# Patient Record
Sex: Female | Born: 1950 | Race: Black or African American | Hispanic: No | Marital: Married | Smoking: Former smoker
Health system: Southern US, Community
[De-identification: ages and names within clinical notes are randomized; demographics above are authoritative.]

## PROBLEM LIST (undated history)

## (undated) ENCOUNTER — Emergency Department (HOSPITAL_COMMUNITY): Payer: Medicare Other

## (undated) DIAGNOSIS — M1990 Unspecified osteoarthritis, unspecified site: Secondary | ICD-10-CM

## (undated) DIAGNOSIS — I10 Essential (primary) hypertension: Secondary | ICD-10-CM

## (undated) DIAGNOSIS — E119 Type 2 diabetes mellitus without complications: Secondary | ICD-10-CM

## (undated) HISTORY — PX: EYE SURGERY: SHX253

---

## 1998-08-13 ENCOUNTER — Encounter: Admission: RE | Admit: 1998-08-13 | Discharge: 1998-08-13 | Payer: Self-pay | Admitting: *Deleted

## 1998-09-18 ENCOUNTER — Other Ambulatory Visit: Admission: RE | Admit: 1998-09-18 | Discharge: 1998-09-18 | Payer: Self-pay | Admitting: *Deleted

## 1998-11-25 ENCOUNTER — Ambulatory Visit (HOSPITAL_COMMUNITY): Admission: RE | Admit: 1998-11-25 | Discharge: 1998-11-25 | Payer: Self-pay | Admitting: *Deleted

## 1998-11-25 ENCOUNTER — Encounter: Payer: Self-pay | Admitting: *Deleted

## 1999-10-06 ENCOUNTER — Other Ambulatory Visit: Admission: RE | Admit: 1999-10-06 | Discharge: 1999-10-06 | Payer: Self-pay | Admitting: *Deleted

## 2000-01-21 ENCOUNTER — Encounter: Payer: Self-pay | Admitting: *Deleted

## 2000-01-21 ENCOUNTER — Ambulatory Visit (HOSPITAL_COMMUNITY): Admission: RE | Admit: 2000-01-21 | Discharge: 2000-01-21 | Payer: Self-pay | Admitting: *Deleted

## 2001-02-02 ENCOUNTER — Encounter: Payer: Self-pay | Admitting: Unknown Physician Specialty

## 2001-02-02 ENCOUNTER — Ambulatory Visit (HOSPITAL_COMMUNITY): Admission: RE | Admit: 2001-02-02 | Discharge: 2001-02-02 | Payer: Self-pay | Admitting: Unknown Physician Specialty

## 2001-07-31 ENCOUNTER — Other Ambulatory Visit: Admission: RE | Admit: 2001-07-31 | Discharge: 2001-07-31 | Payer: Self-pay | Admitting: *Deleted

## 2002-09-26 ENCOUNTER — Encounter: Admission: RE | Admit: 2002-09-26 | Discharge: 2002-09-26 | Payer: Self-pay | Admitting: Psychiatry

## 2002-10-01 ENCOUNTER — Encounter: Admission: RE | Admit: 2002-10-01 | Discharge: 2002-10-01 | Payer: Self-pay | Admitting: Psychiatry

## 2002-11-05 ENCOUNTER — Encounter: Admission: RE | Admit: 2002-11-05 | Discharge: 2002-11-05 | Payer: Self-pay | Admitting: Psychiatry

## 2002-11-21 ENCOUNTER — Encounter: Admission: RE | Admit: 2002-11-21 | Discharge: 2002-11-21 | Payer: Self-pay | Admitting: Psychiatry

## 2004-09-29 ENCOUNTER — Ambulatory Visit (HOSPITAL_COMMUNITY): Admission: RE | Admit: 2004-09-29 | Discharge: 2004-09-29 | Payer: Self-pay | Admitting: Internal Medicine

## 2007-03-31 ENCOUNTER — Ambulatory Visit (HOSPITAL_COMMUNITY): Admission: RE | Admit: 2007-03-31 | Discharge: 2007-03-31 | Payer: Self-pay | Admitting: Family Medicine

## 2008-04-02 ENCOUNTER — Ambulatory Visit (HOSPITAL_COMMUNITY): Admission: RE | Admit: 2008-04-02 | Discharge: 2008-04-02 | Payer: Self-pay | Admitting: Family Medicine

## 2008-06-27 ENCOUNTER — Emergency Department (HOSPITAL_COMMUNITY): Admission: EM | Admit: 2008-06-27 | Discharge: 2008-06-27 | Payer: Self-pay | Admitting: Emergency Medicine

## 2009-04-03 ENCOUNTER — Ambulatory Visit (HOSPITAL_COMMUNITY): Admission: RE | Admit: 2009-04-03 | Discharge: 2009-04-03 | Payer: Self-pay | Admitting: Family Medicine

## 2010-05-11 ENCOUNTER — Ambulatory Visit (HOSPITAL_COMMUNITY): Admission: RE | Admit: 2010-05-11 | Discharge: 2010-05-11 | Payer: Self-pay | Admitting: Internal Medicine

## 2011-06-24 ENCOUNTER — Other Ambulatory Visit (HOSPITAL_COMMUNITY): Payer: Self-pay | Admitting: *Deleted

## 2011-06-24 DIAGNOSIS — Z1231 Encounter for screening mammogram for malignant neoplasm of breast: Secondary | ICD-10-CM

## 2011-07-07 ENCOUNTER — Ambulatory Visit (HOSPITAL_COMMUNITY)
Admission: RE | Admit: 2011-07-07 | Discharge: 2011-07-07 | Disposition: A | Discharge status: Home / Self Care | Source: Ambulatory Visit | Attending: Family Medicine | Admitting: Family Medicine

## 2011-07-07 DIAGNOSIS — Z1231 Encounter for screening mammogram for malignant neoplasm of breast: Secondary | ICD-10-CM

## 2011-09-15 ENCOUNTER — Emergency Department (HOSPITAL_COMMUNITY)
Admission: EM | Admit: 2011-09-15 | Discharge: 2011-09-15 | Disposition: A | Discharge status: Home / Self Care | Attending: Emergency Medicine | Admitting: Emergency Medicine

## 2011-09-15 DIAGNOSIS — M25569 Pain in unspecified knee: Secondary | ICD-10-CM | POA: Insufficient documentation

## 2011-09-15 DIAGNOSIS — I10 Essential (primary) hypertension: Secondary | ICD-10-CM | POA: Insufficient documentation

## 2011-09-15 DIAGNOSIS — E7800 Pure hypercholesterolemia, unspecified: Secondary | ICD-10-CM | POA: Insufficient documentation

## 2011-09-15 DIAGNOSIS — E119 Type 2 diabetes mellitus without complications: Secondary | ICD-10-CM | POA: Insufficient documentation

## 2012-08-07 ENCOUNTER — Other Ambulatory Visit (HOSPITAL_COMMUNITY): Payer: Self-pay | Admitting: Family Medicine

## 2012-08-07 DIAGNOSIS — Z1231 Encounter for screening mammogram for malignant neoplasm of breast: Secondary | ICD-10-CM

## 2012-08-22 ENCOUNTER — Ambulatory Visit (HOSPITAL_COMMUNITY): Attending: Family Medicine

## 2012-09-15 ENCOUNTER — Ambulatory Visit (HOSPITAL_COMMUNITY)
Admission: RE | Admit: 2012-09-15 | Discharge: 2012-09-15 | Disposition: A | Discharge status: Home / Self Care | Source: Ambulatory Visit | Attending: Family Medicine | Admitting: Family Medicine

## 2012-09-15 DIAGNOSIS — Z1231 Encounter for screening mammogram for malignant neoplasm of breast: Secondary | ICD-10-CM | POA: Insufficient documentation

## 2013-08-07 ENCOUNTER — Other Ambulatory Visit (HOSPITAL_COMMUNITY): Payer: Self-pay | Admitting: *Deleted

## 2013-08-07 DIAGNOSIS — Z1231 Encounter for screening mammogram for malignant neoplasm of breast: Secondary | ICD-10-CM

## 2013-09-17 ENCOUNTER — Ambulatory Visit (HOSPITAL_COMMUNITY)
Admission: RE | Admit: 2013-09-17 | Discharge: 2013-09-17 | Disposition: A | Discharge status: Home / Self Care | Source: Ambulatory Visit | Attending: Family Medicine | Admitting: Family Medicine

## 2013-09-17 DIAGNOSIS — Z1231 Encounter for screening mammogram for malignant neoplasm of breast: Secondary | ICD-10-CM

## 2013-12-12 DIAGNOSIS — E785 Hyperlipidemia, unspecified: Secondary | ICD-10-CM | POA: Insufficient documentation

## 2013-12-12 DIAGNOSIS — I10 Essential (primary) hypertension: Secondary | ICD-10-CM | POA: Insufficient documentation

## 2014-09-16 ENCOUNTER — Other Ambulatory Visit: Payer: Self-pay | Admitting: Family Medicine

## 2014-09-16 DIAGNOSIS — Z1231 Encounter for screening mammogram for malignant neoplasm of breast: Secondary | ICD-10-CM

## 2014-09-24 ENCOUNTER — Ambulatory Visit (HOSPITAL_COMMUNITY)
Admission: RE | Admit: 2014-09-24 | Discharge: 2014-09-24 | Disposition: A | Discharge status: Home / Self Care | Source: Ambulatory Visit | Attending: Family Medicine | Admitting: Family Medicine

## 2014-09-24 DIAGNOSIS — Z1231 Encounter for screening mammogram for malignant neoplasm of breast: Secondary | ICD-10-CM | POA: Diagnosis not present

## 2014-12-16 DIAGNOSIS — Z720 Tobacco use: Secondary | ICD-10-CM | POA: Insufficient documentation

## 2015-03-14 ENCOUNTER — Emergency Department (HOSPITAL_COMMUNITY)

## 2015-03-14 ENCOUNTER — Emergency Department (HOSPITAL_COMMUNITY)
Admission: EM | Admit: 2015-03-14 | Discharge: 2015-03-14 | Disposition: A | Discharge status: Home / Self Care | Attending: Emergency Medicine | Admitting: Emergency Medicine

## 2015-03-14 ENCOUNTER — Encounter (HOSPITAL_COMMUNITY): Payer: Self-pay | Admitting: Emergency Medicine

## 2015-03-14 DIAGNOSIS — Z720 Tobacco use: Secondary | ICD-10-CM | POA: Insufficient documentation

## 2015-03-14 DIAGNOSIS — J320 Chronic maxillary sinusitis: Secondary | ICD-10-CM | POA: Diagnosis not present

## 2015-03-14 DIAGNOSIS — Z79899 Other long term (current) drug therapy: Secondary | ICD-10-CM | POA: Insufficient documentation

## 2015-03-14 DIAGNOSIS — Z8739 Personal history of other diseases of the musculoskeletal system and connective tissue: Secondary | ICD-10-CM | POA: Diagnosis not present

## 2015-03-14 DIAGNOSIS — I10 Essential (primary) hypertension: Secondary | ICD-10-CM | POA: Insufficient documentation

## 2015-03-14 DIAGNOSIS — Z7951 Long term (current) use of inhaled steroids: Secondary | ICD-10-CM | POA: Diagnosis not present

## 2015-03-14 DIAGNOSIS — R079 Chest pain, unspecified: Secondary | ICD-10-CM | POA: Diagnosis not present

## 2015-03-14 DIAGNOSIS — E119 Type 2 diabetes mellitus without complications: Secondary | ICD-10-CM | POA: Diagnosis not present

## 2015-03-14 HISTORY — DX: Type 2 diabetes mellitus without complications: E11.9

## 2015-03-14 HISTORY — DX: Unspecified osteoarthritis, unspecified site: M19.90

## 2015-03-14 HISTORY — DX: Essential (primary) hypertension: I10

## 2015-03-14 LAB — I-STAT TROPONIN, ED: Troponin i, poc: 0 ng/mL (ref 0.00–0.08)

## 2015-03-14 LAB — BASIC METABOLIC PANEL
Anion gap: 7 (ref 5–15)
BUN: 16 mg/dL (ref 6–23)
CO2: 24 mmol/L (ref 19–32)
Calcium: 8.8 mg/dL (ref 8.4–10.5)
Chloride: 104 mmol/L (ref 96–112)
Creatinine, Ser: 0.73 mg/dL (ref 0.50–1.10)
GFR calc Af Amer: >90 mL/min (ref >90)
GFR calc non Af Amer: 88 mL/min — ABNORMAL LOW (ref >90)
Glucose, Bld: 170 mg/dL — ABNORMAL HIGH (ref 70–99)
Potassium: 3.8 mmol/L (ref 3.5–5.1)
Sodium: 135 mmol/L (ref 135–145)

## 2015-03-14 LAB — CBC
HCT: 38.7 % (ref 36.0–46.0)
Hemoglobin: 12.1 g/dL (ref 12.0–15.0)
MCH: 27.3 pg (ref 26.0–34.0)
MCHC: 31.3 g/dL (ref 30.0–36.0)
MCV: 87.4 fL (ref 78.0–100.0)
Platelets: 261 10*3/uL (ref 150–400)
RBC: 4.43 MIL/uL (ref 3.87–5.11)
RDW: 14.8 % (ref 11.5–15.5)
WBC: 5.5 10*3/uL (ref 4.0–10.5)

## 2015-03-14 MED ORDER — FAMOTIDINE 20 MG PO TABS: 20.0000 mg | ORAL_TABLET | Freq: Two times a day (BID) | ORAL | Status: DC

## 2015-03-14 MED ORDER — AMOXICILLIN 500 MG PO CAPS: 1000.0000 mg | ORAL_CAPSULE | Freq: Two times a day (BID) | ORAL | Status: DC

## 2015-03-14 NOTE — ED Notes (Addendum)
Pt c/o central chest pain radiating to abdomen and back starting around 0730 this morning. Describes pain as "burning." Denies GERD but says she has "gas all the time." C/o dizziness, lightheadedness, "pain in right ear going to right eye" associated with the chest pain. Not on blood thinners, denies cardiac hx/DVT/strokes. Denies SOB with chest pain or currently. Denies N/V/D. Patient is in NAD. RR even/unlabored.

## 2015-03-14 NOTE — ED Provider Notes (Signed)
CSN: [id number]     Arrival date & time 03/14/15  1146 History   First MD Initiated Contact with Patient 03/14/15 1703     Chief Complaint  Patient presents with  . Chest Pain     (Consider location/radiation/quality/duration/timing/severity/associated sxs/prior Treatment) HPI Comments: The patient is a 64 year old female who has had several episodes of a burning in her chest today. This lasts approximately 1 minute or less, occurs when she is sitting, does not occur with exertion and of note the patient has no history of exertional symptoms despite walking frequently and writing the exercise bike at the gym frequently. She will exercise for up to 45 minutes without any symptoms. She has never seen a cardiologist, she does have hypertension, hypercholesterolemia and mild diabetes. She used to smoke cigarettes but stopped about 3 weeks ago, she denies swelling of the lower extremities or any other complaints, she is not having any symptoms at this time.  Patient is a 64 y.o. female presenting with chest pain. The history is provided by the patient.  Chest Pain   Past Medical History  Diagnosis Date  . Diabetes mellitus without complication   . Hypertension   . Arthritis    Past Surgical History  Procedure Laterality Date  . Eye surgery     History reviewed. No pertinent family history. History  Substance Use Topics  . Smoking status: Current Every Day Smoker -- 0.45 packs/day    Types: Cigarettes  . Smokeless tobacco: Not on file  . Alcohol Use: No   OB History    No data available     Review of Systems  Cardiovascular: Positive for chest pain.  All other systems reviewed and are negative.     Allergies  Aspirin; Azithromycin; and Codeine  Home Medications   Prior to Admission medications   Medication Sig Start Date End Date Taking? Authorizing Provider  amLODipine (NORVASC) 10 MG tablet Take 10 mg by mouth daily.   Yes Historical Provider, MD  cetirizine (ZYRTEC)  10 MG chewable tablet Chew 10 mg by mouth daily.   Yes Historical Provider, MD  Difluprednate 0.05 % EMUL Place 1 drop into the right eye 3 (three) times daily.   Yes Historical Provider, MD  ezetimibe (ZETIA) 10 MG tablet Take 10 mg by mouth at bedtime.   Yes Historical Provider, MD  fluticasone (FLONASE) 50 MCG/ACT nasal spray Place 1 spray into both nostrils daily.   Yes Historical Provider, MD  glipiZIDE-metformin (METAGLIP) 2.5-500 MG per tablet Take 2 tablets by mouth 2 (two) times daily before a meal.   Yes Historical Provider, MD  latanoprost (XALATAN) 0.005 % ophthalmic solution Place 1 drop into both eyes at bedtime.   Yes Historical Provider, MD  pioglitazone (ACTOS) 30 MG tablet Take 30 mg by mouth daily.   Yes Historical Provider, MD  quinapril (ACCUPRIL) 40 MG tablet Take 40 mg by mouth daily.   Yes Historical Provider, MD  simvastatin (ZOCOR) 20 MG tablet Take 20 mg by mouth at bedtime.   Yes Historical Provider, MD  amoxicillin (AMOXIL) 500 MG capsule Take 2 capsules (1,000 mg total) by mouth 2 (two) times daily. 03/14/15   Noemi Chapel, MD  famotidine (PEPCID) 20 MG tablet Take 1 tablet (20 mg total) by mouth 2 (two) times daily. 03/14/15   Noemi Chapel, MD   BP 150/67 mmHg  Pulse 75  Temp(Src) 97.9 F (36.6 C) (Oral)  Resp 16  SpO2 99% Physical Exam  Constitutional: She appears well-developed and  well-nourished. No distress.  HENT:  Head: Normocephalic and atraumatic.  Mouth/Throat: Oropharynx is clear and moist. No oropharyngeal exudate.  Mild tenderness to palpation over maxillary and frontal sinuses  Eyes: Conjunctivae and EOM are normal. Pupils are equal, round, and reactive to light. Right eye exhibits no discharge. Left eye exhibits no discharge. No scleral icterus.  Neck: Normal range of motion. Neck supple. No JVD present. No thyromegaly present.  Cardiovascular: Normal rate, regular rhythm, normal heart sounds and intact distal pulses.  Exam reveals no gallop and no  friction rub.   No murmur heard. Pulmonary/Chest: Effort normal and breath sounds normal. No respiratory distress. She has no wheezes. She has no rales.  Abdominal: Soft. Bowel sounds are normal. She exhibits no distension and no mass. There is no tenderness.  Musculoskeletal: Normal range of motion. She exhibits no edema or tenderness.  Lymphadenopathy:    She has no cervical adenopathy.  Neurological: She is alert. Coordination normal.  Skin: Skin is warm and dry. No rash noted. No erythema.  Psychiatric: She has a normal mood and affect. Her behavior is normal.  Nursing note and vitals reviewed.   ED Course  Procedures (including critical care time) Labs Review Labs Reviewed  BASIC METABOLIC PANEL - Abnormal; Notable for the following:    Glucose, Bld 170 (*)    GFR calc non Af Amer 88 (*)    All other components within normal limits  CBC  I-STAT TROPOININ, ED    Imaging Review Dg Chest 2 View  03/14/2015   CLINICAL DATA:  Mid chest pain radiating to the abdomen and back which began approximately 5 hr ago, described as a burning pain. Associated dizziness. Current history of hypertension and diabetes. Current smoker.  EXAM: CHEST  2 VIEW  COMPARISON:  None.  FINDINGS: Cardiac silhouette normal in size. Thoracic aorta atherosclerotic. Hilar and mediastinal contours otherwise unremarkable. Mildly prominent bronchovascular markings diffusely and mild central peribronchial thickening. Lungs otherwise clear. No localized airspace consolidation. No pleural effusions. No pneumothorax. Normal pulmonary vascularity. Visualized bony thorax intact.  IMPRESSION: Mild changes of bronchitis and/or asthma which may be acute or chronic. No acute cardiopulmonary disease otherwise.   Electronically Signed   By: Evangeline Dakin M.D.   On: 03/14/2015 13:17     EKG Interpretation   Date/Time:  Friday March 14 2015 11:59:32 EDT Ventricular Rate:  79 PR Interval:  172 QRS Duration: 80 QT Interval:   400 QTC Calculation: 458 R Axis:   84 Text Interpretation:  Normal sinus rhythm Normal ECG No old tracing to  compare Confirmed by Zakir Henner  MD, [place] ([id number]) on 03/14/2015 5:07:25 PM      MDM   Final diagnoses:  Chest pain, unspecified chest pain type  Maxillary sinusitis, unspecified chronicity    The patient is very well-appearing, very normal EKG, vital signs are totally unremarkable. Labs showed no signs of elevation of troponin, no signs of ischemia, no renal dysfunction and no anemia. The patient may have an aspect of acid reflux, we'll start on Pepcid, will also start on baby aspirin as this is nonspecific chest pain and she has multiple risk factors for heart disease. She can follow-up in the community setting, she will have her doctor refer her to cardiologist, I will also give further cardiologist phone number. She also likely has some sinusitis. She already takes Zyrtec and Flonase   Meds given in ED:  Medications - No data to display  Discharge Medication List as of 03/14/2015  6:05 PM    START taking these medications   Details  amoxicillin (AMOXIL) 500 MG capsule Take 2 capsules (1,000 mg total) by mouth 2 (two) times daily., Starting 03/14/2015, Until Discontinued, Print    famotidine (PEPCID) 20 MG tablet Take 1 tablet (20 mg total) by mouth 2 (two) times daily., Starting 03/14/2015, Until Discontinued, Print            Noemi Chapel, MD 03/16/15 [phone]

## 2015-03-14 NOTE — Discharge Instructions (Signed)
Take aspirin daily (81mg ).  pepcid twice daily  Call your doctor for referral to cardiology for stress test if you continue to have chest pain but return to the ER if it gets worse.

## 2015-04-23 DIAGNOSIS — R809 Proteinuria, unspecified: Secondary | ICD-10-CM | POA: Insufficient documentation

## 2015-04-23 DIAGNOSIS — E1129 Type 2 diabetes mellitus with other diabetic kidney complication: Secondary | ICD-10-CM | POA: Insufficient documentation

## 2016-01-20 ENCOUNTER — Other Ambulatory Visit: Payer: Self-pay

## 2016-01-20 DIAGNOSIS — Z1231 Encounter for screening mammogram for malignant neoplasm of breast: Secondary | ICD-10-CM

## 2016-01-30 ENCOUNTER — Ambulatory Visit
Admission: RE | Admit: 2016-01-30 | Discharge: 2016-01-30 | Disposition: A | Discharge status: Home / Self Care | Payer: Medicare Other | Source: Ambulatory Visit

## 2016-01-30 DIAGNOSIS — Z1231 Encounter for screening mammogram for malignant neoplasm of breast: Secondary | ICD-10-CM

## 2016-11-10 DIAGNOSIS — N952 Postmenopausal atrophic vaginitis: Secondary | ICD-10-CM | POA: Insufficient documentation

## 2016-11-17 ENCOUNTER — Other Ambulatory Visit: Payer: Self-pay | Admitting: Family Medicine

## 2016-11-17 DIAGNOSIS — E2839 Other primary ovarian failure: Secondary | ICD-10-CM

## 2016-11-24 ENCOUNTER — Ambulatory Visit: Payer: Medicare Other | Admitting: *Deleted

## 2017-02-02 ENCOUNTER — Encounter: Payer: Medicare Other | Attending: Family Medicine | Admitting: Registered"

## 2017-02-02 DIAGNOSIS — E119 Type 2 diabetes mellitus without complications: Secondary | ICD-10-CM

## 2017-02-02 DIAGNOSIS — Z6826 Body mass index (BMI) 26.0-26.9, adult: Secondary | ICD-10-CM | POA: Diagnosis not present

## 2017-02-02 DIAGNOSIS — Z713 Dietary counseling and surveillance: Secondary | ICD-10-CM | POA: Insufficient documentation

## 2017-02-02 NOTE — Patient Instructions (Signed)
Plan:  Continue to eat 3 meals plus snack each day Aim for 2-3 Carb Choices per meal (15-30 grams) +/- 1 either way  Aim for 0-1 Carbs per snack if hungry  Include lean protein in moderation with your meals and snacks Be mindful of portion sizes and pay attention to hunger and satiety cues  Read nutrition labels and choose foods with whole grains listed as first ingredient. Limit saturated fats Continue checking BG as directed by MD  Continue taking Metformin as directed by MD  Doristine Devoid choice to cut back on smoking with goal of quitting

## 2017-02-02 NOTE — Progress Notes (Signed)
Diabetes Self-Management Education  Visit Type: First/Initial  Appt. Start Time: 0930 Appt. End Time: 1100  02/02/2017  Ms. Lindsey Williamson, identified by name and date of birth, is a 66 y.o. female with a diagnosis of Diabetes: Type 2.   ASSESSMENT Patient reports that she would like to know what foods and how much she can eat. She is afraid of low blood sugar. She reports that her physician recently changed her medication from glipizine-metformin to straight metformin which I believe should help with reducing her symptoms of low blood sugar.   Height '5\' 6"'$  (1.676 m), weight 163 lb 9.6 oz (74.2 kg). Body mass index is 26.41 kg/m.      Diabetes Self-Management Education - 02/02/17 0947      Visit Information   Visit Type First/Initial     Initial Visit   Diabetes Type Type 2   Are you currently following a meal plan? No   Are you taking your medications as prescribed? Yes   Date Diagnosed 1985     Health Coping   How would you rate your overall health? Fair     Psychosocial Assessment   Patient Belief/Attitude about Diabetes Afraid   What is the last grade level you completed in school? 33IR     Complications   Last HgB A1C per patient/outside source 7 %  per pt   How often do you check your blood sugar? 1-2 times/day  1x/day before breakfast   Fasting Blood glucose range (mg/dL) 70-129   Number of hypoglycemic episodes per month 3   Can you tell when your blood sugar is low? No   What do you do if your blood sugar is low? glass of OJ   Number of hyperglycemic episodes per week 3   Can you tell when your blood sugar is high? Yes  dizzy   What do you do if your blood sugar is high? water   Have you had a dilated eye exam in the past 12 months? Yes   Have you had a dental exam in the past 12 months? No   Are you checking your feet? Yes   How many days per week are you checking your feet? 5     Dietary Intake   Breakfast 2 eggs, bacon, coffee w splenda and  sugar-free hazelnut creamer OR waffle and [place] bacon or sausage coffee   Snack (morning) none   Lunch left overs OR tuna fish, and onions, celery   Snack (afternoon) chips OR popcorn   Secretary/administrator (with skin), green vegetable OR fish with peppers, tomato, mushrooms   Snack (evening) popcorn  gets hungry at 10:30 pm   Beverage(s) water, 1 c coffee, occassionally diet pepsi     Exercise   Exercise Type ADL's   How many days per week to you exercise? 0   How many minutes per day do you exercise? 0   Total minutes per week of exercise 0     Patient Education   Previous Diabetes Education No   Nutrition management  Role of diet in the treatment of diabetes and the relationship between the three main macronutrients and blood glucose level;Carbohydrate counting;Meal options for control of blood glucose level and chronic complications.     Individualized Goals (developed by patient)   Nutrition General guidelines for healthy choices and portions discussed;Follow meal plan discussed   Reducing Risk stop smoking     Outcomes   Expected Outcomes Demonstrated interest in learning. Expect positive  outcomes   Future DMSE 4-6 wks   Program Status Not Completed      Individualized Plan for Diabetes Self-Management Training:   Learning Objective:  Patient will have a greater understanding of diabetes self-management. Patient education plan is to attend individual and/or group sessions per assessed needs and concerns.   Plan:   Patient Instructions  Plan:  Continue to eat 3 meals plus snack each day Aim for 2-3 Carb Choices per meal (15-30 grams) +/- 1 either way  Aim for 0-1 Carbs per snack if hungry  Include lean protein in moderation with your meals and snacks Be mindful of portion sizes and pay attention to hunger and satiety cues  Read nutrition labels and choose foods with whole grains listed as first ingredient. Limit saturated fats Continue checking BG as directed by MD   Continue taking Metformin as directed by MD  Great choice to cut back on smoking with goal of quitting     Expected Outcomes:  Demonstrated interest in learning. Expect positive outcomes  Education material provided: Meal plan card, My Plate, Snack sheet and Carbohydrate counting sheet  If problems or questions, patient to contact team via:  Phone and Email  Future DSME appointment: 4-6 wks

## 2017-02-24 ENCOUNTER — Other Ambulatory Visit: Payer: Self-pay | Admitting: Family Medicine

## 2017-02-24 DIAGNOSIS — Z1231 Encounter for screening mammogram for malignant neoplasm of breast: Secondary | ICD-10-CM

## 2017-03-16 ENCOUNTER — Ambulatory Visit: Payer: Medicare Other | Admitting: Registered"

## 2017-03-22 ENCOUNTER — Ambulatory Visit
Admission: RE | Admit: 2017-03-22 | Discharge: 2017-03-22 | Disposition: A | Discharge status: Home / Self Care | Payer: Medicare Other | Source: Ambulatory Visit | Attending: Family Medicine | Admitting: Family Medicine

## 2017-03-22 ENCOUNTER — Ambulatory Visit: Payer: Medicare Other

## 2017-03-22 DIAGNOSIS — E2839 Other primary ovarian failure: Secondary | ICD-10-CM

## 2017-03-22 DIAGNOSIS — Z1231 Encounter for screening mammogram for malignant neoplasm of breast: Secondary | ICD-10-CM

## 2017-05-09 DIAGNOSIS — M8580 Other specified disorders of bone density and structure, unspecified site: Secondary | ICD-10-CM | POA: Insufficient documentation

## 2017-11-11 DIAGNOSIS — M1990 Unspecified osteoarthritis, unspecified site: Secondary | ICD-10-CM | POA: Insufficient documentation

## 2018-03-16 ENCOUNTER — Other Ambulatory Visit: Payer: Self-pay | Admitting: Family Medicine

## 2018-03-16 DIAGNOSIS — Z139 Encounter for screening, unspecified: Secondary | ICD-10-CM

## 2018-04-13 ENCOUNTER — Ambulatory Visit
Admission: RE | Admit: 2018-04-13 | Discharge: 2018-04-13 | Disposition: A | Discharge status: Home / Self Care | Payer: Medicare Other | Source: Ambulatory Visit | Attending: Family Medicine | Admitting: Family Medicine

## 2018-04-13 DIAGNOSIS — Z139 Encounter for screening, unspecified: Secondary | ICD-10-CM

## 2018-09-25 DIAGNOSIS — J329 Chronic sinusitis, unspecified: Secondary | ICD-10-CM | POA: Insufficient documentation

## 2018-09-25 DIAGNOSIS — G8929 Other chronic pain: Secondary | ICD-10-CM | POA: Insufficient documentation

## 2018-10-16 DIAGNOSIS — J343 Hypertrophy of nasal turbinates: Secondary | ICD-10-CM | POA: Insufficient documentation

## 2018-10-16 DIAGNOSIS — G500 Trigeminal neuralgia: Secondary | ICD-10-CM | POA: Insufficient documentation

## 2019-05-24 ENCOUNTER — Other Ambulatory Visit: Payer: Self-pay | Admitting: Family Medicine

## 2019-05-24 DIAGNOSIS — Z1231 Encounter for screening mammogram for malignant neoplasm of breast: Secondary | ICD-10-CM

## 2019-07-10 ENCOUNTER — Ambulatory Visit: Payer: Medicare Other

## 2019-08-24 DIAGNOSIS — M79672 Pain in left foot: Secondary | ICD-10-CM | POA: Insufficient documentation

## 2019-09-04 ENCOUNTER — Other Ambulatory Visit: Payer: Self-pay

## 2019-09-04 ENCOUNTER — Ambulatory Visit
Admission: RE | Admit: 2019-09-04 | Discharge: 2019-09-04 | Disposition: A | Discharge status: Home / Self Care | Payer: Medicare Other | Source: Ambulatory Visit | Attending: Family Medicine | Admitting: Family Medicine

## 2019-09-04 DIAGNOSIS — Z1231 Encounter for screening mammogram for malignant neoplasm of breast: Secondary | ICD-10-CM

## 2019-12-18 DIAGNOSIS — F330 Major depressive disorder, recurrent, mild: Secondary | ICD-10-CM | POA: Insufficient documentation

## 2020-01-28 ENCOUNTER — Encounter: Payer: Self-pay | Admitting: Allergy and Immunology

## 2020-01-28 ENCOUNTER — Ambulatory Visit (INDEPENDENT_AMBULATORY_CARE_PROVIDER_SITE_OTHER): Payer: Medicare Other | Admitting: Allergy and Immunology

## 2020-01-28 ENCOUNTER — Other Ambulatory Visit: Payer: Self-pay

## 2020-01-28 VITALS — BP 162/84 | HR 82 | Temp 97.6°F | Resp 18 | Ht 65.0 in | Wt 181.6 lb

## 2020-01-28 DIAGNOSIS — J3089 Other allergic rhinitis: Secondary | ICD-10-CM | POA: Diagnosis not present

## 2020-01-28 DIAGNOSIS — R05 Cough: Secondary | ICD-10-CM | POA: Diagnosis not present

## 2020-01-28 DIAGNOSIS — R059 Cough, unspecified: Secondary | ICD-10-CM | POA: Insufficient documentation

## 2020-01-28 DIAGNOSIS — H1013 Acute atopic conjunctivitis, bilateral: Secondary | ICD-10-CM | POA: Diagnosis not present

## 2020-01-28 DIAGNOSIS — H1010 Acute atopic conjunctivitis, unspecified eye: Secondary | ICD-10-CM | POA: Insufficient documentation

## 2020-01-28 MED ORDER — OLOPATADINE HCL 0.1 % OP SOLN: 1.0000 [drp] | Freq: Two times a day (BID) | OPHTHALMIC | 1 refills | Status: DC

## 2020-01-28 MED ORDER — MONTELUKAST SODIUM 10 MG PO TABS: 10.0000 mg | ORAL_TABLET | Freq: Every day | ORAL | 1 refills | Status: DC

## 2020-01-28 MED ORDER — AZELASTINE-FLUTICASONE 137-50 MCG/ACT NA SUSP: 1.0000 | Freq: Two times a day (BID) | NASAL | 1 refills | Status: DC

## 2020-01-28 MED ORDER — GUAIFENESIN ER 600 MG PO TB12: ORAL_TABLET | ORAL | 1 refills | Status: DC

## 2020-01-28 NOTE — Addendum Note (Signed)
Addended by: Chip Boer R on: 01/28/2020 05:11 PM   Modules accepted: Orders

## 2020-01-28 NOTE — Assessment & Plan Note (Signed)
The patient's history and physical examination suggest upper airway cough syndrome.  Spirometry today reveals normal ventilatory function. We will aggressively treat postnasal drainage and evaluate results.  Treatment plan as outlined above.  Tobacco cessation has been discussed and encouraged.  If the coughing persists or progresses despite this plan, we will evaluate further.

## 2020-01-28 NOTE — Assessment & Plan Note (Signed)
   Aeroallergen avoidance measures have been discussed and provided in written form.  A prescription has been provided for azelastine/fluticasone nasal spray, 1 spray per nostril twice daily as needed. Proper nasal spray technique has been discussed and demonstrated.  Nasal saline lavage (NeilMed) has been recommended as needed and prior to medicated nasal sprays along with instructions for proper administration.  For thick post nasal drainage, add guaifenesin [phone] mg (Mucinex)  twice daily as needed with adequate hydration as discussed.  Saved benefit from cetirizine (Zyrtec) or montelukast (Singulair) these medications may be discontinued.

## 2020-01-28 NOTE — Assessment & Plan Note (Signed)
   Treatment plan as outlined above for allergic rhinitis.  A prescription has been provided for Pataday, one drop per eye daily as needed.  I have also recommended eye lubricant drops (i.e., Natural Tears) as needed. 

## 2020-01-28 NOTE — Progress Notes (Signed)
New Patient Note  RE: Lindsey Williamson MRN: [id number] DOB: May 11, 1951 Date of Office Visit: 01/28/2020  Referring provider: Katherina Mires, MD Primary care provider: Katherina Mires, MD  Chief Complaint: Allergic Rhinitis  and Cough   History of present illness: Lindsey Williamson is a 69 y.o. female seen today in consultation requested by Lindsey Obey, MD.  She complains of nasal congestion, rhinorrhea, sneezing fits, thick postnasal drainage, nasal pruritus, ocular pruritus, lacrimation, and sinus pressure.  She states that the postnasal drainage often times irritates her throat causing coughing.  These symptoms occur year around but are most frequent and severe when the heat comes on in her apartment.  She describes the cough is nonproductive and worse at nighttime.  She denies chest tightness, dyspnea, and wheezing.  She has a 15-pack-year history and is currently attempting to cut back on cigarette smoking.  She reports that she has been taking fluticasone nasal spray, montelukast, and cetirizine "forever" without perceived benefit.  She was treated with antibiotics for a sinus infection this past week.  She has no history of symptoms consistent with eczema or food allergies.   Assessment and plan: Allergic rhinitis with a predominant nonallergic component.  Aeroallergen avoidance measures have been discussed and provided in written form.  A prescription has been provided for azelastine/fluticasone nasal spray, 1 spray per nostril twice daily as needed. Proper nasal spray technique has been discussed and demonstrated.  Nasal saline lavage (NeilMed) has been recommended as needed and prior to medicated nasal sprays along with instructions for proper administration.  For thick post nasal drainage, add guaifenesin [phone] mg (Mucinex)  twice daily as needed with adequate hydration as discussed.  Saved benefit from cetirizine (Zyrtec) or montelukast (Singulair) these medications may be  discontinued.  Allergic conjunctivitis  Treatment plan as outlined above for allergic rhinitis.  A prescription has been provided for Pataday, one drop per eye daily as needed.  I have also recommended eye lubricant drops (i.e., Natural Tears) as needed.  Cough The patient's history and physical examination suggest upper airway cough syndrome.  Spirometry today reveals normal ventilatory function. We will aggressively treat postnasal drainage and evaluate results.  Treatment plan as outlined above.  Tobacco cessation has been discussed and encouraged.  If the coughing persists or progresses despite this plan, we will evaluate further.   Meds ordered this encounter  Medications  . Azelastine-Fluticasone 137-50 MCG/ACT SUSP    Sig: Place 1 spray into the nose 2 (two) times daily.    Dispense:  69 g    Refill:  1  . olopatadine (PATANOL) 0.1 % ophthalmic solution    Sig: Place 1 drop into both eyes 2 (two) times daily.    Dispense:  15 mL    Refill:  1  . montelukast (SINGULAIR) 10 MG tablet    Sig: Take 1 tablet (10 mg total) by mouth at bedtime.    Dispense:  90 tablet    Refill:  1  . guaiFENesin (MUCINEX) 600 MG 12 hr tablet    Sig: 1-2 tablets twice daily as needed.  Please be sure to drink plenty of liquids with this medication.    Dispense:  180 tablet    Refill:  1    Diagnostics: Spirometry:  Normal with an FEV1 of 82% predicted and an FEV1 ratio of 110%. Please see scanned spirometry results for details. Epicutaneous testing: Negative despite a positive histamine control. Intradermal testing: Borderline positive to ragweed mix, cat hair, and  dust mite antigen.  Physical examination: Blood pressure (!) 162/84, pulse 82, temperature 97.6 F (36.4 C), temperature source Temporal, resp. rate 18, height 5\' 5"  (1.651 m), weight 181 lb 9.6 oz (82.4 kg), SpO2 97 %.  General: Alert, interactive, in no acute distress. HEENT: TMs pearly gray, turbinates moderately  edematous with clear discharge, post-pharynx moderately erythematous. Neck: Supple without lymphadenopathy. Lungs: Clear to auscultation without wheezing, rhonchi or rales. CV: Normal S1, S2 without murmurs. Abdomen: Nondistended, nontender. Skin: Warm and dry, without lesions or rashes. Extremities:  No clubbing, cyanosis or edema. Neuro:   Grossly intact.  Review of systems:  Review of systems negative except as noted in HPI / PMHx or noted below: Review of Systems  Constitutional: Negative.   HENT: Negative.   Eyes: Negative.   Respiratory: Negative.   Cardiovascular: Negative.   Gastrointestinal: Negative.   Genitourinary: Negative.   Musculoskeletal: Negative.   Skin: Negative.   Neurological: Negative.   Endo/Heme/Allergies: Negative.   Psychiatric/Behavioral: Negative.     Past medical history:  Past Medical History:  Diagnosis Date  . Arthritis   . Diabetes mellitus without complication ([place])   . Hypertension     Past surgical history:  Past Surgical History:  Procedure Laterality Date  . EYE SURGERY      Family history: History reviewed. No pertinent family history.  Social history: Social History   Socioeconomic History  . Marital status: Married    Spouse name: Not on file  . Number of children: Not on file  . Years of education: Not on file  . Highest education level: Not on file  Occupational History  . Not on file  Tobacco Use  . Smoking status: Current Every Day Smoker    Packs/day: 0.45    Types: Cigarettes  . Smokeless tobacco: Never Used  Substance and Sexual Activity  . Alcohol use: No  . Drug use: Never  . Sexual activity: Not on file  Other Topics Concern  . Not on file  Social History Narrative  . Not on file   Social Determinants of Health   Financial Resource Strain:   . Difficulty of Paying Living Expenses: Not on file  Food Insecurity:   . Worried About Charity fundraiser in the Last Year: Not on file  . Ran Out of  Food in the Last Year: Not on file  Transportation Needs:   . Lack of Transportation (Medical): Not on file  . Lack of Transportation (Non-Medical): Not on file  Physical Activity:   . Days of Exercise per Week: Not on file  . Minutes of Exercise per Session: Not on file  Stress:   . Feeling of Stress : Not on file  Social Connections:   . Frequency of Communication with Friends and Family: Not on file  . Frequency of Social Gatherings with Friends and Family: Not on file  . Attends Religious Services: Not on file  . Active Member of Clubs or Organizations: Not on file  . Attends Archivist Meetings: Not on file  . Marital Status: Not on file  Intimate Partner Violence:   . Fear of Current or Ex-Partner: Not on file  . Emotionally Abused: Not on file  . Physically Abused: Not on file  . Sexually Abused: Not on file    Environmental History: The patient lives in a 70 year old apartment with carpeting throughout and central air/heat.  There is no known mold/water damage in the home.  There are no pets  in the home.  He smokes cigarettes with a 15-pack-year history.  Current Outpatient Medications  Medication Sig Dispense Refill  . amLODipine (NORVASC) 10 MG tablet Take 10 mg by mouth daily.    . cetirizine (ZYRTEC) 10 MG chewable tablet Chew 10 mg by mouth daily.    . Difluprednate 0.05 % EMUL Place 1 drop into the right eye 3 (three) times daily.    Marland Kitchen ezetimibe (ZETIA) 10 MG tablet Take 10 mg by mouth at bedtime.    . famotidine (PEPCID) 20 MG tablet Take 1 tablet (20 mg total) by mouth 2 (two) times daily. 30 tablet 0  . fluticasone (FLONASE) 50 MCG/ACT nasal spray Place 1 spray into both nostrils daily.    . meloxicam (MOBIC) 15 MG tablet Take by mouth.    . metFORMIN (GLUCOPHAGE) 1000 MG tablet Take 1,000 mg by mouth 2 (two) times daily with a meal.    . nortriptyline (PAMELOR) 10 MG capsule Take by mouth.    Marland Kitchen omeprazole (PRILOSEC) 20 MG capsule     . pioglitazone  (ACTOS) 30 MG tablet Take 30 mg by mouth daily.    . pravastatin (PRAVACHOL) 40 MG tablet Take 40 mg by mouth daily.    . quinapril (ACCUPRIL) 40 MG tablet Take 40 mg by mouth daily.    . simvastatin (ZOCOR) 20 MG tablet Take 20 mg by mouth at bedtime.    . Azelastine-Fluticasone 137-50 MCG/ACT SUSP Place 1 spray into the nose 2 (two) times daily. 69 g 1  . guaiFENesin (MUCINEX) 600 MG 12 hr tablet 1-2 tablets twice daily as needed.  Please be sure to drink plenty of liquids with this medication. 180 tablet 1  . montelukast (SINGULAIR) 10 MG tablet Take 1 tablet (10 mg total) by mouth at bedtime. 90 tablet 1  . olopatadine (PATANOL) 0.1 % ophthalmic solution Place 1 drop into both eyes 2 (two) times daily. 15 mL 1   No current facility-administered medications for this visit.    Known medication allergies: Allergies  Allergen Reactions  . Aspirin Palpitations  . Azithromycin Palpitations  . Codeine Palpitations    I appreciate the opportunity to take part in Jermya's care. Please do not hesitate to contact me with questions.  Sincerely,   R. Edgar Frisk, MD

## 2020-01-28 NOTE — Patient Instructions (Addendum)
Allergic rhinitis with a predominant nonallergic component.  Aeroallergen avoidance measures have been discussed and provided in written form.  A prescription has been provided for azelastine/fluticasone nasal spray, 1 spray per nostril twice daily as needed. Proper nasal spray technique has been discussed and demonstrated.  Nasal saline lavage (NeilMed) has been recommended as needed and prior to medicated nasal sprays along with instructions for proper administration.  For thick post nasal drainage, add guaifenesin [phone] mg (Mucinex)  twice daily as needed with adequate hydration as discussed.  Saved benefit from cetirizine (Zyrtec) or montelukast (Singulair) these medications may be discontinued.  Allergic conjunctivitis  Treatment plan as outlined above for allergic rhinitis.  A prescription has been provided for Pataday, one drop per eye daily as needed.  I have also recommended eye lubricant drops (i.e., Natural Tears) as needed.  Cough The patient's history and physical examination suggest upper airway cough syndrome.  Spirometry today reveals normal ventilatory function. We will aggressively treat postnasal drainage and evaluate results.  Treatment plan as outlined above.  Tobacco cessation has been discussed and encouraged.  If the coughing persists or progresses despite this plan, we will evaluate further.   Return in about 3 months (around 04/26/2020), or if symptoms worsen or fail to improve.  Control of House Dust Mite Allergen  House dust mites play a major role in allergic asthma and rhinitis.  They occur in environments with high humidity wherever human skin, the food for dust mites is found. High levels have been detected in dust obtained from mattresses, pillows, carpets, upholstered furniture, bed covers, clothes and soft toys.  The principal allergen of the house dust mite is found in its feces.  A gram of dust may contain 1,000 mites and 250,000 fecal  particles.  Mite antigen is easily measured in the air during house cleaning activities.    1. Encase mattresses, including the box spring, and pillow, in an air tight cover.  Seal the zipper end of the encased mattresses with wide adhesive tape. 2. Wash the bedding in water of 130 degrees Farenheit weekly.  Avoid cotton comforters/quilts and flannel bedding: the most ideal bed covering is the dacron comforter. 3. Remove all upholstered furniture from the bedroom. 4. Remove carpets, carpet padding, rugs, and non-washable window drapes from the bedroom.  Wash drapes weekly or use plastic window coverings. 5. Remove all non-washable stuffed toys from the bedroom.  Wash stuffed toys weekly. 6. Have the room cleaned frequently with a vacuum cleaner and a damp dust-mop.  The patient should not be in a room which is being cleaned and should wait 1 hour after cleaning before going into the room. 7. Close and seal all heating outlets in the bedroom.  Otherwise, the room will become filled with dust-laden air.  An electric heater can be used to heat the room. 8. Reduce indoor humidity to less than 50%.  Do not use a humidifier.  Reducing Pollen Exposure  The American Academy of Allergy, Asthma and Immunology suggests the following steps to reduce your exposure to pollen during allergy seasons.    1. Do not hang sheets or clothing out to dry; pollen may collect on these items. 2. Do not mow lawns or spend time around freshly cut grass; mowing stirs up pollen. 3. Keep windows closed at night.  Keep car windows closed while driving. 4. Minimize morning activities outdoors, a time when pollen counts are usually at their highest. 5. Stay indoors as much as possible when pollen counts  or humidity is high and on windy days when pollen tends to remain in the air longer. 6. Use air conditioning when possible.  Many air conditioners have filters that trap the pollen spores. 7. Use a HEPA room air filter to remove  pollen form the indoor air you breathe.   Control of Dog or Cat Allergen  Avoidance is the best way to manage a dog or cat allergy. If you have a dog or cat and are allergic to dog or cats, consider removing the dog or cat from the home. If you have a dog or cat but don't want to find it a new home, or if your family wants a pet even though someone in the household is allergic, here are some strategies that may help keep symptoms at bay:  1. Keep the pet out of your bedroom and restrict it to only a few rooms. Be advised that keeping the dog or cat in only one room will not limit the allergens to that room. 2. Don't pet, hug or kiss the dog or cat; if you do, wash your hands with soap and water. 3. High-efficiency particulate air (HEPA) cleaners run continuously in a bedroom or living room can reduce allergen levels over time. 4. Place electrostatic material sheet in the air inlet vent in the bedroom. 5. Regular use of a high-efficiency vacuum cleaner or a central vacuum can reduce allergen levels. 6. Giving your dog or cat a bath at least once a week can reduce airborne allergen.

## 2020-01-29 MED ORDER — FLUTICASONE PROPIONATE 50 MCG/ACT NA SUSP: 2.0000 | Freq: Two times a day (BID) | NASAL | 5 refills | Status: DC | PRN

## 2020-01-29 MED ORDER — AZELASTINE HCL 0.1 % NA SOLN: 2.0000 | Freq: Two times a day (BID) | NASAL | 5 refills | Status: DC | PRN

## 2020-01-29 NOTE — Addendum Note (Signed)
Addended by: Herbie Drape on: 01/29/2020 10:19 AM   Modules accepted: Orders

## 2020-01-30 ENCOUNTER — Other Ambulatory Visit: Payer: Self-pay | Admitting: *Deleted

## 2020-02-17 ENCOUNTER — Ambulatory Visit: Payer: Medicare Other | Attending: Internal Medicine

## 2020-02-17 DIAGNOSIS — Z23 Encounter for immunization: Secondary | ICD-10-CM | POA: Insufficient documentation

## 2020-02-17 NOTE — Progress Notes (Signed)
   Covid-19 Vaccination Clinic  Name:  Lindsey Williamson    MRN: [id number] DOB: 1951-04-21  02/17/2020  [name] was observed post Covid-19 immunization for 15 minutes without incidence. She was provided with Vaccine Information Sheet and instruction to access the V-Safe system.   [name] was instructed to call 911 with any severe reactions post vaccine: Marland Kitchen Difficulty breathing  . Swelling of your face and throat  . A fast heartbeat  . A bad rash all over your body  . Dizziness and weakness    Immunizations Administered    Name Date Dose VIS Date Route   Pfizer COVID-19 Vaccine 02/17/2020  9:23 AM 0.3 mL 12/07/2019 Intramuscular   Manufacturer: [employer]   Lot: [id number]   [place]: [address]-7

## 2020-03-12 ENCOUNTER — Ambulatory Visit: Payer: Medicare Other | Attending: Internal Medicine

## 2020-03-12 DIAGNOSIS — Z23 Encounter for immunization: Secondary | ICD-10-CM

## 2020-03-12 NOTE — Progress Notes (Signed)
   Covid-19 Vaccination Clinic  Name:  Lindsey Williamson    MRN: [id number] DOB: October 29, 1951  03/12/2020  Lindsey Williamson was observed post Covid-19 immunization for 15 minutes without incident. She was provided with Vaccine Information Sheet and instruction to access the V-Safe system.   Lindsey Williamson was instructed to call 911 with any severe reactions post vaccine: Marland Kitchen Difficulty breathing  . Swelling of face and throat  . A fast heartbeat  . A bad rash all over body  . Dizziness and weakness   Immunizations Administered    Name Date Dose VIS Date Route   Pfizer COVID-19 Vaccine 03/12/2020  9:12 AM 0.3 mL 12/07/2019 Intramuscular   Manufacturer: [employer]   Lot: [id number]   [place]: [address]-3

## 2020-04-21 ENCOUNTER — Other Ambulatory Visit: Payer: Self-pay | Admitting: Family Medicine

## 2020-04-21 DIAGNOSIS — M8580 Other specified disorders of bone density and structure, unspecified site: Secondary | ICD-10-CM

## 2020-04-28 ENCOUNTER — Encounter: Payer: Self-pay | Admitting: Allergy and Immunology

## 2020-04-28 ENCOUNTER — Other Ambulatory Visit: Payer: Self-pay

## 2020-04-28 ENCOUNTER — Ambulatory Visit (INDEPENDENT_AMBULATORY_CARE_PROVIDER_SITE_OTHER): Payer: Medicare Other | Admitting: Allergy and Immunology

## 2020-04-28 VITALS — BP 130/82 | HR 76 | Temp 97.1°F | Resp 16

## 2020-04-28 DIAGNOSIS — J3089 Other allergic rhinitis: Secondary | ICD-10-CM | POA: Diagnosis not present

## 2020-04-28 DIAGNOSIS — H1013 Acute atopic conjunctivitis, bilateral: Secondary | ICD-10-CM | POA: Diagnosis not present

## 2020-04-28 DIAGNOSIS — R05 Cough: Secondary | ICD-10-CM | POA: Diagnosis not present

## 2020-04-28 DIAGNOSIS — R059 Cough, unspecified: Secondary | ICD-10-CM

## 2020-04-28 MED ORDER — FLUTICASONE PROPIONATE 50 MCG/ACT NA SUSP: 1.0000 | Freq: Every day | NASAL | 5 refills | Status: DC | PRN

## 2020-04-28 MED ORDER — AZELASTINE HCL 0.1 % NA SOLN: 2.0000 | Freq: Two times a day (BID) | NASAL | 5 refills | Status: DC | PRN

## 2020-04-28 MED ORDER — MUCINEX 600 MG PO TB12: ORAL_TABLET | ORAL | 1 refills | Status: DC

## 2020-04-28 NOTE — Progress Notes (Signed)
Follow-up Note  RE: HOLY BATTENFIELD MRN: [id number] DOB: 1951/11/12 Date of Office Visit: 04/28/2020  Primary care provider: Katherina Mires, MD Referring provider: Katherina Mires, MD  History of present illness: Lindsey Williamson is a 69 y.o. female with mixed rhinoconjunctivitis and history of persistent cough presenting today for follow-up.  She was previously seen in this clinic for her initial evaluation on January 28, 2020.  She reports that in the interval since her previous visit her nasal and ocular symptoms have improved.  Particularly in comparison with last spring, her allergy symptoms are well controlled.  She reports she still experiences occasional cough from postnasal drainage, however states that over-the-counter Mucinex is too expensive.  Assessment and plan: Allergic rhinitis with a predominant nonallergic component.  Continue appropriate aeroallergen avoidance measures  Continue azelastine/fluticasone nasal spray, 1 spray per nostril twice daily as needed.   Continue nasal saline lavage (NeilMed) as needed.  A prescription has been provided for guaifenesin 400 mg every 4-6 hours if needed.  Cough Upper airway cough syndrome.  Treatment plan as outlined above.   Meds ordered this encounter  Medications  . fluticasone (FLONASE) 50 MCG/ACT nasal spray    Sig: Place 1-2 sprays into both nostrils daily as needed for allergies or rhinitis.    Dispense:  16 g    Refill:  5  . azelastine (ASTELIN) 0.1 % nasal spray    Sig: Place 2 sprays into both nostrils 2 (two) times daily as needed for rhinitis.    Dispense:  30 mL    Refill:  5  . guaiFENesin (MUCINEX) 600 MG 12 hr tablet    Sig: 1-2 tablets twice daily as needed.  Please be sure to drink plenty of liquids with this medication.    Dispense:  180 tablet    Refill:  1    Physical examination: Blood pressure 130/82, pulse 76, temperature (!) 97.1 F (36.2 C), temperature source Temporal, resp. rate  16, SpO2 98 %.  General: Alert, interactive, in no acute distress. HEENT: TMs pearly gray, turbinates mildly edematous without discharge, post-pharynx unremarkable. Neck: Supple without lymphadenopathy. Lungs: Clear to auscultation without wheezing, rhonchi or rales. CV: Normal S1, S2 without murmurs. Skin: Warm and dry, without lesions or rashes.  The following portions of the patient's history were reviewed and updated as appropriate: allergies, current medications, past family history, past medical history, past social history, past surgical history and problem list.  Current Outpatient Medications  Medication Sig Dispense Refill  . amLODipine (NORVASC) 10 MG tablet Take 10 mg by mouth daily.    Marland Kitchen azelastine (ASTELIN) 0.1 % nasal spray Place 2 sprays into both nostrils 2 (two) times daily as needed for rhinitis. 30 mL 5  . Azelastine-Fluticasone 137-50 MCG/ACT SUSP Place 1 spray into the nose 2 (two) times daily. 69 g 1  . cetirizine (ZYRTEC) 10 MG chewable tablet Chew 10 mg by mouth daily.    . chlorthalidone (HYGROTON) 25 MG tablet Take by mouth.    . Difluprednate 0.05 % EMUL Place 1 drop into the right eye 3 (three) times daily.    Marland Kitchen ezetimibe (ZETIA) 10 MG tablet Take 10 mg by mouth at bedtime.    . famotidine (PEPCID) 20 MG tablet Take 1 tablet (20 mg total) by mouth 2 (two) times daily. 30 tablet 0  . fluticasone (FLONASE) 50 MCG/ACT nasal spray Place 1-2 sprays into both nostrils daily as needed for allergies or rhinitis. 16 g 5  .  guaiFENesin (MUCINEX) 600 MG 12 hr tablet 1-2 tablets twice daily as needed.  Please be sure to drink plenty of liquids with this medication. 180 tablet 1  . metFORMIN (GLUCOPHAGE) 1000 MG tablet Take 1,000 mg by mouth 2 (two) times daily with a meal.    . montelukast (SINGULAIR) 10 MG tablet Take 1 tablet (10 mg total) by mouth at bedtime. 90 tablet 1  . nortriptyline (PAMELOR) 10 MG capsule Take by mouth.    Marland Kitchen olopatadine (PATANOL) 0.1 % ophthalmic  solution Place 1 drop into both eyes 2 (two) times daily. 15 mL 1  . omeprazole (PRILOSEC) 20 MG capsule     . pioglitazone (ACTOS) 30 MG tablet Take 30 mg by mouth daily.    . pravastatin (PRAVACHOL) 40 MG tablet Take 40 mg by mouth daily.    . quinapril (ACCUPRIL) 40 MG tablet Take 40 mg by mouth daily.    . simvastatin (ZOCOR) 20 MG tablet Take 20 mg by mouth at bedtime.     No current facility-administered medications for this visit.    Allergies  Allergen Reactions  . Aspirin Palpitations  . Azithromycin Palpitations  . Codeine Palpitations    I appreciate the opportunity to take part in [name]'s care. Please do not hesitate to contact me with questions.  Sincerely,   R. Edgar Frisk, MD

## 2020-04-28 NOTE — Assessment & Plan Note (Signed)
Upper airway cough syndrome.  Treatment plan as outlined above.

## 2020-04-28 NOTE — Assessment & Plan Note (Signed)
   Continue appropriate aeroallergen avoidance measures  Continue azelastine/fluticasone nasal spray, 1 spray per nostril twice daily as needed.   Continue nasal saline lavage (NeilMed) as needed.  A prescription has been provided for guaifenesin 400 mg every 4-6 hours if needed.

## 2020-04-28 NOTE — Patient Instructions (Addendum)
Allergic rhinitis with a predominant nonallergic component.  Continue appropriate aeroallergen avoidance measures  Continue azelastine/fluticasone nasal spray, 1 spray per nostril twice daily as needed.   Continue nasal saline lavage (NeilMed) as needed.  A prescription has been provided for guaifenesin 400 mg every 4-6 hours if needed.  Cough Upper airway cough syndrome.  Treatment plan as outlined above.   Return in about 6 months (around 10/29/2020).

## 2020-05-19 DIAGNOSIS — R42 Dizziness and giddiness: Secondary | ICD-10-CM | POA: Insufficient documentation

## 2020-07-24 ENCOUNTER — Other Ambulatory Visit: Payer: Medicare Other

## 2020-09-02 ENCOUNTER — Other Ambulatory Visit: Payer: Self-pay | Admitting: Family Medicine

## 2020-09-02 DIAGNOSIS — Z1231 Encounter for screening mammogram for malignant neoplasm of breast: Secondary | ICD-10-CM

## 2020-09-16 ENCOUNTER — Other Ambulatory Visit: Payer: Self-pay

## 2020-09-16 ENCOUNTER — Ambulatory Visit
Admission: RE | Admit: 2020-09-16 | Discharge: 2020-09-16 | Disposition: A | Discharge status: Home / Self Care | Payer: Medicare Other | Source: Ambulatory Visit | Attending: Family Medicine | Admitting: Family Medicine

## 2020-09-16 DIAGNOSIS — Z1231 Encounter for screening mammogram for malignant neoplasm of breast: Secondary | ICD-10-CM

## 2020-12-15 ENCOUNTER — Other Ambulatory Visit: Payer: Medicare Other

## 2021-03-24 ENCOUNTER — Other Ambulatory Visit: Payer: Medicare Other

## 2021-11-26 ENCOUNTER — Encounter: Payer: Self-pay | Admitting: Allergy & Immunology

## 2022-03-03 ENCOUNTER — Ambulatory Visit (INDEPENDENT_AMBULATORY_CARE_PROVIDER_SITE_OTHER): Payer: Medicare Other | Admitting: Pulmonary Disease

## 2022-03-03 ENCOUNTER — Encounter: Payer: Self-pay | Admitting: Pulmonary Disease

## 2022-03-03 ENCOUNTER — Other Ambulatory Visit: Payer: Self-pay

## 2022-03-03 VITALS — BP 144/84 | HR 82 | Temp 98.3°F | Ht 65.0 in | Wt 164.4 lb

## 2022-03-03 DIAGNOSIS — R911 Solitary pulmonary nodule: Secondary | ICD-10-CM

## 2022-03-03 DIAGNOSIS — Z720 Tobacco use: Secondary | ICD-10-CM | POA: Diagnosis not present

## 2022-03-03 NOTE — Patient Instructions (Signed)
Thank you for visiting Dr. Valeta Harms at [facility]. ?Today we recommend the following: ? ?Orders Placed This Encounter  ?Procedures  ? NM PET Image Initial (PI) Skull Base To Thigh (F-18 FDG)  ? CT Super D Chest Wo Contrast  ? Pulmonary Function Test  ? ?See [place] after your PET CT has been complete.  ? ?Return in about 3 weeks (around 03/24/2022) for with Eric Form, NP, or Dr. Valeta Harms. ? ? ? ?Please do your part to reduce the spread of COVID-19.  ?

## 2022-03-03 NOTE — Progress Notes (Signed)
Synopsis: Referred in March 2023 for lung nodules by Katherina Mires, MD  Subjective:   PATIENT ID: Lindsey Williamson GENDER: female DOB: 1951-10-14, MRN: [id number]  Chief Complaint  Patient presents with   Consult    Lung nodules    This is a 71 year old female recently had a abnormal lung cancer screening CT completed at [facility].  Patient's past medical history includes diabetes and hypertension.Patient's lung cancer screening CT was completed on 02/23/2022 which was read as a lung RADS 4B.  Patient was found to have a 23 x 17 mm spiculated right lower lobe lung nodule as well as a left upper lobe 8 mm x 4.5 mm solid lung nodule both were concerned for malignancy.  Unfortunately she is still smoking approximately half a pack a day.  Down from a pack a day.   Past Medical History:  Diagnosis Date   Arthritis    Diabetes mellitus without complication ([place])    Hypertension      Family History  Problem Relation Age of Onset   Allergic rhinitis Sister    Asthma Sister    Angioedema Neg Hx    Atopy Neg Hx    Immunodeficiency Neg Hx    Urticaria Neg Hx    Eczema Neg Hx      Past Surgical History:  Procedure Laterality Date   EYE SURGERY      Social History   Socioeconomic History   Marital status: Married    Spouse name: Not on file   Number of children: Not on file   Years of education: Not on file   Highest education level: Not on file  Occupational History   Not on file  Tobacco Use   Smoking status: Every Day    Packs/day: 0.45    Types: Cigarettes   Smokeless tobacco: Never  Vaping Use   Vaping Use: Never used  Substance and Sexual Activity   Alcohol use: No   Drug use: Never   Sexual activity: Not on file  Other Topics Concern   Not on file  Social History Narrative   Not on file   Social Determinants of Health   Financial Resource Strain: Not on file  Food Insecurity: Not on file  Transportation Needs: Not on file  Physical Activity: Not on file   Stress: Not on file  Social Connections: Not on file  Intimate Partner Violence: Not on file     Allergies  Allergen Reactions   Aspirin Palpitations   Azithromycin Palpitations   Codeine Palpitations     Outpatient Medications Prior to Visit  Medication Sig Dispense Refill   amLODipine (NORVASC) 10 MG tablet Take 10 mg by mouth daily.     azelastine (ASTELIN) 0.1 % nasal spray Place 2 sprays into both nostrils 2 (two) times daily as needed for rhinitis. 30 mL 5   famotidine (PEPCID) 20 MG tablet Take 1 tablet (20 mg total) by mouth 2 (two) times daily. 30 tablet 0   metFORMIN (GLUCOPHAGE) 1000 MG tablet Take 1,000 mg by mouth 2 (two) times daily with a meal.     pravastatin (PRAVACHOL) 40 MG tablet Take 40 mg by mouth daily.     Azelastine-Fluticasone 137-50 MCG/ACT SUSP Place 1 spray into the nose 2 (two) times daily. (Patient not taking: Reported on 03/03/2022) 69 g 1   cetirizine (ZYRTEC) 10 MG chewable tablet Chew 10 mg by mouth daily. (Patient not taking: Reported on 03/03/2022)     chlorthalidone (HYGROTON) 25  MG tablet Take by mouth. (Patient not taking: Reported on 03/03/2022)     Difluprednate 0.05 % EMUL Place 1 drop into the right eye 3 (three) times daily.     ezetimibe (ZETIA) 10 MG tablet Take 10 mg by mouth at bedtime. (Patient not taking: Reported on 03/03/2022)     fluticasone (FLONASE) 50 MCG/ACT nasal spray Place 1-2 sprays into both nostrils daily as needed for allergies or rhinitis. (Patient not taking: Reported on 03/03/2022) 16 g 5   guaiFENesin (MUCINEX) 600 MG 12 hr tablet 1-2 tablets twice daily as needed.  Please be sure to drink plenty of liquids with this medication. (Patient not taking: Reported on 03/03/2022) 180 tablet 1   montelukast (SINGULAIR) 10 MG tablet Take 1 tablet (10 mg total) by mouth at bedtime. (Patient not taking: Reported on 03/03/2022) 90 tablet 1   nortriptyline (PAMELOR) 10 MG capsule Take by mouth. (Patient not taking: Reported on 03/03/2022)      olopatadine (PATANOL) 0.1 % ophthalmic solution Place 1 drop into both eyes 2 (two) times daily. (Patient not taking: Reported on 03/03/2022) 15 mL 1   omeprazole (PRILOSEC) 20 MG capsule  (Patient not taking: Reported on 03/03/2022)     pioglitazone (ACTOS) 30 MG tablet Take 30 mg by mouth daily. (Patient not taking: Reported on 03/03/2022)     quinapril (ACCUPRIL) 40 MG tablet Take 40 mg by mouth daily. (Patient not taking: Reported on 03/03/2022)     simvastatin (ZOCOR) 20 MG tablet Take 20 mg by mouth at bedtime. (Patient not taking: Reported on 03/03/2022)     No facility-administered medications prior to visit.    Review of Systems  Constitutional:  Negative for chills, fever, malaise/fatigue and weight loss.  HENT:  Negative for hearing loss, sore throat and tinnitus.   Eyes:  Negative for blurred vision and double vision.  Respiratory:  Negative for cough, hemoptysis, sputum production, shortness of breath, wheezing and stridor.   Cardiovascular:  Negative for chest pain, palpitations, orthopnea, leg swelling and PND.  Gastrointestinal:  Negative for abdominal pain, constipation, diarrhea, heartburn, nausea and vomiting.  Genitourinary:  Negative for dysuria, hematuria and urgency.  Musculoskeletal:  Negative for joint pain and myalgias.  Skin:  Negative for itching and rash.  Neurological:  Negative for dizziness, tingling, weakness and headaches.  Endo/Heme/Allergies:  Negative for environmental allergies. Does not bruise/bleed easily.  Psychiatric/Behavioral:  Negative for depression. The patient is not nervous/anxious and does not have insomnia.   All other systems reviewed and are negative.   Objective:  Physical Exam Vitals reviewed.  Constitutional:      General: She is not in acute distress.    Appearance: She is well-developed.  HENT:     Head: Normocephalic and atraumatic.  Eyes:     General: No scleral icterus.    Conjunctiva/sclera: Conjunctivae normal.     Pupils:  Pupils are equal, round, and reactive to light.  Neck:     Vascular: No JVD.     Trachea: No tracheal deviation.  Cardiovascular:     Rate and Rhythm: Normal rate and regular rhythm.     Heart sounds: Normal heart sounds. No murmur heard. Pulmonary:     Effort: Pulmonary effort is normal. No tachypnea, accessory muscle usage or respiratory distress.     Breath sounds: No stridor. No wheezing, rhonchi or rales.  Abdominal:     General: There is no distension.     Palpations: Abdomen is soft.     Tenderness:  There is no abdominal tenderness.  Musculoskeletal:        General: No tenderness.     Cervical back: Neck supple.  Lymphadenopathy:     Cervical: No cervical adenopathy.  Skin:    General: Skin is warm and dry.     Capillary Refill: Capillary refill takes less than 2 seconds.     Findings: No rash.  Neurological:     Mental Status: She is alert and oriented to person, place, and time.  Psychiatric:        Behavior: Behavior normal.     Vitals:   03/03/22 0946  BP: (!) 144/84  Pulse: 82  Temp: 98.3 F (36.8 C)  TempSrc: Oral  SpO2: 97%  Weight: 164 lb 6.4 oz (74.6 kg)  Height: 5\' 5"  (1.651 m)   97% on RA BMI Readings from Last 3 Encounters:  03/03/22 27.36 kg/m  01/28/20 30.22 kg/m  02/02/17 26.41 kg/m   Wt Readings from Last 3 Encounters:  03/03/22 164 lb 6.4 oz (74.6 kg)  01/28/20 181 lb 9.6 oz (82.4 kg)  02/02/17 163 lb 9.6 oz (74.2 kg)     CBC    Component Value Date/Time   WBC 5.5 03/14/2015 1211   RBC 4.43 03/14/2015 1211   HGB 12.1 03/14/2015 1211   HCT 38.7 03/14/2015 1211   PLT 261 03/14/2015 1211   MCV 87.4 03/14/2015 1211   MCH 27.3 03/14/2015 1211   MCHC 31.3 03/14/2015 1211   RDW 14.8 03/14/2015 1211    Chest Imaging: CT report reviewed from February 2023. Unable to view images however report visible in epic care everywhere from [facility]. She has 2 nodules of concern a right lower lobe 23 mm nodule and a left upper lobe 8 mm  nodule. The patient's images have been independently reviewed by me.    Pulmonary Functions Testing Results: No flowsheet data found.  FeNO:   Pathology:   Echocardiogram:   Heart Catheterization:     Assessment & Plan:     ICD-10-CM   1. Nodule of lower lobe of right lung  R91.1     2. Left upper lobe pulmonary nodule  R91.1     3. Tobacco abuse  Z72.0       Discussion:  Is a 71 year old female, 2 pulmonary nodules, longstanding history of tobacco abuse.  Plan: I think the next best option would be best evaluated with nuclear medicine pet imaging. We will start by getting a PET scan and go ahead and plan for having a super D completed at the same time. We will start by evaluation and if the PET scan is concerning for malignancy then we can consider navigational bronchoscopy and tissue sampling.  Patient is agreeable to this plan.  Return to clinic in approximately 3 weeks.   Current Outpatient Medications:    amLODipine (NORVASC) 10 MG tablet, Take 10 mg by mouth daily., Disp: , Rfl:    azelastine (ASTELIN) 0.1 % nasal spray, Place 2 sprays into both nostrils 2 (two) times daily as needed for rhinitis., Disp: 30 mL, Rfl: 5   famotidine (PEPCID) 20 MG tablet, Take 1 tablet (20 mg total) by mouth 2 (two) times daily., Disp: 30 tablet, Rfl: 0   metFORMIN (GLUCOPHAGE) 1000 MG tablet, Take 1,000 mg by mouth 2 (two) times daily with a meal., Disp: , Rfl:    pravastatin (PRAVACHOL) 40 MG tablet, Take 40 mg by mouth daily., Disp: , Rfl:    Azelastine-Fluticasone 137-50 MCG/ACT SUSP, Place  1 spray into the nose 2 (two) times daily. (Patient not taking: Reported on 03/03/2022), Disp: 69 g, Rfl: 1   cetirizine (ZYRTEC) 10 MG chewable tablet, Chew 10 mg by mouth daily. (Patient not taking: Reported on 03/03/2022), Disp: , Rfl:    chlorthalidone (HYGROTON) 25 MG tablet, Take by mouth. (Patient not taking: Reported on 03/03/2022), Disp: , Rfl:    Difluprednate 0.05 % EMUL, Place 1 drop  into the right eye 3 (three) times daily., Disp: , Rfl:    ezetimibe (ZETIA) 10 MG tablet, Take 10 mg by mouth at bedtime. (Patient not taking: Reported on 03/03/2022), Disp: , Rfl:    fluticasone (FLONASE) 50 MCG/ACT nasal spray, Place 1-2 sprays into both nostrils daily as needed for allergies or rhinitis. (Patient not taking: Reported on 03/03/2022), Disp: 16 g, Rfl: 5   guaiFENesin (MUCINEX) 600 MG 12 hr tablet, 1-2 tablets twice daily as needed.  Please be sure to drink plenty of liquids with this medication. (Patient not taking: Reported on 03/03/2022), Disp: 180 tablet, Rfl: 1   montelukast (SINGULAIR) 10 MG tablet, Take 1 tablet (10 mg total) by mouth at bedtime. (Patient not taking: Reported on 03/03/2022), Disp: 90 tablet, Rfl: 1   nortriptyline (PAMELOR) 10 MG capsule, Take by mouth. (Patient not taking: Reported on 03/03/2022), Disp: , Rfl:    olopatadine (PATANOL) 0.1 % ophthalmic solution, Place 1 drop into both eyes 2 (two) times daily. (Patient not taking: Reported on 03/03/2022), Disp: 15 mL, Rfl: 1   omeprazole (PRILOSEC) 20 MG capsule, , Disp: , Rfl:    pioglitazone (ACTOS) 30 MG tablet, Take 30 mg by mouth daily. (Patient not taking: Reported on 03/03/2022), Disp: , Rfl:    quinapril (ACCUPRIL) 40 MG tablet, Take 40 mg by mouth daily. (Patient not taking: Reported on 03/03/2022), Disp: , Rfl:    simvastatin (ZOCOR) 20 MG tablet, Take 20 mg by mouth at bedtime. (Patient not taking: Reported on 03/03/2022), Disp: , Rfl:   I spent 62 minutes dedicated to the care of this patient on the date of this encounter to include pre-visit review of records, face-to-face time with the patient discussing conditions above, post visit ordering of testing, clinical documentation with the electronic health record, making appropriate referrals as documented, and communicating necessary findings to members of the patients care team.   Garner Nash, [facility] 03/03/2022 10:02 AM

## 2022-03-17 ENCOUNTER — Ambulatory Visit (HOSPITAL_COMMUNITY)
Admission: RE | Admit: 2022-03-17 | Discharge: 2022-03-17 | Disposition: A | Discharge status: Home / Self Care | Payer: Medicare Other | Source: Ambulatory Visit | Attending: Pulmonary Disease | Admitting: Pulmonary Disease

## 2022-03-17 ENCOUNTER — Other Ambulatory Visit: Payer: Self-pay

## 2022-03-17 DIAGNOSIS — R911 Solitary pulmonary nodule: Secondary | ICD-10-CM | POA: Diagnosis present

## 2022-03-17 DIAGNOSIS — Z720 Tobacco use: Secondary | ICD-10-CM

## 2022-03-17 LAB — GLUCOSE, CAPILLARY: Glucose-Capillary: 100 mg/dL — ABNORMAL HIGH (ref 70–99)

## 2022-03-17 MED ORDER — FLUDEOXYGLUCOSE F - 18 (FDG) INJECTION
8.2000 | Freq: Once | INTRAVENOUS | Status: AC | PRN
  Administered 2022-03-17: 8.6 via INTRAVENOUS

## 2022-03-22 ENCOUNTER — Other Ambulatory Visit: Payer: Self-pay

## 2022-03-22 ENCOUNTER — Encounter: Payer: Self-pay | Admitting: Pulmonary Disease

## 2022-03-22 ENCOUNTER — Ambulatory Visit (INDEPENDENT_AMBULATORY_CARE_PROVIDER_SITE_OTHER): Payer: Medicare Other | Admitting: Pulmonary Disease

## 2022-03-22 VITALS — BP 140/68 | HR 79 | Temp 98.3°F | Ht 64.0 in | Wt 165.0 lb

## 2022-03-22 DIAGNOSIS — Z720 Tobacco use: Secondary | ICD-10-CM

## 2022-03-22 DIAGNOSIS — R911 Solitary pulmonary nodule: Secondary | ICD-10-CM

## 2022-03-22 NOTE — H&P (View-Only) (Signed)
? ?Synopsis: Referred in March 2023 for lung nodules by Katherina Mires, MD ? ?Subjective:  ? ?PATIENT ID: Lindsey Williamson GENDER: female DOB: [birth year], MRN: [id number] ? ?Chief Complaint  ?Patient presents with  ? Follow-up  ?  Patient is here to go over pet results  ? ? ?This is a 71 year old female recently had a abnormal lung cancer screening CT completed at [facility].  Patient's past medical history includes diabetes and hypertension.Patient's lung cancer screening CT was completed on 02/23/2022 which was read as a lung RADS 4B.  Patient was found to have a 23 x 17 mm spiculated right lower lobe lung nodule as well as a left upper lobe 8 mm x 4.5 mm solid lung nodule both were concerned for malignancy.  Unfortunately she is still smoking approximately half a pack a day.  Down from a pack a day. ? ?OV 03/22/2022: Here today for follow-up after recent PET scan imaging.  PET scan imaging reveals hypermetabolic uptake within the right lower lobe nodule.  Additionally there is a smaller focus of metabolic uptake on the medial portion of the right lower lobe concerning for inflammatory versus potential metastatic disease.  There was no obvious nodule associated with that area and its approximately 4 mm in size if there was anything there I reviewed the images on the CT as well as the recent super D.  She has been able to cut down to less than half a pack of cigarettes per day. ? ? ?Past Medical History:  ?Diagnosis Date  ? Arthritis   ? Diabetes mellitus without complication ([place])   ? Hypertension   ?  ? ?Family History  ?Problem Relation Age of Onset  ? Allergic rhinitis Sister   ? Asthma Sister   ? Angioedema Neg Hx   ? Atopy Neg Hx   ? Immunodeficiency Neg Hx   ? Urticaria Neg Hx   ? Eczema Neg Hx   ?  ? ?Past Surgical History:  ?Procedure Laterality Date  ? EYE SURGERY    ? ? ?Social History  ? ?Socioeconomic History  ? Marital status: Married  ?  Spouse name: Not on file  ? Number of children: Not on file  ? Years  of education: Not on file  ? Highest education level: Not on file  ?Occupational History  ? Not on file  ?Tobacco Use  ? Smoking status: Former  ?  Packs/day: 0.45  ?  Types: Cigarettes  ? Smokeless tobacco: Former  ?Vaping Use  ? Vaping Use: Never used  ?Substance and Sexual Activity  ? Alcohol use: No  ? Drug use: Never  ? Sexual activity: Not on file  ?Other Topics Concern  ? Not on file  ?Social History Narrative  ? Not on file  ? ?Social Determinants of Health  ? ?Financial Resource Strain: Not on file  ?Food Insecurity: Not on file  ?Transportation Needs: Not on file  ?Physical Activity: Not on file  ?Stress: Not on file  ?Social Connections: Not on file  ?Intimate Partner Violence: Not on file  ?  ? ?Allergies  ?Allergen Reactions  ? Aspirin Palpitations  ? Azithromycin Palpitations  ? Codeine Palpitations  ?  ? ?Outpatient Medications Prior to Visit  ?Medication Sig Dispense Refill  ? amLODipine (NORVASC) 10 MG tablet Take 10 mg by mouth daily.    ? azelastine (ASTELIN) 0.1 % nasal spray Place 2 sprays into both nostrils 2 (two) times daily as needed for rhinitis. 30 mL 5  ?  Difluprednate 0.05 % EMUL Place 1 drop into the right eye 3 (three) times daily.    ? famotidine (PEPCID) 20 MG tablet Take 1 tablet (20 mg total) by mouth 2 (two) times daily. 30 tablet 0  ? metFORMIN (GLUCOPHAGE) 1000 MG tablet Take 1,000 mg by mouth 2 (two) times daily with a meal.    ? pravastatin (PRAVACHOL) 40 MG tablet Take 40 mg by mouth daily.    ? Azelastine-Fluticasone 137-50 MCG/ACT SUSP Place 1 spray into the nose 2 (two) times daily. (Patient not taking: Reported on 03/03/2022) 69 g 1  ? cetirizine (ZYRTEC) 10 MG chewable tablet Chew 10 mg by mouth daily. (Patient not taking: Reported on 03/03/2022)    ? chlorthalidone (HYGROTON) 25 MG tablet Take by mouth. (Patient not taking: Reported on 03/03/2022)    ? ezetimibe (ZETIA) 10 MG tablet Take 10 mg by mouth at bedtime. (Patient not taking: Reported on 03/03/2022)    ? fluticasone  (FLONASE) 50 MCG/ACT nasal spray Place 1-2 sprays into both nostrils daily as needed for allergies or rhinitis. (Patient not taking: Reported on 03/03/2022) 16 g 5  ? guaiFENesin (MUCINEX) 600 MG 12 hr tablet 1-2 tablets twice daily as needed.  Please be sure to drink plenty of liquids with this medication. (Patient not taking: Reported on 03/03/2022) 180 tablet 1  ? montelukast (SINGULAIR) 10 MG tablet Take 1 tablet (10 mg total) by mouth at bedtime. (Patient not taking: Reported on 03/03/2022) 90 tablet 1  ? nortriptyline (PAMELOR) 10 MG capsule Take by mouth. (Patient not taking: Reported on 03/03/2022)    ? olopatadine (PATANOL) 0.1 % ophthalmic solution Place 1 drop into both eyes 2 (two) times daily. (Patient not taking: Reported on 03/03/2022) 15 mL 1  ? omeprazole (PRILOSEC) 20 MG capsule  (Patient not taking: Reported on 03/03/2022)    ? pioglitazone (ACTOS) 30 MG tablet Take 30 mg by mouth daily. (Patient not taking: Reported on 03/03/2022)    ? quinapril (ACCUPRIL) 40 MG tablet Take 40 mg by mouth daily. (Patient not taking: Reported on 03/03/2022)    ? simvastatin (ZOCOR) 20 MG tablet Take 20 mg by mouth at bedtime. (Patient not taking: Reported on 03/03/2022)    ? ?No facility-administered medications prior to visit.  ? ? ?Review of Systems  ?Constitutional:  Negative for chills, fever, malaise/fatigue and weight loss.  ?HENT:  Negative for hearing loss, sore throat and tinnitus.   ?Eyes:  Negative for blurred vision and double vision.  ?Respiratory:  Negative for cough, hemoptysis, sputum production, shortness of breath, wheezing and stridor.   ?Cardiovascular:  Negative for chest pain, palpitations, orthopnea, leg swelling and PND.  ?Gastrointestinal:  Negative for abdominal pain, constipation, diarrhea, heartburn, nausea and vomiting.  ?Genitourinary:  Negative for dysuria, hematuria and urgency.  ?Musculoskeletal:  Negative for joint pain and myalgias.  ?Skin:  Negative for itching and rash.  ?Neurological:  Negative  for dizziness, tingling, weakness and headaches.  ?Endo/Heme/Allergies:  Negative for environmental allergies. Does not bruise/bleed easily.  ?Psychiatric/Behavioral:  Negative for depression. The patient is not nervous/anxious and does not have insomnia.   ?All other systems reviewed and are negative. ? ? ?Objective:  ?Physical Exam ?Vitals reviewed.  ?Constitutional:   ?   General: She is not in acute distress. ?   Appearance: She is well-developed.  ?HENT:  ?   Head: Normocephalic and atraumatic.  ?Eyes:  ?   General: No scleral icterus. ?   Conjunctiva/sclera: Conjunctivae normal.  ?  Pupils: Pupils are equal, round, and reactive to light.  ?Neck:  ?   Vascular: No JVD.  ?   Trachea: No tracheal deviation.  ?Cardiovascular:  ?   Rate and Rhythm: Normal rate and regular rhythm.  ?   Heart sounds: Normal heart sounds. No murmur heard. ?Pulmonary:  ?   Effort: Pulmonary effort is normal. No tachypnea, accessory muscle usage or respiratory distress.  ?   Breath sounds: No stridor. No wheezing, rhonchi or rales.  ?Abdominal:  ?   General: There is no distension.  ?   Palpations: Abdomen is soft.  ?   Tenderness: There is no abdominal tenderness.  ?Musculoskeletal:     ?   General: No tenderness.  ?   Cervical back: Neck supple.  ?Lymphadenopathy:  ?   Cervical: No cervical adenopathy.  ?Skin: ?   General: Skin is warm and dry.  ?   Capillary Refill: Capillary refill takes less than 2 seconds.  ?   Findings: No rash.  ?Neurological:  ?   Mental Status: She is alert and oriented to person, place, and time.  ?Psychiatric:     ?   Behavior: Behavior normal.  ? ? ? ?Vitals:  ? 03/22/22 0914  ?BP: 140/68  ?Pulse: 79  ?Temp: 98.3 ?F (36.8 ?C)  ?TempSrc: Oral  ?SpO2: 99%  ?Weight: 165 lb (74.8 kg)  ?Height: 5\' 4"  (1.626 m)  ? ?99% on RA ?BMI Readings from Last 3 Encounters:  ?03/22/22 28.32 kg/m?  ?03/03/22 27.36 kg/m?  ?01/28/20 30.22 kg/m?  ? ?Wt Readings from Last 3 Encounters:  ?03/22/22 165 lb (74.8 kg)  ?03/03/22 164  lb 6.4 oz (74.6 kg)  ?01/28/20 181 lb 9.6 oz (82.4 kg)  ? ? ? ?CBC ?   ?Component Value Date/Time  ? WBC 5.5 03/14/2015 1211  ? RBC 4.43 03/14/2015 1211  ? HGB 12.1 03/14/2015 1211  ? HCT 38.7 03/18/2

## 2022-03-22 NOTE — Patient Instructions (Addendum)
Thank you for visiting Dr. Valeta Harms at [facility]. ?Today we recommend the following: ? ?Orders Placed This Encounter  ?Procedures  ? Procedural/ Surgical Case Request: ROBOTIC ASSISTED NAVIGATIONAL BRONCHOSCOPY, VIDEO BRONCHOSCOPY WITH ENDOBRONCHIAL ULTRASOUND  ? Ambulatory referral to Pulmonology  ? ?Bronchoscopy 03/30/2022 ? ?Return in about 15 days (around 04/06/2022) for with Eric Form, NP. ? ? ? ?Please do your part to reduce the spread of COVID-19.  ? ?

## 2022-03-22 NOTE — Progress Notes (Signed)
? ?Synopsis: Referred in March 2023 for lung nodules by Katherina Mires, MD ? ?Subjective:  ? ?PATIENT ID: Lindsey Williamson GENDER: female DOB: 02-17-1951, MRN: [id number] ? ?Chief Complaint  ?Patient presents with  ? Follow-up  ?  Patient is here to go over pet results  ? ? ?This is a 71 year old female recently had a abnormal lung cancer screening CT completed at [facility].  Patient's past medical history includes diabetes and hypertension.Patient's lung cancer screening CT was completed on 02/23/2022 which was read as a lung RADS 4B.  Patient was found to have a 23 x 17 mm spiculated right lower lobe lung nodule as well as a left upper lobe 8 mm x 4.5 mm solid lung nodule both were concerned for malignancy.  Unfortunately she is still smoking approximately half a pack a day.  Down from a pack a day. ? ?OV 03/22/2022: Here today for follow-up after recent PET scan imaging.  PET scan imaging reveals hypermetabolic uptake within the right lower lobe nodule.  Additionally there is a smaller focus of metabolic uptake on the medial portion of the right lower lobe concerning for inflammatory versus potential metastatic disease.  There was no obvious nodule associated with that area and its approximately 4 mm in size if there was anything there I reviewed the images on the CT as well as the recent super D.  She has been able to cut down to less than half a pack of cigarettes per day. ? ? ?Past Medical History:  ?Diagnosis Date  ? Arthritis   ? Diabetes mellitus without complication ([place])   ? Hypertension   ?  ? ?Family History  ?Problem Relation Age of Onset  ? Allergic rhinitis Sister   ? Asthma Sister   ? Angioedema Neg Hx   ? Atopy Neg Hx   ? Immunodeficiency Neg Hx   ? Urticaria Neg Hx   ? Eczema Neg Hx   ?  ? ?Past Surgical History:  ?Procedure Laterality Date  ? EYE SURGERY    ? ? ?Social History  ? ?Socioeconomic History  ? Marital status: Married  ?  Spouse name: Not on file  ? Number of children: Not on file  ? Years  of education: Not on file  ? Highest education level: Not on file  ?Occupational History  ? Not on file  ?Tobacco Use  ? Smoking status: Former  ?  Packs/day: 0.45  ?  Types: Cigarettes  ? Smokeless tobacco: Former  ?Vaping Use  ? Vaping Use: Never used  ?Substance and Sexual Activity  ? Alcohol use: No  ? Drug use: Never  ? Sexual activity: Not on file  ?Other Topics Concern  ? Not on file  ?Social History Narrative  ? Not on file  ? ?Social Determinants of Health  ? ?Financial Resource Strain: Not on file  ?Food Insecurity: Not on file  ?Transportation Needs: Not on file  ?Physical Activity: Not on file  ?Stress: Not on file  ?Social Connections: Not on file  ?Intimate Partner Violence: Not on file  ?  ? ?Allergies  ?Allergen Reactions  ? Aspirin Palpitations  ? Azithromycin Palpitations  ? Codeine Palpitations  ?  ? ?Outpatient Medications Prior to Visit  ?Medication Sig Dispense Refill  ? amLODipine (NORVASC) 10 MG tablet Take 10 mg by mouth daily.    ? azelastine (ASTELIN) 0.1 % nasal spray Place 2 sprays into both nostrils 2 (two) times daily as needed for rhinitis. 30 mL 5  ?  Difluprednate 0.05 % EMUL Place 1 drop into the right eye 3 (three) times daily.    ? famotidine (PEPCID) 20 MG tablet Take 1 tablet (20 mg total) by mouth 2 (two) times daily. 30 tablet 0  ? metFORMIN (GLUCOPHAGE) 1000 MG tablet Take 1,000 mg by mouth 2 (two) times daily with a meal.    ? pravastatin (PRAVACHOL) 40 MG tablet Take 40 mg by mouth daily.    ? Azelastine-Fluticasone 137-50 MCG/ACT SUSP Place 1 spray into the nose 2 (two) times daily. (Patient not taking: Reported on 03/03/2022) 69 g 1  ? cetirizine (ZYRTEC) 10 MG chewable tablet Chew 10 mg by mouth daily. (Patient not taking: Reported on 03/03/2022)    ? chlorthalidone (HYGROTON) 25 MG tablet Take by mouth. (Patient not taking: Reported on 03/03/2022)    ? ezetimibe (ZETIA) 10 MG tablet Take 10 mg by mouth at bedtime. (Patient not taking: Reported on 03/03/2022)    ? fluticasone  (FLONASE) 50 MCG/ACT nasal spray Place 1-2 sprays into both nostrils daily as needed for allergies or rhinitis. (Patient not taking: Reported on 03/03/2022) 16 g 5  ? guaiFENesin (MUCINEX) 600 MG 12 hr tablet 1-2 tablets twice daily as needed.  Please be sure to drink plenty of liquids with this medication. (Patient not taking: Reported on 03/03/2022) 180 tablet 1  ? montelukast (SINGULAIR) 10 MG tablet Take 1 tablet (10 mg total) by mouth at bedtime. (Patient not taking: Reported on 03/03/2022) 90 tablet 1  ? nortriptyline (PAMELOR) 10 MG capsule Take by mouth. (Patient not taking: Reported on 03/03/2022)    ? olopatadine (PATANOL) 0.1 % ophthalmic solution Place 1 drop into both eyes 2 (two) times daily. (Patient not taking: Reported on 03/03/2022) 15 mL 1  ? omeprazole (PRILOSEC) 20 MG capsule  (Patient not taking: Reported on 03/03/2022)    ? pioglitazone (ACTOS) 30 MG tablet Take 30 mg by mouth daily. (Patient not taking: Reported on 03/03/2022)    ? quinapril (ACCUPRIL) 40 MG tablet Take 40 mg by mouth daily. (Patient not taking: Reported on 03/03/2022)    ? simvastatin (ZOCOR) 20 MG tablet Take 20 mg by mouth at bedtime. (Patient not taking: Reported on 03/03/2022)    ? ?No facility-administered medications prior to visit.  ? ? ?Review of Systems  ?Constitutional:  Negative for chills, fever, malaise/fatigue and weight loss.  ?HENT:  Negative for hearing loss, sore throat and tinnitus.   ?Eyes:  Negative for blurred vision and double vision.  ?Respiratory:  Negative for cough, hemoptysis, sputum production, shortness of breath, wheezing and stridor.   ?Cardiovascular:  Negative for chest pain, palpitations, orthopnea, leg swelling and PND.  ?Gastrointestinal:  Negative for abdominal pain, constipation, diarrhea, heartburn, nausea and vomiting.  ?Genitourinary:  Negative for dysuria, hematuria and urgency.  ?Musculoskeletal:  Negative for joint pain and myalgias.  ?Skin:  Negative for itching and rash.  ?Neurological:  Negative  for dizziness, tingling, weakness and headaches.  ?Endo/Heme/Allergies:  Negative for environmental allergies. Does not bruise/bleed easily.  ?Psychiatric/Behavioral:  Negative for depression. The patient is not nervous/anxious and does not have insomnia.   ?All other systems reviewed and are negative. ? ? ?Objective:  ?Physical Exam ?Vitals reviewed.  ?Constitutional:   ?   General: She is not in acute distress. ?   Appearance: She is well-developed.  ?HENT:  ?   Head: Normocephalic and atraumatic.  ?Eyes:  ?   General: No scleral icterus. ?   Conjunctiva/sclera: Conjunctivae normal.  ?  Pupils: Pupils are equal, round, and reactive to light.  ?Neck:  ?   Vascular: No JVD.  ?   Trachea: No tracheal deviation.  ?Cardiovascular:  ?   Rate and Rhythm: Normal rate and regular rhythm.  ?   Heart sounds: Normal heart sounds. No murmur heard. ?Pulmonary:  ?   Effort: Pulmonary effort is normal. No tachypnea, accessory muscle usage or respiratory distress.  ?   Breath sounds: No stridor. No wheezing, rhonchi or rales.  ?Abdominal:  ?   General: There is no distension.  ?   Palpations: Abdomen is soft.  ?   Tenderness: There is no abdominal tenderness.  ?Musculoskeletal:     ?   General: No tenderness.  ?   Cervical back: Neck supple.  ?Lymphadenopathy:  ?   Cervical: No cervical adenopathy.  ?Skin: ?   General: Skin is warm and dry.  ?   Capillary Refill: Capillary refill takes less than 2 seconds.  ?   Findings: No rash.  ?Neurological:  ?   Mental Status: She is alert and oriented to person, place, and time.  ?Psychiatric:     ?   Behavior: Behavior normal.  ? ? ? ?Vitals:  ? 03/22/22 0914  ?BP: 140/68  ?Pulse: 79  ?Temp: 98.3 ?F (36.8 ?C)  ?TempSrc: Oral  ?SpO2: 99%  ?Weight: 165 lb (74.8 kg)  ?Height: 5\' 4"  (1.626 m)  ? ?99% on RA ?BMI Readings from Last 3 Encounters:  ?03/22/22 28.32 kg/m?  ?03/03/22 27.36 kg/m?  ?01/28/20 30.22 kg/m?  ? ?Wt Readings from Last 3 Encounters:  ?03/22/22 165 lb (74.8 kg)  ?03/03/22 164  lb 6.4 oz (74.6 kg)  ?01/28/20 181 lb 9.6 oz (82.4 kg)  ? ? ? ?CBC ?   ?Component Value Date/Time  ? WBC 5.5 03/14/2015 1211  ? RBC 4.43 03/14/2015 1211  ? HGB 12.1 03/14/2015 1211  ? HCT 38.7 03/18/2

## 2022-03-26 ENCOUNTER — Other Ambulatory Visit: Payer: Self-pay | Admitting: Pulmonary Disease

## 2022-03-26 LAB — SARS CORONAVIRUS 2 (TAT 6-24 HRS): SARS Coronavirus 2: NEGATIVE

## 2022-03-29 ENCOUNTER — Encounter (HOSPITAL_COMMUNITY): Payer: Self-pay | Admitting: Pulmonary Disease

## 2022-03-29 ENCOUNTER — Other Ambulatory Visit: Payer: Self-pay

## 2022-03-29 NOTE — Progress Notes (Signed)
PCP - Dr. Doreene Nest ?Cardiologist - denies ?EKG - DOS ?Chest x-ray -  ?ECHO -  ?Cardiac Cath -  ?CPAP -  ?Fasting Blood Sugar:  90s ?Checks Blood Sugar:  1x/day ?Blood Thinner Instructions:  ?Aspirin Instructions:  ?ERAS Protcol - n/a - clears 1145 ?COVID TEST- negative  ? ?Anesthesia review: n/a ? ?------------- ? ?SDW INSTRUCTIONS: ? ?Your procedure is scheduled on Tuesday 4/4. Please report to [facility] Main Entrance "A" at 1215 PM., and check in at the Admitting office. Call this number if you have problems the morning of surgery: [phone] ? ? ?Remember: Do not eat after midnight the night before your surgery ? ?You may drink clear liquids until 1145 the morning of your surgery.   ?Clear liquids allowed are: Water, Non-Citrus Juices (without pulp), Carbonated Beverages, Clear Tea, Black Coffee Only, and Gatorade ?  ?Medications to take morning of surgery with a sip of water include: ?amLODipine (NORVASC)  ? ?** PLEASE check your blood sugar the morning of your surgery when you wake up and every 2 hours until you get to the Short Stay unit. ? ?If your blood sugar is less than 70 mg/dL, you will need to treat for low blood sugar: ?Do not take insulin. ?Treat a low blood sugar (less than 70 mg/dL) with ? cup of clear juice (cranberry or apple), 4 glucose tablets, OR glucose gel. ?Recheck blood sugar in 15 minutes after treatment (to make sure it is greater than 70 mg/dL). If your blood sugar is not greater than 70 mg/dL on recheck, call [phone] for further instructions. ? ? ?As of today, STOP taking any Aspirin (unless otherwise instructed by your surgeon), Aleve, Naproxen, Ibuprofen, Motrin, Advil, Goody's, BC's, all herbal medications, fish oil, and all vitamins. ? ?  ?The Morning of Surgery ?Do not wear jewelry, make-up or nail polish. ?Do not wear lotions, powders, or perfumes, or deodorant ?Do not bring valuables to the hospital. ?[facility] is not responsible for any belongings or valuables. ? ?If  you are a smoker, DO NOT Smoke 24 hours prior to surgery ? ?If you wear a CPAP at night please bring your mask the morning of surgery  ? ?Remember that you must have someone to transport you home after your surgery, and remain with you for 24 hours if you are discharged the same day. ? ?Please bring cases for contacts, glasses, hearing aids, dentures or bridgework because it cannot be worn into surgery.  ? ?Patients discharged the day of surgery will not be allowed to drive home.  ? ?Please shower the NIGHT BEFORE/MORNING OF SURGERY (use antibacterial soap like DIAL soap if possible). Wear comfortable clothes the morning of surgery. Oral Hygiene is also important to reduce your risk of infection.  Remember - BRUSH YOUR TEETH THE MORNING OF SURGERY WITH YOUR REGULAR TOOTHPASTE ? ?Patient denies shortness of breath, fever, cough and chest pain.  ? ? ?   ? ?

## 2022-03-30 ENCOUNTER — Ambulatory Visit (HOSPITAL_BASED_OUTPATIENT_CLINIC_OR_DEPARTMENT_OTHER): Payer: Medicare Other | Admitting: Anesthesiology

## 2022-03-30 ENCOUNTER — Encounter (HOSPITAL_COMMUNITY)
Admission: RE | Disposition: A | Discharge status: Home / Self Care | Payer: Self-pay | Source: Home / Self Care | Attending: Pulmonary Disease

## 2022-03-30 ENCOUNTER — Encounter (HOSPITAL_COMMUNITY): Payer: Self-pay | Admitting: Pulmonary Disease

## 2022-03-30 ENCOUNTER — Ambulatory Visit (HOSPITAL_COMMUNITY): Payer: Medicare Other

## 2022-03-30 ENCOUNTER — Ambulatory Visit (HOSPITAL_COMMUNITY)
Admission: RE | Admit: 2022-03-30 | Discharge: 2022-03-30 | Disposition: A | Discharge status: Home / Self Care | Payer: Medicare Other | Attending: Pulmonary Disease | Admitting: Pulmonary Disease

## 2022-03-30 ENCOUNTER — Ambulatory Visit (HOSPITAL_COMMUNITY): Payer: Medicare Other | Admitting: Anesthesiology

## 2022-03-30 DIAGNOSIS — Z7984 Long term (current) use of oral hypoglycemic drugs: Secondary | ICD-10-CM | POA: Insufficient documentation

## 2022-03-30 DIAGNOSIS — M1990 Unspecified osteoarthritis, unspecified site: Secondary | ICD-10-CM | POA: Insufficient documentation

## 2022-03-30 DIAGNOSIS — I10 Essential (primary) hypertension: Secondary | ICD-10-CM | POA: Diagnosis not present

## 2022-03-30 DIAGNOSIS — C3431 Malignant neoplasm of lower lobe, right bronchus or lung: Secondary | ICD-10-CM | POA: Diagnosis present

## 2022-03-30 DIAGNOSIS — Z87891 Personal history of nicotine dependence: Secondary | ICD-10-CM

## 2022-03-30 DIAGNOSIS — E119 Type 2 diabetes mellitus without complications: Secondary | ICD-10-CM | POA: Insufficient documentation

## 2022-03-30 DIAGNOSIS — R911 Solitary pulmonary nodule: Secondary | ICD-10-CM | POA: Diagnosis present

## 2022-03-30 DIAGNOSIS — F17210 Nicotine dependence, cigarettes, uncomplicated: Secondary | ICD-10-CM | POA: Insufficient documentation

## 2022-03-30 HISTORY — PX: BRONCHIAL NEEDLE ASPIRATION BIOPSY: SHX5106

## 2022-03-30 HISTORY — PX: VIDEO BRONCHOSCOPY WITH ENDOBRONCHIAL ULTRASOUND: SHX6177

## 2022-03-30 HISTORY — PX: FIDUCIAL MARKER PLACEMENT: SHX6858

## 2022-03-30 HISTORY — PX: VIDEO BRONCHOSCOPY WITH RADIAL ENDOBRONCHIAL ULTRASOUND: SHX6849

## 2022-03-30 HISTORY — PX: BRONCHIAL BIOPSY: SHX5109

## 2022-03-30 HISTORY — PX: BRONCHIAL BRUSHINGS: SHX5108

## 2022-03-30 HISTORY — PX: FOREIGN BODY REMOVAL: SHX962

## 2022-03-30 LAB — CBC
HCT: 44 % (ref 36.0–46.0)
Hemoglobin: 13 g/dL (ref 12.0–15.0)
MCH: 26.5 pg (ref 26.0–34.0)
MCHC: 29.5 g/dL — ABNORMAL LOW (ref 30.0–36.0)
MCV: 89.8 fL (ref 80.0–100.0)
Platelets: 295 10*3/uL (ref 150–400)
RBC: 4.9 MIL/uL (ref 3.87–5.11)
RDW: 14.9 % (ref 11.5–15.5)
WBC: 6.7 10*3/uL (ref 4.0–10.5)
nRBC: 0 % (ref 0.0–0.2)

## 2022-03-30 LAB — GLUCOSE, CAPILLARY: Glucose-Capillary: 113 mg/dL — ABNORMAL HIGH (ref 70–99)

## 2022-03-30 SURGERY — BRONCHOSCOPY, WITH BIOPSY USING ELECTROMAGNETIC NAVIGATION
Anesthesia: General | Laterality: Right

## 2022-03-30 MED ORDER — CHLORHEXIDINE GLUCONATE 0.12 % MT SOLN
15.0000 mL | OROMUCOSAL | Status: AC
Start: 2022-03-30 — End: 2022-03-30
  Filled 2022-03-30: qty 15

## 2022-03-30 MED ORDER — ROCURONIUM BROMIDE 10 MG/ML (PF) SYRINGE
PREFILLED_SYRINGE | INTRAVENOUS | Status: DC | PRN
  Administered 2022-03-30: 60 mg via INTRAVENOUS

## 2022-03-30 MED ORDER — LIDOCAINE 2% (20 MG/ML) 5 ML SYRINGE
INTRAMUSCULAR | Status: DC | PRN
  Administered 2022-03-30: 60 mg via INTRAVENOUS

## 2022-03-30 MED ORDER — ESMOLOL HCL 100 MG/10ML IV SOLN
INTRAVENOUS | Status: DC | PRN
  Administered 2022-03-30 (×3): 20 mg via INTRAVENOUS

## 2022-03-30 MED ORDER — SUGAMMADEX SODIUM 200 MG/2ML IV SOLN
INTRAVENOUS | Status: DC | PRN
  Administered 2022-03-30: 200 mg via INTRAVENOUS

## 2022-03-30 MED ORDER — LACTATED RINGERS IV SOLN: INTRAVENOUS | Status: DC

## 2022-03-30 MED ORDER — ONDANSETRON HCL 4 MG/2ML IJ SOLN
INTRAMUSCULAR | Status: DC | PRN
  Administered 2022-03-30: 4 mg via INTRAVENOUS

## 2022-03-30 MED ORDER — INSULIN ASPART 100 UNIT/ML IJ SOLN: 0.0000 [IU] | INTRAMUSCULAR | Status: DC | PRN

## 2022-03-30 MED ORDER — PROPOFOL 10 MG/ML IV BOLUS
INTRAVENOUS | Status: DC | PRN
  Administered 2022-03-30: 50 mg via INTRAVENOUS
  Administered 2022-03-30: 150 mg via INTRAVENOUS

## 2022-03-30 MED ORDER — DEXAMETHASONE SODIUM PHOSPHATE 10 MG/ML IJ SOLN
INTRAMUSCULAR | Status: DC | PRN
  Administered 2022-03-30: 5 mg via INTRAVENOUS

## 2022-03-30 MED ORDER — CHLORHEXIDINE GLUCONATE 0.12 % MT SOLN
OROMUCOSAL | Status: AC
  Administered 2022-03-30: 15 mL via OROMUCOSAL
  Filled 2022-03-30: qty 15

## 2022-03-30 MED ORDER — FENTANYL CITRATE (PF) 100 MCG/2ML IJ SOLN
INTRAMUSCULAR | Status: DC | PRN
  Administered 2022-03-30 (×2): 50 ug via INTRAVENOUS

## 2022-03-30 SURGICAL SUPPLY — 31 items
BRUSH CYTOL CELLEBRITY 1.5X140 (MISCELLANEOUS) IMPLANT
CANISTER SUCT 3000ML PPV (MISCELLANEOUS) ×4 IMPLANT
CONT SPEC 4OZ CLIKSEAL STRL BL (MISCELLANEOUS) ×4 IMPLANT
COVER BACK TABLE 60X90IN (DRAPES) ×4 IMPLANT
COVER DOME SNAP 22 D (MISCELLANEOUS) ×4 IMPLANT
FORCEPS BIOP RJ4 1.8 (CUTTING FORCEPS) IMPLANT
GAUZE SPONGE 4X4 12PLY STRL (GAUZE/BANDAGES/DRESSINGS) ×4 IMPLANT
GLOVE BIO SURGEON STRL SZ7.5 (GLOVE) ×4 IMPLANT
GOWN STRL REUS W/ TWL LRG LVL3 (GOWN DISPOSABLE) ×3 IMPLANT
GOWN STRL REUS W/TWL LRG LVL3 (GOWN DISPOSABLE) ×4
KIT CLEAN ENDO COMPLIANCE (KITS) ×8 IMPLANT
KIT TURNOVER KIT B (KITS) ×4 IMPLANT
MARKER SKIN DUAL TIP RULER LAB (MISCELLANEOUS) ×4 IMPLANT
NDL EBUS SONO TIP PENTAX (NEEDLE) ×3 IMPLANT
NEEDLE EBUS SONO TIP PENTAX (NEEDLE) ×4 IMPLANT
NS IRRIG 1000ML POUR BTL (IV SOLUTION) ×4 IMPLANT
OIL SILICONE PENTAX (PARTS (SERVICE/REPAIRS)) ×4 IMPLANT
PAD ARMBOARD 7.5X6 YLW CONV (MISCELLANEOUS) ×8 IMPLANT
SOL ANTI FOG 6CC (MISCELLANEOUS) ×3 IMPLANT
SOLUTION ANTI FOG 6CC (MISCELLANEOUS) ×1
SYR 20CC LL (SYRINGE) ×8 IMPLANT
SYR 20ML ECCENTRIC (SYRINGE) ×8 IMPLANT
SYR 50ML SLIP (SYRINGE) IMPLANT
SYR 5ML LUER SLIP (SYRINGE) ×4 IMPLANT
TOWEL OR 17X24 6PK STRL BLUE (TOWEL DISPOSABLE) ×4 IMPLANT
TRAP SPECIMEN MUCOUS 40CC (MISCELLANEOUS) IMPLANT
TUBE CONNECTING 20X1/4 (TUBING) ×8 IMPLANT
UNDERPAD 30X30 (UNDERPADS AND DIAPERS) ×4 IMPLANT
VALVE DISPOSABLE (MISCELLANEOUS) ×4 IMPLANT
WATER STERILE IRR 1000ML POUR (IV SOLUTION) ×4 IMPLANT
superlock fiducial marker ×1 IMPLANT

## 2022-03-30 NOTE — Transfer of Care (Signed)
Immediate Anesthesia Transfer of Care Note ? ?Patient: Lindsey Williamson ? ?Procedure(s) Performed: ROBOTIC ASSISTED NAVIGATIONAL BRONCHOSCOPY (Right) ?VIDEO BRONCHOSCOPY WITH ENDOBRONCHIAL ULTRASOUND (Bilateral) ?VIDEO BRONCHOSCOPY WITH RADIAL ENDOBRONCHIAL ULTRASOUND ?BRONCHIAL BIOPSIES ?BRONCHIAL BRUSHINGS ?BRONCHIAL NEEDLE ASPIRATION BIOPSIES ?FIDUCIAL MARKER PLACEMENT ?FOREIGN BODY REMOVAL ? ?Patient Location: PACU ? ?Anesthesia Type:General ? ?Level of Consciousness: awake, alert  and oriented ? ?Airway & Oxygen Therapy: Patient Spontanous Breathing and Patient connected to face mask oxygen ? ?Post-op Assessment: Report given to RN, Post -op Vital signs reviewed and stable and Patient moving all extremities X 4 ? ?Post vital signs: Reviewed and stable ? ?Last Vitals:  ?Vitals Value Taken Time  ?BP    ?Temp    ?Pulse 92 03/30/22 1214  ?Resp 22 03/30/22 1214  ?SpO2 93 % 03/30/22 1214  ?Vitals shown include unvalidated device data. ? ?Last Pain:  ?Vitals:  ? 03/30/22 0935  ?PainSc: 0-No pain  ?   ? ?Patients Stated Pain Goal: 0 (03/30/22 0935) ? ?Complications: No notable events documented. ?

## 2022-03-30 NOTE — Op Note (Signed)
Video Bronchoscopy with Robotic Assisted Bronchoscopic Navigation  ? ?Date of Operation: 03/30/2022  ? ?Pre-op Diagnosis: lung nodule  ? ?Post-op Diagnosis: lung nodule  ? ?Surgeon: Garner Nash, DO  ? ?Assistants: None  ? ?Anesthesia: General endotracheal anesthesia ? ?Operation: Flexible video fiberoptic bronchoscopy with robotic assistance and biopsies. ? ?Estimated Blood Loss: Minimal ? ?Complications: None ? ?Indications and History: ?Lindsey Williamson is a 71 y.o. female with history of lung nodule. The risks, benefits, complications, treatment options and expected outcomes were discussed with the patient.  The possibilities of pneumothorax, pneumonia, reaction to medication, pulmonary aspiration, perforation of a viscus, bleeding, failure to diagnose a condition and creating a complication requiring transfusion or operation were discussed with the patient who freely signed the consent.   ? ?Description of Procedure: ?The patient was seen in the Preoperative Area, was examined and was deemed appropriate to proceed.  The patient was taken to [facility] endoscopy room 3, identified as Lindsey Williamson and the procedure verified as Flexible Video Fiberoptic Bronchoscopy.  A Time Out was held and the above information confirmed.  ? ?Prior to the date of the procedure a high-resolution CT scan of the chest was performed. Utilizing ION software program a virtual tracheobronchial tree was generated to allow the creation of distinct navigation pathways to the patient's parenchymal abnormalities. After being taken to the operating room general anesthesia was initiated and the patient  was orally intubated. The video fiberoptic bronchoscope was introduced via the endotracheal tube and a general inspection was performed which showed normal right and left lung anatomy, aspiration of the bilateral mainstems was completed to remove any remaining secretions. Robotic catheter inserted into patient's endotracheal tube.  ? ?Target #1  Right lower lobe nodule: ?The distinct navigation pathways prepared prior to this procedure were then utilized to navigate to patient's lesion identified on CT scan. The robotic catheter was secured into place and the vision probe was withdrawn.  Lesion location was approximated using fluoroscopy, cone beam CBCT imaging and radial endobronchial ultrasound for peripheral targeting. Under fluoroscopic guidance transbronchial needle brushings, transbronchial needle biopsies, and transbronchial forceps biopsies were performed to be sent for cytology and pathology.  Following tissue sampling we placed a single fiducial under fluoroscopic guidance in the proximity of the lesion. ? ?At the end of the procedure a general airway inspection was performed and there was no evidence of active bleeding. The bronchoscope was removed.  The patient tolerated the procedure well. There was no significant blood loss and there were no obvious complications. A post-procedural chest x-ray is pending. ? ?Samples Target #1: ?1. Transbronchial needle brushings from RLL ?2. Transbronchial Wang needle biopsies from RLL ?3. Transbronchial forceps biopsies from RLL ? ?Plans:  ?The patient will be discharged from the PACU to home when recovered from anesthesia and after chest x-ray is reviewed. We will review the cytology, pathology with the patient when they become available. Outpatient followup will be with Octavio Graves Valentine Barney, DO. ? ?Garner Nash, DO ?[facility] ?03/30/2022 12:15 PM    ? ?

## 2022-03-30 NOTE — Anesthesia Procedure Notes (Signed)
Procedure Name: Intubation ?Date/Time: 03/30/2022 10:45 AM ?Performed by: Mariea Clonts, CRNA ?Pre-anesthesia Checklist: Patient identified, Emergency Drugs available, Suction available and Patient being monitored ?Patient Re-evaluated:Patient Re-evaluated prior to induction ?Oxygen Delivery Method: Circle System Utilized ?Preoxygenation: Pre-oxygenation with 100% oxygen ?Induction Type: IV induction ?Ventilation: Mask ventilation without difficulty ?Laryngoscope Size: Sabra Heck and 2 ?Grade View: Grade I ?Tube type: Oral ?Tube size: 8.5 mm ?Number of attempts: 1 ?Airway Equipment and Method: Stylet and Oral airway ?Placement Confirmation: ETT inserted through vocal cords under direct vision, positive ETCO2 and breath sounds checked- equal and bilateral ?Tube secured with: Tape ?Dental Injury: Teeth and Oropharynx as per pre-operative assessment  ? ? ? ? ?

## 2022-03-30 NOTE — Anesthesia Preprocedure Evaluation (Addendum)
Anesthesia Evaluation  ?Patient identified by MRN, date of birth, ID band ?Patient awake ? ? ? ?Reviewed: ?Allergy & Precautions, H&P , NPO status , Patient's Chart, lab work & pertinent test results, reviewed documented beta blocker date and time  ? ?Airway ?Mallampati: II ? ?TM Distance: >3 FB ?Neck ROM: full ? ? ? Dental ?no notable dental hx. ?(+) Poor Dentition, Chipped, Missing, Loose,  ?  ?Pulmonary ?neg pulmonary ROS, Patient abstained from smoking., former smoker,  ?  ?Pulmonary exam normal ?breath sounds clear to auscultation ? ? ? ? ? ? Cardiovascular ?Exercise Tolerance: Good ?hypertension,  ?Rhythm:regular Rate:Normal ? ? ?  ?Neuro/Psych ?negative neurological ROS ? negative psych ROS  ? GI/Hepatic ?negative GI ROS, Neg liver ROS,   ?Endo/Other  ?diabetes, Oral Hypoglycemic Agents ? Renal/GU ?negative Renal ROS  ?negative genitourinary ?  ?Musculoskeletal ? ?(+) Arthritis , Osteoarthritis,   ? Abdominal ?  ?Peds ? Hematology ?negative hematology ROS ?(+)   ?Anesthesia Other Findings ? ? Reproductive/Obstetrics ?negative OB ROS ? ?  ? ? ? ? ? ? ? ? ? ? ? ? ? ?  ?  ? ? ? ? ? ? ? ?Anesthesia Physical ?Anesthesia Plan ? ?ASA: 3 ? ?Anesthesia Plan: General  ? ?Post-op Pain Management: Minimal or no pain anticipated  ? ?Induction: Intravenous ? ?PONV Risk Score and Plan: 3 and Ondansetron and Dexamethasone ? ?Airway Management Planned: Oral ETT ? ?Additional Equipment: None ? ?Intra-op Plan:  ? ?Post-operative Plan: Extubation in OR ? ?Informed Consent: I have reviewed the patients History and Physical, chart, labs and discussed the procedure including the risks, benefits and alternatives for the proposed anesthesia with the patient or authorized representative who has indicated his/her understanding and acceptance.  ? ? ? ?Dental Advisory Given ? ?Plan Discussed with: CRNA and Anesthesiologist ? ?Anesthesia Plan Comments: ( ? ?)  ? ? ? ? ? ? ?Anesthesia Quick Evaluation ? ?

## 2022-03-30 NOTE — Discharge Instructions (Signed)
Flexible Bronchoscopy, Care After ?This sheet gives you information about how to care for yourself after your test. Your doctor may also give you more specific instructions. If you have problems or questions, contact your doctor. ?Follow these instructions at home: ?Eating and drinking ?Do not eat or drink anything (not even water) for 2 hours after your test, or until your numbing medicine (local anesthetic) wears off. ?When your numbness is gone and your cough and gag reflexes have come back, you may: ?Eat only soft foods. ?Slowly drink liquids. ?The day after the test, go back to your normal diet. ?Driving ?Do not drive for 24 hours if you were given a medicine to help you relax (sedative). ?Do not drive or use heavy machinery while taking prescription pain medicine. ?General instructions ? ?Take over-the-counter and prescription medicines only as told by your doctor. ?Return to your normal activities as told. Ask what activities are safe for you. ?Do not use any products that have nicotine or tobacco in them. This includes cigarettes and e-cigarettes. If you need help quitting, ask your doctor. ?Keep all follow-up visits as told by your doctor. This is important. It is very important if you had a tissue sample (biopsy) taken. ?Get help right away if: ?You have shortness of breath that gets worse. ?You get light-headed. ?You feel like you are going to pass out (faint). ?You have chest pain. ?You cough up: ?More than a little blood. ?More blood than before. ?Summary ?Do not eat or drink anything (not even water) for 2 hours after your test, or until your numbing medicine wears off. ?Do not use cigarettes. Do not use e-cigarettes. ?Get help right away if you have chest pain. ? ?This information is not intended to replace advice given to you by your health care provider. Make sure you discuss any questions you have with your health care provider. ?Document Released: 10/10/2009 Document Revised: 11/25/2017 Document  Reviewed: 12/31/2016 ?Elsevier Patient Education ? [place]. ? ?

## 2022-03-30 NOTE — Interval H&P Note (Signed)
History and Physical Interval Note: ? ?03/30/2022 ?10:25 AM ? ?Lindsey Williamson  has presented today for surgery, with the diagnosis of Lung module.  The various methods of treatment have been discussed with the patient and family. After consideration of risks, benefits and other options for treatment, the patient has consented to  Procedure(s) with comments: ?ROBOTIC ASSISTED NAVIGATIONAL BRONCHOSCOPY (Right) - ION w/ CIOS ?VIDEO BRONCHOSCOPY WITH ENDOBRONCHIAL ULTRASOUND (Bilateral) as a surgical intervention.  The patient's history has been reviewed, patient examined, no change in status, stable for surgery.  I have reviewed the patient's chart and labs.  Questions were answered to the patient's satisfaction.   ? ? ?Octavio Graves Destin Vinsant ? ? ?

## 2022-03-31 ENCOUNTER — Telehealth: Payer: Self-pay | Admitting: Radiation Oncology

## 2022-03-31 ENCOUNTER — Encounter (HOSPITAL_COMMUNITY): Payer: Self-pay | Admitting: Pulmonary Disease

## 2022-03-31 NOTE — Anesthesia Postprocedure Evaluation (Signed)
Anesthesia Post Note ? ?Patient: Lindsey Williamson ? ?Procedure(s) Performed: ROBOTIC ASSISTED NAVIGATIONAL BRONCHOSCOPY (Right) ?VIDEO BRONCHOSCOPY WITH ENDOBRONCHIAL ULTRASOUND (Bilateral) ?VIDEO BRONCHOSCOPY WITH RADIAL ENDOBRONCHIAL ULTRASOUND ?BRONCHIAL BIOPSIES ?BRONCHIAL BRUSHINGS ?BRONCHIAL NEEDLE ASPIRATION BIOPSIES ?FIDUCIAL MARKER PLACEMENT ?FOREIGN BODY REMOVAL ? ?  ? ?Patient location during evaluation: PACU ?Anesthesia Type: General ?Level of consciousness: awake and alert ?Pain management: pain level controlled ?Vital Signs Assessment: post-procedure vital signs reviewed and stable ?Respiratory status: spontaneous breathing, nonlabored ventilation, respiratory function stable and patient connected to nasal cannula oxygen ?Cardiovascular status: blood pressure returned to baseline and stable ?Postop Assessment: no apparent nausea or vomiting ?Anesthetic complications: no ? ? ?No notable events documented. ? ?Last Vitals:  ?Vitals:  ? 03/30/22 1209  ?BP: (!) 195/87  ?Pulse: 92  ?Resp: (!) 22  ?Temp: (!) 36.3 ?C  ?SpO2: 96%  ?  ?Last Pain:  ?Vitals:  ? 03/30/22 1209  ?PainSc: 0-No pain  ? ? ?  ?  ?  ?  ?  ?  ? ?[name] ? ? ? ? ?

## 2022-03-31 NOTE — Telephone Encounter (Signed)
Called patient on mobile/home line to schedule a consultation w. Dr. Tammi Klippel. No answer, LVM for a return call.  ?

## 2022-04-02 LAB — CYTOLOGY - NON PAP

## 2022-04-02 NOTE — Progress Notes (Signed)
Thoracic Location of Tumor / Histology:  Right lung cancer ? ?03/30/2022 Bronchial needle aspiration biopsy  ? ?Dr. Valeta Harms ?01/2022  Chest Imaging: ?She has 2 nodules of concern a right lower lobe 23 mm nodule and a left upper lobe 8 mm nodule. ? ?03/19/2022 super D CT and nuclear medicine pet imaging:  Hypermetabolic uptake in the right lower lobe nodule concerning for primary malignancy.  Also small 4 mm hypermetabolic focus within the medial portion of the right lower lobe concern for either inflammatory infiltrate versus nodule. ? ? ?Tobacco/Marijuana/Snuff/ETOH use: Former tobacco use (quit 03/26/2022) no drugs or alcohol use. ? ?Past/Anticipated interventions by cardiothoracic surgery, if any:  ? ?Dr. Valeta Harms ?03/30/2022 ?ROBOTIC ASSISTED NAVIGATIONAL BRONCHOSCOPY (Right) - ION w/ [facility] ?VIDEO BRONCHOSCOPY WITH ENDOBRONCHIAL ULTRASOUND (Bilateral) as a surgical intervention. ? ?Past/Anticipated interventions by medical oncology, if any: NA ? ?Signs/Symptoms ?Weight changes, if any:  No ?Respiratory complaints, if any: No ?Hemoptysis, if any: No, just after the biopsy. ?Pain issues, if any:  0/10 ? ?SAFETY ISSUES: ?Prior radiation?  No ?Pacemaker/ICD?  No ?Possible current pregnancy? No ?Is the patient on methotrexate? No ? ?Current Complaints / other details:   Need more information on treatment options. ?

## 2022-04-06 ENCOUNTER — Ambulatory Visit (INDEPENDENT_AMBULATORY_CARE_PROVIDER_SITE_OTHER): Payer: Medicare Other | Admitting: Acute Care

## 2022-04-06 ENCOUNTER — Encounter: Payer: Self-pay | Admitting: Acute Care

## 2022-04-06 VITALS — BP 132/74 | HR 75 | Temp 98.0°F | Ht 64.0 in | Wt 163.4 lb

## 2022-04-06 DIAGNOSIS — C3490 Malignant neoplasm of unspecified part of unspecified bronchus or lung: Secondary | ICD-10-CM | POA: Diagnosis not present

## 2022-04-06 DIAGNOSIS — Z720 Tobacco use: Secondary | ICD-10-CM | POA: Diagnosis not present

## 2022-04-06 DIAGNOSIS — R911 Solitary pulmonary nodule: Secondary | ICD-10-CM | POA: Diagnosis not present

## 2022-04-06 NOTE — Progress Notes (Signed)
? ?History of Present Illness ?Lindsey Williamson is a 71 y.o. female with  2 pulmonary nodules found on  lung cancer screening CT Chest with , longstanding history of tobacco abuse.She was referred to Dr. Valeta Harms for biopsy of pulmonary nodules.  ? ? ?04/06/2022 ?Pt. Presents for follow up after robotic assisted bronch with biopsies. She has done well after the procedure.She  did have some scant bleeding after the procedure, but no bleeding now. She has had a very sore throat, but this is getting better.  She has an appointment with Dr. Tammi Klippel to discuss  radiation therapy on 04/08/2022. Marland Kitchen She understands that she  has cancer, but understands the treatment option is radiation and she is comfortable with this. She understands she will learn more about the radiation treatments when she sees Dr. Johny Shears nurse and Dr. Tammi Klippel on Thursday. She has no respiratory issues. She walks every day for at least an hour. She has had no fever since her procedure. She is working on quitting smoking.  ? ?Test Results: ?03/30/2022 ?A. LUNG, RLL, FINE NEEDLE ASPIRATION:  ?- Malignant cells consistent with non-small cell carcinoma, see comment  ? ?B. LUNG, RLL, BRUSH:  ?- Atypical cells present ? ?03/17/2022 Super D CT Chest ? ?Irregular RIGHT lower lobe nodule just above the RIGHT ?hemidiaphragm measures 2.1 x 1.5 cm greatest axial dimension and ?approximately 9 mm greatest craniocaudal dimension, abutting the ?pleural surface in the RIGHT lower lobe and displaying irregular ?margins. Findings concerning for bronchogenic neoplasm. ?2. Subtle nodular density in the LEFT lung base 9 x 8 mm. This ?appears more bandlike in ill-defined in the coronal reconstruction ?and may represent an area of scarring associated with interstitial ?lung disease. Attention on follow-up. ?3. Subpleural reticulation, mild traction bronchiectasis with ?basilar predominance. Signs of interstitial lung disease with ?"probable UIP pattern". Consider high-resolution  chest CT on ?follow-up as warranted. ?4. Pulmonary emphysema and aortic atherosclerosis. ? ? ?03/17/2022 PET Scan  ?Hypermetabolic 1.7 cm solid basilar right lower lobe pulmonary ?nodule suspicious for primary bronchogenic malignancy. ?Small focus of hypermetabolism in the far medial superior segment ?right lower lobe associated with an indistinct 0.4 cm pulmonary ?nodule, nonspecific, potentially metastatic or inflammatory. Suggest ?attention on short-term follow-up chest CT in 3 months. ?No hypermetabolic thoracic adenopathy or distant metastatic ?disease. ?Chronic findings include: Aortic Atherosclerosis (ICD10-I70.0) ?and Emphysema (ICD10-J43.9). Coronary atherosclerosis. Spectrum of ?findings in the periphery of the lungs that may indicate fibrotic ?interstitial lung disease. High-resolution chest CT may be obtained ?for further evaluation as clinically warranted. Cholelithiasis. ?Myomatous uterus. Marked sigmoid diverticulosis. ?  ? ?02/23/2022 Lung Cancer Screening [facility] ? Comment                                                ?     Segment/Lobe                Right Lower Lobe          ?     Location                    Slice 956 (HTF)            ?     Status                      Baseline                  ?  Type                        Solid                      ?     Spiculated                  Yes                        ?     Long Axis                   23.1 mm                    ?     Short Axis                  17.0 mm                    ?     Average Diameter            20 mm                      ?     Equivalent Diameter         14.9 mm                    ?     Volume                      1731.3 mm3                ?     Mass                        1726.3 mg                  ?     Volume Change               -                          ?     Volume Doubling Time        -                          ?     Mass Doubling Time          -                          ?     Lung-RADS Category          4B                         ?----------------------------------------------------------  ?1    Comment                                                ?     Segment/Lobe  Left Upper Lobe            ?     Location                    Slice 237 (HTF)            ?     Status                      Baseline                  ?     Type                        Solid                      ?     Spiculated                  No                        ?     Long Axis                   8.0 mm                    ?     Short Axis                  4.5 mm                    ?     Average Diameter            6 mm                      ?     Equivalent Diameter         5.5 mm                    ?     Volume                      88.8 mm3                  ?     Mass                        86.2 mg                    ?     Volume Change               -                          ?     Volume Doubling Time        -                          ?     Mass Doubling Time          -                          ?     Lung-RADS Category  3                          ? ? ?  Latest Ref Rng & Units 03/30/2022  ?  9:44 AM 03/14/2015  ? 12:11 PM  ?CBC  ?WBC 4.0 - 10.5 K/uL 6.7   5.5    ?Hemoglobin 12.0 - 15.0 g/dL 13.0   12.1    ?Hematocrit 36.0 - 46.0 % 44.0   38.7    ?Platelets 150 - 400 K/uL 295   261    ? ? ? ?  Latest Ref Rng & Units 03/14/2015  ? 12:11 PM  ?BMP  ?Glucose 70 - 99 mg/dL 170    ?BUN 6 - 23 mg/dL 16    ?Creatinine 0.50 - 1.10 mg/dL 0.73    ?Sodium 135 - 145 mmol/L 135    ?Potassium 3.5 - 5.1 mmol/L 3.8    ?Chloride 96 - 112 mmol/L 104    ?CO2 19 - 32 mmol/L 24    ?Calcium 8.4 - 10.5 mg/dL 8.8    ? ? ?BNP ?No results found for: BNP ? ?ProBNP ?No results found for: PROBNP ? ?PFT ?No results found for: FEV1PRE, FEV1POST, FVCPRE, FVCPOST, TLC, DLCOUNC, PREFEV1FVCRT, PSTFEV1FVCRT ? ?NM PET Image Initial (PI) Skull Base To Thigh (F-18 FDG) ? ?Result Date: 03/19/2022 ?CLINICAL DATA:  Initial treatment strategy for pulmonary nodule. EXAM: NUCLEAR  MEDICINE PET SKULL BASE TO THIGH TECHNIQUE: 8.7 mCi F-18 FDG was injected intravenously. Full-ring PET imaging was performed from the skull base to thigh after the radiotracer. CT data was obtained and used for attenuation correction and anatomic localization. Fasting blood glucose: 100 mg/dl COMPARISON:  03/17/2022 chest CT. FINDINGS: Mediastinal blood pool activity: SUV max 2.1 Liver activity: SUV max NA NECK: No hypermetabolic lymph nodes in the neck. Incidental CT findings: none CHEST: Hypermetabolic solid basilar right lower lobe 1.7 cm pulmonary nodule with max SUV 5.0 (series 8/image 43). Small focus of hypermetabolism associated with an indistinct 0.4 cm pulmonary nodule in the far medial superior segment right lower lobe with max SUV 5.7 (series 8/image 27). No additional hypermetabolic pulmonary findings. No enlarged or hypermetabolic axillary, mediastinal or hilar lymph nodes. Incidental CT findings: Moderate paraseptal and centrilobular emphysema with diffuse bronchial wall thickening. Mild to moderate patchy peripheral reticulation and ground-glass opacity in both lungs. Coronary atherosclerosis. Atherosclerotic nonaneurysmal thoracic aorta. ABDOMEN/PELVIS: No abnormal hypermetabolic activity within the liver, pancreas, adrenal glands, or spleen. No hypermetabolic lymph nodes in the abdomen or pelvis. Incidental CT findings: Cholelithiasis. Atherosclerotic nonaneurysmal abdominal aorta. Marked sigmoid diverticulosis. Calcified central uterine 1.5 cm degenerated fibroid. SKELETON: No focal hypermetabolic activity to suggest skeletal metastasis. Incidental CT findings: none IMPRESSION: 1. Hypermetabolic 1.7 cm solid basilar right lower lobe pulmonary nodule suspicious for primary bronchogenic malignancy. 2. Small focus of hypermetabolism in the far medial superior segment right lower lobe associated with an indistinct 0.4 cm pulmonary nodule, nonspecific, potentially metastatic or inflammatory. Suggest  attention on short-term follow-up chest CT in 3 months. 3. No hypermetabolic thoracic adenopathy or distant metastatic disease. 4. Chronic findings include: Aortic Atherosclerosis (ICD10-I70.0) and Emphy

## 2022-04-06 NOTE — Patient Instructions (Addendum)
It is good to see you today. ?You have follow up with Dr. Tammi Klippel on Thursday at 10:30.   ?Continue to work on quitting smoking.  ?This is important for your overall health.  ?Please let us know if you need anything. ?Please contact office for sooner follow up if symptoms do not improve or worsen or seek emergency care   ?

## 2022-04-08 ENCOUNTER — Ambulatory Visit
Admission: RE | Admit: 2022-04-08 | Discharge: 2022-04-08 | Disposition: A | Discharge status: Home / Self Care | Payer: Medicare Other | Source: Ambulatory Visit | Attending: Radiation Oncology | Admitting: Radiation Oncology

## 2022-04-08 ENCOUNTER — Other Ambulatory Visit: Payer: Self-pay

## 2022-04-08 ENCOUNTER — Other Ambulatory Visit: Payer: Self-pay | Admitting: *Deleted

## 2022-04-08 ENCOUNTER — Encounter: Payer: Self-pay | Admitting: *Deleted

## 2022-04-08 DIAGNOSIS — C3431 Malignant neoplasm of lower lobe, right bronchus or lung: Secondary | ICD-10-CM | POA: Insufficient documentation

## 2022-04-08 DIAGNOSIS — C3491 Malignant neoplasm of unspecified part of right bronchus or lung: Secondary | ICD-10-CM | POA: Insufficient documentation

## 2022-04-08 DIAGNOSIS — R911 Solitary pulmonary nodule: Secondary | ICD-10-CM

## 2022-04-08 NOTE — Progress Notes (Signed)
The proposed treatment discussed in the cancer conference is for discussion purpose only an dis not a binding recommendation.  The patient was not physically examined nor present for their treatment options.  Therefore, final treatment plans cannot be decided.  ?

## 2022-04-08 NOTE — Progress Notes (Signed)
Patient case discussed in cancer conference today.  See TB flow sheet for updates.  ?

## 2022-04-08 NOTE — Progress Notes (Signed)
?Radiation Oncology         [phone] ?________________________________ ? ?Initial outpatient Consultation ? ?Name: Lindsey Williamson MRN: [id number]  ?Date of Service: 04/08/2022 DOB: 12-Jul-1951 ? ?VE:HMCNOBS, Jannifer Rodney, MD  Garner Nash, DO  ? ?REFERRING PHYSICIAN: Garner Nash, DO ? 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 ? ?  ICD-10-CM   ?1. Non-small cell carcinoma of lung, stage 1, right (HCC)  C34.91   ?  ? ? ?HISTORY OF PRESENT ILLNESS: Lindsey Williamson is a 71 y.o. female seen at the request of Dr. Valeta Harms.  She was noted to have a 2.3 cm spiculated nodule in the right lower lobe lung and an 8 mm solid nodule in the left upper lobe lung noted on screening CT chest performed [facility]/23/[facility]  She was referred to Dr. Valeta Harms for consult on 03/03/2022 and a PET scan was performed on 03/17/2022 for further evaluation.  This confirmed hypermetabolism in the 1.7 cm solid right lower lobe nodule, just above the hemidiaphragm and a separate, small focus of hypermetabolism associated with an indistinct 4 mm nodule in the far medial superior segment of the right lower lobe but no correlate on CT.  The LUL nodule was not hypermetabolic and there were no enlarged or hypermetabolic axillary, mediastinal or hilar lymph nodes and no evidence of distant metastasis.  There was moderate paraseptal and centrilobular emphysema with diffuse bronchial wall thickening and mild to moderate patchy peripheral reticulation and groundglass opacity bilaterally.  She underwent robotic assisted bronchoscopy with biopsy of the right lower lobe lung lesion and placement of a fiducial marker on 03/30/2022.  Final surgical pathology confirmed NSCLC, adenocarcinoma.  ?She reviewed the pathology with her pulmonologist and is adamantly not interested in surgery so she has been kindly referred today to discuss the radiation treatment options for management of her  disease. ? ?PREVIOUS RADIATION THERAPY: No ? ?PAST MEDICAL HISTORY:  ?Past Medical History:  ?Diagnosis Date  ? Arthritis   ? Diabetes mellitus without complication ([place])   ? Hypertension   ?   ? ?PAST SURGICAL HISTORY: ?Past Surgical History:  ?Procedure Laterality Date  ? BRONCHIAL BIOPSY  03/30/2022  ? Procedure: BRONCHIAL BIOPSIES;  Surgeon: Garner Nash, DO;  Location: [facility];  Service: Pulmonary;;  ? BRONCHIAL BRUSHINGS  03/30/2022  ? Procedure: BRONCHIAL BRUSHINGS;  Surgeon: Garner Nash, DO;  Location: [facility];  Service: Pulmonary;;  ? BRONCHIAL NEEDLE ASPIRATION BIOPSY  03/30/2022  ? Procedure: BRONCHIAL NEEDLE ASPIRATION BIOPSIES;  Surgeon: Garner Nash, DO;  Location: [place] [facility];  Service: Pulmonary;;  ? EYE SURGERY    ? FIDUCIAL MARKER PLACEMENT  03/30/2022  ? Procedure: FIDUCIAL MARKER PLACEMENT;  Surgeon: Garner Nash, DO;  Location: [place] [facility];  Service: Pulmonary;;  ? FOREIGN BODY REMOVAL  03/30/2022  ? Procedure: FOREIGN BODY REMOVAL;  Surgeon: Garner Nash, DO;  Location: [facility];  Service: Pulmonary;;  ? VIDEO BRONCHOSCOPY WITH ENDOBRONCHIAL ULTRASOUND Bilateral 03/30/2022  ? Procedure: VIDEO BRONCHOSCOPY WITH ENDOBRONCHIAL ULTRASOUND;  Surgeon: Garner Nash, DO;  Location: [place];  Service: Pulmonary;  Laterality: Bilateral;  ? VIDEO BRONCHOSCOPY WITH RADIAL ENDOBRONCHIAL ULTRASOUND  03/30/2022  ? Procedure: VIDEO BRONCHOSCOPY WITH RADIAL ENDOBRONCHIAL ULTRASOUND;  Surgeon: Garner Nash, DO;  Location: [place] [facility];  Service: Pulmonary;;  ? ? ?FAMILY HISTORY:  ?Family History  ?Problem Relation Age of Onset  ? Allergic rhinitis Sister   ? Asthma Sister   ?  Angioedema Neg Hx   ? Atopy Neg Hx   ? Immunodeficiency Neg Hx   ? Urticaria Neg Hx   ? Eczema Neg Hx   ? ? ?SOCIAL HISTORY:  ?Social History  ? ?Socioeconomic History  ? Marital status: Married  ?  Spouse name: Not on file  ? Number of children: Not on file  ? Years of education: Not on file  ? Highest  education level: Not on file  ?Occupational History  ? Not on file  ?Tobacco Use  ? Smoking status: Former  ?  Packs/day: 0.45  ?  Types: Cigarettes  ?  Quit date: 03/26/2022  ?  Years since quitting: 0.0  ? Smokeless tobacco: Former  ?Vaping Use  ? Vaping Use: Never used  ?Substance and Sexual Activity  ? Alcohol use: No  ? Drug use: Never  ? Sexual activity: Not on file  ?Other Topics Concern  ? Not on file  ?Social History Narrative  ? Not on file  ? ?Social Determinants of Health  ? ?Financial Resource Strain: Not on file  ?Food Insecurity: Not on file  ?Transportation Needs: Not on file  ?Physical Activity: Not on file  ?Stress: Not on file  ?Social Connections: Not on file  ?Intimate Partner Violence: Not on file  ? ? ?ALLERGIES: Aspirin, Azithromycin, and Codeine ? ?MEDICATIONS:  ?Current Outpatient Medications  ?Medication Sig Dispense Refill  ? amLODipine (NORVASC) 5 MG tablet Take 5 mg by mouth daily.    ? aspirin EC 81 MG tablet Take 81 mg by mouth daily. Swallow whole.    ? azelastine (ASTELIN) 0.1 % nasal spray Place 2 sprays into both nostrils 2 (two) times daily as needed for rhinitis. 30 mL 5  ? diclofenac Sodium (VOLTAREN) 1 % GEL Apply 1 application. topically 2 (two) times daily.    ? JARDIANCE 10 MG TABS tablet Take 10 mg by mouth daily.    ? lisinopril (ZESTRIL) 40 MG tablet Take 40 mg by mouth daily.    ? metFORMIN (GLUCOPHAGE-XR) 500 MG 24 hr tablet Take 1,000 mg by mouth 2 (two) times daily.    ? pravastatin (PRAVACHOL) 40 MG tablet Take 40 mg by mouth daily.    ? guaiFENesin (MUCINEX) 600 MG 12 hr tablet 1-2 tablets twice daily as needed.  Please be sure to drink plenty of liquids with this medication. 180 tablet 1  ? metFORMIN (GLUMETZA) 500 MG (MOD) 24 hr tablet Metformin (Patient not taking: Reported on 04/08/2022)    ? quinapril (ACCUPRIL) 40 MG tablet Take 40 mg by mouth daily. (Patient not taking: Reported on 04/08/2022)    ? ?No current facility-administered medications for this  encounter.  ? ? ?REVIEW OF SYSTEMS:  On review of systems, the patient reports that she is doing well overall. She denies any chest pain, shortness of breath, cough, hemoptysis, fevers, chills, night sweats, or unintended weight changes. She denies any bowel or bladder disturbances, and denies abdominal pain, nausea or vomiting. She denies any new musculoskeletal or joint aches or pains. A complete review of systems is obtained and is otherwise negative. ? ?  ?PHYSICAL EXAM:  ?Wt Readings from Last 3 Encounters:  ?04/08/22 163 lb 3.2 oz (74 kg)  ?04/06/22 163 lb 6.4 oz (74.1 kg)  ?03/29/22 164 lb (74.4 kg)  ? ?Temp Readings from Last 3 Encounters:  ?04/08/22 (!) 97.5 ?F (36.4 ?C)  ?04/06/22 98 ?F (36.7 ?C) (Oral)  ?03/30/22 (!) 97.3 ?F (36.3 ?C)  ? ?BP Readings  from Last 3 Encounters:  ?04/08/22 (!) 165/83  ?04/06/22 132/74  ?03/30/22 (!) 195/87  ? ?Pulse Readings from Last 3 Encounters:  ?04/08/22 71  ?04/06/22 75  ?03/30/22 92  ? ?Pain Assessment ?Pain Score: 0-No pain/10 ? ?In general this is a well appearing African-American female in no acute distress.  She's alert and oriented x4 and appropriate throughout the examination. Cardiopulmonary assessment is negative for acute distress and she exhibits normal effort.  ? ?KPS = 100 ? ?100 - Normal; no complaints; no evidence of disease. ?90   - Able to carry on normal activity; minor signs or symptoms of disease. ?80   - Normal activity with effort; some signs or symptoms of disease. ?21   - Cares for self; unable to carry on normal activity or to do active work. ?60   - Requires occasional assistance, but is able to care for most of his personal needs. ?50   - Requires considerable assistance and frequent medical care. ?48   - Disabled; requires special care and assistance. ?30   - Severely disabled; hospital admission is indicated although death not imminent. ?20   - Very sick; hospital admission necessary; active supportive treatment necessary. ?10   - Moribund;  fatal processes progressing rapidly. ?0     - Dead ? ?Karnofsky DA, Abelmann WH, Craver LS and Burchenal [facility] [phone]) The use of the nitrogen mustards in the palliative treatment of carcinoma: with particular referenc

## 2022-04-13 ENCOUNTER — Other Ambulatory Visit: Payer: Self-pay

## 2022-04-13 ENCOUNTER — Ambulatory Visit
Admission: RE | Admit: 2022-04-13 | Discharge: 2022-04-13 | Disposition: A | Discharge status: Home / Self Care | Payer: Medicare Other | Source: Ambulatory Visit | Attending: Radiation Oncology | Admitting: Radiation Oncology

## 2022-04-13 DIAGNOSIS — Z510 Encounter for antineoplastic radiation therapy: Secondary | ICD-10-CM | POA: Diagnosis present

## 2022-04-13 DIAGNOSIS — C3431 Malignant neoplasm of lower lobe, right bronchus or lung: Secondary | ICD-10-CM | POA: Insufficient documentation

## 2022-04-13 NOTE — Progress Notes (Signed)
?  Radiation Oncology         [phone] ?________________________________ ? ?Name: Lindsey Williamson MRN: [id number]  ?Date: 04/13/2022  DOB: Sep 03, 1951 ? ?STEREOTACTIC BODY RADIOTHERAPY ?SIMULATION AND TREATMENT PLANNING NOTE ? ?  ICD-10-CM   ?1. Primary cancer of right lower lobe of lung (HCC)  C34.31   ?  ? ? 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 ? ?NARRATIVE:  The patient was brought to the [facility].  Identity was confirmed.  All relevant records and images related to the planned course of therapy were reviewed.  The patient freely provided informed written consent to proceed with treatment after reviewing the details related to the planned course of therapy. The consent form was witnessed and verified by the simulation staff.  Then, the patient was set-up in a stable reproducible  supine position for radiation therapy.  A BodyFix immobilization pillow was fabricated for reproducible positioning.  Then I personally applied the abdominal compression paddle to limit respiratory excursion.  4D respiratoy motion management CT images were obtained.  Surface markings were placed.  The CT images were loaded into the planning software.  Then, using Cine, MIP, and standard views, the internal target volume (ITV) and planning target volumes (PTV) were delinieated, and avoidance structures were contoured.  We did acquire a deep inspiration breath hold scan for possible DIBH gated radiation, but, the diaphragmatic motion was not significantly reduced, so, we planned on free breathing with abdominal compression.  We fused her previous PET for target validation.  Treatment planning then occurred.  The radiation prescription was entered and confirmed.  A total of two complex treatment devices were fabricated in the form of the BodyFix immobilization pillow and a neck accuform cushion.  I have requested : 3D Simulation  I have  requested a DVH of the following structures: Heart, Lungs, Esophagus, Chest Wall, Brachial Plexus, Major Blood Vessels, and targets. ? ?SPECIAL TREATMENT PROCEDURE:  The planned course of therapy using radiation constitutes a special treatment procedure. Special care is required in the management of this patient for the following reasons. This treatment constitutes a Special Treatment Procedure for the following reason: [ High dose per fraction requiring special monitoring for increased toxicities of treatment including daily imaging..  The special nature of the planned course of radiotherapy will require increased physician supervision and oversight to ensure patient's safety with optimal treatment outcomes.  This requires extended time and effort.   ? ?RESPIRATORY MOTION MANAGEMENT SIMULATION:  In order to account for effect of respiratory motion on target structures and other organs in the planning and delivery of radiotherapy, this patient underwent respiratory motion management simulation.  To accomplish this, when the patient was brought to the CT simulation planning suite, 4D respiratoy motion management CT images were obtained.  The CT images were loaded into the planning software.  Then, using a variety of tools including Cine, MIP, and standard views, the target volume and planning target volumes (PTV) were delineated.  Avoidance structures were contoured.  Treatment planning then occurred.  Dose volume histograms were generated and reviewed for each of the requested structure.  The resulting plan was carefully reviewed and approved today. ? ?PLAN:  The patient will receive 54 Gy in 3 fractions. ? ?________________________________ ? ?Lindsey Williamson, M.D. ? ?

## 2022-04-15 DIAGNOSIS — Z510 Encounter for antineoplastic radiation therapy: Secondary | ICD-10-CM | POA: Diagnosis not present

## 2022-04-23 ENCOUNTER — Other Ambulatory Visit: Payer: Self-pay

## 2022-04-23 ENCOUNTER — Ambulatory Visit
Admission: RE | Admit: 2022-04-23 | Discharge: 2022-04-23 | Disposition: A | Discharge status: Home / Self Care | Payer: Medicare Other | Source: Ambulatory Visit | Attending: Radiation Oncology | Admitting: Radiation Oncology

## 2022-04-23 DIAGNOSIS — C3431 Malignant neoplasm of lower lobe, right bronchus or lung: Secondary | ICD-10-CM

## 2022-04-23 DIAGNOSIS — Z510 Encounter for antineoplastic radiation therapy: Secondary | ICD-10-CM | POA: Diagnosis not present

## 2022-04-23 LAB — RAD ONC ARIA SESSION SUMMARY
Course Elapsed Days: 0
Plan Fractions Treated to Date: 1
Plan Prescribed Dose Per Fraction: 18 Gy
Plan Total Fractions Prescribed: 3
Plan Total Prescribed Dose: 54 Gy
Reference Point Dosage Given to Date: 18 Gy
Reference Point Session Dosage Given: 18 Gy
Session Number: 1

## 2022-04-27 ENCOUNTER — Other Ambulatory Visit: Payer: Self-pay

## 2022-04-27 ENCOUNTER — Ambulatory Visit
Admission: RE | Admit: 2022-04-27 | Discharge: 2022-04-27 | Disposition: A | Discharge status: Home / Self Care | Payer: Medicare Other | Source: Ambulatory Visit | Attending: Radiation Oncology | Admitting: Radiation Oncology

## 2022-04-27 DIAGNOSIS — C3431 Malignant neoplasm of lower lobe, right bronchus or lung: Secondary | ICD-10-CM | POA: Diagnosis present

## 2022-04-27 LAB — RAD ONC ARIA SESSION SUMMARY
Course Elapsed Days: 4
Plan Fractions Treated to Date: 2
Plan Prescribed Dose Per Fraction: 18 Gy
Plan Total Fractions Prescribed: 3
Plan Total Prescribed Dose: 54 Gy
Reference Point Dosage Given to Date: 36 Gy
Reference Point Session Dosage Given: 18 Gy
Session Number: 2

## 2022-04-29 ENCOUNTER — Encounter: Payer: Self-pay | Admitting: Urology

## 2022-04-29 ENCOUNTER — Ambulatory Visit
Admission: RE | Admit: 2022-04-29 | Discharge: 2022-04-29 | Disposition: A | Discharge status: Home / Self Care | Payer: Medicare Other | Source: Ambulatory Visit | Attending: Radiation Oncology | Admitting: Radiation Oncology

## 2022-04-29 ENCOUNTER — Other Ambulatory Visit: Payer: Self-pay

## 2022-04-29 DIAGNOSIS — C3431 Malignant neoplasm of lower lobe, right bronchus or lung: Secondary | ICD-10-CM | POA: Diagnosis not present

## 2022-04-29 LAB — RAD ONC ARIA SESSION SUMMARY
Course Elapsed Days: 6
Plan Fractions Treated to Date: 3
Plan Prescribed Dose Per Fraction: 18 Gy
Plan Total Fractions Prescribed: 3
Plan Total Prescribed Dose: 54 Gy
Reference Point Dosage Given to Date: 54 Gy
Reference Point Session Dosage Given: 18 Gy
Session Number: 3

## 2022-05-21 ENCOUNTER — Ambulatory Visit (INDEPENDENT_AMBULATORY_CARE_PROVIDER_SITE_OTHER): Payer: Medicare Other | Admitting: Internal Medicine

## 2022-05-21 ENCOUNTER — Encounter: Payer: Self-pay | Admitting: Internal Medicine

## 2022-05-21 VITALS — BP 130/70 | HR 77 | Temp 97.6°F | Resp 18 | Ht 64.0 in | Wt 162.4 lb

## 2022-05-21 DIAGNOSIS — H1013 Acute atopic conjunctivitis, bilateral: Secondary | ICD-10-CM

## 2022-05-21 DIAGNOSIS — Z85118 Personal history of other malignant neoplasm of bronchus and lung: Secondary | ICD-10-CM

## 2022-05-21 DIAGNOSIS — I10 Essential (primary) hypertension: Secondary | ICD-10-CM | POA: Diagnosis not present

## 2022-05-21 DIAGNOSIS — J3089 Other allergic rhinitis: Secondary | ICD-10-CM

## 2022-05-21 DIAGNOSIS — R053 Chronic cough: Secondary | ICD-10-CM | POA: Diagnosis not present

## 2022-05-21 DIAGNOSIS — H1010 Acute atopic conjunctivitis, unspecified eye: Secondary | ICD-10-CM

## 2022-05-21 MED ORDER — FAMOTIDINE 20 MG PO TABS
20.0000 mg | ORAL_TABLET | Freq: Two times a day (BID) | ORAL | 1 refills | Status: DC
Start: 2022-05-21 — End: 2023-02-22

## 2022-05-21 MED ORDER — FLUTICASONE PROPIONATE 50 MCG/ACT NA SUSP: 2.0000 | Freq: Every day | NASAL | 2 refills | Status: DC

## 2022-05-21 MED ORDER — MONTELUKAST SODIUM 10 MG PO TABS: 10.0000 mg | ORAL_TABLET | Freq: Every day | ORAL | 1 refills | Status: DC

## 2022-05-21 NOTE — Progress Notes (Signed)
Follow Up Note  RE: Lindsey Williamson MRN: [id number] DOB: 1951/06/15 Date of Office Visit: 05/21/2022  Referring provider: Katherina Mires, MD Primary care provider: Katherina Mires, MD  Chief Complaint: Allergic Rhinits (LOV: 04/28/20  Sneezing, facial Leandro Reasoner pressure, cough, runny nose since March//Patient states she was Dx Stage 1 non small cell lung cancer adenocarcinoma  - last Tx 04/29/22) and Allergic Rhinoconjunctivitis (LOV: 04/28/20  Itchy eyes and nose since March)  History of Present Illness: I had the pleasure of seeing Lindsey Williamson for a follow up visit at the [facility] on 05/21/2022. She is a 71 y.o. female, who is being followed for mixed rhinoconjunctivitis and chronic cough. Her previous allergy office visit was on 04/28/2020 with Dr. Verlin Fester. Today is a regular follow up visit.  Since last visit she was diagnosed with stage 1 NSCLC s/p 3 radiation treatments.  Denies any changes in respiratory status since treatment.   Mixed rhinitis: current therapy: Azelastine, Flonase, sinus rinse,  symptoms  worsen since March 2023  symptoms include:  Chronic cough, nasal congestion, rhinorrhea, post nasal drainage, sneezing, watery eyes, itchy eyes, and itchy nose, sinus congestion  Previous allergy testing:  Epicutaneous testing: Negative despite a positive histamine control. Previous Testing: 2021 borderline intradermal positive to ragweed mix, cat hair, dust mite History of reflux/heartburn: yes: on medication, but cannot recall, still with break through symptoms  Interested in Allergy Immunotherapy: no  Chronic cough: Worsened after Biopsy and since she stopped smoking, more recently calmed down.    Assessment and Plan: Lindsey Williamson is a 71 y.o. female with: Other allergic rhinitis  Seasonal allergic conjunctivitis  Chronic cough  Personal history of lung cancer  Primary hypertension Plan: Patient Instructions  MIxed Rhinitis: not well  controlled   -Continue avoidance measures of weed, cat, dust - Continue with: Astelin (azelastine) 2 sprays per nostril 1-2 times daily as needed - Start taking: Singulair (montelukast) 10mg  daily and Flonase (fluticasone) two sprays per nostril daily - You can use an extra dose of the antihistamine, if needed, for breakthrough symptoms.  - Consider nasal saline rinses 1-2 times daily to remove allergens from the nasal cavities as well as help with mucous clearance (this is especially helpful to do before the nasal sprays are given) - Consider allergy shots as a means of long-term control and can reduce lifetime use of medications  - Allergy shots "re-train" and "reset" the immune system to ignore environmental allergens and decrease the resulting immune response to those allergens (sneezing, itchy watery eyes, runny nose, nasal congestion, etc).    - Allergy shots improve symptoms in 75-85%  - Allergy shots are the only potential permanent and disease modifying option  - We can discuss more at the next appointment if the medications are not working for you.   Cough  -We will focus on contributing causes to include reflux and rhinitis  -Continue to walk for pulmonary rehab -It is true that your cough will increase after you quit smoking, but this should slowly come down with time -Congratulations for quitting smoking that is an incredible achievement!   GERD  -Continue dietary and lifestyle modifications -Restart famotidine 20 mg twice daily   Hypertension  -Blood pressure slightly elevated today in clinic follow-up with primary care   Follow up: 2 months   Thank you so much for letting me partake in your care today.  Don't hesitate to reach out if you have any additional concerns!  Roney Marion,  MD  [facility] [place] [place]  Return in about 2 months (around 07/21/2022).  Meds ordered this encounter  Medications   fluticasone (FLONASE) 50 MCG/ACT nasal spray    Sig:  Place 2 sprays into both nostrils daily.    Dispense:  16 g    Refill:  2   montelukast (SINGULAIR) 10 MG tablet    Sig: Take 1 tablet (10 mg total) by mouth at bedtime.    Dispense:  90 tablet    Refill:  1   famotidine (PEPCID) 20 MG tablet    Sig: Take 1 tablet (20 mg total) by mouth 2 (two) times daily.    Dispense:  180 tablet    Refill:  1    Lab Orders  No laboratory test(s) ordered today   Diagnostics: None performed    Medication List:  Current Outpatient Medications  Medication Sig Dispense Refill   amLODipine (NORVASC) 5 MG tablet Take 5 mg by mouth daily.     aspirin EC 81 MG tablet Take 81 mg by mouth daily. Swallow whole.     azelastine (ASTELIN) 0.1 % nasal spray Place 2 sprays into both nostrils 2 (two) times daily as needed for rhinitis. 30 mL 5   diclofenac Sodium (VOLTAREN) 1 % GEL Apply 1 application. topically 2 (two) times daily.     famotidine (PEPCID) 20 MG tablet Take 1 tablet (20 mg total) by mouth 2 (two) times daily. 180 tablet 1   fluticasone (FLONASE) 50 MCG/ACT nasal spray Place 2 sprays into both nostrils daily. 16 g 2   JARDIANCE 10 MG TABS tablet Take 10 mg by mouth daily.     lisinopril (ZESTRIL) 40 MG tablet Take 40 mg by mouth daily.     metFORMIN (GLUCOPHAGE-XR) 500 MG 24 hr tablet Take 1,000 mg by mouth 2 (two) times daily.     montelukast (SINGULAIR) 10 MG tablet Take 1 tablet (10 mg total) by mouth at bedtime. 90 tablet 1   pravastatin (PRAVACHOL) 40 MG tablet Take 40 mg by mouth daily.     No current facility-administered medications for this visit.   Allergies: Allergies  Allergen Reactions   Aspirin Palpitations   Azithromycin Palpitations   Codeine Palpitations   I reviewed her past medical history, social history, family history, and environmental history and no significant changes have been reported from her previous visit.  ROS: All others negative except as noted per HPI.   Objective: BP 130/70   Pulse 77   Temp 97.6  F (36.4 C)   Resp 18   Ht 5\' 4"  (1.626 m)   Wt 162 lb 6.4 oz (73.7 kg)   SpO2 98%   BMI 27.88 kg/m  Body mass index is 27.88 kg/m. General Appearance:  Alert, cooperative, no distress, appears stated age  Head:  Normocephalic, without obvious abnormality, atraumatic  Eyes:  Conjunctiva clear, EOM's intact  Nose: Nares normal,  erythematous nasal mucosa, hypertrophic turbinates, no visible anterior polyps, and septum midline  Throat: Lips, tongue normal; teeth and gums normal, normal posterior oropharynx and no tonsillar exudate  Neck: Supple, symmetrical  Lungs:   clear to auscultation bilaterally, Respirations unlabored, no coughing  Heart:  regular rate and rhythm and no murmur, Appears well perfused  Extremities: No edema  Skin: Skin color, texture, turgor normal, no rashes or lesions on visualized portions of skin  Neurologic: No gross deficits   Previous notes and tests were reviewed. The plan was reviewed with the  patient/family, and all questions/concerned were addressed.  It was my pleasure to see Harmoney today and participate in her care. Please feel free to contact me with any questions or concerns.  Sincerely,  Roney Marion, MD  Allergy & Immunology  [facility] Office: [phone]

## 2022-05-21 NOTE — Patient Instructions (Addendum)
MIxed Rhinitis: not well  controlled  -Continue avoidance measures of weed, cat, dust - Continue with: Astelin (azelastine) 2 sprays per nostril 1-2 times daily as needed - Start taking: Singulair (montelukast) 10mg  daily and Flonase (fluticasone) two sprays per nostril daily - You can use an extra dose of the antihistamine, if needed, for breakthrough symptoms.  - Consider nasal saline rinses 1-2 times daily to remove allergens from the nasal cavities as well as help with mucous clearance (this is especially helpful to do before the nasal sprays are given) - Consider allergy shots as a means of long-term control and can reduce lifetime use of medications  - Allergy shots "re-train" and "reset" the immune system to ignore environmental allergens and decrease the resulting immune response to those allergens (sneezing, itchy watery eyes, runny nose, nasal congestion, etc).    - Allergy shots improve symptoms in 75-85%  - Allergy shots are the only potential permanent and disease modifying option  - We can discuss more at the next appointment if the medications are not working for you.   Cough  -We will focus on contributing causes to include reflux and rhinitis  -Continue to walk for pulmonary rehab -It is true that your cough will increase after you quit smoking, but this should slowly come down with time -Congratulations for quitting smoking that is an incredible achievement!   GERD  -Continue dietary and lifestyle modifications -Restart famotidine 20 mg twice daily   Hypertension  -Blood pressure slightly elevated today in clinic follow-up with primary care   Follow up: 2 months   Thank you so much for letting me partake in your care today.  Don't hesitate to reach out if you have any additional concerns!  Roney Marion, MD  [facility] [place] [place]

## 2022-06-02 ENCOUNTER — Encounter: Payer: Self-pay | Admitting: Urology

## 2022-06-02 NOTE — Progress Notes (Signed)
Telephone appointment. I verified patient's identity and began nursing interview. Patient experiencing high stress/anxiety 8/10 in relation to her health concerns. No other issues reported at this time.  Meaningful use complete.  Reminded patient of her 10:30am-06/03/22 telephone appointment w/ Ashlyn Bruning PA-C. I left my extension [phone] in case patient needs anything. Patient verbalized understanding.  Patient contact [phone]

## 2022-06-02 NOTE — Progress Notes (Signed)
  Radiation Oncology         [phone] ________________________________  Name: TAVON CORRIHER MRN: [id number]  Date: 04/29/2022  DOB: 1951/05/31  End of Treatment Note  Diagnosis:   71 year old female with newly diagnosed stage IA, NSCLC, adenocarcinoma of the right lower lobe lung.     Indication for treatment:  Curative, Definitive SBRT       Radiation treatment dates:   04/23/22 - 04/29/22  Site/dose:   The target was treated to 54 Gy in 3 fractions of 18 Gy  Beams/energy:   The patient was treated using stereotactic body radiotherapy according to a 3D conformal radiotherapy plan.  Volumetric arc fields were employed to deliver 6 MV X-rays.  Image guidance was performed with per fraction cone beam CT prior to treatment under personal MD supervision.  Immobilization was achieved using BodyFix Pillow.  Narrative: The patient tolerated radiation treatment relatively well.     Plan: The patient has completed radiation treatment. The patient will return to radiation oncology clinic for routine followup in one month. I advised them to call or return sooner if they have any questions or concerns related to their recovery or treatment. ________________________________  Sheral Apley. Tammi Klippel, M.D.

## 2022-06-03 ENCOUNTER — Ambulatory Visit
Admission: RE | Admit: 2022-06-03 | Discharge: 2022-06-03 | Disposition: A | Discharge status: Home / Self Care | Payer: Medicare Other | Source: Ambulatory Visit | Attending: Urology | Admitting: Urology

## 2022-06-03 DIAGNOSIS — C3431 Malignant neoplasm of lower lobe, right bronchus or lung: Secondary | ICD-10-CM

## 2022-06-03 NOTE — Progress Notes (Signed)
Radiation Oncology         [phone] ________________________________  Name: Lindsey Williamson MRN: [id number]  Date: 06/03/2022  DOB: April 03, 1951  Post Treatment Note  CC: Katherina Mires, MD  Garner Nash, DO  Diagnosis:   71 year old female with newly diagnosed stage IA, NSCLC, adenocarcinoma of the right lower lobe lung.  Interval Since Last Radiation:  4.5 weeks  04/23/22 - 04/29/22:  The target was treated to 54 Gy in 3 fractions of 18 Gy  Narrative: I spoke with the patient to conduct her routine scheduled 1 month follow up visit via telephone to spare the patient unnecessary potential exposure in the healthcare setting during the current COVID-19 pandemic.  The patient was notified in advance and gave permission to proceed with this visit format.  She tolerated radiation treatment relatively well.                              On review of systems, the patient states that she is doing well in general.  She has been able to quit smoking, with the exception of 1 to 2 cigarettes over the last month which is a major achievement for her. She continues with some mild fatigue but is remaining active, walking 1 to 2 miles per day for exercise.  She specifically denies any increased shortness of breath, chest pain, hemoptysis, productive cough, fevers, chills or night sweats.  She does not have much of an appetite but is eating to maintain her weight and overall, is pleased with her progress to date.  ALLERGIES:  is allergic to aspirin, azithromycin, and codeine.  Meds: Current Outpatient Medications  Medication Sig Dispense Refill   amLODipine (NORVASC) 5 MG tablet Take 5 mg by mouth daily.     aspirin EC 81 MG tablet Take 81 mg by mouth daily. Swallow whole.     azelastine (ASTELIN) 0.1 % nasal spray Place 2 sprays into both nostrils 2 (two) times daily as needed for rhinitis. 30 mL 5   diclofenac Sodium (VOLTAREN) 1 % GEL Apply 1 application. topically 2 (two) times daily.      famotidine (PEPCID) 20 MG tablet Take 1 tablet (20 mg total) by mouth 2 (two) times daily. 180 tablet 1   fluticasone (FLONASE) 50 MCG/ACT nasal spray Place 2 sprays into both nostrils daily. 16 g 2   JARDIANCE 10 MG TABS tablet Take 10 mg by mouth daily.     lisinopril (ZESTRIL) 40 MG tablet Take 40 mg by mouth daily.     metFORMIN (GLUCOPHAGE-XR) 500 MG 24 hr tablet Take 1,000 mg by mouth 2 (two) times daily.     montelukast (SINGULAIR) 10 MG tablet Take 1 tablet (10 mg total) by mouth at bedtime. 90 tablet 1   pravastatin (PRAVACHOL) 40 MG tablet Take 40 mg by mouth daily.     No current facility-administered medications for this encounter.    Physical Findings:  vitals were not taken for this visit.  Pain Assessment Pain Score: 0-No pain/10 Unable to assess due to telephone follow-up visit format.  Lab Findings: Lab Results  Component Value Date   WBC 6.7 03/30/2022   HGB 13.0 03/30/2022   HCT 44.0 03/30/2022   MCV 89.8 03/30/2022   PLT 295 03/30/2022     Radiographic Findings: No results found.  Impression/Plan: 80. 71 year old female with newly diagnosed stage IA, NSCLC, adenocarcinoma of the right lower lobe lung. She appears to  have recovered well from the effects of her recent stereotactic body radiotherapy to the right lower lobe lung lesion and is currently without complaints.  We discussed that we will obtain a posttreatment CT chest in the next 1 to 2 weeks and I will contact her by telephone to review the results and any recommendations at that time.  Pending this scan is stable, we will then plan to move forward with serial CT chest scans every 3 to 6 months to monitor for any signs of disease recurrence or progression.  I will connect with her following each scan to review those results and treatment recommendations.  She appears to have a good understanding of these recommendations and is comfortable and in agreement with the stated plan.  She knows that she is  welcome to call at anytime in the interim with any questions or concerns.    Nicholos Johns, PA-C

## 2022-06-11 ENCOUNTER — Ambulatory Visit (HOSPITAL_COMMUNITY)
Admission: RE | Admit: 2022-06-11 | Discharge: 2022-06-11 | Disposition: A | Discharge status: Home / Self Care | Payer: Medicare Other | Source: Ambulatory Visit | Attending: Urology | Admitting: Urology

## 2022-06-11 DIAGNOSIS — C3431 Malignant neoplasm of lower lobe, right bronchus or lung: Secondary | ICD-10-CM | POA: Insufficient documentation

## 2022-06-11 MED ORDER — IOHEXOL 300 MG/ML  SOLN
100.0000 mL | Freq: Once | INTRAMUSCULAR | Status: AC | PRN
  Administered 2022-06-11: 75 mL via INTRAVENOUS

## 2022-06-15 ENCOUNTER — Encounter: Payer: Self-pay | Admitting: Urology

## 2022-06-15 ENCOUNTER — Ambulatory Visit
Admission: RE | Admit: 2022-06-15 | Discharge: 2022-06-15 | Disposition: A | Discharge status: Home / Self Care | Payer: Medicare Other | Source: Ambulatory Visit | Attending: Radiation Oncology | Admitting: Radiation Oncology

## 2022-06-15 DIAGNOSIS — C3431 Malignant neoplasm of lower lobe, right bronchus or lung: Secondary | ICD-10-CM | POA: Insufficient documentation

## 2022-06-15 NOTE — Progress Notes (Signed)
Radiation Oncology         [phone] ________________________________  Name: Lindsey Williamson MRN: [id number]  Date: 06/15/2022  DOB: 23-Nov-1951  Post Treatment Note  CC: Katherina Mires, MD  Garner Nash, DO  Diagnosis:   71 year old female with newly diagnosed stage IA, NSCLC, adenocarcinoma of the right lower lobe lung.  Interval Since Last Radiation:  6.5 weeks  04/23/22 - 04/29/22:  The RLL lung target was treated to 54 Gy in 3 fractions of 18 Gy  Narrative: I spoke with the patient to conduct her routine scheduled follow up visit to review the results of her recent CT Chest scan via telephone to spare the patient unnecessary potential exposure in the healthcare setting during the current COVID-19 pandemic.  The patient was notified in advance and gave permission to proceed with this visit format.  She tolerated radiation treatment relatively well and remains without complaints.  Her recent CT Chest scan from 06/11/22 shows an excellent response to treatment with interval decrease in size of the previously treated noncalcified nodule in the right lower lobe which measures 1.6 cm in maximum diameter on the current study. There are no new nodules or evidence of disease progression or recurrence.   We reviewed these results today.                     On review of systems, the patient states that she is doing well in general.  She has been able to quit smoking, with the exception of 1 to 2 cigarettes over the last month which is a major achievement for her. She continues with some mild fatigue but is remaining active, walking 1 to 2 miles per day for exercise.  She specifically denies any increased shortness of breath, chest pain, hemoptysis, productive cough, fevers, chills or night sweats.  She does not have much of an appetite but is eating to maintain her weight and overall, is pleased with her progress to date.  ALLERGIES:  is allergic to aspirin, azithromycin, and  codeine.  Meds: Current Outpatient Medications  Medication Sig Dispense Refill   amLODipine (NORVASC) 5 MG tablet Take 5 mg by mouth daily.     aspirin EC 81 MG tablet Take 81 mg by mouth daily. Swallow whole.     azelastine (ASTELIN) 0.1 % nasal spray Place 2 sprays into both nostrils 2 (two) times daily as needed for rhinitis. 30 mL 5   diclofenac Sodium (VOLTAREN) 1 % GEL Apply 1 application. topically 2 (two) times daily.     famotidine (PEPCID) 20 MG tablet Take 1 tablet (20 mg total) by mouth 2 (two) times daily. 180 tablet 1   fluticasone (FLONASE) 50 MCG/ACT nasal spray Place 2 sprays into both nostrils daily. 16 g 2   JARDIANCE 10 MG TABS tablet Take 10 mg by mouth daily.     lisinopril (ZESTRIL) 40 MG tablet Take 40 mg by mouth daily.     metFORMIN (GLUCOPHAGE-XR) 500 MG 24 hr tablet Take 1,000 mg by mouth 2 (two) times daily.     montelukast (SINGULAIR) 10 MG tablet Take 1 tablet (10 mg total) by mouth at bedtime. 90 tablet 1   pravastatin (PRAVACHOL) 40 MG tablet Take 40 mg by mouth daily.     No current facility-administered medications for this encounter.    Physical Findings:  vitals were not taken for this visit.  Pain Assessment Pain Score: 0-No pain/10 Unable to assess due to telephone follow-up visit  format.  Lab Findings: Lab Results  Component Value Date   WBC 6.7 03/30/2022   HGB 13.0 03/30/2022   HCT 44.0 03/30/2022   MCV 89.8 03/30/2022   PLT 295 03/30/2022     Radiographic Findings: CT CHEST W CONTRAST  Result Date: 06/14/2022 CLINICAL DATA:  Non-small-cell lung carcinoma, radiation treatment, assess treatment response EXAM: CT CHEST WITH CONTRAST TECHNIQUE: Multidetector CT imaging of the chest was performed during intravenous contrast administration. RADIATION DOSE REDUCTION: This exam was performed according to the departmental dose-optimization program which includes automated exposure control, adjustment of the mA and/or kV according to patient  size and/or use of iterative reconstruction technique. CONTRAST:  76mL OMNIPAQUE IOHEXOL 300 MG/ML  SOLN COMPARISON:  Previous studies including CT chest done on 03/17/2022 and chest radiographs done on 03/30/2022 FINDINGS: Cardiovascular: Coronary artery calcifications are seen. There is homogeneous enhancement in thoracic aorta. There are no intraluminal filling defects in the central pulmonary artery branches. There is ectasia of main pulmonary artery measuring 3.4 cm suggesting possible pulmonary arterial hypertension. Mediastinum/Nodes: There are slightly enlarged lymph nodes in the mediastinum and hilar regions with no significant interval change. Lungs/Pleura: Increased interstitial markings are seen in the periphery of both lungs. There is ectasia of bronchi. Findings suggest chronic interstitial lung disease with fibrosis. There are scattered subpleural blebs and bullae. There is 1.6 x 1.3 cm noncalcified pleural-based nodule in the right lower lobe with spiculated margins. The lesion measured 2.1 cm in maximum diameter in the previous study. In the image eighty-seven of series 5 there is 6 mm faint radiopacity in the posterior left lower lung fields which appears less prominent. This may be part of scarring in the periphery of both lungs. There are no new nodules or new infiltrates in the lung fields. Upper Abdomen: Unremarkable. Musculoskeletal: Decrease in height of upper endplate of body of L1 vertebra appears stable. IMPRESSION: There is interval decrease in size of noncalcified nodule in the right lower lobe which measures 1.6 cm in maximum diameter in the current study. There are no new nodules. No new significant lymphadenopathy seen. Chronic interstitial lung disease. Faint pleural-based density seen in the left lower lobe in the previous study appears smaller in size. Coronary artery disease. Electronically Signed   By: Elmer Picker M.D.   On: 06/14/2022 11:19     Impression/Plan: 39. 71 year old female with newly diagnosed stage IA, NSCLC, adenocarcinoma of the right lower lobe lung. She has recovered well from the effects of her recent stereotactic body radiotherapy to the right lower lobe lung lesion and is currently without complaints.  Her recent posttreatment CT chest scan from 06/11/22 shows an excellent response to treatment with interval decrease in size of the previously treated noncalcified nodule in the right lower lobe which measures 1.6 cm in maximum diameter on the current study. There are no new nodules or evidence of disease progression or recurrence. Therefore, we will then plan to move forward with serial CT chest scans every 3 to 6 months to monitor for any signs of disease recurrence or progression.  I will connect with her following each scan to review those results and treatment recommendations.  She appears to have a good understanding of these recommendations and is comfortable and in agreement with the stated plan.  She knows that she is welcome to call at anytime in the interim with any questions or concerns.   I personally spent 20 minutes in this encounter including chart review, reviewing radiological studies, telephone conversation with  the patient, entering orders and completing documentation.    Nicholos Johns, PA-C

## 2022-06-15 NOTE — Progress Notes (Addendum)
Telephone appointment. I spoke w/ patient's spouse Mr. Kaytee Taliercio, verified his identity and began nursing interview. Spouse reports that patient has an occasional, mild cough. No other issues reported at this time.  Meaningful use complete.  Reminded spouse of patient's 3:00pm-06/15/22 telephone appointment w/ Ashlyn Bruning PA-C. I left my extension [phone] in case patient needs anything. Spouse expressed verbal understanding.  Patient contact [phone]

## 2022-07-30 ENCOUNTER — Ambulatory Visit (INDEPENDENT_AMBULATORY_CARE_PROVIDER_SITE_OTHER): Payer: Medicare Other | Admitting: Internal Medicine

## 2022-07-30 ENCOUNTER — Encounter: Payer: Self-pay | Admitting: Internal Medicine

## 2022-07-30 VITALS — BP 128/62 | HR 74 | Temp 97.6°F | Ht 64.0 in | Wt 162.0 lb

## 2022-07-30 DIAGNOSIS — K640 First degree hemorrhoids: Secondary | ICD-10-CM | POA: Insufficient documentation

## 2022-07-30 DIAGNOSIS — K219 Gastro-esophageal reflux disease without esophagitis: Secondary | ICD-10-CM | POA: Diagnosis not present

## 2022-07-30 DIAGNOSIS — J0101 Acute recurrent maxillary sinusitis: Secondary | ICD-10-CM | POA: Diagnosis not present

## 2022-07-30 DIAGNOSIS — R058 Other specified cough: Secondary | ICD-10-CM | POA: Diagnosis not present

## 2022-07-30 DIAGNOSIS — E1165 Type 2 diabetes mellitus with hyperglycemia: Secondary | ICD-10-CM | POA: Insufficient documentation

## 2022-07-30 DIAGNOSIS — K625 Hemorrhage of anus and rectum: Secondary | ICD-10-CM | POA: Insufficient documentation

## 2022-07-30 DIAGNOSIS — K5730 Diverticulosis of large intestine without perforation or abscess without bleeding: Secondary | ICD-10-CM | POA: Insufficient documentation

## 2022-07-30 DIAGNOSIS — J3089 Other allergic rhinitis: Secondary | ICD-10-CM | POA: Diagnosis not present

## 2022-07-30 DIAGNOSIS — Z85118 Personal history of other malignant neoplasm of bronchus and lung: Secondary | ICD-10-CM

## 2022-07-30 MED ORDER — FLUTICASONE PROPIONATE 50 MCG/ACT NA SUSP: 2.0000 | Freq: Every day | NASAL | 3 refills | Status: DC

## 2022-07-30 MED ORDER — CETIRIZINE HCL 10 MG PO TABS: 10.0000 mg | ORAL_TABLET | Freq: Every day | ORAL | 1 refills | Status: DC

## 2022-07-30 MED ORDER — MONTELUKAST SODIUM 10 MG PO TABS
10.0000 mg | ORAL_TABLET | Freq: Every day | ORAL | 1 refills | Status: DC
Start: 2022-07-30 — End: 2023-02-22

## 2022-07-30 MED ORDER — AMOXICILLIN-POT CLAVULANATE 875-125 MG PO TABS
1.0000 | ORAL_TABLET | Freq: Two times a day (BID) | ORAL | 0 refills | Status: AC
Start: 2022-07-30 — End: 2022-08-06

## 2022-07-30 MED ORDER — METHYLPREDNISOLONE ACETATE 40 MG/ML IJ SUSP
40.0000 mg | Freq: Once | INTRAMUSCULAR | Status: AC
  Administered 2022-07-30: 40 mg via INTRAMUSCULAR

## 2022-07-30 NOTE — Patient Instructions (Addendum)
MIxed Rhinitis: complicated by an acute sinus infection currently  Sinus infection:  Kenalog 40mg  IM injection given today  Start: Augmentin 1 tab twice a day for 7 days   -Continue avoidance measures of weed, cat, dust - Continue with: Singulair (montelukast) 10mg  daily, Flonase (fluticasone) one spray per nostril daily, and Astelin (azelastine) 2 sprays per nostril 1-2 times daily as needed - Start taking: Zyrtec (cetirizine) 10mg  tablet once daily - You can use an extra dose of the antihistamine, if needed, for breakthrough symptoms.  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     GERD  -Continue dietary and lifestyle modifications -Continue famotidine 20 mg twice daily  Follow up: 3 months   Thank you so much for letting me partake in your care today.  Don't hesitate to reach out if you have any additional concerns!  Roney Marion, MD  [facility] [place], [place]

## 2022-07-30 NOTE — Progress Notes (Signed)
Follow Up Note  RE: Lindsey Williamson MRN: [id number] DOB: 03-26-1951 Date of Office Visit: 07/30/2022  Referring provider: Katherina Mires, MD Primary care provider: Katherina Mires, MD  Chief Complaint: Follow-up and Sinus Problem (Pain/pressure continues; denies fever)  History of Present Illness: I had the pleasure of seeing Lindsey Williamson for a follow up visit at the [facility] [place] on 07/30/2022. She is a 71 y.o. female, who is being followed for mixed rhinitis, cough, GERD. Her previous allergy office visit was on 05/21/22 with Dr. Edison Pace. Today is a regular follow up visit.  History obtained from patient, chart review.  Mixed allergic and nonallergic  Rhinitis: current therapy: flonase, astelin, singulair,  symptoms partially improved symptoms include: nasal congestion, rhinorrhea, and sneezing, sinus pressure, painful eyes and ear fullness has worsened over the past few weeks.  Previous allergy testing: yes: weed, cat, dust  History of reflux/heartburn: yes Interested in Allergy Immunotherapy: no   Cough - has decreased with increased time since smoking cessation.  She walks daily without symptoms/  GERD -on famotidine 20mg  twice a day  - improved, no breakthrough symptoms.   She recently had a screening CT scan of her lungs which showed no recurrence of her lung cancer.  She is due for updated scan in 6 months.    Assessment and Plan: Lindsey Williamson is a 71 y.o. female with: Acute recurrent maxillary sinusitis - Plan: methylPREDNISolone acetate (DEPO-MEDROL) injection 40 mg  Other allergic rhinitis  Gastroesophageal reflux disease without esophagitis  Other cough  Personal history of lung cancer Plan: Patient Instructions  MIxed Rhinitis: complicated by an acute sinus infection currently  Sinus infection:  Kenalog 40mg  IM injection given today  Start: Augmentin 1 tab twice a day for 7 days   -Continue avoidance measures of weed, cat, dust -  Continue with: Singulair (montelukast) 10mg  daily, Flonase (fluticasone) one spray per nostril daily, and Astelin (azelastine) 2 sprays per nostril 1-2 times daily as needed - Start taking: Zyrtec (cetirizine) 10mg  tablet once daily - You can use an extra dose of the antihistamine, if needed, for breakthrough symptoms.  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     GERD  -Continue dietary and lifestyle modifications -Continue famotidine 20 mg twice daily  Follow up: 3 months   Thank you so much for letting me partake in your care today.  Don't hesitate to reach out if you have any additional concerns!  Roney Marion, MD  [facility] [place] [place]    No follow-ups on file.  Meds ordered this encounter  Medications   montelukast (SINGULAIR) 10 MG tablet    Sig: Take 1 tablet (10 mg total) by mouth at bedtime.    Dispense:  90 tablet    Refill:  1   fluticasone (FLONASE) 50 MCG/ACT nasal spray    Sig: Place 2 sprays into both nostrils daily.    Dispense:  16 g    Refill:  3   amoxicillin-clavulanate (AUGMENTIN) 875-125 MG tablet  Sig: Take 1 tablet by mouth 2 (two) times daily for 7 days.    Dispense:  14 tablet    Refill:  0   cetirizine (ZYRTEC ALLERGY) 10 MG tablet    Sig: Take 1 tablet (10 mg total) by mouth daily.    Dispense:  90 tablet    Refill:  1   methylPREDNISolone acetate (DEPO-MEDROL) injection 40 mg    Lab Orders  No laboratory test(s) ordered today   Diagnostics: None performed      Medication List:  Current Outpatient Medications  Medication Sig Dispense Refill   amLODipine (NORVASC) 5 MG tablet Take 5 mg by mouth daily.     amoxicillin-clavulanate (AUGMENTIN) 875-125 MG tablet Take 1 tablet by mouth 2 (two) times daily for 7 days. 14 tablet 0   aspirin EC 81 MG tablet Take 81 mg by mouth daily. Swallow whole.     azelastine (ASTELIN) 0.1 % nasal spray Place 2 sprays into both nostrils 2 (two) times daily as needed for rhinitis. 30 mL 5   cetirizine (ZYRTEC ALLERGY) 10 MG tablet Take 1 tablet (10 mg total) by mouth daily. 90 tablet 1   diclofenac Sodium (VOLTAREN) 1 % GEL Apply 1 application. topically 2 (two) times daily.     famotidine (PEPCID) 20 MG tablet Take 1 tablet (20 mg total) by mouth 2 (two) times daily. 180 tablet 1   JARDIANCE 10 MG TABS tablet Take 10 mg by mouth daily.     lisinopril (ZESTRIL) 40 MG tablet Take 40 mg by mouth daily.     metFORMIN (GLUCOPHAGE-XR) 500 MG 24 hr tablet Take 1,000 mg by mouth 2 (two) times daily.     pravastatin (PRAVACHOL) 40 MG tablet Take 40 mg by mouth daily.     fluticasone (FLONASE) 50 MCG/ACT nasal spray Place 2 sprays into both nostrils daily. 16 g 3   montelukast (SINGULAIR) 10 MG tablet Take 1 tablet (10 mg total) by mouth at bedtime. 90 tablet 1   No current facility-administered medications for this visit.   Allergies: Allergies  Allergen Reactions   Aspirin Palpitations   Azithromycin Palpitations   Codeine Palpitations   I reviewed her past medical history, social history, family history, and environmental history and no significant changes have been reported from her previous visit.  ROS: All others negative except as noted per HPI.   Objective: BP 128/62   Pulse 74   Temp 97.6 F (36.4 C) (Temporal)   Ht 5\' 4"  (1.626 m)   Wt 162 lb (73.5 kg)   SpO2 97%   BMI 27.81 kg/m  Body mass index is 27.81 kg/m. General Appearance:  Alert, cooperative, no distress, appears stated age  Head:   Normocephalic, without obvious abnormality, atraumatic  Eyes:  Conjunctiva clear, EOM's intact  Nose: Nares normal, hypertrophic turbinates, no visible anterior polyps, and septum midline,  Sinus tenderness   Throat: Lips, tongue normal; teeth and gums normal, + cobblestoning  Neck: Supple, symmetrical  Lungs:   clear to auscultation bilaterally, Respirations unlabored, no coughing  Heart:  regular rate and rhythm and no murmur, Appears well perfused  Extremities: No edema  Skin: Skin color, texture, turgor normal, no rashes or lesions on visualized portions of skin   Neurologic: No gross deficits   Previous notes and tests were reviewed. The plan was reviewed with the patient/family, and all questions/concerned were addressed.  It was my pleasure to see Kendall today and participate in her care. Please feel free to  contact me with any questions or concerns.  Sincerely,  Roney Marion, MD  Allergy & Immunology  [facility] Office: [phone]

## 2022-08-20 ENCOUNTER — Telehealth: Payer: Self-pay | Admitting: *Deleted

## 2022-08-20 NOTE — Telephone Encounter (Signed)
CALLED PATIENT TO INFORM OF  I- STAT LABS IN RADIOLOGY ON 09-15-22 AND HER CT TO BE ON 09-15-22- ARRIVAL TIME- 3 PM @ [facility], PATIENT TO HAVE WATER ONLY- 4 HRS. PRIOR TO TEST, PATIENT TO RECEIVE RESULTS FROM ASHLYN BRUNING ON 09-16-22 @ 2 PM VIA TELEPHONE, SPOKE WITH PATIENT AND SHE IS AWARE OF THESE APPTS. AND THE INSTRUCTIONS

## 2022-09-15 ENCOUNTER — Encounter: Payer: Self-pay | Admitting: Urology

## 2022-09-15 ENCOUNTER — Ambulatory Visit (HOSPITAL_COMMUNITY)
Admission: RE | Admit: 2022-09-15 | Discharge: 2022-09-15 | Disposition: A | Discharge status: Home / Self Care | Payer: Medicare Other | Source: Ambulatory Visit | Attending: Urology | Admitting: Urology

## 2022-09-15 DIAGNOSIS — C3431 Malignant neoplasm of lower lobe, right bronchus or lung: Secondary | ICD-10-CM | POA: Diagnosis present

## 2022-09-15 LAB — POCT I-STAT CREATININE: Creatinine, Ser: 0.8 mg/dL (ref 0.44–1.00)

## 2022-09-15 MED ORDER — IOHEXOL 300 MG/ML  SOLN
75.0000 mL | Freq: Once | INTRAMUSCULAR | Status: AC | PRN
  Administered 2022-09-15: 75 mL via INTRAVENOUS

## 2022-09-15 MED ORDER — SODIUM CHLORIDE (PF) 0.9 % IJ SOLN
INTRAMUSCULAR | Status: AC
  Filled 2022-09-15: qty 50

## 2022-09-15 NOTE — Progress Notes (Signed)
Telephone appointment. I verified patient's identity and began nursing interview. Patient reports mild dizziness. No other issues reported at this time.  Meaningful use complete.  Patient aware of 2:00pm-09/16/22 telephone appointment w/ Ashlyn Bruning PA-C. I left my extension [phone] in case patient needs anything. Patient verbalized understanding.  Patient contact [phone]

## 2022-09-16 ENCOUNTER — Ambulatory Visit
Admission: RE | Admit: 2022-09-16 | Discharge: 2022-09-16 | Disposition: A | Discharge status: Home / Self Care | Payer: Medicare Other | Source: Ambulatory Visit | Attending: Urology | Admitting: Urology

## 2022-09-16 ENCOUNTER — Other Ambulatory Visit: Payer: Self-pay | Admitting: Urology

## 2022-09-16 DIAGNOSIS — C3431 Malignant neoplasm of lower lobe, right bronchus or lung: Secondary | ICD-10-CM

## 2022-09-16 DIAGNOSIS — C3491 Malignant neoplasm of unspecified part of right bronchus or lung: Secondary | ICD-10-CM

## 2022-09-16 NOTE — Progress Notes (Signed)
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  Interval Since Last Radiation:  4 months  04/23/22 - 04/29/22:  The RLL lung target was treated to 54 Gy in 3 fractions of 18 Gy  Narrative: I spoke with the patient to conduct her routine scheduled follow up visit to review the results of her recent CT Chest scan via telephone to spare the patient unnecessary potential exposure in the healthcare setting during the current COVID-19 pandemic.  The patient was notified in advance and gave permission to proceed with this visit format.  She tolerated radiation treatment relatively well and remains without complaints.  Her recent CT Chest scan from 09/15/22 shows an excellent response to treatment with interval decrease in size of the previously treated noncalcified nodule in the right lower lobe and no new nodules or evidence of disease progression or recurrence.   We reviewed these results today.                     On review of systems, the patient states that she is doing well in general.  She has been able to quit smoking, with the exception of 1 to 2 cigarettes over the last month which is a major achievement for her. She continues with some mild fatigue but is remaining active, walking 1 to 2 miles per day for exercise.  She specifically denies any increased shortness of breath, chest pain, hemoptysis, productive cough, fevers, chills or night sweats.  She does not have much of an appetite but is eating to maintain her weight and overall, is pleased with her progress to date.  ALLERGIES:  is allergic to aspirin, azithromycin, and codeine.  Meds: Current Outpatient Medications  Medication Sig Dispense Refill    amLODipine (NORVASC) 5 MG tablet Take 5 mg by mouth daily.     aspirin EC 81 MG tablet Take 81 mg by mouth daily. Swallow whole.     azelastine (ASTELIN) 0.1 % nasal spray Place 2 sprays into both nostrils 2 (two) times daily as needed for rhinitis. 30 mL 5   cetirizine (ZYRTEC ALLERGY) 10 MG tablet Take 1 tablet (10 mg total) by mouth daily. 90 tablet 1   diclofenac Sodium (VOLTAREN) 1 % GEL Apply 1 application. topically 2 (two) times daily.     famotidine (PEPCID) 20 MG tablet Take 1 tablet (20 mg total) by mouth 2 (two) times daily. 180 tablet 1   fluticasone (FLONASE) 50 MCG/ACT nasal spray Place 2 sprays into both nostrils daily. 16 g 3   JARDIANCE 10 MG TABS tablet Take 10 mg by mouth daily.     lisinopril (ZESTRIL) 40 MG tablet Take 40 mg by mouth daily.     metFORMIN (GLUCOPHAGE-XR) 500 MG 24 hr tablet Take 1,000 mg by mouth 2 (two) times daily.     montelukast (SINGULAIR) 10 MG tablet Take 1 tablet (10 mg total) by mouth at bedtime. 90 tablet 1   pravastatin (PRAVACHOL) 40 MG tablet Take 40 mg by mouth daily.     No current facility-administered medications for this encounter.    Physical Findings:  vitals were not taken for this visit.  Pain Assessment Pain Score: 0-No pain/10  Unable to assess due to telephone follow-up visit format.  Lab Findings: Lab Results  Component Value Date   WBC 6.7 03/30/2022   HGB 13.0 03/30/2022   HCT 44.0 03/30/2022   MCV 89.8 03/30/2022   PLT 295 03/30/2022     Radiographic Findings: CT Chest W Contrast  Result Date: 09/16/2022 CLINICAL DATA:  Non-small-cell lung cancer. Restaging. * Tracking Code: BO * EXAM: CT CHEST WITH CONTRAST TECHNIQUE: Multidetector CT imaging of the chest was performed during intravenous contrast administration. RADIATION DOSE REDUCTION: This exam was performed according to the departmental dose-optimization program which includes automated exposure control, adjustment of the mA and/or kV according to patient size  and/or use of iterative reconstruction technique. CONTRAST:  16mL OMNIPAQUE IOHEXOL 300 MG/ML  SOLN COMPARISON:  06/11/2022 FINDINGS: Cardiovascular: The heart size is normal. No substantial pericardial effusion. Coronary artery calcification is evident. Mild atherosclerotic calcification is noted in the wall of the thoracic aorta. Mediastinum/Nodes: Similar appearance upper normal mediastinal lymphadenopathy. No left hilar lymphadenopathy. 9 mm right hilar node on 59/2 is stable. Stable11 mm short axis inferior right hilar node on 76/2. The esophagus has normal imaging features. There is no axillary lymphadenopathy. Lungs/Pleura: Centrilobular and paraseptal emphysema evident. Similar 3 mm right perifissural nodule on 66/5. Tiny right middle lobe nodule on 72/5 is stable. Peribronchial cuffing and subpleural reticulation with areas of subpleural consolidative opacities in the posterior right lower lobe are similar to prior. 1.6 x 1.3 cm subpleural nodule at the right lung base previously is 1.2 x 1.2 cm on image 94/5 today. Fiducial marker seen nearby. No new suspicious pulmonary nodule or mass on today's study. There is no evidence of pleural effusion. 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 Musculoskeletal: No worrisome lytic or sclerotic osseous abnormality. IMPRESSION: 1. Stable exam. No new or progressive interval findings. 2. Subpleural nodule at the right lung base is minimally smaller in the interval. 3. Stable upper normal mediastinal and right hilar lymphadenopathy. 4. 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 Continued attention on follow-up recommended. 5. Aortic Atherosclerosis (ICD10-I70.0) and Emphysema (ICD10-J43.9). Electronically Signed   By: Misty Stanley M.D.   On: 09/16/2022 08:33    Impression/Plan: 41. 71 year old female with newly diagnosed stage IA, NSCLC, adenocarcinoma of the right lower  lobe lung. She has recovered well from the effects of her recent stereotactic body radiotherapy to the right lower lobe lung lesion and is currently without complaints.  Her recent posttreatment CT chest scan from 09/15/22 shows an excellent response to treatment with interval decrease in size of the previously treated nodule in the right lower lobe and no new nodules or evidence of disease progression or recurrence. Therefore, we will plan to continue with serial CT Chest scans every 6 months, until we reach 5 years, to monitor for any signs of disease recurrence or progression.  At 5 years disease free, we will transition to annual low dose CT Chest scans. I will connect with her by telephone following each scan to review those results and treatment recommendations.  She appears to have a good understanding of these recommendations and is comfortable and in agreement with the stated plan.  She knows that she is welcome to call at anytime in the interim with any questions or concerns.  I personally spent 20 minutes in this encounter including chart review, reviewing radiological studies, telephone conversation with the patient, entering orders and completing documentation.  Nicholos Johns, PA-C

## 2023-01-21 ENCOUNTER — Encounter: Payer: Self-pay | Admitting: Internal Medicine

## 2023-01-21 ENCOUNTER — Other Ambulatory Visit: Payer: Self-pay

## 2023-01-21 ENCOUNTER — Ambulatory Visit (INDEPENDENT_AMBULATORY_CARE_PROVIDER_SITE_OTHER): Payer: Medicare Other | Admitting: Internal Medicine

## 2023-01-21 VITALS — BP 140/80 | HR 86 | Temp 97.7°F | Ht 64.0 in | Wt 162.2 lb

## 2023-01-21 DIAGNOSIS — J3089 Other allergic rhinitis: Secondary | ICD-10-CM | POA: Diagnosis not present

## 2023-01-21 DIAGNOSIS — H1010 Acute atopic conjunctivitis, unspecified eye: Secondary | ICD-10-CM

## 2023-01-21 DIAGNOSIS — R058 Other specified cough: Secondary | ICD-10-CM

## 2023-01-21 DIAGNOSIS — K219 Gastro-esophageal reflux disease without esophagitis: Secondary | ICD-10-CM

## 2023-01-21 DIAGNOSIS — J0101 Acute recurrent maxillary sinusitis: Secondary | ICD-10-CM

## 2023-01-21 DIAGNOSIS — H1013 Acute atopic conjunctivitis, bilateral: Secondary | ICD-10-CM

## 2023-01-21 DIAGNOSIS — Z85118 Personal history of other malignant neoplasm of bronchus and lung: Secondary | ICD-10-CM

## 2023-01-21 MED ORDER — DOXYCYCLINE MONOHYDRATE 100 MG PO TABS: 100.0000 mg | ORAL_TABLET | Freq: Two times a day (BID) | ORAL | 0 refills | Status: AC

## 2023-01-21 MED ORDER — METHYLPREDNISOLONE ACETATE 40 MG/ML IJ SUSP
40.0000 mg | Freq: Once | INTRAMUSCULAR | Status: AC
  Administered 2023-01-21: 40 mg via INTRAMUSCULAR

## 2023-01-21 MED ORDER — DOXYCYCLINE MONOHYDRATE 100 MG PO TABS
100.0000 mg | ORAL_TABLET | Freq: Two times a day (BID) | ORAL | 0 refills | Status: DC
Start: 2023-01-21 — End: 2023-01-21

## 2023-01-21 NOTE — Patient Instructions (Addendum)
MIxed Rhinitis: complicated by an acute sinus infection currently  Sinus infection:  Kenalog 40mg  IM injection given today  Start: Augmentin 1 tab twice a day for 7 days   -Continue avoidance measures of weed, cat, dust - Continue with: Zyrtec (cetirizine) 10mg  tablet once daily, Singulair (montelukast) 10mg  daily, Flonase (fluticasone) one spray per nostril daily, and Astelin (azelastine) 2 sprays per nostril 1-2 times daily as needed - You can use an extra dose of the antihistamine, if needed, for breakthrough symptoms.  - Consider nasal saline rinses 1-2 times daily to remove allergens from the nasal cavities as well as help with mucous clearance (this is especially helpful to do before the nasal sprays are given)   Cough :  -We will focus on contributing causes to include reflux and rhinitis  -radiation may play a part as well, we will continue to monitor  -Continue to walk for pulmonary rehab -Congratulations for quitting smoking, keep up the good work   GERD  -Continue dietary and lifestyle modifications -Continue famotidine 20 mg twice daily  Follow up:  6 months   Thank you so much for letting me partake in your care today.  Don't hesitate to reach out if you have any additional concerns!  Roney Marion, MD  [facility] [place] [place]

## 2023-01-21 NOTE — Progress Notes (Unsigned)
Follow Up Note  RE: Lindsey Williamson MRN: [id number] DOB: 02/25/1951 Date of Office Visit: 01/21/2023  Referring provider: Katherina Mires, MD Primary care provider: Katherina Mires, MD  Chief Complaint: Headache and Nasal Congestion (Pt c/o ear ache eye pain stuffy and runny nose x 2 weeks.)  History of Present Illness: I had the pleasure of seeing Lindsey Williamson for a follow up visit at the [facility] [place] [facility] [place] on 01/25/2023. She is a 72 y.o. female, who is being followed for mixed rhinitis, cough, GERD. Her previous allergy office visit was on 07/30/2022 with Dr. Edison Pace. Today is a  acute visit .  History obtained from patient, chart review.  Today she reports she had COVID in November and had worsening cough, which has since come back to baseline.  However the past 2 weeks she has increased sinus pressure, eye pain, nasal congestion, drainage for the past week.  These are similar to her previous symptoms when she has been treated for sinus infection.  Reports good response to Augmentin and Kenalog in the past.  GERD is well-controlled with famotidine 20 mg twice daily.  She continues to exercise daily as part of pulmonary rehab from her prior cancer diagnosis.  She has successfully quit smoking and has not had any relapses.  Denies any adverse effects of medication.  Assessment and Plan: Lindsey Williamson is a 72 y.o. female with: Other allergic rhinitis  Acute recurrent maxillary sinusitis  Other cough  Gastroesophageal reflux disease without esophagitis  Personal history of lung cancer  Seasonal allergic conjunctivitis   Plan: Patient Instructions  MIxed Rhinitis: complicated by an acute sinus infection currently  Sinus infection:  Kenalog 40mg  IM injection given today  Start: Augmentin 1 tab twice a day for 7 days   -Continue avoidance measures of weed, cat, dust - Continue with: Zyrtec (cetirizine) 10mg  tablet once daily, Singulair (montelukast) 10mg  daily,  Flonase (fluticasone) one spray per nostril daily, and Astelin (azelastine) 2 sprays per nostril 1-2 times daily as needed - You can use an extra dose of the antihistamine, if needed, for breakthrough symptoms.  - Consider nasal saline rinses 1-2 times daily to remove allergens from the nasal cavities as well as help with mucous clearance (this is especially helpful to do before the nasal sprays are given)   Cough :  -We will focus on contributing causes to include reflux and rhinitis  -radiation may play a part as well, we will continue to monitor  -Continue to walk for pulmonary rehab -Congratulations for quitting smoking, keep up the good work   GERD  -Continue dietary and lifestyle modifications -Continue famotidine 20 mg twice daily  Follow up:  6 months   Thank you so much for letting me partake in your care today.  Don't hesitate to reach out if you have any additional concerns!  Roney Marion, MD  [facility] [place] [place]     Meds ordered this encounter  Medications   DISCONTD: doxycycline (ADOXA) 100 MG tablet    Sig: Take 1 tablet (100 mg total) by mouth 2 (two) times daily for 7 days.    Dispense:  14 tablet    Refill:  0   methylPREDNISolone acetate (DEPO-MEDROL) injection 40 mg   doxycycline (ADOXA) 100 MG tablet    Sig: Take 1 tablet (100 mg total) by mouth 2 (two) times daily for 7 days.    Dispense:  14 tablet    Refill:  0  Lab Orders  No laboratory test(s) ordered today   Diagnostics: None done    Medication List:  Current Outpatient Medications  Medication Sig Dispense Refill   amLODipine (NORVASC) 5 MG tablet Take 5 mg by mouth daily.     aspirin EC 81 MG tablet Take 81 mg by mouth daily. Swallow whole.     azelastine (ASTELIN) 0.1 % nasal spray Place 2 sprays into both nostrils 2 (two) times daily as needed for rhinitis. 30 mL 5   cetirizine (ZYRTEC ALLERGY) 10 MG tablet Take 1 tablet (10 mg total) by mouth daily. 90 tablet  1   diclofenac Sodium (VOLTAREN) 1 % GEL Apply 1 application. topically 2 (two) times daily.     famotidine (PEPCID) 20 MG tablet Take 1 tablet (20 mg total) by mouth 2 (two) times daily. 180 tablet 1   fluticasone (FLONASE) 50 MCG/ACT nasal spray Place 2 sprays into both nostrils daily. 16 g 3   JARDIANCE 10 MG TABS tablet Take 10 mg by mouth daily.     lisinopril (ZESTRIL) 40 MG tablet Take 40 mg by mouth daily.     metFORMIN (GLUCOPHAGE-XR) 500 MG 24 hr tablet Take 1,000 mg by mouth 2 (two) times daily.     pravastatin (PRAVACHOL) 40 MG tablet Take 40 mg by mouth daily.     doxycycline (ADOXA) 100 MG tablet Take 1 tablet (100 mg total) by mouth 2 (two) times daily for 7 days. 14 tablet 0   montelukast (SINGULAIR) 10 MG tablet Take 1 tablet (10 mg total) by mouth at bedtime. (Patient not taking: Reported on 01/21/2023) 90 tablet 1   No current facility-administered medications for this visit.   Allergies: Allergies  Allergen Reactions   Benzonatate Palpitations   Aspirin Palpitations   Azithromycin Palpitations   Codeine Palpitations   I reviewed her past medical history, social history, family history, and environmental history and no significant changes have been reported from her previous visit.  ROS: All others negative except as noted per HPI.   Objective: BP (!) 140/80   Pulse 86   Temp 97.7 F (36.5 C)   Ht 5\' 4"  (1.626 m)   Wt 162 lb 3.2 oz (73.6 kg)   SpO2 97%   BMI 27.84 kg/m  Body mass index is 27.84 kg/m. General Appearance:  Alert, cooperative, no distress, appears stated age  Head:  Normocephalic, without obvious abnormality, atraumatic  Eyes:  Conjunctiva clear, EOM's intact  Nose: Nares normal,  erythematous nasal mucosa with clear yellow rhinorrhea, hypertrophic turbinates, no visible anterior polyps, and septum midline, sinu tenderness bilaterally   Throat: Lips, tongue normal; teeth and gums normal, + cobblestoning  Neck: Supple, symmetrical  Lungs:    clear to auscultation bilaterally, Respirations unlabored, no coughing  Heart:  regular rate and rhythm and no murmur, Appears well perfused  Extremities: No edema  Skin: Skin color, texture, turgor normal, no rashes or lesions on visualized portions of skin  Neurologic: No gross deficits   Previous notes and tests were reviewed. The plan was reviewed with the patient/family, and all questions/concerned were addressed.  It was my pleasure to see Lindsey Williamson today and participate in her care. Please feel free to contact me with any questions or concerns.  Sincerely,  Roney Marion, MD  Allergy & Immunology  [facility] [place] [facility] Office: [phone]

## 2023-02-15 ENCOUNTER — Ambulatory Visit: Payer: Medicare Other | Admitting: Podiatry

## 2023-02-22 ENCOUNTER — Telehealth: Payer: Self-pay | Admitting: Allergy & Immunology

## 2023-02-22 MED ORDER — FLUTICASONE PROPIONATE 50 MCG/ACT NA SUSP: 2.0000 | Freq: Every day | NASAL | 3 refills | Status: DC

## 2023-02-22 MED ORDER — CETIRIZINE HCL 10 MG PO TABS: 10.0000 mg | ORAL_TABLET | Freq: Every day | ORAL | 1 refills | Status: DC

## 2023-02-22 MED ORDER — MONTELUKAST SODIUM 10 MG PO TABS: 10.0000 mg | ORAL_TABLET | Freq: Every day | ORAL | 1 refills | Status: DC

## 2023-02-22 MED ORDER — FAMOTIDINE 20 MG PO TABS
20.0000 mg | ORAL_TABLET | Freq: Two times a day (BID) | ORAL | 1 refills | Status: DC
Start: 2023-02-22 — End: 2024-02-18

## 2023-02-22 MED ORDER — AZELASTINE HCL 0.1 % NA SOLN
2.0000 | Freq: Two times a day (BID) | NASAL | 5 refills | Status: DC | PRN
Start: 2023-02-22 — End: 2024-03-08

## 2023-02-22 NOTE — Telephone Encounter (Signed)
I sent in everything this morning when I talked to her.

## 2023-02-22 NOTE — Telephone Encounter (Signed)
Patient called reporting that she has not gotten her medications from [facility]. She was told that they would be ready, but there was some confusion regarding the medications.   She wants this sent to [facility] on [address]. Confirmed pharmacy and sent this in.   Salvatore Marvel, MD [facility] [place] [facility] [place]

## 2023-02-22 NOTE — Telephone Encounter (Signed)
Reached to pt and stated that all 5  medications that she was requesting have been filled out.

## 2023-03-02 ENCOUNTER — Ambulatory Visit (INDEPENDENT_AMBULATORY_CARE_PROVIDER_SITE_OTHER): Payer: Medicare Other | Admitting: Podiatry

## 2023-03-02 DIAGNOSIS — M79675 Pain in left toe(s): Secondary | ICD-10-CM

## 2023-03-02 DIAGNOSIS — B351 Tinea unguium: Secondary | ICD-10-CM | POA: Diagnosis not present

## 2023-03-02 DIAGNOSIS — M79674 Pain in right toe(s): Secondary | ICD-10-CM | POA: Diagnosis not present

## 2023-03-02 NOTE — Progress Notes (Signed)
  Subjective:  Patient ID: Lindsey Williamson, female    DOB: [birth year],  MRN: [id number]  Chief Complaint  Patient presents with   Diabetes    Nail trim  Pain in the big toe  Pt stated that she gets a burning sensation at night    72 y.o. female returns for the above complaint.  Patient presents with thickened elongated dystrophic toenails x 10 mild pain on palpation hurts with ambulation hurts with pressure.  She would like for me to debride them down she is not able to do it herself.  Objective:  There were no vitals filed for this visit. Podiatric Exam: Vascular: dorsalis pedis and posterior tibial pulses are palpable bilateral. Capillary return is immediate. Temperature gradient is WNL. Skin turgor WNL  Sensorium: Normal Semmes Weinstein monofilament test. Normal tactile sensation bilaterally. Nail Exam: Pt has thick disfigured discolored nails with subungual debris noted bilateral entire nail hallux through fifth toenails.  Pain on palpation to the nails. Ulcer Exam: There is no evidence of ulcer or pre-ulcerative changes or infection. Orthopedic Exam: Muscle tone and strength are WNL. No limitations in general ROM. No crepitus or effusions noted.  Skin: No Porokeratosis. No infection or ulcers    Assessment & Plan:   1. Pain due to onychomycosis of toenails of both feet     Patient was evaluated and treated and all questions answered.  Onychomycosis with pain  -Nails palliatively debrided as below. -Educated on self-care  Procedure: Nail Debridement Rationale: pain  Type of Debridement: manual, sharp debridement. Instrumentation: Nail nipper, rotary burr. Number of Nails: 10  Procedures and Treatment: Consent by patient was obtained for treatment procedures. The patient understood the discussion of treatment and procedures well. All questions were answered thoroughly reviewed. Debridement of mycotic and hypertrophic toenails, 1 through 5 bilateral and clearing of  subungual debris. No ulceration, no infection noted.  Return Visit-Office Procedure: Patient instructed to return to the office for a follow up visit 3 months for continued evaluation and treatment.  Boneta Lucks, DPM    Return in about 3 months (around 06/02/2023) for Routine Foot Care.

## 2023-03-16 ENCOUNTER — Encounter: Payer: Self-pay | Admitting: Urology

## 2023-03-17 ENCOUNTER — Ambulatory Visit (HOSPITAL_COMMUNITY)
Admission: RE | Admit: 2023-03-17 | Discharge: 2023-03-17 | Disposition: A | Discharge status: Home / Self Care | Payer: Medicare Other | Source: Ambulatory Visit | Attending: Urology | Admitting: Urology

## 2023-03-17 ENCOUNTER — Other Ambulatory Visit: Payer: Self-pay | Admitting: Urology

## 2023-03-17 DIAGNOSIS — C3491 Malignant neoplasm of unspecified part of right bronchus or lung: Secondary | ICD-10-CM

## 2023-03-17 MED ORDER — IOHEXOL 300 MG/ML  SOLN
75.0000 mL | Freq: Once | INTRAMUSCULAR | Status: AC | PRN
Start: 2023-03-17 — End: 2023-03-17
  Administered 2023-03-17: 75 mL via INTRAVENOUS

## 2023-03-17 MED ORDER — SODIUM CHLORIDE (PF) 0.9 % IJ SOLN
INTRAMUSCULAR | Status: AC
  Filled 2023-03-17: qty 50

## 2023-03-21 ENCOUNTER — Inpatient Hospital Stay: Payer: Medicare Other | Attending: Radiation Oncology | Admitting: Licensed Clinical Social Worker

## 2023-03-21 DIAGNOSIS — C3431 Malignant neoplasm of lower lobe, right bronchus or lung: Secondary | ICD-10-CM

## 2023-03-21 NOTE — Progress Notes (Signed)
[facility] CSW Progress Note  Clinical Education officer, museum contacted patient by phone to discuss concerns about food insecurity and paying for boost and ensure.  Pt has completed treatment and is not eligible for the [employer].  CSW inquired if pt has contacted Tax adviser to Production manager for [employer] on [employer].  Pt reports she has not signed up for any programs through the senior center.  Telephone number provided and pt encouraged to contact the senior center as she may be eligible for other services as well.  CSW mailed pt coupons to assist w/ paying for ensure/boost.  CSW to remain available as appropriate.      Henriette Combs, LCSW

## 2023-03-22 NOTE — Progress Notes (Signed)
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  Interval Since Last Radiation:  10 months  04/23/22 - 04/29/22:  The RLL lung target was treated to 54 Gy in 3 fractions of 18 Gy  Narrative: I spoke with the patient to conduct her routine scheduled follow up visit to review the results of her recent CT Chest scan via telephone to spare the patient unnecessary potential exposure in the healthcare setting during the current COVID-19 pandemic.  The patient was notified in advance and gave permission to proceed with this visit format.  She tolerated radiation treatment relatively well and remains without complaints.  Her recent CT Chest scan from 03/17/23 shows a stable appearance with evolving post-radiation changes in the posterior RLL lung with stable borderline right hilar lymphadenopathy and a few peri-fissural subpleural nodes felt to be reactive, but no new nodules or evidence of disease progression or recurrence.   We reviewed these results today.                     On review of systems, the patient states that she is doing well in general.  She has been able to quit smoking, with the exception of 1 to 2 cigarettes/day. She continues with some mild fatigue but is remaining active, walking 1 to 2 miles per day for exercise.  She specifically denies any increased shortness of breath, chest pain, hemoptysis, productive cough, fevers, chills or night sweats.  She reports a decent appetite and is eating 3 meals/day to maintain her weight. Overall, she is quite pleased with her progress to date.  ALLERGIES:  is allergic to benzonatate, aspirin, azithromycin, and codeine.  Meds: Current Outpatient Medications   Medication Sig Dispense Refill   amLODipine (NORVASC) 5 MG tablet Take 5 mg by mouth daily.     aspirin EC 81 MG tablet Take 81 mg by mouth daily. Swallow whole.     azelastine (ASTELIN) 0.1 % nasal spray Place 2 sprays into both nostrils 2 (two) times daily as needed for rhinitis. 30 mL 5   cetirizine (ZYRTEC ALLERGY) 10 MG tablet Take 1 tablet (10 mg total) by mouth daily. 90 tablet 1   diclofenac Sodium (VOLTAREN) 1 % GEL Apply 1 application. topically 2 (two) times daily.     famotidine (PEPCID) 20 MG tablet Take 1 tablet (20 mg total) by mouth 2 (two) times daily. 180 tablet 1   fluticasone (FLONASE) 50 MCG/ACT nasal spray Place 2 sprays into both nostrils daily. 16 g 3   JARDIANCE 10 MG TABS tablet Take 10 mg by mouth daily.     lisinopril (ZESTRIL) 40 MG tablet Take 40 mg by mouth daily.     metFORMIN (GLUCOPHAGE-XR) 500 MG 24 hr tablet Take 1,000 mg by mouth 2 (two) times daily.     montelukast (SINGULAIR) 10 MG tablet Take 1 tablet (10 mg total) by mouth at bedtime. 90 tablet 1   pravastatin (PRAVACHOL) 40 MG tablet Take 40 mg by mouth daily.     No current facility-administered medications for this visit.    Physical Findings:  vitals were not taken for this visit.   /10 Unable to assess due to telephone follow-up  visit format.  Lab Findings: Lab Results  Component Value Date   WBC 6.7 03/30/2022   HGB 13.0 03/30/2022   HCT 44.0 03/30/2022   MCV 89.8 03/30/2022   PLT 295 03/30/2022     Radiographic Findings: CT Chest W Contrast  Result Date: 03/21/2023 CLINICAL DATA:  Non-small cell lung cancer, assess treatment response. Status post radiation therapy April 2023. * Tracking Code: BO * EXAM: CT CHEST WITH CONTRAST TECHNIQUE: Multidetector CT imaging of the chest was performed during intravenous contrast administration. RADIATION DOSE REDUCTION: This exam was performed according to the departmental dose-optimization program which includes automated exposure control,  adjustment of the mA and/or kV according to patient size and/or use of iterative reconstruction technique. CONTRAST:  35mL OMNIPAQUE IOHEXOL 300 MG/ML  SOLN COMPARISON:  09/15/2022 and PET 03/17/2022. FINDINGS: Cardiovascular: Atherosclerotic calcification of the aorta, aortic valve and coronary arteries. Enlarged pulmonic trunk and heart. No pericardial effusion. Mediastinum/Nodes: Mediastinal lymph nodes are not enlarged by CT size criteria. Hilar lymph nodes measure up to 13 mm on the right, stable. No axillary adenopathy. Esophagus is grossly unremarkable. Lungs/Pleura: Centrilobular and paraseptal emphysema. Evolving patchy consolidation and ground-glass in the posterior right lower lobe after radiation therapy. Subpleural nodules in the right hemithorax have increased in size slightly in the interval, measuring up to 6 mm along the minor fissure (5/77), previously 4 mm. Subpleural scarring in the posterolateral left lower lobe. No new pulmonary nodules. Debris is seen in the airway. Cylindrical bronchiectasis. No pleural fluid. Upper Abdomen: Visualized portions of the liver and gallbladder are unremarkable. Right adrenal nodule measures 1.5 cm and 70 Hounsfield units. On PET 03/15/2022, it measured 22 Hounsfield units and was not hypermetabolic. No specific follow-up recommended other than on routine imaging. Visualized portions of the left adrenal gland, kidneys and spleen are unremarkable. Mild pancreatic ductal dilatation, 4 mm, unchanged. Visualized portions of the stomach and bowel are grossly unremarkable. No upper abdominal adenopathy. Musculoskeletal: Degenerative changes in the spine. Old L1 compression fracture. IMPRESSION: 1. Evolving changes of radiation therapy in the posterior right lower lobe. Stable borderline right hilar adenopathy. Slight enlargement in perifissural nodules in the right hemithorax may represent reactive subpleural lymph nodes. Recommend attention on follow-up. 2. Probable  right adrenal adenoma, stable from 03/15/2022. 3. Similar mild pancreatic ductal dilatation. Recommend attention on follow-up. 4. Aortic atherosclerosis (ICD10-I70.0). Coronary artery calcification. 5. Enlarged pulmonic trunk, indicative of pulmonary arterial hypertension. 6.  Emphysema (ICD10-J43.9). Electronically Signed   By: Lorin Picket M.D.   On: 03/21/2023 08:12    Impression/Plan: 50. 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 She has recovered well from the effects of her recent stereotactic body radiotherapy to the right lower lobe lung lesion and is currently without complaints.  Her recent posttreatment CT chest scan from 03/17/23 shows a stable appearance with evolving post-radiation changes in the posterior RLL lung with stable borderline right hilar lymphadenopathy and a few peri-fissural subpleural nodes felt to be reactive, but no new nodules or evidence of disease progression or recurrence. Therefore, we will plan to continue with serial CT Chest scans every 6 months, until we reach 5 years, to monitor for any signs of disease recurrence or progression.  At 5 years disease free, we will transition to annual low dose CT Chest scans. I will connect with her by telephone following each scan to review those results and treatment recommendations.  She appears to have a good understanding of these recommendations  and is comfortable and in agreement with the stated plan.  She knows that she is welcome to call at anytime in the interim with any questions or concerns.  I personally spent 20 minutes in this encounter including chart review, reviewing radiological studies, telephone conversation with the patient, entering orders and completing documentation.    Nicholos Johns, PA-C

## 2023-03-23 ENCOUNTER — Ambulatory Visit
Admission: RE | Admit: 2023-03-23 | Discharge: 2023-03-23 | Disposition: A | Discharge status: Home / Self Care | Payer: Medicare Other | Source: Ambulatory Visit | Attending: Urology | Admitting: Urology

## 2023-03-23 ENCOUNTER — Encounter: Payer: Self-pay | Admitting: Urology

## 2023-03-23 DIAGNOSIS — C3431 Malignant neoplasm of lower lobe, right bronchus or lung: Secondary | ICD-10-CM

## 2023-03-23 NOTE — Progress Notes (Addendum)
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 Patient to review most recent CT results from 03/21/23. I verified patient's identity and began nursing interview. Patient reports occasional SOB w/ exertion.   Meaningful use complete.   Patient aware of their 10:00am-03/23/23 telephone appointment w/ Ashlyn Bruning PA-C. I left my extension [phone] in case patient needs anything. Patient verbalized understanding. This concludes the nursing interview.   Patient contact [phone]     Leandra Kern, LPN

## 2023-05-10 ENCOUNTER — Telehealth: Payer: Self-pay | Admitting: *Deleted

## 2023-05-10 NOTE — Telephone Encounter (Signed)
Called patient to inform of Ct for 09-23-23- arrival time- 12:45 pm @ [facility], no restrictions to test, patient to receive results from Ashlyn Bruning on 09-28-23 @ 10 am via telephone, lvm for a return call

## 2023-06-01 ENCOUNTER — Ambulatory Visit (INDEPENDENT_AMBULATORY_CARE_PROVIDER_SITE_OTHER): Payer: Medicare Other | Admitting: Podiatry

## 2023-06-01 DIAGNOSIS — M79675 Pain in left toe(s): Secondary | ICD-10-CM

## 2023-06-01 DIAGNOSIS — M79674 Pain in right toe(s): Secondary | ICD-10-CM | POA: Diagnosis not present

## 2023-06-01 DIAGNOSIS — B351 Tinea unguium: Secondary | ICD-10-CM

## 2023-06-01 NOTE — Progress Notes (Signed)
  Subjective:  Patient ID: Lindsey Williamson, female    DOB: [birth year],  MRN: [id number]  Chief Complaint  Patient presents with   Nail Problem     Routine foot care   72 y.o. female returns for the above complaint.  Patient presents with thickened elongated dystrophic toenails x 10 mild pain on palpation hurts with ambulation hurts with pressure.  She would like for me to debride them down she is not able to do it herself.  Objective:  There were no vitals filed for this visit. Podiatric Exam: Vascular: dorsalis pedis and posterior tibial pulses are palpable bilateral. Capillary return is immediate. Temperature gradient is WNL. Skin turgor WNL  Sensorium: Normal Semmes Weinstein monofilament test. Normal tactile sensation bilaterally. Nail Exam: Pt has thick disfigured discolored nails with subungual debris noted bilateral entire nail hallux through fifth toenails.  Pain on palpation to the nails. Ulcer Exam: There is no evidence of ulcer or pre-ulcerative changes or infection. Orthopedic Exam: Muscle tone and strength are WNL. No limitations in general ROM. No crepitus or effusions noted.  Skin: No Porokeratosis. No infection or ulcers    Assessment & Plan:   1. Pain due to onychomycosis of toenails of both feet      Patient was evaluated and treated and all questions answered.  Onychomycosis with pain  -Nails palliatively debrided as below. -Educated on self-care  Procedure: Nail Debridement Rationale: pain  Type of Debridement: manual, sharp debridement. Instrumentation: Nail nipper, rotary burr. Number of Nails: 10  Procedures and Treatment: Consent by patient was obtained for treatment procedures. The patient understood the discussion of treatment and procedures well. All questions were answered thoroughly reviewed. Debridement of mycotic and hypertrophic toenails, 1 through 5 bilateral and clearing of subungual debris. No ulceration, no infection noted.  Return  Visit-Office Procedure: Patient instructed to return to the office for a follow up visit 3 months for continued evaluation and treatment.  Nicholes Rough, DPM    No follow-ups on file.

## 2023-07-29 ENCOUNTER — Ambulatory Visit: Payer: Medicare Other | Admitting: Internal Medicine

## 2023-08-24 ENCOUNTER — Ambulatory Visit: Payer: Medicare Other | Admitting: Podiatry

## 2023-09-23 ENCOUNTER — Ambulatory Visit (HOSPITAL_COMMUNITY)
Admission: RE | Admit: 2023-09-23 | Discharge: 2023-09-23 | Disposition: A | Discharge status: Home / Self Care | Payer: Medicare Other | Source: Ambulatory Visit | Attending: Urology | Admitting: Urology

## 2023-09-23 DIAGNOSIS — C3431 Malignant neoplasm of lower lobe, right bronchus or lung: Secondary | ICD-10-CM | POA: Insufficient documentation

## 2023-09-23 LAB — POCT I-STAT CREATININE: Creatinine, Ser: 0.7 mg/dL (ref 0.44–1.00)

## 2023-09-23 MED ORDER — SODIUM CHLORIDE (PF) 0.9 % IJ SOLN
INTRAMUSCULAR | Status: AC
  Filled 2023-09-23: qty 50

## 2023-09-23 MED ORDER — IOHEXOL 300 MG/ML  SOLN
75.0000 mL | Freq: Once | INTRAMUSCULAR | Status: AC | PRN
  Administered 2023-09-23: 75 mL via INTRAVENOUS

## 2023-09-27 NOTE — Progress Notes (Signed)
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  Interval Since Last Radiation:  1 year and 5 months  04/23/22 - 04/29/22:  The RLL lung target was treated to 54 Gy in 3 fractions of 18 Gy  Narrative: I spoke with the patient to conduct her routine scheduled follow up visit to review the results of her recent CT Chest scan via telephone to spare the patient unnecessary potential exposure in the healthcare setting during the current COVID-19 pandemic.  The patient was notified in advance and gave permission to proceed with this visit format.  She tolerated radiation treatment relatively well and remains without complaints.  Her recent CT Chest scan from 09/23/23 shows ***a stable appearance with evolving post-radiation changes in the posterior RLL lung with stable borderline right hilar lymphadenopathy and a few peri-fissural subpleural nodes felt to be reactive, but no new nodules or evidence of disease progression or recurrence.   We reviewed these results today.                     On review of systems, the patient states that she is doing well in general.  She has been able to quit smoking, with the exception of 1 to 2 cigarettes/day. She continues with some mild fatigue but is remaining active, walking 1 to 2 miles per day for exercise.  She specifically denies any increased shortness of breath, chest pain, hemoptysis, productive cough, fevers, chills or night sweats.  She reports a decent appetite and is eating 3 meals/day to maintain her weight. Overall, she is quite pleased with her progress to date.  ALLERGIES:  is allergic to benzonatate, aspirin, azithromycin, and codeine.  Meds: Current Outpatient Medications   Medication Sig Dispense Refill   amLODipine (NORVASC) 5 MG tablet Take 5 mg by mouth daily.     aspirin EC 81 MG tablet Take 81 mg by mouth daily. Swallow whole.     azelastine (ASTELIN) 0.1 % nasal spray Place 2 sprays into both nostrils 2 (two) times daily as needed for rhinitis. 30 mL 5   cetirizine (ZYRTEC ALLERGY) 10 MG tablet Take 1 tablet (10 mg total) by mouth daily. 90 tablet 1   diclofenac Sodium (VOLTAREN) 1 % GEL Apply 1 application. topically 2 (two) times daily.     famotidine (PEPCID) 20 MG tablet Take 1 tablet (20 mg total) by mouth 2 (two) times daily. 180 tablet 1   fluticasone (FLONASE) 50 MCG/ACT nasal spray Place 2 sprays into both nostrils daily. 16 g 3   JARDIANCE 10 MG TABS tablet Take 10 mg by mouth daily.     lisinopril (ZESTRIL) 40 MG tablet Take 40 mg by mouth daily.     metFORMIN (GLUCOPHAGE-XR) 500 MG 24 hr tablet Take 1,000 mg by mouth 2 (two) times daily.     montelukast (SINGULAIR) 10 MG tablet Take 1 tablet (10 mg total) by mouth at bedtime. 90 tablet 1   pravastatin (PRAVACHOL) 40 MG tablet Take 40 mg by mouth daily.     No current facility-administered medications for this visit.    Physical Findings:  vitals were not taken for this visit.   /10 Unable to assess due to telephone  follow-up visit format.  Lab Findings: Lab Results  Component Value Date   WBC 6.7 03/30/2022   HGB 13.0 03/30/2022   HCT 44.0 03/30/2022   MCV 89.8 03/30/2022   PLT 295 03/30/2022     Radiographic Findings: No results found.  Impression/Plan: 94. 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 She has recovered well from the effects of her recent stereotactic body radiotherapy to the right lower lobe lung lesion and is currently without complaints.  Her recent posttreatment CT chest scan from 03/17/23 shows a stable appearance with evolving post-radiation changes in the posterior RLL lung with stable borderline  right hilar lymphadenopathy and a few peri-fissural subpleural nodes felt to be reactive, but no new nodules or evidence of disease progression or recurrence. Therefore, we will plan to continue with serial CT Chest scans every 6 months, until we reach 5 years, to monitor for any signs of disease recurrence or progression.  At 5 years disease free, we will transition to annual low dose CT Chest scans. I will connect with her by telephone following each scan to review those results and treatment recommendations.  She appears to have a good understanding of these recommendations and is comfortable and in agreement with the stated plan.  She knows that she is welcome to call at anytime in the interim with any questions or concerns.  I personally spent 20 minutes in this encounter including chart review, reviewing radiological studies, telephone conversation with the patient, entering orders and completing documentation.    Marguarite Arbour, PA-C

## 2023-09-28 ENCOUNTER — Encounter: Payer: Self-pay | Admitting: Urology

## 2023-09-28 ENCOUNTER — Ambulatory Visit
Admission: RE | Admit: 2023-09-28 | Discharge: 2023-09-28 | Disposition: A | Discharge status: Home / Self Care | Payer: Medicare Other | Source: Ambulatory Visit | Attending: Urology | Admitting: Urology

## 2023-09-28 DIAGNOSIS — C3431 Malignant neoplasm of lower lobe, right bronchus or lung: Secondary | ICD-10-CM

## 2023-09-28 NOTE — Progress Notes (Signed)
Telephone nursing appointment for review of most recent scan results. I verified patient's identity x2 and began nursing interview.   Patient reports doing well. No other issues conveyed at this time.   Meaningful use complete.   Patient aware of their 10am telephone appointment w/ Ashlyn Bruning PA-C. I left my extension [phone] in case patient needs anything. Patient verbalized understanding. This concludes the nursing interview.   Patient contact [phone]     Ruel Favors, LPN

## 2023-09-29 ENCOUNTER — Telehealth: Payer: Self-pay | Admitting: *Deleted

## 2023-09-29 NOTE — Telephone Encounter (Signed)
CALLED PATIENT TO INFORM OF PET SCAN FOR 10-10-23- ARRIVAL TIME- 7 AM @ [facility], PATIENT TO HAVE WATER ONLY- 6 HRS. PRIOR TO TEST, PATIENT TO ABSTAIN FROM CARBS THE DINNER MEAL OF THE EVENING BEFORE SCAN, PATIENT TO HAVE FU APPT. WITH DR. MANNING AND ASHLYN ON 10-13-23 @ 2PM TO RECEIVE RESULTS SPOKE WITH PATIENT AND SHE AWARE OF THESE APPTS. AND THE INSTRUCTIONS

## 2023-10-10 ENCOUNTER — Ambulatory Visit (HOSPITAL_COMMUNITY)
Admission: RE | Admit: 2023-10-10 | Discharge: 2023-10-10 | Disposition: A | Discharge status: Home / Self Care | Payer: Medicare Other | Source: Ambulatory Visit | Attending: Urology | Admitting: Urology

## 2023-10-10 DIAGNOSIS — C3431 Malignant neoplasm of lower lobe, right bronchus or lung: Secondary | ICD-10-CM | POA: Insufficient documentation

## 2023-10-10 LAB — GLUCOSE, CAPILLARY: Glucose-Capillary: 167 mg/dL — ABNORMAL HIGH (ref 70–99)

## 2023-10-10 MED ORDER — FLUDEOXYGLUCOSE F - 18 (FDG) INJECTION
7.7500 | Freq: Once | INTRAVENOUS | Status: AC
  Administered 2023-10-10: 7.75 via INTRAVENOUS

## 2023-10-13 ENCOUNTER — Encounter: Payer: Self-pay | Admitting: Urology

## 2023-10-13 ENCOUNTER — Other Ambulatory Visit: Payer: Self-pay | Admitting: Emergency Medicine

## 2023-10-13 ENCOUNTER — Telehealth: Payer: Self-pay | Admitting: *Deleted

## 2023-10-13 ENCOUNTER — Other Ambulatory Visit: Payer: Self-pay

## 2023-10-13 ENCOUNTER — Ambulatory Visit
Admission: RE | Admit: 2023-10-13 | Discharge: 2023-10-13 | Disposition: A | Discharge status: Home / Self Care | Payer: Medicare Other | Source: Ambulatory Visit | Attending: Urology | Admitting: Urology

## 2023-10-13 ENCOUNTER — Ambulatory Visit
Admission: RE | Admit: 2023-10-13 | Discharge: 2023-10-13 | Disposition: A | Discharge status: Home / Self Care | Payer: Medicare Other | Source: Ambulatory Visit | Attending: Radiation Oncology | Admitting: Radiation Oncology

## 2023-10-13 VITALS — BP 147/70 | HR 82 | Temp 97.3°F | Resp 20 | Ht 64.0 in | Wt 152.0 lb

## 2023-10-13 DIAGNOSIS — C7801 Secondary malignant neoplasm of right lung: Secondary | ICD-10-CM | POA: Diagnosis not present

## 2023-10-13 DIAGNOSIS — C3491 Malignant neoplasm of unspecified part of right bronchus or lung: Secondary | ICD-10-CM

## 2023-10-13 DIAGNOSIS — I6782 Cerebral ischemia: Secondary | ICD-10-CM | POA: Diagnosis not present

## 2023-10-13 DIAGNOSIS — I2510 Atherosclerotic heart disease of native coronary artery without angina pectoris: Secondary | ICD-10-CM | POA: Diagnosis not present

## 2023-10-13 DIAGNOSIS — K5730 Diverticulosis of large intestine without perforation or abscess without bleeding: Secondary | ICD-10-CM | POA: Diagnosis not present

## 2023-10-13 DIAGNOSIS — J432 Centrilobular emphysema: Secondary | ICD-10-CM | POA: Diagnosis not present

## 2023-10-13 DIAGNOSIS — R911 Solitary pulmonary nodule: Secondary | ICD-10-CM

## 2023-10-13 DIAGNOSIS — R918 Other nonspecific abnormal finding of lung field: Secondary | ICD-10-CM

## 2023-10-13 DIAGNOSIS — I700 Atherosclerosis of aorta: Secondary | ICD-10-CM | POA: Diagnosis not present

## 2023-10-13 DIAGNOSIS — Z79899 Other long term (current) drug therapy: Secondary | ICD-10-CM | POA: Insufficient documentation

## 2023-10-13 DIAGNOSIS — C3431 Malignant neoplasm of lower lobe, right bronchus or lung: Secondary | ICD-10-CM

## 2023-10-13 DIAGNOSIS — Z7982 Long term (current) use of aspirin: Secondary | ICD-10-CM | POA: Diagnosis not present

## 2023-10-13 DIAGNOSIS — Z7984 Long term (current) use of oral hypoglycemic drugs: Secondary | ICD-10-CM | POA: Insufficient documentation

## 2023-10-13 NOTE — Telephone Encounter (Signed)
CALLED PATIENT TO INFORM OF MRI FOR 10-19-23- ARRIVAL TIME- 6:30 PM @ [facility],NO RESTRICTIONS TO TEST, PATIENT TO RECEIVE RESULTS FROM ASHLYN BRUNING ON 10-20-23 @ 11 AM, SPOKE WITH PATIENT AND SHE IS AWARE OF THESE APPTS. AND THE INSTRUCTIONS

## 2023-10-13 NOTE — Progress Notes (Signed)
Patient discussed in thoracic oncology conference this morning.  She has evolving right basilar opacity, right hilar mass.  She is all radiation oncology today and is agreeable to bronchoscopy.  I placed orders to set this up ASAP, hopefully 10/17/2023

## 2023-10-13 NOTE — Progress Notes (Addendum)
Nursing appointment for review of PET scan results. I verified patient's identity x2 and began nursing interview.   Patient reports coughing and high anxiety 10/10, but is doing okay. Patient denies SOB and chest pain at this time.   Meaningful use complete.  Vitals- BP (!) 147/70 (BP Location: Right Arm, Patient Position: Sitting, Cuff Size: Normal)   Pulse 82   Temp (!) 97.3 F (36.3 C) (Temporal)   Resp 20   Ht 5\' 4"  (1.626 m)   Wt 152 lb (68.9 kg)   SpO2 97%   BMI 26.09 kg/m    Patient aware of their 2pm appointment w/ Ashlyn PA-C. Patient verbalized understanding during interview. This concludes the interaction.   Patient contact [phone]     Ruel Favors, LPN

## 2023-10-14 ENCOUNTER — Encounter: Payer: Self-pay | Admitting: Emergency Medicine

## 2023-10-14 NOTE — Progress Notes (Signed)
Message left for patient made aware of where to come, what time to be here, medication instructions, and other instructions regarding bathing, dressing, make up, jewelry, and nail care   PCP - [name], MD   Cardiologist -   PPM/ICD -  Device Orders -  Rep Notified -   Chest CT 09-23-23 EKG - 12-2322 Stress Test - 02-2023 ECHO -  Cardiac Cath - 03-30-23  CPAP -   GLP-1 -  Fasting Blood Sugar -  Checks Blood Sugar /day metFORMIN (GLUCOPHAGE-XR) DO NOT TAKE MORNING OF PROCEDURE JARDIANCE  10-13-23   Blood Thinner Instructions:  Aspirin Instructions: Stop 2 days prior to procedure Last dose 10-14-23  ERAS Protcol - NPO  COVID TEST- n/a  Anesthesia review: yes  Patient verbally denies any shortness of breath, fever, cough and chest pain during phone call   -------------  SDW INSTRUCTIONS given:  Your procedure is scheduled on October 17, 2023.  Report to [facility] Main Entrance "A" at 11:00 A.M., and check in at the Admitting office.  Call this number if you have problems the morning of surgery:  [phone]   Remember:  Do not eat Drink after midnight the night before your surgery      Take these medicines the morning of surgery with A SIP OF WATER  amLODipine (NORVASC)  cetirizine (ZYRTEC ALLERGY)  famotidine (PEPCID)  fluticasone (FLONASE)  pravastatin (PRAVACHOL)   IF NEEDED azelastine (ASTELIN)   WHAT DO I DO ABOUT MY DIABETES MEDICATION?   Do not take oral diabetes medicines (pills) the morning of surgery.        metFORMIN (GLUCOPHAGE-XR)   JARDIANCE LAST DOSE 10-14-23         The day of surgery, do not take other diabetes injectables, including Byetta (exenatide), Bydureon (exenatide ER), Victoza (liraglutide), or Trulicity (dulaglutide).  If your CBG is greater than 220 mg/dL, you may take  of your sliding scale (correction) dose of insulin.   HOW TO MANAGE YOUR DIABETES BEFORE AND AFTER SURGERY  Why is it important to control my  blood sugar before and after surgery? Improving blood sugar levels before and after surgery helps healing and can limit problems. A way of improving blood sugar control is eating a healthy diet by:  Eating less sugar and carbohydrates  Increasing activity/exercise  Talking with your doctor about reaching your blood sugar goals High blood sugars (greater than 180 mg/dL) can raise your risk of infections and slow your recovery, so you will need to focus on controlling your diabetes during the weeks before surgery. Make sure that the doctor who takes care of your diabetes knows about your planned surgery including the date and location.  How do I manage my blood sugar before surgery? Check your blood sugar at least 4 times a day, starting 2 days before surgery, to make sure that the level is not too high or low.  Check your blood sugar the morning of your surgery when you wake up and every 2 hours until you get to the Short Stay unit.  If your blood sugar is less than 70 mg/dL, you will need to treat for low blood sugar: Do not take insulin. Treat a low blood sugar (less than 70 mg/dL) with  cup of clear juice (cranberry or apple), 4 glucose tablets, OR glucose gel. Recheck blood sugar in 15 minutes after treatment (to make sure it is greater than 70 mg/dL). If your blood sugar is not greater than 70 mg/dL on  recheck, call [phone] for further instructions. Report your blood sugar to the short stay nurse when you get to Short Stay.  If you are admitted to the hospital after surgery: Your blood sugar will be checked by the staff and you will probably be given insulin after surgery (instead of oral diabetes medicines) to make sure you have good blood sugar levels. The goal for blood sugar control after surgery is 80-180 mg/dL.   As of today, STOP taking any Aspirin (unless otherwise instructed by your surgeon) Aleve, Naproxen, Ibuprofen, Motrin, Advil, Goody's, BC's, all herbal medications,  fish oil, and all vitamins.  THIS INCLUDES YOUR diclofenac Sodium (VOLTAREN).                      Do not wear jewelry, make up, or nail polish            Do not wear lotions, powders, perfumes/colognes, or deodorant.            Do not shave 48 hours prior to surgery.  Men may shave face and neck.            Do not bring valuables to the hospital.            [facility] is not responsible for any belongings or valuables.  Do NOT Smoke (Tobacco/Vaping) 24 hours prior to your procedure If you use a CPAP at night, you may bring all equipment for your overnight stay.   Contacts, glasses, dentures or bridgework may not be worn into surgery.      For patients admitted to the hospital, discharge time will be determined by your treatment team.   Patients discharged the day of surgery will not be allowed to drive home, and someone needs to stay with them for 24 hours.    Special instructions:   - Preparing For Surgery  Before surgery, you can play an important role. Because skin is not sterile, your skin needs to be as free of germs as possible. You can reduce the number of germs on your skin by washing with CHG (chlorahexidine gluconate) Soap before surgery.  CHG is an antiseptic cleaner which kills germs and bonds with the skin to continue killing germs even after washing.    Oral Hygiene is also important to reduce your risk of infection.  Remember - BRUSH YOUR TEETH THE MORNING OF SURGERY WITH YOUR REGULAR TOOTHPASTE  Please do not use if you have an allergy to CHG or antibacterial soaps. If your skin becomes reddened/irritated stop using the CHG.  Do not shave (including legs and underarms) for at least 48 hours prior to first CHG shower. It is OK to shave your face.  Please follow these instructions carefully.   Shower the NIGHT BEFORE SURGERY and the MORNING OF SURGERY with DIAL Soap.   Pat yourself dry with a CLEAN TOWEL.  Wear CLEAN PAJAMAS to bed the night before  surgery  Place CLEAN SHEETS on your bed the night of your first shower and DO NOT SLEEP WITH PETS.   Day of Surgery: Please shower morning of surgery  Wear Clean/Comfortable clothing the morning of surgery Do not apply any deodorants/lotions.   Remember to brush your teeth WITH YOUR REGULAR TOOTHPASTE.   Questions were answered. Patient verbalized understanding of instructions.

## 2023-10-16 NOTE — Plan of Care (Signed)
[facility] Tonsillectomy/Adenoidectomy, Postoperative PEDS care plan entered in error.

## 2023-10-17 ENCOUNTER — Encounter (HOSPITAL_COMMUNITY): Payer: Self-pay

## 2023-10-17 ENCOUNTER — Ambulatory Visit (HOSPITAL_COMMUNITY): Payer: Medicare Other

## 2023-10-17 ENCOUNTER — Telehealth: Payer: Self-pay | Admitting: Adult Health

## 2023-10-17 ENCOUNTER — Ambulatory Visit (HOSPITAL_BASED_OUTPATIENT_CLINIC_OR_DEPARTMENT_OTHER)
Admission: RE | Admit: 2023-10-17 | Discharge: 2023-10-17 | Disposition: A | Discharge status: Home / Self Care | Payer: Medicare Other | Source: Home / Self Care | Attending: Emergency Medicine | Admitting: Emergency Medicine

## 2023-10-17 ENCOUNTER — Encounter (HOSPITAL_COMMUNITY)
Admission: RE | Disposition: A | Discharge status: Home / Self Care | Payer: Self-pay | Source: Home / Self Care | Attending: Emergency Medicine

## 2023-10-17 ENCOUNTER — Observation Stay (HOSPITAL_COMMUNITY)
Admission: EM | Admit: 2023-10-17 | Discharge: 2023-10-18 | Disposition: A | Discharge status: Home / Self Care | Payer: Medicare Other | Attending: Emergency Medicine | Admitting: Emergency Medicine

## 2023-10-17 ENCOUNTER — Ambulatory Visit (HOSPITAL_BASED_OUTPATIENT_CLINIC_OR_DEPARTMENT_OTHER): Payer: Medicare Other | Admitting: Anesthesiology

## 2023-10-17 ENCOUNTER — Ambulatory Visit (HOSPITAL_COMMUNITY): Payer: Medicare Other | Admitting: Anesthesiology

## 2023-10-17 ENCOUNTER — Other Ambulatory Visit: Payer: Self-pay

## 2023-10-17 ENCOUNTER — Encounter (HOSPITAL_COMMUNITY): Payer: Self-pay | Admitting: Emergency Medicine

## 2023-10-17 DIAGNOSIS — Z7982 Long term (current) use of aspirin: Secondary | ICD-10-CM | POA: Insufficient documentation

## 2023-10-17 DIAGNOSIS — R0600 Dyspnea, unspecified: Secondary | ICD-10-CM

## 2023-10-17 DIAGNOSIS — C771 Secondary and unspecified malignant neoplasm of intrathoracic lymph nodes: Secondary | ICD-10-CM | POA: Insufficient documentation

## 2023-10-17 DIAGNOSIS — Z7984 Long term (current) use of oral hypoglycemic drugs: Secondary | ICD-10-CM | POA: Insufficient documentation

## 2023-10-17 DIAGNOSIS — T783XXA Angioneurotic edema, initial encounter: Principal | ICD-10-CM | POA: Diagnosis present

## 2023-10-17 DIAGNOSIS — I10 Essential (primary) hypertension: Secondary | ICD-10-CM | POA: Insufficient documentation

## 2023-10-17 DIAGNOSIS — Z79899 Other long term (current) drug therapy: Secondary | ICD-10-CM | POA: Insufficient documentation

## 2023-10-17 DIAGNOSIS — X58XXXA Exposure to other specified factors, initial encounter: Secondary | ICD-10-CM | POA: Diagnosis not present

## 2023-10-17 DIAGNOSIS — Z85118 Personal history of other malignant neoplasm of bronchus and lung: Secondary | ICD-10-CM | POA: Diagnosis not present

## 2023-10-17 DIAGNOSIS — R918 Other nonspecific abnormal finding of lung field: Secondary | ICD-10-CM | POA: Diagnosis not present

## 2023-10-17 DIAGNOSIS — C3431 Malignant neoplasm of lower lobe, right bronchus or lung: Secondary | ICD-10-CM | POA: Diagnosis present

## 2023-10-17 DIAGNOSIS — Z87891 Personal history of nicotine dependence: Secondary | ICD-10-CM | POA: Diagnosis not present

## 2023-10-17 DIAGNOSIS — E119 Type 2 diabetes mellitus without complications: Secondary | ICD-10-CM | POA: Insufficient documentation

## 2023-10-17 DIAGNOSIS — R0602 Shortness of breath: Secondary | ICD-10-CM

## 2023-10-17 HISTORY — PX: FINE NEEDLE ASPIRATION: SHX6590

## 2023-10-17 HISTORY — PX: BRONCHIAL BRUSHINGS: SHX5108

## 2023-10-17 HISTORY — DX: Angioneurotic edema, initial encounter: T78.3XXA

## 2023-10-17 HISTORY — PX: VIDEO BRONCHOSCOPY WITH ENDOBRONCHIAL ULTRASOUND: SHX6177

## 2023-10-17 HISTORY — PX: BRONCHIAL NEEDLE ASPIRATION BIOPSY: SHX5106

## 2023-10-17 HISTORY — PX: HEMOSTASIS CONTROL: SHX6838

## 2023-10-17 HISTORY — PX: BRONCHIAL BIOPSY: SHX5109

## 2023-10-17 LAB — CBC WITH DIFFERENTIAL/PLATELET
Abs Immature Granulocytes: 0.03 10*3/uL (ref 0.00–0.07)
Basophils Absolute: 0 10*3/uL (ref 0.0–0.1)
Basophils Relative: 0 %
Eosinophils Absolute: 0 10*3/uL (ref 0.0–0.5)
Eosinophils Relative: 0 %
HCT: 38.8 % (ref 36.0–46.0)
Hemoglobin: 11.9 g/dL — ABNORMAL LOW (ref 12.0–15.0)
Immature Granulocytes: 0 %
Lymphocytes Relative: 11 %
Lymphs Abs: 0.9 10*3/uL (ref 0.7–4.0)
MCH: 26.3 pg (ref 26.0–34.0)
MCHC: 30.7 g/dL (ref 30.0–36.0)
MCV: 85.7 fL (ref 80.0–100.0)
Monocytes Absolute: 0.2 10*3/uL (ref 0.1–1.0)
Monocytes Relative: 2 %
Neutro Abs: 6.9 10*3/uL (ref 1.7–7.7)
Neutrophils Relative %: 87 %
Platelets: 519 10*3/uL — ABNORMAL HIGH (ref 150–400)
RBC: 4.53 MIL/uL (ref 3.87–5.11)
RDW: 13.9 % (ref 11.5–15.5)
WBC: 7.9 10*3/uL (ref 4.0–10.5)
nRBC: 0 % (ref 0.0–0.2)

## 2023-10-17 LAB — CBC
HCT: 37.2 % (ref 36.0–46.0)
Hemoglobin: 11.5 g/dL — ABNORMAL LOW (ref 12.0–15.0)
MCH: 26.2 pg (ref 26.0–34.0)
MCHC: 30.9 g/dL (ref 30.0–36.0)
MCV: 84.7 fL (ref 80.0–100.0)
Platelets: 476 10*3/uL — ABNORMAL HIGH (ref 150–400)
RBC: 4.39 MIL/uL (ref 3.87–5.11)
RDW: 13.9 % (ref 11.5–15.5)
WBC: 7.6 10*3/uL (ref 4.0–10.5)
nRBC: 0 % (ref 0.0–0.2)

## 2023-10-17 LAB — BASIC METABOLIC PANEL
Anion gap: 9 (ref 5–15)
BUN: 11 mg/dL (ref 8–23)
CO2: 23 mmol/L (ref 22–32)
Calcium: 8.9 mg/dL (ref 8.9–10.3)
Chloride: 103 mmol/L (ref 98–111)
Creatinine, Ser: 0.73 mg/dL (ref 0.44–1.00)
GFR, Estimated: >60 mL/min (ref >60)
Glucose, Bld: 180 mg/dL — ABNORMAL HIGH (ref 70–99)
Potassium: 3.5 mmol/L (ref 3.5–5.1)
Sodium: 135 mmol/L (ref 135–145)

## 2023-10-17 LAB — COMPREHENSIVE METABOLIC PANEL
ALT: 8 U/L (ref 0–44)
AST: 10 U/L — ABNORMAL LOW (ref 15–41)
Albumin: 3 g/dL — ABNORMAL LOW (ref 3.5–5.0)
Alkaline Phosphatase: 55 U/L (ref 38–126)
Anion gap: 12 (ref 5–15)
BUN: 16 mg/dL (ref 8–23)
CO2: 22 mmol/L (ref 22–32)
Calcium: 8.9 mg/dL (ref 8.9–10.3)
Chloride: 100 mmol/L (ref 98–111)
Creatinine, Ser: 0.66 mg/dL (ref 0.44–1.00)
GFR, Estimated: >60 mL/min (ref >60)
Glucose, Bld: 249 mg/dL — ABNORMAL HIGH (ref 70–99)
Potassium: 4.2 mmol/L (ref 3.5–5.1)
Sodium: 134 mmol/L — ABNORMAL LOW (ref 135–145)
Total Bilirubin: 0.6 mg/dL (ref 0.3–1.2)
Total Protein: 8.3 g/dL — ABNORMAL HIGH (ref 6.5–8.1)

## 2023-10-17 LAB — GLUCOSE, CAPILLARY
Glucose-Capillary: 144 mg/dL — ABNORMAL HIGH (ref 70–99)
Glucose-Capillary: 173 mg/dL — ABNORMAL HIGH (ref 70–99)
Glucose-Capillary: 237 mg/dL — ABNORMAL HIGH (ref 70–99)
Glucose-Capillary: 238 mg/dL — ABNORMAL HIGH (ref 70–99)

## 2023-10-17 LAB — CBG MONITORING, ED: Glucose-Capillary: 246 mg/dL — ABNORMAL HIGH (ref 70–99)

## 2023-10-17 LAB — MRSA NEXT GEN BY PCR, NASAL: MRSA by PCR Next Gen: NOT DETECTED

## 2023-10-17 SURGERY — BRONCHOSCOPY, WITH BIOPSY USING ELECTROMAGNETIC NAVIGATION
Anesthesia: General | Laterality: Right

## 2023-10-17 MED ORDER — INSULIN ASPART 100 UNIT/ML IJ SOLN
0.0000 [IU] | INTRAMUSCULAR | Status: DC | PRN
Start: 2023-10-17 — End: 2023-10-17

## 2023-10-17 MED ORDER — METHYLPREDNISOLONE SODIUM SUCC 125 MG IJ SOLR
125.0000 mg | Freq: Once | INTRAMUSCULAR | Status: DC
  Filled 2023-10-17: qty 2

## 2023-10-17 MED ORDER — DIPHENHYDRAMINE HCL 50 MG/ML IJ SOLN
25.0000 mg | Freq: Once | INTRAMUSCULAR | Status: AC
  Administered 2023-10-17: 25 mg via INTRAVENOUS

## 2023-10-17 MED ORDER — ONDANSETRON HCL 4 MG/2ML IJ SOLN
INTRAMUSCULAR | Status: DC | PRN
  Administered 2023-10-17: 4 mg via INTRAVENOUS

## 2023-10-17 MED ORDER — CHLORHEXIDINE GLUCONATE 0.12 % MT SOLN
15.0000 mL | Freq: Once | OROMUCOSAL | Status: AC
  Administered 2023-10-17: 15 mL via OROMUCOSAL

## 2023-10-17 MED ORDER — FAMOTIDINE IN NACL 20-0.9 MG/50ML-% IV SOLN
20.0000 mg | Freq: Once | INTRAVENOUS | Status: AC
Start: 2023-10-17 — End: 2023-10-17
  Administered 2023-10-17: 20 mg via INTRAVENOUS
  Filled 2023-10-17: qty 50

## 2023-10-17 MED ORDER — CHLORHEXIDINE GLUCONATE CLOTH 2 % EX PADS
6.0000 | MEDICATED_PAD | Freq: Every day | CUTANEOUS | Status: DC
  Administered 2023-10-18: 6 via TOPICAL

## 2023-10-17 MED ORDER — ACETAMINOPHEN 500 MG PO TABS
1000.0000 mg | ORAL_TABLET | Freq: Once | ORAL | Status: AC
  Administered 2023-10-17: 1000 mg via ORAL
  Filled 2023-10-17: qty 2

## 2023-10-17 MED ORDER — CHLORHEXIDINE GLUCONATE 0.12 % MT SOLN
OROMUCOSAL | Status: AC
  Filled 2023-10-17: qty 15

## 2023-10-17 MED ORDER — ACETAMINOPHEN 325 MG PO TABS: 650.0000 mg | ORAL_TABLET | Freq: Four times a day (QID) | ORAL | Status: DC | PRN

## 2023-10-17 MED ORDER — DEXAMETHASONE SODIUM PHOSPHATE 10 MG/ML IJ SOLN
10.0000 mg | Freq: Once | INTRAMUSCULAR | Status: AC
  Administered 2023-10-17: 10 mg via INTRAVENOUS
  Filled 2023-10-17: qty 1

## 2023-10-17 MED ORDER — EPINEPHRINE 1 MG/10ML IJ SOSY
PREFILLED_SYRINGE | INTRAMUSCULAR | Status: AC
  Filled 2023-10-17: qty 10

## 2023-10-17 MED ORDER — ACETAMINOPHEN 160 MG/5ML PO SOLN
650.0000 mg | Freq: Four times a day (QID) | ORAL | Status: DC | PRN
  Administered 2023-10-18: 650 mg via ORAL
  Filled 2023-10-17: qty 20.3

## 2023-10-17 MED ORDER — DIPHENHYDRAMINE HCL 50 MG/ML IJ SOLN
25.0000 mg | Freq: Once | INTRAMUSCULAR | Status: DC
  Filled 2023-10-17: qty 1

## 2023-10-17 MED ORDER — DEXAMETHASONE SODIUM PHOSPHATE 10 MG/ML IJ SOLN
INTRAMUSCULAR | Status: DC | PRN
  Administered 2023-10-17: 5 mg via INTRAVENOUS

## 2023-10-17 MED ORDER — INSULIN ASPART 100 UNIT/ML IJ SOLN
0.0000 [IU] | Freq: Every day | INTRAMUSCULAR | Status: DC
  Administered 2023-10-18: 2 [IU] via SUBCUTANEOUS

## 2023-10-17 MED ORDER — ROCURONIUM BROMIDE 10 MG/ML (PF) SYRINGE
PREFILLED_SYRINGE | INTRAVENOUS | Status: DC | PRN
  Administered 2023-10-17: 40 mg via INTRAVENOUS

## 2023-10-17 MED ORDER — INSULIN ASPART 100 UNIT/ML IJ SOLN
0.0000 [IU] | Freq: Three times a day (TID) | INTRAMUSCULAR | Status: DC
  Administered 2023-10-18: 5 [IU] via SUBCUTANEOUS
  Administered 2023-10-18: 8 [IU] via SUBCUTANEOUS

## 2023-10-17 MED ORDER — FENTANYL CITRATE (PF) 250 MCG/5ML IJ SOLN
INTRAMUSCULAR | Status: DC | PRN
  Administered 2023-10-17: 25 ug via INTRAVENOUS
  Administered 2023-10-17: 50 ug via INTRAVENOUS
  Administered 2023-10-17: 25 ug via INTRAVENOUS

## 2023-10-17 MED ORDER — LIDOCAINE 2% (20 MG/ML) 5 ML SYRINGE
INTRAMUSCULAR | Status: DC | PRN
  Administered 2023-10-17: 40 mg via INTRAVENOUS

## 2023-10-17 MED ORDER — EPINEPHRINE 1 MG/10ML IJ SOSY
PREFILLED_SYRINGE | INTRAMUSCULAR | Status: DC | PRN
  Administered 2023-10-17: 2 mL

## 2023-10-17 MED ORDER — EPINEPHRINE 0.3 MG/0.3ML IJ SOAJ
0.3000 mg | Freq: Once | INTRAMUSCULAR | Status: AC
  Administered 2023-10-17: 0.3 mg via INTRAMUSCULAR
  Filled 2023-10-17: qty 0.3

## 2023-10-17 MED ORDER — PROPOFOL 10 MG/ML IV BOLUS
INTRAVENOUS | Status: DC | PRN
  Administered 2023-10-17: 75 ug/kg/min via INTRAVENOUS
  Administered 2023-10-17: 100 mg via INTRAVENOUS

## 2023-10-17 MED ORDER — SUGAMMADEX SODIUM 200 MG/2ML IV SOLN
INTRAVENOUS | Status: DC | PRN
  Administered 2023-10-17: 200 mg via INTRAVENOUS

## 2023-10-17 MED ORDER — TRANEXAMIC ACID-NACL 1000-0.7 MG/100ML-% IV SOLN
1000.0000 mg | Freq: Once | INTRAVENOUS | Status: AC
  Administered 2023-10-17: 1000 mg via INTRAVENOUS
  Filled 2023-10-17: qty 100

## 2023-10-17 MED ORDER — TRANEXAMIC ACID FOR EPISTAXIS
1000.0000 mg | Freq: Once | TOPICAL | Status: DC
  Filled 2023-10-17: qty 10

## 2023-10-17 MED ORDER — FENTANYL CITRATE (PF) 100 MCG/2ML IJ SOLN
INTRAMUSCULAR | Status: AC
  Filled 2023-10-17: qty 2

## 2023-10-17 MED ORDER — SODIUM CHLORIDE 0.9 % IV SOLN: INTRAVENOUS | Status: DC | PRN

## 2023-10-17 MED ORDER — ORAL CARE MOUTH RINSE: 15.0000 mL | OROMUCOSAL | Status: DC | PRN

## 2023-10-17 NOTE — Anesthesia Preprocedure Evaluation (Addendum)
Anesthesia Evaluation  Patient identified by MRN, date of birth, ID band Patient awake    Reviewed: Allergy & Precautions, H&P , NPO status , Patient's Chart, lab work & pertinent test results  Airway Mallampati: II  TM Distance: >3 FB Neck ROM: Full    Dental no notable dental hx. (+) Poor Dentition, Dental Advisory Given   Pulmonary Patient abstained from smoking., former smoker   Pulmonary exam normal breath sounds clear to auscultation       Cardiovascular hypertension, Pt. on medications  Rhythm:Regular Rate:Normal     Neuro/Psych  Headaches   Depression       GI/Hepatic negative GI ROS, Neg liver ROS,,,  Endo/Other  diabetes, Type 2, Oral Hypoglycemic Agents    Renal/GU negative Renal ROS  negative genitourinary   Musculoskeletal  (+) Arthritis , Osteoarthritis,    Abdominal   Peds  Hematology negative hematology ROS (+)   Anesthesia Other Findings   Reproductive/Obstetrics negative OB ROS                             Anesthesia Physical Anesthesia Plan  ASA: 2  Anesthesia Plan: General   Post-op Pain Management: Tylenol PO (pre-op)* and Minimal or no pain anticipated   Induction: Intravenous  PONV Risk Score and Plan: 3 and Ondansetron and Dexamethasone  Airway Management Planned: Oral ETT  Additional Equipment:   Intra-op Plan:   Post-operative Plan: Extubation in OR  Informed Consent: I have reviewed the patients History and Physical, chart, labs and discussed the procedure including the risks, benefits and alternatives for the proposed anesthesia with the patient or authorized representative who has indicated his/her understanding and acceptance.     Dental advisory given  Plan Discussed with: CRNA  Anesthesia Plan Comments:        Anesthesia Quick Evaluation

## 2023-10-17 NOTE — Discharge Instructions (Signed)
Flexible Bronchoscopy, Care After This sheet gives you information about how to care for yourself after your test. Your doctor may also give you more specific instructions. If you have problems or questions, contact your doctor. Follow these instructions at home: Eating and drinking When your numbness is gone and your cough and gag reflexes have come back, you may: Eat only soft foods. Slowly drink liquids. The day after the test, go back to your normal diet. Driving Do not drive for 24 hours if you were given a medicine to help you relax (sedative). Do not drive or use heavy machinery while taking prescription pain medicine. General instructions  Take over-the-counter and prescription medicines only as told by your doctor. Return to your normal activities as told. Ask what activities are safe for you. Do not use any products that have nicotine or tobacco in them. This includes cigarettes and e-cigarettes. If you need help quitting, ask your doctor. Keep all follow-up visits as told by your doctor. This is important. It is very important if you had a tissue sample (biopsy) taken. Get help right away if: You have shortness of breath that gets worse. You get light-headed. You feel like you are going to pass out (faint). You have chest pain. You cough up: More than a little blood. More blood than before. Summary Do not eat or drink anything (not even water) for 2 hours after your test, or until your numbing medicine wears off. Do not use cigarettes. Do not use e-cigarettes. Get help right away if you have chest pain.  Please call our office for any questions or concerns.  [phone].  This information is not intended to replace advice given to you by your health care provider. Make sure you discuss any questions you have with your health care provider. Document Released: 10/10/2009 Document Revised: 11/25/2017 Document Reviewed: 12/31/2016 Elsevier Patient Education  2020 [employer]  

## 2023-10-17 NOTE — ED Triage Notes (Signed)
Pt BIB POV d/t oral swelling - pt had lung biopsy today - pt began having Oral swelling 1 hour ago.  Pt drooling and having trouble breathing.

## 2023-10-17 NOTE — Progress Notes (Signed)
eLink Physician-Brief Progress Note Patient Name: Lindsey Williamson DOB: [birth year] MRN: [id number]   Date of Service  10/17/2023  HPI/Events of Note  72 year old with a history of non-small cell lung cancer to the right lower lobe initially presented for robotic assisted bronchoscopy and developed oral swelling and angioedema.  Presents with mild tachypnea, hypertension saturating 98% on room air.  Results consistent with hyperglycemia and mild normocytic anemia.  Radiograph with no obvious pneumothorax.  Has a mild toothache with previously loose teeth, was scheduled to see a dentist for removal but given her elevated A1c, intervention was deferred.  eICU Interventions  Status post epi injection, Decadron, famotidine and TXA. Ground team evaluation pending  Add liquid Tylenol p.o. as needed  Complement testing pending.  Add sliding scale insulin  Low risk, DVT prophylaxis held, SCDs GI prophylaxis not indicated   0040 -resume home amlodipine.  Add as needed hydralazine.  0411-CBG checked at random this morning.  Result 246.  Will add one-time aspart.  Maintain AC/at bedtime checks as ordered going forward.  Intervention Category Evaluation Type: New Patient Evaluation  Akira Adelsberger 10/17/2023, 10:24 PM

## 2023-10-17 NOTE — ED Provider Notes (Signed)
[place] [facility]GENCY DEPARTMENT AT [facility] Provider Note  CSN: [id number] Arrival date & time: 10/17/23 8295  Chief Complaint(s) Oral Swelling  HPI Lindsey Williamson is a 72 y.o. female with PMH T2DM, HTN on lisinopril, stage Ia non-small cell lung cancer who presents emergency room for evaluation of oral swelling.  Patient had a biopsy performed this afternoon with Dr. Delton Coombes and went home, took her lisinopril and had sudden onset swelling of her tongue.  She states it gradually worsened over the course of 4 hours and now she is having difficulty talking.  She arrives with some mild difficulty swallowing her secretions but is not hypoxic, not tripoding.  Denies abdominal pain, nausea, vomiting, chest pain, shortness of breath, headache, fever or other systemic symptoms.   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 Authorizing Provider  amLODipine (NORVASC) 5 MG tablet Take 5 mg by mouth daily. 02/08/22   [provider]  aspirin EC 81 MG tablet Take 81 mg by mouth daily. Swallow whole.    [provider]  azelastine (ASTELIN) 0.1 % nasal spray Place 2 sprays into both nostrils 2 (two) times daily as needed for rhinitis. 02/22/23   Alfonse Spruce, MD  cetirizine (ZYRTEC ALLERGY) 10 MG tablet Take 1 tablet (10 mg total) by mouth daily. 02/22/23   Alfonse Spruce, MD  diclofenac Sodium (VOLTAREN) 1 % GEL Apply 1 application. topically 2 (two) times daily. 02/08/22   [provider]  famotidine (PEPCID) 20 MG tablet Take 1 tablet (20 mg total) by mouth 2 (two) times daily. 02/22/23   Alfonse Spruce, MD  fluticasone [facility]) 50 MCG/ACT nasal spray Place 2 sprays into both nostrils daily. 02/22/23   Alfonse Spruce, MD  JARDIANCE 10 MG TABS tablet Take 10 mg by mouth daily. 02/08/22   [provider]  metFORMIN (GLUCOPHAGE-XR) 500 MG 24 hr tablet Take 1,000 mg by mouth 2 (two) times daily. 02/08/22   [provider]  montelukast (SINGULAIR) 10 MG tablet Take 1 tablet (10 mg total) by mouth at bedtime. 02/22/23   Alfonse Spruce, MD  pravastatin (PRAVACHOL) 40 MG tablet Take 40 mg by mouth daily.    [provider]  Past Surgical History Past Surgical History:  Procedure Laterality Date   BRONCHIAL BIOPSY  03/30/2022   Procedure: BRONCHIAL BIOPSIES;  Surgeon: Josephine Igo, DO;  Location: [facility];  Service: Pulmonary;;   BRONCHIAL BRUSHINGS  03/30/2022   Procedure: BRONCHIAL BRUSHINGS;  Surgeon: Josephine Igo, DO;  Location: [facility];  Service: Pulmonary;;   BRONCHIAL NEEDLE ASPIRATION BIOPSY   03/30/2022   Procedure: BRONCHIAL NEEDLE ASPIRATION BIOPSIES;  Surgeon: Josephine Igo, DO;  Location: [facility];  Service: Pulmonary;;   EYE SURGERY     FIDUCIAL MARKER PLACEMENT  03/30/2022   Procedure: FIDUCIAL MARKER PLACEMENT;  Surgeon: Josephine Igo, DO;  Location: [facility];  Service: Pulmonary;;   FOREIGN BODY REMOVAL  03/30/2022   Procedure: FOREIGN BODY REMOVAL;  Surgeon: Josephine Igo, DO;  Location: [facility];  Service: Pulmonary;;   VIDEO BRONCHOSCOPY WITH ENDOBRONCHIAL ULTRASOUND Bilateral 03/30/2022   Procedure: VIDEO BRONCHOSCOPY WITH ENDOBRONCHIAL ULTRASOUND;  Surgeon: Josephine Igo, DO;  Location: [facility];  Service: Pulmonary;  Laterality: Bilateral;   VIDEO BRONCHOSCOPY WITH RADIAL ENDOBRONCHIAL ULTRASOUND  03/30/2022   Procedure: VIDEO BRONCHOSCOPY WITH RADIAL ENDOBRONCHIAL ULTRASOUND;  Surgeon: Josephine Igo, DO;  Location: [facility];  Service: Pulmonary;;   Family History Family History  Problem Relation Age of Onset   Allergic rhinitis Sister    Asthma Sister    Angioedema Neg Hx    Atopy Neg Hx    Immunodeficiency Neg Hx    Urticaria Neg Hx    Eczema Neg Hx     Social History Social History   Tobacco Use   Smoking status: Former    Current packs/day: 0.00    Types: Cigarettes    Quit date: 03/26/2022    Years since quitting: 1.5    Passive exposure: Never   Smokeless tobacco: Former  Building services engineer status: Never Used  Substance Use Topics   Alcohol use: No   Drug use: Never   Allergies Benzonatate, Aspirin, Lisinopril, Azithromycin, and Codeine  Review of Systems Review of Systems  HENT:  Positive for facial swelling, trouble swallowing and voice change.     Physical Exam Vital Signs  I have reviewed the triage vital signs BP (!) 144/78   Pulse 90   Temp (!) 97.1 F (36.2 C) (Oral)   Resp (!) 25   Ht 5\' 4"  (1.626 m)   Wt 70.4 kg   SpO2 99%   BMI 26.64 kg/m   Physical Exam Vitals and nursing note reviewed.   Constitutional:      General: She is not in acute distress.    Appearance: She is well-developed.  HENT:     Head: Normocephalic and atraumatic.     Comments: Significant oral angioedema with asymmetric tongue swelling, right greater than left Eyes:     Conjunctiva/sclera: Conjunctivae normal.  Cardiovascular:     Rate and Rhythm: Normal rate and regular rhythm.     Heart sounds: No murmur heard. Pulmonary:     Effort: Pulmonary effort is normal. No respiratory distress.     Breath sounds: Normal breath sounds.  Abdominal:     Palpations: Abdomen is soft.     Tenderness: There is no abdominal tenderness.  Musculoskeletal:        General: No swelling.     Cervical back: Neck supple.  Skin:    General: Skin is warm and dry.     Capillary Refill: Capillary refill takes less than 2 seconds.  Neurological:  Mental Status: She is alert.  Psychiatric:        Mood and Affect: Mood normal.     ED Results and Treatments Labs (all labs ordered are listed, but only abnormal results are displayed) Labs Reviewed  COMPREHENSIVE METABOLIC PANEL - Abnormal; Notable for the following components:      Result Value   Sodium 134 (*)    Glucose, Bld 249 (*)    Total Protein 8.3 (*)    Albumin 3.0 (*)    AST 10 (*)    All other components within normal limits  CBC WITH DIFFERENTIAL/PLATELET - Abnormal; Notable for the following components:   Hemoglobin 11.9 (*)    Platelets 519 (*)    All other components within normal limits  CBG MONITORING, ED - Abnormal; Notable for the following components:   Glucose-Capillary 246 (*)    All other components within normal limits  C4 COMPLEMENT  C1 ESTERASE INHIBITOR  COMPLEMENT COMPONENT C1Q  HEMOGLOBIN A1C                                                                                                                          Radiology DG Chest Port 1 View  Result Date: 10/17/2023 CLINICAL DATA:  Post bronchoscopy with biopsy EXAM:  PORTABLE CHEST 1 VIEW COMPARISON:  CT 09/23/2023 FINDINGS: Diffuse reticular opacity, corresponding to emphysema and chronic lung disease. Opacity at the right base corresponding to area of concern on prior CT imaging, fiducial marker in the region. No pleural effusions. No convincing pneumothorax. IMPRESSION: 1. No convincing pneumothorax. 2. Emphysema and chronic lung disease. Right base opacity corresponding to radiation change and masslike consolidation on prior CT imaging. Electronically Signed   By: Jasmine Pang M.D.   On: 10/17/2023 18:46   DG C-ARM BRONCHOSCOPY  Result Date: 10/17/2023 C-ARM BRONCHOSCOPY: Fluoroscopy was utilized by the requesting physician.  No radiographic interpretation.    Pertinent labs & imaging results that were available during my care of the patient were reviewed by me and considered in my medical decision making (see MDM for details).  Medications Ordered in ED Medications  famotidine (PEPCID) IVPB 20 mg premix (0 mg Intravenous Stopped 10/17/23 2051)  EPINEPHrine (EPI-PEN) injection 0.3 mg (0.3 mg Intramuscular Given 10/17/23 2017)  dexamethasone (DECADRON) injection 10 mg (10 mg Intravenous Given 10/17/23 2018)  diphenhydrAMINE (BENADRYL) injection 25 mg (25 mg Intravenous Given 10/17/23 2018)  tranexamic acid (CYKLOKAPRON) IVPB 1,000 mg (1,000 mg Intravenous New Bag/Given 10/17/23 2140)  Procedures .Critical Care  Performed by: Glendora Score, MD Authorized by: Glendora Score, MD   Critical care provider statement:    Critical care time (minutes):  30   Critical care was necessary to treat or prevent imminent or life-threatening deterioration of the following conditions:  Respiratory failure   Critical care was time spent personally by me on the following activities:  Development of treatment plan with patient or  surrogate, discussions with consultants, evaluation of patient's response to treatment, examination of patient, ordering and review of laboratory studies, ordering and review of radiographic studies, ordering and performing treatments and interventions, pulse oximetry, re-evaluation of patient's condition and review of old charts   (including critical care time)  Medical Decision Making / ED Course   This patient presents to the ED for concern of oral swelling, this involves an extensive number of treatment options, and is a complaint that carries with it a high risk of complications and morbidity.  The differential diagnosis includes angioedema, anaphylaxis, local irritation, RPA, PTA  MDM: Patient seen emergency room for evaluation of oral swelling.  Physical exam with significant swelling of the tongue, asymmetric right greater than left, muffled voice.  Patient has full range of motion of the neck.  Laboratory evaluation largely unremarkable.  Initial presentation highly concerning for ACE inhibitor induced angioedema but we did start with anaphylaxis based treatment with Decadron, epinephrine and Benadryl.  On my reevaluation she is improving and we will hold off on prophylactic intubation given symptomatic improvement.  I spoke with Dr. Gaynell Face of pulmonology who accepts the patient for ICU admission at [facility].  Would benefit from being at [facility] in case anesthesia needs to get involved for fiberoptic intubation.  [facility] does have Berinert but with significant cost to this medication and symptomatic improvement with medications given in the ER, we will trial TXA first.  If she were to worsen, this medication would be available.  I also spoke with Dr. Freida Busman of anesthesiology and informed her of the plan of the patient going to [facility] to ensure that all possible parties that might get involved in her care are aware.  Patient then transferred emergently to [facility], ICU.   Additional  history obtained: -Additional history obtained from husband -External records from outside source obtained and reviewed including: Chart review including previous notes, labs, imaging, consultation notes   Lab Tests: -I ordered, reviewed, and interpreted labs.   The pertinent results include:   Labs Reviewed  COMPREHENSIVE METABOLIC PANEL - Abnormal; Notable for the following components:      Result Value   Sodium 134 (*)    Glucose, Bld 249 (*)    Total Protein 8.3 (*)    Albumin 3.0 (*)    AST 10 (*)    All other components within normal limits  CBC WITH DIFFERENTIAL/PLATELET - Abnormal; Notable for the following components:   Hemoglobin 11.9 (*)    Platelets 519 (*)    All other components within normal limits  CBG MONITORING, ED - Abnormal; Notable for the following components:   Glucose-Capillary 246 (*)    All other components within normal limits  C4 COMPLEMENT  C1 ESTERASE INHIBITOR  COMPLEMENT COMPONENT C1Q  HEMOGLOBIN A1C      Medicines ordered and prescription drug management: Meds ordered this encounter  Medications   DISCONTD: methylPREDNISolone sodium succinate (SOLU-MEDROL) 125 mg/2 mL injection 125 mg   DISCONTD: diphenhydrAMINE (BENADRYL) injection 25 mg   famotidine (PEPCID) IVPB 20 mg premix  EPINEPHrine (EPI-PEN) injection 0.3 mg   dexamethasone (DECADRON) injection 10 mg   diphenhydrAMINE (BENADRYL) injection 25 mg   DISCONTD: tranexamic acid (CYKLOKAPRON) 1000 MG/10ML topical solution 1,000 mg   tranexamic acid (CYKLOKAPRON) IVPB 1,000 mg    -I have reviewed the patients home medicines and have made adjustments as needed  Critical interventions Epinephrine, Decadron, TXA, diphenhydramine  Consultations Obtained: I requested consultation with the ICU physician Dr. Gaynell Face,  and discussed lab and imaging findings as well as pertinent plan - they recommend: [facility] ICU admission   Cardiac Monitoring: The patient was maintained on a cardiac  monitor.  I personally viewed and interpreted the cardiac monitored which showed an underlying rhythm of: NSR  Social Determinants of Health:  Factors impacting patients care include: none   Reevaluation: After the interventions noted above, I reevaluated the patient and found that they have :improved  Co morbidities that complicate the patient evaluation  Past Medical History:  Diagnosis Date   Angioedema 10/17/2023   Arthritis    Diabetes mellitus without complication (HCC)    Hypertension       Dispostion: I considered admission for this patient, and given ACE inhibitor induced angioedema patient require hospital admission     Final Clinical Impression(s) / ED Diagnoses Final diagnoses:  None     @PCDICTATION @    Glendora Score, MD 10/17/23 2201

## 2023-10-17 NOTE — Anesthesia Procedure Notes (Signed)
Procedure Name: Intubation Date/Time: 10/17/2023 1:06 PM  Performed by: April Holding, CRNAPre-anesthesia Checklist: Patient identified, Emergency Drugs available, Suction available and Patient being monitored Patient Re-evaluated:Patient Re-evaluated prior to induction Oxygen Delivery Method: Circle System Utilized Preoxygenation: Pre-oxygenation with 100% oxygen Induction Type: IV induction Ventilation: Mask ventilation without difficulty Laryngoscope Size: Miller and 2 Grade View: Grade II Tube type: Oral Tube size: 8.5 mm Number of attempts: 1 Airway Equipment and Method: Stylet and Oral airway Placement Confirmation: ETT inserted through vocal cords under direct vision, positive ETCO2 and breath sounds checked- equal and bilateral Secured at: 22 cm Tube secured with: Tape Dental Injury: Teeth and Oropharynx as per pre-operative assessment

## 2023-10-17 NOTE — ED Notes (Signed)
ED TO INPATIENT HANDOFF REPORT  Name/Age/Gender Lindsey Williamson 72 y.o. female  Code Status   Home/SNF/Other Home  Chief Complaint Angioedema [T78.3XXA]  Level of Care/Admitting Diagnosis ED Disposition     ED Disposition  Admit   Condition  --   Comment  Hospital Area: [facility] [[id number]]  Level of Care: ICU [6]  May admit patient to [facility] or [facility] if equivalent level of care is available:: No  Covid Evaluation: Asymptomatic - no recent exposure (last 10 days) testing not required  Diagnosis: Angioedema [190627]  Admitting Physician: Briant Sites [[id number]]  Attending Physician: Briant Sites [[id number]]  Certification:: I certify this patient will need inpatient services for at least 2 midnights  Expected Medical Readiness: 10/19/2023          Medical History Past Medical History:  Diagnosis Date   Angioedema 10/17/2023   Arthritis    Diabetes mellitus without complication (HCC)    Hypertension     Allergies Allergies  Allergen Reactions   Benzonatate Palpitations   Aspirin Palpitations   Azithromycin Palpitations   Codeine Palpitations    IV Location/Drains/Wounds Patient Lines/Drains/Airways Status     Active Line/Drains/Airways     Name Placement date Placement time Site Days   Peripheral IV 10/17/23 20 G 1" Left Antecubital 10/17/23  2011  Antecubital  less than 1            Labs/Imaging Results for orders placed or performed during the hospital encounter of 10/17/23 (from the past 48 hour(s))  Comprehensive metabolic panel     Status: Abnormal   Collection Time: 10/17/23  8:19 PM  Result Value Ref Range   Sodium 134 (L) 135 - 145 mmol/L   Potassium 4.2 3.5 - 5.1 mmol/L   Chloride 100 98 - 111 mmol/L   CO2 22 22 - 32 mmol/L   Glucose, Bld 249 (H) 70 - 99 mg/dL    Comment: Glucose reference range applies only to samples taken after fasting for at least 8 hours.   BUN 16 8 - 23 mg/dL    Creatinine, Ser 1.91 0.44 - 1.00 mg/dL   Calcium 8.9 8.9 - 47.8 mg/dL   Total Protein 8.3 (H) 6.5 - 8.1 g/dL   Albumin 3.0 (L) 3.5 - 5.0 g/dL   AST 10 (L) 15 - 41 U/L   ALT 8 0 - 44 U/L   Alkaline Phosphatase 55 38 - 126 U/L   Total Bilirubin 0.6 0.3 - 1.2 mg/dL   GFR, Estimated >29 >56 mL/min    Comment: (NOTE) Calculated using the CKD-EPI Creatinine Equation (2021)    Anion gap 12 5 - 15    Comment: Performed at [facility], [address], [place], [place] [address]  CBC with Differential     Status: Abnormal   Collection Time: 10/17/23  8:19 PM  Result Value Ref Range   WBC 7.9 4.0 - 10.5 K/uL   RBC 4.53 3.87 - 5.11 MIL/uL   Hemoglobin 11.9 (L) 12.0 - 15.0 g/dL   HCT 65.7 84.6 - 96.2 %   MCV 85.7 80.0 - 100.0 fL   MCH 26.3 26.0 - 34.0 pg   MCHC 30.7 30.0 - 36.0 g/dL   RDW 95.2 84.1 - 32.4 %   Platelets 519 (H) 150 - 400 K/uL   nRBC 0.0 0.0 - 0.2 %   Neutrophils Relative % 87 %   Neutro Abs 6.9 1.7 - 7.7 K/uL   Lymphocytes Relative 11 %  Lymphs Abs 0.9 0.7 - 4.0 K/uL   Monocytes Relative 2 %   Monocytes Absolute 0.2 0.1 - 1.0 K/uL   Eosinophils Relative 0 %   Eosinophils Absolute 0.0 0.0 - 0.5 K/uL   Basophils Relative 0 %   Basophils Absolute 0.0 0.0 - 0.1 K/uL   Immature Granulocytes 0 %   Abs Immature Granulocytes 0.03 0.00 - 0.07 K/uL    Comment: Performed at [facility], [address] [place], [place] [address]  CBG monitoring, ED     Status: Abnormal   Collection Time: 10/17/23  8:28 PM  Result Value Ref Range   Glucose-Capillary 246 (H) 70 - 99 mg/dL    Comment: Glucose reference range applies only to samples taken after fasting for at least 8 hours.   DG Chest Port 1 View  Result Date: 10/17/2023 CLINICAL DATA:  Post bronchoscopy with biopsy EXAM: PORTABLE CHEST 1 VIEW COMPARISON:  CT 09/23/2023 FINDINGS: Diffuse reticular opacity, corresponding to emphysema and chronic lung disease. Opacity at the right base  corresponding to area of concern on prior CT imaging, fiducial marker in the region. No pleural effusions. No convincing pneumothorax. IMPRESSION: 1. No convincing pneumothorax. 2. Emphysema and chronic lung disease. Right base opacity corresponding to radiation change and masslike consolidation on prior CT imaging. Electronically Signed   By: Jasmine Pang M.D.   On: 10/17/2023 18:46   DG C-ARM BRONCHOSCOPY  Result Date: 10/17/2023 C-ARM BRONCHOSCOPY: Fluoroscopy was utilized by the requesting physician.  No radiographic interpretation.    Pending Labs Unresulted Labs (From admission, onward)    None       Vitals/Pain Today's Vitals   10/17/23 1939 10/17/23 2010 10/17/23 2012 10/17/23 2013  BP: (!) 158/76  101/72   Pulse: 86  83   Resp: 18  18   Temp:   (!) 97.1 F (36.2 C)   TempSrc:   Oral   SpO2: 98% 98% 98%   Weight:    70.4 kg  Height:    5\' 4"  (1.626 m)  PainSc:   0-No pain     Isolation Precautions No active isolations  Medications Medications  tranexamic acid (CYKLOKAPRON) 1000 MG/10ML topical solution 1,000 mg (has no administration in time range)  famotidine (PEPCID) IVPB 20 mg premix (0 mg Intravenous Stopped 10/17/23 2051)  EPINEPHrine (EPI-PEN) injection 0.3 mg (0.3 mg Intramuscular Given 10/17/23 2017)  dexamethasone (DECADRON) injection 10 mg (10 mg Intravenous Given 10/17/23 2018)  diphenhydrAMINE (BENADRYL) injection 25 mg (25 mg Intravenous Given 10/17/23 2018)    Mobility walks

## 2023-10-17 NOTE — Op Note (Signed)
Video Bronchoscopy with Robotic Assisted Bronchoscopic Navigation and Endobronchial Ultrasound Procedure Note  Date of Operation: 10/17/2023   Pre-op Diagnosis: Right lower lobe mass, right hilar adenopathy  Post-op Diagnosis: Same  Surgeon: Levy Pupa  Assistants: None  Anesthesia: General endotracheal anesthesia  Operation: Flexible video fiberoptic bronchoscopy with robotic assistance and biopsies.  Estimated Blood Loss: Minimal  Complications: None  Indications and History: MELDA Lindsey Williamson is a 72 y.o. female with history of stage I adenocarcinoma of the lung was treated with SBRT.  Surveillance imaging showed evidence for possible recurrence with a right lower lobe opacity and right hilar adenopathy.  Recommendation made to achieve a tissue diagnosis via robotic assisted navigational bronchoscopy and endobronchial ultrasound with biopsies. The risks, benefits, complications, treatment options and expected outcomes were discussed with the patient.  The possibilities of pneumothorax, pneumonia, reaction to medication, pulmonary aspiration, perforation of a viscus, bleeding, failure to diagnose a condition and creating a complication requiring transfusion or operation were discussed with the patient who freely signed the consent.    Description of Procedure: The patient was seen in the Preoperative Area, was examined and was deemed appropriate to proceed.  The patient was taken to [facility] endoscopy room 3, identified as [name] and the procedure verified as Flexible Video Fiberoptic Bronchoscopy.  A Time Out was held and the above information confirmed.   Prior to the date of the procedure a high-resolution CT scan of the chest was performed. Utilizing ION software program a virtual tracheobronchial tree was generated to allow the creation of distinct navigation pathways to the patient's parenchymal abnormalities. After being taken to the operating room general anesthesia was  initiated and the patient  was orally intubated. The video fiberoptic bronchoscope was introduced via the endotracheal tube and a general inspection was performed which showed normal left lung anatomy.the main carina was thickened and splayed as well as the right upper lobe carina.  There was narrowing and evidence for erythema and an evolving endobronchial lesion in the mid to distal bronchus intermedius extending down to the right lower lobe orifice where there was a more distinct raised endobronchial lesion.  Endobronchial brushings and endobronchial forceps biopsies were performed on this lesion for cytology.  Aspiration of the bilateral mainstems was completed to remove any remaining secretions. Robotic catheter inserted into patient's endotracheal tube.   Target #1 right lower lobe opacity /mass: The distinct navigation pathways prepared prior to this procedure were then utilized to navigate to patient's lesion identified on CT scan. The robotic catheter was secured into place and the vision probe was withdrawn.  Lesion location was approximated using fluoroscopy.  Local registration and targeting was performed using CIOs three-dimensional imaging.  Under fluoroscopic guidance transbronchial needle brushings, transbronchial needle biopsies, and transbronchial forceps biopsies were performed to be sent for cytology and pathology.  There was an adjacent fiducial marker already present.  No new markers were placed.  The robotic scope was then withdrawn and the endobronchial ultrasound was used to identify and characterize the peritracheal, hilar and bronchial lymph nodes. Inspection showed enlargement at station 4L, 7, 4R, 10R, 11R. Using real-time ultrasound guidance Wang needle biopsies were take from Station 4L, 7, 11R nodes and were sent for cytology.   At the end of the procedure a general airway inspection was performed and there was no evidence of active bleeding. The bronchoscope was removed.  The  patient tolerated the procedure well. There was no significant blood loss and there were no obvious complications.  A post-procedural chest x-ray is pending.  Samples Target #1: 1. Transbronchial needle brushings from right lower lobe opacity 2. Transbronchial Wang needle biopsies from right lower lobe opacity 3. Transbronchial forceps biopsies from right lower lobe opacity  EBUS samples: 1. Wang needle biopsies from 4L node 2. Wang needle biopsies from 7 node 3. Wang needle biopsies from 11R node   Plans:  The patient will be discharged from the PACU to home when recovered from anesthesia and after chest x-ray is reviewed. We will review the cytology, pathology and microbiology results with the patient when they become available. Outpatient followup will be with A. Bruning and S. Groce.   Levy Pupa, MD, PhD 10/17/2023, 2:30 PM [facility] [phone] or if no answer before 7:00PM call [phone] For any issues after 7:00PM please call eLink [phone]

## 2023-10-17 NOTE — Telephone Encounter (Signed)
Underwent Navigational Bronchoscopy today with Dr. Delton Coombes   After getting home from procedure started to notice tongue was swelling , feeling thick . Speech affected. No lip swelling or difficulty swallowing. No previous hx of trouble with anesthesia.   No cough or wheezing or hemoptysis.  Advised to go to ER immediately. If develops trouble breathing will need to call 911.  Patient and husband said they will go directly to ER at [facility].   Please contact office for sooner follow up if symptoms do not improve or worsen or seek emergency care

## 2023-10-17 NOTE — H&P (Signed)
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 Pt reports procedure went well but when she arrived home and took her home medications as normal but then developed tachypnea, oral swelling. Upon further review it was noted that lisinopril is on her home med list. Improvement was noted upon giving pt h2 blocker, diphenhydramine and steroids. Recommended txa as well. Ordered complement levels and transferred to [facility] for close monitoring of airway.   [facility] endorses improvement in her symptoms. Denies sob at this time, is having some difficulty with secretions but able to cough it up and then suction. She is able to speak at this time which she states was previously a challenge.   Will cont to follow closely. Pt does state she has an MRI on Wednesday afternoon with her usual oncology team with [facility] and would like to be discharged by then. Pt offers no other complaints.   Pertinent  Medical History  H/o lung Ca Htn ?dm2 with hyperglycemia   Significant Hospital Events: Including procedures, antibiotic start and stop dates in addition to other pertinent events   S/p bronch with bx 10/24 am Re presented 10/24 with swelling: concern for angioedema, admitted to ICU for airway watch  Interim History / Subjective:   Objective   Blood pressure 101/72, pulse 83, temperature (!) 97.1 F (36.2 C), temperature source Oral, resp. rate 18, height 5\' 4"  (1.626 m), weight 70.4 kg, SpO2 98%.        Intake/Output Summary (Last 24 hours) at 10/17/2023 2104 Last data filed at 10/17/2023 2051 Gross per 24 hour  Intake 50 ml  Output --  Net 50 ml   Filed Weights   10/17/23 2013  Weight: 70.4 kg     Examination: General: awake alert conversant, sitting up in bed in nad HENT: ncat eomi, perrla, mmm and pink with some tongue swelling and R cheek swelling Lungs: ctab Cardiovascular: rrr Abdomen: soft nt nd bs+ Extremities: no cce Neuro: no focal deficits, clear speech GU: deferred  Resolved Hospital Problem list     Assessment & Plan:  Angioedema:  -suspected 2/2 lisinopril -airway monitoring -transfer to [facility] for closer observation -cont with h2 blockers, benadryl, steroids -check complement factors - tranexamic acid being given  Copd with known emphysema:  RLL mass  R hilar adenopathy -s/p recent bronchoscopy 2/2 concern for reoccurrence of  lung Ca -f/u pathology  Hyponatremia Hyperglycemia -ssi -check A1c  Normocytic anemia:  thrombocytosis -no acute indication for transfusion -follow trend  htn  Best Practice (right click and "Reselect all SmartList Selections" daily)   Diet/type: NPO DVT prophylaxis: SCD GI prophylaxis: N/A Lines: N/A Foley:  N/A Code Status:  full code Last date of multidisciplinary goals of care discussion [pending]  Labs   CBC: Recent Labs  Lab 10/17/23 1059 10/17/23 2019  WBC 7.6 7.9  NEUTROABS  --  6.9  HGB 11.5* 11.9*  HCT 37.2 38.8  MCV 84.7 85.7  PLT 476* 519*    Basic Metabolic Panel: Recent Labs  Lab 10/17/23 1059 10/17/23 2019  NA 135 134*  K 3.5 4.2  CL 103 100  CO2 23 22  GLUCOSE 180* 249*  BUN 11 16  CREATININE 0.73 0.66  CALCIUM 8.9 8.9   GFR: Estimated Creatinine Clearance: 60.3 mL/min (by C-G formula based on SCr of 0.66 mg/dL). Recent Labs  Lab 10/17/23 1059 10/17/23 2019  WBC 7.6 7.9    Liver Function Tests: Recent Labs  Lab 10/17/23 2019  AST 10*  ALT 8  ALKPHOS 55  BILITOT 0.6  PROT 8.3*  ALBUMIN 3.0*   No results for input(s): "LIPASE", "AMYLASE" in the last 168 hours. No results for input(s): "AMMONIA" in the last 168 hours.  ABG No results found for: "PHART",  "PCO2ART", "PO2ART", "HCO3", "TCO2", "ACIDBASEDEF", "O2SAT"   Coagulation Profile: No results for input(s): "INR", "PROTIME" in the last 168 hours.  Cardiac Enzymes: No results for input(s): "CKTOTAL", "CKMB", "CKMBINDEX", "TROPONINI" in the last 168 hours.  HbA1C: No results found for: "HGBA1C"  CBG: Recent Labs  Lab 10/17/23 1107 10/17/23 1256 10/17/23 2028  GLUCAP 173* 144* 246*    Review of Systems:   As per HPI  Past Medical History:  She,  has a past medical history of Angioedema (10/17/2023), Arthritis, Diabetes mellitus without complication (HCC), and Hypertension.   Surgical History:   Past Surgical History:  Procedure Laterality Date   BRONCHIAL BIOPSY  03/30/2022   Procedure: BRONCHIAL BIOPSIES;  Surgeon: Josephine Igo, DO;  Location: [facility];  Service: Pulmonary;;   BRONCHIAL BRUSHINGS  03/30/2022   Procedure: BRONCHIAL BRUSHINGS;  Surgeon: Josephine Igo, DO;  Location: [facility];  Service: Pulmonary;;   BRONCHIAL NEEDLE ASPIRATION BIOPSY  03/30/2022   Procedure: BRONCHIAL NEEDLE ASPIRATION BIOPSIES;  Surgeon: Josephine Igo, DO;  Location: [facility];  Service: Pulmonary;;   EYE SURGERY     FIDUCIAL MARKER PLACEMENT  03/30/2022   Procedure: FIDUCIAL MARKER PLACEMENT;  Surgeon: Josephine Igo, DO;  Location: [facility];  Service: Pulmonary;;   FOREIGN BODY REMOVAL  03/30/2022   Procedure: FOREIGN BODY REMOVAL;  Surgeon: Josephine Igo, DO;  Location: [facility];  Service: Pulmonary;;   VIDEO BRONCHOSCOPY WITH ENDOBRONCHIAL ULTRASOUND Bilateral 03/30/2022   Procedure: VIDEO BRONCHOSCOPY WITH ENDOBRONCHIAL ULTRASOUND;  Surgeon: Josephine Igo, DO;  Location: [facility];  Service: Pulmonary;  Laterality: Bilateral;   VIDEO BRONCHOSCOPY WITH RADIAL ENDOBRONCHIAL ULTRASOUND  03/30/2022   Procedure: VIDEO BRONCHOSCOPY WITH RADIAL ENDOBRONCHIAL ULTRASOUND;  Surgeon: Josephine Igo, DO;  Location: [facility];  Service: Pulmonary;;     Social  History:   reports that she quit smoking about 18 months ago. Her smoking use included cigarettes. She has never been exposed to tobacco smoke. She has quit using smokeless tobacco. She reports that she does not drink alcohol and does not use drugs.   Family History:  Her family history includes Allergic rhinitis in her sister; Asthma in her sister. There is no history of Angioedema, Atopy, Immunodeficiency, Urticaria, or Eczema.   Allergies Allergies  Allergen Reactions   Benzonatate Palpitations   Aspirin Palpitations   Azithromycin Palpitations   Codeine Palpitations     Home Medications  Prior to Admission medications   Medication Sig Start Date End Date Taking? Authorizing Provider  amLODipine (NORVASC) 5 MG tablet Take 5 mg by mouth daily. 02/08/22   [provider]  aspirin EC 81 MG tablet Take 81 mg by mouth daily. Swallow whole.    [provider]  azelastine (ASTELIN) 0.1 % nasal spray Place 2 sprays into both nostrils 2 (two) times daily as needed for rhinitis. 02/22/23   Alfonse Spruce, MD  cetirizine (ZYRTEC ALLERGY) 10 MG tablet Take 1 tablet (  10 mg total) by mouth daily. 02/22/23   Alfonse Spruce, MD  diclofenac Sodium (VOLTAREN) 1 % GEL Apply 1 application. topically 2 (two) times daily. 02/08/22   [provider]  famotidine (PEPCID) 20 MG tablet Take 1 tablet (20 mg total) by mouth 2 (two) times daily. 02/22/23   Alfonse Spruce, MD  fluticasone [facility]) 50 MCG/ACT nasal spray Place 2 sprays into both nostrils daily. 02/22/23   Alfonse Spruce, MD  JARDIANCE 10 MG TABS tablet Take 10 mg by mouth daily. 02/08/22   [provider]  metFORMIN (GLUCOPHAGE-XR) 500 MG 24 hr tablet Take 1,000 mg by mouth 2 (two) times daily. 02/08/22   [provider]  montelukast (SINGULAIR) 10 MG tablet Take 1 tablet (10 mg total) by mouth at bedtime. 02/22/23   Alfonse Spruce, MD  pravastatin (PRAVACHOL) 40 MG tablet Take  40 mg by mouth daily.    [provider]     Critical care time: excluding procedures.

## 2023-10-17 NOTE — ED Provider Triage Note (Signed)
Emergency Medicine Provider Triage Evaluation Note  DOLCE LOCH , a 72 y.o. female  was evaluated in triage.  Pt complains of angioedema.  Review of Systems  Positive:  Negative:   Physical Exam  BP (!) 158/76 (BP Location: Left Arm)   Pulse 86   Resp 18   SpO2 98%  Gen:   Awake, no distress   Resp:  Normal effort  MSK:   Moves extremities without difficulty  Other:    Medical Decision Making  Medically screening exam initiated at 7:54 PM.  Appropriate orders placed.  SOPHIAH PAYES was informed that the remainder of the evaluation will be completed by another provider, this initial triage assessment does not replace that evaluation, and the importance of remaining in the ED until their evaluation is complete.  Had bronchoscopy for lung biopsy today. Has had this procedure in the past. No new medications or food. Tongue started swelling around 5PM. Patient able to swallow but with difficulty. States that it does not feel like there is any swelling in throat currently. No respiratory distress. Ordering medications now.    Valrie Hart F, [place] 10/17/23 2000

## 2023-10-17 NOTE — Transfer of Care (Signed)
Immediate Anesthesia Transfer of Care Note  Patient: Lindsey Williamson  Procedure(s) Performed: ROBOTIC ASSISTED NAVIGATIONAL BRONCHOSCOPY (Right) VIDEO BRONCHOSCOPY WITH ENDOBRONCHIAL ULTRASOUND (Right) BRONCHIAL NEEDLE ASPIRATION BIOPSIES BRONCHIAL BRUSHINGS BRONCHIAL BIOPSIES HEMOSTASIS CONTROL FINE NEEDLE ASPIRATION  Patient Location: Endoscopy Unit  Anesthesia Type:General  Level of Consciousness: awake  Airway & Oxygen Therapy: Patient Spontanous Breathing  Post-op Assessment: Report given to RN and Post -op Vital signs reviewed and stable  Post vital signs: Reviewed and stable  Last Vitals:  Vitals Value Taken Time  BP    Temp    Pulse    Resp    SpO2      Last Pain:  Vitals:   10/17/23 1125  TempSrc:   PainSc: 0-No pain         Complications: No notable events documented.

## 2023-10-17 NOTE — Progress Notes (Signed)
The proposed treatment discussed in conference is for discussion purpose only and is not a binding recommendation.  The patients have not been physically examined, or presented with their treatment options.  Therefore, final treatment plans cannot be decided.  

## 2023-10-17 NOTE — H&P (Signed)
Lindsey Williamson is an 72 y.o. female.   Chief Complaint: Right lower lobe opacity, right hilar adenopathy HPI:  72 year old woman with a history of stage Ia non-small cell lung cancer of the right lower lobe that was treated with SBRT.  She has surveillance imaging that shows involving right hilar adenopathy and right lower lobe opacity concerning for recurrence and metastasis.  She presents now for tissue diagnosis.  Denies any new problems.  She is active.  She continues to smoke 1 to 2 cigarettes a day.  Past Medical History:  Diagnosis Date   Arthritis    Diabetes mellitus without complication (HCC)    Hypertension     Past Surgical History:  Procedure Laterality Date   BRONCHIAL BIOPSY  03/30/2022   Procedure: BRONCHIAL BIOPSIES;  Surgeon: Josephine Igo, DO;  Location: [facility];  Service: Pulmonary;;   BRONCHIAL BRUSHINGS  03/30/2022   Procedure: BRONCHIAL BRUSHINGS;  Surgeon: Josephine Igo, DO;  Location: [facility];  Service: Pulmonary;;   BRONCHIAL NEEDLE ASPIRATION BIOPSY  03/30/2022   Procedure: BRONCHIAL NEEDLE ASPIRATION BIOPSIES;  Surgeon: Josephine Igo, DO;  Location: [facility];  Service: Pulmonary;;   EYE SURGERY     FIDUCIAL MARKER PLACEMENT  03/30/2022   Procedure: FIDUCIAL MARKER PLACEMENT;  Surgeon: Josephine Igo, DO;  Location: [facility];  Service: Pulmonary;;   FOREIGN BODY REMOVAL  03/30/2022   Procedure: FOREIGN BODY REMOVAL;  Surgeon: Josephine Igo, DO;  Location: [facility];  Service: Pulmonary;;   VIDEO BRONCHOSCOPY WITH ENDOBRONCHIAL ULTRASOUND Bilateral 03/30/2022   Procedure: VIDEO BRONCHOSCOPY WITH ENDOBRONCHIAL ULTRASOUND;  Surgeon: Josephine Igo, DO;  Location: [facility];  Service: Pulmonary;  Laterality: Bilateral;   VIDEO BRONCHOSCOPY WITH RADIAL ENDOBRONCHIAL ULTRASOUND  03/30/2022   Procedure: VIDEO BRONCHOSCOPY WITH RADIAL ENDOBRONCHIAL ULTRASOUND;  Surgeon: Josephine Igo, DO;  Location: [facility];  Service: Pulmonary;;     Family History  Problem Relation Age of Onset   Allergic rhinitis Sister    Asthma Sister    Angioedema Neg Hx    Atopy Neg Hx    Immunodeficiency Neg Hx    Urticaria Neg Hx    Eczema Neg Hx    Social History:  reports that she quit smoking about 18 months ago. Her smoking use included cigarettes. She has never been exposed to tobacco smoke. She has quit using smokeless tobacco. She reports that she does not drink alcohol and does not use drugs.  Allergies:  Allergies  Allergen Reactions   Benzonatate Palpitations   Aspirin Palpitations   Azithromycin Palpitations   Codeine Palpitations    Medications Prior to Admission  Medication Sig Dispense Refill   amLODipine (NORVASC) 5 MG tablet Take 5 mg by mouth daily.     azelastine (ASTELIN) 0.1 % nasal spray Place 2 sprays into both nostrils 2 (two) times daily as needed for rhinitis. 30 mL 5   famotidine (PEPCID) 20 MG tablet Take 1 tablet (20 mg total) by mouth 2 (two) times daily. 180 tablet 1   metFORMIN (GLUCOPHAGE-XR) 500 MG 24 hr tablet Take 1,000 mg by mouth 2 (two) times daily.     montelukast (SINGULAIR) 10 MG tablet Take 1 tablet (10 mg total) by mouth at bedtime. 90 tablet 1   pravastatin (PRAVACHOL) 40 MG tablet Take 40 mg by mouth daily.     aspirin EC 81 MG tablet Take 81 mg by mouth daily. Swallow whole.     cetirizine (ZYRTEC ALLERGY) 10 MG tablet Take  1 tablet (10 mg total) by mouth daily. 90 tablet 1   diclofenac Sodium (VOLTAREN) 1 % GEL Apply 1 application. topically 2 (two) times daily.     fluticasone (FLONASE) 50 MCG/ACT nasal spray Place 2 sprays into both nostrils daily. 16 g 3   JARDIANCE 10 MG TABS tablet Take 10 mg by mouth daily.     lisinopril (ZESTRIL) 40 MG tablet Take 40 mg by mouth daily.      Results for orders placed or performed during the hospital encounter of 10/17/23 (from the past 48 hour(s))  Basic metabolic panel per protocol     Status: Abnormal   Collection Time: 10/17/23 10:59 AM   Result Value Ref Range   Sodium 135 135 - 145 mmol/L   Potassium 3.5 3.5 - 5.1 mmol/L   Chloride 103 98 - 111 mmol/L   CO2 23 22 - 32 mmol/L   Glucose, Bld 180 (H) 70 - 99 mg/dL    Comment: Glucose reference range applies only to samples taken after fasting for at least 8 hours.   BUN 11 8 - 23 mg/dL   Creatinine, Ser 8.11 0.44 - 1.00 mg/dL   Calcium 8.9 8.9 - 91.4 mg/dL   GFR, Estimated >78 >29 mL/min    Comment: (NOTE) Calculated using the CKD-EPI Creatinine Equation (2021)    Anion gap 9 5 - 15    Comment: Performed at [facility], [address] [place], [place] [address]  CBC per protocol     Status: Abnormal   Collection Time: 10/17/23 10:59 AM  Result Value Ref Range   WBC 7.6 4.0 - 10.5 K/uL   RBC 4.39 3.87 - 5.11 MIL/uL   Hemoglobin 11.5 (L) 12.0 - 15.0 g/dL   HCT 08.6 57.8 - 46.9 %   MCV 84.7 80.0 - 100.0 fL   MCH 26.2 26.0 - 34.0 pg   MCHC 30.9 30.0 - 36.0 g/dL   RDW 62.9 52.8 - 41.3 %   Platelets 476 (H) 150 - 400 K/uL   nRBC 0.0 0.0 - 0.2 %    Comment: Performed at [facility], [address] [place], [place] [address]  Glucose, capillary     Status: Abnormal   Collection Time: 10/17/23 11:07 AM  Result Value Ref Range   Glucose-Capillary 173 (H) 70 - 99 mg/dL    Comment: Glucose reference range applies only to samples taken after fasting for at least 8 hours.   No results found.  Review of Systems As per HPI Blood pressure 137/69, pulse 80, temperature 98.3 F (36.8 C), temperature source Oral, resp. rate 18, height 5\' 4"  (1.626 m), weight 70.3 kg, SpO2 98%. Physical Exam  Gen: Pleasant, well-nourished, in no distress,  normal affect  ENT: No lesions,  mouth clear,  oropharynx clear, no postnasal drip  Neck: No JVD, no stridor  Lungs: No use of accessory muscles, no crackles or wheezing on normal respiration, no wheeze on forced expiration  Cardiovascular: RRR, heart sounds normal, no murmur or gallops, no peripheral edema  Abdomen:  soft and NT, no HSM,  BS normal  Musculoskeletal: No deformities, no cyanosis or clubbing  Neuro: alert, awake, non focal  Skin: Warm, no lesions or rashes    Assessment/Plan Right hilar adenopathy, right lower lobe opacity concerning for recurrence of her non-small cell lung cancer.  Plan is for robotic assisted navigational bronchoscopy to the right lower lobe and endobronchial ultrasound to the right hilum.  The patient understands  risk, benefits and rationale.  She agrees to proceed.  No barriers identified.  Leslye Peer, MD 10/17/2023, 12:57 PM

## 2023-10-17 NOTE — H&P (Incomplete)
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 Pt reports procedure went well but when she arrived home and took her home medications as normal but then developed tachypnea, oral swelling. Upon further review it was noted that lisinopril is on her home med list. Improvement was noted upon giving pt h2 blocker, diphenhydramine and steroids. Recommended txa as well. Ordered complement levels and transferred to [facility] for close monitoring of airway.   Upon arrival to [facility] pt endorses improvement in her symptoms. Denies sob at this time, is having some difficulty with secretions but able to cough it up and then suction.   Pertinent  Medical History  H/o lung Ca Htn ?dm2 with hyperglycemia   Significant Hospital Events: Including procedures, antibiotic start and stop dates in addition to other pertinent events   S/p bronch with bx 10/24 am Re presented 10/24 with swelling: concern for angioedema, admitted to ICU for airway watch  Interim History / Subjective:   Objective   Blood pressure 101/72, pulse 83, temperature (!) 97.1 F (36.2 C), temperature source Oral, resp. rate 18, height 5\' 4"  (1.626 m), weight 70.4 kg, SpO2 98%.        Intake/Output Summary (Last 24 hours) at 10/17/2023 2104 Last data filed at 10/17/2023 2051 Gross per 24 hour  Intake 50 ml  Output --  Net 50 ml   Filed Weights   10/17/23 2013  Weight: 70.4 kg    Examination: General: *** HENT: *** Lungs: *** Cardiovascular: *** Abdomen: *** Extremities: *** Neuro: *** GU: ***  Resolved Hospital Problem list     Assessment & Plan:  Angioedema:  -suspected 2/2 lisinopril -airway monitoring -transfer to [facility] for closer observation -cont with  h2 blockers, benadryl, steroids -check factors - tranexamic acid being given  Copd with known emphysema:  RLL mass  R hilar adenopathy -s/p recent bronchoscopy 2/2 concern for reoccurrence of  lung Ca -f/u pathology  Hyponatremia Hyperglycemia -ssi -check A1c  Normocytic anemia:  thrombocytosis -no acute indication for transfusion -follow trend  htn  Best Practice (right click and "Reselect all SmartList Selections" daily)   Diet/type: NPO DVT prophylaxis: SCD GI prophylaxis: N/A Lines: N/A Foley:  N/A Code Status:  full code Last date of multidisciplinary goals of care discussion [pending]  Labs   CBC: Recent Labs  Lab 10/17/23 1059 10/17/23 2019  WBC 7.6 7.9  NEUTROABS  --  6.9  HGB 11.5* 11.9*  HCT 37.2 38.8  MCV 84.7 85.7  PLT 476* 519*    Basic Metabolic Panel: Recent Labs  Lab 10/17/23 1059 10/17/23 2019  NA 135 134*  K 3.5 4.2  CL 103 100  CO2 23 22  GLUCOSE 180* 249*  BUN 11 16  CREATININE 0.73 0.66  CALCIUM 8.9 8.9   GFR: Estimated Creatinine Clearance: 60.3 mL/min (by C-G formula based on SCr of 0.66 mg/dL). Recent Labs  Lab 10/17/23 1059 10/17/23 2019  WBC 7.6 7.9    Liver Function Tests: Recent Labs  Lab 10/17/23 2019  AST 10*  ALT 8  ALKPHOS 55  BILITOT 0.6  PROT 8.3*  ALBUMIN 3.0*   No results for input(s): "LIPASE", "AMYLASE" in the last 168 hours. No results for input(s): "AMMONIA" in the  last 168 hours.  ABG No results found for: "PHART", "PCO2ART", "PO2ART", "HCO3", "TCO2", "ACIDBASEDEF", "O2SAT"   Coagulation Profile: No results for input(s): "INR", "PROTIME" in the last 168 hours.  Cardiac Enzymes: No results for input(s): "CKTOTAL", "CKMB", "CKMBINDEX", "TROPONINI" in the last 168 hours.  HbA1C: No results found for: "HGBA1C"  CBG: Recent Labs  Lab 10/17/23 1107 10/17/23 1256 10/17/23 2028  GLUCAP 173* 144* 246*    Review of Systems:   As per HPI  Past Medical History:  She,  has a past  medical history of Angioedema (10/17/2023), Arthritis, Diabetes mellitus without complication (HCC), and Hypertension.   Surgical History:   Past Surgical History:  Procedure Laterality Date  . BRONCHIAL BIOPSY  03/30/2022   Procedure: BRONCHIAL BIOPSIES;  Surgeon: Josephine Igo, DO;  Location: [facility];  Service: Pulmonary;;  . BRONCHIAL BRUSHINGS  03/30/2022   Procedure: BRONCHIAL BRUSHINGS;  Surgeon: Josephine Igo, DO;  Location: [facility];  Service: Pulmonary;;  . BRONCHIAL NEEDLE ASPIRATION BIOPSY  03/30/2022   Procedure: BRONCHIAL NEEDLE ASPIRATION BIOPSIES;  Surgeon: Josephine Igo, DO;  Location: [facility];  Service: Pulmonary;;  . EYE SURGERY    . FIDUCIAL MARKER PLACEMENT  03/30/2022   Procedure: FIDUCIAL MARKER PLACEMENT;  Surgeon: Josephine Igo, DO;  Location: [facility];  Service: Pulmonary;;  . FOREIGN BODY REMOVAL  03/30/2022   Procedure: FOREIGN BODY REMOVAL;  Surgeon: Josephine Igo, DO;  Location: [facility];  Service: Pulmonary;;  . VIDEO BRONCHOSCOPY WITH ENDOBRONCHIAL ULTRASOUND Bilateral 03/30/2022   Procedure: VIDEO BRONCHOSCOPY WITH ENDOBRONCHIAL ULTRASOUND;  Surgeon: Josephine Igo, DO;  Location: [facility];  Service: Pulmonary;  Laterality: Bilateral;  . VIDEO BRONCHOSCOPY WITH RADIAL ENDOBRONCHIAL ULTRASOUND  03/30/2022   Procedure: VIDEO BRONCHOSCOPY WITH RADIAL ENDOBRONCHIAL ULTRASOUND;  Surgeon: Josephine Igo, DO;  Location: [facility];  Service: Pulmonary;;     Social History:   reports that she quit smoking about 18 months ago. Her smoking use included cigarettes. She has never been exposed to tobacco smoke. She has quit using smokeless tobacco. She reports that she does not drink alcohol and does not use drugs.   Family History:  Her family history includes Allergic rhinitis in her sister; Asthma in her sister. There is no history of Angioedema, Atopy, Immunodeficiency, Urticaria, or Eczema.   Allergies Allergies  Allergen  Reactions  . Benzonatate Palpitations  . Aspirin Palpitations  . Azithromycin Palpitations  . Codeine Palpitations     Home Medications  Prior to Admission medications   Medication Sig Start Date End Date Taking? Authorizing Provider  amLODipine (NORVASC) 5 MG tablet Take 5 mg by mouth daily. 02/08/22   [provider]  aspirin EC 81 MG tablet Take 81 mg by mouth daily. Swallow whole.    [provider]  azelastine (ASTELIN) 0.1 % nasal spray Place 2 sprays into both nostrils 2 (two) times daily as needed for rhinitis. 02/22/23   Alfonse Spruce, MD  cetirizine (ZYRTEC ALLERGY) 10 MG tablet Take 1 tablet (10 mg total) by mouth daily. 02/22/23   Alfonse Spruce, MD  diclofenac Sodium (VOLTAREN) 1 % GEL Apply 1 application. topically 2 (two) times daily. 02/08/22   [provider]  famotidine (PEPCID) 20 MG tablet Take 1 tablet (20 mg total) by mouth 2 (two) times daily. 02/22/23   Alfonse Spruce, MD  fluticasone [facility]) 50 MCG/ACT nasal spray Place 2 sprays into both nostrils daily. 02/22/23   Alfonse Spruce, MD  JARDIANCE  10 MG TABS tablet Take 10 mg by mouth daily. 02/08/22   [provider]  metFORMIN (GLUCOPHAGE-XR) 500 MG 24 hr tablet Take 1,000 mg by mouth 2 (two) times daily. 02/08/22   [provider]  montelukast (SINGULAIR) 10 MG tablet Take 1 tablet (10 mg total) by mouth at bedtime. 02/22/23   Alfonse Spruce, MD  pravastatin (PRAVACHOL) 40 MG tablet Take 40 mg by mouth daily.    [provider]     Critical care time: ***

## 2023-10-18 DIAGNOSIS — R0602 Shortness of breath: Secondary | ICD-10-CM | POA: Diagnosis not present

## 2023-10-18 DIAGNOSIS — Z85118 Personal history of other malignant neoplasm of bronchus and lung: Secondary | ICD-10-CM | POA: Diagnosis not present

## 2023-10-18 DIAGNOSIS — C3431 Malignant neoplasm of lower lobe, right bronchus or lung: Secondary | ICD-10-CM | POA: Diagnosis not present

## 2023-10-18 DIAGNOSIS — I10 Essential (primary) hypertension: Secondary | ICD-10-CM | POA: Diagnosis not present

## 2023-10-18 DIAGNOSIS — T783XXA Angioneurotic edema, initial encounter: Secondary | ICD-10-CM | POA: Diagnosis not present

## 2023-10-18 DIAGNOSIS — R0600 Dyspnea, unspecified: Secondary | ICD-10-CM

## 2023-10-18 LAB — GLUCOSE, CAPILLARY
Glucose-Capillary: 246 mg/dL — ABNORMAL HIGH (ref 70–99)
Glucose-Capillary: 222 mg/dL — ABNORMAL HIGH (ref 70–99)
Glucose-Capillary: 211 mg/dL — ABNORMAL HIGH (ref 70–99)
Glucose-Capillary: 277 mg/dL — ABNORMAL HIGH (ref 70–99)

## 2023-10-18 LAB — HEMOGLOBIN A1C
Hgb A1c MFr Bld: 8.3 % — ABNORMAL HIGH (ref 4.8–5.6)
Mean Plasma Glucose: 191.51 mg/dL

## 2023-10-18 LAB — CYTOLOGY - NON PAP

## 2023-10-18 MED ORDER — HYDRALAZINE HCL 20 MG/ML IJ SOLN: 10.0000 mg | INTRAMUSCULAR | Status: DC | PRN

## 2023-10-18 MED ORDER — INSULIN ASPART 100 UNIT/ML IJ SOLN
5.0000 [IU] | Freq: Once | INTRAMUSCULAR | Status: AC
  Administered 2023-10-18: 5 [IU] via SUBCUTANEOUS

## 2023-10-18 MED ORDER — PREDNISONE 20 MG PO TABS: 40.0000 mg | ORAL_TABLET | Freq: Every day | ORAL | 0 refills | Status: AC

## 2023-10-18 MED ORDER — AMLODIPINE BESYLATE 5 MG PO TABS
5.0000 mg | ORAL_TABLET | Freq: Every day | ORAL | Status: DC
  Administered 2023-10-18: 5 mg via ORAL
  Filled 2023-10-18: qty 1

## 2023-10-18 MED ORDER — FAMOTIDINE 20 MG PO TABS
20.0000 mg | ORAL_TABLET | Freq: Once | ORAL | Status: AC
  Administered 2023-10-18: 20 mg via ORAL
  Filled 2023-10-18: qty 1

## 2023-10-18 NOTE — Discharge Summary (Addendum)
Name: Lindsey Williamson MRN: [id number] DOB: May 15, 1951 72 y.o. PCP: [name], MD  Date of Admission: 10/17/2023  7:43 PM Date of Discharge:  10/18/2023 Attending Physician: Dr.  Levon Hedger  DISCHARGE DIAGNOSIS:  Primary Problem: Angioedema   Hospital Problems: Principal Problem:   Angioedema    DISCHARGE MEDICATIONS:   Allergies as of 10/18/2023       Reactions   Benzonatate Palpitations   Lisinopril Swelling, Other (See Comments)   Patient ended up in the ED with a swollen mouth and tongue   Aspirin Palpitations, Nausea And Vomiting   Can tolerate baby aspirin without difficulty   Azithromycin Palpitations   Codeine Palpitations        Medication List     TAKE these medications    amLODipine 5 MG tablet Commonly known as: NORVASC Take 5 mg by mouth daily.   aspirin EC 81 MG tablet Take 81 mg by mouth daily. Swallow whole.   azelastine 0.1 % nasal spray Commonly known as: ASTELIN Place 2 sprays into both nostrils 2 (two) times daily as needed for rhinitis.   cetirizine 10 MG tablet Commonly known as: ZyrTEC Allergy Take 1 tablet (10 mg total) by mouth daily.   diclofenac Sodium 1 % Gel Commonly known as: VOLTAREN Apply 1 application. topically 2 (two) times daily.   famotidine 20 MG tablet Commonly known as: PEPCID Take 1 tablet (20 mg total) by mouth 2 (two) times daily.   fluticasone 50 MCG/ACT nasal spray Commonly known as: FLONASE Place 2 sprays into both nostrils daily.   Jardiance 10 MG Tabs tablet Generic drug: empagliflozin Take 10 mg by mouth daily.   metFORMIN 500 MG 24 hr tablet Commonly known as: GLUCOPHAGE-XR Take 1,000 mg by mouth 2 (two) times daily.   montelukast 10 MG tablet Commonly known as: Singulair Take 1 tablet (10 mg total) by mouth at bedtime.   pravastatin 40 MG tablet Commonly known as: PRAVACHOL Take 40 mg by mouth daily.   predniSONE 20 MG tablet Commonly known as: DELTASONE Take 2 tablets (40 mg  total) by mouth daily for 5 days.         DISPOSITION AND FOLLOW-UP:  Ms.Lindsey Williamson was discharged from [facility] in good condition. At the hospital follow up visit please address:  Angioedema: Ensure patient has stopped taking Lisinopril and has had no further episodes. Follow-up on complement lab results.  Hypertension: Lisinopril stopped so patient might be in need of additional anti-hypertensive. Patient to bring in BP log to office visit.  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 Pt reported procedure went well, she arrived home and took her home medications as normal but then developed tachypnea, oral swelling. Upon further review it was noted that lisinopril is on her home med list. Improvement was noted upon giving pt h2 blocker, diphenhydramine, steroids, and TXA. Ordered complement levels and transferred to [facility] for close monitoring of airway.    Upon arrival to [facility] pt endorsed improvement in her symptoms. Denied sob at that time,was having some difficulty with secretions associated with bronchoscopy but was able to cough it up and then suction. She was able to speak which she stated was previously a challenge.  Patient with significant improvement and without complications after aforementioned treatment. Stable for discharge on 5 day course of Prednisone.  DISCHARGE  INSTRUCTIONS:    SUBJECTIVE:  Patient feels well overall and is comfortable with discharge. Discharge Vitals:   BP (!) 141/72 (BP Location: Right Arm)   Pulse (!) 105   Temp 98.1 F (36.7 C)   Resp (!) 22   Ht 5\' 4"  (1.626 m)   Wt 69.7 kg   SpO2 97%   BMI 26.38 kg/m   OBJECTIVE:  Physical Exam: Constitutional: well-appearing, lying in bed, in no acute distress HENT: mucous membranes moist, minimal tongue/cheek  swelling Cardiovascular: regular rate w/ occasional PAC's Pulmonary/Chest: normal work of breathing on room air, lungs clear to auscultation bilaterally Abdominal: soft, non-tender, non-distended Skin: warm and dry Psych: normal mood and behavior     Pertinent Labs, Studies, and Procedures:     Latest Ref Rng & Units 10/17/2023    8:19 PM 10/17/2023   10:59 AM 03/30/2022    9:44 AM  CBC  WBC 4.0 - 10.5 K/uL 7.9  7.6  6.7   Hemoglobin 12.0 - 15.0 g/dL 78.2  95.6  21.3   Hematocrit 36.0 - 46.0 % 38.8  37.2  44.0   Platelets 150 - 400 K/uL 519  476  295        Latest Ref Rng & Units 10/17/2023    8:19 PM 10/17/2023   10:59 AM 09/23/2023    1:32 PM  CMP  Glucose 70 - 99 mg/dL 086  578    BUN 8 - 23 mg/dL 16  11    Creatinine 4.69 - 1.00 mg/dL 6.29  5.28  4.13   Sodium 135 - 145 mmol/L 134  135    Potassium 3.5 - 5.1 mmol/L 4.2  3.5    Chloride 98 - 111 mmol/L 100  103    CO2 22 - 32 mmol/L 22  23    Calcium 8.9 - 10.3 mg/dL 8.9  8.9    Total Protein 6.5 - 8.1 g/dL 8.3     Total Bilirubin 0.3 - 1.2 mg/dL 0.6     Alkaline Phos 38 - 126 U/L 55     AST 15 - 41 U/L 10     ALT 0 - 44 U/L 8       DG Chest Port 1 View  Result Date: 10/17/2023 CLINICAL DATA:  Post bronchoscopy with biopsy EXAM: PORTABLE CHEST 1 VIEW COMPARISON:  CT 09/23/2023 FINDINGS: Diffuse reticular opacity, corresponding to emphysema and chronic lung disease. Opacity at the right base corresponding to area of concern on prior CT imaging, fiducial marker in the region. No pleural effusions. No convincing pneumothorax. IMPRESSION: 1. No convincing pneumothorax. 2. Emphysema and chronic lung disease. Right base opacity corresponding to radiation change and masslike consolidation on prior CT imaging. Electronically Signed   By: Jasmine Pang M.D.   On: 10/17/2023 18:46   DG C-ARM BRONCHOSCOPY  Result Date: 10/17/2023 C-ARM BRONCHOSCOPY: Fluoroscopy was utilized by the requesting physician.  No radiographic  interpretation.     Signed: Carmina Miller, DO  Internal Medicine Resident, PGY-1 [facility]  Pager: [phone] 9:59 AM, 10/18/2023

## 2023-10-18 NOTE — Discharge Instructions (Addendum)
You were treated for a conditional called angioedema (swelling) that was most likely caused by a side-effect associated with Lisinopril. You have responded well to treatment and are stable for discharge home. Please STOP taking Lisinopril and follow-up with your primary care doctor regarding the potential need to add another blood pressure medication. A steroid medication called Prednisone was sent to your pharmacy which you will take for 5 days to help further treat the angioedema.  Please return to the ED if you develop swelling similar to what you previously experienced.

## 2023-10-18 NOTE — Care Management Obs Status (Signed)
MEDICARE OBSERVATION STATUS NOTIFICATION   Patient Details  Name: Lindsey Williamson MRN: [id number] Date of Birth: [birth year]   Medicare Observation Status Notification Given:  Yes    Lorri Frederick, LCSW 10/18/2023, 1:19 PM

## 2023-10-18 NOTE — Anesthesia Postprocedure Evaluation (Signed)
Anesthesia Post Note  Patient: Lindsey Williamson  Procedure(s) Performed: ROBOTIC ASSISTED NAVIGATIONAL BRONCHOSCOPY (Right) VIDEO BRONCHOSCOPY WITH ENDOBRONCHIAL ULTRASOUND (Right) BRONCHIAL NEEDLE ASPIRATION BIOPSIES BRONCHIAL BRUSHINGS BRONCHIAL BIOPSIES HEMOSTASIS CONTROL FINE NEEDLE ASPIRATION     Patient location during evaluation: PACU Anesthesia Type: General Level of consciousness: awake and alert Pain management: pain level controlled Vital Signs Assessment: post-procedure vital signs reviewed and stable Respiratory status: spontaneous breathing, nonlabored ventilation and respiratory function stable Cardiovascular status: blood pressure returned to baseline Postop Assessment: no apparent nausea or vomiting Anesthetic complications: no   No notable events documented.  Last Vitals:  Vitals:   10/17/23 1510 10/17/23 1520  BP: 137/64 139/69  Pulse: 82 84  Resp: 20 (!) 26  Temp:    SpO2: 92% 92%    Last Pain:  Vitals:   10/17/23 1520  TempSrc:   PainSc: 0-No pain                 Shanda Howells

## 2023-10-18 NOTE — Care Management CC44 (Signed)
Condition Code 44 Documentation Completed  Patient Details  Name: Lindsey Williamson MRN: [id number] Date of Birth: Jun 12, 1951   Condition Code 44 given:  Yes Patient signature on Condition Code 44 notice:  Yes Documentation of 2 MD's agreement:    Code 44 added to claim:  Yes    Lorri Frederick, LCSW 10/18/2023, 1:19 PM

## 2023-10-19 ENCOUNTER — Telehealth: Payer: Self-pay

## 2023-10-19 ENCOUNTER — Telehealth: Payer: Self-pay | Admitting: *Deleted

## 2023-10-19 ENCOUNTER — Ambulatory Visit (HOSPITAL_COMMUNITY)
Admission: RE | Admit: 2023-10-19 | Discharge: 2023-10-19 | Disposition: A | Discharge status: Home / Self Care | Payer: Medicare Other | Source: Ambulatory Visit | Attending: Urology | Admitting: Urology

## 2023-10-19 DIAGNOSIS — C3431 Malignant neoplasm of lower lobe, right bronchus or lung: Secondary | ICD-10-CM | POA: Insufficient documentation

## 2023-10-19 LAB — CYTOLOGY - NON PAP

## 2023-10-19 LAB — C4 COMPLEMENT: Complement C4, Body Fluid: 47 mg/dL — ABNORMAL HIGH (ref 12–38)

## 2023-10-19 LAB — C1 ESTERASE INHIBITOR: C1INH SerPl-mCnc: <8 mg/dL — ABNORMAL LOW (ref 21–39)

## 2023-10-19 MED ORDER — GADOBUTROL 1 MMOL/ML IV SOLN
7.0000 mL | Freq: Once | INTRAVENOUS | Status: AC | PRN
  Administered 2023-10-19: 7 mL via INTRAVENOUS

## 2023-10-19 NOTE — Telephone Encounter (Signed)
CALLED PATIENT TO INFORM THAT ASHLYN BRUNING WILL CALL HER ON 10-21-23 @     11 AM WITH THE RESULTS FROM MRI ON 10-19-23, SPOKE WITH PATIENT AND SHE IS AWARE OF THIS CHANGE AND SHE IS GOOD WITH THIS CHANGE

## 2023-10-19 NOTE — Telephone Encounter (Signed)
Patient identity verified x2. Patient reports fatigue, and very mild, occasional hematopoiesis. She thinks that the hematopoiesis is related to a reaction from a possible medication (Lisinopril) but is unsure. Patient is wanting a call from [employer] PA-C to discuss these issues. I told patient that I would pass this info/ request to her care team here in Rad/Onc. I left my extension [phone] in case patient needs anything else. Patient verbalized understanding.   This concludes the interaction.  Ruel Favors, LPN

## 2023-10-20 ENCOUNTER — Telehealth: Payer: Self-pay | Admitting: Urology

## 2023-10-20 ENCOUNTER — Other Ambulatory Visit: Payer: Self-pay | Admitting: Urology

## 2023-10-20 ENCOUNTER — Ambulatory Visit: Payer: Medicare Other | Admitting: Urology

## 2023-10-20 DIAGNOSIS — C3431 Malignant neoplasm of lower lobe, right bronchus or lung: Secondary | ICD-10-CM

## 2023-10-20 NOTE — Telephone Encounter (Signed)
I called and spoke with Ms. Lindsey Williamson today to review the results of her recent MRI brain scan and bronchoscopy results. No brain mets on MRI from 10/19/23. Pathology from recent bronch procedure confirms NSCLC, squamous cell carcinoma, in the RLL sample and station 7 node. Samples from 4L and 11R were negative. Sounds like a Stage IIIA so we will get her referred to Dr. Arbutus Ped to discuss treatment options- likely chemoradiation unless the tissue is EGFR positive which might make her a candidate for targeted therapy, or high PD-L1 which could make her a candidate for immunotherapy. She will discuss this further with Dr. Arbutus Ped and I will plan to follow up with her thereafter to formalize treatment plans. She appears to have a good understanding of our recommendations and is comfortable and in agreement with the plan.  Marguarite Arbour, MMS, PA-C   [facility] Radiation Oncology Physician Assistant Direct Dial: [phone]  Fax: [phone]

## 2023-10-20 NOTE — Progress Notes (Signed)
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 Reindel returns today to review results of her recent PET imaging to further evaluate the new nodular consolidation in the RLL and increased size of mediastinal and right hilar lymph nodes with increased size of subpleural nodules of the right hemithorax seen on recent surveillance CT Chest from 09/23/23, highly concerning for metastatic disease.   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 We initially met her in consult on 04/08/22 and she subsequently elected to proceed with stereotactic body radiotherapy (SBRT) and completed 3 fractions to the RLL nodule in May 2023. She tolerated radiation treatment relatively well and remains without complaints.  She continued in routine follow-up with post-treatment surveillance scans every 6 months which had shown an excellent response to treatment up through 02/2023. Her CT chest scan from 03/17/23 showed stable borderline right hilar lymphadenopathy and a few peri-fissural subpleural nodes that were felt to be reactive, but no new nodules or evidence of disease progression or recurrence.  However, a follow up CT Chest scan in 08/2023 showed findings concerning for recurrence of disease with hilar and mediastinal nodal involvement. This was further evaluated with a PET scan, performed on 10/10/23 which shows focal hypermetabolism in the right lower lobe lung, suggesting recurrent disease as well as right hilar  and subcarinal nodal metastases but no distant metastatic disease in the abdomen or pelvis.  We reviewed these results in our multidisciplinary thoracic oncology conference this morning.  She is here today to review results and discuss recommendations.                     On review of systems, the patient states that she is doing well in general.  She has continued smoking, although she has cut back significantly to only a few cigarettes/day. She continues with some mild fatigue but has remained active, walking 1 to 2 miles per day for exercise.  She just recently attended her grand-daughter's wedding which she enjoyed greatly. She specifically denies any increased shortness of breath, chest pain, hemoptysis, productive cough, fevers, chills or night sweats.  She reports a decent appetite and is eating 3 meals/day to maintain her weight. Overall, she is quite pleased with her progress to date.  ALLERGIES:  is allergic to benzonatate, lisinopril, aspirin, azithromycin, and codeine.  Meds: Current Outpatient Medications  Medication Sig Dispense Refill   amLODipine (NORVASC) 5 MG tablet Take 5 mg by mouth daily.     aspirin EC 81 MG tablet Take 81 mg by mouth daily. Swallow whole.     azelastine (ASTELIN) 0.1 % nasal spray Place 2 sprays into both nostrils 2 (two) times daily as needed for rhinitis. 30 mL 5   cetirizine (ZYRTEC ALLERGY) 10 MG tablet Take 1 tablet (10 mg total) by mouth daily. 90 tablet 1   diclofenac Sodium (VOLTAREN) 1 % GEL Apply 1 application  topically 2 (two) times  daily as needed (Pain).     empagliflozin (JARDIANCE) 25 MG TABS tablet Take 25 mg by mouth daily.     famotidine (PEPCID) 20 MG tablet Take 1 tablet (20 mg total) by mouth 2 (two) times daily. 180 tablet 1   metFORMIN (GLUCOPHAGE-XR) 500 MG 24 hr tablet Take 1,000 mg by mouth 2 (two) times daily.     pravastatin (PRAVACHOL) 80 MG tablet Take 80 mg by mouth daily.     predniSONE (DELTASONE) 20 MG tablet Take 2 tablets  (40 mg total) by mouth daily for 5 days. 10 tablet 0   No current facility-administered medications for this encounter.    Physical Findings:  height is 5\' 4"  (1.626 m) and weight is 152 lb (68.9 kg). Her temporal temperature is 97.3 F (36.3 C) (abnormal). Her blood pressure is 147/70 (abnormal) and her pulse is 82. Her respiration is 20 and oxygen saturation is 97%.  Pain Assessment Pain Score: 0-No pain/10 In general this is a well appearing African-American female in no acute distress.  She's alert and oriented x4 and appropriate throughout the examination. Cardiopulmonary assessment is negative for acute distress and she exhibits normal effort.    Lab Findings: Lab Results  Component Value Date   WBC 7.9 10/17/2023   HGB 11.9 (L) 10/17/2023   HCT 38.8 10/17/2023   MCV 85.7 10/17/2023   PLT 519 (H) 10/17/2023     Radiographic Findings: MR Brain W Wo Contrast  Result Date: 10/19/2023 CLINICAL DATA:  Provided history: Non-small cell lung cancer, staging. Primary cancer of right lower lobe of lung. EXAM: MRI HEAD WITHOUT AND WITH CONTRAST TECHNIQUE: Multiplanar, multiecho pulse sequences of the brain and surrounding structures were obtained without and with intravenous contrast. CONTRAST:  7mL GADAVIST GADOBUTROL 1 MMOL/ML IV SOLN COMPARISON:  PET CT 10/10/2023. FINDINGS: The sagittal T1-weighted post-contrast sequence is moderately motion degraded. Within this limitation, findings are as follows. Brain: Mild generalized cerebral atrophy. Multifocal T2 FLAIR hyperintense signal abnormality within the cerebral white matter, nonspecific but compatible with moderate chronic small vessel ischemic disease. There is no acute infarct. No evidence of an intracranial mass. No chronic intracranial blood products. No extra-axial fluid collection. No midline shift. No pathologic intracranial enhancement identified. Vascular: Maintained flow voids within the proximal large arterial vessels. Small  left parietal lobe developmental venous anomaly (anatomic variant). Skull and upper cervical spine: No focal worrisome marrow lesion. Incompletely assessed cervical spondylosis. Sinuses/Orbits: No mass or acute finding within the imaged orbits. Prior bilateral ocular lens replacement. Minimal mucosal thickening within the left maxillary sinus. Other: Trace fluid within the left mastoid air cells. IMPRESSION: 1. Intermittently motion degraded examination as described. 2. No evidence of intracranial metastatic disease. 3. Moderate chronic small vessel ischemic changes within the cerebral white matter. 4. Mild generalized cerebral atrophy. 5. Minor left maxillary sinus mucosal thickening. Electronically Signed   By: Jackey Loge D.O.   On: 10/19/2023 20:52   DG Chest Port 1 View  Result Date: 10/17/2023 CLINICAL DATA:  Post bronchoscopy with biopsy EXAM: PORTABLE CHEST 1 VIEW COMPARISON:  CT 09/23/2023 FINDINGS: Diffuse reticular opacity, corresponding to emphysema and chronic lung disease. Opacity at the right base corresponding to area of concern on prior CT imaging, fiducial marker in the region. No pleural effusions. No convincing pneumothorax. IMPRESSION: 1. No convincing pneumothorax. 2. Emphysema and chronic lung disease. Right base opacity corresponding to radiation change and masslike consolidation on prior CT imaging. Electronically Signed   By: Adrian Prows.D.  On: 10/17/2023 18:46   DG C-ARM BRONCHOSCOPY  Result Date: 10/17/2023 C-ARM BRONCHOSCOPY: Fluoroscopy was utilized by the requesting physician.  No radiographic interpretation.   NM PET Image Restag (PS) Skull Base To Thigh  Result Date: 10/12/2023 CLINICAL DATA:  Subsequent treatment strategy for non-small cell lung cancer. EXAM: NUCLEAR MEDICINE PET SKULL BASE TO THIGH TECHNIQUE: 7.8 mCi F-18 FDG was injected intravenously. Full-ring PET imaging was performed from the skull base to thigh after the radiotracer. CT data was obtained  and used for attenuation correction and anatomic localization. Fasting blood glucose: 167 mg/dl COMPARISON:  CT chest dated 09/23/2023 FINDINGS: Mediastinal blood pool activity: SUV max 2.7 Liver activity: SUV max NA NECK: No hypermetabolic lymph nodes in the neck. Incidental CT findings: None. CHEST: 5.9 x 3.0 cm aggregate masslike opacity at the right lung base (series 7/image 43), reflecting radiation changes, but with associated focal hypermetabolism (max SUV 8.7), suggesting recurrence. 3.6 x 2.2 cm mass in the medial right lower lobe (series 7/image 31), max SUV 17.6, compatible with metastatic disease. Adjacent 2.4 cm subcarinal/right azygoesophageal recess node (series 4/image 70), max SUV 18.6. Associated right hilar node, poorly evaluated but measuring approximately 14 mm short axis (series 4/image 71), max SUV 17.5. Incidental CT findings: Mild centrilobular and paraseptal emphysematous changes. Atherosclerotic calcifications of the aortic arch. Moderate three-vessel coronary atherosclerosis. ABDOMEN/PELVIS: No abnormal hypermetabolic activity within the liver, pancreas, adrenal glands, or spleen. No hypermetabolic lymph nodes in the abdomen or pelvis. Incidental CT findings: Layering small gallstones. Left colonic diverticulosis, without evidence of diverticulitis. Uterine fibroids. Atherosclerotic calcifications of the abdominal aorta and branch vessels. SKELETON: No focal hypermetabolic activity to suggest skeletal metastasis. Incidental CT findings: Mild degenerative changes of the lumbar spine. IMPRESSION: Recurrent tumor at the right lung base, as described above. Associated right lower lobe metastasis. Right hilar and subcarinal nodal metastases, as above. No evidence of metastatic disease in the abdomen/pelvis. Electronically Signed   By: Charline Bills M.D.   On: 10/12/2023 11:22   CT Chest W Contrast  Result Date: 09/28/2023 CLINICAL DATA:  Non-small cell lung cancer; * Tracking Code: BO  * EXAM: CT CHEST WITH CONTRAST TECHNIQUE: Multidetector CT imaging of the chest was performed during intravenous contrast administration. RADIATION DOSE REDUCTION: This exam was performed according to the departmental dose-optimization program which includes automated exposure control, adjustment of the mA and/or kV according to patient size and/or use of iterative reconstruction technique. CONTRAST:  75mL OMNIPAQUE IOHEXOL 300 MG/ML  SOLN COMPARISON:  Multiple priors, most recent March 17, 2023 FINDINGS: Cardiovascular: Normal heart size. No pericardial effusion. Normal caliber thoracic aorta with moderate atherosclerotic disease. Severe coronary artery calcifications. Mediastinum/Nodes: Esophagus and thyroid are unremarkable. Increased size of mediastinal and right hilar lymph nodes. Reference right hilar lymph node measuring 0.7 cm in short axis on series 2, image 84, previously 1.1 cm. Reference subcarinal lymph node measuring 2.1 cm in short axis on series 2, image 73, previously 1.1 cm. Lungs/Pleura: Central airways are patent. Moderate paraseptal and centrilobular emphysema. Right lower lobe postradiation change with new nodular consolidation measuring 3.6 x 2.5 cm on series 6, image 96. Increased size of subpleural nodules of the right hemithorax. Right lower lobe nodule measuring 2.0 x 1.5 cm on series 6, image 68, previously 0.7 x 0.5 cm. Subpleural nodule of the right upper lobe measuring 6 mm on image 45, previously 4 mm. No pleural effusion. Upper Abdomen: Mild prominence of the main pancreatic duct, unchanged when compared with the prior exam. Musculoskeletal:  Old L1 compression fracture. No aggressive appearing osseous lesions. IMPRESSION: 1. Right lower lobe postradiation change with new nodular consolidation, concerning for local recurrence. 2. Increased size of mediastinal and right hilar lymph nodes and increased size of subpleural nodules of the right hemithorax, highly concerning for metastatic  disease. 3. Coronary artery calcifications, aortic Atherosclerosis (ICD10-I70.0) and Emphysema (ICD10-J43.9). Electronically Signed   By: Allegra Lai M.D.   On: 09/28/2023 08:50    Impression/Plan: 54. 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 We discussed the natural history of non-small cell lung cancer and general treatment, highlighting the role of radiotherapy in the management.  We discussed the consensus recommendation from the multidisciplinary thoracic oncology conference to proceed with bronchoscopy for tissue confirmation and molecular testing to determine options for systemic therapy.  We will also arrange for an MRI brain scan to complete her disease staging and I will call her with those results as soon as they are available.  At present, this appears to be a stage IIIa NSCLC, pending results of the MRI brain scan and pathology.  Once we have those results, we will get her referred to Dr. Arbutus Ped, in medical oncology, to further discuss her treatment options.  The patient and her husband were encouraged to ask questions that were answered to their stated satisfaction.  She appears to have a good understanding of these recommendations and is comfortable and in agreement with the stated plan.  She knows that she is welcome to call at anytime in the interim with any questions or concerns.  We personally spent 60 minutes in this encounter including chart review, reviewing radiological studies, face-to-face conversation with the patient and her husband, entering orders, coordinating her care and completing documentation.   Marguarite Arbour, PA-C    Margaretmary Dys, MD  [facility]  Radiation Oncology Direct Dial: [phone]  Fax: [phone] [email or web address]  Skype  [email or web address]

## 2023-10-21 ENCOUNTER — Encounter (HOSPITAL_COMMUNITY): Payer: Self-pay | Admitting: Emergency Medicine

## 2023-10-21 ENCOUNTER — Other Ambulatory Visit: Payer: Self-pay | Admitting: Physician Assistant

## 2023-10-21 ENCOUNTER — Ambulatory Visit: Payer: Medicare Other | Admitting: Urology

## 2023-10-21 DIAGNOSIS — C3431 Malignant neoplasm of lower lobe, right bronchus or lung: Secondary | ICD-10-CM

## 2023-10-21 NOTE — Progress Notes (Signed)
Request for tissue be sent to [facility] for molecular testing emailed to Lita Mains, [facility] pathology technician

## 2023-10-22 NOTE — Progress Notes (Unsigned)
[facility] [phone]   [phone]  CONSULT NOTE  REFERRING PHYSICIAN: Ashlyn Bruning PA-C  REASON FOR CONSULTATION:  Squamous Cell Carcinoma of the lung  HPI Lindsey Williamson is a 72 y.o. female  INTERVAL HISTORY: Lindsey Williamson 72 y.o. female with a past medical history significant for hypertension, hemorrhoids, diverticular disease of the colon, type 2 diabetes, trigeminal neuralgia, arthritis, and hyperlipidemia is referred to the clinic for non-small cell lung cancer, squamous cell carcinoma.  The patient has a history of stage Ia non-small cell lung cancer, adenocarcinoma of the right upper lobe. She underwent radiation to this area under the care of Dr. Kathrynn Running in April/May 2023.  She had been on observation since that time with surveillance imaging.  She had a CT scan in March 2024 which showed stable to borderline right hilar lymphadenopathy and a few perifissural subpleural nodules which were felt to be reactive.   Her most recent CT scan from 09/23/2023 showed new nodular consolidation in the right lower lobe and increased size of mediastinal and right hilar lymph nodes with increased size of subpleural nodules in the right hemithorax concerning for metastatic disease.  She was referred to pulmonary medicine who performed bronchoscopy on 10/17/2023. The final pathology ([id number]) of the station 7 lymph node, right lower lobe endobronchial brushing, and right lower lobe endobronchial biopsy showing squamous cell carcinoma. We will request PDL1 expression today.   The patient then had a PET scan and brain MRI showing recurrent tumor at the right lung base with associated right lower lobe metastasis, right hilar and subcarinal nodal metastasis and no evidence of metastatic disease in the abdomen and pelvis.  Her brain MRI was negative for metastatic disease  The patient follow-up with radiation oncology and they are planning  possible concurrent chemoradiation.   Overall, she feels okay but is nervous about her condition.  Denies any fever or chills.  She has night sweats every night which she has had for several years.  She is having some dental issues and has some weight loss associated with her dental concerns.  She states she needs teeth pulled.  She reports her breathing is "good".  Denies any dyspnea on exertion.  She has been having some cough since her biopsy and produces clear phlegm.  Overall the cough is subsiding compared to when she had her biopsy last week.  Denies any more blood-tinged sputum since her biopsy.  Denies any nausea, vomiting, diarrhea, constipation, headaches, or vision changes.  Her mother had cancer but the patient is not sure the primary type.  Her father passed away at the age of 43 due to cirrhosis.  Her brothers passed away 1 of which passed away secondary to congestive heart failure.  The patient worked at [employer].  She is married.  She has 5 children.  She is a current smoker but is interested in smoking cessation.  She is smoked for approximately 50 years averaging less than 1 pack of cigarettes per day.    HPI  Past Medical History:  Diagnosis Date   Angioedema 10/17/2023   Arthritis    Diabetes mellitus without complication (HCC)    Hypertension     Past Surgical History:  Procedure Laterality Date   BRONCHIAL BIOPSY  03/30/2022   Procedure: BRONCHIAL BIOPSIES;  Surgeon: Josephine Igo, DO;  Location: [facility];  Service: Pulmonary;;   BRONCHIAL BIOPSY  10/17/2023   Procedure: BRONCHIAL BIOPSIES;  Surgeon: Leslye Peer,  MD;  Location: [facility];  Service: Pulmonary;;   BRONCHIAL BRUSHINGS  03/30/2022   Procedure: BRONCHIAL BRUSHINGS;  Surgeon: Josephine Igo, DO;  Location: [facility];  Service: Pulmonary;;   BRONCHIAL BRUSHINGS  10/17/2023   Procedure: BRONCHIAL BRUSHINGS;  Surgeon: Leslye Peer, MD;  Location: [facility] ENDOSCOPY;  Service:  Pulmonary;;   BRONCHIAL NEEDLE ASPIRATION BIOPSY  03/30/2022   Procedure: BRONCHIAL NEEDLE ASPIRATION BIOPSIES;  Surgeon: Josephine Igo, DO;  Location: [facility];  Service: Pulmonary;;   BRONCHIAL NEEDLE ASPIRATION BIOPSY  10/17/2023   Procedure: BRONCHIAL NEEDLE ASPIRATION BIOPSIES;  Surgeon: Leslye Peer, MD;  Location: [facility];  Service: Pulmonary;;   EYE SURGERY     FIDUCIAL MARKER PLACEMENT  03/30/2022   Procedure: FIDUCIAL MARKER PLACEMENT;  Surgeon: Josephine Igo, DO;  Location: [facility];  Service: Pulmonary;;   FINE NEEDLE ASPIRATION  10/17/2023   Procedure: FINE NEEDLE ASPIRATION;  Surgeon: Leslye Peer, MD;  Location: [facility] ENDOSCOPY;  Service: Pulmonary;;   FOREIGN BODY REMOVAL  03/30/2022   Procedure: FOREIGN BODY REMOVAL;  Surgeon: Josephine Igo, DO;  Location: [facility]OS[facility];  Service: Pulmonary;;   HEMOSTASIS CONTROL  10/17/2023   Procedure: HEMOSTASIS CONTROL;  Surgeon: Leslye Peer, MD;  Location: [facility];  Service: Pulmonary;;   VIDEO BRONCHOSCOPY WITH ENDOBRONCHIAL ULTRASOUND Bilateral 03/30/2022   Procedure: VIDEO BRONCHOSCOPY WITH ENDOBRONCHIAL ULTRASOUND;  Surgeon: Josephine Igo, DO;  Location: [facility];  Service: Pulmonary;  Laterality: Bilateral;   VIDEO BRONCHOSCOPY WITH ENDOBRONCHIAL ULTRASOUND Right 10/17/2023   Procedure: VIDEO BRONCHOSCOPY WITH ENDOBRONCHIAL ULTRASOUND;  Surgeon: Leslye Peer, MD;  Location: [facility] ENDOSCOPY;  Service: Pulmonary;  Laterality: Right;   VIDEO BRONCHOSCOPY WITH RADIAL ENDOBRONCHIAL ULTRASOUND  03/30/2022   Procedure: VIDEO BRONCHOSCOPY WITH RADIAL ENDOBRONCHIAL ULTRASOUND;  Surgeon: Josephine Igo, DO;  Location: [facility] ENDOSCOPY;  Service: Pulmonary;;    Family History  Problem Relation Age of Onset   Allergic rhinitis Sister    Asthma Sister    Angioedema Neg Hx    Atopy Neg Hx    Immunodeficiency Neg Hx    Urticaria Neg Hx    Eczema Neg Hx     Social History Social History   Tobacco Use    Smoking status: Former    Current packs/day: 0.00    Types: Cigarettes    Quit date: 03/26/2022    Years since quitting: 1.5    Passive exposure: Never   Smokeless tobacco: Former  Building services engineer status: Never Used  Substance Use Topics   Alcohol use: No   Drug use: Never    Allergies  Allergen Reactions   Benzonatate Palpitations   Lisinopril Swelling and Other (See Comments)    Patient ended up in the ED with a swollen mouth and tongue   Aspirin Palpitations and Nausea And Vomiting    Can tolerate baby aspirin without difficulty   Azithromycin Palpitations   Codeine Palpitations    Current Outpatient Medications  Medication Sig Dispense Refill   prochlorperazine (COMPAZINE) 10 MG tablet Take 1 tablet (10 mg total) by mouth every 6 (six) hours as needed. 30 tablet 2   amLODipine (NORVASC) 5 MG tablet Take 5 mg by mouth daily.     aspirin EC 81 MG tablet Take 81 mg by mouth daily. Swallow whole.     azelastine (ASTELIN) 0.1 % nasal spray Place 2 sprays into both nostrils 2 (two) times daily as needed for rhinitis. 30 mL 5   cetirizine (  ZYRTEC ALLERGY) 10 MG tablet Take 1 tablet (10 mg total) by mouth daily. 90 tablet 1   diclofenac Sodium (VOLTAREN) 1 % GEL Apply 1 application  topically 2 (two) times daily as needed (Pain).     empagliflozin (JARDIANCE) 25 MG TABS tablet Take 25 mg by mouth daily.     famotidine (PEPCID) 20 MG tablet Take 1 tablet (20 mg total) by mouth 2 (two) times daily. 180 tablet 1   metFORMIN (GLUCOPHAGE-XR) 500 MG 24 hr tablet Take 1,000 mg by mouth 2 (two) times daily.     pravastatin (PRAVACHOL) 80 MG tablet Take 80 mg by mouth daily.     No current facility-administered medications for this visit.    REVIEW OF SYSTEMS:   Review of Systems  Constitutional: Positive for weight loss and appetite change secondary to dentition. Negative for chills, fatigue, and fever.   HENT: Negative for mouth sores, nosebleeds, sore throat and trouble  swallowing.   Eyes: Negative for eye problems and icterus.  Respiratory: Positive for cough. Negative for hemoptysis, shortness of breath and wheezing.   Cardiovascular: Negative for chest pain and leg swelling.  Gastrointestinal: Negative for abdominal pain, constipation, diarrhea, nausea and vomiting.  Genitourinary: Negative for bladder incontinence, difficulty urinating, dysuria, frequency and hematuria.   Musculoskeletal: Negative for back pain, gait problem, neck pain and neck stiffness.  Skin: Negative for itching and rash.  Neurological: Negative for dizziness, extremity weakness, gait problem, headaches, light-headedness and seizures.  Hematological: Negative for adenopathy. Does not bruise/bleed easily.  Psychiatric/Behavioral: Negative for confusion, depression and sleep disturbance. The patient is not nervous/anxious.     PHYSICAL EXAMINATION:  Blood pressure (!) 148/70, pulse 86, temperature 98.3 F (36.8 C), temperature source Oral, resp. rate 16, weight 154 lb 11.2 oz (70.2 kg), SpO2 97%.  ECOG PERFORMANCE STATUS: 1  Physical Exam  Constitutional: Oriented to person, place, and time and well-developed, well-nourished, and in no distress.  HENT:  Head: Normocephalic and atraumatic.  Mouth/Throat: Oropharynx is clear and moist. No oropharyngeal exudate.  Eyes: Conjunctivae are normal. Right eye exhibits no discharge. Left eye exhibits no discharge. No scleral icterus.  Neck: Normal range of motion. Neck supple.  Cardiovascular: Normal rate, regular rhythm, normal heart sounds and intact distal pulses.   Pulmonary/Chest: Effort normal and breath sounds normal. No respiratory distress. No wheezes. No rales.  Abdominal: Soft. Bowel sounds are normal. Exhibits no distension and no mass. There is no tenderness.  Musculoskeletal: Normal range of motion. Exhibits no edema.  Lymphadenopathy:    No cervical adenopathy.  Neurological: Alert and oriented to person, place, and time.  Exhibits normal muscle tone. Gait normal. Coordination normal.  Skin: Skin is warm and dry. No rash noted. Not diaphoretic. No erythema. No pallor.  Psychiatric: Mood, memory and judgment normal.  Vitals reviewed.  LABORATORY DATA: Lab Results  Component Value Date   WBC 8.3 10/25/2023   HGB 11.4 (L) 10/25/2023   HCT 35.4 (L) 10/25/2023   MCV 82.9 10/25/2023   PLT 498 (H) 10/25/2023      Chemistry      Component Value Date/Time   NA 136 10/25/2023 1313   K 4.3 10/25/2023 1313   CL 103 10/25/2023 1313   CO2 24 10/25/2023 1313   BUN 16 10/25/2023 1313   CREATININE 0.77 10/25/2023 1313      Component Value Date/Time   CALCIUM 9.8 10/25/2023 1313   ALKPHOS 59 10/25/2023 1313   AST 9 (L) 10/25/2023 1313   ALT <  5 10/25/2023 1313   BILITOT 0.3 10/25/2023 1313       RADIOGRAPHIC STUDIES: MR Brain W Wo Contrast  Result Date: 10/19/2023 CLINICAL DATA:  Provided history: Non-small cell lung cancer, staging. Primary cancer of right lower lobe of lung. EXAM: MRI HEAD WITHOUT AND WITH CONTRAST TECHNIQUE: Multiplanar, multiecho pulse sequences of the brain and surrounding structures were obtained without and with intravenous contrast. CONTRAST:  7mL GADAVIST GADOBUTROL 1 MMOL/ML IV SOLN COMPARISON:  PET CT 10/10/2023. FINDINGS: The sagittal T1-weighted post-contrast sequence is moderately motion degraded. Within this limitation, findings are as follows. Brain: Mild generalized cerebral atrophy. Multifocal T2 FLAIR hyperintense signal abnormality within the cerebral white matter, nonspecific but compatible with moderate chronic small vessel ischemic disease. There is no acute infarct. No evidence of an intracranial mass. No chronic intracranial blood products. No extra-axial fluid collection. No midline shift. No pathologic intracranial enhancement identified. Vascular: Maintained flow voids within the proximal large arterial vessels. Small left parietal lobe developmental venous anomaly  (anatomic variant). Skull and upper cervical spine: No focal worrisome marrow lesion. Incompletely assessed cervical spondylosis. Sinuses/Orbits: No mass or acute finding within the imaged orbits. Prior bilateral ocular lens replacement. Minimal mucosal thickening within the left maxillary sinus. Other: Trace fluid within the left mastoid air cells. IMPRESSION: 1. Intermittently motion degraded examination as described. 2. No evidence of intracranial metastatic disease. 3. Moderate chronic small vessel ischemic changes within the cerebral white matter. 4. Mild generalized cerebral atrophy. 5. Minor left maxillary sinus mucosal thickening. Electronically Signed   By: Jackey Loge D.O.   On: 10/19/2023 20:52   DG Chest Port 1 View  Result Date: 10/17/2023 CLINICAL DATA:  Post bronchoscopy with biopsy EXAM: PORTABLE CHEST 1 VIEW COMPARISON:  CT 09/23/2023 FINDINGS: Diffuse reticular opacity, corresponding to emphysema and chronic lung disease. Opacity at the right base corresponding to area of concern on prior CT imaging, fiducial marker in the region. No pleural effusions. No convincing pneumothorax. IMPRESSION: 1. No convincing pneumothorax. 2. Emphysema and chronic lung disease. Right base opacity corresponding to radiation change and masslike consolidation on prior CT imaging. Electronically Signed   By: Jasmine Pang M.D.   On: 10/17/2023 18:46   DG C-ARM BRONCHOSCOPY  Result Date: 10/17/2023 C-ARM BRONCHOSCOPY: Fluoroscopy was utilized by the requesting physician.  No radiographic interpretation.   NM PET Image Restag (PS) Skull Base To Thigh  Result Date: 10/12/2023 CLINICAL DATA:  Subsequent treatment strategy for non-small cell lung cancer. EXAM: NUCLEAR MEDICINE PET SKULL BASE TO THIGH TECHNIQUE: 7.8 mCi F-18 FDG was injected intravenously. Full-ring PET imaging was performed from the skull base to thigh after the radiotracer. CT data was obtained and used for attenuation correction and  anatomic localization. Fasting blood glucose: 167 mg/dl COMPARISON:  CT chest dated 09/23/2023 FINDINGS: Mediastinal blood pool activity: SUV max 2.7 Liver activity: SUV max NA NECK: No hypermetabolic lymph nodes in the neck. Incidental CT findings: None. CHEST: 5.9 x 3.0 cm aggregate masslike opacity at the right lung base (series 7/image 43), reflecting radiation changes, but with associated focal hypermetabolism (max SUV 8.7), suggesting recurrence. 3.6 x 2.2 cm mass in the medial right lower lobe (series 7/image 31), max SUV 17.6, compatible with metastatic disease. Adjacent 2.4 cm subcarinal/right azygoesophageal recess node (series 4/image 70), max SUV 18.6. Associated right hilar node, poorly evaluated but measuring approximately 14 mm short axis (series 4/image 71), max SUV 17.5. Incidental CT findings: Mild centrilobular and paraseptal emphysematous changes. Atherosclerotic calcifications of the aortic arch. Moderate  three-vessel coronary atherosclerosis. ABDOMEN/PELVIS: No abnormal hypermetabolic activity within the liver, pancreas, adrenal glands, or spleen. No hypermetabolic lymph nodes in the abdomen or pelvis. Incidental CT findings: Layering small gallstones. Left colonic diverticulosis, without evidence of diverticulitis. Uterine fibroids. Atherosclerotic calcifications of the abdominal aorta and branch vessels. SKELETON: No focal hypermetabolic activity to suggest skeletal metastasis. Incidental CT findings: Mild degenerative changes of the lumbar spine. IMPRESSION: Recurrent tumor at the right lung base, as described above. Associated right lower lobe metastasis. Right hilar and subcarinal nodal metastases, as above. No evidence of metastatic disease in the abdomen/pelvis. Electronically Signed   By: Charline Bills M.D.   On: 10/12/2023 11:22    ASSESSMENT:    ASSESSMENT/PLAN:  1) history of stage Ia non-small cell lung cancer, adenocarcinoma the right upper lobe who under went radiation  under the care of Dr. Kathrynn Running from 04/23/2022-04/29/22 2 Stage IIIa confirm (T3, N2, M0) non-small cell lung cancer, squamous cell carcinoma. She presented with recurrent tumor at the right lung base, associated right lower lobe metastasis, and right hilar and subcarinal nodal metastases. This was diagnosed in October 2024. We will request PDL1 today.   The patient was seen with Dr. Arbutus Ped today.  Dr. Arbutus Ped had lengthy discussion with the patient today about her current condition and recommended treatment options.  Dr. Arbutus Ped recommended carboplatin for an AUC of 2 and paclitaxel 45 mg/m weekly with concurrent radiation.  The patient is interested in this option and she is expected to start her first dose of treatment on 11/07/23  The adverse side effects of treatment were discussed including not limited to fatigue, myelosuppression, nausea, vomiting, peripheral neuropathy, kidney, liver dysfunction.  I will arrange for chemo education class prior to starting her first cycle of treatment.  The patient is having some dental issues.  It likely would not be a good idea to get her teeth pulled throughout the course of chemotherapy.  This would need to be completed prior to chemotherapy or afterwards due to risk of infection.  We talked about soft food diet and I encouraged her to drink Glucerna supplemental protein drinks to try and maintain her weight.  I sent prescription for Compazine 10 mg p.o. every 6 hours as needed for nausea and vomiting to the patient's pharmacy.  I will reach out to radiation oncology to let them know of the plan so they could coordinate her appointments for radiation.  We will see her back for follow-up visit in 3 weeks for evaluation repeat blood work before starting cycle #2.  She has already been in contact with Darl Pikes from social work.  If the patient is interested in transportation, she will reach out to social work.  The patient voices understanding of current  disease status and treatment options and is in agreement with the current care plan.  All questions were answered. The patient knows to call the clinic with any problems, questions or concerns. We can certainly see the patient much sooner if necessary.  Thank you so much for allowing me to participate in the care of Lindsey Williamson. I will continue to follow up the patient with you and assist in her care.   Disclaimer: This note was dictated with voice recognition software. Similar sounding words can inadvertently be transcribed and may not be corrected upon review.   Saya Mccoll L Donaldo Teegarden October 25, 2023, 2:23 PM  ADDENDUM: Hematology/Oncology Attending:  I had a face-to-face encounter with the patient today.  I reviewed her records, lab, scan  and recommended her care plan.  This is a very pleasant 72 years old African-American female with multiple medical condition including history of hypertension, type 2 diabetes mellitus, trigeminal neuralgia, osteoarthritis, dyslipidemia, diverticular disease of the colon as well as hemorrhoids.  She also has a history of stage Ia non-small cell lung cancer, adenocarcinoma of the right upper lobe diagnosed in April 2023 status post SBRT under the care of Dr. Kathrynn Running.  The patient was followed by observation and recent imaging studies especially in March 2024 showed stable to borderline right hilar lymphadenopathy and few perifissural subpleural nodules that was felt at that time to be reactive in nature.  Repeat CT scan of the chest on 09/23/2023 showed right lower lobe postradiation changes with new nodular consolidation measuring 3.6 x 2.5 cm with increased size of subpleural nodules of the right hemithorax.  There was also a right lower lobe nodule measuring 2.0 x 1.5 cm increased from 0.7 x 0.5 cm there was also subpleural nodule of the right upper lobe measuring 0.6 cm.  The scan also showed increased size of mediastinal and right hilar lymph nodes  with a reference right hilar lymph node measuring 0.7 cm and a subcarinal lymph node measuring 2.1 cm.  The patient had a PET scan on October 10, 2023 and that showed recurrent tumor of the right lung base with associated right lower lobe metastasis in addition to right hilar and subcarinal nodal metastasis but no evidence of metastatic disease in the abdomen or pelvis.  On October 17, 2023 the patient underwent video bronchoscopy with robotic assisted bronchoscopic navigation and endobronchial ultrasound procedure under the care of Dr. Delton Coombes.  The final pathology from the fine-needle aspiration of station 7 lymph node showed malignant cells consistent with a squamous cell carcinoma.  Fine-needle aspiration of 4L showed rare atypical cells.  The endobronchial brushing and biopsy of the right lower lobe mass also showed squamous cell carcinoma.  MRI of the brain on October 19, 2023 showed no evidence of metastatic disease to the brain. The patient was referred to me today for evaluation and recommendation regarding treatment of her condition. When seen today the patient is feeling fine except for the fatigue and shortness of breath. This is a very pleasant 72 years old African-American female with recurrent non-small cell lung cancer presented as stage IIIb (T3, N2, M0) non-small cell lung cancer, squamous cell carcinoma diagnosed in October 2024 and presented with right lower lobe lung mass in addition to other pulmonary nodules involving the right lower lobe and right upper lobe as well as right hilar and mediastinal lymphadenopathy.  We will send her tissue biopsy for PD-L1 expression. I had a lengthy discussion with the patient and her husband about her current condition and treatment options.  I recommended for the patient a course of concurrent chemoradiation with weekly carboplatin for AUC of 2 and paclitaxel 45 Mg/M2 for 6-7 weeks followed by consolidation treatment with immunotherapy with Imfinzi if she  has no evidence for disease progression after the induction phase. I discussed with the patient the adverse effect of this treatment including but not limited to alopecia, myelosuppression, nausea and vomiting, peripheral neuropathy, liver or renal dysfunction. She is expected to start the first cycle of this treatment in around 2 weeks. The patient will have a chemotherapy education class before the first dose of her treatment. Will call her pharmacy with prescription for Compazine 10 mg p.o. every 6 hours as needed for nausea.  We may also consider  The patient for Port-A-Cath placement for the IV access. She will come back for follow-up visit with the start of her treatment. The patient was advised to call immediately if she has any other concerning symptoms in the interval. The total time spent in the appointment was 90 minutes. Disclaimer: This note was dictated with voice recognition software. Similar sounding words can inadvertently be transcribed and may be missed upon review. Lajuana Matte, MD

## 2023-10-24 ENCOUNTER — Inpatient Hospital Stay: Payer: Medicare Other | Attending: Internal Medicine | Admitting: Licensed Clinical Social Worker

## 2023-10-24 ENCOUNTER — Telehealth: Payer: Self-pay

## 2023-10-24 DIAGNOSIS — F17210 Nicotine dependence, cigarettes, uncomplicated: Secondary | ICD-10-CM | POA: Insufficient documentation

## 2023-10-24 DIAGNOSIS — Z923 Personal history of irradiation: Secondary | ICD-10-CM | POA: Insufficient documentation

## 2023-10-24 DIAGNOSIS — C7801 Secondary malignant neoplasm of right lung: Secondary | ICD-10-CM | POA: Insufficient documentation

## 2023-10-24 DIAGNOSIS — Z809 Family history of malignant neoplasm, unspecified: Secondary | ICD-10-CM | POA: Insufficient documentation

## 2023-10-24 DIAGNOSIS — C3431 Malignant neoplasm of lower lobe, right bronchus or lung: Secondary | ICD-10-CM | POA: Insufficient documentation

## 2023-10-24 NOTE — Telephone Encounter (Signed)
Patient identity verified x2. I called to follow-up on patient's report of fatigue, and very mild, occasional hematopoiesis.  Patient states "All issues above have completely resolved themselves and that she is doing / feeling much better. 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  I told patient I would pass this good news along to her care team her in the Rad/Onc dept at [facility]. I left my extension [phone] in case patient needs anything. Patient verbalized understanding.  This concludes the interaction.  Ruel Favors, LPN

## 2023-10-24 NOTE — Progress Notes (Signed)
[facility] CSW Progress Note  Visual merchandiser  contacted pt by phone.  Pt previously underwent radiation treatment for stage I lung cancer and has recently been found to have a recurrence.  Pt will have her consultation w/ Dr. Arbutus Ped 10/29 to discuss the treatment plan.  Pt continues to live on a fixed income and has financial concerns.  Pt does not at this time receive any governmental monetary assistance.  CSW to leave a food bag at reception for pt to take home following her appointment 10/29.  Contact information for CSW given to pt to reach out should needs arise while undergoing treatment.        Rachel Moulds, LCSW Clinical Social Worker [facility]

## 2023-10-25 ENCOUNTER — Ambulatory Visit (INDEPENDENT_AMBULATORY_CARE_PROVIDER_SITE_OTHER): Payer: Medicare Other | Admitting: Acute Care

## 2023-10-25 ENCOUNTER — Inpatient Hospital Stay: Payer: Medicare Other

## 2023-10-25 ENCOUNTER — Encounter: Payer: Self-pay | Admitting: Internal Medicine

## 2023-10-25 ENCOUNTER — Other Ambulatory Visit: Payer: Self-pay

## 2023-10-25 ENCOUNTER — Inpatient Hospital Stay (HOSPITAL_BASED_OUTPATIENT_CLINIC_OR_DEPARTMENT_OTHER): Payer: Medicare Other | Admitting: Physician Assistant

## 2023-10-25 ENCOUNTER — Encounter: Payer: Self-pay | Admitting: Acute Care

## 2023-10-25 VITALS — BP 126/70 | HR 94 | Ht 64.0 in | Wt 154.2 lb

## 2023-10-25 VITALS — BP 148/70 | HR 86 | Temp 98.3°F | Resp 16 | Wt 154.7 lb

## 2023-10-25 DIAGNOSIS — Z923 Personal history of irradiation: Secondary | ICD-10-CM | POA: Diagnosis not present

## 2023-10-25 DIAGNOSIS — Z7189 Other specified counseling: Secondary | ICD-10-CM | POA: Diagnosis not present

## 2023-10-25 DIAGNOSIS — Z9889 Other specified postprocedural states: Secondary | ICD-10-CM | POA: Diagnosis not present

## 2023-10-25 DIAGNOSIS — C7801 Secondary malignant neoplasm of right lung: Secondary | ICD-10-CM | POA: Diagnosis not present

## 2023-10-25 DIAGNOSIS — F17210 Nicotine dependence, cigarettes, uncomplicated: Secondary | ICD-10-CM | POA: Diagnosis not present

## 2023-10-25 DIAGNOSIS — C3431 Malignant neoplasm of lower lobe, right bronchus or lung: Secondary | ICD-10-CM | POA: Diagnosis present

## 2023-10-25 DIAGNOSIS — Z87891 Personal history of nicotine dependence: Secondary | ICD-10-CM

## 2023-10-25 DIAGNOSIS — Z809 Family history of malignant neoplasm, unspecified: Secondary | ICD-10-CM | POA: Diagnosis not present

## 2023-10-25 DIAGNOSIS — C3490 Malignant neoplasm of unspecified part of unspecified bronchus or lung: Secondary | ICD-10-CM | POA: Diagnosis not present

## 2023-10-25 DIAGNOSIS — T783XXA Angioneurotic edema, initial encounter: Secondary | ICD-10-CM

## 2023-10-25 LAB — CMP (CANCER CENTER ONLY)
ALT: <5 U/L (ref 0–44)
AST: 9 U/L — ABNORMAL LOW (ref 15–41)
Albumin: 3.6 g/dL (ref 3.5–5.0)
Alkaline Phosphatase: 59 U/L (ref 38–126)
Anion gap: 9 (ref 5–15)
BUN: 16 mg/dL (ref 8–23)
CO2: 24 mmol/L (ref 22–32)
Calcium: 9.8 mg/dL (ref 8.9–10.3)
Chloride: 103 mmol/L (ref 98–111)
Creatinine: 0.77 mg/dL (ref 0.44–1.00)
GFR, Estimated: >60 mL/min (ref >60)
Glucose, Bld: 187 mg/dL — ABNORMAL HIGH (ref 70–99)
Potassium: 4.3 mmol/L (ref 3.5–5.1)
Sodium: 136 mmol/L (ref 135–145)
Total Bilirubin: 0.3 mg/dL (ref 0.3–1.2)
Total Protein: 8 g/dL (ref 6.5–8.1)

## 2023-10-25 LAB — CBC WITH DIFFERENTIAL (CANCER CENTER ONLY)
Abs Immature Granulocytes: 0.03 10*3/uL (ref 0.00–0.07)
Basophils Absolute: 0.1 10*3/uL (ref 0.0–0.1)
Basophils Relative: 1 %
Eosinophils Absolute: 0.1 10*3/uL (ref 0.0–0.5)
Eosinophils Relative: 1 %
HCT: 35.4 % — ABNORMAL LOW (ref 36.0–46.0)
Hemoglobin: 11.4 g/dL — ABNORMAL LOW (ref 12.0–15.0)
Immature Granulocytes: 0 %
Lymphocytes Relative: 19 %
Lymphs Abs: 1.6 10*3/uL (ref 0.7–4.0)
MCH: 26.7 pg (ref 26.0–34.0)
MCHC: 32.2 g/dL (ref 30.0–36.0)
MCV: 82.9 fL (ref 80.0–100.0)
Monocytes Absolute: 0.8 10*3/uL (ref 0.1–1.0)
Monocytes Relative: 9 %
Neutro Abs: 5.7 10*3/uL (ref 1.7–7.7)
Neutrophils Relative %: 70 %
Platelet Count: 498 10*3/uL — ABNORMAL HIGH (ref 150–400)
RBC: 4.27 MIL/uL (ref 3.87–5.11)
RDW: 14.2 % (ref 11.5–15.5)
WBC Count: 8.3 10*3/uL (ref 4.0–10.5)
nRBC: 0 % (ref 0.0–0.2)

## 2023-10-25 LAB — COMPLEMENT COMPONENT C1Q: C1q Complement Protein CC1Q: 14.8 mg/dL (ref 10.3–20.5)

## 2023-10-25 MED ORDER — PROCHLORPERAZINE MALEATE 10 MG PO TABS: 10.0000 mg | ORAL_TABLET | Freq: Four times a day (QID) | ORAL | 2 refills | Status: DC | PRN

## 2023-10-25 NOTE — Patient Instructions (Addendum)
It is good to see you today. I am so sorry you had such a hard time after your bronchoscopy. I am glad you had good care in the emergency room. 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 You are already established with Dr. Arbutus Ped , and you have appointments set up with radiation oncology for  concurrent chemotherapy and radiation oncology. For allergies, try your Astelin nasal spray and Zyrtec. Call if you notice any change in your secretions.  You can add Delsym for cough every 12 hours as needed. Call us if you need [place] for anything. You will get great care at the [facility]. Good luck with your treatment.  Please contact office for sooner follow up if symptoms do not improve or worsen or seek emergency care

## 2023-10-25 NOTE — Patient Instructions (Addendum)
-  There are two main categories of lung cancer, they are named based on the size of the cancer cell. One is called Non-Small cell lung cancer. The other type is Small Cell Lung Cancer -The sample (biopsy) that they took of your tumor was consistent with a subtype of Non-small cell lung cancer called squamous cell radiation.  -We covered a lot of important information at your appointment today regarding what the treatment plan is moving forward. Here are the the main points that were discussed at your office visit with [place] today:  -The treatment that you will receive consists of two chemotherapy drugs called Carboplatin and Paclitaxel (also called Taxol) -We are planning on starting your treatment next week on 11/07/23 but before your start your treatment, I would like you to attend a Chemotherapy Education Class. This involves having you sit down with one of our nurse educators. She will discuss with you one-on-one more details about your treatment as well as general information about resources here at the [facility].  -Your treatment will be given every week for about 6 weeks or so (as long as you are also receiving radiation). We will check your labs (blood work) once a week . Dr. Arbutus Ped or I will see you every other treatment just to make sure that you are doing well and that everything is on track. -We will get a CT scan about 3 weeks after you complete your treatment  Medications:  -Compazine was sent to your pharmacy. This medication is for nausea. You may take this every 6 hours as needed if you feel nausous.   Referrals -I will place the referral to radiation oncology to meet with you to discuss starting radiation treatment. Please be on the lookout for a phone call from them to schedule a consultation.   Follow Up:  -We will see you back for a follow up visit in 2-3 weeks with the second week of treatment

## 2023-10-25 NOTE — Progress Notes (Signed)
START OFF PATHWAY REGIMEN - Non-Small Cell Lung   [id number]:Carboplatin AUC=2 IV D1 + Paclitaxel 45 mg/m2 IV D1 q7 Days + RT:   A cycle is every 7 days, concurrent with RT:     Paclitaxel      Carboplatin   **Always confirm dose/schedule in your pharmacy ordering system**  Patient Characteristics: Preoperative or Nonsurgical Candidate (Clinical Staging), Stage III - Nonsurgical Candidate (Nonsquamous and Squamous), PS = 0,1, EGFR Mutation - Common (Exon 19 Deletion or Exon 21 L858R Substitution) Status Unknown Therapeutic Status: Preoperative or Nonsurgical Candidate (Clinical Staging) AJCC T Category: cT3 AJCC N Category: cN2 AJCC M Category: cM0 AJCC 8 Stage Grouping: IIIB ECOG Performance Status: 1 EGFR Mutation - Common (Exon 19 Deletion or Exon 21 L858R Substitution): Did Not Order Test Intent of Therapy: Curative Intent, Discussed with Patient

## 2023-10-25 NOTE — Progress Notes (Signed)
History of Present Illness Lindsey Williamson is a 72 y.o. female former smoker, quit in 2023 with a 20+ pack year smoking history referred to see Dr. Tonia Brooms after an abnormal lung cancer screening scan done at [facility] in March 2023.    Synopsis 72 year old female recently had a abnormal lung cancer screening CT completed at [facility]. Patient's past medical history includes diabetes and hypertension.Patient's lung cancer screening CT was completed on 02/23/2022 which was read as a lung RADS 4B. Patient was found to have a 23 x 17 mm spiculated right lower lobe lung nodule as well as a left upper lobe 8 mm x 4.5 mm solid lung nodule both were concerned for malignancy.  Patient was seen by Dr. Tonia Brooms on March 22, 2022 after  PET scan. PET scan imaging revealed hypermetabolic uptake within the right lower lobe nodule. Additionally there was a smaller focus of metabolic uptake on the medial portion of the right lower lobe concerning for inflammatory versus potential metastatic disease. There was no obvious nodule associated with that area and its approximately 4 mm in size if there was anything there I reviewed the images on the CT as well as the recent super D.  She underwent flexible video fiberoptic bronchoscopy  with robotic assistance and biopsies on March 30, 2022.  Cytology resulted in malignant cells consistent with non-small cell carcinoma.  Patient was referred to Dr. Kathrynn Running to discuss radiation therapy 04/08/2022.  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  She decided to proceed with stereotactic body radiotherapy (SBRT) and completed 3 fractions to the RLL nodule in May 2023.  She underwent posttreatment surveillance scans every 6 months and had shown excellent response to treatment up to March 2024.  At that time her CT chest scan from 03/17/2023 showed stable borderline right hilar lymphadenopathy and a few perifissural subpleural nodes that  were felt to be reactive but no new nodes or evidence of disease progression or recurrence.  However on follow-up CT chest 09/16/2023 there were findings concerning for recurrence of disease with the hilar and mediastinal nodal involvement.  This was further evaluated with PET scan on 10/10/2023 which was positive for focal hypermetabolism in the right lower lung lobe suggesting recurrent disease as well as right hilar and subcarinal nodal metastasis.  There was no distant metastatic static disease into the abdomen or pelvis.  Patient was referred back to Dr. Delton Coombes for bronchoscopy for tissue confirmation and molecular testing to determine options for status systemic therapy.  Patient underwent Flexible video fiberoptic bronchoscopy with robotic assistance and biopsies on 10/17/2023. Brain MRI was also ordered at that time to complete her disease staging which at present appears to be stage IIIa non-small cell lung cancer.  Once cytology results from bronchoscopy are obtained she will be referred to medical oncology to discuss treatment options.  Patient is here today to go over the results of her most recent cytology results, and to see how she did postprocedure.   10/25/2023 Pt. Presents for follow up after Flexible video fiberoptic bronchoscopy with robotic assistance and biopsies on 10/17/2023. She did have some tongue swelling  that affected her speech after the procedure.. No lip swelling or difficulty swallowing. No cough or wheezing. No hemoptysis. She was advised to go to the ER to be evaluated, and to call 911 if she had any trouble breathing. This has never happened to her in the past. She had taken her  lisinopril after the procedure once she got home , and per the ED note they feel this was the cause of her angioedema. She was discharged the next day. She has this listed as an allergy in EPIC, and she knows to never take this again. She denies fever, she had some scant bloody secretions for a few days  after . But this has self resolved. No adverse reactions to anesthesia.   We discussed her cytology results.  We discussed that her biopsy of the right lower lobe was positive for a squamous cell carcinoma.  I explained that this appears to be metastasis of her earlier lung cancer.  Patient verbalized understanding and she understands she will be referred back to medical oncology and Dr. Arbutus Ped for treatment options.  This is obviously not the new she had hoped to here today.  She is here with her husband who is a great support.  Both she and her husband had multiple questions regarding treatment plans, I have told them that Dr. Shirline Frees will review all options with her as it is his expertise.  They verbalized understanding and had no further questions at completion of the visit.  Patient has office visits with radiation oncology October 28, 2023 for consultation and discussion regarding plan of care.  Test Results: Cytology A. LUNG, RLL MASS, FINE NEEDLE ASPIRATION:   - Atypical cells present, see comment  There are only rare mildly atypical cells not diagnostic of  malignancy in my opinion.   B. LUNG, RLL MASS, BRUSHING:  - No malignant cells identified  - Benign bronchial cells and blood present    C. LUNG, RLL ENDOBRONCHIAL, BRUSHING:  - Squamous cell carcinoma   D. LUNG, RLL ENDOBRONCHIAL, BIOPSY:  - Squamous cell carcinoma, see comment    E. LYMPH NODE, 4L, FINE NEEDLE ASPIRATION:  - Rare atypical cells   F. LYMPH NODE, 7, FINE NEEDLE ASPIRATION:  - Malignant cells present  - Squamous cell carcinoma   G. LYMPH NODE, 11R, FINE NEEDLE ASPIRATION:  - No malignant cells identified      Latest Ref Rng & Units 10/25/2023    1:13 PM 10/17/2023    8:19 PM 10/17/2023   10:59 AM  CBC  WBC 4.0 - 10.5 K/uL 8.3  7.9  7.6   Hemoglobin 12.0 - 15.0 g/dL 16.1  09.6  04.5   Hematocrit 36.0 - 46.0 % 35.4  38.8  37.2   Platelets 150 - 400 K/uL 498  519  476        Latest Ref Rng &  Units 10/25/2023    1:13 PM 10/17/2023    8:19 PM 10/17/2023   10:59 AM  BMP  Glucose 70 - 99 mg/dL 409  811  914   BUN 8 - 23 mg/dL 16  16  11    Creatinine 0.44 - 1.00 mg/dL 7.82  9.56  2.13   Sodium 135 - 145 mmol/L 136  134  135   Potassium 3.5 - 5.1 mmol/L 4.3  4.2  3.5   Chloride 98 - 111 mmol/L 103  100  103   CO2 22 - 32 mmol/L 24  22  23    Calcium 8.9 - 10.3 mg/dL 9.8  8.9  8.9     BNP No results found for: "BNP"  ProBNP No results found for: "PROBNP"  PFT No results found for: "FEV1PRE", "FEV1POST", "FVCPRE", "FVCPOST", "TLC", "DLCOUNC", "PREFEV1FVCRT", "PSTFEV1FVCRT"  MR Brain W Wo Contrast  Result Date: 10/19/2023 CLINICAL DATA:  Provided  history: Non-small cell lung cancer, staging. Primary cancer of right lower lobe of lung. EXAM: MRI HEAD WITHOUT AND WITH CONTRAST TECHNIQUE: Multiplanar, multiecho pulse sequences of the brain and surrounding structures were obtained without and with intravenous contrast. CONTRAST:  7mL GADAVIST GADOBUTROL 1 MMOL/ML IV SOLN COMPARISON:  PET CT 10/10/2023. FINDINGS: The sagittal T1-weighted post-contrast sequence is moderately motion degraded. Within this limitation, findings are as follows. Brain: Mild generalized cerebral atrophy. Multifocal T2 FLAIR hyperintense signal abnormality within the cerebral white matter, nonspecific but compatible with moderate chronic small vessel ischemic disease. There is no acute infarct. No evidence of an intracranial mass. No chronic intracranial blood products. No extra-axial fluid collection. No midline shift. No pathologic intracranial enhancement identified. Vascular: Maintained flow voids within the proximal large arterial vessels. Small left parietal lobe developmental venous anomaly (anatomic variant). Skull and upper cervical spine: No focal worrisome marrow lesion. Incompletely assessed cervical spondylosis. Sinuses/Orbits: No mass or acute finding within the imaged orbits. Prior bilateral ocular  lens replacement. Minimal mucosal thickening within the left maxillary sinus. Other: Trace fluid within the left mastoid air cells. IMPRESSION: 1. Intermittently motion degraded examination as described. 2. No evidence of intracranial metastatic disease. 3. Moderate chronic small vessel ischemic changes within the cerebral white matter. 4. Mild generalized cerebral atrophy. 5. Minor left maxillary sinus mucosal thickening. Electronically Signed   By: Jackey Loge D.O.   On: 10/19/2023 20:52   DG Chest Port 1 View  Result Date: 10/17/2023 CLINICAL DATA:  Post bronchoscopy with biopsy EXAM: PORTABLE CHEST 1 VIEW COMPARISON:  CT 09/23/2023 FINDINGS: Diffuse reticular opacity, corresponding to emphysema and chronic lung disease. Opacity at the right base corresponding to area of concern on prior CT imaging, fiducial marker in the region. No pleural effusions. No convincing pneumothorax. IMPRESSION: 1. No convincing pneumothorax. 2. Emphysema and chronic lung disease. Right base opacity corresponding to radiation change and masslike consolidation on prior CT imaging. Electronically Signed   By: Jasmine Pang M.D.   On: 10/17/2023 18:46   DG C-ARM BRONCHOSCOPY  Result Date: 10/17/2023 C-ARM BRONCHOSCOPY: Fluoroscopy was utilized by the requesting physician.  No radiographic interpretation.   NM PET Image Restag (PS) Skull Base To Thigh  Result Date: 10/12/2023 CLINICAL DATA:  Subsequent treatment strategy for non-small cell lung cancer. EXAM: NUCLEAR MEDICINE PET SKULL BASE TO THIGH TECHNIQUE: 7.8 mCi F-18 FDG was injected intravenously. Full-ring PET imaging was performed from the skull base to thigh after the radiotracer. CT data was obtained and used for attenuation correction and anatomic localization. Fasting blood glucose: 167 mg/dl COMPARISON:  CT chest dated 09/23/2023 FINDINGS: Mediastinal blood pool activity: SUV max 2.7 Liver activity: SUV max NA NECK: No hypermetabolic lymph nodes in the neck.  Incidental CT findings: None. CHEST: 5.9 x 3.0 cm aggregate masslike opacity at the right lung base (series 7/image 43), reflecting radiation changes, but with associated focal hypermetabolism (max SUV 8.7), suggesting recurrence. 3.6 x 2.2 cm mass in the medial right lower lobe (series 7/image 31), max SUV 17.6, compatible with metastatic disease. Adjacent 2.4 cm subcarinal/right azygoesophageal recess node (series 4/image 70), max SUV 18.6. Associated right hilar node, poorly evaluated but measuring approximately 14 mm short axis (series 4/image 71), max SUV 17.5. Incidental CT findings: Mild centrilobular and paraseptal emphysematous changes. Atherosclerotic calcifications of the aortic arch. Moderate three-vessel coronary atherosclerosis. ABDOMEN/PELVIS: No abnormal hypermetabolic activity within the liver, pancreas, adrenal glands, or spleen. No hypermetabolic lymph nodes in the abdomen or pelvis. Incidental CT findings: Layering small  gallstones. Left colonic diverticulosis, without evidence of diverticulitis. Uterine fibroids. Atherosclerotic calcifications of the abdominal aorta and branch vessels. SKELETON: No focal hypermetabolic activity to suggest skeletal metastasis. Incidental CT findings: Mild degenerative changes of the lumbar spine. IMPRESSION: Recurrent tumor at the right lung base, as described above. Associated right lower lobe metastasis. Right hilar and subcarinal nodal metastases, as above. No evidence of metastatic disease in the abdomen/pelvis. Electronically Signed   By: Charline Bills M.D.   On: 10/12/2023 11:22     Past medical hx Past Medical History:  Diagnosis Date   Angioedema 10/17/2023   Arthritis    Diabetes mellitus without complication (HCC)    Hypertension      Social History   Tobacco Use   Smoking status: Former    Current packs/day: 0.00    Types: Cigarettes    Quit date: 03/26/2022    Years since quitting: 1.5    Passive exposure: Never   Smokeless  tobacco: Former  Building services engineer status: Never Used  Substance Use Topics   Alcohol use: No   Drug use: Never    Ms.Zweber reports that she quit smoking about 19 months ago. Her smoking use included cigarettes. She has never been exposed to tobacco smoke. She has quit using smokeless tobacco. She reports that she does not drink alcohol and does not use drugs.  Tobacco Cessation: Former smoker, quit March 26, 2022 Patient had a 20+ pack year smoking history   Past surgical hx, Family hx, Social hx all reviewed.  Current Outpatient Medications on File Prior to Visit  Medication Sig   amLODipine (NORVASC) 5 MG tablet Take 5 mg by mouth daily.   aspirin EC 81 MG tablet Take 81 mg by mouth daily. Swallow whole.   azelastine (ASTELIN) 0.1 % nasal spray Place 2 sprays into both nostrils 2 (two) times daily as needed for rhinitis.   cetirizine (ZYRTEC ALLERGY) 10 MG tablet Take 1 tablet (10 mg total) by mouth daily.   diclofenac Sodium (VOLTAREN) 1 % GEL Apply 1 application  topically 2 (two) times daily as needed (Pain).   empagliflozin (JARDIANCE) 25 MG TABS tablet Take 25 mg by mouth daily.   famotidine (PEPCID) 20 MG tablet Take 1 tablet (20 mg total) by mouth 2 (two) times daily.   metFORMIN (GLUCOPHAGE-XR) 500 MG 24 hr tablet Take 1,000 mg by mouth 2 (two) times daily.   pravastatin (PRAVACHOL) 80 MG tablet Take 80 mg by mouth daily.   No current facility-administered medications on file prior to visit.     Allergies  Allergen Reactions   Benzonatate Palpitations   Lisinopril Swelling and Other (See Comments)    Patient ended up in the ED with a swollen mouth and tongue   Aspirin Palpitations and Nausea And Vomiting    Can tolerate baby aspirin without difficulty   Azithromycin Palpitations   Codeine Palpitations    Review Of Systems:  Constitutional:   No  weight loss, night sweats,  Fevers, chills, fatigue, or  lassitude.  HEENT:   No headaches,  Difficulty  swallowing,  Tooth/dental problems, or  Sore throat,                No sneezing, itching, ear ache, nasal congestion, post nasal drip, Pt. Had a n  episode of angioedema. Tongue has returned to its normal size, she has no issues with swallow.  CV:  No chest pain,  Orthopnea, PND, swelling in lower extremities, anasarca, dizziness, palpitations,  syncope.   GI  No heartburn, indigestion, abdominal pain, nausea, vomiting, diarrhea, change in bowel habits, loss of appetite, bloody stools.   Resp: No shortness of breath with exertion or at rest.  No excess mucus, no productive cough,  No non-productive cough,  No coughing up of blood.  No change in color of mucus.  No wheezing.  No chest wall deformity  Skin: no rash or lesions.  GU: no dysuria, change in color of urine, no urgency or frequency.  No flank pain, no hematuria   MS:  No joint pain or swelling.  No decreased range of motion.  No back pain.  Psych:  No change in mood or affect. No depression or anxiety.  No memory loss.   Vital Signs BP 126/70 (BP Location: Right Arm, Cuff Size: Normal)   Pulse 94   Ht 5\' 4"  (1.626 m)   Wt 154 lb 3.2 oz (69.9 kg)   SpO2 97%   BMI 26.47 kg/m    Physical Exam:  General- No distress,  A&Ox3, pleasant ENT: No sinus tenderness, TM clear, pale nasal mucosa, no oral exudate,no post nasal drip, no LAN Cardiac: S1, S2, regular rate and rhythm, no murmur Chest: No wheeze/ rales/ dullness; no accessory muscle use, no nasal flaring, no sternal retractions, slightly diminished per bases Abd.: Soft Non-tender, nondistended, bowel sounds positive,Body mass index is 26.47 kg/m.  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 I am glad you had good care in the emergency room. 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 You are already established with Dr. Arbutus Ped , and you have appointments set up with radiation oncology for  concurrent chemotherapy and radiation oncology. For allergies, try your Astelin nasal spray and Zyrtec. Call if you notice any change in your secretions.  You can add Delsym for cough every 12 hours as needed. Call us if you need [place] for anything. You will get great care at the [facility]. Good luck with your treatment.  Please contact office for sooner follow up if symptoms do not improve or worsen or seek emergency care     I spent 35 minutes dedicated to the care of this patient on the date of this encounter to include pre-visit review of records, face-to-face time with the patient discussing conditions above, post visit ordering of testing, clinical documentation with the electronic health record, making appropriate referrals as documented, and communicating necessary information to the patient's healthcare team.    Bevelyn Ngo, NP 10/25/2023  3:54 PM

## 2023-10-27 ENCOUNTER — Other Ambulatory Visit: Payer: Self-pay

## 2023-10-27 NOTE — Progress Notes (Signed)
I reached out to the pt today as I was unable to attend her new pt consult on 10/29. No answer at mobile number. I left a VM with my direct contact information and asked the pt to call back with any questions or concerns about her upcoming treatments.

## 2023-10-28 ENCOUNTER — Inpatient Hospital Stay: Payer: Medicare Other

## 2023-10-28 ENCOUNTER — Encounter: Payer: Self-pay | Admitting: Acute Care

## 2023-10-28 ENCOUNTER — Encounter: Payer: Self-pay | Admitting: Internal Medicine

## 2023-10-28 ENCOUNTER — Ambulatory Visit
Admission: RE | Admit: 2023-10-28 | Discharge: 2023-10-28 | Disposition: A | Discharge status: Home / Self Care | Payer: Medicare Other | Source: Ambulatory Visit | Attending: Radiation Oncology | Admitting: Radiation Oncology

## 2023-10-28 DIAGNOSIS — C7801 Secondary malignant neoplasm of right lung: Secondary | ICD-10-CM | POA: Insufficient documentation

## 2023-10-28 DIAGNOSIS — K30 Functional dyspepsia: Secondary | ICD-10-CM | POA: Insufficient documentation

## 2023-10-28 DIAGNOSIS — Z923 Personal history of irradiation: Secondary | ICD-10-CM | POA: Insufficient documentation

## 2023-10-28 DIAGNOSIS — F17210 Nicotine dependence, cigarettes, uncomplicated: Secondary | ICD-10-CM | POA: Insufficient documentation

## 2023-10-28 DIAGNOSIS — Z5111 Encounter for antineoplastic chemotherapy: Secondary | ICD-10-CM | POA: Insufficient documentation

## 2023-10-28 DIAGNOSIS — Z809 Family history of malignant neoplasm, unspecified: Secondary | ICD-10-CM | POA: Insufficient documentation

## 2023-10-28 DIAGNOSIS — C3431 Malignant neoplasm of lower lobe, right bronchus or lung: Secondary | ICD-10-CM | POA: Insufficient documentation

## 2023-10-28 DIAGNOSIS — E1165 Type 2 diabetes mellitus with hyperglycemia: Secondary | ICD-10-CM | POA: Insufficient documentation

## 2023-10-28 DIAGNOSIS — R141 Gas pain: Secondary | ICD-10-CM | POA: Insufficient documentation

## 2023-10-28 DIAGNOSIS — R059 Cough, unspecified: Secondary | ICD-10-CM | POA: Insufficient documentation

## 2023-10-28 DIAGNOSIS — Z510 Encounter for antineoplastic radiation therapy: Secondary | ICD-10-CM | POA: Insufficient documentation

## 2023-10-28 NOTE — Progress Notes (Signed)
The proposed treatment discussed in conference is for discussion purpose only and is not a binding recommendation.  The patients have not been physically examined, or presented with their treatment options.  Therefore, final treatment plans cannot be decided.  

## 2023-10-31 NOTE — Progress Notes (Signed)
Pharmacist Chemotherapy Monitoring - Initial Assessment    Anticipated start date: 11/07/23   The following has been reviewed per standard work regarding the patient's treatment regimen: The patient's diagnosis, treatment plan and drug doses, and organ/hematologic function Lab orders and baseline tests specific to treatment regimen  The treatment plan start date, drug sequencing, and pre-medications Prior authorization status  Patient's documented medication list, including drug-drug interaction screen and prescriptions for anti-emetics and supportive care specific to the treatment regimen The drug concentrations, fluid compatibility, administration routes, and timing of the medications to be used The patient's access for treatment and lifetime cumulative dose history, if applicable  The patient's medication allergies and previous infusion related reactions, if applicable   Changes made to treatment plan:  N/A  Follow up needed:  N/A   Lindsey Williamson, RPH, 10/31/2023  2:46 PM

## 2023-10-31 NOTE — Progress Notes (Signed)
  Radiation Oncology         [phone] ________________________________  Name: ALANNY RIVERS MRN: [id number]  Date: 10/28/2023  DOB: Jan 26, 1951  SIMULATION AND TREATMENT PLANNING NOTE    ICD-10-CM   1. 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  Identity was confirmed.  All relevant records and images related to the planned course of therapy were reviewed.  The patient freely provided informed written consent to proceed with treatment after reviewing the details related to the planned course of therapy. The consent form was witnessed and verified by the simulation staff.  Then, the patient was set-up in a stable reproducible  supine position for radiation therapy.  CT images were obtained.  Surface markings were placed.  The CT images were loaded into the planning software.  Then the target and avoidance structures were contoured.  Treatment planning then occurred.  The radiation prescription was entered and confirmed.  Then, I designed and supervised the construction of a total of 6 medically necessary complex treatment devices, including a BodyFix immobilization mold custom fitted to the patient along with 5 multileaf collimators conformally shaped radiation around the treatment target while shielding critical structures such as the heart and spinal cord maximally.  I have requested : 3D Simulation  I have requested a DVH of the following structures: Left lung, right lung, spinal cord, heart, esophagus, and target.  I have ordered:Nutrition Consult  SPECIAL TREATMENT PROCEDURE:  The planned course of therapy using radiation constitutes a special treatment procedure. Special care is required in the management of this patient for the following reasons.  The patient will be receiving concurrent chemotherapy requiring  careful monitoring for increased toxicities of treatment including periodic laboratory values.  The special nature of the planned course of radiotherapy will require increased physician supervision and oversight to ensure patient's safety with optimal treatment outcomes.  PLAN:  The patient will receive 66 Gy in 33 fractions.  ________________________________  Artist Pais Kathrynn Running, M.D.

## 2023-11-01 ENCOUNTER — Inpatient Hospital Stay: Payer: Medicare Other | Admitting: Licensed Clinical Social Worker

## 2023-11-01 DIAGNOSIS — Z510 Encounter for antineoplastic radiation therapy: Secondary | ICD-10-CM | POA: Diagnosis not present

## 2023-11-01 DIAGNOSIS — C3431 Malignant neoplasm of lower lobe, right bronchus or lung: Secondary | ICD-10-CM

## 2023-11-01 NOTE — Progress Notes (Signed)
[facility] CSW Progress Note  Visual merchandiser  spoke w/ pt by phone.  Pt calling to inquire about transportation as she will be restarting chemo and radiation.  CSW sent a message to the transportation department on behalf of pt to assist w/ transportation.  CSW to remain available as appropriate throughout duration of treatment to provide support.        Rachel Moulds, LCSW Clinical Social Worker [facility]

## 2023-11-02 ENCOUNTER — Encounter (HOSPITAL_COMMUNITY): Payer: Self-pay

## 2023-11-07 ENCOUNTER — Ambulatory Visit
Admission: RE | Admit: 2023-11-07 | Discharge: 2023-11-07 | Disposition: A | Discharge status: Home / Self Care | Payer: Medicare Other | Source: Ambulatory Visit | Attending: Radiation Oncology

## 2023-11-07 ENCOUNTER — Inpatient Hospital Stay: Payer: Medicare Other

## 2023-11-07 ENCOUNTER — Other Ambulatory Visit: Payer: Self-pay

## 2023-11-07 VITALS — BP 148/72 | HR 85 | Temp 98.1°F | Resp 16

## 2023-11-07 DIAGNOSIS — Z510 Encounter for antineoplastic radiation therapy: Secondary | ICD-10-CM | POA: Diagnosis not present

## 2023-11-07 DIAGNOSIS — C3431 Malignant neoplasm of lower lobe, right bronchus or lung: Secondary | ICD-10-CM

## 2023-11-07 LAB — RAD ONC ARIA SESSION SUMMARY
Course Elapsed Days: 0
Plan Fractions Treated to Date: 1
Plan Prescribed Dose Per Fraction: 2 Gy
Plan Total Fractions Prescribed: 33
Plan Total Prescribed Dose: 66 Gy
Reference Point Dosage Given to Date: 2 Gy
Reference Point Session Dosage Given: 2 Gy
Session Number: 1

## 2023-11-07 LAB — CBC WITH DIFFERENTIAL (CANCER CENTER ONLY)
Abs Immature Granulocytes: 0.03 10*3/uL (ref 0.00–0.07)
Basophils Absolute: 0.1 10*3/uL (ref 0.0–0.1)
Basophils Relative: 1 %
Eosinophils Absolute: 0.1 10*3/uL (ref 0.0–0.5)
Eosinophils Relative: 1 %
HCT: 35.8 % — ABNORMAL LOW (ref 36.0–46.0)
Hemoglobin: 11.2 g/dL — ABNORMAL LOW (ref 12.0–15.0)
Immature Granulocytes: 0 %
Lymphocytes Relative: 18 %
Lymphs Abs: 1.7 10*3/uL (ref 0.7–4.0)
MCH: 25.4 pg — ABNORMAL LOW (ref 26.0–34.0)
MCHC: 31.3 g/dL (ref 30.0–36.0)
MCV: 81.2 fL (ref 80.0–100.0)
Monocytes Absolute: 0.7 10*3/uL (ref 0.1–1.0)
Monocytes Relative: 8 %
Neutro Abs: 6.6 10*3/uL (ref 1.7–7.7)
Neutrophils Relative %: 72 %
Platelet Count: 504 10*3/uL — ABNORMAL HIGH (ref 150–400)
RBC: 4.41 MIL/uL (ref 3.87–5.11)
RDW: 14.1 % (ref 11.5–15.5)
WBC Count: 9.3 10*3/uL (ref 4.0–10.5)
nRBC: 0 % (ref 0.0–0.2)

## 2023-11-07 LAB — CMP (CANCER CENTER ONLY)
ALT: <5 U/L (ref 0–44)
AST: 10 U/L — ABNORMAL LOW (ref 15–41)
Albumin: 3.5 g/dL (ref 3.5–5.0)
Alkaline Phosphatase: 68 U/L (ref 38–126)
Anion gap: 11 (ref 5–15)
BUN: 12 mg/dL (ref 8–23)
CO2: 22 mmol/L (ref 22–32)
Calcium: 9.4 mg/dL (ref 8.9–10.3)
Chloride: 103 mmol/L (ref 98–111)
Creatinine: 0.68 mg/dL (ref 0.44–1.00)
GFR, Estimated: >60 mL/min (ref >60)
Glucose, Bld: 208 mg/dL — ABNORMAL HIGH (ref 70–99)
Potassium: 3.6 mmol/L (ref 3.5–5.1)
Sodium: 136 mmol/L (ref 135–145)
Total Bilirubin: 0.3 mg/dL (ref <1.2)
Total Protein: 8.3 g/dL — ABNORMAL HIGH (ref 6.5–8.1)

## 2023-11-07 MED ORDER — FAMOTIDINE IN NACL 20-0.9 MG/50ML-% IV SOLN
20.0000 mg | Freq: Once | INTRAVENOUS | Status: AC
  Administered 2023-11-07: 20 mg via INTRAVENOUS
  Filled 2023-11-07: qty 50

## 2023-11-07 MED ORDER — DIPHENHYDRAMINE HCL 50 MG/ML IJ SOLN
50.0000 mg | Freq: Once | INTRAMUSCULAR | Status: AC
  Administered 2023-11-07: 50 mg via INTRAVENOUS
  Filled 2023-11-07: qty 1

## 2023-11-07 MED ORDER — CARBOPLATIN CHEMO INJECTION 450 MG/45ML
160.6000 mg | Freq: Once | INTRAVENOUS | Status: AC
  Administered 2023-11-07: 160 mg via INTRAVENOUS
  Filled 2023-11-07: qty 16

## 2023-11-07 MED ORDER — SODIUM CHLORIDE 0.9 % IV SOLN
INTRAVENOUS | Status: DC
Start: 2023-11-07 — End: 2023-11-07

## 2023-11-07 MED ORDER — SODIUM CHLORIDE 0.9 % IV SOLN
45.0000 mg/m2 | Freq: Once | INTRAVENOUS | Status: AC
  Administered 2023-11-07: 78 mg via INTRAVENOUS
  Filled 2023-11-07: qty 13

## 2023-11-07 MED ORDER — PALONOSETRON HCL INJECTION 0.25 MG/5ML
0.2500 mg | Freq: Once | INTRAVENOUS | Status: AC
  Administered 2023-11-07: 0.25 mg via INTRAVENOUS
  Filled 2023-11-07: qty 5

## 2023-11-07 MED ORDER — DEXAMETHASONE SODIUM PHOSPHATE 10 MG/ML IJ SOLN
10.0000 mg | Freq: Once | INTRAMUSCULAR | Status: AC
  Administered 2023-11-07: 10 mg via INTRAVENOUS
  Filled 2023-11-07: qty 1

## 2023-11-07 NOTE — Patient Instructions (Signed)
[facility] - A DEPT OF [facility]  Discharge Instructions: Thank you for choosing [facility] to provide your oncology and hematology care.   If you have a lab appointment with the Cancer Center, please go directly to the Cancer Center and check in at the registration area.   Wear comfortable clothing and clothing appropriate for easy access to any Portacath or PICC line.   We strive to give you quality time with your provider. You may need to reschedule your appointment if you arrive late (15 or more minutes).  Arriving late affects you and other patients whose appointments are after yours.  Also, if you miss three or more appointments without notifying the office, you may be dismissed from the clinic at the provider's discretion.      For prescription refill requests, have your pharmacy contact our office and allow 72 hours for refills to be completed.    Today you received the following chemotherapy and/or immunotherapy agents: paclitaxel and carboplatin      To help prevent nausea and vomiting after your treatment, we encourage you to take your nausea medication as directed.  BELOW ARE SYMPTOMS THAT SHOULD BE REPORTED IMMEDIATELY: *FEVER GREATER THAN 100.4 F (38 C) OR HIGHER *CHILLS OR SWEATING *NAUSEA AND VOMITING THAT IS NOT CONTROLLED WITH YOUR NAUSEA MEDICATION *UNUSUAL SHORTNESS OF BREATH *UNUSUAL BRUISING OR BLEEDING *URINARY PROBLEMS (pain or burning when urinating, or frequent urination) *BOWEL PROBLEMS (unusual diarrhea, constipation, pain near the anus) TENDERNESS IN MOUTH AND THROAT WITH OR WITHOUT PRESENCE OF ULCERS (sore throat, sores in mouth, or a toothache) UNUSUAL RASH, SWELLING OR PAIN  UNUSUAL VAGINAL DISCHARGE OR ITCHING   Items with * indicate a potential emergency and should be followed up as soon as possible or go to the Emergency Department if any problems should occur.  Please show the CHEMOTHERAPY ALERT CARD  or IMMUNOTHERAPY ALERT CARD at check-in to the Emergency Department and triage nurse.  Should you have questions after your visit or need to cancel or reschedule your appointment, please contact [facility] - A DEPT OF [facility]  Dept: [phone]  and follow the prompts.  Office hours are 8:00 a.m. to 4:30 p.m. Monday - Friday. Please note that voicemails left after 4:00 p.m. may not be returned until the following business day.  We are closed weekends and major holidays. You have access to a nurse at all times for urgent questions. Please call the main number to the clinic Dept: [phone] and follow the prompts.   For any non-urgent questions, you may also contact your provider using MyChart. We now offer e-Visits for anyone 41 and older to request care online for non-urgent symptoms. For details visit [email or web address].   Also download the MyChart app! Go to the app store, search "MyChart", open the app, select [place], and log in with your MyChart username and password.

## 2023-11-08 ENCOUNTER — Inpatient Hospital Stay: Payer: Medicare Other | Admitting: Licensed Clinical Social Worker

## 2023-11-08 ENCOUNTER — Inpatient Hospital Stay: Payer: Medicare Other

## 2023-11-08 ENCOUNTER — Other Ambulatory Visit: Payer: Self-pay

## 2023-11-08 ENCOUNTER — Ambulatory Visit
Admission: RE | Admit: 2023-11-08 | Discharge: 2023-11-08 | Disposition: A | Discharge status: Home / Self Care | Payer: Medicare Other | Source: Ambulatory Visit | Attending: Radiation Oncology

## 2023-11-08 ENCOUNTER — Telehealth: Payer: Self-pay

## 2023-11-08 DIAGNOSIS — C3431 Malignant neoplasm of lower lobe, right bronchus or lung: Secondary | ICD-10-CM

## 2023-11-08 DIAGNOSIS — Z510 Encounter for antineoplastic radiation therapy: Secondary | ICD-10-CM | POA: Diagnosis not present

## 2023-11-08 LAB — RAD ONC ARIA SESSION SUMMARY
Course Elapsed Days: 1
Plan Fractions Treated to Date: 2
Plan Prescribed Dose Per Fraction: 2 Gy
Plan Total Fractions Prescribed: 33
Plan Total Prescribed Dose: 66 Gy
Reference Point Dosage Given to Date: 4 Gy
Reference Point Session Dosage Given: 2 Gy
Session Number: 2

## 2023-11-08 NOTE — Telephone Encounter (Signed)
Lindsey Williamson states that she is doing fine. She is eating, drinking, and urinating well. She knows to call the office at [phone] if  she has any questions or concerns.

## 2023-11-08 NOTE — Telephone Encounter (Signed)
-----   Message from Nurse Aura Fey sent at 11/07/2023 12:54 PM EST ----- Regarding: 1st Time Follow-up 1st Time Taxol/Carbo follow up Dr Asa Lente patient.  No issues during treatment.

## 2023-11-08 NOTE — Progress Notes (Signed)
[facility] CSW Progress Note  Visual merchandiser  met with pt in supportive services to discuss financial resources.  Pt signed referral for the [employer] gas card application which was sent in on behalf of pt.  Pt encouraged to apply for food stamps as it will qualify her for the [facility] if she is awarded benefits.  Pt also given contact information for [facility] to register for financial assistance.  CSW to remain available as appropriate throughout duration of treatment.      Rachel Moulds, LCSW Clinical Social Worker [facility]

## 2023-11-09 ENCOUNTER — Other Ambulatory Visit: Payer: Self-pay

## 2023-11-09 ENCOUNTER — Ambulatory Visit
Admission: RE | Admit: 2023-11-09 | Discharge: 2023-11-09 | Disposition: A | Discharge status: Home / Self Care | Payer: Medicare Other | Source: Ambulatory Visit | Attending: Radiation Oncology | Admitting: Radiation Oncology

## 2023-11-09 ENCOUNTER — Inpatient Hospital Stay: Payer: Medicare Other

## 2023-11-09 DIAGNOSIS — Z510 Encounter for antineoplastic radiation therapy: Secondary | ICD-10-CM | POA: Diagnosis not present

## 2023-11-09 LAB — RAD ONC ARIA SESSION SUMMARY
Course Elapsed Days: 2
Plan Fractions Treated to Date: 3
Plan Prescribed Dose Per Fraction: 2 Gy
Plan Total Fractions Prescribed: 33
Plan Total Prescribed Dose: 66 Gy
Reference Point Dosage Given to Date: 6 Gy
Reference Point Session Dosage Given: 2 Gy
Session Number: 3

## 2023-11-10 ENCOUNTER — Other Ambulatory Visit: Payer: Self-pay

## 2023-11-10 ENCOUNTER — Ambulatory Visit
Admission: RE | Admit: 2023-11-10 | Discharge: 2023-11-10 | Disposition: A | Discharge status: Home / Self Care | Payer: Medicare Other | Source: Ambulatory Visit | Attending: Radiation Oncology | Admitting: Radiation Oncology

## 2023-11-10 ENCOUNTER — Inpatient Hospital Stay: Payer: Medicare Other

## 2023-11-10 ENCOUNTER — Encounter (HOSPITAL_COMMUNITY): Payer: Self-pay

## 2023-11-10 DIAGNOSIS — Z510 Encounter for antineoplastic radiation therapy: Secondary | ICD-10-CM | POA: Diagnosis not present

## 2023-11-10 LAB — RAD ONC ARIA SESSION SUMMARY
Course Elapsed Days: 3
Plan Fractions Treated to Date: 4
Plan Prescribed Dose Per Fraction: 2 Gy
Plan Total Fractions Prescribed: 33
Plan Total Prescribed Dose: 66 Gy
Reference Point Dosage Given to Date: 8 Gy
Reference Point Session Dosage Given: 2 Gy
Session Number: 4

## 2023-11-11 ENCOUNTER — Other Ambulatory Visit: Payer: Self-pay | Admitting: Physician Assistant

## 2023-11-11 ENCOUNTER — Other Ambulatory Visit: Payer: Self-pay

## 2023-11-11 ENCOUNTER — Inpatient Hospital Stay: Payer: Medicare Other

## 2023-11-11 ENCOUNTER — Ambulatory Visit
Admission: RE | Admit: 2023-11-11 | Discharge: 2023-11-11 | Disposition: A | Discharge status: Home / Self Care | Payer: Medicare Other | Source: Ambulatory Visit | Attending: Radiation Oncology | Admitting: Radiation Oncology

## 2023-11-11 DIAGNOSIS — Z510 Encounter for antineoplastic radiation therapy: Secondary | ICD-10-CM | POA: Diagnosis not present

## 2023-11-11 DIAGNOSIS — C3431 Malignant neoplasm of lower lobe, right bronchus or lung: Secondary | ICD-10-CM

## 2023-11-11 LAB — RAD ONC ARIA SESSION SUMMARY
Course Elapsed Days: 4
Plan Fractions Treated to Date: 5
Plan Prescribed Dose Per Fraction: 2 Gy
Plan Total Fractions Prescribed: 33
Plan Total Prescribed Dose: 66 Gy
Reference Point Dosage Given to Date: 10 Gy
Reference Point Session Dosage Given: 2 Gy
Session Number: 5

## 2023-11-13 NOTE — Progress Notes (Unsigned)
[facility] OFFICE PROGRESS NOTE  [name], MD [address] [place] [address]  DIAGNOSIS: 1) history of stage Ia non-small cell lung cancer, adenocarcinoma the right upper lobe who under went radiation under the care of Dr. Kathrynn Running from 04/23/2022-04/29/22 2 Stage IIIa confirm (T3, N2, M0) non-small cell lung cancer, squamous cell carcinoma. She presented with recurrent tumor at the right lung base, associated right lower lobe metastasis, and right hilar and subcarinal nodal metastases. This was diagnosed in October 2024.  PDL1: 85%  PRIOR THERAPY: Radiation to the right upper lobe under the care of Dr. Kathrynn Running from 04/23/2022-04/29/22  CURRENT THERAPY: Concurrent chemoradiation with carboplatin for an AUC of 2 and paclitaxel 45 mg/m.  First dose on 11/07/23. Status post 1 cycle.   INTERVAL HISTORY: KEMIYAH SILA 72 y.o. female returns to the clinic today for a follow-up visit.  The patient was seen in the clinic on 10/25/2023.  The patient is currently undergoing treatment with concurrent chemoradiation.  She is status post her first week of treatment and she tolerated it well overall but her main concern today is related to gas pain and indigestion.  She was not sure which she is able to take.  He denies any major changes in her bowel movements.  Her last bowel movement was this morning.  Her last day of radiation is tentatively scheduled for 12/26/23.   The patient has diabetes and she is scheduled to see her PCP on Friday regarding her diabetes and blood sugar.  The patient's also been struggling with some dental issues and has been losing weight secondary to her dental issues.  She needs a tooth pulled but is not able to have that performed at this time.  She has been using Tylenol for her tooth and peroxide.  She denies any fevers.  She denies any significant dyspnea on exertion except when she is going upstairs which is not typical for her and  she needs to rest.  She has been struggling with a cough since undergoing her biopsy.  He denies any hemoptysis or chest pain.  Denies any calf swelling or pain.  Denies any nausea or vomiting.  Denies any headache or visual changes.  She is here today for evaluation repeat blood work before undergoing cycle #2.    MEDICAL HISTORY: Past Medical History:  Diagnosis Date   Angioedema 10/17/2023   Arthritis    Diabetes mellitus without complication (HCC)    Hypertension     ALLERGIES:  is allergic to benzonatate, lisinopril, aspirin, azithromycin, and codeine.  MEDICATIONS:  Current Outpatient Medications  Medication Sig Dispense Refill   amoxicillin-clavulanate (AUGMENTIN) 875-125 MG tablet Take 1 tablet by mouth 2 (two) times daily. 14 tablet 0   amLODipine (NORVASC) 5 MG tablet Take 5 mg by mouth daily.     aspirin EC 81 MG tablet Take 81 mg by mouth daily. Swallow whole.     azelastine (ASTELIN) 0.1 % nasal spray Place 2 sprays into both nostrils 2 (two) times daily as needed for rhinitis. 30 mL 5   cetirizine (ZYRTEC ALLERGY) 10 MG tablet Take 1 tablet (10 mg total) by mouth daily. 90 tablet 1   diclofenac Sodium (VOLTAREN) 1 % GEL Apply 1 application  topically 2 (two) times daily as needed (Pain).     empagliflozin (JARDIANCE) 25 MG TABS tablet Take 25 mg by mouth daily.     famotidine (PEPCID) 20 MG tablet Take 1 tablet (20 mg  total) by mouth 2 (two) times daily. 180 tablet 1   metFORMIN (GLUCOPHAGE-XR) 500 MG 24 hr tablet Take 1,000 mg by mouth 2 (two) times daily.     omeprazole (PRILOSEC) 20 MG capsule Take 1 capsule (20 mg total) by mouth daily. 30 capsule 2   pravastatin (PRAVACHOL) 80 MG tablet Take 80 mg by mouth daily.     prochlorperazine (COMPAZINE) 10 MG tablet TAKE 1 TABLET(10 MG) BY MOUTH EVERY 6 HOURS AS NEEDED 30 tablet 2   No current facility-administered medications for this visit.    SURGICAL HISTORY:  Past Surgical History:  Procedure Laterality Date    BRONCHIAL BIOPSY  03/30/2022   Procedure: BRONCHIAL BIOPSIES;  Surgeon: Josephine Igo, DO;  Location: [facility] ENDOSCOPY;  Service: Pulmonary;;   BRONCHIAL BIOPSY  10/17/2023   Procedure: BRONCHIAL BIOPSIES;  Surgeon: Leslye Peer, MD;  Location: [facility] ENDOSCOPY;  Service: Pulmonary;;   BRONCHIAL BRUSHINGS  03/30/2022   Procedure: BRONCHIAL BRUSHINGS;  Surgeon: Josephine Igo, DO;  Location: [facility] ENDOSCOPY;  Service: Pulmonary;;   BRONCHIAL BRUSHINGS  10/17/2023   Procedure: BRONCHIAL BRUSHINGS;  Surgeon: Leslye Peer, MD;  Location: [facility] ENDOSCOPY;  Service: Pulmonary;;   BRONCHIAL NEEDLE ASPIRATION BIOPSY  03/30/2022   Procedure: BRONCHIAL NEEDLE ASPIRATION BIOPSIES;  Surgeon: Josephine Igo, DO;  Location: [facility] ENDOSCOPY;  Service: Pulmonary;;   BRONCHIAL NEEDLE ASPIRATION BIOPSY  10/17/2023   Procedure: BRONCHIAL NEEDLE ASPIRATION BIOPSIES;  Surgeon: Leslye Peer, MD;  Location: [facility] ENDOSCOPY;  Service: Pulmonary;;   EYE SURGERY     FIDUCIAL MARKER PLACEMENT  03/30/2022   Procedure: FIDUCIAL MARKER PLACEMENT;  Surgeon: Josephine Igo, DO;  Location: [facility] ENDOSCOPY;  Service: Pulmonary;;   FINE NEEDLE ASPIRATION  10/17/2023   Procedure: FINE NEEDLE ASPIRATION;  Surgeon: Leslye Peer, MD;  Location: [facility] ENDOSCOPY;  Service: Pulmonary;;   FOREIGN BODY REMOVAL  03/30/2022   Procedure: FOREIGN BODY REMOVAL;  Surgeon: Josephine Igo, DO;  Location: [facility] ENDOSCOPY;  Service: Pulmonary;;   HEMOSTASIS CONTROL  10/17/2023   Procedure: HEMOSTASIS CONTROL;  Surgeon: Leslye Peer, MD;  Location: [facility] ENDOSCOPY;  Service: Pulmonary;;   VIDEO BRONCHOSCOPY WITH ENDOBRONCHIAL ULTRASOUND Bilateral 03/30/2022   Procedure: VIDEO BRONCHOSCOPY WITH ENDOBRONCHIAL ULTRASOUND;  Surgeon: Josephine Igo, DO;  Location: [facility] ENDOSCOPY;  Service: Pulmonary;  Laterality: Bilateral;   VIDEO BRONCHOSCOPY WITH ENDOBRONCHIAL ULTRASOUND Right 10/17/2023   Procedure: VIDEO BRONCHOSCOPY WITH ENDOBRONCHIAL ULTRASOUND;  Surgeon: Leslye Peer, MD;  Location: [facility] ENDOSCOPY;  Service: Pulmonary;  Laterality: Right;   VIDEO BRONCHOSCOPY WITH RADIAL ENDOBRONCHIAL ULTRASOUND  03/30/2022   Procedure: VIDEO BRONCHOSCOPY WITH RADIAL ENDOBRONCHIAL ULTRASOUND;  Surgeon: Josephine Igo, DO;  Location: [facility] ENDOSCOPY;  Service: Pulmonary;;    REVIEW OF SYSTEMS:   Review of Systems  Constitutional: Positive for weight loss and appetite change secondary to dentition. Negative for chills, fatigue, and fever.   HENT: Negative for mouth sores, nosebleeds, sore throat and trouble swallowing.   Eyes: Negative for eye problems and icterus.  Respiratory: Positive for cough. May experience dyspnea on exertion with going up stairs. Negative hemoptysis and wheezing.   Cardiovascular: Negative for chest pain and leg swelling.  Gastrointestinal: Positive for bloating and indigestion. Negative for abdominal pain, constipation, diarrhea, nausea and vomiting.  Genitourinary: Negative for bladder incontinence, difficulty urinating, dysuria, frequency and hematuria.   Musculoskeletal: Negative for back pain, gait problem, neck pain and neck stiffness.  Skin: Negative for itching and rash.  Neurological: Negative for dizziness,  extremity weakness, gait problem, headaches, light-headedness and seizures.  Hematological: Negative for adenopathy. Does not bruise/bleed easily.  Psychiatric/Behavioral: Negative for confusion, depression and sleep disturbance. The patient is not nervous/anxious.     PHYSICAL EXAMINATION:  Blood pressure 137/70, pulse (!) 111, temperature 97.8 F (36.6 C), temperature source Temporal, resp. rate 16, weight 146 lb 9.6 oz (66.5 kg), SpO2 94%.  ECOG PERFORMANCE STATUS: 1  Physical Exam  Constitutional: Oriented to person, place, and time and well-developed, well-nourished, and in no distress.   HENT:  Head: Normocephalic and atraumatic.  Mouth/Throat: Oropharynx is clear and moist. No oropharyngeal exudate.  Eyes:  Conjunctivae are normal. Right eye exhibits no discharge. Left eye exhibits no discharge. No scleral icterus.  Neck: Normal range of motion. Neck supple.  Cardiovascular: Normal rate, regular rhythm, normal heart sounds and intact distal pulses.   Pulmonary/Chest: Effort normal. Quiet breath sounds bilaterally. No respiratory distress. No wheezes. No rales.  Abdominal: Soft. Bowel sounds are normal. Exhibits no distension and no mass. There is no tenderness.  Musculoskeletal: Normal range of motion. Exhibits no edema.  Lymphadenopathy:    No cervical adenopathy.  Neurological: Alert and oriented to person, place, and time. Exhibits normal muscle tone. Gait normal. Coordination normal.  Skin: Skin is warm and dry. No rash noted. Not diaphoretic. No erythema. No pallor.  Psychiatric: Mood, memory and judgment normal.  Vitals reviewed.  LABORATORY DATA: Lab Results  Component Value Date   WBC 6.0 11/14/2023   HGB 10.3 (L) 11/14/2023   HCT 33.1 (L) 11/14/2023   MCV 81.9 11/14/2023   PLT 513 (H) 11/14/2023      Chemistry      Component Value Date/Time   NA 136 11/14/2023 0844   K 3.7 11/14/2023 0844   CL 101 11/14/2023 0844   CO2 25 11/14/2023 0844   BUN 10 11/14/2023 0844   CREATININE 0.59 11/14/2023 0844      Component Value Date/Time   CALCIUM 9.8 11/14/2023 0844   ALKPHOS 56 11/14/2023 0844   AST 8 (L) 11/14/2023 0844   ALT <5 11/14/2023 0844   BILITOT 0.3 11/14/2023 0844       RADIOGRAPHIC STUDIES:  MR Brain W Wo Contrast  Result Date: 10/19/2023 CLINICAL DATA:  Provided history: Non-small cell lung cancer, staging. Primary cancer of right lower lobe of lung. EXAM: MRI HEAD WITHOUT AND WITH CONTRAST TECHNIQUE: Multiplanar, multiecho pulse sequences of the brain and surrounding structures were obtained without and with intravenous contrast. CONTRAST:  7mL GADAVIST GADOBUTROL 1 MMOL/ML IV SOLN COMPARISON:  PET CT 10/10/2023. FINDINGS: The sagittal T1-weighted  post-contrast sequence is moderately motion degraded. Within this limitation, findings are as follows. Brain: Mild generalized cerebral atrophy. Multifocal T2 FLAIR hyperintense signal abnormality within the cerebral white matter, nonspecific but compatible with moderate chronic small vessel ischemic disease. There is no acute infarct. No evidence of an intracranial mass. No chronic intracranial blood products. No extra-axial fluid collection. No midline shift. No pathologic intracranial enhancement identified. Vascular: Maintained flow voids within the proximal large arterial vessels. Small left parietal lobe developmental venous anomaly (anatomic variant). Skull and upper cervical spine: No focal worrisome marrow lesion. Incompletely assessed cervical spondylosis. Sinuses/Orbits: No mass or acute finding within the imaged orbits. Prior bilateral ocular lens replacement. Minimal mucosal thickening within the left maxillary sinus. Other: Trace fluid within the left mastoid air cells. IMPRESSION: 1. Intermittently motion degraded examination as described. 2. No evidence of intracranial metastatic disease. 3. Moderate chronic small vessel ischemic changes within  the cerebral white matter. 4. Mild generalized cerebral atrophy. 5. Minor left maxillary sinus mucosal thickening. Electronically Signed   By: Jackey Loge D.O.   On: 10/19/2023 20:52   DG Chest Port 1 View  Result Date: 10/17/2023 CLINICAL DATA:  Post bronchoscopy with biopsy EXAM: PORTABLE CHEST 1 VIEW COMPARISON:  CT 09/23/2023 FINDINGS: Diffuse reticular opacity, corresponding to emphysema and chronic lung disease. Opacity at the right base corresponding to area of concern on prior CT imaging, fiducial marker in the region. No pleural effusions. No convincing pneumothorax. IMPRESSION: 1. No convincing pneumothorax. 2. Emphysema and chronic lung disease. Right base opacity corresponding to radiation change and masslike consolidation on prior CT  imaging. Electronically Signed   By: Jasmine Pang M.D.   On: 10/17/2023 18:46   DG C-ARM BRONCHOSCOPY  Result Date: 10/17/2023 C-ARM BRONCHOSCOPY: Fluoroscopy was utilized by the requesting physician.  No radiographic interpretation.     ASSESSMENT/PLAN:  1) history of stage Ia non-small cell lung cancer, adenocarcinoma the right upper lobe who under went radiation under the care of Dr. Kathrynn Running from 04/23/2022-04/29/22 2 Stage IIIa confirm (T3, N2, M0) non-small cell lung cancer, squamous cell carcinoma. She presented with recurrent tumor at the right lung base, associated right lower lobe metastasis, and right hilar and subcarinal nodal metastases. This was diagnosed in October 2024.   PDL1 expression 85%.   The patient is undergoing concurrent chemoradiation with carboplatin for an AUC of 2 and paclitaxel 45 mg/m weekly with concurrent radiation.  The patient is interested in this option and first dose on 11/07/23. She is status post 1 cycle.   Labs were reviewed. Recommend she proceed with cycle #2 today as scheduled.   We will see her back in 2 weeks for labs and repeat blood work before undergoing cycle #4.   For bloating and gas pain discussed that it is okay for her to take Gas-X.  I sent her prescription for Prilosec for indigestion and heartburn.  The patient has been struggling with a cough since undergoing her biopsy.  She had x-rays performed following her biopsy.  She is not able to take Tessalon and Delsym was ineffective for her.  I encouraged her to pick up Robitussin to see if this is effective.  I also will send her antibiotics (augmentin) in case there is a bacterial component to her cough.  She is not able to take azithromycin or doxycycline per her allergy list.  I let the patient know we have the symptom management clinic and if she has any new or worsening symptoms to call us if she needs to be seen.  She is going to see her PCP on Friday regarding her diabetes.  The  patient's premedications with steroids may be contributing to her elevated blood sugar and there can talk about medication management of her diabetes later this week.  The patient I also discussed avoiding gas producing food.   The patient needs to have dental work performed and is having some weight loss secondary to this.  We discussed soft foods.  We previously discussed protein supplemental drinks; however, she would need to drink Glucerna since she has diabetes.  The patient was advised to call immediately if she has any concerning symptoms in the interval. The patient voices understanding of current disease status and treatment options and is in agreement with the current care plan. All questions were answered. The patient knows to call the clinic with any problems, questions or concerns. We can certainly see  the patient much sooner if necessary    No orders of the defined types were placed in this encounter.    The total time spent in the appointment was 20-29 minutes  Maansi Wike L Ruthanne Mcneish, PA-C 11/15/23

## 2023-11-14 ENCOUNTER — Inpatient Hospital Stay: Payer: Medicare Other

## 2023-11-14 ENCOUNTER — Ambulatory Visit
Admission: RE | Admit: 2023-11-14 | Discharge: 2023-11-14 | Disposition: A | Discharge status: Home / Self Care | Payer: Medicare Other | Source: Ambulatory Visit | Attending: Radiation Oncology | Admitting: Radiation Oncology

## 2023-11-14 ENCOUNTER — Other Ambulatory Visit: Payer: Self-pay

## 2023-11-14 DIAGNOSIS — Z510 Encounter for antineoplastic radiation therapy: Secondary | ICD-10-CM | POA: Diagnosis not present

## 2023-11-14 DIAGNOSIS — C3431 Malignant neoplasm of lower lobe, right bronchus or lung: Secondary | ICD-10-CM

## 2023-11-14 LAB — CBC WITH DIFFERENTIAL (CANCER CENTER ONLY)
Abs Immature Granulocytes: 0.04 10*3/uL (ref 0.00–0.07)
Basophils Absolute: 0.1 10*3/uL (ref 0.0–0.1)
Basophils Relative: 1 %
Eosinophils Absolute: 0.1 10*3/uL (ref 0.0–0.5)
Eosinophils Relative: 2 %
HCT: 33.1 % — ABNORMAL LOW (ref 36.0–46.0)
Hemoglobin: 10.3 g/dL — ABNORMAL LOW (ref 12.0–15.0)
Immature Granulocytes: 1 %
Lymphocytes Relative: 12 %
Lymphs Abs: 0.7 10*3/uL (ref 0.7–4.0)
MCH: 25.5 pg — ABNORMAL LOW (ref 26.0–34.0)
MCHC: 31.1 g/dL (ref 30.0–36.0)
MCV: 81.9 fL (ref 80.0–100.0)
Monocytes Absolute: 0.8 10*3/uL (ref 0.1–1.0)
Monocytes Relative: 13 %
Neutro Abs: 4.3 10*3/uL (ref 1.7–7.7)
Neutrophils Relative %: 71 %
Platelet Count: 513 10*3/uL — ABNORMAL HIGH (ref 150–400)
RBC: 4.04 MIL/uL (ref 3.87–5.11)
RDW: 14.5 % (ref 11.5–15.5)
WBC Count: 6 10*3/uL (ref 4.0–10.5)
nRBC: 0 % (ref 0.0–0.2)

## 2023-11-14 LAB — RAD ONC ARIA SESSION SUMMARY
Course Elapsed Days: 7
Plan Fractions Treated to Date: 6
Plan Prescribed Dose Per Fraction: 2 Gy
Plan Total Fractions Prescribed: 33
Plan Total Prescribed Dose: 66 Gy
Reference Point Dosage Given to Date: 12 Gy
Reference Point Session Dosage Given: 2 Gy
Session Number: 6

## 2023-11-14 LAB — CMP (CANCER CENTER ONLY)
ALT: <5 U/L (ref 0–44)
AST: 8 U/L — ABNORMAL LOW (ref 15–41)
Albumin: 3.4 g/dL — ABNORMAL LOW (ref 3.5–5.0)
Alkaline Phosphatase: 56 U/L (ref 38–126)
Anion gap: 10 (ref 5–15)
BUN: 10 mg/dL (ref 8–23)
CO2: 25 mmol/L (ref 22–32)
Calcium: 9.8 mg/dL (ref 8.9–10.3)
Chloride: 101 mmol/L (ref 98–111)
Creatinine: 0.59 mg/dL (ref 0.44–1.00)
GFR, Estimated: >60 mL/min (ref >60)
Glucose, Bld: 159 mg/dL — ABNORMAL HIGH (ref 70–99)
Potassium: 3.7 mmol/L (ref 3.5–5.1)
Sodium: 136 mmol/L (ref 135–145)
Total Bilirubin: 0.3 mg/dL (ref <1.2)
Total Protein: 8 g/dL (ref 6.5–8.1)

## 2023-11-15 ENCOUNTER — Ambulatory Visit
Admission: RE | Admit: 2023-11-15 | Discharge: 2023-11-15 | Disposition: A | Discharge status: Home / Self Care | Payer: Medicare Other | Source: Ambulatory Visit | Attending: Radiation Oncology

## 2023-11-15 ENCOUNTER — Other Ambulatory Visit: Payer: Self-pay

## 2023-11-15 ENCOUNTER — Inpatient Hospital Stay (HOSPITAL_BASED_OUTPATIENT_CLINIC_OR_DEPARTMENT_OTHER): Payer: Medicare Other | Admitting: Physician Assistant

## 2023-11-15 ENCOUNTER — Inpatient Hospital Stay: Payer: Medicare Other

## 2023-11-15 VITALS — BP 137/70 | HR 111 | Temp 97.8°F | Resp 16 | Wt 146.6 lb

## 2023-11-15 VITALS — HR 98

## 2023-11-15 DIAGNOSIS — K30 Functional dyspepsia: Secondary | ICD-10-CM

## 2023-11-15 DIAGNOSIS — C3431 Malignant neoplasm of lower lobe, right bronchus or lung: Secondary | ICD-10-CM

## 2023-11-15 DIAGNOSIS — Z5111 Encounter for antineoplastic chemotherapy: Secondary | ICD-10-CM | POA: Insufficient documentation

## 2023-11-15 DIAGNOSIS — Z510 Encounter for antineoplastic radiation therapy: Secondary | ICD-10-CM | POA: Diagnosis not present

## 2023-11-15 DIAGNOSIS — R059 Cough, unspecified: Secondary | ICD-10-CM

## 2023-11-15 LAB — RAD ONC ARIA SESSION SUMMARY
Course Elapsed Days: 8
Plan Fractions Treated to Date: 7
Plan Prescribed Dose Per Fraction: 2 Gy
Plan Total Fractions Prescribed: 33
Plan Total Prescribed Dose: 66 Gy
Reference Point Dosage Given to Date: 14 Gy
Reference Point Session Dosage Given: 2 Gy
Session Number: 7

## 2023-11-15 MED ORDER — OMEPRAZOLE 20 MG PO CPDR: 20.0000 mg | DELAYED_RELEASE_CAPSULE | Freq: Every day | ORAL | 2 refills | Status: DC

## 2023-11-15 MED ORDER — PALONOSETRON HCL INJECTION 0.25 MG/5ML
0.2500 mg | Freq: Once | INTRAVENOUS | Status: AC
  Administered 2023-11-15: 0.25 mg via INTRAVENOUS
  Filled 2023-11-15: qty 5

## 2023-11-15 MED ORDER — SODIUM CHLORIDE 0.9 % IV SOLN
160.6000 mg | Freq: Once | INTRAVENOUS | Status: AC
  Administered 2023-11-15: 160 mg via INTRAVENOUS
  Filled 2023-11-15: qty 16

## 2023-11-15 MED ORDER — AMOXICILLIN-POT CLAVULANATE 875-125 MG PO TABS: 1.0000 | ORAL_TABLET | Freq: Two times a day (BID) | ORAL | 0 refills | Status: DC

## 2023-11-15 MED ORDER — DIPHENHYDRAMINE HCL 50 MG/ML IJ SOLN
50.0000 mg | Freq: Once | INTRAMUSCULAR | Status: AC
  Administered 2023-11-15: 50 mg via INTRAVENOUS
  Filled 2023-11-15: qty 1

## 2023-11-15 MED ORDER — FAMOTIDINE IN NACL 20-0.9 MG/50ML-% IV SOLN
20.0000 mg | Freq: Once | INTRAVENOUS | Status: AC
  Administered 2023-11-15: 20 mg via INTRAVENOUS
  Filled 2023-11-15: qty 50

## 2023-11-15 MED ORDER — DEXAMETHASONE SODIUM PHOSPHATE 10 MG/ML IJ SOLN
10.0000 mg | Freq: Once | INTRAMUSCULAR | Status: AC
  Administered 2023-11-15: 10 mg via INTRAVENOUS
  Filled 2023-11-15: qty 1

## 2023-11-15 MED ORDER — SODIUM CHLORIDE 0.9 % IV SOLN: INTRAVENOUS | Status: DC

## 2023-11-15 MED ORDER — PACLITAXEL CHEMO INJECTION 300 MG/50ML
45.0000 mg/m2 | Freq: Once | INTRAVENOUS | Status: AC
  Administered 2023-11-15: 78 mg via INTRAVENOUS
  Filled 2023-11-15: qty 13

## 2023-11-15 NOTE — Patient Instructions (Signed)
 [facility]  Discharge Instructions: Thank you for choosing [facility] to provide your oncology and hematology care.   If you have a lab appointment with the Cancer Center, please go directly to the Cancer Center and check in at the registration area.   Wear comfortable clothing and clothing appropriate for easy access to any Portacath or PICC line.   We strive to give you quality time with your provider. You may need to reschedule your appointment if you arrive late (15 or more minutes).  Arriving late affects you and other patients whose appointments are after yours.  Also, if you miss three or more appointments without notifying the office, you may be dismissed from the clinic at the provider's discretion.      For prescription refill requests, have your pharmacy contact our office and allow 72 hours for refills to be completed.    Today you received the following chemotherapy and/or immunotherapy agents paclitaxel, carboplatin      To help prevent nausea and vomiting after your treatment, we encourage you to take your nausea medication as directed.  BELOW ARE SYMPTOMS THAT SHOULD BE REPORTED IMMEDIATELY: *FEVER GREATER THAN 100.4 F (38 C) OR HIGHER *CHILLS OR SWEATING *NAUSEA AND VOMITING THAT IS NOT CONTROLLED WITH YOUR NAUSEA MEDICATION *UNUSUAL SHORTNESS OF BREATH *UNUSUAL BRUISING OR BLEEDING *URINARY PROBLEMS (pain or burning when urinating, or frequent urination) *BOWEL PROBLEMS (unusual diarrhea, constipation, pain near the anus) TENDERNESS IN MOUTH AND THROAT WITH OR WITHOUT PRESENCE OF ULCERS (sore throat, sores in mouth, or a toothache) UNUSUAL RASH, SWELLING OR PAIN  UNUSUAL VAGINAL DISCHARGE OR ITCHING   Items with * indicate a potential emergency and should be followed up as soon as possible or go to the Emergency Department if any problems should occur.  Please show the CHEMOTHERAPY ALERT CARD or  IMMUNOTHERAPY ALERT CARD at check-in to the Emergency Department and triage nurse.  Should you have questions after your visit or need to cancel or reschedule your appointment, please contact [facility] - A DEPT OF [facility]  Dept: [phone]  and follow the prompts.  Office hours are 8:00 a.m. to 4:30 p.m. Monday - Friday. Please note that voicemails left after 4:00 p.m. may not be returned until the following business day.  We are closed weekends and major holidays. You have access to a nurse at all times for urgent questions. Please call the main number to the clinic Dept: [phone] and follow the prompts.   For any non-urgent questions, you may also contact your provider using MyChart. We now offer e-Visits for anyone 5 and older to request care online for non-urgent symptoms. For details visit [email or web address].   Also download the MyChart app! Go to the app store, search "MyChart", open the app, select [place], and log in with your MyChart username and password.

## 2023-11-16 ENCOUNTER — Ambulatory Visit
Admission: RE | Admit: 2023-11-16 | Discharge: 2023-11-16 | Disposition: A | Discharge status: Home / Self Care | Payer: Medicare Other | Source: Ambulatory Visit | Attending: Radiation Oncology | Admitting: Radiation Oncology

## 2023-11-16 ENCOUNTER — Other Ambulatory Visit: Payer: Self-pay

## 2023-11-16 DIAGNOSIS — Z510 Encounter for antineoplastic radiation therapy: Secondary | ICD-10-CM | POA: Diagnosis not present

## 2023-11-16 LAB — RAD ONC ARIA SESSION SUMMARY
Course Elapsed Days: 9
Plan Fractions Treated to Date: 8
Plan Prescribed Dose Per Fraction: 2 Gy
Plan Total Fractions Prescribed: 33
Plan Total Prescribed Dose: 66 Gy
Reference Point Dosage Given to Date: 16 Gy
Reference Point Session Dosage Given: 2 Gy
Session Number: 8

## 2023-11-17 ENCOUNTER — Other Ambulatory Visit: Payer: Self-pay

## 2023-11-17 ENCOUNTER — Ambulatory Visit
Admission: RE | Admit: 2023-11-17 | Discharge: 2023-11-17 | Disposition: A | Discharge status: Home / Self Care | Payer: Medicare Other | Source: Ambulatory Visit | Attending: Radiation Oncology | Admitting: Radiation Oncology

## 2023-11-17 DIAGNOSIS — Z510 Encounter for antineoplastic radiation therapy: Secondary | ICD-10-CM | POA: Diagnosis not present

## 2023-11-17 LAB — RAD ONC ARIA SESSION SUMMARY
Course Elapsed Days: 10
Plan Fractions Treated to Date: 9
Plan Prescribed Dose Per Fraction: 2 Gy
Plan Total Fractions Prescribed: 33
Plan Total Prescribed Dose: 66 Gy
Reference Point Dosage Given to Date: 18 Gy
Reference Point Session Dosage Given: 2 Gy
Session Number: 9

## 2023-11-18 ENCOUNTER — Other Ambulatory Visit: Payer: Self-pay

## 2023-11-18 ENCOUNTER — Ambulatory Visit
Admission: RE | Admit: 2023-11-18 | Discharge: 2023-11-18 | Disposition: A | Discharge status: Home / Self Care | Payer: Medicare Other | Source: Ambulatory Visit | Attending: Radiation Oncology

## 2023-11-18 DIAGNOSIS — Z510 Encounter for antineoplastic radiation therapy: Secondary | ICD-10-CM | POA: Diagnosis not present

## 2023-11-18 LAB — RAD ONC ARIA SESSION SUMMARY
Course Elapsed Days: 11
Plan Fractions Treated to Date: 10
Plan Prescribed Dose Per Fraction: 2 Gy
Plan Total Fractions Prescribed: 33
Plan Total Prescribed Dose: 66 Gy
Reference Point Dosage Given to Date: 20 Gy
Reference Point Session Dosage Given: 2 Gy
Session Number: 10

## 2023-11-21 ENCOUNTER — Inpatient Hospital Stay: Payer: Medicare Other

## 2023-11-21 ENCOUNTER — Other Ambulatory Visit: Payer: Self-pay

## 2023-11-21 ENCOUNTER — Ambulatory Visit
Admission: RE | Admit: 2023-11-21 | Discharge: 2023-11-21 | Disposition: A | Discharge status: Home / Self Care | Payer: Medicare Other | Source: Ambulatory Visit | Attending: Radiation Oncology

## 2023-11-21 VITALS — BP 145/75 | HR 99 | Temp 98.2°F | Resp 16 | Ht 64.0 in | Wt 148.2 lb

## 2023-11-21 DIAGNOSIS — C3431 Malignant neoplasm of lower lobe, right bronchus or lung: Secondary | ICD-10-CM

## 2023-11-21 DIAGNOSIS — Z510 Encounter for antineoplastic radiation therapy: Secondary | ICD-10-CM | POA: Diagnosis not present

## 2023-11-21 LAB — CBC WITH DIFFERENTIAL (CANCER CENTER ONLY)
Abs Immature Granulocytes: 0.04 10*3/uL (ref 0.00–0.07)
Basophils Absolute: 0.1 10*3/uL (ref 0.0–0.1)
Basophils Relative: 1 %
Eosinophils Absolute: 0.1 10*3/uL (ref 0.0–0.5)
Eosinophils Relative: 3 %
HCT: 29 % — ABNORMAL LOW (ref 36.0–46.0)
Hemoglobin: 9.1 g/dL — ABNORMAL LOW (ref 12.0–15.0)
Immature Granulocytes: 1 %
Lymphocytes Relative: 10 %
Lymphs Abs: 0.5 10*3/uL — ABNORMAL LOW (ref 0.7–4.0)
MCH: 25.6 pg — ABNORMAL LOW (ref 26.0–34.0)
MCHC: 31.4 g/dL (ref 30.0–36.0)
MCV: 81.7 fL (ref 80.0–100.0)
Monocytes Absolute: 0.5 10*3/uL (ref 0.1–1.0)
Monocytes Relative: 11 %
Neutro Abs: 3.5 10*3/uL (ref 1.7–7.7)
Neutrophils Relative %: 74 %
Platelet Count: 400 10*3/uL (ref 150–400)
RBC: 3.55 MIL/uL — ABNORMAL LOW (ref 3.87–5.11)
RDW: 14.8 % (ref 11.5–15.5)
WBC Count: 4.8 10*3/uL (ref 4.0–10.5)
nRBC: 0 % (ref 0.0–0.2)

## 2023-11-21 LAB — RAD ONC ARIA SESSION SUMMARY
Course Elapsed Days: 14
Plan Fractions Treated to Date: 11
Plan Prescribed Dose Per Fraction: 2 Gy
Plan Total Fractions Prescribed: 33
Plan Total Prescribed Dose: 66 Gy
Reference Point Dosage Given to Date: 22 Gy
Reference Point Session Dosage Given: 2 Gy
Session Number: 11

## 2023-11-21 LAB — CMP (CANCER CENTER ONLY)
ALT: <5 U/L (ref 0–44)
AST: 8 U/L — ABNORMAL LOW (ref 15–41)
Albumin: 3.1 g/dL — ABNORMAL LOW (ref 3.5–5.0)
Alkaline Phosphatase: 47 U/L (ref 38–126)
Anion gap: 8 (ref 5–15)
BUN: 14 mg/dL (ref 8–23)
CO2: 24 mmol/L (ref 22–32)
Calcium: 8.5 mg/dL — ABNORMAL LOW (ref 8.9–10.3)
Chloride: 105 mmol/L (ref 98–111)
Creatinine: 0.6 mg/dL (ref 0.44–1.00)
GFR, Estimated: >60 mL/min (ref >60)
Glucose, Bld: 150 mg/dL — ABNORMAL HIGH (ref 70–99)
Potassium: 3.4 mmol/L — ABNORMAL LOW (ref 3.5–5.1)
Sodium: 137 mmol/L (ref 135–145)
Total Bilirubin: 0.2 mg/dL (ref <1.2)
Total Protein: 7.1 g/dL (ref 6.5–8.1)

## 2023-11-21 MED ORDER — PALONOSETRON HCL INJECTION 0.25 MG/5ML
0.2500 mg | Freq: Once | INTRAVENOUS | Status: AC
  Administered 2023-11-21: 0.25 mg via INTRAVENOUS
  Filled 2023-11-21: qty 5

## 2023-11-21 MED ORDER — FAMOTIDINE IN NACL 20-0.9 MG/50ML-% IV SOLN
20.0000 mg | Freq: Once | INTRAVENOUS | Status: AC
  Administered 2023-11-21: 20 mg via INTRAVENOUS
  Filled 2023-11-21: qty 50

## 2023-11-21 MED ORDER — DEXAMETHASONE SODIUM PHOSPHATE 10 MG/ML IJ SOLN
10.0000 mg | Freq: Once | INTRAMUSCULAR | Status: AC
  Administered 2023-11-21: 10 mg via INTRAVENOUS
  Filled 2023-11-21: qty 1

## 2023-11-21 MED ORDER — DIPHENHYDRAMINE HCL 50 MG/ML IJ SOLN
50.0000 mg | Freq: Once | INTRAMUSCULAR | Status: AC
  Administered 2023-11-21: 50 mg via INTRAVENOUS
  Filled 2023-11-21: qty 1

## 2023-11-21 MED ORDER — SODIUM CHLORIDE 0.9 % IV SOLN: INTRAVENOUS | Status: DC

## 2023-11-21 MED ORDER — SODIUM CHLORIDE 0.9 % IV SOLN
45.0000 mg/m2 | Freq: Once | INTRAVENOUS | Status: AC
  Administered 2023-11-21: 78 mg via INTRAVENOUS
  Filled 2023-11-21: qty 13

## 2023-11-21 MED ORDER — SODIUM CHLORIDE 0.9 % IV SOLN
160.6000 mg | Freq: Once | INTRAVENOUS | Status: AC
  Administered 2023-11-21: 160 mg via INTRAVENOUS
  Filled 2023-11-21: qty 16

## 2023-11-21 NOTE — Patient Instructions (Addendum)
[facility]  Discharge Instructions: Thank you for choosing [facility] to provide your oncology and hematology care.   If you have a lab appointment with the Cancer Center, please go directly to the Cancer Center and check in at the registration area.   Wear comfortable clothing and clothing appropriate for easy access to any Portacath or PICC line.   We strive to give you quality time with your provider. You may need to reschedule your appointment if you arrive late (15 or more minutes).  Arriving late affects you and other patients whose appointments are after yours.  Also, if you miss three or more appointments without notifying the office, you may be dismissed from the clinic at the provider's discretion.      For prescription refill requests, have your pharmacy contact our office and allow 72 hours for refills to be completed.    Today you received the following chemotherapy and/or immunotherapy agents: Paclitaxel (Taxol) and Carboplatin.      To help prevent nausea and vomiting after your treatment, we encourage you to take your nausea medication as directed.  BELOW ARE SYMPTOMS THAT SHOULD BE REPORTED IMMEDIATELY: *FEVER GREATER THAN 100.4 F (38 C) OR HIGHER *CHILLS OR SWEATING *NAUSEA AND VOMITING THAT IS NOT CONTROLLED WITH YOUR NAUSEA MEDICATION *UNUSUAL SHORTNESS OF BREATH *UNUSUAL BRUISING OR BLEEDING *URINARY PROBLEMS (pain or burning when urinating, or frequent urination) *BOWEL PROBLEMS (unusual diarrhea, constipation, pain near the anus) TENDERNESS IN MOUTH AND THROAT WITH OR WITHOUT PRESENCE OF ULCERS (sore throat, sores in mouth, or a toothache) UNUSUAL RASH, SWELLING OR PAIN  UNUSUAL VAGINAL DISCHARGE OR ITCHING   Items with * indicate a potential emergency and should be followed up as soon as possible or go to the Emergency Department if any problems should occur.  Please show the CHEMOTHERAPY  ALERT CARD or IMMUNOTHERAPY ALERT CARD at check-in to the Emergency Department and triage nurse.  Should you have questions after your visit or need to cancel or reschedule your appointment, please contact [facility] - [facility] OF [facility]  Dept: [phone]  and follow the prompts.  Office hours are 8:00 a.m. to 4:30 p.m. Monday - Friday. Please note that voicemails left after 4:00 p.m. may not be returned until the following business day.  We are closed weekends and major holidays. You have access to a nurse at all times for urgent questions. Please call the main number to the clinic Dept: [phone] and follow the prompts.   For any non-urgent questions, you may also contact your provider using MyChart. We now offer e-Visits for anyone 33 and older to request care online for non-urgent symptoms. For details visit [email or web address].   Also download the MyChart app! Go to the app store, search "MyChart", open the app, select [place], and log in with your MyChart username and password.

## 2023-11-22 ENCOUNTER — Other Ambulatory Visit: Payer: Self-pay

## 2023-11-22 ENCOUNTER — Ambulatory Visit
Admission: RE | Admit: 2023-11-22 | Discharge: 2023-11-22 | Disposition: A | Discharge status: Home / Self Care | Payer: Medicare Other | Source: Ambulatory Visit | Attending: Radiation Oncology | Admitting: Radiation Oncology

## 2023-11-22 DIAGNOSIS — Z510 Encounter for antineoplastic radiation therapy: Secondary | ICD-10-CM | POA: Diagnosis not present

## 2023-11-22 LAB — RAD ONC ARIA SESSION SUMMARY
Course Elapsed Days: 15
Plan Fractions Treated to Date: 12
Plan Prescribed Dose Per Fraction: 2 Gy
Plan Total Fractions Prescribed: 33
Plan Total Prescribed Dose: 66 Gy
Reference Point Dosage Given to Date: 24 Gy
Reference Point Session Dosage Given: 1.7716 Gy
Session Number: 12

## 2023-11-23 ENCOUNTER — Inpatient Hospital Stay: Payer: Medicare Other

## 2023-11-23 ENCOUNTER — Other Ambulatory Visit: Payer: Self-pay

## 2023-11-23 ENCOUNTER — Ambulatory Visit
Admission: RE | Admit: 2023-11-23 | Discharge: 2023-11-23 | Disposition: A | Discharge status: Home / Self Care | Payer: Medicare Other | Source: Ambulatory Visit | Attending: Radiation Oncology | Admitting: Radiation Oncology

## 2023-11-23 DIAGNOSIS — Z510 Encounter for antineoplastic radiation therapy: Secondary | ICD-10-CM | POA: Diagnosis not present

## 2023-11-23 LAB — RAD ONC ARIA SESSION SUMMARY
Course Elapsed Days: 16
Plan Fractions Treated to Date: 13
Plan Prescribed Dose Per Fraction: 2 Gy
Plan Total Fractions Prescribed: 33
Plan Total Prescribed Dose: 66 Gy
Reference Point Dosage Given to Date: 26 Gy
Reference Point Session Dosage Given: 2 Gy
Session Number: 13

## 2023-11-28 ENCOUNTER — Ambulatory Visit
Admission: RE | Admit: 2023-11-28 | Discharge: 2023-11-28 | Disposition: A | Discharge status: Home / Self Care | Payer: Medicare Other | Source: Ambulatory Visit | Attending: Radiation Oncology | Admitting: Radiation Oncology

## 2023-11-28 ENCOUNTER — Inpatient Hospital Stay: Payer: Medicare Other

## 2023-11-28 ENCOUNTER — Ambulatory Visit: Payer: Medicare Other

## 2023-11-28 ENCOUNTER — Other Ambulatory Visit: Payer: Self-pay

## 2023-11-28 DIAGNOSIS — Z5111 Encounter for antineoplastic chemotherapy: Secondary | ICD-10-CM | POA: Insufficient documentation

## 2023-11-28 DIAGNOSIS — Z79899 Other long term (current) drug therapy: Secondary | ICD-10-CM | POA: Insufficient documentation

## 2023-11-28 DIAGNOSIS — Z510 Encounter for antineoplastic radiation therapy: Secondary | ICD-10-CM | POA: Diagnosis present

## 2023-11-28 DIAGNOSIS — K30 Functional dyspepsia: Secondary | ICD-10-CM | POA: Diagnosis not present

## 2023-11-28 DIAGNOSIS — I10 Essential (primary) hypertension: Secondary | ICD-10-CM | POA: Insufficient documentation

## 2023-11-28 DIAGNOSIS — C3431 Malignant neoplasm of lower lobe, right bronchus or lung: Secondary | ICD-10-CM | POA: Insufficient documentation

## 2023-11-28 DIAGNOSIS — Z7982 Long term (current) use of aspirin: Secondary | ICD-10-CM | POA: Insufficient documentation

## 2023-11-28 DIAGNOSIS — D649 Anemia, unspecified: Secondary | ICD-10-CM | POA: Insufficient documentation

## 2023-11-28 DIAGNOSIS — K209 Esophagitis, unspecified without bleeding: Secondary | ICD-10-CM | POA: Insufficient documentation

## 2023-11-28 DIAGNOSIS — R141 Gas pain: Secondary | ICD-10-CM | POA: Diagnosis not present

## 2023-11-28 DIAGNOSIS — C7801 Secondary malignant neoplasm of right lung: Secondary | ICD-10-CM | POA: Diagnosis not present

## 2023-11-28 DIAGNOSIS — E1165 Type 2 diabetes mellitus with hyperglycemia: Secondary | ICD-10-CM | POA: Insufficient documentation

## 2023-11-28 DIAGNOSIS — Z809 Family history of malignant neoplasm, unspecified: Secondary | ICD-10-CM | POA: Diagnosis not present

## 2023-11-28 DIAGNOSIS — R059 Cough, unspecified: Secondary | ICD-10-CM | POA: Diagnosis not present

## 2023-11-28 DIAGNOSIS — Z923 Personal history of irradiation: Secondary | ICD-10-CM | POA: Insufficient documentation

## 2023-11-28 DIAGNOSIS — Z7984 Long term (current) use of oral hypoglycemic drugs: Secondary | ICD-10-CM | POA: Insufficient documentation

## 2023-11-28 DIAGNOSIS — F17210 Nicotine dependence, cigarettes, uncomplicated: Secondary | ICD-10-CM | POA: Diagnosis not present

## 2023-11-28 LAB — RAD ONC ARIA SESSION SUMMARY
Course Elapsed Days: 21
Plan Fractions Treated to Date: 14
Plan Prescribed Dose Per Fraction: 2 Gy
Plan Total Fractions Prescribed: 33
Plan Total Prescribed Dose: 66 Gy
Reference Point Dosage Given to Date: 28 Gy
Reference Point Session Dosage Given: 2 Gy
Session Number: 14

## 2023-11-29 ENCOUNTER — Inpatient Hospital Stay: Payer: Medicare Other

## 2023-11-29 ENCOUNTER — Other Ambulatory Visit: Payer: Self-pay

## 2023-11-29 ENCOUNTER — Ambulatory Visit
Admission: RE | Admit: 2023-11-29 | Discharge: 2023-11-29 | Disposition: A | Discharge status: Home / Self Care | Payer: Medicare Other | Source: Ambulatory Visit | Attending: Radiation Oncology | Admitting: Radiation Oncology

## 2023-11-29 DIAGNOSIS — Z510 Encounter for antineoplastic radiation therapy: Secondary | ICD-10-CM | POA: Diagnosis not present

## 2023-11-29 DIAGNOSIS — C3431 Malignant neoplasm of lower lobe, right bronchus or lung: Secondary | ICD-10-CM

## 2023-11-29 LAB — CBC WITH DIFFERENTIAL (CANCER CENTER ONLY)
Abs Immature Granulocytes: 0.01 10*3/uL (ref 0.00–0.07)
Basophils Absolute: 0 10*3/uL (ref 0.0–0.1)
Basophils Relative: 1 %
Eosinophils Absolute: 0.1 10*3/uL (ref 0.0–0.5)
Eosinophils Relative: 4 %
HCT: 28.9 % — ABNORMAL LOW (ref 36.0–46.0)
Hemoglobin: 9.2 g/dL — ABNORMAL LOW (ref 12.0–15.0)
Immature Granulocytes: 0 %
Lymphocytes Relative: 10 %
Lymphs Abs: 0.3 10*3/uL — ABNORMAL LOW (ref 0.7–4.0)
MCH: 25.8 pg — ABNORMAL LOW (ref 26.0–34.0)
MCHC: 31.8 g/dL (ref 30.0–36.0)
MCV: 81.2 fL (ref 80.0–100.0)
Monocytes Absolute: 0.6 10*3/uL (ref 0.1–1.0)
Monocytes Relative: 19 %
Neutro Abs: 2.3 10*3/uL (ref 1.7–7.7)
Neutrophils Relative %: 66 %
Platelet Count: 321 10*3/uL (ref 150–400)
RBC: 3.56 MIL/uL — ABNORMAL LOW (ref 3.87–5.11)
RDW: 16.2 % — ABNORMAL HIGH (ref 11.5–15.5)
WBC Count: 3.4 10*3/uL — ABNORMAL LOW (ref 4.0–10.5)
nRBC: 0 % (ref 0.0–0.2)

## 2023-11-29 LAB — RAD ONC ARIA SESSION SUMMARY
Course Elapsed Days: 22
Plan Fractions Treated to Date: 15
Plan Prescribed Dose Per Fraction: 2 Gy
Plan Total Fractions Prescribed: 33
Plan Total Prescribed Dose: 66 Gy
Reference Point Dosage Given to Date: 30 Gy
Reference Point Session Dosage Given: 2 Gy
Session Number: 15

## 2023-11-29 LAB — CMP (CANCER CENTER ONLY)
ALT: <5 U/L (ref 0–44)
AST: 9 U/L — ABNORMAL LOW (ref 15–41)
Albumin: 3.2 g/dL — ABNORMAL LOW (ref 3.5–5.0)
Alkaline Phosphatase: 47 U/L (ref 38–126)
Anion gap: 8 (ref 5–15)
BUN: 14 mg/dL (ref 8–23)
CO2: 24 mmol/L (ref 22–32)
Calcium: 9.2 mg/dL (ref 8.9–10.3)
Chloride: 106 mmol/L (ref 98–111)
Creatinine: 0.52 mg/dL (ref 0.44–1.00)
GFR, Estimated: >60 mL/min (ref >60)
Glucose, Bld: 127 mg/dL — ABNORMAL HIGH (ref 70–99)
Potassium: 3.5 mmol/L (ref 3.5–5.1)
Sodium: 138 mmol/L (ref 135–145)
Total Bilirubin: 0.2 mg/dL (ref <1.2)
Total Protein: 7 g/dL (ref 6.5–8.1)

## 2023-11-30 ENCOUNTER — Inpatient Hospital Stay (HOSPITAL_BASED_OUTPATIENT_CLINIC_OR_DEPARTMENT_OTHER): Payer: Medicare Other | Admitting: Internal Medicine

## 2023-11-30 ENCOUNTER — Inpatient Hospital Stay: Payer: Medicare Other

## 2023-11-30 ENCOUNTER — Other Ambulatory Visit: Payer: Self-pay

## 2023-11-30 ENCOUNTER — Ambulatory Visit
Admission: RE | Admit: 2023-11-30 | Discharge: 2023-11-30 | Disposition: A | Discharge status: Home / Self Care | Payer: Medicare Other | Source: Ambulatory Visit | Attending: Radiation Oncology

## 2023-11-30 VITALS — BP 137/75 | HR 94

## 2023-11-30 VITALS — BP 144/81 | HR 92 | Temp 97.8°F | Resp 16 | Ht 64.0 in | Wt 148.0 lb

## 2023-11-30 DIAGNOSIS — C3431 Malignant neoplasm of lower lobe, right bronchus or lung: Secondary | ICD-10-CM | POA: Diagnosis not present

## 2023-11-30 DIAGNOSIS — Z510 Encounter for antineoplastic radiation therapy: Secondary | ICD-10-CM | POA: Diagnosis not present

## 2023-11-30 LAB — RAD ONC ARIA SESSION SUMMARY
Course Elapsed Days: 23
Plan Fractions Treated to Date: 16
Plan Prescribed Dose Per Fraction: 2 Gy
Plan Total Fractions Prescribed: 33
Plan Total Prescribed Dose: 66 Gy
Reference Point Dosage Given to Date: 32 Gy
Reference Point Session Dosage Given: 2 Gy
Session Number: 16

## 2023-11-30 MED ORDER — FAMOTIDINE IN NACL 20-0.9 MG/50ML-% IV SOLN
20.0000 mg | Freq: Once | INTRAVENOUS | Status: AC
  Administered 2023-11-30: 20 mg via INTRAVENOUS
  Filled 2023-11-30: qty 50

## 2023-11-30 MED ORDER — SODIUM CHLORIDE 0.9 % IV SOLN
160.6000 mg | Freq: Once | INTRAVENOUS | Status: AC
  Administered 2023-11-30: 160 mg via INTRAVENOUS
  Filled 2023-11-30: qty 16

## 2023-11-30 MED ORDER — SODIUM CHLORIDE 0.9 % IV SOLN
INTRAVENOUS | Status: DC
Start: 2023-11-30 — End: 2023-11-30

## 2023-11-30 MED ORDER — PALONOSETRON HCL INJECTION 0.25 MG/5ML
0.2500 mg | Freq: Once | INTRAVENOUS | Status: AC
  Administered 2023-11-30: 0.25 mg via INTRAVENOUS
  Filled 2023-11-30: qty 5

## 2023-11-30 MED ORDER — DEXAMETHASONE SODIUM PHOSPHATE 10 MG/ML IJ SOLN
10.0000 mg | Freq: Once | INTRAMUSCULAR | Status: AC
  Administered 2023-11-30: 10 mg via INTRAVENOUS
  Filled 2023-11-30: qty 1

## 2023-11-30 MED ORDER — DIPHENHYDRAMINE HCL 50 MG/ML IJ SOLN
50.0000 mg | Freq: Once | INTRAMUSCULAR | Status: AC
  Administered 2023-11-30: 50 mg via INTRAVENOUS
  Filled 2023-11-30: qty 1

## 2023-11-30 MED ORDER — SODIUM CHLORIDE 0.9 % IV SOLN
45.0000 mg/m2 | Freq: Once | INTRAVENOUS | Status: AC
  Administered 2023-11-30: 78 mg via INTRAVENOUS
  Filled 2023-11-30: qty 13

## 2023-11-30 NOTE — Patient Instructions (Signed)
[facility]  Discharge Instructions: Thank you for choosing [facility] to provide your oncology and hematology care.   If you have a lab appointment with the Cancer Center, please go directly to the Cancer Center and check in at the registration area.   Wear comfortable clothing and clothing appropriate for easy access to any Portacath or PICC line.   We strive to give you quality time with your provider. You may need to reschedule your appointment if you arrive late (15 or more minutes).  Arriving late affects you and other patients whose appointments are after yours.  Also, if you miss three or more appointments without notifying the office, you may be dismissed from the clinic at the provider's discretion.      For prescription refill requests, have your pharmacy contact our office and allow 72 hours for refills to be completed.    Today you received the following chemotherapy and/or immunotherapy agents Paclitaxel, Carboplatin   To help prevent nausea and vomiting after your treatment, we encourage you to take your nausea medication as directed.  BELOW ARE SYMPTOMS THAT SHOULD BE REPORTED IMMEDIATELY: *FEVER GREATER THAN 100.4 F (38 C) OR HIGHER *CHILLS OR SWEATING *NAUSEA AND VOMITING THAT IS NOT CONTROLLED WITH YOUR NAUSEA MEDICATION *UNUSUAL SHORTNESS OF BREATH *UNUSUAL BRUISING OR BLEEDING *URINARY PROBLEMS (pain or burning when urinating, or frequent urination) *BOWEL PROBLEMS (unusual diarrhea, constipation, pain near the anus) TENDERNESS IN MOUTH AND THROAT WITH OR WITHOUT PRESENCE OF ULCERS (sore throat, sores in mouth, or a toothache) UNUSUAL RASH, SWELLING OR PAIN  UNUSUAL VAGINAL DISCHARGE OR ITCHING   Items with * indicate a potential emergency and should be followed up as soon as possible or go to the Emergency Department if any problems should occur.  Please show the CHEMOTHERAPY ALERT CARD or  IMMUNOTHERAPY ALERT CARD at check-in to the Emergency Department and triage nurse.  Should you have questions after your visit or need to cancel or reschedule your appointment, please contact [facility]  Dept: [phone]  and follow the prompts.  Office hours are 8:00 a.m. to 4:30 p.m. Monday - Friday. Please note that voicemails left after 4:00 p.m. may not be returned until the following business day.  We are closed weekends and major holidays. You have access to a nurse at all times for urgent questions. Please call the main number to the clinic Dept: [phone] and follow the prompts.   For any non-urgent questions, you may also contact your provider using MyChart. We now offer e-Visits for anyone 1 and older to request care online for non-urgent symptoms. For details visit [email or web address].   Also download the MyChart app! Go to the app store, search "MyChart", open the app, select [place], and log in with your MyChart username and password.

## 2023-11-30 NOTE — Progress Notes (Signed)
[facility] [phone]   [phone]  OFFICE PROGRESS NOTE  Lindsey Mis, MD [address] [place] [address]  DIAGNOSIS: 1) history of stage Ia non-small cell lung cancer, adenocarcinoma the right upper lobe who under went radiation under the care of Dr. Kathrynn Running from 04/23/2022-04/29/22 2 Stage IIIB (T3, N2, M0) non-small cell lung cancer, squamous cell carcinoma. She presented with recurrent tumor at the right lung base, associated right lower lobe metastasis, and right hilar and subcarinal nodal metastases. This was diagnosed in October 2024.   PDL1: 85%   PRIOR THERAPY: Radiation to the right upper lobe under the care of Dr. Kathrynn Running from 04/23/2022-04/29/22   CURRENT THERAPY: Concurrent chemoradiation with carboplatin for an AUC of 2 and paclitaxel 45 mg/m.  First dose on 11/07/23. Status post 3 cycle.  INTERVAL HISTORY: Lindsey Williamson 72 y.o. female returns to the clinic today for follow-up visit.Discussed the use of AI scribe software for clinical note transcription with the patient, who gave verbal consent to proceed.  History of Present Illness   Lindsey Williamson, a 72 year old patient, was diagnosed with stage 3B non-small cell lung cancer in October 2024. The patient is currently undergoing a course of chemotherapy and radiation, with carboplatin and Paclitaxel as the primary agents. The patient has completed three weeks of treatment and is currently in the fourth week. The patient reports tolerating the chemotherapy well, with no significant side effects such as nausea or vomiting. The patient's appetite remains good, and she has been able to maintain her weight.  The patient also reports being able to swallow without difficulty, despite undergoing radiation therapy. However, the patient acknowledges that the radiation therapy has been more challenging to tolerate than the chemotherapy.  In addition to lung cancer,  the patient also has diabetes. The patient reports experiencing significant fatigue and low blood pressure, which resulted in breathlessness after climbing stairs. The patient's diabetes medications include metformin, Jardiance, and a newly added medication starting with "G U L I". The patient reports that her blood sugar levels have improved since starting the new medication, with recent readings around 120, compared to previous readings of 160-300.  The patient also reports mild anemia, which has been contributing to feelings of weakness and breathlessness.       MEDICAL HISTORY: Past Medical History:  Diagnosis Date   Angioedema 10/17/2023   Arthritis    Diabetes mellitus without complication (HCC)    Hypertension     ALLERGIES:  is allergic to benzonatate, lisinopril, aspirin, azithromycin, and codeine.  MEDICATIONS:  Current Outpatient Medications  Medication Sig Dispense Refill   amLODipine (NORVASC) 5 MG tablet Take 5 mg by mouth daily.     amoxicillin-clavulanate (AUGMENTIN) 875-125 MG tablet Take 1 tablet by mouth 2 (two) times daily. 14 tablet 0   aspirin EC 81 MG tablet Take 81 mg by mouth daily. Swallow whole.     azelastine (ASTELIN) 0.1 % nasal spray Place 2 sprays into both nostrils 2 (two) times daily as needed for rhinitis. 30 mL 5   cetirizine (ZYRTEC ALLERGY) 10 MG tablet Take 1 tablet (10 mg total) by mouth daily. 90 tablet 1   diclofenac Sodium (VOLTAREN) 1 % GEL Apply 1 application  topically 2 (two) times daily as needed (Pain).     empagliflozin (JARDIANCE) 25 MG TABS tablet Take 25 mg by mouth daily.     famotidine (PEPCID) 20 MG tablet Take 1 tablet (20  mg total) by mouth 2 (two) times daily. 180 tablet 1   metFORMIN (GLUCOPHAGE-XR) 500 MG 24 hr tablet Take 1,000 mg by mouth 2 (two) times daily.     omeprazole (PRILOSEC) 20 MG capsule Take 1 capsule (20 mg total) by mouth daily. 30 capsule 2   pravastatin (PRAVACHOL) 80 MG tablet Take 80 mg by mouth daily.      prochlorperazine (COMPAZINE) 10 MG tablet TAKE 1 TABLET(10 MG) BY MOUTH EVERY 6 HOURS AS NEEDED 30 tablet 2   No current facility-administered medications for this visit.    SURGICAL HISTORY:  Past Surgical History:  Procedure Laterality Date   BRONCHIAL BIOPSY  03/30/2022   Procedure: BRONCHIAL BIOPSIES;  Surgeon: Josephine Igo, DO;  Location: [facility] ENDOSCOPY;  Service: Pulmonary;;   BRONCHIAL BIOPSY  10/17/2023   Procedure: BRONCHIAL BIOPSIES;  Surgeon: Leslye Peer, MD;  Location: [facility] ENDOSCOPY;  Service: Pulmonary;;   BRONCHIAL BRUSHINGS  03/30/2022   Procedure: BRONCHIAL BRUSHINGS;  Surgeon: Josephine Igo, DO;  Location: [facility] ENDOSCOPY;  Service: Pulmonary;;   BRONCHIAL BRUSHINGS  10/17/2023   Procedure: BRONCHIAL BRUSHINGS;  Surgeon: Leslye Peer, MD;  Location[facility] END[facility]COPY;  Service: Pulmonary;;   BRONCHIAL NEEDLE ASPIRATION BIOPSY  03/30/2022   Procedure: BRONCHIAL NEEDLE ASPIRATION BIOPSIES;  Surgeon: Josephine Igo, DO;  Location: [facility] ENDOSCOPY;  Service: Pulmonary;;   BRONCHIAL NEEDLE ASPIRATION BIOPSY  10/17/2023   Procedure: BRONCHIAL NEEDLE ASPIRATION BIOPSIES;  Surgeon: Leslye Peer, MD;  Location: [facility] ENDOSCOPY;  Service: Pulmonary;;   EYE SURGERY     FIDUCIAL MARKER PLACEMENT  03/30/2022   Procedure: FIDUCIAL MARKER PLACEMENT;  Surgeon: Josephine Igo, DO;  Location: [facility] ENDOSCOPY;  Service: Pulmonary;;   FINE NEEDLE ASPIRATION  10/17/2023   Procedure: FINE NEEDLE ASPIRATION;  Surgeon: Leslye Peer, MD;  Location[facility] END[facility]COPY;  Service: Pulmonary;;   FOREIGN BODY REMOVAL  03/30/2022   Procedure: FOREIGN BODY REMOVAL;  Surgeon: Josephine Igo, DO;  Location: [facility] ENDOSCOPY;  Service: Pulmonary;;   HEMOSTASIS CONTROL  10/17/2023   Procedure: HEMOSTASIS CONTROL;  Surgeon: Leslye Peer, MD;  Location: [facility] ENDOSCOPY;  Service: Pulmonary;;   VIDEO BRONCHOSCOPY WITH ENDOBRONCHIAL ULTRASOUND Bilateral 03/30/2022   Procedure: VIDEO BRONCHOSCOPY WITH ENDOBRONCHIAL  ULTRASOUND;  Surgeon: Josephine Igo, DO;  Location: [facility] ENDOSCOPY;  Service: Pulmonary;  Laterality: Bilateral;   VIDEO BRONCHOSCOPY WITH ENDOBRONCHIAL ULTRASOUND Right 10/17/2023   Procedure: VIDEO BRONCHOSCOPY WITH ENDOBRONCHIAL ULTRASOUND;  Surgeon: Leslye Peer, MD;  Location: [facility] ENDOSCOPY;  Service: Pulmonary;  Laterality: Right;   VIDEO BRONCHOSCOPY WITH RADIAL ENDOBRONCHIAL ULTRASOUND  03/30/2022   Procedure: VIDEO BRONCHOSCOPY WITH RADIAL ENDOBRONCHIAL ULTRASOUND;  Surgeon: Josephine Igo, DO;  Location: [facility] ENDOSCOPY;  Service: Pulmonary;;    REVIEW OF SYSTEMS:  Constitutional: positive for fatigue Eyes: negative Ears, nose, mouth, throat, and face: negative Respiratory: negative Cardiovascular: negative Gastrointestinal: negative Genitourinary:negative Integument/breast: negative Hematologic/lymphatic: negative Musculoskeletal:negative Neurological: negative Behavioral/Psych: negative Endocrine: negative Allergic/Immunologic: negative   PHYSICAL EXAMINATION: General appearance: alert, cooperative, fatigued, and no distress Head: Normocephalic, without obvious abnormality, atraumatic Neck: no adenopathy, no JVD, supple, symmetrical, trachea midline, and thyroid not enlarged, symmetric, no tenderness/mass/nodules Lymph nodes: Cervical, supraclavicular, and axillary nodes normal. Resp: clear to auscultation bilaterally Back: symmetric, no curvature. ROM normal. No CVA tenderness. Cardio: regular rate and rhythm, S1, S2 normal, no murmur, click, rub or gallop GI: soft, non-tender; bowel sounds normal; no masses,  no organomegaly Extremities: extremities normal, atraumatic, no cyanosis or edema Neurologic: Alert and  oriented X 3, normal strength and tone. Normal symmetric reflexes. Normal coordination and gait  ECOG PERFORMANCE STATUS: 1 - Symptomatic but completely ambulatory  Blood pressure (!) 144/81, pulse 92, temperature 97.8 F (36.6 C), temperature source Temporal,  resp. rate 16, height 5\' 4"  (1.626 m), weight 148 lb (67.1 kg), SpO2 99%.  LABORATORY DATA: Lab Results  Component Value Date   WBC 3.4 (L) 11/29/2023   HGB 9.2 (L) 11/29/2023   HCT 28.9 (L) 11/29/2023   MCV 81.2 11/29/2023   PLT 321 11/29/2023      Chemistry      Component Value Date/Time   NA 138 11/29/2023 0824   K 3.5 11/29/2023 0824   CL 106 11/29/2023 0824   CO2 24 11/29/2023 0824   BUN 14 11/29/2023 0824   CREATININE 0.52 11/29/2023 0824      Component Value Date/Time   CALCIUM 9.2 11/29/2023 0824   ALKPHOS 47 11/29/2023 0824   AST 9 (L) 11/29/2023 0824   ALT <5 11/29/2023 0824   BILITOT 0.2 11/29/2023 0824       RADIOGRAPHIC STUDIES: No results found.  ASSESSMENT AND PLAN: This is a very pleasant 72 years old African-American female with: 1) history of stage Ia non-small cell lung cancer, adenocarcinoma the right upper lobe who under went radiation under the care of Dr. Kathrynn Running from 04/23/2022-04/29/22 2 Stage IIIB (T3, N2, M0) non-small cell lung cancer, squamous cell carcinoma. She presented with recurrent tumor at the right lung base, associated right lower lobe metastasis, and right hilar and subcarinal nodal metastases. This was diagnosed in October 2024. The patient is currently undergoing a course of concurrent chemoradiation with weekly carboplatin for AUC of 2 and paclitaxel 45 Mg/M2 status post 3 cycles.    Stage IIIB (T3, N2, M0) Non-Small Cell Lung Cancer, Squamous cell carcinoma. Diagnosed in October 2024. Currently undergoing chemotherapy with carboplatin and paclitaxel, and radiation therapy. Tolerating chemotherapy well with no significant nausea or vomiting. No issues with swallowing at present, but advised to monitor for potential worsening due to radiation. No recent weight loss; slight weight gain noted. Mild anemia present, contributing to weakness and shortness of breath. Discussed potential for cure and need to evaluate post-treatment scans.  Informed that swallowing difficulties may worsen with continued radiation and to contact Dr. Kathrynn Running if issues arise. - Continue chemotherapy and radiation therapy - Monitor for swallowing difficulties and contact Dr. Kathrynn Running if issues arise - Take OTC iron supplements for mild anemia - Evaluate post-treatment scans to assess for potential cure - Follow-up in two weeks  Diabetes Mellitus Managed with metformin, Jardiance, and Glucotrol 2.5 mg. Blood glucose levels have improved recently, with readings of 120 mg/dL in the morning. Steroid use with chemotherapy has contributed to elevated blood glucose levels, but control has improved with current medication regimen. - Continue current diabetes medications including metformin, Jardiance, and Glucotrol - Monitor blood glucose levels regularly - Reassess diabetes management post-chemotherapy and radiation therapy  General Health Maintenance Discussed in the context of ongoing cancer treatment and diabetes management. - Monitor overall health and report any new symptoms or concerns  Follow-up - Follow-up appointment in two weeks.   The patient was advised to call immediately if she has any other concerning symptoms in the interval. The patient voices understanding of current disease status and treatment options and is in agreement with the current care plan.  All questions were answered. The patient knows to call the clinic with any problems, questions or concerns. We can  certainly see the patient much sooner if necessary.  The total time spent in the appointment was 30 minutes.  Disclaimer: This note was dictated with voice recognition software. Similar sounding words can inadvertently be transcribed and may not be corrected upon review.

## 2023-12-01 ENCOUNTER — Other Ambulatory Visit: Payer: Self-pay

## 2023-12-01 ENCOUNTER — Ambulatory Visit
Admission: RE | Admit: 2023-12-01 | Discharge: 2023-12-01 | Disposition: A | Discharge status: Home / Self Care | Payer: Medicare Other | Source: Ambulatory Visit | Attending: Radiation Oncology | Admitting: Radiation Oncology

## 2023-12-01 ENCOUNTER — Encounter: Payer: Self-pay | Admitting: Internal Medicine

## 2023-12-01 ENCOUNTER — Inpatient Hospital Stay: Payer: Medicare Other

## 2023-12-01 DIAGNOSIS — Z510 Encounter for antineoplastic radiation therapy: Secondary | ICD-10-CM | POA: Diagnosis not present

## 2023-12-01 LAB — RAD ONC ARIA SESSION SUMMARY
Course Elapsed Days: 24
Plan Fractions Treated to Date: 17
Plan Prescribed Dose Per Fraction: 2 Gy
Plan Total Fractions Prescribed: 33
Plan Total Prescribed Dose: 66 Gy
Reference Point Dosage Given to Date: 34 Gy
Reference Point Session Dosage Given: 2 Gy
Session Number: 17

## 2023-12-02 ENCOUNTER — Ambulatory Visit
Admission: RE | Admit: 2023-12-02 | Discharge: 2023-12-02 | Disposition: A | Discharge status: Home / Self Care | Payer: Medicare Other | Source: Ambulatory Visit | Attending: Radiation Oncology | Admitting: Radiation Oncology

## 2023-12-02 ENCOUNTER — Other Ambulatory Visit: Payer: Self-pay

## 2023-12-02 ENCOUNTER — Encounter: Payer: Self-pay | Admitting: Internal Medicine

## 2023-12-02 DIAGNOSIS — Z510 Encounter for antineoplastic radiation therapy: Secondary | ICD-10-CM | POA: Diagnosis not present

## 2023-12-02 LAB — RAD ONC ARIA SESSION SUMMARY
Course Elapsed Days: 25
Plan Fractions Treated to Date: 18
Plan Prescribed Dose Per Fraction: 2 Gy
Plan Total Fractions Prescribed: 33
Plan Total Prescribed Dose: 66 Gy
Reference Point Dosage Given to Date: 36 Gy
Reference Point Session Dosage Given: 2 Gy
Session Number: 18

## 2023-12-05 ENCOUNTER — Inpatient Hospital Stay: Payer: Medicare Other

## 2023-12-05 ENCOUNTER — Other Ambulatory Visit: Payer: Self-pay

## 2023-12-05 ENCOUNTER — Ambulatory Visit
Admission: RE | Admit: 2023-12-05 | Discharge: 2023-12-05 | Disposition: A | Discharge status: Home / Self Care | Payer: Medicare Other | Source: Ambulatory Visit | Attending: Radiation Oncology

## 2023-12-05 DIAGNOSIS — Z510 Encounter for antineoplastic radiation therapy: Secondary | ICD-10-CM | POA: Diagnosis not present

## 2023-12-05 LAB — RAD ONC ARIA SESSION SUMMARY
Course Elapsed Days: 28
Plan Fractions Treated to Date: 19
Plan Prescribed Dose Per Fraction: 2 Gy
Plan Total Fractions Prescribed: 33
Plan Total Prescribed Dose: 66 Gy
Reference Point Dosage Given to Date: 38 Gy
Reference Point Session Dosage Given: 2 Gy
Session Number: 19

## 2023-12-06 ENCOUNTER — Inpatient Hospital Stay: Payer: Medicare Other

## 2023-12-06 ENCOUNTER — Ambulatory Visit
Admission: RE | Admit: 2023-12-06 | Discharge: 2023-12-06 | Disposition: A | Discharge status: Home / Self Care | Payer: Medicare Other | Source: Ambulatory Visit | Attending: Radiation Oncology

## 2023-12-06 ENCOUNTER — Other Ambulatory Visit: Payer: Self-pay

## 2023-12-06 VITALS — BP 141/69 | HR 82 | Temp 98.0°F | Resp 16 | Wt 147.5 lb

## 2023-12-06 DIAGNOSIS — C3431 Malignant neoplasm of lower lobe, right bronchus or lung: Secondary | ICD-10-CM

## 2023-12-06 DIAGNOSIS — Z510 Encounter for antineoplastic radiation therapy: Secondary | ICD-10-CM | POA: Diagnosis not present

## 2023-12-06 LAB — CMP (CANCER CENTER ONLY)
ALT: <5 U/L (ref 0–44)
AST: 11 U/L — ABNORMAL LOW (ref 15–41)
Albumin: 3.5 g/dL (ref 3.5–5.0)
Alkaline Phosphatase: 48 U/L (ref 38–126)
Anion gap: 9 (ref 5–15)
BUN: 15 mg/dL (ref 8–23)
CO2: 24 mmol/L (ref 22–32)
Calcium: 9.4 mg/dL (ref 8.9–10.3)
Chloride: 105 mmol/L (ref 98–111)
Creatinine: 0.6 mg/dL (ref 0.44–1.00)
GFR, Estimated: >60 mL/min (ref >60)
Glucose, Bld: 151 mg/dL — ABNORMAL HIGH (ref 70–99)
Potassium: 3.8 mmol/L (ref 3.5–5.1)
Sodium: 138 mmol/L (ref 135–145)
Total Bilirubin: 0.3 mg/dL (ref <1.2)
Total Protein: 7.5 g/dL (ref 6.5–8.1)

## 2023-12-06 LAB — CBC WITH DIFFERENTIAL (CANCER CENTER ONLY)
Abs Immature Granulocytes: 0.02 10*3/uL (ref 0.00–0.07)
Basophils Absolute: 0.1 10*3/uL (ref 0.0–0.1)
Basophils Relative: 1 %
Eosinophils Absolute: 0.2 10*3/uL (ref 0.0–0.5)
Eosinophils Relative: 4 %
HCT: 31.7 % — ABNORMAL LOW (ref 36.0–46.0)
Hemoglobin: 9.5 g/dL — ABNORMAL LOW (ref 12.0–15.0)
Immature Granulocytes: 1 %
Lymphocytes Relative: 9 %
Lymphs Abs: 0.3 10*3/uL — ABNORMAL LOW (ref 0.7–4.0)
MCH: 25.2 pg — ABNORMAL LOW (ref 26.0–34.0)
MCHC: 30 g/dL (ref 30.0–36.0)
MCV: 84.1 fL (ref 80.0–100.0)
Monocytes Absolute: 0.5 10*3/uL (ref 0.1–1.0)
Monocytes Relative: 13 %
Neutro Abs: 2.5 10*3/uL (ref 1.7–7.7)
Neutrophils Relative %: 72 %
Platelet Count: 238 10*3/uL (ref 150–400)
RBC: 3.77 MIL/uL — ABNORMAL LOW (ref 3.87–5.11)
RDW: 18.1 % — ABNORMAL HIGH (ref 11.5–15.5)
WBC Count: 3.5 10*3/uL — ABNORMAL LOW (ref 4.0–10.5)
nRBC: 0 % (ref 0.0–0.2)

## 2023-12-06 LAB — RAD ONC ARIA SESSION SUMMARY
Course Elapsed Days: 29
Plan Fractions Treated to Date: 20
Plan Prescribed Dose Per Fraction: 2 Gy
Plan Total Fractions Prescribed: 33
Plan Total Prescribed Dose: 66 Gy
Reference Point Dosage Given to Date: 40 Gy
Reference Point Session Dosage Given: 2 Gy
Session Number: 20

## 2023-12-06 MED ORDER — DIPHENHYDRAMINE HCL 50 MG/ML IJ SOLN
50.0000 mg | Freq: Once | INTRAMUSCULAR | Status: AC
  Administered 2023-12-06: 50 mg via INTRAVENOUS
  Filled 2023-12-06: qty 1

## 2023-12-06 MED ORDER — PALONOSETRON HCL INJECTION 0.25 MG/5ML
0.2500 mg | Freq: Once | INTRAVENOUS | Status: AC
  Administered 2023-12-06: 0.25 mg via INTRAVENOUS
  Filled 2023-12-06: qty 5

## 2023-12-06 MED ORDER — FAMOTIDINE IN NACL 20-0.9 MG/50ML-% IV SOLN
20.0000 mg | Freq: Once | INTRAVENOUS | Status: AC
  Administered 2023-12-06: 20 mg via INTRAVENOUS
  Filled 2023-12-06: qty 50

## 2023-12-06 MED ORDER — CARBOPLATIN CHEMO INJECTION 450 MG/45ML
160.6000 mg | Freq: Once | INTRAVENOUS | Status: AC
  Administered 2023-12-06: 160 mg via INTRAVENOUS
  Filled 2023-12-06: qty 16

## 2023-12-06 MED ORDER — SODIUM CHLORIDE 0.9 % IV SOLN
45.0000 mg/m2 | Freq: Once | INTRAVENOUS | Status: AC
  Administered 2023-12-06: 78 mg via INTRAVENOUS
  Filled 2023-12-06: qty 13

## 2023-12-06 MED ORDER — SODIUM CHLORIDE 0.9 % IV SOLN
INTRAVENOUS | Status: DC
Start: 2023-12-06 — End: 2023-12-06

## 2023-12-06 MED ORDER — DEXAMETHASONE SODIUM PHOSPHATE 10 MG/ML IJ SOLN
10.0000 mg | Freq: Once | INTRAMUSCULAR | Status: AC
  Administered 2023-12-06: 10 mg via INTRAVENOUS
  Filled 2023-12-06: qty 1

## 2023-12-06 NOTE — Patient Instructions (Signed)

## 2023-12-07 ENCOUNTER — Other Ambulatory Visit: Payer: Self-pay

## 2023-12-07 ENCOUNTER — Ambulatory Visit
Admission: RE | Admit: 2023-12-07 | Discharge: 2023-12-07 | Disposition: A | Discharge status: Home / Self Care | Payer: Medicare Other | Source: Ambulatory Visit | Attending: Radiation Oncology | Admitting: Radiation Oncology

## 2023-12-07 ENCOUNTER — Inpatient Hospital Stay: Payer: Medicare Other

## 2023-12-07 DIAGNOSIS — Z510 Encounter for antineoplastic radiation therapy: Secondary | ICD-10-CM | POA: Diagnosis not present

## 2023-12-07 LAB — RAD ONC ARIA SESSION SUMMARY
Course Elapsed Days: 30
Plan Fractions Treated to Date: 21
Plan Prescribed Dose Per Fraction: 2 Gy
Plan Total Fractions Prescribed: 33
Plan Total Prescribed Dose: 66 Gy
Reference Point Dosage Given to Date: 42 Gy
Reference Point Session Dosage Given: 2 Gy
Session Number: 21

## 2023-12-07 NOTE — Progress Notes (Signed)
[facility] OFFICE PROGRESS NOTE  [name], MD [address] [place] [address]  DIAGNOSIS: 1) history of stage Ia non-small cell lung cancer, adenocarcinoma the right upper lobe who under went radiation under the care of Dr. Kathrynn Running from 04/23/2022-04/29/22 2 Stage IIIa confirm (T3, N2, M0) non-small cell lung cancer, squamous cell carcinoma. She presented with recurrent tumor at the right lung base, associated right lower lobe metastasis, and right hilar and subcarinal nodal metastases. This was diagnosed in October 2024.   PDL1: 85%  PRIOR THERAPY: Radiation to the right upper lobe under the care of Dr. Kathrynn Running from 04/23/2022-04/29/22   CURRENT THERAPY: Concurrent chemoradiation with carboplatin for an AUC of 2 and paclitaxel 45 mg/m.  First dose on 11/07/23. Status post 5 cycle.   INTERVAL HISTORY: Lindsey Williamson 72 y.o. female returns to the clinic today for a follow-up visit.  The patient was seen in the clinic on 11/30/2023.  The patient is currently undergoing treatment with concurrent chemoradiation.  She is status post 5 cycles of treatment and she tolerated it well overall except for she is having a little bit of discomfort with swallowing.   Her last day of radiation is tentatively scheduled for 12/26/23.  She tells me last night that she had an abnormal smell coming from her bellybutton.  She also reports some drainage. She used a Q tip in this area and reports a little bit of bleeding. She denies any itching.  Denies any fevers or chills.  She also mentions that last night she took Robitussin for her cough as well as a cherry popsicle.  She had some streaks of red in her phlegm after coughing and she is not sure if this was blood or from the cherry popsicle or Robitussin.  Her phlegm has been clear otherwise since then.  She denies any significant dyspnea on exertion unless she is going upstairs, but even then she reports her breathing is  improved compared to prior.  She denies any changes in her intermittent cough.  Denies any chest pain.  Denies any nausea, vomiting, diarrhea, or constipation.  She is wondering what type of iron supplement she should take for anemia.  She is here today for evaluation repeat blood work before undergoing cycle #6 on 12/14/23.  MEDICAL HISTORY: Past Medical History:  Diagnosis Date   Angioedema 10/17/2023   Arthritis    Diabetes mellitus without complication (HCC)    Hypertension     ALLERGIES:  is allergic to benzonatate, lisinopril, aspirin, azithromycin, and codeine.  MEDICATIONS:  Current Outpatient Medications  Medication Sig Dispense Refill   amLODipine (NORVASC) 5 MG tablet Take 5 mg by mouth daily.     amoxicillin-clavulanate (AUGMENTIN) 875-125 MG tablet Take 1 tablet by mouth 2 (two) times daily. 14 tablet 0   aspirin EC 81 MG tablet Take 81 mg by mouth daily. Swallow whole.     azelastine (ASTELIN) 0.1 % nasal spray Place 2 sprays into both nostrils 2 (two) times daily as needed for rhinitis. 30 mL 5   cetirizine (ZYRTEC ALLERGY) 10 MG tablet Take 1 tablet (10 mg total) by mouth daily. 90 tablet 1   diclofenac Sodium (VOLTAREN) 1 % GEL Apply 1 application  topically 2 (two) times daily as needed (Pain).     empagliflozin (JARDIANCE) 25 MG TABS tablet Take 25 mg by mouth daily.     famotidine (PEPCID) 20 MG tablet Take 1 tablet (20 mg total) by mouth  2 (two) times daily. 180 tablet 1   metFORMIN (GLUCOPHAGE-XR) 500 MG 24 hr tablet Take 1,000 mg by mouth 2 (two) times daily.     omeprazole (PRILOSEC) 20 MG capsule Take 1 capsule (20 mg total) by mouth daily. 30 capsule 2   pravastatin (PRAVACHOL) 80 MG tablet Take 80 mg by mouth daily.     prochlorperazine (COMPAZINE) 10 MG tablet TAKE 1 TABLET(10 MG) BY MOUTH EVERY 6 HOURS AS NEEDED 30 tablet 2   No current facility-administered medications for this visit.    SURGICAL HISTORY:  Past Surgical History:  Procedure Laterality  Date   BRONCHIAL BIOPSY  03/30/2022   Procedure: BRONCHIAL BIOPSIES;  Surgeon: Josephine Igo, DO;  Location: [facility] ENDOSCOPY;  Service: Pulmonary;;   BRONCHIAL BIOPSY  10/17/2023   Procedure: BRONCHIAL BIOPSIES;  Surgeon: Leslye Peer, MD;  Location: [facility] ENDOSCOPY;  Service: Pulmonary;;   BRONCHIAL BRUSHINGS  03/30/2022   Procedure: BRONCHIAL BRUSHINGS;  Surgeon: Josephine Igo, DO;  Location: [facility] ENDOSCOPY;  Service: Pulmonary;;   BRONCHIAL BRUSHINGS  10/17/2023   Procedure: BRONCHIAL BRUSHINGS;  Surgeon: Leslye Peer, MD;  Location: [facility] ENDOSCOPY;  Service: Pulmonary;;   BRONCHIAL NEEDLE ASPIRATION BIOPSY  03/30/2022   Procedure: BRONCHIAL NEEDLE ASPIRATION BIOPSIES;  Surgeon: Josephine Igo, DO;  Location: [facility] ENDOSCOPY;  Service: Pulmonary;;   BRONCHIAL NEEDLE ASPIRATION BIOPSY  10/17/2023   Procedure: BRONCHIAL NEEDLE ASPIRATION BIOPSIES;  Surgeon: Leslye Peer, MD;  Location: [facility] ENDOSCOPY;  Service: Pulmonary;;   EYE SURGERY     FIDUCIAL MARKER PLACEMENT  03/30/2022   Procedure: FIDUCIAL MARKER PLACEMENT;  Surgeon: Josephine Igo, DO;  Location: [facility] ENDOSCOPY;  Service: Pulmonary;;   FINE NEEDLE ASPIRATION  10/17/2023   Procedure: FINE NEEDLE ASPIRATION;  Surgeon: Leslye Peer, MD;  Location: [facility] ENDOSCOPY;  Service: Pulmonary;;   FOREIGN BODY REMOVAL  03/30/2022   Procedure: FOREIGN BODY REMOVAL;  Surgeon: Josephine Igo, DO;  Location: [facility] ENDOSCOPY;  Service: Pulmonary;;   HEMOSTASIS CONTROL  10/17/2023   Procedure: HEMOSTASIS CONTROL;  Surgeon: Leslye Peer, MD;  Location: [facility] ENDOSCOPY;  Service: Pulmonary;;   VIDEO BRONCHOSCOPY WITH ENDOBRONCHIAL ULTRASOUND Bilateral 03/30/2022   Procedure: VIDEO BRONCHOSCOPY WITH ENDOBRONCHIAL ULTRASOUND;  Surgeon: Josephine Igo, DO;  Location: [facility] ENDOSCOPY;  Service: Pulmonary;  Laterality: Bilateral;   VIDEO BRONCHOSCOPY WITH ENDOBRONCHIAL ULTRASOUND Right 10/17/2023   Procedure: VIDEO BRONCHOSCOPY WITH ENDOBRONCHIAL ULTRASOUND;   Surgeon: Leslye Peer, MD;  Location: [facility] ENDOSCOPY;  Service: Pulmonary;  Laterality: Right;   VIDEO BRONCHOSCOPY WITH RADIAL ENDOBRONCHIAL ULTRASOUND  03/30/2022   Procedure: VIDEO BRONCHOSCOPY WITH RADIAL ENDOBRONCHIAL ULTRASOUND;  Surgeon: Josephine Igo, DO;  Location: [facility] ENDOSCOPY;  Service: Pulmonary;;    REVIEW OF SYSTEMS:   Review of Systems  Constitutional: Negative for appetite change, chills, fatigue, fever and unexpected weight change.  HENT:  Positive for mild esophagitis. Negative for mouth sores, nosebleeds, sore throat and trouble swallowing.   Eyes: Negative for eye problems and icterus.  Respiratory: Positive for cough. May experience dyspnea on exertion with going up stairs. Negative hemoptysis and wheezing.   Cardiovascular: Negative for chest pain and leg swelling.  Gastrointestinal: Negative for abdominal pain, constipation, diarrhea, nausea and vomiting.  Genitourinary: Negative for bladder incontinence, difficulty urinating, dysuria, frequency and hematuria.   Musculoskeletal: Negative for back pain, gait problem, neck pain and neck stiffness.  Skin: Negative for itching and rash.  Neurological: Negative for dizziness, extremity weakness, gait problem, headaches, light-headedness and seizures.  Hematological: Negative for adenopathy. Does not bruise/bleed easily.  Psychiatric/Behavioral: Negative for confusion, depression and sleep disturbance. The patient is not nervous/anxious.     PHYSICAL EXAMINATION:  There were no vitals taken for this visit.  ECOG PERFORMANCE STATUS: 1  Physical Exam  Constitutional: Oriented to person, place, and time and well-developed, well-nourished, and in no distress.  HENT:  Head: Normocephalic and atraumatic.  Mouth/Throat: Oropharynx is clear and moist. No oropharyngeal exudate.  Eyes: Conjunctivae are normal. Right eye exhibits no discharge. Left eye exhibits no discharge. No scleral icterus.  Neck: Normal range of motion.  Neck supple.  Cardiovascular: Normal rate, regular rhythm, normal heart sounds and intact distal pulses.   Pulmonary/Chest: Effort normal and breath sounds normal. No respiratory distress. No wheezes. No rales.  Abdominal: Soft. Bowel sounds are normal. Exhibits no distension and no mass. There is no tenderness.  Musculoskeletal: Normal range of motion. Exhibits no edema.  Lymphadenopathy:    No cervical adenopathy.  Neurological: Alert and oriented to person, place, and time. Exhibits normal muscle tone. Gait normal. Coordination normal.  Skin: Skin is warm and dry. No rash noted. Not diaphoretic. No erythema. No pallor. No drainage or bleeding from umbilicus Psychiatric: Mood, memory and judgment normal.  Vitals reviewed.  LABORATORY DATA: Lab Results  Component Value Date   WBC 3.5 (L) 12/06/2023   HGB 9.5 (L) 12/06/2023   HCT 31.7 (L) 12/06/2023   MCV 84.1 12/06/2023   PLT 238 12/06/2023      Chemistry      Component Value Date/Time   NA 138 12/06/2023 0949   K 3.8 12/06/2023 0949   CL 105 12/06/2023 0949   CO2 24 12/06/2023 0949   BUN 15 12/06/2023 0949   CREATININE 0.60 12/06/2023 0949      Component Value Date/Time   CALCIUM 9.4 12/06/2023 0949   ALKPHOS 48 12/06/2023 0949   AST 11 (L) 12/06/2023 0949   ALT <5 12/06/2023 0949   BILITOT 0.3 12/06/2023 0949       RADIOGRAPHIC STUDIES:  No results found.   ASSESSMENT/PLAN:  1) history of stage Ia non-small cell lung cancer, adenocarcinoma the right upper lobe who under went radiation under the care of Dr. Kathrynn Running from 04/23/2022-04/29/22 2 Stage IIIa confirm (T3, N2, M0) non-small cell lung cancer, squamous cell carcinoma. She presented with recurrent tumor at the right lung base, associated right lower lobe metastasis, and right hilar and subcarinal nodal metastases. This was diagnosed in October 2024.    PDL1 expression 85%.    The patient is undergoing concurrent chemoradiation with carboplatin for an AUC of 2  and paclitaxel 45 mg/m weekly with concurrent radiation.  The patient is interested in this option and first dose on 11/07/23. She is status post 5 cycles.    Labs were reviewed. Recommend she proceed with cycle #6 12/14/23 as scheduled.    Her last chemotherapy will be next week on 12/24 as long as her labs permit.   I will order a restaging CT scan of the chest to be performed about 3 weeks after completion of radiation.   We will then see her the following week to review the scan and discuss next steps.   Unclear if the streaks of red in her phlegm yesterday were from Robitussin, her cherry popsicle, or blood.  She will monitor closely.  The patient was cautioned if she ever develops significant or frank Hemoptysis that that would warrant emergency room evaluation.  She will continue taking Robitussin  for her cough.  Her cough is controlled.   Regarding the abnormal smell and drainage from her umbilicus yesterday, her umbilicus is very deep.  Question whether she has some yeast or beginning of skin bacterial infection.  Therefore, I sent her nystatin powder and doxycycline.   Reviewed different brands of iron to pick up over-the-counter.  I also sent her Carafate for her mild esophagitis.  The patient was advised to call immediately if she has any concerning symptoms in the interval. The patient voices understanding of current disease status and treatment options and is in agreement with the current care plan. All questions were answered. The patient knows to call the clinic with any problems, questions or concerns. We can certainly see the patient much sooner if necessary    No orders of the defined types were placed in this encounter.    The total time spent in the appointment was 20-29 minutes  Jerusalem Wert L Ninette Cotta, PA-C 12/07/23

## 2023-12-08 ENCOUNTER — Ambulatory Visit
Admission: RE | Admit: 2023-12-08 | Discharge: 2023-12-08 | Disposition: A | Discharge status: Home / Self Care | Payer: Medicare Other | Source: Ambulatory Visit | Attending: Radiation Oncology | Admitting: Radiation Oncology

## 2023-12-08 ENCOUNTER — Other Ambulatory Visit: Payer: Self-pay

## 2023-12-08 ENCOUNTER — Inpatient Hospital Stay: Payer: Medicare Other

## 2023-12-08 DIAGNOSIS — Z510 Encounter for antineoplastic radiation therapy: Secondary | ICD-10-CM | POA: Diagnosis not present

## 2023-12-08 LAB — RAD ONC ARIA SESSION SUMMARY
Course Elapsed Days: 31
Plan Fractions Treated to Date: 22
Plan Prescribed Dose Per Fraction: 2 Gy
Plan Total Fractions Prescribed: 33
Plan Total Prescribed Dose: 66 Gy
Reference Point Dosage Given to Date: 44 Gy
Reference Point Session Dosage Given: 2 Gy
Session Number: 22

## 2023-12-09 ENCOUNTER — Other Ambulatory Visit: Payer: Self-pay

## 2023-12-09 ENCOUNTER — Ambulatory Visit
Admission: RE | Admit: 2023-12-09 | Discharge: 2023-12-09 | Disposition: A | Discharge status: Home / Self Care | Payer: Medicare Other | Source: Ambulatory Visit | Attending: Radiation Oncology

## 2023-12-09 ENCOUNTER — Other Ambulatory Visit: Payer: Medicare Other

## 2023-12-09 ENCOUNTER — Ambulatory Visit: Payer: Medicare Other | Admitting: Physician Assistant

## 2023-12-09 DIAGNOSIS — Z510 Encounter for antineoplastic radiation therapy: Secondary | ICD-10-CM | POA: Diagnosis not present

## 2023-12-09 LAB — RAD ONC ARIA SESSION SUMMARY
Course Elapsed Days: 32
Plan Fractions Treated to Date: 23
Plan Prescribed Dose Per Fraction: 2 Gy
Plan Total Fractions Prescribed: 33
Plan Total Prescribed Dose: 66 Gy
Reference Point Dosage Given to Date: 46 Gy
Reference Point Session Dosage Given: 2 Gy
Session Number: 23

## 2023-12-12 ENCOUNTER — Inpatient Hospital Stay: Payer: Medicare Other

## 2023-12-12 ENCOUNTER — Ambulatory Visit
Admission: RE | Admit: 2023-12-12 | Discharge: 2023-12-12 | Disposition: A | Discharge status: Home / Self Care | Payer: Medicare Other | Source: Ambulatory Visit | Attending: Radiation Oncology | Admitting: Radiation Oncology

## 2023-12-12 ENCOUNTER — Other Ambulatory Visit: Payer: Self-pay

## 2023-12-12 ENCOUNTER — Inpatient Hospital Stay (HOSPITAL_BASED_OUTPATIENT_CLINIC_OR_DEPARTMENT_OTHER): Payer: Medicare Other | Admitting: Physician Assistant

## 2023-12-12 VITALS — BP 139/67 | HR 95 | Temp 97.2°F | Resp 16 | Wt 148.6 lb

## 2023-12-12 DIAGNOSIS — C3431 Malignant neoplasm of lower lobe, right bronchus or lung: Secondary | ICD-10-CM

## 2023-12-12 DIAGNOSIS — Z5111 Encounter for antineoplastic chemotherapy: Secondary | ICD-10-CM | POA: Diagnosis not present

## 2023-12-12 DIAGNOSIS — Z510 Encounter for antineoplastic radiation therapy: Secondary | ICD-10-CM | POA: Diagnosis not present

## 2023-12-12 LAB — CMP (CANCER CENTER ONLY)
ALT: <5 U/L (ref 0–44)
AST: 10 U/L — ABNORMAL LOW (ref 15–41)
Albumin: 3.5 g/dL (ref 3.5–5.0)
Alkaline Phosphatase: 43 U/L (ref 38–126)
Anion gap: 8 (ref 5–15)
BUN: 19 mg/dL (ref 8–23)
CO2: 24 mmol/L (ref 22–32)
Calcium: 8.9 mg/dL (ref 8.9–10.3)
Chloride: 107 mmol/L (ref 98–111)
Creatinine: 0.58 mg/dL (ref 0.44–1.00)
GFR, Estimated: >60 mL/min (ref >60)
Glucose, Bld: 127 mg/dL — ABNORMAL HIGH (ref 70–99)
Potassium: 3.8 mmol/L (ref 3.5–5.1)
Sodium: 139 mmol/L (ref 135–145)
Total Bilirubin: 0.2 mg/dL (ref <1.2)
Total Protein: 7 g/dL (ref 6.5–8.1)

## 2023-12-12 LAB — CBC WITH DIFFERENTIAL (CANCER CENTER ONLY)
Abs Immature Granulocytes: 0.02 10*3/uL (ref 0.00–0.07)
Basophils Absolute: 0 10*3/uL (ref 0.0–0.1)
Basophils Relative: 1 %
Eosinophils Absolute: 0.1 10*3/uL (ref 0.0–0.5)
Eosinophils Relative: 4 %
HCT: 31.1 % — ABNORMAL LOW (ref 36.0–46.0)
Hemoglobin: 9.9 g/dL — ABNORMAL LOW (ref 12.0–15.0)
Immature Granulocytes: 1 %
Lymphocytes Relative: 11 %
Lymphs Abs: 0.3 10*3/uL — ABNORMAL LOW (ref 0.7–4.0)
MCH: 26.3 pg (ref 26.0–34.0)
MCHC: 31.8 g/dL (ref 30.0–36.0)
MCV: 82.7 fL (ref 80.0–100.0)
Monocytes Absolute: 0.3 10*3/uL (ref 0.1–1.0)
Monocytes Relative: 13 %
Neutro Abs: 1.9 10*3/uL (ref 1.7–7.7)
Neutrophils Relative %: 70 %
Platelet Count: 214 10*3/uL (ref 150–400)
RBC: 3.76 MIL/uL — ABNORMAL LOW (ref 3.87–5.11)
RDW: 19.8 % — ABNORMAL HIGH (ref 11.5–15.5)
WBC Count: 2.6 10*3/uL — ABNORMAL LOW (ref 4.0–10.5)
nRBC: 0 % (ref 0.0–0.2)

## 2023-12-12 LAB — RAD ONC ARIA SESSION SUMMARY
Course Elapsed Days: 35
Plan Fractions Treated to Date: 24
Plan Prescribed Dose Per Fraction: 2 Gy
Plan Total Fractions Prescribed: 33
Plan Total Prescribed Dose: 66 Gy
Reference Point Dosage Given to Date: 48 Gy
Reference Point Session Dosage Given: 2 Gy
Session Number: 24

## 2023-12-12 MED ORDER — NYSTATIN 100000 UNIT/GM EX POWD: 1.0000 | Freq: Three times a day (TID) | CUTANEOUS | 0 refills | Status: DC

## 2023-12-12 MED ORDER — SUCRALFATE 1 G PO TABS: 1.0000 g | ORAL_TABLET | Freq: Three times a day (TID) | ORAL | 0 refills | Status: DC

## 2023-12-12 MED ORDER — DOXYCYCLINE HYCLATE 100 MG PO TABS: 100.0000 mg | ORAL_TABLET | Freq: Two times a day (BID) | ORAL | 0 refills | Status: DC

## 2023-12-13 ENCOUNTER — Other Ambulatory Visit: Payer: Self-pay

## 2023-12-13 ENCOUNTER — Ambulatory Visit
Admission: RE | Admit: 2023-12-13 | Discharge: 2023-12-13 | Disposition: A | Discharge status: Home / Self Care | Payer: Medicare Other | Source: Ambulatory Visit | Attending: Radiation Oncology | Admitting: Radiation Oncology

## 2023-12-13 DIAGNOSIS — Z510 Encounter for antineoplastic radiation therapy: Secondary | ICD-10-CM | POA: Diagnosis not present

## 2023-12-13 LAB — RAD ONC ARIA SESSION SUMMARY
Course Elapsed Days: 36
Plan Fractions Treated to Date: 25
Plan Prescribed Dose Per Fraction: 2 Gy
Plan Total Fractions Prescribed: 33
Plan Total Prescribed Dose: 66 Gy
Reference Point Dosage Given to Date: 50 Gy
Reference Point Session Dosage Given: 2 Gy
Session Number: 25

## 2023-12-14 ENCOUNTER — Telehealth: Payer: Self-pay

## 2023-12-14 ENCOUNTER — Ambulatory Visit: Payer: Medicare Other

## 2023-12-14 ENCOUNTER — Ambulatory Visit
Admission: RE | Admit: 2023-12-14 | Discharge: 2023-12-14 | Disposition: A | Discharge status: Home / Self Care | Payer: Medicare Other | Source: Ambulatory Visit | Attending: Radiation Oncology | Admitting: Radiation Oncology

## 2023-12-14 ENCOUNTER — Other Ambulatory Visit: Payer: Self-pay

## 2023-12-14 ENCOUNTER — Inpatient Hospital Stay: Payer: Medicare Other

## 2023-12-14 ENCOUNTER — Telehealth: Payer: Self-pay | Admitting: Medical Oncology

## 2023-12-14 VITALS — BP 129/63 | HR 90 | Temp 98.1°F | Resp 17

## 2023-12-14 DIAGNOSIS — Z510 Encounter for antineoplastic radiation therapy: Secondary | ICD-10-CM | POA: Diagnosis not present

## 2023-12-14 DIAGNOSIS — C3431 Malignant neoplasm of lower lobe, right bronchus or lung: Secondary | ICD-10-CM

## 2023-12-14 LAB — RAD ONC ARIA SESSION SUMMARY
Course Elapsed Days: 37
Plan Fractions Treated to Date: 26
Plan Prescribed Dose Per Fraction: 2 Gy
Plan Total Fractions Prescribed: 33
Plan Total Prescribed Dose: 66 Gy
Reference Point Dosage Given to Date: 52 Gy
Reference Point Session Dosage Given: 2 Gy
Session Number: 26

## 2023-12-14 MED ORDER — DIPHENHYDRAMINE HCL 50 MG/ML IJ SOLN
50.0000 mg | Freq: Once | INTRAMUSCULAR | Status: AC
  Administered 2023-12-14: 50 mg via INTRAVENOUS
  Filled 2023-12-14: qty 1

## 2023-12-14 MED ORDER — SODIUM CHLORIDE 0.9 % IV SOLN
160.6000 mg | Freq: Once | INTRAVENOUS | Status: AC
  Administered 2023-12-14: 160 mg via INTRAVENOUS
  Filled 2023-12-14: qty 16

## 2023-12-14 MED ORDER — PALONOSETRON HCL INJECTION 0.25 MG/5ML
0.2500 mg | Freq: Once | INTRAVENOUS | Status: AC
  Administered 2023-12-14: 0.25 mg via INTRAVENOUS
  Filled 2023-12-14: qty 5

## 2023-12-14 MED ORDER — FAMOTIDINE IN NACL 20-0.9 MG/50ML-% IV SOLN
20.0000 mg | Freq: Once | INTRAVENOUS | Status: AC
  Administered 2023-12-14: 20 mg via INTRAVENOUS
  Filled 2023-12-14: qty 50

## 2023-12-14 MED ORDER — DEXAMETHASONE SODIUM PHOSPHATE 10 MG/ML IJ SOLN
10.0000 mg | Freq: Once | INTRAMUSCULAR | Status: AC
Start: 2023-12-14 — End: 2023-12-14
  Administered 2023-12-14: 10 mg via INTRAVENOUS
  Filled 2023-12-14: qty 1

## 2023-12-14 MED ORDER — SODIUM CHLORIDE 0.9 % IV SOLN
45.0000 mg/m2 | Freq: Once | INTRAVENOUS | Status: AC
  Administered 2023-12-14: 78 mg via INTRAVENOUS
  Filled 2023-12-14: qty 13

## 2023-12-14 MED ORDER — SODIUM CHLORIDE 0.9 % IV SOLN: INTRAVENOUS | Status: DC

## 2023-12-14 NOTE — Telephone Encounter (Signed)
Pt seen in infusion .F/u   "spitting up blood". She is in NAD and her VSS .  She mentioned that she had dental /gum problems and has not seen dentist as planned due to being diagnosed with cancer. Her teeth and gums are in poor condition.   Today she put some cotton between her gum and upper right teeth and when she pulled it out there was some blood on it. I told her to monitor it .   She cancelled her xrt earlier this , but now said she is willing to get treatment. She is scheduled for 1215 to be worked in by linac 4.Marland Kitchen

## 2023-12-14 NOTE — Telephone Encounter (Signed)
RN spoke with patient as she was currently getting chemotherapy treatment.  She had spoke to nurse in medonc who told her it was ok to get radiation treatment that the blood she was spitting out may be related to gum/dental care.  Lindsey Williamson had called earlier to cancel radiation treatment so they will now have to work her in this afternoon 12:15-12:45 pm as there is one machine down.

## 2023-12-14 NOTE — Telephone Encounter (Signed)
"  Spitting up blood" .  She said it might be from her gums when she brushed her teeth.  Denies black , tarry stools,and no  N/V. She denied coughing up blood.  I heard her cough on the phone. She coughed up sputum and said it was clear.  I told her to keep her appt this am.

## 2023-12-15 ENCOUNTER — Ambulatory Visit
Admission: RE | Admit: 2023-12-15 | Discharge: 2023-12-15 | Disposition: A | Discharge status: Home / Self Care | Payer: Medicare Other | Source: Ambulatory Visit | Attending: Radiation Oncology | Admitting: Radiation Oncology

## 2023-12-15 ENCOUNTER — Telehealth: Payer: Self-pay | Admitting: Medical Oncology

## 2023-12-15 ENCOUNTER — Other Ambulatory Visit: Payer: Self-pay

## 2023-12-15 DIAGNOSIS — Z510 Encounter for antineoplastic radiation therapy: Secondary | ICD-10-CM | POA: Diagnosis not present

## 2023-12-15 DIAGNOSIS — C3431 Malignant neoplasm of lower lobe, right bronchus or lung: Secondary | ICD-10-CM

## 2023-12-15 LAB — RAD ONC ARIA SESSION SUMMARY
Course Elapsed Days: 38
Plan Fractions Treated to Date: 27
Plan Prescribed Dose Per Fraction: 2 Gy
Plan Total Fractions Prescribed: 33
Plan Total Prescribed Dose: 66 Gy
Reference Point Dosage Given to Date: 54 Gy
Reference Point Session Dosage Given: 2 Gy
Session Number: 27

## 2023-12-15 MED ORDER — NYSTATIN 100000 UNIT/GM EX POWD: 1.0000 | Freq: Three times a day (TID) | CUTANEOUS | 0 refills | Status: DC

## 2023-12-15 MED ORDER — DOXYCYCLINE HYCLATE 100 MG PO TABS: 100.0000 mg | ORAL_TABLET | Freq: Two times a day (BID) | ORAL | 0 refills | Status: DC

## 2023-12-15 NOTE — Telephone Encounter (Signed)
Spoke to "Fuzan"?  I told him to cancel her rx doxycycline and nystatin. These rx were re-routed to [facility].

## 2023-12-16 ENCOUNTER — Ambulatory Visit
Admission: RE | Admit: 2023-12-16 | Discharge: 2023-12-16 | Disposition: A | Discharge status: Home / Self Care | Payer: Medicare Other | Source: Ambulatory Visit | Attending: Radiation Oncology | Admitting: Radiation Oncology

## 2023-12-16 ENCOUNTER — Other Ambulatory Visit: Payer: Self-pay

## 2023-12-16 DIAGNOSIS — Z510 Encounter for antineoplastic radiation therapy: Secondary | ICD-10-CM | POA: Diagnosis not present

## 2023-12-16 LAB — RAD ONC ARIA SESSION SUMMARY
Course Elapsed Days: 39
Plan Fractions Treated to Date: 28
Plan Prescribed Dose Per Fraction: 2 Gy
Plan Total Fractions Prescribed: 33
Plan Total Prescribed Dose: 66 Gy
Reference Point Dosage Given to Date: 56 Gy
Reference Point Session Dosage Given: 2 Gy
Session Number: 28

## 2023-12-18 ENCOUNTER — Other Ambulatory Visit: Payer: Self-pay

## 2023-12-19 ENCOUNTER — Other Ambulatory Visit: Payer: Self-pay

## 2023-12-19 ENCOUNTER — Ambulatory Visit
Admission: RE | Admit: 2023-12-19 | Discharge: 2023-12-19 | Disposition: A | Discharge status: Home / Self Care | Payer: Medicare Other | Source: Ambulatory Visit | Attending: Radiation Oncology

## 2023-12-19 ENCOUNTER — Inpatient Hospital Stay: Payer: Medicare Other

## 2023-12-19 ENCOUNTER — Ambulatory Visit: Payer: Medicare Other

## 2023-12-19 DIAGNOSIS — C3431 Malignant neoplasm of lower lobe, right bronchus or lung: Secondary | ICD-10-CM

## 2023-12-19 DIAGNOSIS — Z510 Encounter for antineoplastic radiation therapy: Secondary | ICD-10-CM | POA: Diagnosis not present

## 2023-12-19 LAB — CBC WITH DIFFERENTIAL (CANCER CENTER ONLY)
Abs Immature Granulocytes: 0 10*3/uL (ref 0.00–0.07)
Basophils Absolute: 0 10*3/uL (ref 0.0–0.1)
Basophils Relative: 1 %
Eosinophils Absolute: 0.1 10*3/uL (ref 0.0–0.5)
Eosinophils Relative: 3 %
HCT: 31.1 % — ABNORMAL LOW (ref 36.0–46.0)
Hemoglobin: 9.5 g/dL — ABNORMAL LOW (ref 12.0–15.0)
Immature Granulocytes: 0 %
Lymphocytes Relative: 17 %
Lymphs Abs: 0.3 10*3/uL — ABNORMAL LOW (ref 0.7–4.0)
MCH: 25.7 pg — ABNORMAL LOW (ref 26.0–34.0)
MCHC: 30.5 g/dL (ref 30.0–36.0)
MCV: 84.1 fL (ref 80.0–100.0)
Monocytes Absolute: 0.2 10*3/uL (ref 0.1–1.0)
Monocytes Relative: 12 %
Neutro Abs: 1.3 10*3/uL — ABNORMAL LOW (ref 1.7–7.7)
Neutrophils Relative %: 67 %
Platelet Count: 266 10*3/uL (ref 150–400)
RBC: 3.7 MIL/uL — ABNORMAL LOW (ref 3.87–5.11)
RDW: 21.6 % — ABNORMAL HIGH (ref 11.5–15.5)
WBC Count: 1.9 10*3/uL — ABNORMAL LOW (ref 4.0–10.5)
nRBC: 0 % (ref 0.0–0.2)

## 2023-12-19 LAB — CMP (CANCER CENTER ONLY)
ALT: <5 U/L (ref 0–44)
AST: 10 U/L — ABNORMAL LOW (ref 15–41)
Albumin: 3.5 g/dL (ref 3.5–5.0)
Alkaline Phosphatase: 45 U/L (ref 38–126)
Anion gap: 7 (ref 5–15)
BUN: 17 mg/dL (ref 8–23)
CO2: 25 mmol/L (ref 22–32)
Calcium: 9.1 mg/dL (ref 8.9–10.3)
Chloride: 107 mmol/L (ref 98–111)
Creatinine: 0.64 mg/dL (ref 0.44–1.00)
GFR, Estimated: >60 mL/min (ref >60)
Glucose, Bld: 137 mg/dL — ABNORMAL HIGH (ref 70–99)
Potassium: 3.7 mmol/L (ref 3.5–5.1)
Sodium: 139 mmol/L (ref 135–145)
Total Bilirubin: 0.3 mg/dL (ref <1.2)
Total Protein: 6.9 g/dL (ref 6.5–8.1)

## 2023-12-19 LAB — RAD ONC ARIA SESSION SUMMARY
Course Elapsed Days: 42
Plan Fractions Treated to Date: 29
Plan Prescribed Dose Per Fraction: 2 Gy
Plan Total Fractions Prescribed: 33
Plan Total Prescribed Dose: 66 Gy
Reference Point Dosage Given to Date: 58 Gy
Reference Point Session Dosage Given: 2 Gy
Session Number: 29

## 2023-12-20 ENCOUNTER — Other Ambulatory Visit: Payer: Self-pay

## 2023-12-20 ENCOUNTER — Ambulatory Visit: Payer: Medicare Other

## 2023-12-20 ENCOUNTER — Ambulatory Visit
Admission: RE | Admit: 2023-12-20 | Discharge: 2023-12-20 | Disposition: A | Discharge status: Home / Self Care | Payer: Medicare Other | Source: Ambulatory Visit | Attending: Radiation Oncology | Admitting: Radiation Oncology

## 2023-12-20 ENCOUNTER — Inpatient Hospital Stay: Payer: Medicare Other

## 2023-12-20 ENCOUNTER — Encounter: Payer: Self-pay | Admitting: Internal Medicine

## 2023-12-20 VITALS — BP 125/76 | HR 94 | Temp 97.8°F | Resp 18 | Wt 148.2 lb

## 2023-12-20 DIAGNOSIS — Z510 Encounter for antineoplastic radiation therapy: Secondary | ICD-10-CM | POA: Diagnosis not present

## 2023-12-20 DIAGNOSIS — C3431 Malignant neoplasm of lower lobe, right bronchus or lung: Secondary | ICD-10-CM

## 2023-12-20 LAB — RAD ONC ARIA SESSION SUMMARY
Course Elapsed Days: 43
Plan Fractions Treated to Date: 30
Plan Prescribed Dose Per Fraction: 2 Gy
Plan Total Fractions Prescribed: 33
Plan Total Prescribed Dose: 66 Gy
Reference Point Dosage Given to Date: 60 Gy
Reference Point Session Dosage Given: 2 Gy
Session Number: 30

## 2023-12-20 MED ORDER — FAMOTIDINE IN NACL 20-0.9 MG/50ML-% IV SOLN
20.0000 mg | Freq: Once | INTRAVENOUS | Status: AC
Start: 2023-12-20 — End: 2023-12-20
  Administered 2023-12-20: 20 mg via INTRAVENOUS
  Filled 2023-12-20: qty 50

## 2023-12-20 MED ORDER — CARBOPLATIN CHEMO INJECTION 450 MG/45ML
160.6000 mg | Freq: Once | INTRAVENOUS | Status: AC
  Administered 2023-12-20: 160 mg via INTRAVENOUS
  Filled 2023-12-20: qty 16

## 2023-12-20 MED ORDER — DEXAMETHASONE SODIUM PHOSPHATE 10 MG/ML IJ SOLN
10.0000 mg | Freq: Once | INTRAMUSCULAR | Status: AC
  Administered 2023-12-20: 10 mg via INTRAVENOUS
  Filled 2023-12-20: qty 1

## 2023-12-20 MED ORDER — SODIUM CHLORIDE 0.9 % IV SOLN
45.0000 mg/m2 | Freq: Once | INTRAVENOUS | Status: AC
  Administered 2023-12-20: 78 mg via INTRAVENOUS
  Filled 2023-12-20: qty 13

## 2023-12-20 MED ORDER — HEPARIN SOD (PORK) LOCK FLUSH 100 UNIT/ML IV SOLN: 500.0000 [IU] | Freq: Once | INTRAVENOUS | Status: DC | PRN

## 2023-12-20 MED ORDER — DIPHENHYDRAMINE HCL 50 MG/ML IJ SOLN
50.0000 mg | Freq: Once | INTRAMUSCULAR | Status: AC
Start: 2023-12-20 — End: 2023-12-20
  Administered 2023-12-20: 50 mg via INTRAVENOUS
  Filled 2023-12-20: qty 1

## 2023-12-20 MED ORDER — PALONOSETRON HCL INJECTION 0.25 MG/5ML
0.2500 mg | Freq: Once | INTRAVENOUS | Status: AC
Start: 2023-12-20 — End: 2023-12-20
  Administered 2023-12-20: 0.25 mg via INTRAVENOUS
  Filled 2023-12-20: qty 5

## 2023-12-20 MED ORDER — SODIUM CHLORIDE 0.9% FLUSH: 10.0000 mL | INTRAVENOUS | Status: DC | PRN

## 2023-12-20 MED ORDER — SODIUM CHLORIDE 0.9 % IV SOLN: INTRAVENOUS | Status: DC

## 2023-12-20 NOTE — Progress Notes (Signed)
Per Cassie Heilingoetter, PA ok for treatment today with ANC 1.3

## 2023-12-20 NOTE — Patient Instructions (Signed)

## 2023-12-21 ENCOUNTER — Other Ambulatory Visit: Payer: Self-pay

## 2023-12-22 ENCOUNTER — Other Ambulatory Visit: Payer: Self-pay

## 2023-12-22 ENCOUNTER — Ambulatory Visit
Admission: RE | Admit: 2023-12-22 | Discharge: 2023-12-22 | Disposition: A | Discharge status: Home / Self Care | Payer: Medicare Other | Source: Ambulatory Visit | Attending: Radiation Oncology | Admitting: Radiation Oncology

## 2023-12-22 DIAGNOSIS — Z510 Encounter for antineoplastic radiation therapy: Secondary | ICD-10-CM | POA: Diagnosis not present

## 2023-12-22 LAB — RAD ONC ARIA SESSION SUMMARY
Course Elapsed Days: 45
Plan Fractions Treated to Date: 31
Plan Prescribed Dose Per Fraction: 2 Gy
Plan Total Fractions Prescribed: 33
Plan Total Prescribed Dose: 66 Gy
Reference Point Dosage Given to Date: 62 Gy
Reference Point Session Dosage Given: 2 Gy
Session Number: 31

## 2023-12-22 IMAGING — CT CT CHEST SUPER D W/O CM
2 of 5 series · 14 of 36 positions shown, 17 images · non-contrast
Comparison: PET exam from March 17, 2022.

CLINICAL DATA: A 70-year-old female presents for evaluation of
abnormal lung cancer screening evaluation, suspicion for pulmonary
neoplasm.

EXAM:
CT CHEST WITHOUT CONTRAST
TECHNIQUE: Multidetector CT imaging of the chest was performed using thin slice
collimation for electromagnetic bronchoscopy planning purposes,
without intravenous contrast.
RADIATION DOSE REDUCTION: This exam was performed according to the
departmental dose-optimization program which includes automated
exposure control, adjustment of the mA and/or kV according to
patient size and/or use of iterative reconstruction technique.

[Series 4: super d · axial · 0.72mm/px · z∈[-264,-22]mm · 11 of 285 slices shown, 14 images]
[im 22/285  mediastinal]
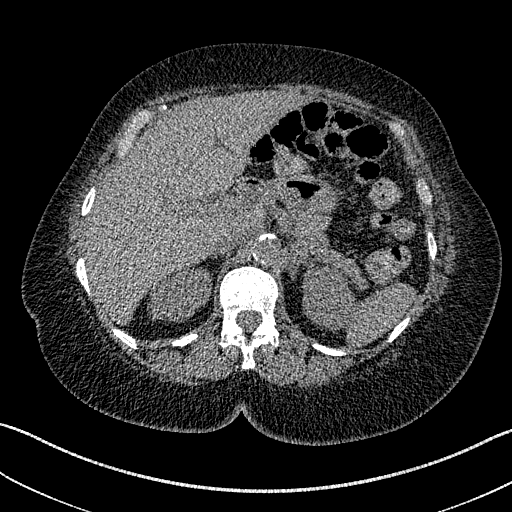
[im 22/285  lung]
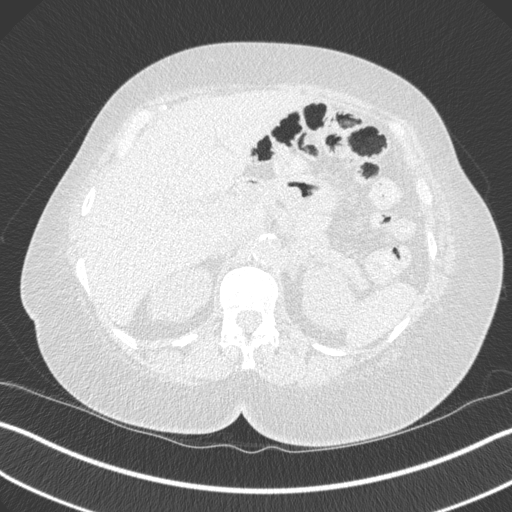
[im 44/285  lung]
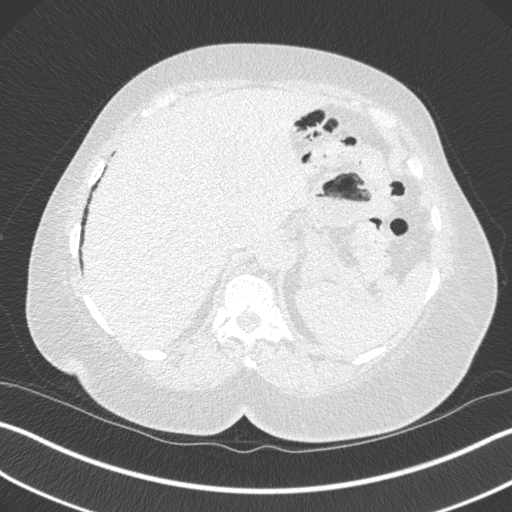
[im 66/285  lung]
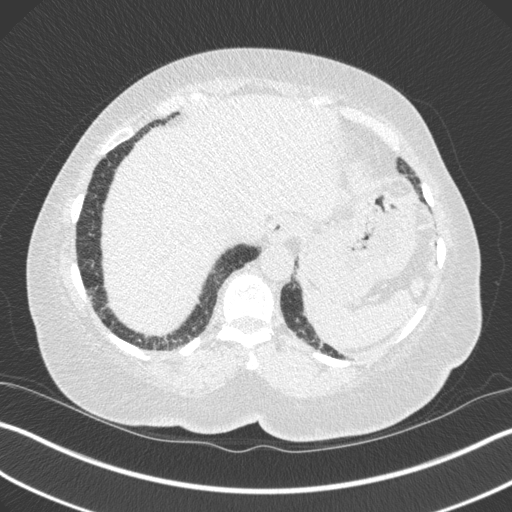
[im 88/285  lung]
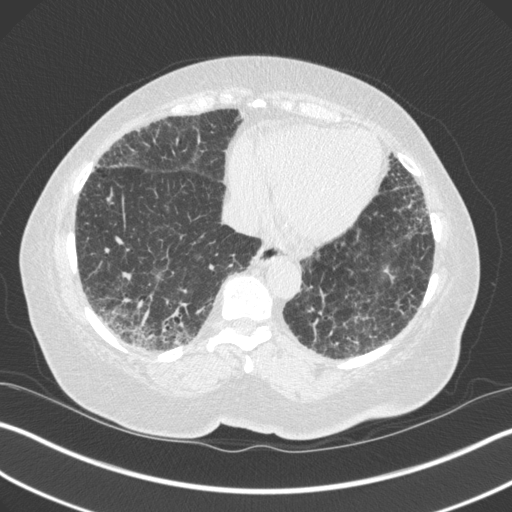
[im 110/285  mediastinal]
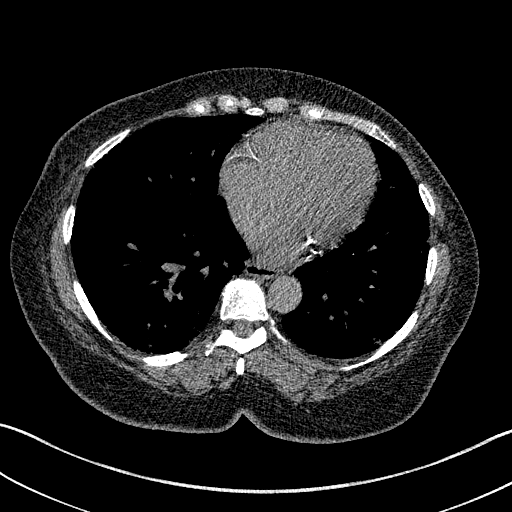
[im 110/285  lung]
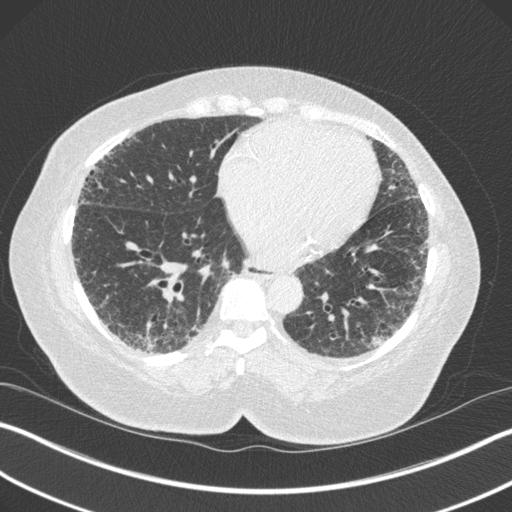
[im 153/285  lung]
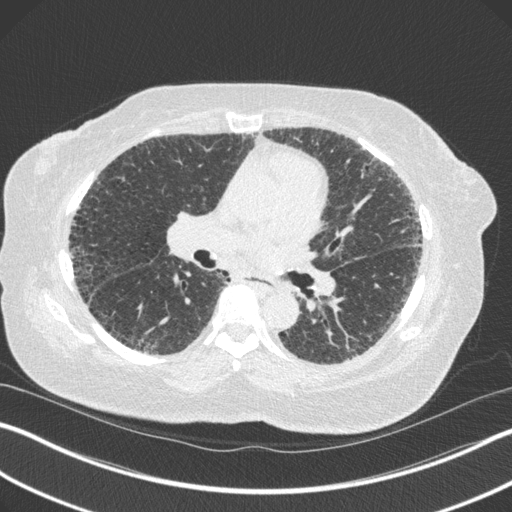
[im 175/285  lung]
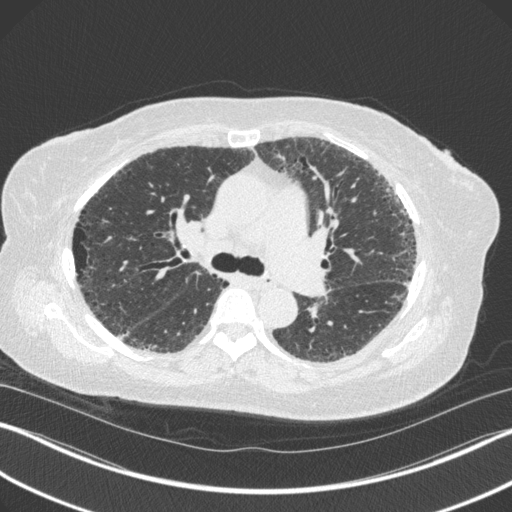
[im 197/285  lung]
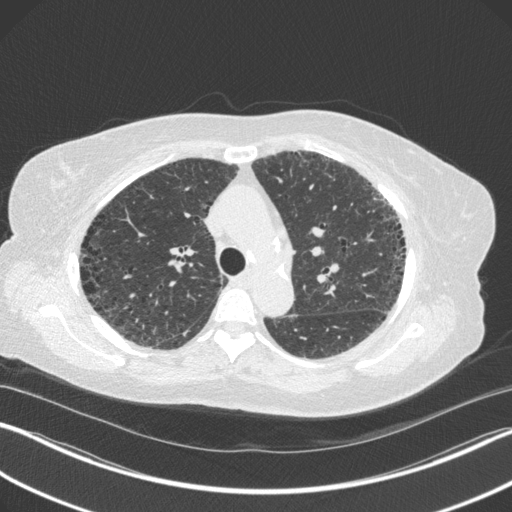
[im 219/285  mediastinal]
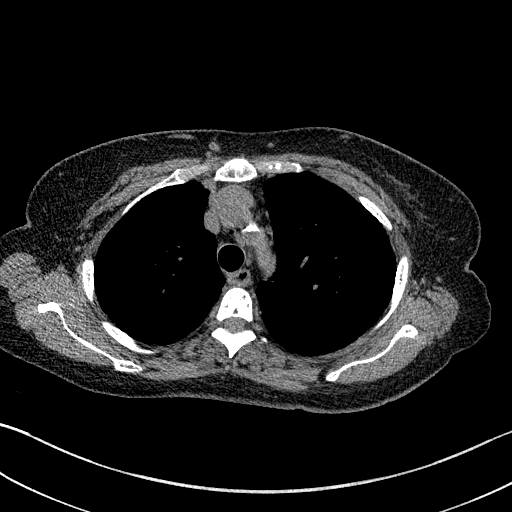
[im 219/285  lung]
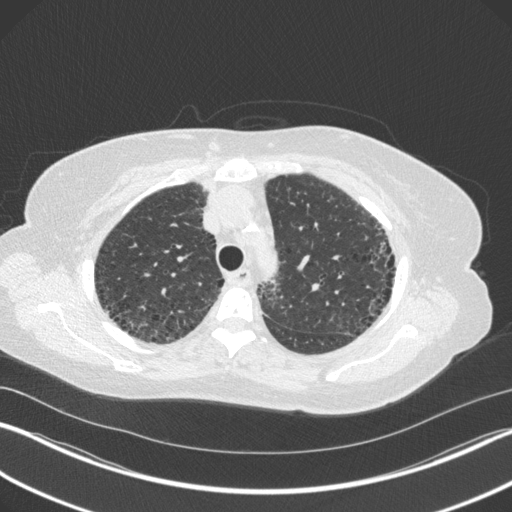
[im 241/285  lung]
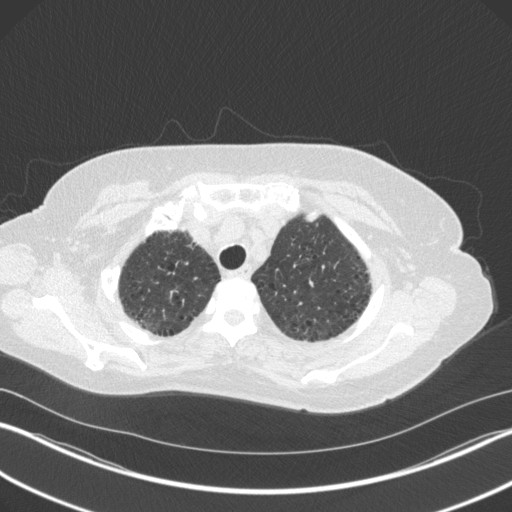
[im 263/285  lung]
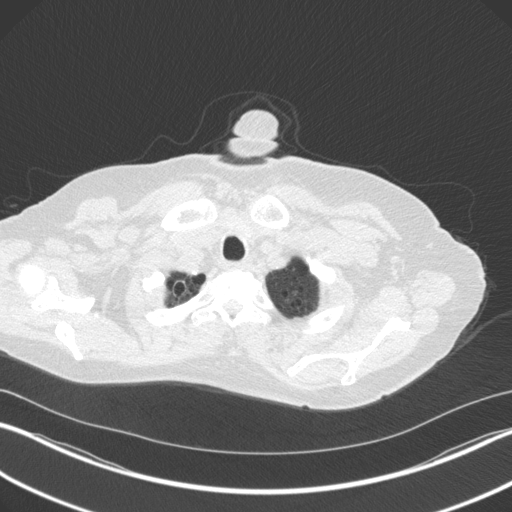

[Series 6: coronal · coronal · 0.61mm/px · 3 of 111 slices shown]
[im 23/111  lung]
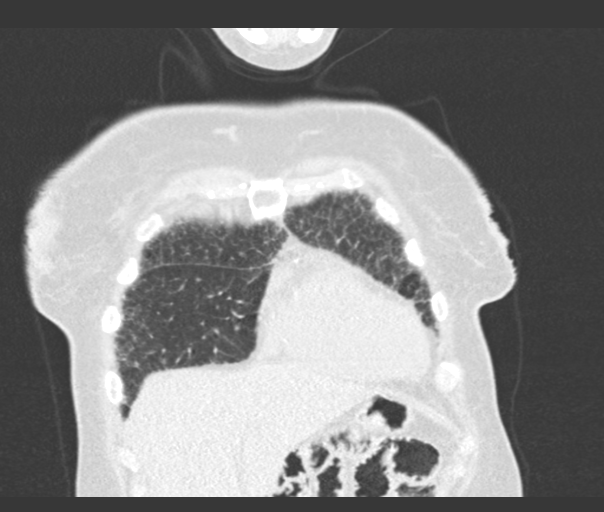
[im 45/111  lung]
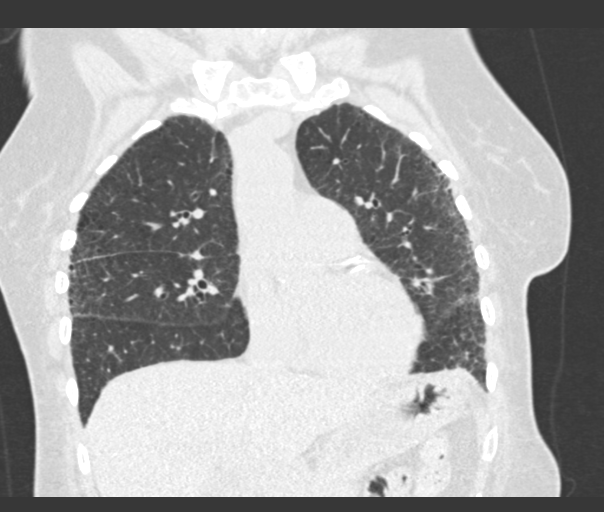
[im 67/111  lung]
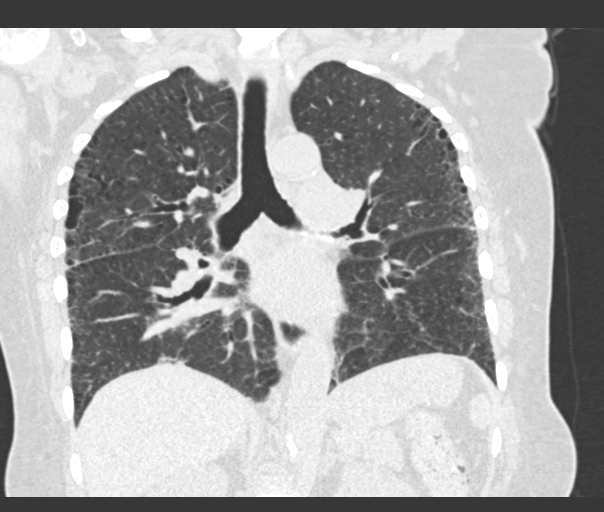

[14 of 36 positions shown; findings below may reference images not displayed]

FINDINGS: Cardiovascular: Calcified atheromatous plaque of the thoracic aorta
without aneurysmal dilation. Heart size normal without substantial
pericardial effusion. Central pulmonary vasculature 3.5 cm.

Limited assessment of cardiovascular structures given lack of
intravenous contrast.

Mediastinum/Nodes: Scattered small lymph nodes throughout the chest
none displaying pathologic enlargement. Please refer to PET imaging
for further detail.

Lungs/Pleura: And irregular RIGHT lower lobe nodule just above the
RIGHT hemidiaphragm measures 2.1 x 1.5 cm greatest axial dimension
and approximately 9 mm greatest craniocaudal dimension, abutting the
pleural surface in the RIGHT lower lobe and displaying irregular
margins.

Signs of pulmonary emphysema.

Subpleural reticulation and moderate septal thickening.

Mild bronchiectatic changes at the lung bases.  Airways are patent.

Subtle nodular density in the LEFT lung base (image 82/5) 9 x 8 mm.

Upper Abdomen: Incidental imaging of upper abdominal contents
without acute process.

Musculoskeletal: L1 compression fracture involving the superior
endplate. Some surrounding degenerative change suggest chronicity.
No surrounding soft tissue stranding. Degenerative changes elsewhere
in the spine. Osteopenia.
IMPRESSION: 1. Irregular RIGHT lower lobe nodule just above the RIGHT
hemidiaphragm measures 2.1 x 1.5 cm greatest axial dimension and
approximately 9 mm greatest craniocaudal dimension, abutting the
pleural surface in the RIGHT lower lobe and displaying irregular
margins. Findings concerning for bronchogenic neoplasm.
2. Subtle nodular density in the LEFT lung base 9 x 8 mm. This
appears more bandlike in ill-defined in the coronal reconstruction
and may represent an area of scarring associated with interstitial
lung disease. Attention on follow-up.
3. Subpleural reticulation, mild traction bronchiectasis with
basilar predominance. Signs of interstitial lung disease with
"probable UIP pattern". Consider high-resolution chest CT on
follow-up as warranted.
4. Pulmonary emphysema and aortic atherosclerosis.

Aortic Atherosclerosis (TGGHM-6I7.7) and Emphysema ([id number].X).

## 2023-12-22 IMAGING — CT NM PET TUM IMG INITIAL (PI) SKULL BASE T - THIGH
7 series · 25 of 25 positions shown · non-contrast
Comparison: 03/17/2022 chest CT.

CLINICAL DATA: Initial treatment strategy for pulmonary nodule.

EXAM:
NUCLEAR MEDICINE PET SKULL BASE TO THIGH
TECHNIQUE: 8.7 mCi F-18 FDG was injected intravenously. Full-ring PET imaging
was performed from the skull base to thigh after the radiotracer. CT
data was obtained and used for attenuation correction and anatomic
localization.
Fasting blood glucose: 100 mg/dl

[Series 3: pet sk_thigh ac · axial · 5.0mm · 4.07mm/px · z∈[-911,-95]mm · 6 of 205 slices shown]
[im 1/205]
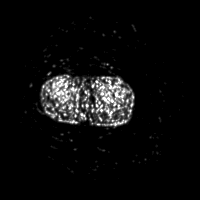
[im 41/205]
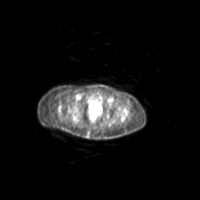
[im 82/205]
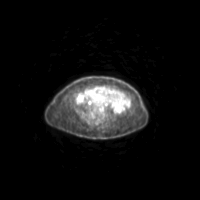
[im 123/205]
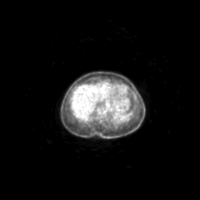
[im 164/205]
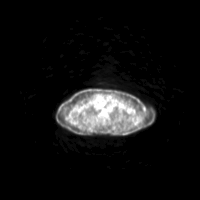
[im 205/205]
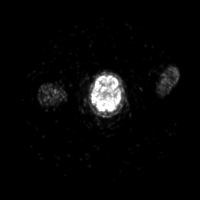

[Series 4: ct sk_thigh 5.0 bf37 · axial · 5.0mm · 0.98mm/px · z∈[-911,-95]mm · 5 of 205 slices shown]
[im 1/205]
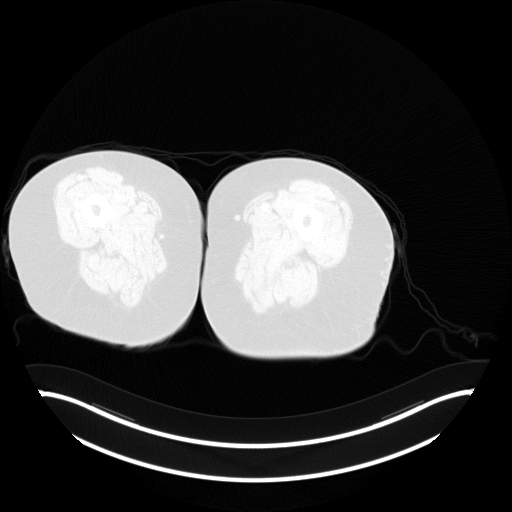
[im 52/205]
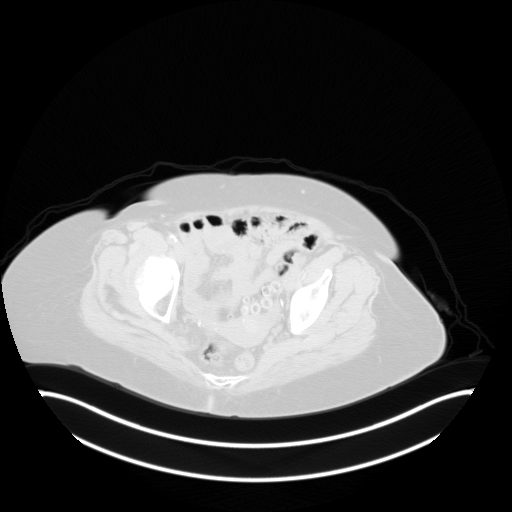
[im 103/205]
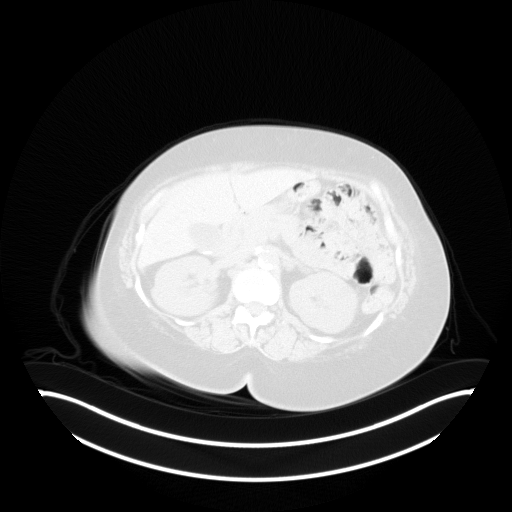
[im 154/205]
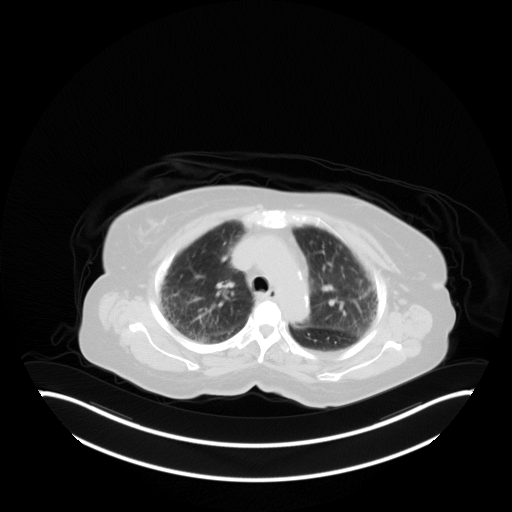
[im 205/205  brain]
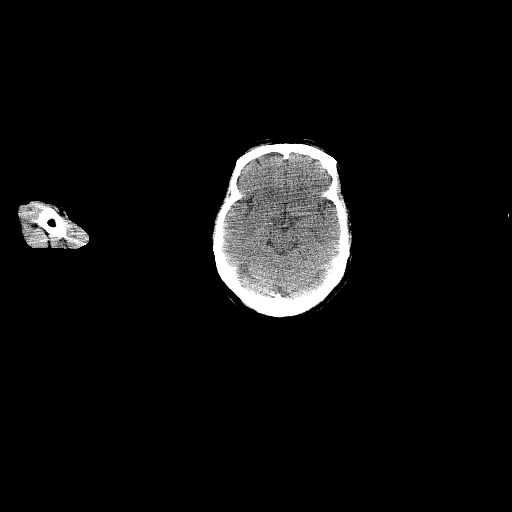

[Series 5: pet sk_thigh nac · axial · 5.0mm · 4.07mm/px · z∈[-911,-95]mm · 5 of 205 slices shown]
[im 1/205]
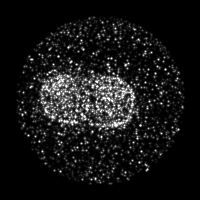
[im 52/205]
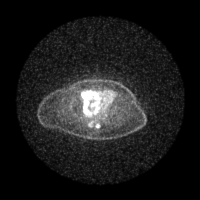
[im 103/205]
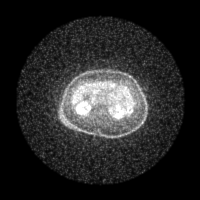
[im 154/205]
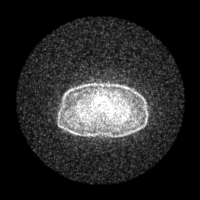
[im 205/205]
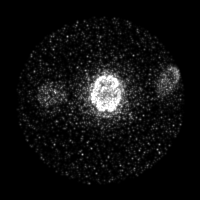

[Series 8: ct sk_thigh 5.0 br59 lung_bone · axial · 5.0mm · 0.60mm/px · z∈[-456,-232]mm · 2 of 57 slices shown]
[im 1/57  brain]
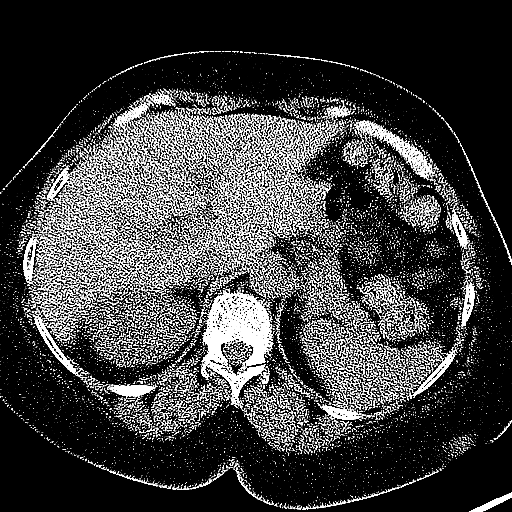
[im 57/57  brain]
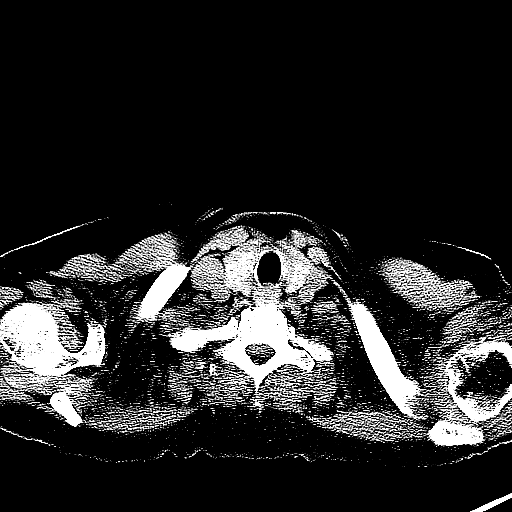

[Series 603: <mip collection> · coronal · 1.69mm/px · 1 of 32 slices shown]
[im 1/32]
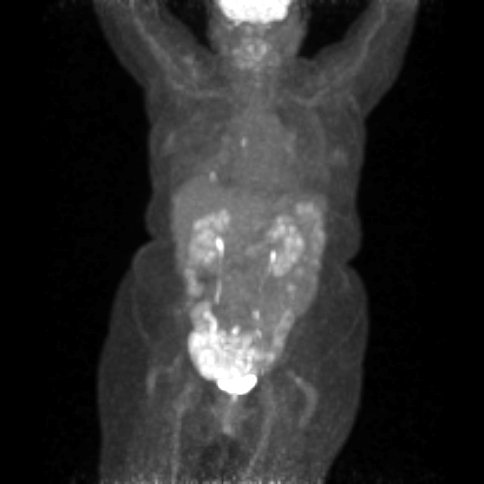

[Series 604: fused cor · 1 of 46 slices shown]
[im 1/46]
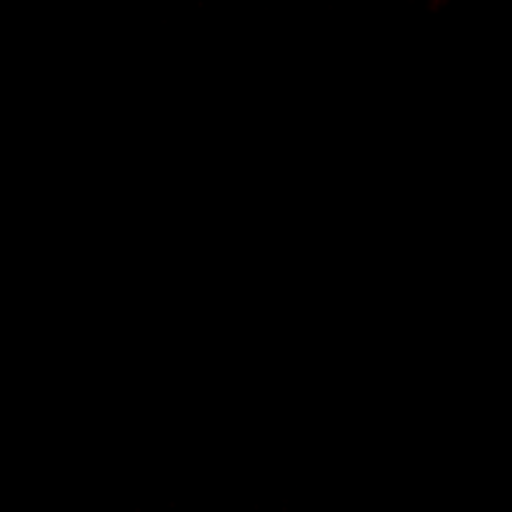

[Series 605: range-ct sk_thigh 5.0 bf37-tra-<alpha range> · 5 of 199 slices shown]
[im 1/199]
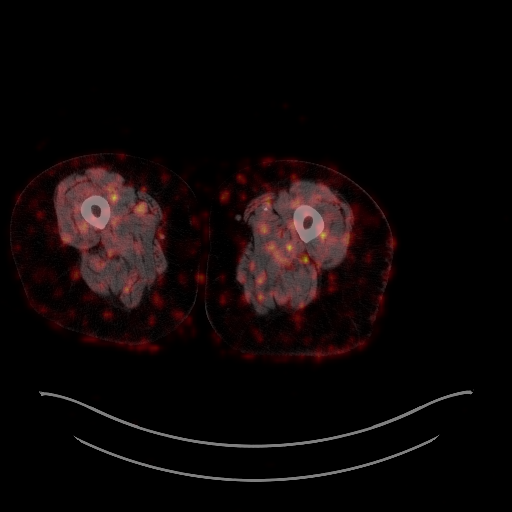
[im 50/199]
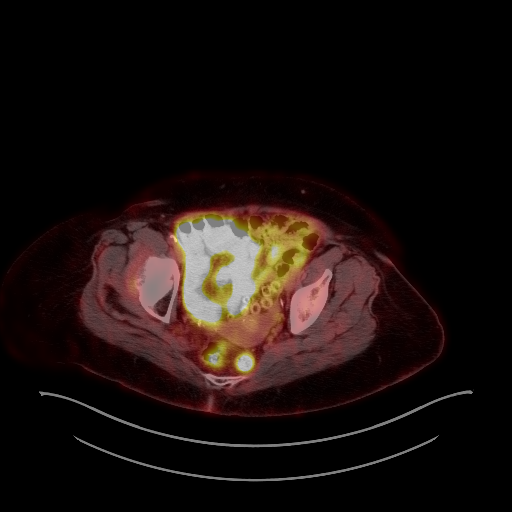
[im 100/199]
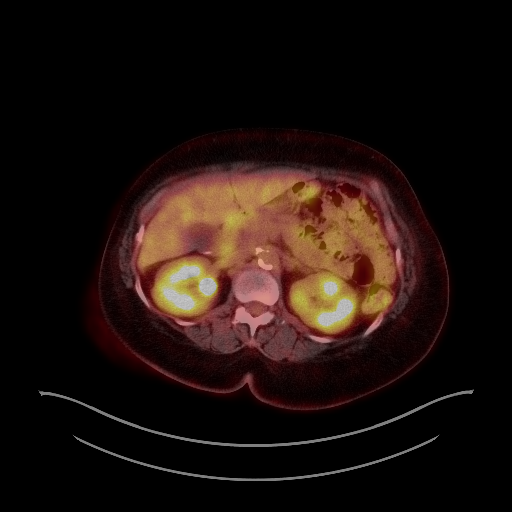
[im 149/199]
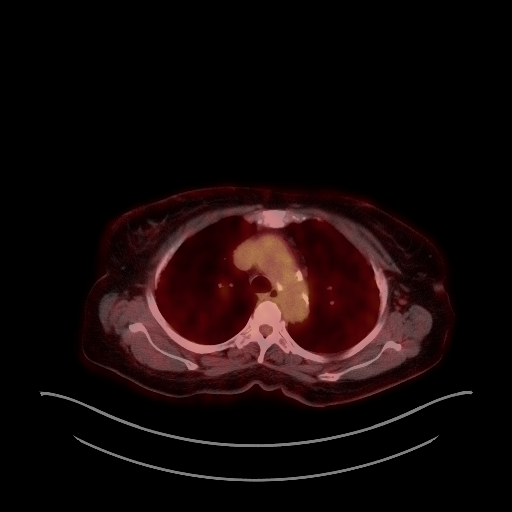
[im 199/199]
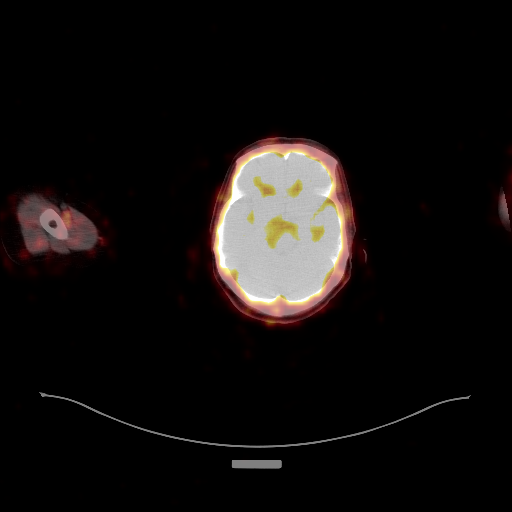

[25 of 25 positions shown; findings below may reference images not displayed]

FINDINGS: Mediastinal blood pool activity: SUV max

Liver activity: SUV max NA

NECK: No hypermetabolic lymph nodes in the neck.

Incidental CT findings: none

CHEST:

Hypermetabolic solid basilar right lower lobe 1.7 cm pulmonary
nodule with max SUV 5.0 (series 8/image 43). Small focus of
hypermetabolism associated with an indistinct 0.4 cm pulmonary
nodule in the far medial superior segment right lower lobe with max
SUV 5.7 (series 8/image 27). No additional hypermetabolic pulmonary
findings.

No enlarged or hypermetabolic axillary, mediastinal or hilar lymph
nodes.

Incidental CT findings: Moderate paraseptal and centrilobular
emphysema with diffuse bronchial wall thickening. Mild to moderate
patchy peripheral reticulation and ground-glass opacity in both
lungs. Coronary atherosclerosis. Atherosclerotic nonaneurysmal
thoracic aorta.

ABDOMEN/PELVIS: No abnormal hypermetabolic activity within the
liver, pancreas, adrenal glands, or spleen. No hypermetabolic lymph
nodes in the abdomen or pelvis.

Incidental CT findings: Cholelithiasis. Atherosclerotic
nonaneurysmal abdominal aorta. Marked sigmoid diverticulosis.
Calcified central uterine 1.5 cm degenerated fibroid.

SKELETON: No focal hypermetabolic activity to suggest skeletal
metastasis.

Incidental CT findings: none
IMPRESSION: 1. Hypermetabolic 1.7 cm solid basilar right lower lobe pulmonary
nodule suspicious for primary bronchogenic malignancy.
2. Small focus of hypermetabolism in the far medial superior segment
right lower lobe associated with an indistinct 0.4 cm pulmonary
nodule, nonspecific, potentially metastatic or inflammatory. Suggest
attention on short-term follow-up chest CT in 3 months.
3. No hypermetabolic thoracic adenopathy or distant metastatic
disease.
4. Chronic findings include: Aortic Atherosclerosis (0K1S4-JF4.4)
and Emphysema ([id number].L). Coronary atherosclerosis. Spectrum of
findings in the periphery of the lungs that may indicate fibrotic
interstitial lung disease. High-resolution chest CT may be obtained
for further evaluation as clinically warranted. Cholelithiasis.
Myomatous uterus. Marked sigmoid diverticulosis.

## 2023-12-23 ENCOUNTER — Ambulatory Visit
Admission: RE | Admit: 2023-12-23 | Discharge: 2023-12-23 | Disposition: A | Discharge status: Home / Self Care | Payer: Medicare Other | Source: Ambulatory Visit | Attending: Radiation Oncology | Admitting: Radiation Oncology

## 2023-12-23 ENCOUNTER — Other Ambulatory Visit: Payer: Self-pay

## 2023-12-23 ENCOUNTER — Ambulatory Visit
Admission: RE | Admit: 2023-12-23 | Discharge: 2023-12-23 | Disposition: A | Discharge status: Home / Self Care | Payer: Medicare Other | Source: Ambulatory Visit | Attending: Radiation Oncology

## 2023-12-23 DIAGNOSIS — Z510 Encounter for antineoplastic radiation therapy: Secondary | ICD-10-CM | POA: Diagnosis not present

## 2023-12-23 LAB — RAD ONC ARIA SESSION SUMMARY
Course Elapsed Days: 46
Plan Fractions Treated to Date: 32
Plan Prescribed Dose Per Fraction: 2 Gy
Plan Total Fractions Prescribed: 33
Plan Total Prescribed Dose: 66 Gy
Reference Point Dosage Given to Date: 64 Gy
Reference Point Session Dosage Given: 2 Gy
Session Number: 32

## 2023-12-26 ENCOUNTER — Other Ambulatory Visit: Payer: Self-pay

## 2023-12-26 ENCOUNTER — Ambulatory Visit: Payer: Medicare Other

## 2023-12-26 ENCOUNTER — Ambulatory Visit
Admission: RE | Admit: 2023-12-26 | Discharge: 2023-12-26 | Disposition: A | Discharge status: Home / Self Care | Payer: Medicare Other | Source: Ambulatory Visit | Attending: Radiation Oncology

## 2023-12-26 DIAGNOSIS — Z510 Encounter for antineoplastic radiation therapy: Secondary | ICD-10-CM | POA: Diagnosis not present

## 2023-12-26 LAB — RAD ONC ARIA SESSION SUMMARY
Course Elapsed Days: 49
Plan Fractions Treated to Date: 33
Plan Prescribed Dose Per Fraction: 2 Gy
Plan Total Fractions Prescribed: 33
Plan Total Prescribed Dose: 66 Gy
Reference Point Dosage Given to Date: 66 Gy
Reference Point Session Dosage Given: 2 Gy
Session Number: 33

## 2023-12-27 ENCOUNTER — Ambulatory Visit: Payer: Medicare Other

## 2023-12-27 NOTE — Radiation Completion Notes (Signed)
Patient Name: Lindsey Williamson, Lindsey Williamson MRN: [id number] Date of Birth: 10-12-1951 Referring Physician: Audie Box, M.D. Date of Service: 2023-12-27 Radiation Oncologist: Margaretmary Bayley, M.D. [facility] [place]                             RADIATION ONCOLOGY END OF TREATMENT NOTE     Diagnosis: C34.31 Malignant neoplasm of lower lobe, right bronchus or lung Staging on 2023-10-10: Primary cancer of right lower lobe of lung (HCC) T=cT3, N=cN2, M=cM0 Intent: Curative     ==========DELIVERED PLANS==========  First Treatment Date: 2023-11-07 Last Treatment Date: 2023-12-26   Plan Name: Lung_R Site: Bronchus, Right Technique: 3D Mode: Photon Dose Per Fraction: 2 Gy Prescribed Dose (Delivered / Prescribed): 66 Gy / 66 Gy Prescribed Fxs (Delivered / Prescribed): 33 / 33     ==========ON TREATMENT VISIT DATES========== 2023-11-11, 2023-11-18, 2023-11-28, 2023-12-02, 2023-12-07, 2023-12-16, 2023-12-23     ==========UPCOMING VISITS==========       ==========APPENDIX - ON TREATMENT VISIT NOTES==========   See weekly On Treatment Notes in Epic for details in the Media tab (listed as Progress notes on the On Treatment Visit Dates listed above).

## 2023-12-30 ENCOUNTER — Other Ambulatory Visit: Payer: Self-pay

## 2024-01-10 ENCOUNTER — Other Ambulatory Visit: Payer: Self-pay | Admitting: Physician Assistant

## 2024-01-10 DIAGNOSIS — C3431 Malignant neoplasm of lower lobe, right bronchus or lung: Secondary | ICD-10-CM

## 2024-01-13 ENCOUNTER — Ambulatory Visit (HOSPITAL_COMMUNITY)
Admission: RE | Admit: 2024-01-13 | Discharge: 2024-01-13 | Disposition: A | Discharge status: Home / Self Care | Payer: Medicare Other | Source: Ambulatory Visit | Attending: Physician Assistant

## 2024-01-13 DIAGNOSIS — C3431 Malignant neoplasm of lower lobe, right bronchus or lung: Secondary | ICD-10-CM | POA: Insufficient documentation

## 2024-01-13 MED ORDER — IOHEXOL 300 MG/ML  SOLN
75.0000 mL | Freq: Once | INTRAMUSCULAR | Status: AC | PRN
  Administered 2024-01-13: 75 mL via INTRAVENOUS

## 2024-01-20 ENCOUNTER — Encounter: Payer: Self-pay | Admitting: Internal Medicine

## 2024-01-20 ENCOUNTER — Ambulatory Visit: Payer: Medicare Other | Admitting: Internal Medicine

## 2024-01-20 ENCOUNTER — Other Ambulatory Visit: Payer: Self-pay

## 2024-01-20 VITALS — BP 132/60 | HR 89 | Temp 97.8°F | Resp 12 | Ht 64.37 in | Wt 146.2 lb

## 2024-01-20 DIAGNOSIS — K219 Gastro-esophageal reflux disease without esophagitis: Secondary | ICD-10-CM | POA: Diagnosis not present

## 2024-01-20 DIAGNOSIS — J0100 Acute maxillary sinusitis, unspecified: Secondary | ICD-10-CM | POA: Diagnosis not present

## 2024-01-20 DIAGNOSIS — J3089 Other allergic rhinitis: Secondary | ICD-10-CM | POA: Diagnosis not present

## 2024-01-20 DIAGNOSIS — C3431 Malignant neoplasm of lower lobe, right bronchus or lung: Secondary | ICD-10-CM | POA: Diagnosis not present

## 2024-01-20 MED ORDER — DOXYCYCLINE HYCLATE 100 MG PO TABS: 100.0000 mg | ORAL_TABLET | Freq: Two times a day (BID) | ORAL | 0 refills | Status: AC

## 2024-01-20 NOTE — Progress Notes (Unsigned)
Follow Up Note  RE: Lindsey Williamson MRN: [id number] DOB: July 26, 1951 Date of Office Visit: 01/20/2024  Referring provider: [name], MD Primary care provider: [name], MD  Chief Complaint: Sinus Problem  History of Present Illness: I had the pleasure of seeing Lindsey Williamson for a follow up visit at the [facility] on 01/20/2024. She is a 73 y.o. female, who is being followed for mixed rhinitis, cough, GERD. Her previous allergy office visit was on 05/21/22 with Dr. Marlynn Perking. Today is a regular follow up visit.  History obtained from patient, chart review.  The patient, diagnosed with lung cancer, completed thirty radiation treatments and seven chemotherapy sessions, with the last treatment on December 30th. She reports an increased cough and persistent nasal discharge since the treatments. An MRI of the brain was performed due to the potential for metastasis, but no cancer was detected. However, the patient reports persistent head pain and frequent sneezing, which have been ongoing for a while but were overshadowed by the side effects of the cancer treatments.  The patient also reports a sore throat, which she is unsure if it is related to the nasal drainage or another issue. She has been using a product called Biotene for dryness in the mouth, which was a side effect of the treatments. She also mentions living in a poorly ventilated apartment, which may be contributing to her symptoms.  Despite the treatments, the patient reports that her cancer remained localized and did not spread. However, she expresses concern about not feeling "right" in her head and experiencing significant pain. She also expresses anxiety about the results of a recent CT scan, which she has not yet received.  The patient acknowledges a history of smoking and seems to attribute her lung cancer to this habit. She expresses a positive outlook, finding comfort in the support from others  at the cancer center and maintaining hope for good news from her recent scan.    Assessment and Plan: Diksha is a 73 y.o. female with: No diagnosis found. Plan: There are no Patient Instructions on file for this visit. No follow-ups on file.  No orders of the defined types were placed in this encounter.   Lab Orders  No laboratory test(s) ordered today   Diagnostics: None performed     Medication List:  Current Outpatient Medications  Medication Sig Dispense Refill   amLODipine (NORVASC) 5 MG tablet Take 5 mg by mouth daily.     aspirin EC 81 MG tablet Take 81 mg by mouth daily. Swallow whole.     azelastine (ASTELIN) 0.1 % nasal spray Place 2 sprays into both nostrils 2 (two) times daily as needed for rhinitis. 30 mL 5   cetirizine (ZYRTEC ALLERGY) 10 MG tablet Take 1 tablet (10 mg total) by mouth daily. 90 tablet 1   diclofenac Sodium (VOLTAREN) 1 % GEL Apply 1 application  topically 2 (two) times daily as needed (Pain).     doxycycline (VIBRA-TABS) 100 MG tablet Take 1 tablet (100 mg total) by mouth 2 (two) times daily. 60 tablet 0   empagliflozin (JARDIANCE) 25 MG TABS tablet Take 25 mg by mouth daily.     famotidine (PEPCID) 20 MG tablet Take 1 tablet (20 mg total) by mouth 2 (two) times daily. 180 tablet 1   metFORMIN (GLUCOPHAGE-XR) 500 MG 24 hr tablet Take 1,000 mg by mouth 2 (two) times daily.     nystatin (MYCOSTATIN/NYSTOP) powder Apply 1 Application topically  3 (three) times daily. 15 g 0   omeprazole (PRILOSEC) 20 MG capsule Take 1 capsule (20 mg total) by mouth daily. 30 capsule 2   pravastatin (PRAVACHOL) 80 MG tablet Take 80 mg by mouth daily.     prochlorperazine (COMPAZINE) 10 MG tablet TAKE 1 TABLET(10 MG) BY MOUTH EVERY 6 HOURS AS NEEDED 30 tablet 2   sucralfate (CARAFATE) 1 g tablet TAKE 1 TABLET(1 GRAM) BY MOUTH FOUR TIMES DAILY(WITH MEALS AND AT BEDTIME) 120 tablet 0   No current facility-administered medications for this visit.    Allergies: Allergies  Allergen Reactions   Benzonatate Palpitations   Lisinopril Swelling and Other (See Comments)    Patient ended up in the ED with a swollen mouth and tongue   Aspirin Palpitations and Nausea And Vomiting    Can tolerate baby aspirin without difficulty   Azithromycin Palpitations   Codeine Palpitations   I reviewed her past medical history, social history, family history, and environmental history and no significant changes have been reported from her previous visit.  ROS: All others negative except as noted per HPI.   Objective: BP 132/60 (BP Location: Left Arm, Patient Position: Sitting, Cuff Size: Normal)   Pulse 89   Temp 97.8 F (36.6 C) (Temporal)   Resp 12   Ht 5' 4.37" (1.635 m)   Wt 146 lb 3.2 oz (66.3 kg)   SpO2 97%   BMI 24.81 kg/m  Body mass index is 24.81 kg/m. General Appearance:  Alert, cooperative, no distress, appears stated age  Head:  Normocephalic, without obvious abnormality, atraumatic  Eyes:  Conjunctiva clear, EOM's intact  Nose: Nares normal, hypertrophic turbinates, no visible anterior polyps, and septum midline,  Sinus tenderness   Throat: Lips, tongue normal; teeth and gums normal, + cobblestoning  Neck: Supple, symmetrical  Lungs:   clear to auscultation bilaterally, Respirations unlabored, no coughing  Heart:  regular rate and rhythm and no murmur, Appears well perfused  Extremities: No edema  Skin: Skin color, texture, turgor normal, no rashes or lesions on visualized portions of skin   Neurologic: No gross deficits   Previous notes and tests were reviewed. The plan was reviewed with the patient/family, and all questions/concerned were addressed.  It was my pleasure to see [name] today and participate in her care. Please feel free to contact me with any questions or concerns.  Sincerely,  Ferol Luz, MD  Allergy & Immunology  [facility] Office: [phone]

## 2024-01-20 NOTE — Patient Instructions (Signed)
MIxed Rhinitis: complicated by an acute sinus infection currently  Sinus infection:  Kenalog 40mg  IM injection given today  Start: Augmentin 1 tab twice a day for 7 days   -Continue avoidance measures of weed, cat, dust - Continue with: Zyrtec (cetirizine) 10mg  tablet once daily, Singulair (montelukast) 10mg  daily, Flonase (fluticasone) one spray per nostril daily, and Astelin (azelastine) 2 sprays per nostril 1-2 times daily as needed - You can use an extra dose of the antihistamine, if needed, for breakthrough symptoms.  - Consider nasal saline rinses 1-2 times daily to remove allergens from the nasal cavities as well as help with mucous clearance (this is especially helpful to do before the nasal sprays are given)   Cough :  -We will focus on contributing causes to include reflux and rhinitis  -radiation may play a part as well, we will continue to monitor  -Continue to walk for pulmonary rehab -Congratulations for quitting smoking, keep up the good work   GERD  -Continue dietary and lifestyle modifications -Continue famotidine 20 mg twice daily  Follow up:  6 months   Thank you so much for letting me partake in your care today.  Don't hesitate to reach out if you have any additional concerns!  Ferol Luz, MD  [facility] [place] [place]

## 2024-01-23 ENCOUNTER — Telehealth: Payer: Self-pay | Admitting: Physician Assistant

## 2024-01-23 NOTE — Progress Notes (Unsigned)
[facility] OFFICE PROGRESS NOTE  Lindsey Mis, MD [address] [place] [address]  DIAGNOSIS: 1) history of stage Ia non-small cell lung cancer, adenocarcinoma the right upper lobe who under went radiation under the care of Dr. Kathrynn Running from 04/23/2022-04/29/22 2 Stage IIIa confirm (T3, N2, M0) non-small cell lung cancer, squamous cell carcinoma. She presented with recurrent tumor at the right lung base, associated right lower lobe metastasis, and right hilar and subcarinal nodal metastases. This was diagnosed in October 2024.  PDL1: 85%   PRIOR THERAPY:  1) Radiation to the right upper lobe under the care of Dr. Kathrynn Running from 04/23/2022-04/29/22  2) Concurrent chemoradiation with carboplatin for an AUC of 2 and paclitaxel 45 mg/m.  Last dose on 12/20/23 Status post 7 cycle.   CURRENT THERAPY: Consolidation immunotherapy with Imfinzi 1500 mg IV every 4 weeks, first dose expected on 01/31/24.  INTERVAL HISTORY: Lindsey Williamson 73 y.o. female returns to the clinic today for a follow up visit. The patient was last seen in the clinic today on 12/12/23. He completed concurrent chemoradiation last month. She has had persistent cough since that time that produces clear phlegm.  She has tried to use robitussin but it has not been effective. She has diabetes and may not be a good candidate for steriods. She does not want to take any cough medications that cause drowsiness. She also saw her allergist last week for sinuses and was started on doxycycline for 10 days. She has some associated dyspnea on exertion.  Denies any fevers or chills. Denies any chest pain.  Denies any nausea, vomiting, diarrhea, or constipation. She recently had a restaging CT scan performed. She is here for evaluation and to review her scan results.    MEDICAL HISTORY: Past Medical History:  Diagnosis Date   Angioedema 10/17/2023   Arthritis    Diabetes mellitus without complication (HCC)     Hypertension     ALLERGIES:  is allergic to benzonatate, lisinopril, aspirin, azithromycin, and codeine.  MEDICATIONS:  Current Outpatient Medications  Medication Sig Dispense Refill   amLODipine (NORVASC) 5 MG tablet Take 5 mg by mouth daily.     aspirin EC 81 MG tablet Take 81 mg by mouth daily. Swallow whole.     azelastine (ASTELIN) 0.1 % nasal spray Place 2 sprays into both nostrils 2 (two) times daily as needed for rhinitis. 30 mL 5   cetirizine (ZYRTEC ALLERGY) 10 MG tablet Take 1 tablet (10 mg total) by mouth daily. 90 tablet 1   diclofenac Sodium (VOLTAREN) 1 % GEL Apply 1 application  topically 2 (two) times daily as needed (Pain).     doxycycline (VIBRA-TABS) 100 MG tablet Take 1 tablet (100 mg total) by mouth 2 (two) times daily for 10 days. 20 tablet 0   empagliflozin (JARDIANCE) 25 MG TABS tablet Take 25 mg by mouth daily.     famotidine (PEPCID) 20 MG tablet Take 1 tablet (20 mg total) by mouth 2 (two) times daily. 180 tablet 1   metFORMIN (GLUCOPHAGE-XR) 500 MG 24 hr tablet Take 1,000 mg by mouth 2 (two) times daily.     nystatin (MYCOSTATIN/NYSTOP) powder Apply 1 Application topically 3 (three) times daily. 15 g 0   omeprazole (PRILOSEC) 20 MG capsule Take 1 capsule (20 mg total) by mouth daily. 30 capsule 2   pravastatin (PRAVACHOL) 80 MG tablet Take 80 mg by mouth daily.     prochlorperazine (COMPAZINE) 10 MG tablet TAKE  1 TABLET(10 MG) BY MOUTH EVERY 6 HOURS AS NEEDED 30 tablet 2   sucralfate (CARAFATE) 1 g tablet TAKE 1 TABLET(1 GRAM) BY MOUTH FOUR TIMES DAILY(WITH MEALS AND AT BEDTIME) 120 tablet 0   No current facility-administered medications for this visit.    SURGICAL HISTORY:  Past Surgical History:  Procedure Laterality Date   BRONCHIAL BIOPSY  03/30/2022   Procedure: BRONCHIAL BIOPSIES;  Surgeon: Josephine Igo, DO;  Location: [facility] ENDOSCOPY;  Service: Pulmonary;;   BRONCHIAL BIOPSY  10/17/2023   Procedure: BRONCHIAL BIOPSIES;  Surgeon: Leslye Peer, MD;   Location: [facility] ENDOSCOPY;  Service: Pulmonary;;   BRONCHIAL BRUSHINGS  03/30/2022   Procedure: BRONCHIAL BRUSHINGS;  Surgeon: Josephine Igo, DO;  Location: [facility] ENDOSCOPY;  Service: Pulmonary;;   BRONCHIAL BRUSHINGS  10/17/2023   Procedure: BRONCHIAL BRUSHINGS;  Surgeon: Leslye Peer, MD;  Location: [facility] ENDOSCOPY;  Service: Pulmonary;;   BRONCHIAL NEEDLE ASPIRATION BIOPSY  03/30/2022   Procedure: BRONCHIAL NEEDLE ASPIRATION BIOPSIES;  Surgeon: Josephine Igo, DO;  Location: [facility] ENDOSCOPY;  Service: Pulmonary;;   BRONCHIAL NEEDLE ASPIRATION BIOPSY  10/17/2023   Procedure: BRONCHIAL NEEDLE ASPIRATION BIOPSIES;  Surgeon: Leslye Peer, MD;  Location: [facility] ENDOSCOPY;  Service: Pulmonary;;   EYE SURGERY     FIDUCIAL MARKER PLACEMENT  03/30/2022   Procedure: FIDUCIAL MARKER PLACEMENT;  Surgeon: Josephine Igo, DO;  Location: [facility] ENDOSCOPY;  Service: Pulmonary;;   FINE NEEDLE ASPIRATION  10/17/2023   Procedure: FINE NEEDLE ASPIRATION;  Surgeon: Leslye Peer, MD;  Location: [facility] ENDOSCOPY;  Service: Pulmonary;;   FOREIGN BODY REMOVAL  03/30/2022   Procedure: FOREIGN BODY REMOVAL;  Surgeon: Josephine Igo, DO;  Location: [facility] ENDOSCOPY;  Service: Pulmonary;;   HEMOSTASIS CONTROL  10/17/2023   Procedure: HEMOSTASIS CONTROL;  Surgeon: Leslye Peer, MD;  Location: [facility] ENDOSCOPY;  Service: Pulmonary;;   VIDEO BRONCHOSCOPY WITH ENDOBRONCHIAL ULTRASOUND Bilateral 03/30/2022   Procedure: VIDEO BRONCHOSCOPY WITH ENDOBRONCHIAL ULTRASOUND;  Surgeon: Josephine Igo, DO;  Location: [facility] ENDOSCOPY;  Service: Pulmonary;  Laterality: Bilateral;   VIDEO BRONCHOSCOPY WITH ENDOBRONCHIAL ULTRASOUND Right 10/17/2023   Procedure: VIDEO BRONCHOSCOPY WITH ENDOBRONCHIAL ULTRASOUND;  Surgeon: Leslye Peer, MD;  Location: [facility] ENDOSCOPY;  Service: Pulmonary;  Laterality: Right;   VIDEO BRONCHOSCOPY WITH RADIAL ENDOBRONCHIAL ULTRASOUND  03/30/2022   Procedure: VIDEO BRONCHOSCOPY WITH RADIAL ENDOBRONCHIAL ULTRASOUND;  Surgeon: Josephine Igo, DO;  Location: [facility] ENDOSCOPY;  Service: Pulmonary;;    REVIEW OF SYSTEMS:   Review of Systems  Constitutional: Positive for fatigue. Negative for appetite change, chills, fever and unexpected weight change.  HENT: Negative for mouth sores, nosebleeds, sore throat and trouble swallowing.   Eyes: Negative for eye problems and icterus.  Respiratory: Positive for cough and dyspnea on exertion. Negative for hemoptysis and wheezing.   Cardiovascular: Negative for chest pain and leg swelling.  Gastrointestinal: Negative for abdominal pain, constipation, diarrhea, nausea and vomiting.  Genitourinary: Negative for bladder incontinence, difficulty urinating, dysuria, frequency and hematuria.   Musculoskeletal: Negative for back pain, gait problem, neck pain and neck stiffness.  Skin: Negative for itching and rash.  Neurological: Negative for dizziness, extremity weakness, gait problem, headaches, light-headedness and seizures.  Hematological: Negative for adenopathy. Does not bruise/bleed easily.  Psychiatric/Behavioral: Negative for confusion, depression and sleep disturbance. The patient is not nervous/anxious.     PHYSICAL EXAMINATION:  There were no vitals taken for this visit.  ECOG PERFORMANCE STATUS: 1  Physical Exam  Constitutional: Oriented to person, place, and time  and well-developed, well-nourished, and in no distress. HENT:  Head: Normocephalic and atraumatic.  Mouth/Throat: Oropharynx is clear and moist. No oropharyngeal exudate.  Eyes: Conjunctivae are normal. Right eye exhibits no discharge. Left eye exhibits no discharge. No scleral icterus.  Neck: Normal range of motion. Neck supple.  Cardiovascular: Normal rate, regular rhythm, normal heart sounds and intact distal pulses.   Pulmonary/Chest: Effort normal. Quiet breath sounds in upper lung field. No respiratory distress. No wheezes. No rales.  Abdominal: Soft. Bowel sounds are normal. Exhibits no distension and no  mass. There is no tenderness.  Musculoskeletal: Normal range of motion. Exhibits no edema.  Lymphadenopathy:    No cervical adenopathy.  Neurological: Alert and oriented to person, place, and time. Exhibits normal muscle tone. Gait normal. Coordination normal.  Skin: Skin is warm and dry. No rash noted. Not diaphoretic. No erythema. No pallor.  Psychiatric: Mood, memory and judgment normal.  Vitals reviewed.  LABORATORY DATA: Lab Results  Component Value Date   WBC 1.9 (L) 12/19/2023   HGB 9.5 (L) 12/19/2023   HCT 31.1 (L) 12/19/2023   MCV 84.1 12/19/2023   PLT 266 12/19/2023      Chemistry      Component Value Date/Time   NA 139 12/19/2023 0854   K 3.7 12/19/2023 0854   CL 107 12/19/2023 0854   CO2 25 12/19/2023 0854   BUN 17 12/19/2023 0854   CREATININE 0.64 12/19/2023 0854      Component Value Date/Time   CALCIUM 9.1 12/19/2023 0854   ALKPHOS 45 12/19/2023 0854   AST 10 (L) 12/19/2023 0854   ALT <5 12/19/2023 0854   BILITOT 0.3 12/19/2023 0854       RADIOGRAPHIC STUDIES:  CT Chest W Contrast Result Date: 01/21/2024 CLINICAL DATA:  Non-small cell lung cancer restaging. * Tracking Code: BO * EXAM: CT CHEST WITH CONTRAST TECHNIQUE: Multidetector CT imaging of the chest was performed during intravenous contrast administration. RADIATION DOSE REDUCTION: This exam was performed according to the departmental dose-optimization program which includes automated exposure control, adjustment of the mA and/or kV according to patient size and/or use of iterative reconstruction technique. CONTRAST:  75mL OMNIPAQUE IOHEXOL 300 MG/ML  SOLN COMPARISON:  Chest x-ray 09/23/2023 and PET-CT 10/10/2023 FINDINGS: Cardiovascular: The heart is normal in size. No pericardial effusion. Stable tortuosity, ectasia and calcification of the thoracic aorta. No dissection. The branch vessels are patent. Stable three-vessel coronary artery calcifications. Mediastinum/Nodes: Persistent mediastinal and hilar  lymphadenopathy. Precarinal lymph node measures 8 mm on image 59/2 and previously measured 10 mm. Right paratracheal node measures 8 mm on image 57/2 and previously measured 8 mm. Right hilar adenopathy. Maximum transverse dimension on image 67/2 is 15 mm. This was previously 15 mm. Bulk E subcarinal adenopathy surrounding the bronchus intermedius measures 5.5 x 3.7 cm on image 73/2 and previously measured 5.5 x 3.7 cm. Stable left hilar lymph nodes. The esophagus is grossly normal. Lungs/Pleura: Stable emphysematous changes and pulmonary scarring. Peripheral interstitial thickening and subpleural reticulation with probable honeycombing and chronic interstitial disease. There are stable small scattered ground-glass nodules in both lungs. No new or progressive findings. The large masslike area of right lower lobe consolidation and air bronchograms appears progressive and may be related to progressive radiation pneumonitis/fibrosis. The medial masslike area near the fiducials is difficult to measure but appears grossly stable. Upper Abdomen: No significant upper abdominal findings. No hepatic or adrenal gland lesions. No upper abdominal adenopathy. Stable vascular disease. Musculoskeletal: No significant bony findings. Remote L1  compression fracture. IMPRESSION: 1. Stable mediastinal and hilar lymphadenopathy. 2. The large masslike area of right lower lobe consolidation and air bronchograms appears progressive and may be related to progressive radiation pneumonitis/fibrosis. The medial masslike area near the fiducials is difficult to measure but appears grossly stable. Recommend continued CT surveillance versus follow-up PET-CT. 3. Stable small scattered ground-glass nodules in both lungs. 4. Stable emphysematous changes and pulmonary scarring. 5. Stable peripheral interstitial thickening and subpleural reticulation with probable honeycombing and chronic interstitial disease. 6. Stable tortuosity, ectasia and  calcification of the thoracic aorta. 7. Stable three-vessel coronary artery calcifications. Aortic Atherosclerosis (ICD10-I70.0) and Emphysema (ICD10-J43.9). Electronically Signed   By: Rudie Meyer M.D.   On: 01/21/2024 21:05     ASSESSMENT/PLAN:  1) history of stage Ia non-small cell lung cancer, adenocarcinoma the right upper lobe who under went radiation under the care of Dr. Kathrynn Running from 04/23/2022-04/29/22 2 Stage IIIa confirm (T3, N2, M0) non-small cell lung cancer, squamous cell carcinoma. She presented with recurrent tumor at the right lung base, associated right lower lobe metastasis, and right hilar and subcarinal nodal metastases. This was diagnosed in October 2024.    PDL1 expression 85%.    The patient completed concurrent chemoradiation with carboplatin for an AUC of 2 and paclitaxel 45 mg/m weekly with concurrent radiation.  Her last dose was on 12/20/23.  She is status post 7 cycles.   The patient was seen with Dr. Arbutus Ped today.  Dr. Arbutus Ped personally and independently reviewed the scan and discussed results with the patient today.  The scan showed increased consolidation and air bronchograms in the right lower lobe which may be due to pneumonitis/fibrosis.    Dr. Arbutus Ped recommends giving her 2 more weeks to recover prior to starting the next steps.   Dr. Arbutus Ped recommends consolidation immunotherapy with Imfinzi 1500 mg IV every 4 weeks. She is interested in this option and she is expected to start her first cycle of treatment next week on 02/08/24.   I discussed with her the adverse effect of the immunotherapy including but not limited to immunotherapy mediated skin rash, diarrhea, inflammation of the lung, kidney, liver, thyroid or other endocrine dysfunction  We will see her back for a follow up visit in 2 weeks with cycle #1.   She will continue taking Robitussin for her cough.  She does not want any cough medications that cause drowsiness.  She has intolerance to  Tessalon per chart review.  We will send her Medrol Dosepak which may help reduce inflammation in her lungs.  The patient has diabetes she is advised to monitor blood sugar closely at home.     The patient was advised to call immediately if she has any concerning symptoms in the interval. The patient voices understanding of current disease status and treatment options and is in agreement with the current care plan. All questions were answered. The patient knows to call the clinic with any problems, questions or concerns. We can certainly see the patient much sooner if necessary  No orders of the defined types were placed in this encounter.    Veta Dambrosia L Kilynn Fitzsimmons, PA-C 01/23/24  ADDENDUM: Hematology/Oncology Attending: I had a face-to-face encounter with the patient today.  I reviewed her records, lab, scan and recommended her care plan.  The patient has a stage IIIa non-small cell lung cancer, squamous cell carcinoma diagnosed in October 2024 with PD-L1 expression of 85%.  She is status post a course of concurrent chemoradiation with weekly carboplatin and  paclitaxel.  The patient tolerated her treatment well except for the increasing shortness of breath and cough recently. She had repeat CT scan of the chest performed recently.  I personally and independently reviewed the scan images and discussed the result with the patient and her husband. Her scan showed stable mediastinal and hilar lymphadenopathy with a large mass area of the right lower lobe consolidation and air bronchogram appear progressive and may be related to progressive radiation pneumonitis/fibrosis. The patient was treated recently for suspicious pneumonia by her primary care provider and completed a course of doxycycline. I recommended for the patient to start a Medrol Dosepak to help with the radiation induced pneumonitis. I also discussed with her the role of consolidation treatment with immunotherapy with Imfinzi 1500 Mg  IV every 4 weeks for 1 years and the benefit of the consolidation treatment as well as the adverse effects. She is expected to start the first dose of this treatment in around 2 weeks. The patient will come back for follow-up visit at that time. She was advised to call immediately if she has any concerning symptoms in the interval. The total time spent in the appointment was 30 minutes. Disclaimer: This note was dictated with voice recognition software. Similar sounding words can inadvertently be transcribed and may be missed upon review. Lajuana Matte, MD

## 2024-01-24 ENCOUNTER — Other Ambulatory Visit: Payer: Self-pay

## 2024-01-24 ENCOUNTER — Ambulatory Visit
Admission: RE | Admit: 2024-01-24 | Discharge: 2024-01-24 | Disposition: A | Discharge status: Home / Self Care | Payer: Medicare Other | Source: Ambulatory Visit | Attending: Radiation Oncology | Admitting: Radiation Oncology

## 2024-01-24 NOTE — Progress Notes (Signed)
  Radiation Oncology         [phone] ________________________________  Name: Lindsey Williamson MRN: [id number]  Date of Service: 01/24/2024  DOB: Mar 25, 1951  Post Treatment Telephone Note  Diagnosis:  : C34.31 Malignant neoplasm of lower lobe, right bronchus or lung (as documented in provider EOT note)  The patient was available for call today.   Symptoms of fatigue have not improved since completing therapy.  Symptoms of skin changes have improved since completing therapy.  Symptoms of esophagitis have improved since completing therapy.  Patient has a moderately reduced appetite.  The patient has scheduled follow up with her medical oncologist Dr. Tonia Brooms for ongoing care, and was encouraged to call if she develops concerns or questions regarding radiation.   This concludes the interaction.  Ruel Favors, LPN

## 2024-01-25 ENCOUNTER — Inpatient Hospital Stay: Payer: Medicare Other | Attending: Internal Medicine

## 2024-01-25 ENCOUNTER — Other Ambulatory Visit: Payer: Self-pay | Admitting: Physician Assistant

## 2024-01-25 ENCOUNTER — Inpatient Hospital Stay (HOSPITAL_BASED_OUTPATIENT_CLINIC_OR_DEPARTMENT_OTHER): Payer: Medicare Other | Admitting: Physician Assistant

## 2024-01-25 VITALS — BP 142/72 | HR 101 | Temp 97.1°F | Resp 17 | Wt 148.3 lb

## 2024-01-25 DIAGNOSIS — C3431 Malignant neoplasm of lower lobe, right bronchus or lung: Secondary | ICD-10-CM | POA: Diagnosis not present

## 2024-01-25 DIAGNOSIS — Z9221 Personal history of antineoplastic chemotherapy: Secondary | ICD-10-CM | POA: Insufficient documentation

## 2024-01-25 DIAGNOSIS — C3411 Malignant neoplasm of upper lobe, right bronchus or lung: Secondary | ICD-10-CM | POA: Insufficient documentation

## 2024-01-25 DIAGNOSIS — E119 Type 2 diabetes mellitus without complications: Secondary | ICD-10-CM | POA: Insufficient documentation

## 2024-01-25 DIAGNOSIS — C7801 Secondary malignant neoplasm of right lung: Secondary | ICD-10-CM | POA: Diagnosis not present

## 2024-01-25 DIAGNOSIS — Z923 Personal history of irradiation: Secondary | ICD-10-CM | POA: Insufficient documentation

## 2024-01-25 LAB — CBC WITH DIFFERENTIAL (CANCER CENTER ONLY)
Abs Immature Granulocytes: 0.01 10*3/uL (ref 0.00–0.07)
Basophils Absolute: 0 10*3/uL (ref 0.0–0.1)
Basophils Relative: 1 %
Eosinophils Absolute: 0.5 10*3/uL (ref 0.0–0.5)
Eosinophils Relative: 8 %
HCT: 34.6 % — ABNORMAL LOW (ref 36.0–46.0)
Hemoglobin: 11 g/dL — ABNORMAL LOW (ref 12.0–15.0)
Immature Granulocytes: 0 %
Lymphocytes Relative: 20 %
Lymphs Abs: 1.1 10*3/uL (ref 0.7–4.0)
MCH: 27.2 pg (ref 26.0–34.0)
MCHC: 31.8 g/dL (ref 30.0–36.0)
MCV: 85.4 fL (ref 80.0–100.0)
Monocytes Absolute: 0.8 10*3/uL (ref 0.1–1.0)
Monocytes Relative: 14 %
Neutro Abs: 3.2 10*3/uL (ref 1.7–7.7)
Neutrophils Relative %: 57 %
Platelet Count: 228 10*3/uL (ref 150–400)
RBC: 4.05 MIL/uL (ref 3.87–5.11)
RDW: 21.5 % — ABNORMAL HIGH (ref 11.5–15.5)
WBC Count: 5.6 10*3/uL (ref 4.0–10.5)
nRBC: 0 % (ref 0.0–0.2)

## 2024-01-25 LAB — CMP (CANCER CENTER ONLY)
ALT: <5 U/L (ref 0–44)
AST: 14 U/L — ABNORMAL LOW (ref 15–41)
Albumin: 3.6 g/dL (ref 3.5–5.0)
Alkaline Phosphatase: 59 U/L (ref 38–126)
Anion gap: 9 (ref 5–15)
BUN: 18 mg/dL (ref 8–23)
CO2: 24 mmol/L (ref 22–32)
Calcium: 9.3 mg/dL (ref 8.9–10.3)
Chloride: 103 mmol/L (ref 98–111)
Creatinine: 0.7 mg/dL (ref 0.44–1.00)
GFR, Estimated: >60 mL/min (ref >60)
Glucose, Bld: 150 mg/dL — ABNORMAL HIGH (ref 70–99)
Potassium: 3.8 mmol/L (ref 3.5–5.1)
Sodium: 136 mmol/L (ref 135–145)
Total Bilirubin: 0.3 mg/dL (ref 0.0–1.2)
Total Protein: 7.6 g/dL (ref 6.5–8.1)

## 2024-01-25 MED ORDER — METHYLPREDNISOLONE 4 MG PO TBPK: ORAL_TABLET | ORAL | 0 refills | Status: DC

## 2024-01-25 NOTE — Patient Instructions (Signed)
-  We covered a lot of important information at your appointment today regarding what the treatment plan is moving forward. Here are the the main points that were discussed at your office visit with [place] today:  -The treatment will consist of a new medication. This is not chemotherapy. This new drug is a type of Immunotherapy called Imfinzi (Durvalumab).  -We are planning on starting your treatment next week on 02/08/24  -Your treatment will be given once every 4 weeks. You will receive this treatment every four weeks for a total of 1 year (26 total treatments) unless you experience unacceptable toxicity or if there is evidence on your routine CT scans that the cancer is growing  -We will get a CT scan after every 3 treatments to check on the progress of treatment  Side Effects:  -The adverse effect of the immunotherapy including but not limited to immunotherapy mediated skin rash, diarrhea, inflammation of the lung, kidney, liver, thyroid or other endocrine dysfunction  Follow up:  -We will see you back for a follow up visit in 2 weeks before undergoing cycle #1

## 2024-01-25 NOTE — Progress Notes (Signed)
DISCONTINUE OFF PATHWAY REGIMEN - Non-Small Cell Lung   [id number]:Carboplatin AUC=2 IV D1 + Paclitaxel 45 mg/m2 IV D1 q7 Days + RT:   A cycle is every 7 days, concurrent with RT:     Paclitaxel      Carboplatin   **Always confirm dose/schedule in your pharmacy ordering system**  PRIOR TREATMENT: Off Pathway: Carboplatin AUC=2 IV D1 + Paclitaxel 45 mg/m2 IV D1 q7 Days + RT  START ON PATHWAY REGIMEN - Non-Small Cell Lung     A cycle is every 28 days:     Durvalumab   **Always confirm dose/schedule in your pharmacy ordering system**  Patient Characteristics: Preoperative or Nonsurgical Candidate (Clinical Staging), Stage IIB (N2a only) or Stage III - Nonsurgical Candidate, PS = 0,1 Therapeutic Status: Preoperative or Nonsurgical Candidate (Clinical Staging) AJCC T Category: cTX AJCC N Category: cNX AJCC M Category: cM0 AJCC 9 Stage Grouping: IIIA Check here if patient was staged using an edition other than AJCC Staging 9th Edition: false ECOG Performance Status: 1 Intent of Therapy: Curative Intent, Discussed with Patient

## 2024-01-26 ENCOUNTER — Other Ambulatory Visit: Payer: Self-pay

## 2024-02-07 ENCOUNTER — Inpatient Hospital Stay: Payer: Medicare Other | Attending: Radiation Oncology | Admitting: Dietician

## 2024-02-07 DIAGNOSIS — Z5112 Encounter for antineoplastic immunotherapy: Secondary | ICD-10-CM | POA: Insufficient documentation

## 2024-02-07 DIAGNOSIS — C771 Secondary and unspecified malignant neoplasm of intrathoracic lymph nodes: Secondary | ICD-10-CM | POA: Insufficient documentation

## 2024-02-07 DIAGNOSIS — Z7982 Long term (current) use of aspirin: Secondary | ICD-10-CM | POA: Insufficient documentation

## 2024-02-07 DIAGNOSIS — Z79899 Other long term (current) drug therapy: Secondary | ICD-10-CM | POA: Insufficient documentation

## 2024-02-07 DIAGNOSIS — Z923 Personal history of irradiation: Secondary | ICD-10-CM | POA: Insufficient documentation

## 2024-02-07 DIAGNOSIS — C7801 Secondary malignant neoplasm of right lung: Secondary | ICD-10-CM | POA: Insufficient documentation

## 2024-02-07 DIAGNOSIS — R590 Localized enlarged lymph nodes: Secondary | ICD-10-CM | POA: Insufficient documentation

## 2024-02-07 DIAGNOSIS — C3411 Malignant neoplasm of upper lobe, right bronchus or lung: Secondary | ICD-10-CM | POA: Insufficient documentation

## 2024-02-07 DIAGNOSIS — M129 Arthropathy, unspecified: Secondary | ICD-10-CM | POA: Insufficient documentation

## 2024-02-07 DIAGNOSIS — E119 Type 2 diabetes mellitus without complications: Secondary | ICD-10-CM | POA: Insufficient documentation

## 2024-02-07 DIAGNOSIS — Z7984 Long term (current) use of oral hypoglycemic drugs: Secondary | ICD-10-CM | POA: Insufficient documentation

## 2024-02-07 DIAGNOSIS — J811 Chronic pulmonary edema: Secondary | ICD-10-CM | POA: Insufficient documentation

## 2024-02-07 DIAGNOSIS — I10 Essential (primary) hypertension: Secondary | ICD-10-CM | POA: Insufficient documentation

## 2024-02-07 DIAGNOSIS — I700 Atherosclerosis of aorta: Secondary | ICD-10-CM | POA: Insufficient documentation

## 2024-02-07 NOTE — Progress Notes (Signed)
Nutrition Assessment   Reason for Assessment: Poor appetite (pt request)   ASSESSMENT: 73 year old female with recurrent NSCLC. She has completed concurrent chemoradiation with carboplatin + paclitaxel (last dose 12/24). Planning consolidation immunotherapy with Imfinzi q4w. Patient is under the care of Dr. Arbutus Ped.   Past medical history includes trigeminal neuralgia, osteopenia, DM2, allergic rhinitis, hemorrhoids, HTN, HLD, chronic headaches, arthritis  Met with patient and nephew Araceli Bouche) in office. Patient reports altered taste of some foods. Things just don't taste right, specifically foods such as chicken or tuna. She is drinking 2-3 Glucerna to offset decreased intake. Patient is more focused on discussing planned immunotherapy. She does not feel she was able to grasp the what and why this has been recommended. Patient unsure of what side effects she may experience. She was given handouts with information, however reading through on her own is overwhelming.   Nutrition Focused Physical Exam: deferred    Medications: amlodipine, jardiance, pepcid, metformin, methylprednisolone, nystatin, prilosec, compazine, pravastatin   Labs: 1/29 - glucose 150   Anthropometrics: Weights stable 146-148 lb x 3 months  Height: 5'4" Weight: 148 lb 4.8 oz  UBW: 154 lb (October 2024) BMI: 25.16   NUTRITION DIAGNOSIS: Decreased appetite related to cancer and associated treatment side effects as evidenced by reported altered taste  INTERVENTION:  Educated on strategies for taste changes, suggested trying baking soda salt water rinses several times daily and before meals Continue drinking Glucerna 2x/day in between meals - samples + coupons provided  [employer] + snack ideas provided Will defer treatment related questions to Med Onc - list of pt questions sent to PA-C to address on 2/13 office visit Contact information given Support and encouragement   MONITORING, EVALUATION, GOAL: Pt  will tolerate adequate calories and protein to minimize wt loss   Next Visit: Wednesday March 12 during infusion

## 2024-02-07 NOTE — Progress Notes (Unsigned)
[facility] OFFICE PROGRESS NOTE  Lindsey Mis, MD [address] [place] [address]  DIAGNOSIS:  1) history of stage Ia non-small cell lung cancer, adenocarcinoma the right upper lobe who under went radiation under the care of Dr. Kathrynn Running from 04/23/2022-04/29/22 2 Stage IIIa confirm (T3, N2, M0) non-small cell lung cancer, squamous cell carcinoma. She presented with recurrent tumor at the right lung base, associated right lower lobe metastasis, and right hilar and subcarinal nodal metastases. This was diagnosed in October 2024.   PDL1: 85%   PRIOR THERAPY: 1) Radiation to the right upper lobe under the care of Dr. Kathrynn Running from 04/23/2022-04/29/22  2) Concurrent chemoradiation with carboplatin for an AUC of 2 and paclitaxel 45 mg/m.  Last dose on 12/20/23 Status post 7 cycle.   CURRENT THERAPY: Consolidation immunotherapy with Imfinzi 1500 mg IV every 4 weeks, first dose expected on 02/07/2024   INTERVAL HISTORY: Lindsey Williamson 73 y.o. female returns to the clinic today for a follow up visit. The patient was last seen in the clinic on 01/25/24.  Point time, we reviewed her restaging CT scan and recommended waiting 2 weeks prior to starting consolidation immunotherapy with Imfinzi to allow more recovery time from her chemoradiation.  Since being seen her cough has ***.  She was given Tessalon and a medrol dose pack.  She also underwent treatment with antibiotics due to sinus infection prescribed by her allergist as well.  Since last being seen she denies any major changes in her health.  She would like to revisit discussion about her treatment today.  She denies any fever, chills, night sweats, or unexplained weight loss.  Denies any chest pain or hemoptysis.  Shortness of breath?  She denies any nausea, vomiting, diarrhea, or constipation.  Denies any headache or visual changes.  She is here today for evaluation repeat blood work before undergoing cycle #1 of  immunotherapy with Imfinzi.       MEDICAL HISTORY: Past Medical History:  Diagnosis Date   Angioedema 10/17/2023   Arthritis    Diabetes mellitus without complication (HCC)    Hypertension     ALLERGIES:  is allergic to benzonatate, lisinopril, aspirin, azithromycin, and codeine.  MEDICATIONS:  Current Outpatient Medications  Medication Sig Dispense Refill   amLODipine (NORVASC) 5 MG tablet Take 5 mg by mouth daily.     aspirin EC 81 MG tablet Take 81 mg by mouth daily. Swallow whole.     azelastine (ASTELIN) 0.1 % nasal spray Place 2 sprays into both nostrils 2 (two) times daily as needed for rhinitis. 30 mL 5   cetirizine (ZYRTEC ALLERGY) 10 MG tablet Take 1 tablet (10 mg total) by mouth daily. 90 tablet 1   diclofenac Sodium (VOLTAREN) 1 % GEL Apply 1 application  topically 2 (two) times daily as needed (Pain).     empagliflozin (JARDIANCE) 25 MG TABS tablet Take 25 mg by mouth daily.     famotidine (PEPCID) 20 MG tablet Take 1 tablet (20 mg total) by mouth 2 (two) times daily. 180 tablet 1   metFORMIN (GLUCOPHAGE-XR) 500 MG 24 hr tablet Take 1,000 mg by mouth 2 (two) times daily.     methylPREDNISolone (MEDROL DOSEPAK) 4 MG TBPK tablet Use as instructed 21 tablet 0   nystatin (MYCOSTATIN/NYSTOP) powder Apply 1 Application topically 3 (three) times daily. 15 g 0   omeprazole (PRILOSEC) 20 MG capsule Take 1 capsule (20 mg total) by mouth daily. 30 capsule 2  pravastatin (PRAVACHOL) 80 MG tablet Take 80 mg by mouth daily.     prochlorperazine (COMPAZINE) 10 MG tablet TAKE 1 TABLET(10 MG) BY MOUTH EVERY 6 HOURS AS NEEDED 30 tablet 2   sucralfate (CARAFATE) 1 g tablet TAKE 1 TABLET(1 GRAM) BY MOUTH FOUR TIMES DAILY(WITH MEALS AND AT BEDTIME) 120 tablet 0   No current facility-administered medications for this visit.    SURGICAL HISTORY:  Past Surgical History:  Procedure Laterality Date   BRONCHIAL BIOPSY  03/30/2022   Procedure: BRONCHIAL BIOPSIES;  Surgeon: Josephine Igo, DO;  Location: [facility] ENDOSCOPY;  Service: Pulmonary;;   BRONCHIAL BIOPSY  10/17/2023   Procedure: BRONCHIAL BIOPSIES;  Surgeon: Leslye Peer, MD;  Location: [facility] ENDOSCOPY;  Service: Pulmonary;;   BRONCHIAL BRUSHINGS  03/30/2022   Procedure: BRONCHIAL BRUSHINGS;  Surgeon: Josephine Igo, DO;  Location: [facility] ENDOSCOPY;  Service: Pulmonary;;   BRONCHIAL BRUSHINGS  10/17/2023   Procedure: BRONCHIAL BRUSHINGS;  Surgeon: Leslye Peer, MD;  Location: [facility] ENDOSCOPY;  Service: Pulmonary;;   BRONCHIAL NEEDLE ASPIRATION BIOPSY  03/30/2022   Procedure: BRONCHIAL NEEDLE ASPIRATION BIOPSIES;  Surgeon: Josephine Igo, DO;  Location: [facility] ENDOSCOPY;  Service: Pulmonary;;   BRONCHIAL NEEDLE ASPIRATION BIOPSY  10/17/2023   Procedure: BRONCHIAL NEEDLE ASPIRATION BIOPSIES;  Surgeon: Leslye Peer, MD;  Location: [facility] ENDOSCOPY;  Service: Pulmonary;;   EYE SURGERY     FIDUCIAL MARKER PLACEMENT  03/30/2022   Procedure: FIDUCIAL MARKER PLACEMENT;  Surgeon: Josephine Igo, DO;  Location: [facility] ENDOSCOPY;  Service: Pulmonary;;   FINE NEEDLE ASPIRATION  10/17/2023   Procedure: FINE NEEDLE ASPIRATION;  Surgeon: Leslye Peer, MD;  Location: [facility] ENDOSCOPY;  Service: Pulmonary;;   FOREIGN BODY REMOVAL  03/30/2022   Procedure: FOREIGN BODY REMOVAL;  Surgeon: Josephine Igo, DO;  Location: [facility] ENDOSCOPY;  Service: Pulmonary;;   HEMOSTASIS CONTROL  10/17/2023   Procedure: HEMOSTASIS CONTROL;  Surgeon: Leslye Peer, MD;  Location: [facility] ENDOSCOPY;  Service: Pulmonary;;   VIDEO BRONCHOSCOPY WITH ENDOBRONCHIAL ULTRASOUND Bilateral 03/30/2022   Procedure: VIDEO BRONCHOSCOPY WITH ENDOBRONCHIAL ULTRASOUND;  Surgeon: Josephine Igo, DO;  Location: [facility] ENDOSCOPY;  Service: Pulmonary;  Laterality: Bilateral;   VIDEO BRONCHOSCOPY WITH ENDOBRONCHIAL ULTRASOUND Right 10/17/2023   Procedure: VIDEO BRONCHOSCOPY WITH ENDOBRONCHIAL ULTRASOUND;  Surgeon: Leslye Peer, MD;  Location: [facility] ENDOSCOPY;  Service: Pulmonary;  Laterality: Right;    VIDEO BRONCHOSCOPY WITH RADIAL ENDOBRONCHIAL ULTRASOUND  03/30/2022   Procedure: VIDEO BRONCHOSCOPY WITH RADIAL ENDOBRONCHIAL ULTRASOUND;  Surgeon: Josephine Igo, DO;  Location: [facility] ENDOSCOPY;  Service: Pulmonary;;    REVIEW OF SYSTEMS:   Review of Systems  Constitutional: Negative for appetite change, chills, fatigue, fever and unexpected weight change.  HENT:   Negative for mouth sores, nosebleeds, sore throat and trouble swallowing.   Eyes: Negative for eye problems and icterus.  Respiratory: Negative for cough, hemoptysis, shortness of breath and wheezing.   Cardiovascular: Negative for chest pain and leg swelling.  Gastrointestinal: Negative for abdominal pain, constipation, diarrhea, nausea and vomiting.  Genitourinary: Negative for bladder incontinence, difficulty urinating, dysuria, frequency and hematuria.   Musculoskeletal: Negative for back pain, gait problem, neck pain and neck stiffness.  Skin: Negative for itching and rash.  Neurological: Negative for dizziness, extremity weakness, gait problem, headaches, light-headedness and seizures.  Hematological: Negative for adenopathy. Does not bruise/bleed easily.  Psychiatric/Behavioral: Negative for confusion, depression and sleep disturbance. The patient is not nervous/anxious.     PHYSICAL EXAMINATION:  There were no vitals taken for  this visit.  ECOG PERFORMANCE STATUS: {CHL ONC ECOG [id number]  Physical Exam  Constitutional: Oriented to person, place, and time and well-developed, well-nourished, and in no distress. No distress.  HENT:  Head: Normocephalic and atraumatic.  Mouth/Throat: Oropharynx is clear and moist. No oropharyngeal exudate.  Eyes: Conjunctivae are normal. Right eye exhibits no discharge. Left eye exhibits no discharge. No scleral icterus.  Neck: Normal range of motion. Neck supple.  Cardiovascular: Normal rate, regular rhythm, normal heart sounds and intact distal pulses.   Pulmonary/Chest: Effort  normal and breath sounds normal. No respiratory distress. No wheezes. No rales.  Abdominal: Soft. Bowel sounds are normal. Exhibits no distension and no mass. There is no tenderness.  Musculoskeletal: Normal range of motion. Exhibits no edema.  Lymphadenopathy:    No cervical adenopathy.  Neurological: Alert and oriented to person, place, and time. Exhibits normal muscle tone. Gait normal. Coordination normal.  Skin: Skin is warm and dry. No rash noted. Not diaphoretic. No erythema. No pallor.  Psychiatric: Mood, memory and judgment normal.  Vitals reviewed.  LABORATORY DATA: Lab Results  Component Value Date   WBC 5.6 01/25/2024   HGB 11.0 (L) 01/25/2024   HCT 34.6 (L) 01/25/2024   MCV 85.4 01/25/2024   PLT 228 01/25/2024      Chemistry      Component Value Date/Time   NA 136 01/25/2024 0816   K 3.8 01/25/2024 0816   CL 103 01/25/2024 0816   CO2 24 01/25/2024 0816   BUN 18 01/25/2024 0816   CREATININE 0.70 01/25/2024 0816      Component Value Date/Time   CALCIUM 9.3 01/25/2024 0816   ALKPHOS 59 01/25/2024 0816   AST 14 (L) 01/25/2024 0816   ALT <5 01/25/2024 0816   BILITOT 0.3 01/25/2024 0816       RADIOGRAPHIC STUDIES:  CT Chest W Contrast Result Date: 01/21/2024 CLINICAL DATA:  Non-small cell lung cancer restaging. * Tracking Code: BO * EXAM: CT CHEST WITH CONTRAST TECHNIQUE: Multidetector CT imaging of the chest was performed during intravenous contrast administration. RADIATION DOSE REDUCTION: This exam was performed according to the departmental dose-optimization program which includes automated exposure control, adjustment of the mA and/or kV according to patient size and/or use of iterative reconstruction technique. CONTRAST:  75mL OMNIPAQUE IOHEXOL 300 MG/ML  SOLN COMPARISON:  Chest x-ray 09/23/2023 and PET-CT 10/10/2023 FINDINGS: Cardiovascular: The heart is normal in size. No pericardial effusion. Stable tortuosity, ectasia and calcification of the thoracic  aorta. No dissection. The branch vessels are patent. Stable three-vessel coronary artery calcifications. Mediastinum/Nodes: Persistent mediastinal and hilar lymphadenopathy. Precarinal lymph node measures 8 mm on image 59/2 and previously measured 10 mm. Right paratracheal node measures 8 mm on image 57/2 and previously measured 8 mm. Right hilar adenopathy. Maximum transverse dimension on image 67/2 is 15 mm. This was previously 15 mm. Bulk E subcarinal adenopathy surrounding the bronchus intermedius measures 5.5 x 3.7 cm on image 73/2 and previously measured 5.5 x 3.7 cm. Stable left hilar lymph nodes. The esophagus is grossly normal. Lungs/Pleura: Stable emphysematous changes and pulmonary scarring. Peripheral interstitial thickening and subpleural reticulation with probable honeycombing and chronic interstitial disease. There are stable small scattered ground-glass nodules in both lungs. No new or progressive findings. The large masslike area of right lower lobe consolidation and air bronchograms appears progressive and may be related to progressive radiation pneumonitis/fibrosis. The medial masslike area near the fiducials is difficult to measure but appears grossly stable. Upper Abdomen: No significant upper abdominal findings.  No hepatic or adrenal gland lesions. No upper abdominal adenopathy. Stable vascular disease. Musculoskeletal: No significant bony findings. Remote L1 compression fracture. IMPRESSION: 1. Stable mediastinal and hilar lymphadenopathy. 2. The large masslike area of right lower lobe consolidation and air bronchograms appears progressive and may be related to progressive radiation pneumonitis/fibrosis. The medial masslike area near the fiducials is difficult to measure but appears grossly stable. Recommend continued CT surveillance versus follow-up PET-CT. 3. Stable small scattered ground-glass nodules in both lungs. 4. Stable emphysematous changes and pulmonary scarring. 5. Stable  peripheral interstitial thickening and subpleural reticulation with probable honeycombing and chronic interstitial disease. 6. Stable tortuosity, ectasia and calcification of the thoracic aorta. 7. Stable three-vessel coronary artery calcifications. Aortic Atherosclerosis (ICD10-I70.0) and Emphysema (ICD10-J43.9). Electronically Signed   By: Rudie Meyer M.D.   On: 01/21/2024 21:05     ASSESSMENT/PLAN:  1) history of stage Ia non-small cell lung cancer, adenocarcinoma the right upper lobe who under went radiation under the care of Dr. Kathrynn Running from 04/23/2022-04/29/22 2 Stage IIIa confirm (T3, N2, M0) non-small cell lung cancer, squamous cell carcinoma. She presented with recurrent tumor at the right lung base, associated right lower lobe metastasis, and right hilar and subcarinal nodal metastases. This was diagnosed in October 2024.    PDL1 expression 85%.   The patient is scheduled to undergo consolidation immunotherapy with Imfinzi 1500 mg IV every 4 weeks starting today as long as her cough improves.  The patient was seen with Dr. Arbutus Ped.  She is feeling ***at this time.  Labs were reviewed.  She will ***with immunotherapy today as scheduled.  We will see her back for follow-up visit in 4 weeks for evaluation repeat blood work before undergoing cycle #2.  We reviewed possible side effects, logistics, and mechanism of action regarding immunotherapy.  The patient was advised to call immediately if she has any concerning symptoms in the interval. The patient voices understanding of current disease status and treatment options and is in agreement with the current care plan. All questions were answered. The patient knows to call the clinic with any problems, questions or concerns. We can certainly see the patient much sooner if necessary   No orders of the defined types were placed in this encounter.    I spent {CHL ONC TIME VISIT - EAVWU:[id number]} counseling the patient face to face. The  total time spent in the appointment was {CHL ONC TIME VISIT - NFAOZ:[id number]}.  Torien Ramroop L Alek Poncedeleon, PA-C 02/07/24

## 2024-02-09 ENCOUNTER — Inpatient Hospital Stay: Payer: Medicare Other

## 2024-02-09 ENCOUNTER — Inpatient Hospital Stay (HOSPITAL_BASED_OUTPATIENT_CLINIC_OR_DEPARTMENT_OTHER): Payer: Medicare Other | Admitting: Physician Assistant

## 2024-02-09 VITALS — BP 175/72 | HR 101 | Temp 98.9°F | Resp 17 | Wt 145.3 lb

## 2024-02-09 VITALS — BP 136/74 | HR 93 | Resp 16

## 2024-02-09 DIAGNOSIS — M129 Arthropathy, unspecified: Secondary | ICD-10-CM | POA: Diagnosis not present

## 2024-02-09 DIAGNOSIS — Z923 Personal history of irradiation: Secondary | ICD-10-CM | POA: Diagnosis not present

## 2024-02-09 DIAGNOSIS — J811 Chronic pulmonary edema: Secondary | ICD-10-CM | POA: Diagnosis not present

## 2024-02-09 DIAGNOSIS — Z7982 Long term (current) use of aspirin: Secondary | ICD-10-CM | POA: Diagnosis not present

## 2024-02-09 DIAGNOSIS — Z7984 Long term (current) use of oral hypoglycemic drugs: Secondary | ICD-10-CM | POA: Diagnosis not present

## 2024-02-09 DIAGNOSIS — R590 Localized enlarged lymph nodes: Secondary | ICD-10-CM | POA: Diagnosis not present

## 2024-02-09 DIAGNOSIS — E119 Type 2 diabetes mellitus without complications: Secondary | ICD-10-CM | POA: Diagnosis not present

## 2024-02-09 DIAGNOSIS — Z5112 Encounter for antineoplastic immunotherapy: Secondary | ICD-10-CM

## 2024-02-09 DIAGNOSIS — C3431 Malignant neoplasm of lower lobe, right bronchus or lung: Secondary | ICD-10-CM | POA: Diagnosis not present

## 2024-02-09 DIAGNOSIS — I10 Essential (primary) hypertension: Secondary | ICD-10-CM | POA: Diagnosis not present

## 2024-02-09 DIAGNOSIS — C771 Secondary and unspecified malignant neoplasm of intrathoracic lymph nodes: Secondary | ICD-10-CM | POA: Diagnosis not present

## 2024-02-09 DIAGNOSIS — I700 Atherosclerosis of aorta: Secondary | ICD-10-CM | POA: Diagnosis not present

## 2024-02-09 DIAGNOSIS — Z79899 Other long term (current) drug therapy: Secondary | ICD-10-CM | POA: Diagnosis not present

## 2024-02-09 DIAGNOSIS — C3411 Malignant neoplasm of upper lobe, right bronchus or lung: Secondary | ICD-10-CM | POA: Diagnosis not present

## 2024-02-09 DIAGNOSIS — C7801 Secondary malignant neoplasm of right lung: Secondary | ICD-10-CM | POA: Diagnosis not present

## 2024-02-09 LAB — TSH: TSH: 1.47 u[IU]/mL (ref 0.350–4.500)

## 2024-02-09 LAB — CMP (CANCER CENTER ONLY)
ALT: <5 U/L (ref 0–44)
AST: 15 U/L (ref 15–41)
Albumin: 3.6 g/dL (ref 3.5–5.0)
Alkaline Phosphatase: 63 U/L (ref 38–126)
Anion gap: 9 (ref 5–15)
BUN: 16 mg/dL (ref 8–23)
CO2: 23 mmol/L (ref 22–32)
Calcium: 9.4 mg/dL (ref 8.9–10.3)
Chloride: 103 mmol/L (ref 98–111)
Creatinine: 0.64 mg/dL (ref 0.44–1.00)
GFR, Estimated: >60 mL/min (ref >60)
Glucose, Bld: 188 mg/dL — ABNORMAL HIGH (ref 70–99)
Potassium: 3.8 mmol/L (ref 3.5–5.1)
Sodium: 135 mmol/L (ref 135–145)
Total Bilirubin: 0.4 mg/dL (ref 0.0–1.2)
Total Protein: 7.7 g/dL (ref 6.5–8.1)

## 2024-02-09 LAB — CBC WITH DIFFERENTIAL (CANCER CENTER ONLY)
Abs Immature Granulocytes: 0.01 10*3/uL (ref 0.00–0.07)
Basophils Absolute: 0 10*3/uL (ref 0.0–0.1)
Basophils Relative: 1 %
Eosinophils Absolute: 0.3 10*3/uL (ref 0.0–0.5)
Eosinophils Relative: 6 %
HCT: 35.9 % — ABNORMAL LOW (ref 36.0–46.0)
Hemoglobin: 11.4 g/dL — ABNORMAL LOW (ref 12.0–15.0)
Immature Granulocytes: 0 %
Lymphocytes Relative: 13 %
Lymphs Abs: 0.6 10*3/uL — ABNORMAL LOW (ref 0.7–4.0)
MCH: 27.1 pg (ref 26.0–34.0)
MCHC: 31.8 g/dL (ref 30.0–36.0)
MCV: 85.3 fL (ref 80.0–100.0)
Monocytes Absolute: 0.7 10*3/uL (ref 0.1–1.0)
Monocytes Relative: 16 %
Neutro Abs: 3.1 10*3/uL (ref 1.7–7.7)
Neutrophils Relative %: 64 %
Platelet Count: 252 10*3/uL (ref 150–400)
RBC: 4.21 MIL/uL (ref 3.87–5.11)
RDW: 19.8 % — ABNORMAL HIGH (ref 11.5–15.5)
WBC Count: 4.8 10*3/uL (ref 4.0–10.5)
nRBC: 0 % (ref 0.0–0.2)

## 2024-02-09 MED ORDER — SODIUM CHLORIDE 0.9 % IV SOLN: INTRAVENOUS | Status: DC

## 2024-02-09 MED ORDER — SODIUM CHLORIDE 0.9 % IV SOLN
1500.0000 mg | Freq: Once | INTRAVENOUS | Status: AC
  Administered 2024-02-09: 1500 mg via INTRAVENOUS
  Filled 2024-02-09: qty 30

## 2024-02-09 NOTE — Patient Instructions (Signed)
[facility] - A DEPT OF [facility]  Discharge Instructions: Thank you for choosing [facility] to provide your oncology and hematology care.   If you have a lab appointment with the Cancer Center, please go directly to the Cancer Center and check in at the registration area.   Wear comfortable clothing and clothing appropriate for easy access to any Portacath or PICC line.   We strive to give you quality time with your provider. You may need to reschedule your appointment if you arrive late (15 or more minutes).  Arriving late affects you and other patients whose appointments are after yours.  Also, if you miss three or more appointments without notifying the office, you may be dismissed from the clinic at the provider's discretion.      For prescription refill requests, have your pharmacy contact our office and allow 72 hours for refills to be completed.    Today you received the following chemotherapy and/or immunotherapy agents: imfinzi   To help prevent nausea and vomiting after your treatment, we encourage you to take your nausea medication as directed.  BELOW ARE SYMPTOMS THAT SHOULD BE REPORTED IMMEDIATELY: *FEVER GREATER THAN 100.4 F (38 C) OR HIGHER *CHILLS OR SWEATING *NAUSEA AND VOMITING THAT IS NOT CONTROLLED WITH YOUR NAUSEA MEDICATION *UNUSUAL SHORTNESS OF BREATH *UNUSUAL BRUISING OR BLEEDING *URINARY PROBLEMS (pain or burning when urinating, or frequent urination) *BOWEL PROBLEMS (unusual diarrhea, constipation, pain near the anus) TENDERNESS IN MOUTH AND THROAT WITH OR WITHOUT PRESENCE OF ULCERS (sore throat, sores in mouth, or a toothache) UNUSUAL RASH, SWELLING OR PAIN  UNUSUAL VAGINAL DISCHARGE OR ITCHING   Items with * indicate a potential emergency and should be followed up as soon as possible or go to the Emergency Department if any problems should occur.  Please show the CHEMOTHERAPY ALERT CARD or IMMUNOTHERAPY ALERT  CARD at check-in to the Emergency Department and triage nurse.  Should you have questions after your visit or need to cancel or reschedule your appointment, please contact [facility] - A DEPT OF [facility]  Dept: [phone]  and follow the prompts.  Office hours are 8:00 a.m. to 4:30 p.m. Monday - Friday. Please note that voicemails left after 4:00 p.m. may not be returned until the following business day.  We are closed weekends and major holidays. You have access to a nurse at all times for urgent questions. Please call the main number to the clinic Dept: [phone] and follow the prompts.   For any non-urgent questions, you may also contact your provider using MyChart. We now offer e-Visits for anyone 65 and older to request care online for non-urgent symptoms. For details visit [email or web address].   Also download the MyChart app! Go to the app store, search "MyChart", open the app, select [facility], and log in with your MyChart username and password.

## 2024-02-10 ENCOUNTER — Other Ambulatory Visit: Payer: Self-pay | Admitting: Physician Assistant

## 2024-02-10 DIAGNOSIS — K30 Functional dyspepsia: Secondary | ICD-10-CM

## 2024-02-10 LAB — T4: T4, Total: 9.9 ug/dL (ref 4.5–12.0)

## 2024-02-13 ENCOUNTER — Encounter (HOSPITAL_COMMUNITY): Payer: Self-pay

## 2024-02-13 ENCOUNTER — Other Ambulatory Visit: Payer: Self-pay

## 2024-02-13 ENCOUNTER — Inpatient Hospital Stay (HOSPITAL_COMMUNITY)
Admission: EM | Admit: 2024-02-13 | Discharge: 2024-02-18 | DRG: 205 | Disposition: A | Discharge status: Home / Self Care | Payer: Medicare Other | Attending: Internal Medicine | Admitting: Internal Medicine

## 2024-02-13 ENCOUNTER — Emergency Department (HOSPITAL_COMMUNITY): Payer: Medicare Other

## 2024-02-13 DIAGNOSIS — E1165 Type 2 diabetes mellitus with hyperglycemia: Secondary | ICD-10-CM | POA: Diagnosis present

## 2024-02-13 DIAGNOSIS — K219 Gastro-esophageal reflux disease without esophagitis: Secondary | ICD-10-CM | POA: Diagnosis present

## 2024-02-13 DIAGNOSIS — J9601 Acute respiratory failure with hypoxia: Secondary | ICD-10-CM | POA: Diagnosis present

## 2024-02-13 DIAGNOSIS — Z951 Presence of aortocoronary bypass graft: Secondary | ICD-10-CM

## 2024-02-13 DIAGNOSIS — Z1152 Encounter for screening for COVID-19: Secondary | ICD-10-CM | POA: Diagnosis not present

## 2024-02-13 DIAGNOSIS — Z881 Allergy status to other antibiotic agents status: Secondary | ICD-10-CM | POA: Diagnosis not present

## 2024-02-13 DIAGNOSIS — Z923 Personal history of irradiation: Secondary | ICD-10-CM

## 2024-02-13 DIAGNOSIS — Z7984 Long term (current) use of oral hypoglycemic drugs: Secondary | ICD-10-CM | POA: Diagnosis not present

## 2024-02-13 DIAGNOSIS — Z888 Allergy status to other drugs, medicaments and biological substances status: Secondary | ICD-10-CM

## 2024-02-13 DIAGNOSIS — J704 Drug-induced interstitial lung disorders, unspecified: Principal | ICD-10-CM | POA: Diagnosis present

## 2024-02-13 DIAGNOSIS — Z87891 Personal history of nicotine dependence: Secondary | ICD-10-CM

## 2024-02-13 DIAGNOSIS — E785 Hyperlipidemia, unspecified: Secondary | ICD-10-CM | POA: Diagnosis present

## 2024-02-13 DIAGNOSIS — C3490 Malignant neoplasm of unspecified part of unspecified bronchus or lung: Secondary | ICD-10-CM | POA: Diagnosis present

## 2024-02-13 DIAGNOSIS — J849 Interstitial pulmonary disease, unspecified: Secondary | ICD-10-CM | POA: Diagnosis present

## 2024-02-13 DIAGNOSIS — Z794 Long term (current) use of insulin: Secondary | ICD-10-CM

## 2024-02-13 DIAGNOSIS — K5900 Constipation, unspecified: Secondary | ICD-10-CM | POA: Diagnosis present

## 2024-02-13 DIAGNOSIS — I10 Essential (primary) hypertension: Secondary | ICD-10-CM | POA: Diagnosis present

## 2024-02-13 DIAGNOSIS — F419 Anxiety disorder, unspecified: Secondary | ICD-10-CM | POA: Diagnosis present

## 2024-02-13 DIAGNOSIS — Z85118 Personal history of other malignant neoplasm of bronchus and lung: Secondary | ICD-10-CM | POA: Diagnosis not present

## 2024-02-13 DIAGNOSIS — I2510 Atherosclerotic heart disease of native coronary artery without angina pectoris: Secondary | ICD-10-CM | POA: Diagnosis present

## 2024-02-13 DIAGNOSIS — R0902 Hypoxemia: Secondary | ICD-10-CM | POA: Diagnosis present

## 2024-02-13 DIAGNOSIS — Z7982 Long term (current) use of aspirin: Secondary | ICD-10-CM | POA: Diagnosis not present

## 2024-02-13 DIAGNOSIS — R000 Tachycardia, unspecified: Secondary | ICD-10-CM | POA: Diagnosis present

## 2024-02-13 DIAGNOSIS — E1129 Type 2 diabetes mellitus with other diabetic kidney complication: Secondary | ICD-10-CM

## 2024-02-13 DIAGNOSIS — K30 Functional dyspepsia: Secondary | ICD-10-CM

## 2024-02-13 DIAGNOSIS — Z79899 Other long term (current) drug therapy: Secondary | ICD-10-CM

## 2024-02-13 DIAGNOSIS — T380X5A Adverse effect of glucocorticoids and synthetic analogues, initial encounter: Secondary | ICD-10-CM | POA: Diagnosis present

## 2024-02-13 DIAGNOSIS — Z885 Allergy status to narcotic agent status: Secondary | ICD-10-CM

## 2024-02-13 DIAGNOSIS — E876 Hypokalemia: Secondary | ICD-10-CM | POA: Diagnosis present

## 2024-02-13 DIAGNOSIS — J189 Pneumonia, unspecified organism: Secondary | ICD-10-CM | POA: Diagnosis not present

## 2024-02-13 DIAGNOSIS — J159 Unspecified bacterial pneumonia: Secondary | ICD-10-CM | POA: Diagnosis present

## 2024-02-13 DIAGNOSIS — F32A Depression, unspecified: Secondary | ICD-10-CM | POA: Diagnosis present

## 2024-02-13 DIAGNOSIS — Z886 Allergy status to analgesic agent status: Secondary | ICD-10-CM

## 2024-02-13 DIAGNOSIS — T451X5A Adverse effect of antineoplastic and immunosuppressive drugs, initial encounter: Secondary | ICD-10-CM | POA: Diagnosis present

## 2024-02-13 DIAGNOSIS — R0602 Shortness of breath: Secondary | ICD-10-CM | POA: Diagnosis not present

## 2024-02-13 LAB — CBC
HCT: 36.9 % (ref 36.0–46.0)
Hemoglobin: 11.5 g/dL — ABNORMAL LOW (ref 12.0–15.0)
MCH: 27.3 pg (ref 26.0–34.0)
MCHC: 31.2 g/dL (ref 30.0–36.0)
MCV: 87.4 fL (ref 80.0–100.0)
Platelets: 277 10*3/uL (ref 150–400)
RBC: 4.22 MIL/uL (ref 3.87–5.11)
RDW: 18.9 % — ABNORMAL HIGH (ref 11.5–15.5)
WBC: 4.7 10*3/uL (ref 4.0–10.5)
nRBC: 0 % (ref 0.0–0.2)

## 2024-02-13 LAB — RESP PANEL BY RT-PCR (RSV, FLU A&B, COVID)  RVPGX2
Influenza A by PCR: NEGATIVE
Influenza B by PCR: NEGATIVE
Resp Syncytial Virus by PCR: NEGATIVE
SARS Coronavirus 2 by RT PCR: NEGATIVE

## 2024-02-13 LAB — COMPREHENSIVE METABOLIC PANEL
ALT: 7 U/L (ref 0–44)
AST: 22 U/L (ref 15–41)
Albumin: 2.9 g/dL — ABNORMAL LOW (ref 3.5–5.0)
Alkaline Phosphatase: 72 U/L (ref 38–126)
Anion gap: 14 (ref 5–15)
BUN: 29 mg/dL — ABNORMAL HIGH (ref 8–23)
CO2: 17 mmol/L — ABNORMAL LOW (ref 22–32)
Calcium: 8.6 mg/dL — ABNORMAL LOW (ref 8.9–10.3)
Chloride: 101 mmol/L (ref 98–111)
Creatinine, Ser: 0.61 mg/dL (ref 0.44–1.00)
GFR, Estimated: >60 mL/min (ref >60)
Glucose, Bld: 178 mg/dL — ABNORMAL HIGH (ref 70–99)
Potassium: 3.2 mmol/L — ABNORMAL LOW (ref 3.5–5.1)
Sodium: 132 mmol/L — ABNORMAL LOW (ref 135–145)
Total Bilirubin: 0.6 mg/dL (ref 0.0–1.2)
Total Protein: 7.9 g/dL (ref 6.5–8.1)

## 2024-02-13 LAB — I-STAT CG4 LACTIC ACID, ED
Lactic Acid, Venous: 1.6 mmol/L (ref 0.5–1.9)
Lactic Acid, Venous: 1.6 mmol/L (ref 0.5–1.9)

## 2024-02-13 MED ORDER — FAMOTIDINE 20 MG PO TABS: 20.0000 mg | ORAL_TABLET | Freq: Two times a day (BID) | ORAL | Status: DC | PRN

## 2024-02-13 MED ORDER — ENOXAPARIN SODIUM 40 MG/0.4ML IJ SOSY
40.0000 mg | PREFILLED_SYRINGE | INTRAMUSCULAR | Status: DC
  Administered 2024-02-13 – 2024-02-17 (×5): 40 mg via SUBCUTANEOUS
  Filled 2024-02-13 (×5): qty 0.4

## 2024-02-13 MED ORDER — TRAZODONE HCL 50 MG PO TABS
50.0000 mg | ORAL_TABLET | Freq: Every evening | ORAL | Status: DC | PRN
  Administered 2024-02-14 – 2024-02-17 (×4): 50 mg via ORAL
  Filled 2024-02-13 (×5): qty 1

## 2024-02-13 MED ORDER — ALBUTEROL SULFATE (2.5 MG/3ML) 0.083% IN NEBU
2.5000 mg | INHALATION_SOLUTION | Freq: Four times a day (QID) | RESPIRATORY_TRACT | Status: DC
  Administered 2024-02-13: 2.5 mg via RESPIRATORY_TRACT
  Filled 2024-02-13: qty 3

## 2024-02-13 MED ORDER — SODIUM CHLORIDE 0.9 % IV SOLN
2.0000 g | INTRAVENOUS | Status: DC
  Administered 2024-02-13 – 2024-02-17 (×5): 2 g via INTRAVENOUS
  Filled 2024-02-13 (×5): qty 20

## 2024-02-13 MED ORDER — ARFORMOTEROL TARTRATE 15 MCG/2ML IN NEBU
15.0000 ug | INHALATION_SOLUTION | Freq: Two times a day (BID) | RESPIRATORY_TRACT | Status: DC
  Administered 2024-02-13 – 2024-02-14 (×2): 15 ug via RESPIRATORY_TRACT
  Filled 2024-02-13 (×2): qty 2

## 2024-02-13 MED ORDER — AMLODIPINE BESYLATE 5 MG PO TABS
2.5000 mg | ORAL_TABLET | Freq: Every day | ORAL | Status: DC
  Administered 2024-02-14 – 2024-02-18 (×5): 2.5 mg via ORAL
  Filled 2024-02-13 (×5): qty 1

## 2024-02-13 MED ORDER — IOHEXOL 350 MG/ML SOLN
75.0000 mL | Freq: Once | INTRAVENOUS | Status: AC | PRN
  Administered 2024-02-13: 75 mL via INTRAVENOUS

## 2024-02-13 MED ORDER — ASPIRIN 81 MG PO TBEC
81.0000 mg | DELAYED_RELEASE_TABLET | Freq: Every day | ORAL | Status: DC
  Administered 2024-02-14 – 2024-02-18 (×5): 81 mg via ORAL
  Filled 2024-02-13 (×5): qty 1

## 2024-02-13 MED ORDER — ALBUTEROL SULFATE (2.5 MG/3ML) 0.083% IN NEBU: 2.5000 mg | INHALATION_SOLUTION | Freq: Two times a day (BID) | RESPIRATORY_TRACT | Status: DC

## 2024-02-13 MED ORDER — POTASSIUM CHLORIDE CRYS ER 20 MEQ PO TBCR
40.0000 meq | EXTENDED_RELEASE_TABLET | Freq: Once | ORAL | Status: AC
  Administered 2024-02-13: 40 meq via ORAL
  Filled 2024-02-13: qty 2

## 2024-02-13 MED ORDER — IPRATROPIUM-ALBUTEROL 0.5-2.5 (3) MG/3ML IN SOLN
3.0000 mL | Freq: Four times a day (QID) | RESPIRATORY_TRACT | Status: DC
  Administered 2024-02-14 (×3): 3 mL via RESPIRATORY_TRACT
  Filled 2024-02-13 (×3): qty 3

## 2024-02-13 MED ORDER — BISOPROLOL FUMARATE 5 MG PO TABS
2.5000 mg | ORAL_TABLET | Freq: Every day | ORAL | Status: DC
  Administered 2024-02-14: 2.5 mg via ORAL
  Filled 2024-02-13: qty 1

## 2024-02-13 MED ORDER — SODIUM CHLORIDE (PF) 0.9 % IJ SOLN
INTRAMUSCULAR | Status: AC
  Filled 2024-02-13: qty 50

## 2024-02-13 MED ORDER — ACETAMINOPHEN 325 MG PO TABS
650.0000 mg | ORAL_TABLET | Freq: Four times a day (QID) | ORAL | Status: DC | PRN
  Administered 2024-02-13 – 2024-02-14 (×2): 650 mg via ORAL
  Filled 2024-02-13 (×2): qty 2

## 2024-02-13 MED ORDER — PANTOPRAZOLE SODIUM 40 MG PO TBEC
40.0000 mg | DELAYED_RELEASE_TABLET | Freq: Every day | ORAL | Status: DC
  Administered 2024-02-14: 40 mg via ORAL
  Filled 2024-02-13: qty 1

## 2024-02-13 MED ORDER — PRAVASTATIN SODIUM 20 MG PO TABS
80.0000 mg | ORAL_TABLET | Freq: Every day | ORAL | Status: DC
  Administered 2024-02-14 – 2024-02-17 (×4): 80 mg via ORAL
  Filled 2024-02-13 (×5): qty 4

## 2024-02-13 MED ORDER — IPRATROPIUM-ALBUTEROL 0.5-2.5 (3) MG/3ML IN SOLN
3.0000 mL | Freq: Once | RESPIRATORY_TRACT | Status: AC
  Administered 2024-02-13: 3 mL via RESPIRATORY_TRACT
  Filled 2024-02-13: qty 3

## 2024-02-13 MED ORDER — SODIUM CHLORIDE 0.9 % IV BOLUS
1000.0000 mL | Freq: Once | INTRAVENOUS | Status: AC
  Administered 2024-02-13: 1000 mL via INTRAVENOUS

## 2024-02-13 MED ORDER — HYDROCODONE BIT-HOMATROP MBR 5-1.5 MG/5ML PO SOLN
5.0000 mL | ORAL | Status: DC | PRN
  Administered 2024-02-13 – 2024-02-17 (×9): 5 mL via ORAL
  Filled 2024-02-13 (×9): qty 5

## 2024-02-13 MED ORDER — DOXYCYCLINE HYCLATE 100 MG PO TABS
100.0000 mg | ORAL_TABLET | Freq: Two times a day (BID) | ORAL | Status: DC
  Administered 2024-02-13 – 2024-02-18 (×10): 100 mg via ORAL
  Filled 2024-02-13 (×10): qty 1

## 2024-02-13 MED ORDER — BUDESONIDE 0.25 MG/2ML IN SUSP
0.2500 mg | Freq: Two times a day (BID) | RESPIRATORY_TRACT | Status: DC
  Administered 2024-02-13 – 2024-02-18 (×10): 0.25 mg via RESPIRATORY_TRACT
  Filled 2024-02-13 (×10): qty 2

## 2024-02-13 MED ORDER — ACETAMINOPHEN 650 MG RE SUPP: 650.0000 mg | Freq: Four times a day (QID) | RECTAL | Status: DC | PRN

## 2024-02-13 NOTE — ED Provider Notes (Signed)
  EMERGENCY DEPARTMENT AT [facility] Provider Note   CSN: [id number] Arrival date & time: 02/13/24  1150     History  Chief Complaint  Patient presents with   Shortness of Breath    Lindsey Williamson is a 73 y.o. female patient with past medical history of right lower lobe lung cancer on treatment with chemotherapy and radiation, type 2 diabetes, depression, hypertension, hyperlipidemia presenting to emergency room with complaint of shortness of breath.  Patient reports she has been short of breath for the past 4 days.  Has progressively worsened.  Today she had primary care visit in which she was found to have 80% oxygen on room air.  Upon arrival to emergency room is difficult for her to speak, after being placed on 2 L nasal cannula her shortness of breath and respiratory effort significantly improved.  Patient reports for the past 4 weeks she has had worsening cough although she is unsure if this is related to radiation.  Denies any fevers, chills, chest pain, abdominal pain nausea vomiting diarrhea.  She does not know of any known sick contacts.  Has had no unilateral calf swelling or tenderness.  Has no history of PE or DVT.  Patient is not on blood thinner.   Shortness of Breath      Home Medications Prior to Admission medications   Medication Sig Start Date End Date Taking? Authorizing Provider  amLODipine (NORVASC) 5 MG tablet Take 5 mg by mouth daily. 02/08/22   [provider]  aspirin EC 81 MG tablet Take 81 mg by mouth daily. Swallow whole.    [provider]  azelastine (ASTELIN) 0.1 % nasal spray Place 2 sprays into both nostrils 2 (two) times daily as needed for rhinitis. 02/22/23   Alfonse Spruce, MD  cetirizine (ZYRTEC ALLERGY) 10 MG tablet Take 1 tablet (10 mg total) by mouth daily. 02/22/23   Alfonse Spruce, MD  diclofenac Sodium (VOLTAREN) 1 % GEL Apply 1 application  topically 2 (two) times daily as needed (Pain).  02/08/22   [provider]  empagliflozin (JARDIANCE) 25 MG TABS tablet Take 25 mg by mouth daily. 02/08/22   [provider]  famotidine (PEPCID) 20 MG tablet Take 1 tablet (20 mg total) by mouth 2 (two) times daily. 02/22/23   Alfonse Spruce, MD  metFORMIN (GLUCOPHAGE-XR) 500 MG 24 hr tablet Take 1,000 mg by mouth 2 (two) times daily. 02/08/22   [provider]  methylPREDNISolone (MEDROL DOSEPAK) 4 MG TBPK tablet Use as instructed 01/25/24   Heilingoetter, Cassandra L, PA-C  nystatin (MYCOSTATIN/NYSTOP) powder Apply 1 Application topically 3 (three) times daily. 12/15/23   Heilingoetter, Cassandra L, PA-C  omeprazole (PRILOSEC) 20 MG capsule TAKE 1 CAPSULE(20 MG) BY MOUTH DAILY 02/10/24   Heilingoetter, Cassandra L, PA-C  pravastatin (PRAVACHOL) 80 MG tablet Take 80 mg by mouth daily.    [provider]  prochlorperazine (COMPAZINE) 10 MG tablet TAKE 1 TABLET(10 MG) BY MOUTH EVERY 6 HOURS AS NEEDED 11/11/23   Heilingoetter, Cassandra L, PA-C  sucralfate (CARAFATE) 1 g tablet TAKE 1 TABLET(1 GRAM) BY MOUTH FOUR TIMES DAILY(WITH MEALS AND AT BEDTIME) 01/10/24   Heilingoetter, Cassandra L, PA-C      Allergies    Benzonatate, Lisinopril, Aspirin, Azithromycin, and Codeine    Review of Systems   Review of Systems  Respiratory:  Positive for shortness of breath.     Physical Exam Updated Vital Signs BP (!) 159/76   Pulse Marland Kitchen)  104   Temp 98.2 F (36.8 C) (Oral)   Resp 20   Ht 5\' 4"  (1.626 m)   Wt 65.9 kg   SpO2 (!) 79%   BMI 24.94 kg/m  Physical Exam Vitals and nursing note reviewed.  Constitutional:      General: She is not in acute distress.    Appearance: She is not toxic-appearing.  HENT:     Head: Normocephalic and atraumatic.  Eyes:     General: No scleral icterus.    Conjunctiva/sclera: Conjunctivae normal.  Cardiovascular:     Rate and Rhythm: Normal rate and regular rhythm.     Pulses: Normal pulses.     Heart sounds: Normal heart  sounds.  Pulmonary:     Effort: Pulmonary effort is normal. No respiratory distress.     Breath sounds: Normal breath sounds. No stridor. No wheezing or rhonchi.     Comments: Placed on oxygen no acute distress. Abdominal:     General: Abdomen is flat. Bowel sounds are normal.     Palpations: Abdomen is soft.     Tenderness: There is no abdominal tenderness.  Skin:    General: Skin is warm and dry.     Findings: No lesion.  Neurological:     General: No focal deficit present.     Mental Status: She is alert and oriented to person, place, and time. Mental status is at baseline.     ED Results / Procedures / Treatments   Labs (all labs ordered are listed, but only abnormal results are displayed) Labs Reviewed  COMPREHENSIVE METABOLIC PANEL - Abnormal; Notable for the following components:      Result Value   Sodium 132 (*)    Potassium 3.2 (*)    CO2 17 (*)    Glucose, Bld 178 (*)    BUN 29 (*)    Calcium 8.6 (*)    Albumin 2.9 (*)    All other components within normal limits  CBC - Abnormal; Notable for the following components:   Hemoglobin 11.5 (*)    RDW 18.9 (*)    All other components within normal limits  RESP PANEL BY RT-PCR (RSV, FLU A&B, COVID)  [id number]  CULTURE, BLOOD (ROUTINE X 2)  CULTURE, BLOOD (ROUTINE X 2)  I-STAT VENOUS BLOOD GAS, ED  I-STAT CG4 LACTIC ACID, ED  I-STAT CG4 LACTIC ACID, ED    EKG None  Radiology No results found.  Procedures Procedures    Medications Ordered in ED Medications  ipratropium-albuterol (DUONEB) 0.5-2.5 (3) MG/3ML nebulizer solution 3 mL (has no administration in time range)  sodium chloride 0.9 % bolus 1,000 mL (has no administration in time range)    ED Course/ Medical Decision Making/ A&P Clinical Course as of 02/13/24 1519  Mon Feb 13, 2024  1420 Patient reassessed.  Patient reports improvement in shortness of breath after nebulizer treatment.  Continues to have normal work of breathing.  She is currently 95%  on 2 L nasal cannula. [JB]    Clinical Course User Index [JB] Lilo Wallington, Horald Chestnut, PA-C                                 Medical Decision Making Amount and/or Complexity of Data Reviewed Labs: ordered. Radiology: ordered.  Risk Prescription drug management.   [name] 73 y.o. presented today for shortness of breath.  Working DDx that I considered at this time includes, but  not limited to, asthma/COPD exacerbation, URI, viral illness, anemia, ACS, PE, pneumonia, pleural effusion, lung mass.  R/o DDx: These are considered less likely due to history of present illness, physical exam, labs/imaging findings  Pmhx: Medical history of lung cancer last chemo in January.   Review of prior external notes: Patient was seen earlier today by primary care and sent for hypoxia.  Unique Tests and My Interpretation:  CBC with no elevated white blood cell count, hemoglobin is 11.5.  CMP with potassium of 3.2, sodium 132 no elevation in BUN or creatinine, normal kidney function and normal AST, ALT.  Blood cultures pending, lactic and VBG pending  Problem List / ED Course / Critical interventions / Medication management  Reporting to emergency room 79% on room air, significantly tachycardic and short of breath.  Patient was placed on 2 L nasal cannula given albuterol inhaler with improvement of symptoms.  She does note some mild shortness of breath right now however greatly improved.  No sign of distress, normal respiratory effort.  Did order blood cultures and lactic as patient meets SIRS criteria with initial presentation and initial vitals.  Given chemotherapy and cancer I am concerned that she has PE causing symptoms.  I have ordered CT angio to rule this out.  She is not endorsing any chest pain or chest tightness.  Thus doubt ACS as cause.  Chest x-ray and CT pending. I ordered medication including DuoNeb for SOB.  Reevaluation of the patient after these medicines showed that the patient  improved I have reviewed the patients home medicines and have made adjustments as needed  Plan:  Patient does not normally require oxygen.  Currently requiring 2 L of oxygen.  Been significantly short of breath.  At the very least needs to be admitted for hypoxia. Patient signed off to oncoming ED provider pending imaging, lactic and VBG results.        Final Clinical Impression(s) / ED Diagnoses Final diagnoses:  Hypoxia    Rx / DC Orders ED Discharge Orders     None         Smitty Knudsen, PA-C 02/13/24 1524    Alvira Monday, MD 02/14/24 0001

## 2024-02-13 NOTE — ED Triage Notes (Signed)
 Sent by PCP for O2 in the 80s. Upon arrival, pt is unable to speak in sentences, sob on exertion. O2 is 79% on RA. Pt recovers after sitting for a few minutes, placed on 2.5L Klein and O2 is 97% on RA and pt is no longer appearing SOB.  Pt has lung cancer.

## 2024-02-13 NOTE — H&P (Addendum)
 History and Physical    Lindsey Williamson UJW:[id number] DOB: 20-Mar-1951 DOA: 02/13/2024  PCP: [name], MD   Chief Complaint:  cough, sob  HPI: Lindsey Williamson is a 73 y.o. female with medical history significant of non-small cell lung cancer, type 2 diabetes, hypertension who presents emergency department due to shortness of breath.  Patient states the last 4 days she has had progressively worsening shortness of breath.  She saw her primary care doctor who found her satting in the 71s and recommended she present to the ER.  On arrival she had conversational dyspnea and was in respiratory distress.  She was endorsing worsening cough.  Labs were obtained which showed respiratory viral panel negative, sodium 132, potassium 3.2, WBC 4.7, hemoglobin 11.5, lactic acid within normal limits.  Patient underwent chest x-ray which showed no acute abnormalities.  CT pulmonary embolism study showed right airspace opacity.  Patient was admitted for further workup.  On admission she was resting comfortably on 3 L.  She endorsed productive cough with dark brown sputum.  Her last radiation treatment was in December and she also finished her last chemotherapy at that time.   Review of Systems: Review of Systems  Constitutional: Negative.  Negative for chills and fever.  HENT: Negative.    Eyes: Negative.   Respiratory: Negative.    Cardiovascular: Negative.   Gastrointestinal: Negative.   Genitourinary: Negative.   Musculoskeletal: Negative.   Skin:  Negative for itching and rash.  Neurological: Negative.   Endo/Heme/Allergies: Negative.   Psychiatric/Behavioral: Negative.       As per HPI otherwise 10 point review of systems negative.   Allergies  Allergen Reactions   Benzonatate Palpitations   Lisinopril Swelling and Other (See Comments)    Patient ended up in the ED with a swollen mouth and tongue   Aspirin Palpitations and Nausea And Vomiting    Can tolerate baby aspirin without  difficulty   Azithromycin Palpitations   Codeine Palpitations    Past Medical History:  Diagnosis Date   Angioedema 10/17/2023   Arthritis    Diabetes mellitus without complication (HCC)    Hypertension     Past Surgical History:  Procedure Laterality Date   BRONCHIAL BIOPSY  03/30/2022   Procedure: BRONCHIAL BIOPSIES;  Surgeon: Josephine Igo, DO;  Location: [facility] ENDOSCOPY;  Service: Pulmonary;;   BRONCHIAL BIOPSY  10/17/2023   Procedure: BRONCHIAL BIOPSIES;  Surgeon: Leslye Peer, MD;  Location: [facility] ENDOSCOPY;  Service: Pulmonary;;   BRONCHIAL BRUSHINGS  03/30/2022   Procedure: BRONCHIAL BRUSHINGS;  Surgeon: Josephine Igo, DO;  Location: [facility] ENDOSCOPY;  Service: Pulmonary;;   BRONCHIAL BRUSHINGS  10/17/2023   Procedure: BRONCHIAL BRUSHINGS;  Surgeon: Leslye Peer, MD;  Location[facility] ENDOSCOPY;  Service: Pulmonary;;   BRONCHIAL NEEDLE ASPIRATION BIOPSY  03/30/2022   Procedure: BRONCHIAL NEEDLE ASPIRATION BIOPSIES;  Surgeon: Josephine Igo, DO;  Location: [facility] ENDOSCOPY;  Service: Pulmonary;;   BRONCHIAL NEEDLE ASPIRATION BIOPSY  10/17/2023   Procedure: BRONCHIAL NEEDLE ASPIRATION BIOPSIES;  Surgeon: Leslye Peer, MD;  Location: [facility] ENDOSCOPY;  Service: Pulmonary;;   EYE SURGERY     FIDUCIAL MARKER PLACEMENT  03/30/2022   Procedure: FIDUCIAL MARKER PLACEMENT;  Surgeon: Josephine Igo, DO;  Location: [facility] ENDOSCOPY;  Service: Pulmonary;;   FINE NEEDLE ASPIRATION  10/17/2023   Procedure: FINE NEEDLE ASPIRATION;  Surgeon: Leslye Peer, MD;  Location: [facility] ENDOSCOPY;  Service: Pulmonary;;   FOREIGN BODY REMOVAL  03/30/2022   Procedure: FOREIGN  BODY REMOVAL;  Surgeon: Josephine Igo, DO;  Location: [facility] ENDOSCOPY;  Service: Pulmonary;;   HEMOSTASIS CONTROL  10/17/2023   Procedure: HEMOSTASIS CONTROL;  Surgeon: Leslye Peer, MD;  Location: [facility] ENDOSCOPY;  Service: Pulmonary;;   VIDEO BRONCHOSCOPY WITH ENDOBRONCHIAL ULTRASOUND Bilateral 03/30/2022   Procedure: VIDEO BRONCHOSCOPY WITH  ENDOBRONCHIAL ULTRASOUND;  Surgeon: Josephine Igo, DO;  Location: [facility] ENDOSCOPY;  Service: Pulmonary;  Laterality: Bilateral;   VIDEO BRONCHOSCOPY WITH ENDOBRONCHIAL ULTRASOUND Right 10/17/2023   Procedure: VIDEO BRONCHOSCOPY WITH ENDOBRONCHIAL ULTRASOUND;  Surgeon: Leslye Peer, MD;  Location: [facility] ENDOSCOPY;  Service: Pulmonary;  Laterality: Right;   VIDEO BRONCHOSCOPY WITH RADIAL ENDOBRONCHIAL ULTRASOUND  03/30/2022   Procedure: VIDEO BRONCHOSCOPY WITH RADIAL ENDOBRONCHIAL ULTRASOUND;  Surgeon: Josephine Igo, DO;  Location: [facility] ENDOSCOPY;  Service: Pulmonary;;     reports that she quit smoking about 22 months ago. Her smoking use included cigarettes. She has never been exposed to tobacco smoke. She has quit using smokeless tobacco. She reports that she does not drink alcohol and does not use drugs.  Family History  Problem Relation Age of Onset   Allergic rhinitis Sister    Asthma Sister    Angioedema Neg Hx    Atopy Neg Hx    Immunodeficiency Neg Hx    Urticaria Neg Hx    Eczema Neg Hx     Prior to Admission medications   Medication Sig Start Date End Date Taking? Authorizing Provider  amLODipine (NORVASC) 5 MG tablet Take 2.5 mg by mouth in the morning. 02/08/22  Yes [provider]  aspirin EC 81 MG tablet Take 81 mg by mouth daily. Swallow whole.   Yes [provider]  azelastine (ASTELIN) 0.1 % nasal spray Place 2 sprays into both nostrils 2 (two) times daily as needed for rhinitis. 02/22/23  Yes Alfonse Spruce, MD  bisoprolol (ZEBETA) 5 MG tablet Take 2.5 mg by mouth daily.   Yes [provider]  cetirizine (ZYRTEC ALLERGY) 10 MG tablet Take 1 tablet (10 mg total) by mouth daily. 02/22/23  Yes Alfonse Spruce, MD  diclofenac Sodium (VOLTAREN) 1 % GEL Apply 2 g topically 2 (two) times daily as needed (for pain). 02/08/22  Yes [provider]  empagliflozin (JARDIANCE) 25 MG TABS tablet Take 25 mg by mouth daily at 6 PM. 02/08/22  Yes  [provider]  famotidine (PEPCID) 20 MG tablet Take 1 tablet (20 mg total) by mouth 2 (two) times daily. Patient taking differently: Take 20 mg by mouth 2 (two) times daily as needed for heartburn or indigestion. 02/22/23  Yes Alfonse Spruce, MD  ferrous sulfate 325 (65 FE) MG tablet Take 325 mg by mouth daily with breakfast.   Yes [provider]  glipiZIDE (GLUCOTROL XL) 2.5 MG 24 hr tablet Take 2.5 mg by mouth daily with breakfast.   Yes [provider]  metFORMIN (GLUCOPHAGE-XR) 500 MG 24 hr tablet Take 1,000 mg by mouth in the morning and at bedtime. 02/08/22  Yes [provider]  nystatin (MYCOSTATIN/NYSTOP) powder Apply 1 Application topically 3 (three) times daily. Patient taking differently: Apply 1 Application topically 3 (three) times daily as needed (for irritation- affectefd areas). 12/15/23  Yes Heilingoetter, Cassandra L, PA-C  omeprazole (PRILOSEC) 20 MG capsule TAKE 1 CAPSULE(20 MG) BY MOUTH DAILY Patient taking differently: Take 20 mg by mouth daily before breakfast. 02/10/24  Yes Heilingoetter, Cassandra L, PA-C  pravastatin (PRAVACHOL) 80 MG tablet Take 80 mg by mouth at  bedtime.   Yes [provider]  prochlorperazine (COMPAZINE) 10 MG tablet TAKE 1 TABLET(10 MG) BY MOUTH EVERY 6 HOURS AS NEEDED Patient taking differently: Take 10 mg by mouth every 6 (six) hours as needed for nausea or vomiting. 11/11/23  Yes Heilingoetter, Cassandra L, PA-C  methylPREDNISolone (MEDROL DOSEPAK) 4 MG TBPK tablet Use as instructed Patient not taking: Reported on 02/13/2024 01/25/24   Heilingoetter, Cassandra L, PA-C  sucralfate (CARAFATE) 1 g tablet TAKE 1 TABLET(1 GRAM) BY MOUTH FOUR TIMES DAILY(WITH MEALS AND AT BEDTIME) Patient not taking: Reported on 02/13/2024 01/10/24   Heilingoetter, Cassandra L, PA-C    Physical Exam: Vitals:   02/13/24 2000 02/13/24 2058 02/13/24 2103 02/13/24 2131  BP: (!) 164/78 (!) 162/86    Pulse: (!) 117      Resp:      Temp:  99.2 F (37.3 C)    TempSrc:  Oral    SpO2: 95% 95%  92%  Weight:   66.3 kg   Height:   5\' 4"  (1.626 m)    Physical Exam Constitutional:      Appearance: She is normal weight.  HENT:     Head: Normocephalic.     Mouth/Throat:     Mouth: Mucous membranes are moist.  Eyes:     Pupils: Pupils are equal, round, and reactive to light.  Cardiovascular:     Rate and Rhythm: Normal rate and regular rhythm.  Pulmonary:     Effort: Pulmonary effort is normal. No tachypnea.     Breath sounds: Normal breath sounds. No wheezing.  Abdominal:     Palpations: Abdomen is soft.  Musculoskeletal:        General: Normal range of motion.     Cervical back: Normal range of motion.  Skin:    General: Skin is warm.     Capillary Refill: Capillary refill takes less than 2 seconds.  Neurological:     General: No focal deficit present.     Mental Status: She is alert.  Psychiatric:        Mood and Affect: Mood normal.        Labs on Admission: I have personally reviewed the patients's labs and imaging studies.  Assessment/Plan Principal Problem:   Hypoxia   # Acute hypoxic respiratory failure most likely secondary to bacterial community acquired pneumonia, POA, active - Patient has a history of radiation so could have a component of radiation pneumonitis - Opacities on chest imaging  Plan: Continue ceftriaxone and doxycycline as patient has azithromycin allergy  Continue Hycodan for cough Scheduled DuoNebs, Pulmicort Brovana as patient has decades pack-year smoking history however no prior COPD diagnosis  # Hypertension-continue amlodipine, bisoprolol  # Hyperlipidemia-continue pravastatin  # GERD-continue Protonix, Pepcid  # CAD status post CABG-continue aspirin  # Hyponatremia-status post IV fluids  # Hypokalemia-replete with potassium  # Non-small cell lung cancer-continue to monitor and follow-up with oncology outpatient  Admission status: Inpatient  Telemetry  Certification: The appropriate patient status for this patient is INPATIENT. Inpatient status is judged to be reasonable and necessary in order to provide the required intensity of service to ensure the patient's safety. The patient's presenting symptoms, physical exam findings, and initial radiographic and laboratory data in the context of their chronic comorbidities is felt to place them at high risk for further clinical deterioration. Furthermore, it is not anticipated that the patient will be medically stable for discharge from the hospital within 2 midnights of admission.   * I certify  that at the point of admission it is my clinical judgment that the patient will require inpatient hospital care spanning beyond 2 midnights from the point of admission due to high intensity of service, high risk for further deterioration and high frequency of surveillance required.Alan Mulder MD [facility] If 7PM-7AM, please contact night-coverage [email or web address]  02/13/2024, 10:46 PM

## 2024-02-13 NOTE — ED Provider Triage Note (Signed)
 Emergency Medicine Provider Triage Evaluation Note  Lindsey Williamson , a 73 y.o. female  was evaluated in triage.  Pt complains of short of breath, cough, hypoxia.  Was seen at primary care today and found to have oxygen in 80% on room air.  Was sent here for further evaluation.  Patient has history of lung cancer and is receiving chemo and radiation.  Patient reports she is short of breath at rest and is worse with exertion.  Now that she is placed on 2 L nasal cannula she has had improvements of respiratory effort as well as shortness of breath.  Review of Systems  Positive: SOB Negative: CP  Physical Exam  BP (!) 159/76   Pulse (!) 104   Temp 98.2 F (36.8 C) (Oral)   Resp 20   Ht 5\' 4"  (1.626 m)   Wt 65.9 kg   SpO2 (!) 79%   BMI 24.94 kg/m  Gen:   Awake, no distress, and well appearing, no acute distress Resp:  Normal effort  MSK:   Moves extremities without difficulty  Other:    Medical Decision Making  Medically screening exam initiated at 12:24 PM.  Appropriate orders placed.  Lindsey Williamson was informed that the remainder of the evaluation will be completed by another provider, this initial triage assessment does not replace that evaluation, and the importance of remaining in the ED until their evaluation is complete.     Smitty Knudsen, PA-C 02/13/24 1225

## 2024-02-13 NOTE — ED Provider Notes (Signed)
 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 Kathrynn Running.   Reports new productive cough and SOB. Hypoxic upon arrival 80% room air.  Follow up on chest x-ray and PE study. Will plan to admit Physical Exam  BP (!) 144/95   Pulse (!) 107   Temp 97.6 F (36.4 C) (Oral)   Resp (!) 22   Ht 5\' 4"  (1.626 m)   Wt 65.9 kg   SpO2 93%   BMI 24.94 kg/m   Physical Exam  Procedures  Procedures  ED Course / MDM   Clinical Course as of 02/13/24 1910  Mon Feb 13, 2024  1420 Patient reassessed.  Patient reports improvement in shortness of breath after nebulizer treatment.  Continues to have normal work of breathing.  She is currently 95% on 2 L nasal cannula. [JB]    Clinical Course User Index [JB] Barrett, Horald Chestnut, PA-C   Medical Decision Making Amount and/or Complexity of Data Reviewed Labs: ordered. Radiology: ordered.  Risk Prescription drug management. Decision regarding hospitalization.    Differential diagnosis includes but is not limited to COVID, flu, RSV, viral URI, strep pharyngitis, viral pharyngitis, allergic rhinitis, pneumonia, bronchitis, CHF, spread of malignancy, pulmonary embolism  ED Course:  Patient stable appearing, but oxygen in the 80s on room air, requiring 2 L nasal cannula to bring her up to 94%.  She normally does not use oxygen at home.  Flu, COVID, RSV negative.  Chest x-ray without signs of pneumonia.  She denies any other sick symptoms besides cough, but could consider viral URI as causing some of her symptoms. Patient with hypokalemia at 3.2, given oral potassium repletion with 40 mEq potassium Also given NS bolus for hyponatremia CTA angio chest without signs of PE.  She does have new groundglass opacities throughout the lungs, concerning for edema or infection.  Impression: Shortness of breath and hypoxia  Disposition:  Admission with Dr. Avie Arenas  Imaging Studies: Chest x-ray  unremarkable  CTA chest read is as below: IMPRESSION:  1. No evidence of pulmonary embolus.  2. Three-vessel coronary artery disease.  Borderline cardiomegaly.  3. Mediastinal and right hilar adenopathy unchanged. Number for  airspace opacity at the right lung base in the right lower lobe  unchanged since recent study.  4. Increasing ground-glass opacities throughout the lungs, right  greater than left. This could reflect edema or infection.    Aortic Atherosclerosis (ICD10-I70.0) and Emphysema (ICD10-J43.9).     Cardiac Monitoring: / EKG: The patient was maintained on a cardiac monitor.  I personally viewed and interpreted the cardiac monitored which showed an underlying rhythm of: Sinus tachycardia   Consultations Obtained: I requested consultation with the hospitalist Dr. Avie Arenas,  and discussed lab and imaging findings as well as pertinent plan - they recommend: Admission for further management of hypoxia               Arabella Merles, PA-C 02/13/24 1911    Loetta Rough, MD 02/13/24 2026

## 2024-02-14 DIAGNOSIS — R0902 Hypoxemia: Secondary | ICD-10-CM | POA: Diagnosis not present

## 2024-02-14 DIAGNOSIS — J189 Pneumonia, unspecified organism: Secondary | ICD-10-CM | POA: Diagnosis not present

## 2024-02-14 LAB — COMPREHENSIVE METABOLIC PANEL
ALT: 6 U/L (ref 0–44)
AST: 17 U/L (ref 15–41)
Albumin: 2.6 g/dL — ABNORMAL LOW (ref 3.5–5.0)
Alkaline Phosphatase: 65 U/L (ref 38–126)
Anion gap: 13 (ref 5–15)
BUN: 19 mg/dL (ref 8–23)
CO2: 18 mmol/L — ABNORMAL LOW (ref 22–32)
Calcium: 8.5 mg/dL — ABNORMAL LOW (ref 8.9–10.3)
Chloride: 106 mmol/L (ref 98–111)
Creatinine, Ser: 0.72 mg/dL (ref 0.44–1.00)
GFR, Estimated: >60 mL/min (ref >60)
Glucose, Bld: 178 mg/dL — ABNORMAL HIGH (ref 70–99)
Potassium: 4.3 mmol/L (ref 3.5–5.1)
Sodium: 137 mmol/L (ref 135–145)
Total Bilirubin: 0.4 mg/dL (ref 0.0–1.2)
Total Protein: 7.2 g/dL (ref 6.5–8.1)

## 2024-02-14 LAB — GLUCOSE, CAPILLARY
Glucose-Capillary: 241 mg/dL — ABNORMAL HIGH (ref 70–99)
Glucose-Capillary: 209 mg/dL — ABNORMAL HIGH (ref 70–99)
Glucose-Capillary: 208 mg/dL — ABNORMAL HIGH (ref 70–99)

## 2024-02-14 LAB — PROCALCITONIN: Procalcitonin: 0.67 ng/mL

## 2024-02-14 MED ORDER — FUROSEMIDE 10 MG/ML IJ SOLN
20.0000 mg | Freq: Once | INTRAMUSCULAR | Status: AC
  Administered 2024-02-14: 20 mg via INTRAVENOUS
  Filled 2024-02-14: qty 2

## 2024-02-14 MED ORDER — LEVALBUTEROL HCL 0.63 MG/3ML IN NEBU
0.6300 mg | INHALATION_SOLUTION | Freq: Four times a day (QID) | RESPIRATORY_TRACT | Status: DC
  Administered 2024-02-14: 0.63 mg via RESPIRATORY_TRACT
  Filled 2024-02-14: qty 3

## 2024-02-14 MED ORDER — IPRATROPIUM BROMIDE 0.02 % IN SOLN
0.5000 mg | Freq: Four times a day (QID) | RESPIRATORY_TRACT | Status: DC
  Administered 2024-02-14: 0.5 mg via RESPIRATORY_TRACT
  Filled 2024-02-14: qty 2.5

## 2024-02-14 MED ORDER — LEVALBUTEROL HCL 0.63 MG/3ML IN NEBU
0.6300 mg | INHALATION_SOLUTION | Freq: Three times a day (TID) | RESPIRATORY_TRACT | Status: DC
  Administered 2024-02-15 – 2024-02-17 (×7): 0.63 mg via RESPIRATORY_TRACT
  Filled 2024-02-14 (×7): qty 3

## 2024-02-14 MED ORDER — PANTOPRAZOLE SODIUM 40 MG PO TBEC
40.0000 mg | DELAYED_RELEASE_TABLET | Freq: Every day | ORAL | Status: DC
  Administered 2024-02-14 – 2024-02-18 (×5): 40 mg via ORAL
  Filled 2024-02-14 (×5): qty 1

## 2024-02-14 MED ORDER — CLONAZEPAM 0.5 MG PO TABS
0.5000 mg | ORAL_TABLET | Freq: Once | ORAL | Status: AC
  Administered 2024-02-14: 0.5 mg via ORAL
  Filled 2024-02-14: qty 1

## 2024-02-14 MED ORDER — IPRATROPIUM BROMIDE 0.02 % IN SOLN
0.5000 mg | Freq: Three times a day (TID) | RESPIRATORY_TRACT | Status: DC
  Administered 2024-02-15 – 2024-02-17 (×7): 0.5 mg via RESPIRATORY_TRACT
  Filled 2024-02-14 (×7): qty 2.5

## 2024-02-14 NOTE — Progress Notes (Signed)
 Paged provider: [phone] still tachy 130s and resp 30s her K+ was 4.3 today do you want to check her Mg+ or EKG? [phone]  Awaiting response

## 2024-02-14 NOTE — Plan of Care (Signed)

## 2024-02-14 NOTE — Progress Notes (Signed)
 Patient noted to be tachy throughout the shift, given prn provided by MD order, removed clothing and pulse has now decreased to 120s, will pass on to oncoming that EKG needs to be done and follow up with on call for orders

## 2024-02-14 NOTE — Progress Notes (Signed)
 [facility]  PROGRESS NOTE  ALEXANDRYA CHIM BJY:[id number] DOB: [birth year] DOA: 02/13/2024 PCP: [name], MD   Brief HPI:     73 y.o. female with medical history significant of non-small cell lung cancer, type 2 diabetes, hypertension who presents emergency department due to shortness of breath.  CT pulmonary embolism study showed right airspace opacity. Patient was admitted for further workup.    Assessment/Plan:   Acute hypoxemic respiratory failure -Patient oxygen requirement has gone down to 2 L/min -Likely due to community-acquired pneumonia, seen on CT chest -Patient also has history of radiation;?  Radiation-induced lung injury -Continue Brovana, Pulmicort, DuoNeb nebulizer, doxycycline, ceftriaxone  Community-acquired pneumonia -Continue doxycycline, ceftriaxone -Check procalcitonin -Follow blood culture results  Hypertension -Continue amlodipine, bisoprolol  GERD -Continue Protonix  Hyperlipidemia -Continue pravastatin  CAD status post CABG -Continue aspirin  Hyponatremia -Resolved  Hypokalemia -Resolved   Non-small cell lung cancer -Follow-up oncology as outpatient       Medications     amLODipine  2.5 mg Oral Daily   arformoterol  15 mcg Nebulization BID   aspirin EC  81 mg Oral Daily   bisoprolol  2.5 mg Oral Daily   budesonide (PULMICORT) nebulizer solution  0.25 mg Nebulization BID   doxycycline  100 mg Oral Q12H   enoxaparin (LOVENOX) injection  40 mg Subcutaneous Q24H   ipratropium-albuterol  3 mL Nebulization Q6H   pantoprazole  40 mg Oral Daily   pravastatin  80 mg Oral QHS     Data Reviewed:   CBG:  Recent Labs  Lab 02/14/24 1257  GLUCAP 241*    SpO2: 93 % O2 Flow Rate (L/min): 2 L/min FiO2 (%): 28 %    Vitals:   02/14/24 1100 02/14/24 1200 02/14/24 1250 02/14/24 1300  BP:   135/71   Pulse:   (!) 107   Resp: (!) 25 (!) 31 (!) 29 (!) 34  Temp:   (!) 97.4 F (36.3 C)   TempSrc:      SpO2:   93%    Weight:      Height:          Data Reviewed:  Basic Metabolic Panel: Recent Labs  Lab 02/09/24 0921 02/13/24 1309 02/14/24 0932  NA 135 132* 137  K 3.8 3.2* 4.3  CL 103 101 106  CO2 23 17* 18*  GLUCOSE 188* 178* 178*  BUN 16 29* 19  CREATININE 0.64 0.61 0.72  CALCIUM 9.4 8.6* 8.5*    CBC: Recent Labs  Lab 02/09/24 0921 02/13/24 1309  WBC 4.8 4.7  NEUTROABS 3.1  --   HGB 11.4* 11.5*  HCT 35.9* 36.9  MCV 85.3 87.4  PLT 252 277    LFT Recent Labs  Lab 02/09/24 0921 02/13/24 1309 02/14/24 0932  AST 15 22 17   ALT 5 7 6   ALKPHOS 63 72 65  BILITOT 0.4 0.6 0.4  PROT 7.7 7.9 7.2  ALBUMIN 3.6 2.9* 2.6*     Antibiotics: Anti-infectives (From admission, onward)    Start     Dose/Rate Route Frequency Ordered Stop   02/13/24 2345  cefTRIAXone (ROCEPHIN) 2 g in sodium chloride 0.9 % 100 mL IVPB        2 g 200 mL/hr over 30 Minutes Intravenous Every 24 hours 02/13/24 2245     02/13/24 2345  doxycycline (VIBRA-TABS) tablet 100 mg        100 mg Oral Every 12 hours 02/13/24 2245  DVT prophylaxis: Lovenox  Code Status: Full code  Family Communication:    CONSULTS    Subjective   Shortness of breath has improved.   Objective    Physical Examination:   General: Appears in no acute distress Cardiovascular: S1-S2, regular Respiratory: Decreased breath sounds bilaterally Abdomen: Soft, nontender, no organomegaly Extremities: No edema in the lower extremities Neurologic: Alert, oriented x 3, no focal deficit noted   Status is: Inpatient:             Kostantinos Tallman S Maziah Keeling   [facility] If 7PM-7AM, please contact night-coverage at [email or web address], Office  [phone]   02/14/2024, 1:25 PM  LOS: 1 day

## 2024-02-14 NOTE — Plan of Care (Signed)

## 2024-02-15 ENCOUNTER — Inpatient Hospital Stay (HOSPITAL_COMMUNITY): Payer: Medicare Other

## 2024-02-15 DIAGNOSIS — R0602 Shortness of breath: Secondary | ICD-10-CM

## 2024-02-15 DIAGNOSIS — R0902 Hypoxemia: Secondary | ICD-10-CM | POA: Diagnosis not present

## 2024-02-15 LAB — GLUCOSE, CAPILLARY
Glucose-Capillary: 151 mg/dL — ABNORMAL HIGH (ref 70–99)
Glucose-Capillary: 243 mg/dL — ABNORMAL HIGH (ref 70–99)
Glucose-Capillary: 333 mg/dL — ABNORMAL HIGH (ref 70–99)
Glucose-Capillary: 592 mg/dL (ref 70–99)

## 2024-02-15 LAB — ECHOCARDIOGRAM COMPLETE
Height: 64 in
S' Lateral: 2.35 cm
Weight: 2338.64 [oz_av]

## 2024-02-15 MED ORDER — METHYLPREDNISOLONE SODIUM SUCC 40 MG IJ SOLR: 40.0000 mg | Freq: Two times a day (BID) | INTRAMUSCULAR | Status: DC

## 2024-02-15 MED ORDER — LEVALBUTEROL HCL 0.63 MG/3ML IN NEBU
0.6300 mg | INHALATION_SOLUTION | Freq: Once | RESPIRATORY_TRACT | Status: AC | PRN
  Administered 2024-02-15: 0.63 mg via RESPIRATORY_TRACT
  Filled 2024-02-15: qty 3

## 2024-02-15 MED ORDER — POLYETHYLENE GLYCOL 3350 17 G PO PACK
17.0000 g | PACK | Freq: Every day | ORAL | Status: DC
  Administered 2024-02-15 – 2024-02-18 (×4): 17 g via ORAL
  Filled 2024-02-15 (×3): qty 1

## 2024-02-15 MED ORDER — INSULIN ASPART 100 UNIT/ML IJ SOLN
10.0000 [IU] | Freq: Once | INTRAMUSCULAR | Status: AC
  Administered 2024-02-15: 10 [IU] via SUBCUTANEOUS

## 2024-02-15 MED ORDER — SENNOSIDES-DOCUSATE SODIUM 8.6-50 MG PO TABS
1.0000 | ORAL_TABLET | Freq: Two times a day (BID) | ORAL | Status: DC
  Administered 2024-02-15 – 2024-02-18 (×6): 1 via ORAL
  Filled 2024-02-15 (×6): qty 1

## 2024-02-15 MED ORDER — METHYLPREDNISOLONE SODIUM SUCC 40 MG IJ SOLR
40.0000 mg | Freq: Two times a day (BID) | INTRAMUSCULAR | Status: DC
  Administered 2024-02-15 – 2024-02-16 (×4): 40 mg via INTRAVENOUS
  Filled 2024-02-15 (×4): qty 1

## 2024-02-15 MED ORDER — METHYLPREDNISOLONE SODIUM SUCC 125 MG IJ SOLR: 60.0000 mg | Freq: Two times a day (BID) | INTRAMUSCULAR | Status: DC

## 2024-02-15 MED ORDER — INSULIN ASPART 100 UNIT/ML IJ SOLN
0.0000 [IU] | Freq: Every day | INTRAMUSCULAR | Status: DC
  Administered 2024-02-16: 4 [IU] via SUBCUTANEOUS

## 2024-02-15 MED ORDER — INSULIN ASPART 100 UNIT/ML IJ SOLN
0.0000 [IU] | Freq: Three times a day (TID) | INTRAMUSCULAR | Status: DC
  Administered 2024-02-16: 11 [IU] via SUBCUTANEOUS
  Administered 2024-02-16: 5 [IU] via SUBCUTANEOUS
  Administered 2024-02-16: 11 [IU] via SUBCUTANEOUS
  Administered 2024-02-17: 8 [IU] via SUBCUTANEOUS
  Administered 2024-02-17: 11 [IU] via SUBCUTANEOUS
  Administered 2024-02-18 (×2): 3 [IU] via SUBCUTANEOUS

## 2024-02-15 MED ORDER — LORATADINE 10 MG PO TABS
10.0000 mg | ORAL_TABLET | Freq: Every day | ORAL | Status: DC | PRN
  Administered 2024-02-15: 10 mg via ORAL
  Filled 2024-02-15: qty 1

## 2024-02-15 MED ORDER — FUROSEMIDE 10 MG/ML IJ SOLN: 40.0000 mg | Freq: Once | INTRAMUSCULAR | Status: DC

## 2024-02-15 MED ORDER — FUROSEMIDE 10 MG/ML IJ SOLN
40.0000 mg | Freq: Two times a day (BID) | INTRAMUSCULAR | Status: AC
  Administered 2024-02-15 (×2): 40 mg via INTRAVENOUS
  Filled 2024-02-15 (×2): qty 4

## 2024-02-15 MED ORDER — GUAIFENESIN ER 600 MG PO TB12
600.0000 mg | ORAL_TABLET | Freq: Two times a day (BID) | ORAL | Status: DC
  Administered 2024-02-15 – 2024-02-18 (×7): 600 mg via ORAL
  Filled 2024-02-15 (×7): qty 1

## 2024-02-15 MED ORDER — BISOPROLOL FUMARATE 5 MG PO TABS
2.5000 mg | ORAL_TABLET | Freq: Every day | ORAL | Status: DC
  Administered 2024-02-15 – 2024-02-18 (×4): 2.5 mg via ORAL
  Filled 2024-02-15 (×4): qty 1

## 2024-02-15 MED ORDER — ALPRAZOLAM 0.5 MG PO TABS: 0.5000 mg | ORAL_TABLET | Freq: Three times a day (TID) | ORAL | Status: DC | PRN

## 2024-02-15 NOTE — Progress Notes (Signed)
 Patients hr in the 130s/140s at rest. Patient attributes tachycardia to coughing. This RN administered Hycodan per orders at 0253, coughing relieved but patient still tachycardic. Patient also c/o nasal congestion and now requiring 6L nasal cannula to maintain an o2 sat of 91%. Provider notified, see new orders.

## 2024-02-15 NOTE — Progress Notes (Signed)
 Patient's blood sugar 209, patient has been running in the 200s all day but is receiving no coverage. Patient is requesting home meds( metformin and jardiance ), provider notified. No new orders.

## 2024-02-15 NOTE — Progress Notes (Signed)
 PROGRESS NOTE  Lindsey Williamson  WUJ:[id number] DOB: 06-29-1951 DOA: 02/13/2024 PCP: [name], MD   Brief Narrative: Patient is a 73 year old female with history of small cell lung cancer, who presented to the emergency department with complaints shortness of breath .  CT imaging on presentation showed right airspace opacity.  Started on broad spectrum antibiotics.  Hospital course remarkable for oxygen requirement of oxygen.  Started on steroids for possible immunotherapy mediated  pneumonitis.  Goal is to wean the oxygen before discharge  Assessment & Plan:  Principal Problem:   Hypoxia  Acute hypoxic respiratory failure: Currently requiring 5 to 6 L of oxygen per minute.  Checking follow-up chest x-ray today.  Continue bronchodilators, nebulizing treatment, antibiotics.  She had immunotherapy about a month ago.  Immunotherapy mediated pneumonitis is a possibility. Radiation-induced lung injury also possible. Will continue to wean oxygen.  Patient not on oxygen at home. 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  CT angiogram of chest did not show any PE but showed  right hilar adenopathy, airspace opacity at the right lung base in the right lower lobe,Increasing ground-glass opacities throughout the lungs, right greater than left.  Has crackles bilaterally.  Will give her a dose of Lasix 40 mg IV once.  Will get echocardiogram.  Community-acquired pneumonia: Current afebrile, no leukocytosis.  Procalcitonin reassuring.  Continue current antibiotics.  Cultures have not shown any growth.  Hypertension: Currently on amlodipine, bisoprolol.  Monitor blood pressure  GERD: Continue Protonix  Hyperlipidemia: Continue statin  Coronary artery disease: Status post CABG.  Continue aspirin.  No anginal symptoms  Hypokalemia: Supplemented and corrected  History of non-small cell lung cancer: Follows with oncology as an outpatient.  On chemotherapy, immunotherapy.  Constipation:  Continue bowel regimen  Anxiety: Started on Xanax        DVT prophylaxis:enoxaparin (LOVENOX) injection 40 mg Start: 02/13/24 2200 SCDs Start: 02/13/24 1940     Code Status: Full Code  Family Communication: None at bedside  Patient status:Inpatient  Patient is from :home  Anticipated discharge NF:AOZH  Estimated DC date:2-3 days   Consultants: None  Procedures:None  Antimicrobials:  Anti-infectives (From admission, onward)    Start     Dose/Rate Route Frequency Ordered Stop   02/13/24 2345  cefTRIAXone (ROCEPHIN) 2 g in sodium chloride 0.9 % 100 mL IVPB        2 g 200 mL/hr over 30 Minutes Intravenous Every 24 hours 02/13/24 2245     02/13/24 2345  doxycycline (VIBRA-TABS) tablet 100 mg        100 mg Oral Every 12 hours 02/13/24 2245         Subjective: Patient seen and examined at the bedside today.  During my evaluation, she was on 5 L of oxygen.  She complains of dyspnea but was speaking in full sentences.  Having productive cough.  Objective: Vitals:   02/14/24 2139 02/14/24 2349 02/15/24 0401 02/15/24 0419  BP: 131/70 133/78 135/82   Pulse: (!) 111 (!) 116 (!) 131   Resp: 20 18 20    Temp: 97.7 F (36.5 C) 98.5 F (36.9 C) 98.1 F (36.7 C)   TempSrc: Oral Oral Oral   SpO2: 96% 91% 91% 91%  Weight:      Height:        Intake/Output Summary (Last 24 hours) at 02/15/2024 0730 Last data filed at 02/15/2024 0700 Gross per 24 hour  Intake 360 ml  Output 1650 ml  Net -1290 ml   Filed  Weights   02/13/24 1212 02/13/24 2103  Weight: 65.9 kg 66.3 kg    Examination:  General exam: Overall comfortable, not in distress, pleasant female HEENT: PERRL Respiratory system: Crackles bilaterally in bases Cardiovascular system: Sinus tachycardia  Gastrointestinal system: Abdomen is nondistended, soft and nontender. Central nervous system: Alert and oriented Extremities: No edema, no clubbing ,no cyanosis Skin: No rashes, no ulcers,no icterus     Data  Reviewed: I have personally reviewed following labs and imaging studies  CBC: Recent Labs  Lab 02/09/24 0921 02/13/24 1309  WBC 4.8 4.7  NEUTROABS 3.1  --   HGB 11.4* 11.5*  HCT 35.9* 36.9  MCV 85.3 87.4  PLT 252 277   Basic Metabolic Panel: Recent Labs  Lab 02/09/24 0921 02/13/24 1309 02/14/24 0932  NA 135 132* 137  K 3.8 3.2* 4.3  CL 103 101 106  CO2 23 17* 18*  GLUCOSE 188* 178* 178*  BUN 16 29* 19  CREATININE 0.64 0.61 0.72  CALCIUM 9.4 8.6* 8.5*     Recent Results (from the past 240 hours)  Resp panel by RT-PCR (RSV, Flu A&B, Covid) Anterior Nasal Swab     Status: None   Collection Time: 02/13/24  1:09 PM   Specimen: Anterior Nasal Swab  Result Value Ref Range Status   SARS Coronavirus 2 by RT PCR NEGATIVE NEGATIVE Final    Comment: (NOTE) SARS-CoV-2 target nucleic acids are NOT DETECTED.  The SARS-CoV-2 RNA is generally detectable in upper respiratory specimens during the acute phase of infection. The lowest concentration of SARS-CoV-2 viral copies this assay can detect is 138 copies/mL. A negative result does not preclude SARS-Cov-2 infection and should not be used as the sole basis for treatment or other patient management decisions. A negative result may occur with  improper specimen collection/handling, submission of specimen other than nasopharyngeal swab, presence of viral mutation(s) within the areas targeted by this assay, and inadequate number of viral copies(<138 copies/mL). A negative result must be combined with clinical observations, patient history, and epidemiological information. The expected result is Negative.  Fact Sheet for Patients:  BloggerCourse.com  Fact Sheet for Healthcare Providers:  SeriousBroker.it  This test is no t yet approved or cleared by the [place] FDA and  has been authorized for detection and/or diagnosis of SARS-CoV-2 by FDA under an Emergency Use  Authorization (EUA). This EUA will remain  in effect (meaning this test can be used) for the duration of the COVID-19 declaration under Section 564(b)(1) of the Act, 21 U.S.C.section 360bbb-3(b)(1), unless the authorization is terminated  or revoked sooner.       Influenza A by PCR NEGATIVE NEGATIVE Final   Influenza B by PCR NEGATIVE NEGATIVE Final    Comment: (NOTE) The Xpert Xpress SARS-CoV-2/FLU/RSV plus assay is intended as an aid in the diagnosis of influenza from Nasopharyngeal swab specimens and should not be used as a sole basis for treatment. Nasal washings and aspirates are unacceptable for Xpert Xpress SARS-CoV-2/FLU/RSV testing.  Fact Sheet for Patients: BloggerCourse.com  Fact Sheet for Healthcare Providers: SeriousBroker.it  This test is not yet approved or cleared by the [place] FDA and has been authorized for detection and/or diagnosis of SARS-CoV-2 by FDA under an Emergency Use Authorization (EUA). This EUA will remain in effect (meaning this test can be used) for the duration of the COVID-19 declaration under Section 564(b)(1) of the Act, 21 U.S.C. section 360bbb-3(b)(1), unless the authorization is terminated or revoked.     Resp Syncytial Virus  by PCR NEGATIVE NEGATIVE Final    Comment: (NOTE) Fact Sheet for Patients: BloggerCourse.com  Fact Sheet for Healthcare Providers: SeriousBroker.it  This test is not yet approved or cleared by the [place] FDA and has been authorized for detection and/or diagnosis of SARS-CoV-2 by FDA under an Emergency Use Authorization (EUA). This EUA will remain in effect (meaning this test can be used) for the duration of the COVID-19 declaration under Section 564(b)(1) of the Act, 21 U.S.C. section 360bbb-3(b)(1), unless the authorization is terminated or revoked.  Performed at [facility],  [address] [place], [place] [address]   Blood culture (routine x 2)     Status: None (Preliminary result)   Collection Time: 02/13/24  2:00 PM   Specimen: BLOOD  Result Value Ref Range Status   Specimen Description   Final    BLOOD BLOOD RIGHT ARM Performed at [facility], [address] [place], [place] [address]    Special Requests   Final    BOTTLES DRAWN AEROBIC AND ANAEROBIC Blood Culture adequate volume Performed at [facility] [address] [place], [place] [address]    Culture   Final    NO GROWTH < 24 HOURS Performed at [facility], [address] [place], [place] [address]    Report Status PENDING  Incomplete  Blood culture (routine x 2)     Status: None (Preliminary result)   Collection Time: 02/13/24  2:01 PM   Specimen: BLOOD LEFT ARM  Result Value Ref Range Status   Specimen Description   Final    BLOOD LEFT ARM Performed at [facility], [address], [place], [place] [address]    Special Requests   Final    BOTTLES DRAWN AEROBIC AND ANAEROBIC Blood Culture adequate volume Performed at [facility] [address], [place], [place] [address]    Culture   Final    NO GROWTH < 24 HOURS Performed at [facility], [address], [place], [place] [address]    Report Status PENDING  Incomplete     Radiology Studies: CT Angio Chest PE W and/or Wo Contrast Result Date: 02/13/2024 CLINICAL DATA:  Pulmonary embolism (PE) suspected, high prob. Hypoxia. Shortness of breath. EXAM: CT ANGIOGRAPHY CHEST WITH CONTRAST TECHNIQUE: Multidetector CT imaging of the chest was performed using the standard protocol during bolus administration of intravenous contrast. Multiplanar CT image reconstructions and MIPs were obtained to evaluate the vascular anatomy. RADIATION DOSE REDUCTION: This exam was performed according to the departmental dose-optimization program which includes automated  exposure control, adjustment of the mA and/or kV according to patient size and/or use of iterative reconstruction technique. CONTRAST:  75mL OMNIPAQUE IOHEXOL 350 MG/ML SOLN COMPARISON:  01/13/2024 FINDINGS: Cardiovascular: No filling defects in the pulmonary arteries to suggest pulmonary emboli. Heart is borderline in size. Three-vessel coronary artery disease and aortic atherosclerosis. No evidence of aortic aneurysm. Mediastinum/Nodes: Mediastinal and right hilar adenopathy again noted, similar to recent study. Trachea and esophagus are unremarkable. Thyroid unremarkable. Lungs/Pleura: Persistent right lower lobe/basilar airspace opacity, stable since prior study. Increasing ground-glass airspace opacities throughout the lungs bilaterally, right greater than left. Moderate centrilobular and paraseptal emphysema. No effusions. Upper Abdomen: No acute findings Musculoskeletal: Chest wall soft tissues are unremarkable. No acute bony abnormality. Review of the MIP images confirms the above findings. IMPRESSION: 1. No evidence of pulmonary embolus. 2. Three-vessel coronary artery disease.  Borderline cardiomegaly. 3. Mediastinal and right  hilar adenopathy unchanged. Number for airspace opacity at the right lung base in the right lower lobe unchanged since recent study. 4. Increasing ground-glass opacities throughout the lungs, right greater than left. This could reflect edema or infection. Aortic Atherosclerosis (ICD10-I70.0) and Emphysema (ICD10-J43.9). Electronically Signed   By: Charlett Nose M.D.   On: 02/13/2024 17:33   DG Chest 2 View Result Date: 02/13/2024 CLINICAL DATA:  lung cancer, SOB, cough, hypoxia. EXAM: CHEST - 2 VIEW COMPARISON:  CT scan chest from 01/13/2024. FINDINGS: There are diffuse reticulonodular opacities throughout bilateral lungs with lower lobe predominance, similar to the prior study and compatible with patient's known history of interstitial lung disease. Bilateral lung fields are  otherwise clear. No dense consolidation or lung collapse. Bilateral costophrenic angles are clear. Normal cardio-mediastinal silhouette. No acute osseous abnormalities. The soft tissues are within normal limits. IMPRESSION: No active cardiopulmonary disease. Redemonstration of chronic interstitial lung disease. Electronically Signed   By: Jules Schick M.D.   On: 02/13/2024 15:23    Scheduled Meds:  amLODipine  2.5 mg Oral Daily   aspirin EC  81 mg Oral Daily   bisoprolol  2.5 mg Oral Daily   budesonide (PULMICORT) nebulizer solution  0.25 mg Nebulization BID   doxycycline  100 mg Oral Q12H   enoxaparin (LOVENOX) injection  40 mg Subcutaneous Q24H   ipratropium  0.5 mg Nebulization TID   levalbuterol  0.63 mg Nebulization TID   pantoprazole  40 mg Oral Q1200   pravastatin  80 mg Oral QHS   Continuous Infusions:  cefTRIAXone (ROCEPHIN)  IV 2 g (02/14/24 2302)     LOS: 2 days   Burnadette Pop, MD [facility] P2/19/2025, 7:30 AM

## 2024-02-15 NOTE — Progress Notes (Signed)
  Echocardiogram 2D Echocardiogram has been performed.  Leda Roys RDCS 02/15/2024, 3:08 PM

## 2024-02-16 DIAGNOSIS — R0902 Hypoxemia: Secondary | ICD-10-CM | POA: Diagnosis not present

## 2024-02-16 LAB — BASIC METABOLIC PANEL
Anion gap: 13 (ref 5–15)
BUN: 30 mg/dL — ABNORMAL HIGH (ref 8–23)
CO2: 22 mmol/L (ref 22–32)
Calcium: 8.8 mg/dL — ABNORMAL LOW (ref 8.9–10.3)
Chloride: 98 mmol/L (ref 98–111)
Creatinine, Ser: 0.7 mg/dL (ref 0.44–1.00)
GFR, Estimated: >60 mL/min (ref >60)
Glucose, Bld: 247 mg/dL — ABNORMAL HIGH (ref 70–99)
Potassium: 3.8 mmol/L (ref 3.5–5.1)
Sodium: 133 mmol/L — ABNORMAL LOW (ref 135–145)

## 2024-02-16 LAB — CBC
HCT: 35.3 % — ABNORMAL LOW (ref 36.0–46.0)
Hemoglobin: 10.7 g/dL — ABNORMAL LOW (ref 12.0–15.0)
MCH: 26.9 pg (ref 26.0–34.0)
MCHC: 30.3 g/dL (ref 30.0–36.0)
MCV: 88.7 fL (ref 80.0–100.0)
Platelets: 292 10*3/uL (ref 150–400)
RBC: 3.98 MIL/uL (ref 3.87–5.11)
RDW: 18.6 % — ABNORMAL HIGH (ref 11.5–15.5)
WBC: 3.3 10*3/uL — ABNORMAL LOW (ref 4.0–10.5)
nRBC: 0.6 % — ABNORMAL HIGH (ref 0.0–0.2)

## 2024-02-16 LAB — GLUCOSE, CAPILLARY
Glucose-Capillary: 301 mg/dL — ABNORMAL HIGH (ref 70–99)
Glucose-Capillary: 313 mg/dL — ABNORMAL HIGH (ref 70–99)
Glucose-Capillary: 324 mg/dL — ABNORMAL HIGH (ref 70–99)
Glucose-Capillary: 368 mg/dL — ABNORMAL HIGH (ref 70–99)
Glucose-Capillary: 544 mg/dL (ref 70–99)
Glucose-Capillary: 599 mg/dL (ref 70–99)

## 2024-02-16 LAB — HEMOGLOBIN A1C
Hgb A1c MFr Bld: 7.4 % — ABNORMAL HIGH (ref 4.8–5.6)
Mean Plasma Glucose: 165.68 mg/dL

## 2024-02-16 LAB — BETA-HYDROXYBUTYRIC ACID: Beta-Hydroxybutyric Acid: 0.1 mmol/L (ref 0.05–0.27)

## 2024-02-16 MED ORDER — INSULIN ASPART 100 UNIT/ML IJ SOLN: 10.0000 [IU] | Freq: Once | INTRAMUSCULAR | Status: DC

## 2024-02-16 MED ORDER — INSULIN ASPART 100 UNIT/ML IJ SOLN: 5.0000 [IU] | Freq: Once | INTRAMUSCULAR | Status: DC

## 2024-02-16 MED ORDER — INSULIN ASPART 100 UNIT/ML IJ SOLN
7.0000 [IU] | Freq: Once | INTRAMUSCULAR | Status: AC
Start: 2024-02-16 — End: 2024-02-16
  Administered 2024-02-16: 7 [IU] via SUBCUTANEOUS

## 2024-02-16 MED ORDER — FUROSEMIDE 10 MG/ML IJ SOLN
40.0000 mg | Freq: Two times a day (BID) | INTRAMUSCULAR | Status: AC
  Administered 2024-02-16 (×2): 40 mg via INTRAVENOUS
  Filled 2024-02-16 (×2): qty 4

## 2024-02-16 MED ORDER — INSULIN GLARGINE-YFGN 100 UNIT/ML ~~LOC~~ SOLN
15.0000 [IU] | Freq: Every day | SUBCUTANEOUS | Status: DC
  Administered 2024-02-16: 15 [IU] via SUBCUTANEOUS
  Filled 2024-02-16 (×2): qty 0.15

## 2024-02-16 MED ORDER — INSULIN GLARGINE 100 UNIT/ML ~~LOC~~ SOLN
15.0000 [IU] | Freq: Every day | SUBCUTANEOUS | Status: DC
  Filled 2024-02-16: qty 0.15

## 2024-02-16 NOTE — Plan of Care (Signed)
  Problem: Education: Goal: Knowledge of General Education information will improve Description: Including pain rating scale, medication(s)/side effects and non-pharmacologic comfort measures Outcome: Progressing   Problem: Health Behavior/Discharge Planning: Goal: Ability to manage health-related needs will improve Outcome: Progressing   Problem: Clinical Measurements: Goal: Respiratory complications will improve Outcome: Progressing Goal: Cardiovascular complication will be avoided Outcome: Progressing   Problem: Nutrition: Goal: Adequate nutrition will be maintained Outcome: Progressing   Problem: Coping: Goal: Level of anxiety will decrease Outcome: Progressing   Problem: Elimination: Goal: Will not experience complications related to urinary retention Outcome: Progressing

## 2024-02-16 NOTE — Progress Notes (Signed)
 PROGRESS NOTE  Lindsey Williamson  ZOX:[id number] DOB: [birth year] DOA: 02/13/2024 PCP: [name], MD   Brief Narrative: Patient is a 73 year old female with history of small cell lung cancer, who presented to the emergency department with complaints shortness of breath .  CT imaging on presentation showed right airspace opacity.  Started on broad spectrum antibiotics.  Hospital course remarkable for oxygen requirement of oxygen.  Started on steroids for possible immunotherapy mediated  pneumonitis.  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  She had immunotherapy about a month ago.  Immunotherapy mediated pneumonitis is a possibility. Radiation-induced lung injury also possible. Will continue to wean oxygen.On 4L this mrng.  Patient not on oxygen at home. 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  CT angiogram of chest did not show any PE but showed  right hilar adenopathy, airspace opacity at the right lung base in the right lower lobe,Increasing ground-glass opacities throughout the lungs, right greater than left.  After discussion with oncology, Dr. Trilby Drummer started on high dose IV steroids.  Plan to continue steroids on discharge with taper. Also being given few doses of Lasix which might help with oxygenation.  Echocardiogram showed EF of 65 to 70%, no wall motion abnormality, indeterminate diastolic parameters, normal right ventricular function.  Chest x-ray done on 2/19 showed diffuse interstitial coarsening/bronchitis changes, no consolidation. Currently afebrile, no leukocytosis.  Procalcitonin reassuring.  Since we cudnot  rule out community-acquired pneumonia, continue current antibiotics.  Cultures have not shown any growth.  Hypertension: Currently on amlodipine, bisoprolol.  Monitor blood  pressure  GERD: Continue Protonix  Hyperlipidemia: Continue statin  Coronary artery disease: Status post CABG.  Continue aspirin.  No anginal symptoms  Hypokalemia: Supplemented and corrected  Diabetes type 2 : Takes Jardiance, metformin, glipizide at home.  A1c of 7.4.  Currently hyperglycemic from steroids.  Continue current insulin regimen.  History of non-small cell lung cancer: Follows with oncology as an outpatient.  On chemotherapy, immunotherapy.  Constipation: Continue bowel regimen  Anxiety: Started on Xanax        DVT prophylaxis:enoxaparin (LOVENOX) injection 40 mg Start: 02/13/24 2200 SCDs Start: 02/13/24 1940     Code Status: Full Code  Family Communication: Called husband Chrissie Noa on phone on 2/20  for discussion, call not received  Patient status:Inpatient  Patient is from :home  Anticipated discharge WJ:XBJY  Estimated DC date:2-3 days   Consultants: None  Procedures:None  Antimicrobials:  Anti-infectives (From admission, onward)    Start     Dose/Rate Route Frequency Ordered Stop   02/13/24 2345  cefTRIAXone (ROCEPHIN) 2 g in sodium chloride 0.9 % 100 mL IVPB        2 g 200 mL/hr over 30 Minutes Intravenous Every 24 hours 02/13/24 2245     02/13/24 2345  doxycycline (VIBRA-TABS) tablet 100 mg        100 mg Oral Every 12 hours 02/13/24 2245         Subjective: Patient seen and examined at the bedside today.  Hemodynamically stable.  On 4 L of oxygen today.  She feels much better today.  Not short of breath like yesterday.  Objective: Vitals:   02/15/24 2033 02/15/24 2055 02/16/24 0516 02/16/24 0843  BP:  129/71 116/73   Pulse:  (!) 108 88   Resp:  16 18   Temp:  Marland Kitchen)  97.4 F (36.3 C) (!) 97.3 F (36.3 C)   TempSrc:  Oral Oral   SpO2: 92% 98% 97% 96%  Weight:      Height:        Intake/Output Summary (Last 24 hours) at 02/16/2024 1106 Last data filed at 02/16/2024 0858 Gross per 24 hour  Intake 120 ml  Output 950 ml  Net -830  ml   Filed Weights   02/13/24 1212 02/13/24 2103  Weight: 65.9 kg 66.3 kg    Examination:  General exam: Overall comfortable, not in distress, pleasant female HEENT: PERRL Respiratory system: Bilateral basilar crackles Cardiovascular system: S1 & S2 heard, RRR.  Gastrointestinal system: Abdomen is nondistended, soft and nontender. Central nervous system: Alert and oriented Extremities: No edema, no clubbing ,no cyanosis Skin: No rashes, no ulcers,no icterus     Data Reviewed: I have personally reviewed following labs and imaging studies  CBC: Recent Labs  Lab 02/13/24 1309 02/16/24 0559  WBC 4.7 3.3*  HGB 11.5* 10.7*  HCT 36.9 35.3*  MCV 87.4 88.7  PLT 277 292   Basic Metabolic Panel: Recent Labs  Lab 02/13/24 1309 02/14/24 0932 02/16/24 0559  NA 132* 137 133*  K 3.2* 4.3 3.8  CL 101 106 98  CO2 17* 18* 22  GLUCOSE 178* 178* 247*  BUN 29* 19 30*  CREATININE 0.61 0.72 0.70  CALCIUM 8.6* 8.5* 8.8*     Recent Results (from the past 240 hours)  Resp panel by RT-PCR (RSV, Flu A&B, Covid) Anterior Nasal Swab     Status: None   Collection Time: 02/13/24  1:09 PM   Specimen: Anterior Nasal Swab  Result Value Ref Range Status   SARS Coronavirus 2 by RT PCR NEGATIVE NEGATIVE Final    Comment: (NOTE) SARS-CoV-2 target nucleic acids are NOT DETECTED.  The SARS-CoV-2 RNA is generally detectable in upper respiratory specimens during the acute phase of infection. The lowest concentration of SARS-CoV-2 viral copies this assay can detect is 138 copies/mL. A negative result does not preclude SARS-Cov-2 infection and should not be used as the sole basis for treatment or other patient management decisions. A negative result may occur with  improper specimen collection/handling, submission of specimen other than nasopharyngeal swab, presence of viral mutation(s) within the areas targeted by this assay, and inadequate number of viral copies(<138 copies/mL). A negative  result must be combined with clinical observations, patient history, and epidemiological information. The expected result is Negative.  Fact Sheet for Patients:  BloggerCourse.com  Fact Sheet for Healthcare Providers:  SeriousBroker.it  This test is no t yet approved or cleared by the [place] FDA and  has been authorized for detection and/or diagnosis of SARS-CoV-2 by FDA under an Emergency Use Authorization (EUA). This EUA will remain  in effect (meaning this test can be used) for the duration of the COVID-19 declaration under Section 564(b)(1) of the Act, 21 U.S.C.section 360bbb-3(b)(1), unless the authorization is terminated  or revoked sooner.       Influenza A by PCR NEGATIVE NEGATIVE Final   Influenza B by PCR NEGATIVE NEGATIVE Final    Comment: (NOTE) The Xpert Xpress SARS-CoV-2/FLU/RSV plus assay is intended as an aid in the diagnosis of influenza from Nasopharyngeal swab specimens and should not be used as a sole basis for treatment. Nasal washings and aspirates are unacceptable for Xpert Xpress SARS-CoV-2/FLU/RSV testing.  Fact Sheet for Patients: BloggerCourse.com  Fact Sheet for Healthcare Providers: SeriousBroker.it  This test is not yet approved or cleared  by the [place] and has been authorized for detection and/or diagnosis of SARS-CoV-2 by FDA under an Emergency Use Authorization (EUA). This EUA will remain in effect (meaning this test can be used) for the duration of the COVID-19 declaration under Section 564(b)(1) of the Act, 21 U.S.C. section 360bbb-3(b)(1), unless the authorization is terminated or revoked.     Resp Syncytial Virus by PCR NEGATIVE NEGATIVE Final    Comment: (NOTE) Fact Sheet for Patients: BloggerCourse.com  Fact Sheet for Healthcare Providers: SeriousBroker.it  This  test is not yet approved or cleared by the [place] FDA and has been authorized for detection and/or diagnosis of SARS-CoV-2 by FDA under an Emergency Use Authorization (EUA). This EUA will remain in effect (meaning this test can be used) for the duration of the COVID-19 declaration under Section 564(b)(1) of the Act, 21 U.S.C. section 360bbb-3(b)(1), unless the authorization is terminated or revoked.  Performed at [facility], [address], [place], [place] [address]   Blood culture (routine x 2)     Status: None (Preliminary result)   Collection Time: 02/13/24  2:00 PM   Specimen: BLOOD  Result Value Ref Range Status   Specimen Description   Final    BLOOD BLOOD RIGHT ARM Performed at [facility], [address], [place], [place] [address]    Special Requests   Final    BOTTLES DRAWN AEROBIC AND ANAEROBIC Blood Culture adequate volume Performed at [facility], [address] [place], [place] [address]    Culture   Final    NO GROWTH 3 DAYS Performed at [facility], [address] [place], [place] [address]    Report Status PENDING  Incomplete  Blood culture (routine x 2)     Status: None (Preliminary result)   Collection Time: 02/13/24  2:01 PM   Specimen: BLOOD LEFT ARM  Result Value Ref Range Status   Specimen Description   Final    BLOOD LEFT ARM Performed at [facility], [address] [place], [place] [address]    Special Requests   Final    BOTTLES DRAWN AEROBIC AND ANAEROBIC Blood Culture adequate volume Performed at [facility], [address], [place], [place] [address]    Culture   Final    NO GROWTH 3 DAYS Performed at [facility], [address], [place], [place] [address]    Report Status PENDING  Incomplete     Radiology Studies: ECHOCARDIOGRAM COMPLETE Result Date: 02/15/2024    ECHOCARDIOGRAM REPORT   Patient Name:   Lindsey Williamson Date  of Exam: 02/15/2024 Medical Rec #:  [id number]          Height:       64.0 in Accession #:    [id number]         Weight:       146.2 lb Date of Birth:  1951-03-05          BSA:          1.712 m Patient Age:    72 years           BP:           152/84 mmHg Patient Gender: F                  HR:           103 bpm. Exam Location:  Inpatient Procedure: 2D Echo, Color  Doppler and Cardiac Doppler (Both Spectral and Color            Flow Doppler were utilized during procedure). Indications:    CHF  History:        Patient has no prior history of Echocardiogram examinations.                 Risk Factors:Diabetes and Hypertension.  Sonographer:    Harriette Bouillon RDCS Referring Phys: [phone] Germaine Shenker IMPRESSIONS  1. Left ventricular ejection fraction, by estimation, is 65 to 70%. The left ventricle has normal function. The left ventricle has no regional wall motion abnormalities. There is mild left ventricular hypertrophy. Left ventricular diastolic parameters are indeterminate.  2. Right ventricular systolic function is normal. The right ventricular size is normal. There is mildly elevated pulmonary artery systolic pressure. The estimated right ventricular systolic pressure is 38.5 mmHg.  3. The mitral valve is degenerative. No evidence of mitral valve regurgitation. No evidence of mitral stenosis.  4. The aortic valve is tricuspid. There is mild calcification of the aortic valve. Aortic valve regurgitation is not visualized. Aortic valve sclerosis is present, with no evidence of aortic valve stenosis.  5. The inferior vena cava is normal in size with greater than 50% respiratory variability, suggesting right atrial pressure of 3 mmHg. FINDINGS  Left Ventricle: Left ventricular ejection fraction, by estimation, is 65 to 70%. The left ventricle has normal function. The left ventricle has no regional wall motion abnormalities. Strain imaging was not performed. The left ventricular internal cavity  size was normal in size.  There is mild left ventricular hypertrophy. Left ventricular diastolic parameters are indeterminate. Right Ventricle: The right ventricular size is normal. No increase in right ventricular wall thickness. Right ventricular systolic function is normal. There is mildly elevated pulmonary artery systolic pressure. The tricuspid regurgitant velocity is 2.98  m/s, and with an assumed right atrial pressure of 3 mmHg, the estimated right ventricular systolic pressure is 38.5 mmHg. Left Atrium: Left atrial size was normal in size. Right Atrium: Right atrial size was normal in size. Pericardium: There is no evidence of pericardial effusion. Mitral Valve: The mitral valve is degenerative in appearance. There is mild thickening of the mitral valve leaflet(s). There is mild calcification of the mitral valve leaflet(s). Mild mitral annular calcification. No evidence of mitral valve regurgitation. No evidence of mitral valve stenosis. Tricuspid Valve: The tricuspid valve is normal in structure. Tricuspid valve regurgitation is mild . No evidence of tricuspid stenosis. Aortic Valve: The aortic valve is tricuspid. There is mild calcification of the aortic valve. Aortic valve regurgitation is not visualized. Aortic valve sclerosis is present, with no evidence of aortic valve stenosis. Pulmonic Valve: The pulmonic valve was normal in structure. Pulmonic valve regurgitation is not visualized. No evidence of pulmonic stenosis. Aorta: The aortic root is normal in size and structure. Venous: The inferior vena cava is normal in size with greater than 50% respiratory variability, suggesting right atrial pressure of 3 mmHg. IAS/Shunts: No atrial level shunt detected by color flow Doppler. Additional Comments: 3D imaging was not performed.  LEFT VENTRICLE PLAX 2D LVIDd:         3.40 cm LVIDs:         2.35 cm LV PW:         1.20 cm LV IVS:        1.20 cm LVOT diam:     2.00 cm LV SV:         44 LV  SV Index:   26 LVOT Area:     3.14 cm  IVC  IVC diam: 1.60 cm LEFT ATRIUM             Index LA diam:        2.50 cm 1.46 cm/m LA Vol (A2C):   35.3 ml 20.62 ml/m LA Vol (A4C):   28.1 ml 16.41 ml/m LA Biplane Vol: 32.1 ml 18.75 ml/m  AORTIC VALVE LVOT Vmax:   93.20 cm/s LVOT Vmean:  57.900 cm/s LVOT VTI:    0.139 m  AORTA Ao Root diam: 2.70 cm Ao Asc diam:  2.90 cm TRICUSPID VALVE TR Peak grad:   35.5 mmHg TR Vmax:        298.00 cm/s  SHUNTS Systemic VTI:  0.14 m Systemic Diam: 2.00 cm Donato Schultz MD Electronically signed by Donato Schultz MD Signature Date/Time: 02/15/2024/3:18:43 PM    Final    DG CHEST PORT 1 VIEW Result Date: 02/15/2024 CLINICAL DATA:  Shortness of breath. EXAM: PORTABLE CHEST 1 VIEW COMPARISON:  Chest radiograph and CT dated 02/13/2024 FINDINGS: Similar diffuse interstitial coarsening and bronchitic changes. There is shallow inspiration. No consolidative changes. There is no pleural effusion pneumothorax. Stable cardiac silhouette. Atherosclerotic calcification of the aorta. No acute osseous pathology. IMPRESSION: No interval change. Electronically Signed   By: Elgie Collard M.D.   On: 02/15/2024 11:53    Scheduled Meds:  amLODipine  2.5 mg Oral Daily   aspirin EC  81 mg Oral Daily   bisoprolol  2.5 mg Oral Daily   budesonide (PULMICORT) nebulizer solution  0.25 mg Nebulization BID   doxycycline  100 mg Oral Q12H   enoxaparin (LOVENOX) injection  40 mg Subcutaneous Q24H   furosemide  40 mg Intravenous BID   guaiFENesin  600 mg Oral BID   insulin aspart  0-15 Units Subcutaneous TID WC   insulin aspart  0-5 Units Subcutaneous QHS   insulin glargine-yfgn  15 Units Subcutaneous Daily   ipratropium  0.5 mg Nebulization TID   levalbuterol  0.63 mg Nebulization TID   methylPREDNISolone (SOLU-MEDROL) injection  40 mg Intravenous Q12H   pantoprazole  40 mg Oral Q1200   polyethylene glycol  17 g Oral Daily   pravastatin  80 mg Oral QHS   senna-docusate  1 tablet Oral BID   Continuous Infusions:  cefTRIAXone (ROCEPHIN)   IV 2 g (02/15/24 2320)     LOS: 3 days   Burnadette Pop, MD [facility] P2/20/2025, 11:06 AM

## 2024-02-16 NOTE — Plan of Care (Signed)

## 2024-02-16 NOTE — Inpatient Diabetes Management (Incomplete)
Inpatient Diabetes Program Recommendations  AACE/ADA: New Consensus Statement on Inpatient Glycemic Control (2015)  Target Ranges:  Prepandial:   less than 140 mg/dL      Peak postprandial:   less than 180 mg/dL (1-2 hours)      Critically ill patients:  140 - 180 mg/dL   Lab Results  Component Value Date   GLUCAP 313 (H) 02/16/2024   HGBA1C 7.4 (H) 02/16/2024    Review of Glycemic Control  Latest Reference Range & Units 02/15/24 21:45 02/16/24 00:19 02/16/24 00:22 02/16/24 02:30 02/16/24 11:35  Glucose-Capillary 70 - 99 mg/dL 956 (HH) 387 (HH) 564 (HH) 368 (H) 313 (H)  (HH): Data is critically high (H): Data is abnormally high  Diabetes history: *** Outpatient Diabetes medications: *** Current orders for Inpatient glycemic control: ***  Inpatient Diabetes Program Recommendations:

## 2024-02-17 DIAGNOSIS — R0902 Hypoxemia: Secondary | ICD-10-CM | POA: Diagnosis not present

## 2024-02-17 LAB — BASIC METABOLIC PANEL
Anion gap: 14 (ref 5–15)
BUN: 39 mg/dL — ABNORMAL HIGH (ref 8–23)
CO2: 21 mmol/L — ABNORMAL LOW (ref 22–32)
Calcium: 8.7 mg/dL — ABNORMAL LOW (ref 8.9–10.3)
Chloride: 96 mmol/L — ABNORMAL LOW (ref 98–111)
Creatinine, Ser: 0.71 mg/dL (ref 0.44–1.00)
GFR, Estimated: >60 mL/min (ref >60)
Glucose, Bld: 282 mg/dL — ABNORMAL HIGH (ref 70–99)
Potassium: 3.9 mmol/L (ref 3.5–5.1)
Sodium: 131 mmol/L — ABNORMAL LOW (ref 135–145)

## 2024-02-17 LAB — GLUCOSE, CAPILLARY
Glucose-Capillary: 301 mg/dL — ABNORMAL HIGH (ref 70–99)
Glucose-Capillary: 272 mg/dL — ABNORMAL HIGH (ref 70–99)
Glucose-Capillary: 109 mg/dL — ABNORMAL HIGH (ref 70–99)
Glucose-Capillary: 442 mg/dL — ABNORMAL HIGH (ref 70–99)

## 2024-02-17 MED ORDER — BISACODYL 10 MG RE SUPP
10.0000 mg | Freq: Once | RECTAL | Status: AC
  Administered 2024-02-17: 10 mg via RECTAL
  Filled 2024-02-17: qty 1

## 2024-02-17 MED ORDER — PREDNISONE 20 MG PO TABS
60.0000 mg | ORAL_TABLET | Freq: Every day | ORAL | Status: DC
  Administered 2024-02-17: 60 mg via ORAL
  Filled 2024-02-17: qty 3

## 2024-02-17 MED ORDER — PREDNISONE 20 MG PO TABS: 40.0000 mg | ORAL_TABLET | Freq: Every day | ORAL | Status: DC

## 2024-02-17 MED ORDER — FUROSEMIDE 10 MG/ML IJ SOLN
40.0000 mg | Freq: Two times a day (BID) | INTRAMUSCULAR | Status: AC
  Administered 2024-02-17 (×2): 40 mg via INTRAVENOUS
  Filled 2024-02-17 (×2): qty 4

## 2024-02-17 MED ORDER — LEVALBUTEROL HCL 0.63 MG/3ML IN NEBU: 0.6300 mg | INHALATION_SOLUTION | Freq: Four times a day (QID) | RESPIRATORY_TRACT | Status: DC | PRN

## 2024-02-17 MED ORDER — INSULIN ASPART 100 UNIT/ML IJ SOLN
5.0000 [IU] | Freq: Three times a day (TID) | INTRAMUSCULAR | Status: DC
  Administered 2024-02-17 – 2024-02-18 (×5): 5 [IU] via SUBCUTANEOUS

## 2024-02-17 MED ORDER — INSULIN GLARGINE-YFGN 100 UNIT/ML ~~LOC~~ SOLN
25.0000 [IU] | Freq: Every day | SUBCUTANEOUS | Status: DC
  Administered 2024-02-17 – 2024-02-18 (×2): 25 [IU] via SUBCUTANEOUS
  Filled 2024-02-17 (×2): qty 0.25

## 2024-02-17 MED ORDER — LEVALBUTEROL HCL 0.63 MG/3ML IN NEBU
0.6300 mg | INHALATION_SOLUTION | Freq: Two times a day (BID) | RESPIRATORY_TRACT | Status: DC
  Administered 2024-02-17 – 2024-02-18 (×2): 0.63 mg via RESPIRATORY_TRACT
  Filled 2024-02-17 (×2): qty 3

## 2024-02-17 MED ORDER — INSULIN ASPART 100 UNIT/ML IJ SOLN
8.0000 [IU] | Freq: Once | INTRAMUSCULAR | Status: AC
  Administered 2024-02-17: 8 [IU] via SUBCUTANEOUS

## 2024-02-17 MED ORDER — IPRATROPIUM BROMIDE 0.02 % IN SOLN
0.5000 mg | Freq: Two times a day (BID) | RESPIRATORY_TRACT | Status: DC
  Administered 2024-02-17 – 2024-02-18 (×2): 0.5 mg via RESPIRATORY_TRACT
  Filled 2024-02-17 (×2): qty 2.5

## 2024-02-17 NOTE — Plan of Care (Signed)
  Problem: Education: Goal: Knowledge of General Education information will improve Description: Including pain rating scale, medication(s)/side effects and non-pharmacologic comfort measures Outcome: Progressing   Problem: Clinical Measurements: Goal: Ability to maintain clinical measurements within normal limits will improve Outcome: Progressing Goal: Respiratory complications will improve Outcome: Progressing Goal: Cardiovascular complication will be avoided Outcome: Progressing   Problem: Nutrition: Goal: Adequate nutrition will be maintained Outcome: Progressing   Problem: Coping: Goal: Level of anxiety will decrease Outcome: Progressing   Problem: Elimination: Goal: Will not experience complications related to bowel motility Outcome: Progressing   Problem: Safety: Goal: Ability to remain free from injury will improve Outcome: Progressing   Problem: Skin Integrity: Goal: Risk for impaired skin integrity will decrease Outcome: Progressing

## 2024-02-17 NOTE — Progress Notes (Signed)
 Mobility Specialist - Progress Note   02/17/24 1318  Oxygen Therapy  SpO2 (!) 88 %  O2 Device Room Air  Patient Activity (if Appropriate) Ambulating  Mobility  Activity Ambulated with assistance in hallway  Level of Assistance Standby assist, set-up cues, supervision of patient - no hands on  Assistive Device Front wheel walker  Distance Ambulated (ft) 250 ft  Activity Response Tolerated well  Mobility Referral Yes  Mobility visit 1 Mobility  Mobility Specialist Start Time (ACUTE ONLY) 1251  Mobility Specialist Stop Time (ACUTE ONLY) 1317  Mobility Specialist Time Calculation (min) (ACUTE ONLY) 26 min   Nurse requested Mobility Specialist to perform oxygen saturation test with pt which includes removing pt from oxygen both at rest and while ambulating.  Below are the results from that testing.     Patient Saturations on Room Air at Rest = spO2 88%  Patient Saturations on Room Air while Ambulating = sp02 88% .  Patient Saturations on 2 Liters of oxygen while Ambulating = sp02 94%  At end of testing pt left in room on 2  Liters of oxygen.  Reported results to nurse.  Pt received in bed and agreeable to mobility. Pt to bed after session with all needs met.    [facility]

## 2024-02-17 NOTE — Plan of Care (Signed)

## 2024-02-17 NOTE — Progress Notes (Signed)
SATURATION QUALIFICATIONS: (This note is used to comply with regulatory documentation for home oxygen)  Patient Saturations on Room Air at Rest = 88%  Patient Saturations on Room Air while Ambulating = 88%  Patient Saturations on 2 Liters of oxygen while Ambulating = 94%  Please briefly explain why patient needs home oxygen: 

## 2024-02-17 NOTE — Progress Notes (Signed)
 PROGRESS NOTE  Lindsey Williamson  WUJ:[id number] DOB: Dec 20, 1951 DOA: 02/13/2024 PCP: [name], MD   Brief Narrative: Patient is a 73 year old female with history of small cell lung cancer, who presented to the emergency department with complaints shortness of breath .  CT imaging on presentation showed right airspace opacity.  Started on broad spectrum antibiotics.  Hospital course remarkable for oxygen requirement of oxygen.  Started on steroids for possible immunotherapy mediated  pneumonitis.  Goal is to wean the oxygen before discharge.  Looks like she will qualify for 2 L of oxygen for home  Assessment & Plan:  Principal Problem:   Hypoxia  Acute hypoxic respiratory failure/immunotherapy mediated pneumonitis:   Continue bronchodilators, nebulizing treatment, antibiotics.  She had immunotherapy about a month ago.  Immunotherapy mediated pneumonitis is a possibility. Radiation-induced lung injury also possible. Will continue to wean oxygen.On 4L this mrng.  Patient not on oxygen at home. 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  CT angiogram of chest did not show any PE but showed  right hilar adenopathy, airspace opacity at the right lung base in the right lower lobe,Increasing ground-glass opacities throughout the lungs, right greater than left.  After discussion with oncology, Dr. Trilby Drummer started on high dose IV steroids.  Plan to continue steroids on discharge with taper. Also being given few doses of Lasix which is helping with oxygenation.  Echocardiogram showed EF of 65 to 70%, no wall motion abnormality, indeterminate diastolic parameters, normal right ventricular function.  Chest x-ray done on 2/19 showed diffuse interstitial coarsening/bronchitis changes, no consolidation. Currently afebrile, no leukocytosis.  Procalcitonin reassuring.  Since we cudnot  rule out community-acquired pneumonia, continue current antibiotics.  Cultures have not shown any  growth.  Hypertension: Currently on amlodipine, bisoprolol.  Monitor blood pressure  GERD: Continue Protonix  Hyperlipidemia: Continue statin  Coronary artery disease: Status post CABG.  Continue aspirin.  No anginal symptoms  Hypokalemia: Supplemented and corrected  Diabetes type 2 : Takes Jardiance, metformin, glipizide at home.  A1c of 7.4.  Currently hyperglycemic from steroids.  Continue current insulin regimen.  History of non-small cell lung cancer: Follows with oncology as an outpatient.  On chemotherapy, immunotherapy.  Constipation: Continue bowel regimen.  Given her Dulcolax suppository today  Anxiety: Started on Xanax  Deconditioning: PT consulted        DVT prophylaxis:enoxaparin (LOVENOX) injection 40 mg Start: 02/13/24 2200 SCDs Start: 02/13/24 1940     Code Status: Full Code  Family Communication: Called and discussed with husband Chrissie Noa on phone on 2/21  Patient status:Inpatient  Patient is from :home  Anticipated discharge NF:AOZH  Estimated DC date:tomorrow   Consultants: None  Procedures:None  Antimicrobials:  Anti-infectives (From admission, onward)    Start     Dose/Rate Route Frequency Ordered Stop   02/13/24 2345  cefTRIAXone (ROCEPHIN) 2 g in sodium chloride 0.9 % 100 mL IVPB        2 g 200 mL/hr over 30 Minutes Intravenous Every 24 hours 02/13/24 2245     02/13/24 2345  doxycycline (VIBRA-TABS) tablet 100 mg        100 mg Oral Every 12 hours 02/13/24 2245         Subjective: Patient seen and examined at bedside today.  Hemodynamically stable.  On 2 L of oxygen per minute.  Speaking in full sentences.  Does not look short of breath like 2 days ago.  Kidney function is stable on IV Lasix.  We discussed about continuing steroid,  Lasix for today.  PT evaluation requested.  Objective: Vitals:   02/17/24 0755 02/17/24 0800 02/17/24 0854 02/17/24 0857  BP:      Pulse:      Resp:      Temp:      TempSrc:      SpO2: (!) 86% 92%  95% 95%  Weight:      Height:        Intake/Output Summary (Last 24 hours) at 02/17/2024 1120 Last data filed at 02/17/2024 0502 Gross per 24 hour  Intake 480 ml  Output 1650 ml  Net -1170 ml   Filed Weights   02/13/24 1212 02/13/24 2103  Weight: 65.9 kg 66.3 kg    Examination:   General exam: Overall comfortable, not in distress HEENT: PERRL Respiratory system: Mildly diminished air sounds on bilateral bases no wheezes or crackles  Cardiovascular system: S1 & S2 heard, RRR.  Gastrointestinal system: Abdomen is nondistended, soft and nontender. Central nervous system: Alert and oriented Extremities: No edema, no clubbing ,no cyanosis Skin: No rashes, no ulcers,no icterus     Data Reviewed: I have personally reviewed following labs and imaging studies  CBC: Recent Labs  Lab 02/13/24 1309 02/16/24 0559  WBC 4.7 3.3*  HGB 11.5* 10.7*  HCT 36.9 35.3*  MCV 87.4 88.7  PLT 277 292   Basic Metabolic Panel: Recent Labs  Lab 02/13/24 1309 02/14/24 0932 02/16/24 0559 02/17/24 0553  NA 132* 137 133* 131*  K 3.2* 4.3 3.8 3.9  CL 101 106 98 96*  CO2 17* 18* 22 21*  GLUCOSE 178* 178* 247* 282*  BUN 29* 19 30* 39*  CREATININE 0.61 0.72 0.70 0.71  CALCIUM 8.6* 8.5* 8.8* 8.7*     Recent Results (from the past 240 hours)  Resp panel by RT-PCR (RSV, Flu A&B, Covid) Anterior Nasal Swab     Status: None   Collection Time: 02/13/24  1:09 PM   Specimen: Anterior Nasal Swab  Result Value Ref Range Status   SARS Coronavirus 2 by RT PCR NEGATIVE NEGATIVE Final    Comment: (NOTE) SARS-CoV-2 target nucleic acids are NOT DETECTED.  The SARS-CoV-2 RNA is generally detectable in upper respiratory specimens during the acute phase of infection. The lowest concentration of SARS-CoV-2 viral copies this assay can detect is 138 copies/mL. A negative result does not preclude SARS-Cov-2 infection and should not be used as the sole basis for treatment or other patient management  decisions. A negative result may occur with  improper specimen collection/handling, submission of specimen other than nasopharyngeal swab, presence of viral mutation(s) within the areas targeted by this assay, and inadequate number of viral copies(<138 copies/mL). A negative result must be combined with clinical observations, patient history, and epidemiological information. The expected result is Negative.  Fact Sheet for Patients:  BloggerCourse.com  Fact Sheet for Healthcare Providers:  SeriousBroker.it  This test is no t yet approved or cleared by the [place] FDA and  has been authorized for detection and/or diagnosis of SARS-CoV-2 by FDA under an Emergency Use Authorization (EUA). This EUA will remain  in effect (meaning this test can be used) for the duration of the COVID-19 declaration under Section 564(b)(1) of the Act, 21 U.S.C.section 360bbb-3(b)(1), unless the authorization is terminated  or revoked sooner.       Influenza A by PCR NEGATIVE NEGATIVE Final   Influenza B by PCR NEGATIVE NEGATIVE Final    Comment: (NOTE) The Xpert Xpress SARS-CoV-2/FLU/RSV plus assay is intended as an aid  in the diagnosis of influenza from Nasopharyngeal swab specimens and should not be used as a sole basis for treatment. Nasal washings and aspirates are unacceptable for Xpert Xpress SARS-CoV-2/FLU/RSV testing.  Fact Sheet for Patients: BloggerCourse.com  Fact Sheet for Healthcare Providers: SeriousBroker.it  This test is not yet approved or cleared by the [place] FDA and has been authorized for detection and/or diagnosis of SARS-CoV-2 by FDA under an Emergency Use Authorization (EUA). This EUA will remain in effect (meaning this test can be used) for the duration of the COVID-19 declaration under Section 564(b)(1) of the Act, 21 U.S.C. section 360bbb-3(b)(1), unless the  authorization is terminated or revoked.     Resp Syncytial Virus by PCR NEGATIVE NEGATIVE Final    Comment: (NOTE) Fact Sheet for Patients: BloggerCourse.com  Fact Sheet for Healthcare Providers: SeriousBroker.it  This test is not yet approved or cleared by the [place] FDA and has been authorized for detection and/or diagnosis of SARS-CoV-2 by FDA under an Emergency Use Authorization (EUA). This EUA will remain in effect (meaning this test can be used) for the duration of the COVID-19 declaration under Section 564(b)(1) of the Act, 21 U.S.C. section 360bbb-3(b)(1), unless the authorization is terminated or revoked.  Performed at [facility], [address], [place], [place] [address]   Blood culture (routine x 2)     Status: None (Preliminary result)   Collection Time: 02/13/24  2:00 PM   Specimen: BLOOD  Result Value Ref Range Status   Specimen Description   Final    BLOOD BLOOD RIGHT ARM Performed at [facility], [address], [place], [place] [address]    Special Requests   Final    BOTTLES DRAWN AEROBIC AND ANAEROBIC Blood Culture adequate volume Performed at [facility], [address], [place], [place] [address]    Culture   Final    NO GROWTH 4 DAYS Performed at [facility], [address] [place], [place] [address]    Report Status PENDING  Incomplete  Blood culture (routine x 2)     Status: None (Preliminary result)   Collection Time: 02/13/24  2:01 PM   Specimen: BLOOD LEFT ARM  Result Value Ref Range Status   Specimen Description   Final    BLOOD LEFT ARM Performed at [facility], [address], [place], [place] [address]    Special Requests   Final    BOTTLES DRAWN AEROBIC AND ANAEROBIC Blood Culture adequate volume Performed at [facility], [address] [place], [place] [address]    Culture   Final     NO GROWTH 4 DAYS Performed at [facility], [address] [place], [place] [address]    Report Status PENDING  Incomplete     Radiology Studies: ECHOCARDIOGRAM COMPLETE Result Date: 02/15/2024    ECHOCARDIOGRAM REPORT   Patient Name:   Lindsey Williamson Date of Exam: 02/15/2024 Medical Rec #:  [id number]          Height:       64.0 in Accession #:    [id number]         Weight:       146.2 lb Date of Birth:  1951/04/15          BSA:          1.712 m Patient Age:    72 years           BP:  152/84 mmHg Patient Gender: F                  HR:           103 bpm. Exam Location:  Inpatient Procedure: 2D Echo, Color Doppler and Cardiac Doppler (Both Spectral and Color            Flow Doppler were utilized during procedure). Indications:    CHF  History:        Patient has no prior history of Echocardiogram examinations.                 Risk Factors:Diabetes and Hypertension.  Sonographer:    Harriette Bouillon RDCS Referring Phys: [phone] Dreden Rivere IMPRESSIONS  1. Left ventricular ejection fraction, by estimation, is 65 to 70%. The left ventricle has normal function. The left ventricle has no regional wall motion abnormalities. There is mild left ventricular hypertrophy. Left ventricular diastolic parameters are indeterminate.  2. Right ventricular systolic function is normal. The right ventricular size is normal. There is mildly elevated pulmonary artery systolic pressure. The estimated right ventricular systolic pressure is 38.5 mmHg.  3. The mitral valve is degenerative. No evidence of mitral valve regurgitation. No evidence of mitral stenosis.  4. The aortic valve is tricuspid. There is mild calcification of the aortic valve. Aortic valve regurgitation is not visualized. Aortic valve sclerosis is present, with no evidence of aortic valve stenosis.  5. The inferior vena cava is normal in size with greater than 50% respiratory variability, suggesting right atrial pressure of 3 mmHg. FINDINGS   Left Ventricle: Left ventricular ejection fraction, by estimation, is 65 to 70%. The left ventricle has normal function. The left ventricle has no regional wall motion abnormalities. Strain imaging was not performed. The left ventricular internal cavity  size was normal in size. There is mild left ventricular hypertrophy. Left ventricular diastolic parameters are indeterminate. Right Ventricle: The right ventricular size is normal. No increase in right ventricular wall thickness. Right ventricular systolic function is normal. There is mildly elevated pulmonary artery systolic pressure. The tricuspid regurgitant velocity is 2.98  m/s, and with an assumed right atrial pressure of 3 mmHg, the estimated right ventricular systolic pressure is 38.5 mmHg. Left Atrium: Left atrial size was normal in size. Right Atrium: Right atrial size was normal in size. Pericardium: There is no evidence of pericardial effusion. Mitral Valve: The mitral valve is degenerative in appearance. There is mild thickening of the mitral valve leaflet(s). There is mild calcification of the mitral valve leaflet(s). Mild mitral annular calcification. No evidence of mitral valve regurgitation. No evidence of mitral valve stenosis. Tricuspid Valve: The tricuspid valve is normal in structure. Tricuspid valve regurgitation is mild . No evidence of tricuspid stenosis. Aortic Valve: The aortic valve is tricuspid. There is mild calcification of the aortic valve. Aortic valve regurgitation is not visualized. Aortic valve sclerosis is present, with no evidence of aortic valve stenosis. Pulmonic Valve: The pulmonic valve was normal in structure. Pulmonic valve regurgitation is not visualized. No evidence of pulmonic stenosis. Aorta: The aortic root is normal in size and structure. Venous: The inferior vena cava is normal in size with greater than 50% respiratory variability, suggesting right atrial pressure of 3 mmHg. IAS/Shunts: No atrial level shunt detected  by color flow Doppler. Additional Comments: 3D imaging was not performed.  LEFT VENTRICLE PLAX 2D LVIDd:         3.40 cm LVIDs:         2.35  cm LV PW:         1.20 cm LV IVS:        1.20 cm LVOT diam:     2.00 cm LV SV:         44 LV SV Index:   26 LVOT Area:     3.14 cm  IVC IVC diam: 1.60 cm LEFT ATRIUM             Index LA diam:        2.50 cm 1.46 cm/m LA Vol (A2C):   35.3 ml 20.62 ml/m LA Vol (A4C):   28.1 ml 16.41 ml/m LA Biplane Vol: 32.1 ml 18.75 ml/m  AORTIC VALVE LVOT Vmax:   93.20 cm/s LVOT Vmean:  57.900 cm/s LVOT VTI:    0.139 m  AORTA Ao Root diam: 2.70 cm Ao Asc diam:  2.90 cm TRICUSPID VALVE TR Peak grad:   35.5 mmHg TR Vmax:        298.00 cm/s  SHUNTS Systemic VTI:  0.14 m Systemic Diam: 2.00 cm Donato Schultz MD Electronically signed by Donato Schultz MD Signature Date/Time: 02/15/2024/3:18:43 PM    Final     Scheduled Meds:  amLODipine  2.5 mg Oral Daily   aspirin EC  81 mg Oral Daily   bisoprolol  2.5 mg Oral Daily   budesonide (PULMICORT) nebulizer solution  0.25 mg Nebulization BID   doxycycline  100 mg Oral Q12H   enoxaparin (LOVENOX) injection  40 mg Subcutaneous Q24H   furosemide  40 mg Intravenous Q12H   guaiFENesin  600 mg Oral BID   insulin aspart  0-15 Units Subcutaneous TID WC   insulin aspart  0-5 Units Subcutaneous QHS   insulin aspart  5 Units Subcutaneous TID WC   insulin glargine-yfgn  25 Units Subcutaneous Daily   ipratropium  0.5 mg Nebulization BID   levalbuterol  0.63 mg Nebulization BID   pantoprazole  40 mg Oral Q1200   polyethylene glycol  17 g Oral Daily   pravastatin  80 mg Oral QHS   predniSONE  60 mg Oral Q breakfast   senna-docusate  1 tablet Oral BID   Continuous Infusions:  cefTRIAXone (ROCEPHIN)  IV 2 g (02/16/24 2300)     LOS: 4 days   Burnadette Pop, MD [facility] P2/21/2025, 11:20 AM

## 2024-02-18 ENCOUNTER — Other Ambulatory Visit (HOSPITAL_COMMUNITY): Payer: Self-pay

## 2024-02-18 ENCOUNTER — Encounter: Payer: Self-pay | Admitting: Internal Medicine

## 2024-02-18 DIAGNOSIS — R0902 Hypoxemia: Secondary | ICD-10-CM | POA: Diagnosis not present

## 2024-02-18 LAB — BASIC METABOLIC PANEL
Anion gap: 11 (ref 5–15)
BUN: 46 mg/dL — ABNORMAL HIGH (ref 8–23)
CO2: 23 mmol/L (ref 22–32)
Calcium: 8.5 mg/dL — ABNORMAL LOW (ref 8.9–10.3)
Chloride: 101 mmol/L (ref 98–111)
Creatinine, Ser: 0.7 mg/dL (ref 0.44–1.00)
GFR, Estimated: >60 mL/min (ref >60)
Glucose, Bld: 166 mg/dL — ABNORMAL HIGH (ref 70–99)
Potassium: 3.2 mmol/L — ABNORMAL LOW (ref 3.5–5.1)
Sodium: 135 mmol/L (ref 135–145)

## 2024-02-18 LAB — GLUCOSE, CAPILLARY
Glucose-Capillary: 167 mg/dL — ABNORMAL HIGH (ref 70–99)
Glucose-Capillary: 156 mg/dL — ABNORMAL HIGH (ref 70–99)

## 2024-02-18 LAB — CULTURE, BLOOD (ROUTINE X 2)
Culture: NO GROWTH
Culture: NO GROWTH
Special Requests: ADEQUATE
Special Requests: ADEQUATE

## 2024-02-18 MED ORDER — PANTOPRAZOLE SODIUM 40 MG PO TBEC
40.0000 mg | DELAYED_RELEASE_TABLET | Freq: Every day | ORAL | 0 refills | Status: DC
  Filled 2024-02-18: qty 30, 30d supply, fill #0

## 2024-02-18 MED ORDER — PREDNISONE 5 MG PO TABS: 10.0000 mg | ORAL_TABLET | Freq: Every day | ORAL | Status: DC

## 2024-02-18 MED ORDER — PREDNISONE 10 MG PO TABS
ORAL_TABLET | ORAL | 0 refills | Status: AC
  Filled 2024-02-18: qty 30, 12d supply, fill #0

## 2024-02-18 MED ORDER — PREDNISONE 20 MG PO TABS: 30.0000 mg | ORAL_TABLET | Freq: Every day | ORAL | Status: DC

## 2024-02-18 MED ORDER — POTASSIUM CHLORIDE CRYS ER 20 MEQ PO TBCR
40.0000 meq | EXTENDED_RELEASE_TABLET | Freq: Every day | ORAL | 0 refills | Status: DC
  Filled 2024-02-18: qty 6, 3d supply, fill #0

## 2024-02-18 MED ORDER — GLIPIZIDE ER 5 MG PO TB24
5.0000 mg | ORAL_TABLET | Freq: Every day | ORAL | 0 refills | Status: DC
  Filled 2024-02-18: qty 30, 30d supply, fill #0

## 2024-02-18 MED ORDER — GUAIFENESIN ER 600 MG PO TB12
600.0000 mg | ORAL_TABLET | Freq: Two times a day (BID) | ORAL | 0 refills | Status: AC
  Filled 2024-02-18: qty 14, 7d supply, fill #0

## 2024-02-18 MED ORDER — PREDNISONE 20 MG PO TABS
20.0000 mg | ORAL_TABLET | Freq: Every day | ORAL | Status: DC
Start: 2024-02-24 — End: 2024-02-18

## 2024-02-18 MED ORDER — PREDNISONE 10 MG PO TABS
10.0000 mg | ORAL_TABLET | Freq: Every day | ORAL | 0 refills | Status: AC
  Filled 2024-02-18: qty 3, 3d supply, fill #0

## 2024-02-18 MED ORDER — POLYETHYLENE GLYCOL 3350 17 G PO PACK
17.0000 g | PACK | Freq: Every day | ORAL | 0 refills | Status: DC
  Filled 2024-02-18: qty 14, 14d supply, fill #0

## 2024-02-18 MED ORDER — POTASSIUM CHLORIDE CRYS ER 20 MEQ PO TBCR
40.0000 meq | EXTENDED_RELEASE_TABLET | Freq: Once | ORAL | Status: AC
  Administered 2024-02-18: 40 meq via ORAL
  Filled 2024-02-18: qty 2

## 2024-02-18 MED ORDER — AMOXICILLIN-POT CLAVULANATE 875-125 MG PO TABS
1.0000 | ORAL_TABLET | Freq: Two times a day (BID) | ORAL | 0 refills | Status: AC
  Filled 2024-02-18: qty 4, 2d supply, fill #0

## 2024-02-18 MED ORDER — PREDNISONE 20 MG PO TABS
40.0000 mg | ORAL_TABLET | Freq: Every day | ORAL | Status: DC
  Administered 2024-02-18: 40 mg via ORAL
  Filled 2024-02-18: qty 2

## 2024-02-18 MED ORDER — PREDNISONE 20 MG PO TABS
20.0000 mg | ORAL_TABLET | Freq: Every day | ORAL | 0 refills | Status: AC
  Filled 2024-02-18 (×2): qty 3, 3d supply, fill #0

## 2024-02-18 MED ORDER — PREDNISONE 10 MG PO TABS
30.0000 mg | ORAL_TABLET | Freq: Every day | ORAL | 0 refills | Status: AC
  Filled 2024-02-18: qty 9, 3d supply, fill #0

## 2024-02-18 NOTE — Evaluation (Signed)
 Physical Therapy Evaluation Patient Details Name: Lindsey Williamson MRN: [id number] DOB: 1951-06-27 Today's Date: 02/18/2024  History of Present Illness  72 year old female who presented to the emergency department with complaints shortness of breath and admitted 02/13/24 for acute hypoxic respiratory failure/immunotherapy mediated pneumonitis.  PMHx: small cell lung cancer, HTN, DM, angioedema  Clinical Impression  Pt admitted with above diagnosis.  Pt currently with functional limitations due to the deficits listed below (see PT Problem List). Pt will benefit from acute skilled PT to increase their independence and safety with mobility to allow discharge.  Pt assisted with ambulating in hallway and required 6L O2 for SPO2 93% (MD notified).  Pt with possible d/c home today. Recommend HHPT and RW upon d/c.         If plan is discharge home, recommend the following: A little help with bathing/dressing/bathroom;A little help with walking and/or transfers;Assistance with cooking/housework;Help with stairs or ramp for entrance   Can travel by private vehicle        Equipment Recommendations Rolling walker (2 wheels)  Recommendations for Other Services       Functional Status Assessment Patient has had a recent decline in their functional status and demonstrates the ability to make significant improvements in function in a reasonable and predictable amount of time.     Precautions / Restrictions Precautions Precautions: Fall Precaution/Restrictions Comments: monitor sats      Mobility  Bed Mobility Overal bed mobility: Modified Independent                  Transfers Overall transfer level: Needs assistance Equipment used: None Transfers: Sit to/from Stand Sit to Stand: Contact guard assist           General transfer comment: CGA for safety, LOB upon rise but pt able to self correct    Ambulation/Gait Ambulation/Gait assistance: Contact guard assist Gait Distance  (Feet): 100 Feet Assistive device: Rolling walker (2 wheels) Gait Pattern/deviations: Step-through pattern, Decreased stride length Gait velocity: decr     General Gait Details: utilized RW for stability and endurance; cues for posture and RW positioning, SpO2 dropped to 84% on 3L, also dropped again to 84% on 5L, pt requiring 6L O2 [place] for SpO2 93% during ambulation; required a couple standing rest breaks with cues for pursed lip breathing  Stairs            Wheelchair Mobility     Tilt Bed    Modified Rankin (Stroke Patients Only)       Balance Overall balance assessment: Mild deficits observed, not formally tested                                           Pertinent Vitals/Pain Pain Assessment Pain Assessment: No/denies pain    Home Living Family/patient expects to be discharged to:: Private residence Living Arrangements: Spouse/significant other   Type of Home: Apartment Home Access: Stairs to enter Entrance Stairs-Rails: Right Entrance Stairs-Number of Steps: 14   Home Layout: One level Home Equipment: None      Prior Function Prior Level of Function : Independent/Modified Independent                     Extremity/Trunk Assessment        Lower Extremity Assessment Lower Extremity Assessment: Generalized weakness    Cervical / Trunk Assessment Cervical / Trunk Assessment: Normal  Communication   Communication Communication: No apparent difficulties    Cognition Arousal: Alert Behavior During Therapy: WFL for tasks assessed/performed   PT - Cognitive impairments: No apparent impairments                         Following commands: Intact       Cueing       General Comments      Exercises     Assessment/Plan    PT Assessment Patient needs continued PT services  PT Problem List Decreased activity tolerance;Decreased balance;Decreased mobility;Cardiopulmonary status limiting activity;Decreased  knowledge of use of DME       PT Treatment Interventions DME instruction;Gait training;Balance training;Functional mobility training;Stair training;Patient/family education;Therapeutic activities;Therapeutic exercise    PT Goals (Current goals can be found in the Care Plan section)  Acute Rehab PT Goals PT Goal Formulation: With patient Time For Goal Achievement: 03/03/24 Potential to Achieve Goals: Good    Frequency Min 1X/week     Co-evaluation               AM-PAC PT "6 Clicks" Mobility  Outcome Measure Help needed turning from your back to your side while in a flat bed without using bedrails?: None Help needed moving from lying on your back to sitting on the side of a flat bed without using bedrails?: None Help needed moving to and from a bed to a chair (including a wheelchair)?: A Little Help needed standing up from a chair using your arms (e.g., wheelchair or bedside chair)?: A Little Help needed to walk in hospital room?: A Little Help needed climbing 3-5 steps with a railing? : A Little 6 Click Score: 20    End of Session Equipment Utilized During Treatment: Gait belt;Oxygen Activity Tolerance: Patient tolerated treatment well Patient left: in bed;with call bell/phone within reach   PT Visit Diagnosis: Difficulty in walking, not elsewhere classified (R26.2)    Time: 1610-9604 PT Time Calculation (min) (ACUTE ONLY): 17 min   Charges:   PT Evaluation $PT Eval Low Complexity: 1 Low   PT General Charges $$ ACUTE PT VISIT: 1 Visit        Thomasene Mohair PT, DPT Physical Therapist Acute Rehabilitation Services Office: [phone]   Janan Halter [place] 02/18/2024, 11:41 AM

## 2024-02-18 NOTE — Progress Notes (Signed)
 TOC meds in a secure bag delivered to pt in room by this RN- Pt removed from tele - primary nurse notified central tel & updated on med delivery. Pt dressing for d/c to home- RW delivered at this time too.

## 2024-02-18 NOTE — Progress Notes (Signed)
  Patient Details Name: Lindsey Williamson MRN: [id number] DOB: March 21, 1951  SATURATION QUALIFICATIONS: (This note is used to comply with regulatory documentation for home oxygen)  Patient Saturations on Room Air at Rest = 94%  Patient Saturations on Room Air while standing and donning robe: 82%  Patient Saturations on 6 Liters of oxygen while Ambulating = 93%  Please briefly explain why patient needs home oxygen: to improve oxygen saturations with physical activities such as ADLs and ambulation   Carlyon Prows 02/18/2024, 11:42 AM Paulino Door, DPT Physical Therapist Acute Rehabilitation Services Office: [phone]

## 2024-02-18 NOTE — TOC CM/SW Note (Signed)

## 2024-02-18 NOTE — Discharge Summary (Signed)
 Physician Discharge Summary  Lindsey Williamson NGE:[id number] DOB: 1951/03/17 DOA: 02/13/2024  PCP: [name], MD  Admit date: 02/13/2024 Discharge date: 02/18/2024  Admitted From: Home Disposition:  Home  Discharge Condition:Stable CODE STATUS:FULL Diet recommendation:  Carb Modified  Brief/Interim Summary: Patient is a 73 year old female with history of small cell lung cancer, who presented to the emergency department with complaints shortness of breath . CT imaging on presentation showed right airspace opacity. Started on broad spectrum antibiotics. Hospital course remarkable for oxygen requirement of oxygen. Started on steroids for possible immunotherapy mediated pneumonitis.  Patient is clinically better.  She qualified for home oxygen for 2 L/min.  PT recommend home with on discharge.  We recommend to follow-up with her PCP and oncologist next week.  Following problems were addressed during the hospitalization:  Acute hypoxic respiratory failure/immunotherapy mediated pneumonitis:   Continue bronchodilators, nebulizing treatment, antibiotics.  She had immunotherapy about a month ago.  Immunotherapy mediated pneumonitis is a possibility. Radiation-induced lung injury also possible. 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  CT angiogram of chest did not show any PE but showed  right hilar adenopathy, airspace opacity at the right lung base in the right lower lobe,Increasing ground-glass opacities throughout the lungs, right greater than left.  After discussion with oncology, Dr. Trilby Drummer started on high dose IV steroids.  Plan to continue steroids on discharge with taper. Also being given few doses of Lasix which was helping with oxygenation.  Echocardiogram showed EF of 65 to 70%, no wall motion abnormality, indeterminate diastolic parameters, normal right ventricular function.  Chest x-ray done on 2/19 showed diffuse interstitial coarsening/bronchitis changes, no  consolidation. Currently afebrile, no leukocytosis.  Procalcitonin reassuring.  Cultures have not shown any growth. Continue prednisone on tapering dose on discharge.   Hypertension: Currently on amlodipine, bisoprolol.  Monitor blood pressure at home   GERD: Continue Protonix   Hyperlipidemia: Continue statin   Coronary artery disease: Status post CABG.  Continue aspirin.  No anginal symptoms   Hypokalemia: Supplemented    Diabetes type 2 : Takes Jardiance, metformin, glipizide at home.  A1c of 7.4.  Currently hyperglycemic from steroids.  Increased the dose of glipizide on discharge  History of non-small cell lung cancer: Follows with oncology as an outpatient.  On chemotherapy, immunotherapy.   Constipation: Continue bowel regimen.    Deconditioning: PT consulted.  Recommend home health   Discharge Diagnoses:  Principal Problem:   Hypoxia    Discharge Instructions  Discharge Instructions     Diet Carb Modified   Complete by: As directed    Discharge instructions   Complete by: As directed    1)Please take prescribed medications as instructed 2)Follow up with your PCP next week 2)Follow up with your oncologist in 1 to 2 weeks 4)Monitor your blood sugars at home   Increase activity slowly   Complete by: As directed       Allergies as of 02/18/2024       Reactions   Benzonatate Palpitations   Lisinopril Swelling, Other (See Comments)   Patient ended up in the ED with a swollen mouth and tongue   Aspirin Palpitations, Nausea And Vomiting   Can tolerate baby aspirin without difficulty   Azithromycin Palpitations   Codeine Palpitations        Medication List     STOP taking these medications    famotidine 20 MG tablet Commonly known as: PEPCID   methylPREDNISolone 4 MG Tbpk tablet Commonly known as: MEDROL DOSEPAK  omeprazole 20 MG capsule Commonly known as: PRILOSEC       TAKE these medications    amLODipine 5 MG tablet Commonly known as:  NORVASC Take 2.5 mg by mouth in the morning.   amoxicillin-clavulanate 875-125 MG tablet Commonly known as: AUGMENTIN Take 1 tablet by mouth 2 (two) times daily for 2 days.   aspirin EC 81 MG tablet Take 81 mg by mouth daily. Swallow whole.   azelastine 0.1 % nasal spray Commonly known as: ASTELIN Place 2 sprays into both nostrils 2 (two) times daily as needed for rhinitis.   bisoprolol 5 MG tablet Commonly known as: ZEBETA Take 2.5 mg by mouth daily.   cetirizine 10 MG tablet Commonly known as: ZyrTEC Allergy Take 1 tablet (10 mg total) by mouth daily.   diclofenac Sodium 1 % Gel Commonly known as: VOLTAREN Apply 2 g topically 2 (two) times daily as needed (for pain).   empagliflozin 25 MG Tabs tablet Commonly known as: JARDIANCE Take 25 mg by mouth daily at 6 PM.   ferrous sulfate 325 (65 FE) MG tablet Take 325 mg by mouth daily with breakfast.   glipiZIDE 5 MG 24 hr tablet Commonly known as: GLUCOTROL XL Take 1 tablet (5 mg total) by mouth daily with breakfast. What changed:  medication strength how much to take   guaiFENesin 600 MG 12 hr tablet Commonly known as: MUCINEX Take 1 tablet (600 mg total) by mouth 2 (two) times daily for 7 days.   metFORMIN 500 MG 24 hr tablet Commonly known as: GLUCOPHAGE-XR Take 1,000 mg by mouth in the morning and at bedtime.   nystatin powder Commonly known as: MYCOSTATIN/NYSTOP Apply 1 Application topically 3 (three) times daily. What changed:  when to take this reasons to take this   pantoprazole 40 MG tablet Commonly known as: PROTONIX Take 1 tablet (40 mg total) by mouth daily at 12 noon.   polyethylene glycol 17 g packet Commonly known as: MIRALAX / GLYCOLAX Take 17 g by mouth daily. Start taking on: February 19, 2024   potassium chloride SA 20 MEQ tablet Commonly known as: KLOR-CON M Take 2 tablets (40 mEq total) by mouth daily for 3 doses. Start taking on: February 19, 2024   pravastatin 80 MG  tablet Commonly known as: PRAVACHOL Take 80 mg by mouth at bedtime.   predniSONE 20 MG tablet Commonly known as: DELTASONE Take 2 tablets (40 mg total) by mouth daily with breakfast for 3 days.   predniSONE 10 MG tablet Commonly known as: DELTASONE Take 3 tablets (30 mg total) by mouth daily with breakfast for 3 days. Start taking on: February 21, 2024   predniSONE 20 MG tablet Commonly known as: DELTASONE Take 1 tablet (20 mg total) by mouth daily with breakfast for 3 days. Start taking on: February 24, 2024   predniSONE 10 MG tablet Commonly known as: DELTASONE Take 1 tablet (10 mg total) by mouth daily with breakfast for 3 days. Start taking on: February 27, 2024   prochlorperazine 10 MG tablet Commonly known as: COMPAZINE TAKE 1 TABLET(10 MG) BY MOUTH EVERY 6 HOURS AS NEEDED What changed: See the new instructions.   sucralfate 1 g tablet Commonly known as: CARAFATE TAKE 1 TABLET(1 GRAM) BY MOUTH FOUR TIMES DAILY(WITH MEALS AND AT BEDTIME)               Durable Medical Equipment  (From admission, onward)           Start  Ordered   02/18/24 1049  For home use only DME Walker rolling  Once       Question Answer Comment  Walker: With 5 Inch Wheels   Patient needs a walker to treat with the following condition Balance disorder      02/18/24 1049   02/17/24 1427  For home use only DME oxygen  Once       Question Answer Comment  Length of Need Lifetime   Mode or (Route) Nasal cannula   Liters per Minute 2   Frequency Continuous (stationary and portable oxygen unit needed)   Oxygen delivery system Gas      02/17/24 1426            Follow-up Information     [name], MD. Schedule an appointment as soon as possible for a visit in 1 week(s).   Specialty: Family Medicine Contact information: [address] [place] [address] [phone]         Si Gaul, MD. Schedule an appointment as soon as possible for a  visit in 1 week(s).   Specialty: Oncology Contact information: [address] [place] [address] [phone]                Allergies  Allergen Reactions   Benzonatate Palpitations   Lisinopril Swelling and Other (See Comments)    Patient ended up in the ED with a swollen mouth and tongue   Aspirin Palpitations and Nausea And Vomiting    Can tolerate baby aspirin without difficulty   Azithromycin Palpitations   Codeine Palpitations    Consultations: None   Procedures/Studies: ECHOCARDIOGRAM COMPLETE Result Date: 02/15/2024    ECHOCARDIOGRAM REPORT   Patient Name:   Lindsey Williamson Date of Exam: 02/15/2024 Medical Rec #:  [id number]          Height:       64.0 in Accession #:    [id number]         Weight:       146.2 lb Date of Birth:  1951-11-18          BSA:          1.712 m Patient Age:    72 years           BP:           152/84 mmHg Patient Gender: F                  HR:           103 bpm. Exam Location:  Inpatient Procedure: 2D Echo, Color Doppler and Cardiac Doppler (Both Spectral and Color            Flow Doppler were utilized during procedure). Indications:    CHF  History:        Patient has no prior history of Echocardiogram examinations.                 Risk Factors:Diabetes and Hypertension.  Sonographer:    Harriette Bouillon RDCS Referring Phys: [phone] Kacey Dysert IMPRESSIONS  1. Left ventricular ejection fraction, by estimation, is 65 to 70%. The left ventricle has normal function. The left ventricle has no regional wall motion abnormalities. There is mild left ventricular hypertrophy. Left ventricular diastolic parameters are indeterminate.  2. Right ventricular systolic function is normal. The right ventricular size is normal. There is mildly elevated pulmonary artery systolic pressure. The estimated right ventricular systolic pressure is 38.5 mmHg.  3. The mitral valve is degenerative. No evidence of mitral valve regurgitation. No evidence of mitral  stenosis.  4. The aortic valve is tricuspid. There is mild calcification of the aortic valve. Aortic valve regurgitation is not visualized. Aortic valve sclerosis is present, with no evidence of aortic valve stenosis.  5. The inferior vena cava is normal in size with greater than 50% respiratory variability, suggesting right atrial pressure of 3 mmHg. FINDINGS  Left Ventricle: Left ventricular ejection fraction, by estimation, is 65 to 70%. The left ventricle has normal function. The left ventricle has no regional wall motion abnormalities. Strain imaging was not performed. The left ventricular internal cavity  size was normal in size. There is mild left ventricular hypertrophy. Left ventricular diastolic parameters are indeterminate. Right Ventricle: The right ventricular size is normal. No increase in right ventricular wall thickness. Right ventricular systolic function is normal. There is mildly elevated pulmonary artery systolic pressure. The tricuspid regurgitant velocity is 2.98  m/s, and with an assumed right atrial pressure of 3 mmHg, the estimated right ventricular systolic pressure is 38.5 mmHg. Left Atrium: Left atrial size was normal in size. Right Atrium: Right atrial size was normal in size. Pericardium: There is no evidence of pericardial effusion. Mitral Valve: The mitral valve is degenerative in appearance. There is mild thickening of the mitral valve leaflet(s). There is mild calcification of the mitral valve leaflet(s). Mild mitral annular calcification. No evidence of mitral valve regurgitation. No evidence of mitral valve stenosis. Tricuspid Valve: The tricuspid valve is normal in structure. Tricuspid valve regurgitation is mild . No evidence of tricuspid stenosis. Aortic Valve: The aortic valve is tricuspid. There is mild calcification of the aortic valve. Aortic valve regurgitation is not visualized. Aortic valve sclerosis is present, with no evidence of aortic valve stenosis. Pulmonic Valve:  The pulmonic valve was normal in structure. Pulmonic valve regurgitation is not visualized. No evidence of pulmonic stenosis. Aorta: The aortic root is normal in size and structure. Venous: The inferior vena cava is normal in size with greater than 50% respiratory variability, suggesting right atrial pressure of 3 mmHg. IAS/Shunts: No atrial level shunt detected by color flow Doppler. Additional Comments: 3D imaging was not performed.  LEFT VENTRICLE PLAX 2D LVIDd:         3.40 cm LVIDs:         2.35 cm LV PW:         1.20 cm LV IVS:        1.20 cm LVOT diam:     2.00 cm LV SV:         44 LV SV Index:   26 LVOT Area:     3.14 cm  IVC IVC diam: 1.60 cm LEFT ATRIUM             Index LA diam:        2.50 cm 1.46 cm/m LA Vol (A2C):   35.3 ml 20.62 ml/m LA Vol (A4C):   28.1 ml 16.41 ml/m LA Biplane Vol: 32.1 ml 18.75 ml/m  AORTIC VALVE LVOT Vmax:   93.20 cm/s LVOT Vmean:  57.900 cm/s LVOT VTI:    0.139 m  AORTA Ao Root diam: 2.70 cm Ao Asc diam:  2.90 cm TRICUSPID VALVE TR Peak grad:   35.5 mmHg TR Vmax:        298.00 cm/s  SHUNTS Systemic VTI:  0.14 m Systemic Diam: 2.00 cm Donato Schultz MD Electronically signed by Donato Schultz MD Signature Date/Time: 02/15/2024/3:18:43 PM  Final    DG CHEST PORT 1 VIEW Result Date: 02/15/2024 CLINICAL DATA:  Shortness of breath. EXAM: PORTABLE CHEST 1 VIEW COMPARISON:  Chest radiograph and CT dated 02/13/2024 FINDINGS: Similar diffuse interstitial coarsening and bronchitic changes. There is shallow inspiration. No consolidative changes. There is no pleural effusion pneumothorax. Stable cardiac silhouette. Atherosclerotic calcification of the aorta. No acute osseous pathology. IMPRESSION: No interval change. Electronically Signed   By: Elgie Collard M.D.   On: 02/15/2024 11:53   CT Angio Chest PE W and/or Wo Contrast Result Date: 02/13/2024 CLINICAL DATA:  Pulmonary embolism (PE) suspected, high prob. Hypoxia. Shortness of breath. EXAM: CT ANGIOGRAPHY CHEST WITH CONTRAST  TECHNIQUE: Multidetector CT imaging of the chest was performed using the standard protocol during bolus administration of intravenous contrast. Multiplanar CT image reconstructions and MIPs were obtained to evaluate the vascular anatomy. RADIATION DOSE REDUCTION: This exam was performed according to the departmental dose-optimization program which includes automated exposure control, adjustment of the mA and/or kV according to patient size and/or use of iterative reconstruction technique. CONTRAST:  75mL OMNIPAQUE IOHEXOL 350 MG/ML SOLN COMPARISON:  01/13/2024 FINDINGS: Cardiovascular: No filling defects in the pulmonary arteries to suggest pulmonary emboli. Heart is borderline in size. Three-vessel coronary artery disease and aortic atherosclerosis. No evidence of aortic aneurysm. Mediastinum/Nodes: Mediastinal and right hilar adenopathy again noted, similar to recent study. Trachea and esophagus are unremarkable. Thyroid unremarkable. Lungs/Pleura: Persistent right lower lobe/basilar airspace opacity, stable since prior study. Increasing ground-glass airspace opacities throughout the lungs bilaterally, right greater than left. Moderate centrilobular and paraseptal emphysema. No effusions. Upper Abdomen: No acute findings Musculoskeletal: Chest wall soft tissues are unremarkable. No acute bony abnormality. Review of the MIP images confirms the above findings. IMPRESSION: 1. No evidence of pulmonary embolus. 2. Three-vessel coronary artery disease.  Borderline cardiomegaly. 3. Mediastinal and right hilar adenopathy unchanged. Number for airspace opacity at the right lung base in the right lower lobe unchanged since recent study. 4. Increasing ground-glass opacities throughout the lungs, right greater than left. This could reflect edema or infection. Aortic Atherosclerosis (ICD10-I70.0) and Emphysema (ICD10-J43.9). Electronically Signed   By: Charlett Nose M.D.   On: 02/13/2024 17:33   DG Chest 2 View Result Date:  02/13/2024 CLINICAL DATA:  lung cancer, SOB, cough, hypoxia. EXAM: CHEST - 2 VIEW COMPARISON:  CT scan chest from 01/13/2024. FINDINGS: There are diffuse reticulonodular opacities throughout bilateral lungs with lower lobe predominance, similar to the prior study and compatible with patient's known history of interstitial lung disease. Bilateral lung fields are otherwise clear. No dense consolidation or lung collapse. Bilateral costophrenic angles are clear. Normal cardio-mediastinal silhouette. No acute osseous abnormalities. The soft tissues are within normal limits. IMPRESSION: No active cardiopulmonary disease. Redemonstration of chronic interstitial lung disease. Electronically Signed   By: Jules Schick M.D.   On: 02/13/2024 15:23      Subjective: Patient seen and examined at bedside today.  Hemodynamically stable.  She qualified for home oxygen.  She was seen by physical therapy, home with recommended.  Her shortness of breath, cough is better today.  She had a good bowel movement yesterday.  Medically stable for discharge.I called her husband Mr. Lindsey Williamson to discuss about discharge planning, call not received  Discharge Exam: Vitals:   02/18/24 0807 02/18/24 0906  BP: 139/75   Pulse: 83   Resp:    Temp:    SpO2: 96% 93%   Vitals:   02/17/24 2119 02/18/24 0524 02/18/24 0807 02/18/24 0906  BP: Marland Kitchen)  140/70 122/60 139/75   Pulse: 93 79 83   Resp: 20 20    Temp: 97.7 F (36.5 C) (!) 97.5 F (36.4 C)    TempSrc: Oral Oral    SpO2: 96% 99% 96% 93%  Weight:      Height:        General: Pt is alert, awake, not in acute distress Cardiovascular: RRR, S1/S2 +, no rubs, no gallops Respiratory: CTA bilaterally, no wheezing, no rhonchi, mildly diminished air sounds on bilateral bases,few crackles Abdominal: Soft, NT, ND, bowel sounds + Extremities: no edema, no cyanosis    The results of significant diagnostics from this hospitalization (including imaging, microbiology,  ancillary and laboratory) are listed below for reference.     Microbiology: Recent Results (from the past 240 hours)  Resp panel by RT-PCR (RSV, Flu A&B, Covid) Anterior Nasal Swab     Status: None   Collection Time: 02/13/24  1:09 PM   Specimen: Anterior Nasal Swab  Result Value Ref Range Status   SARS Coronavirus 2 by RT PCR NEGATIVE NEGATIVE Final    Comment: (NOTE) SARS-CoV-2 target nucleic acids are NOT DETECTED.  The SARS-CoV-2 RNA is generally detectable in upper respiratory specimens during the acute phase of infection. The lowest concentration of SARS-CoV-2 viral copies this assay can detect is 138 copies/mL. A negative result does not preclude SARS-Cov-2 infection and should not be used as the sole basis for treatment or other patient management decisions. A negative result may occur with  improper specimen collection/handling, submission of specimen other than nasopharyngeal swab, presence of viral mutation(s) within the areas targeted by this assay, and inadequate number of viral copies(<138 copies/mL). A negative result must be combined with clinical observations, patient history, and epidemiological information. The expected result is Negative.  Fact Sheet for Patients:  BloggerCourse.com  Fact Sheet for Healthcare Providers:  SeriousBroker.it  This test is no t yet approved or cleared by the [place] FDA and  has been authorized for detection and/or diagnosis of SARS-CoV-2 by FDA under an Emergency Use Authorization (EUA). This EUA will remain  in effect (meaning this test can be used) for the duration of the COVID-19 declaration under Section 564(b)(1) of the Act, 21 U.S.C.section 360bbb-3(b)(1), unless the authorization is terminated  or revoked sooner.       Influenza A by PCR NEGATIVE NEGATIVE Final   Influenza B by PCR NEGATIVE NEGATIVE Final    Comment: (NOTE) The Xpert Xpress SARS-CoV-2/FLU/RSV  plus assay is intended as an aid in the diagnosis of influenza from Nasopharyngeal swab specimens and should not be used as a sole basis for treatment. Nasal washings and aspirates are unacceptable for Xpert Xpress SARS-CoV-2/FLU/RSV testing.  Fact Sheet for Patients: BloggerCourse.com  Fact Sheet for Healthcare Providers: SeriousBroker.it  This test is not yet approved or cleared by the [place] FDA and has been authorized for detection and/or diagnosis of SARS-CoV-2 by FDA under an Emergency Use Authorization (EUA). This EUA will remain in effect (meaning this test can be used) for the duration of the COVID-19 declaration under Section 564(b)(1) of the Act, 21 U.S.C. section 360bbb-3(b)(1), unless the authorization is terminated or revoked.     Resp Syncytial Virus by PCR NEGATIVE NEGATIVE Final    Comment: (NOTE) Fact Sheet for Patients: BloggerCourse.com  Fact Sheet for Healthcare Providers: SeriousBroker.it  This test is not yet approved or cleared by the [place] FDA and has been authorized for detection and/or diagnosis of SARS-CoV-2 by FDA under an  Emergency Use Authorization (EUA). This EUA will remain in effect (meaning this test can be used) for the duration of the COVID-19 declaration under Section 564(b)(1) of the Act, 21 U.S.C. section 360bbb-3(b)(1), unless the authorization is terminated or revoked.  Performed at [facility], [address], [place], [place] [address]   Blood culture (routine x 2)     Status: None   Collection Time: 02/13/24  2:00 PM   Specimen: BLOOD  Result Value Ref Range Status   Specimen Description   Final    BLOOD BLOOD RIGHT ARM Performed at [facility], [address], [place], [place] [address]    Special Requests   Final    BOTTLES DRAWN AEROBIC AND ANAEROBIC Blood Culture  adequate volume Performed at [facility], [address], [place], [place] [address]    Culture   Final    NO GROWTH 5 DAYS Performed at [facility], [address] [place], [place] [address]    Report Status 02/18/2024 FINAL  Final  Blood culture (routine x 2)     Status: None   Collection Time: 02/13/24  2:01 PM   Specimen: BLOOD LEFT ARM  Result Value Ref Range Status   Specimen Description   Final    BLOOD LEFT ARM Performed at [facility], [address] [place], [place] [address]    Special Requests   Final    BOTTLES DRAWN AEROBIC AND ANAEROBIC Blood Culture adequate volume Performed at [facility], [address] [place], [place] [address]    Culture   Final    NO GROWTH 5 DAYS Performed at [facility], [address] [place], [place] [address]    Report Status 02/18/2024 FINAL  Final     Labs: BNP (last 3 results) No results for input(s): "BNP" in the last 8760 hours. Basic Metabolic Panel: Recent Labs  Lab 02/13/24 1309 02/14/24 0932 02/16/24 0559 02/17/24 0553 02/18/24 0803  NA 132* 137 133* 131* 135  K 3.2* 4.3 3.8 3.9 3.2*  CL 101 106 98 96* 101  CO2 17* 18* 22 21* 23  GLUCOSE 178* 178* 247* 282* 166*  BUN 29* 19 30* 39* 46*  CREATININE 0.61 0.72 0.70 0.71 0.70  CALCIUM 8.6* 8.5* 8.8* 8.7* 8.5*   Liver Function Tests: Recent Labs  Lab 02/13/24 1309 02/14/24 0932  AST 22 17  ALT 7 6  ALKPHOS 72 65  BILITOT 0.6 0.4  PROT 7.9 7.2  ALBUMIN 2.9* 2.6*   No results for input(s): "LIPASE", "AMYLASE" in the last 168 hours. No results for input(s): "AMMONIA" in the last 168 hours. CBC: Recent Labs  Lab 02/13/24 1309 02/16/24 0559  WBC 4.7 3.3*  HGB 11.5* 10.7*  HCT 36.9 35.3*  MCV 87.4 88.7  PLT 277 292   Cardiac Enzymes: No results for input(s): "CKTOTAL", "CKMB", "CKMBINDEX", "TROPONINI" in the last 168 hours. BNP: Invalid input(s): "POCBNP" CBG: Recent Labs  Lab  02/17/24 1150 02/17/24 1633 02/17/24 2135 02/18/24 0751 02/18/24 1120  GLUCAP 272* 109* 442* 167* 156*   D-Dimer No results for input(s): "DDIMER" in the last 72 hours. Hgb A1c Recent Labs    02/16/24 0559  HGBA1C 7.4*   Lipid Profile No results for input(s): "CHOL", "HDL", "LDLCALC", "TRIG", "CHOLHDL", "LDLDIRECT" in the last 72 hours. Thyroid function studies No results for input(s): "TSH", "T4TOTAL", "T3FREE", "THYROIDAB" in the last 72 hours.  Invalid input(s): "FREET3" Anemia work  up No results for input(s): "VITAMINB12", "FOLATE", "FERRITIN", "TIBC", "IRON", "RETICCTPCT" in the last 72 hours. Urinalysis No results found for: "COLORURINE", "APPEARANCEUR", "LABSPEC", "PHURINE", "GLUCOSEU", "HGBUR", "BILIRUBINUR", "KETONESUR", "PROTEINUR", "UROBILINOGEN", "NITRITE", "LEUKOCYTESUR" Sepsis Labs Recent Labs  Lab 02/13/24 1309 02/16/24 0559  WBC 4.7 3.3*   Microbiology Recent Results (from the past 240 hours)  Resp panel by RT-PCR (RSV, Flu A&B, Covid) Anterior Nasal Swab     Status: None   Collection Time: 02/13/24  1:09 PM   Specimen: Anterior Nasal Swab  Result Value Ref Range Status   SARS Coronavirus 2 by RT PCR NEGATIVE NEGATIVE Final    Comment: (NOTE) SARS-CoV-2 target nucleic acids are NOT DETECTED.  The SARS-CoV-2 RNA is generally detectable in upper respiratory specimens during the acute phase of infection. The lowest concentration of SARS-CoV-2 viral copies this assay can detect is 138 copies/mL. A negative result does not preclude SARS-Cov-2 infection and should not be used as the sole basis for treatment or other patient management decisions. A negative result may occur with  improper specimen collection/handling, submission of specimen other than nasopharyngeal swab, presence of viral mutation(s) within the areas targeted by this assay, and inadequate number of viral copies(<138 copies/mL). A negative result must be combined with clinical  observations, patient history, and epidemiological information. The expected result is Negative.  Fact Sheet for Patients:  BloggerCourse.com  Fact Sheet for Healthcare Providers:  SeriousBroker.it  This test is no t yet approved or cleared by the [place] FDA and  has been authorized for detection and/or diagnosis of SARS-CoV-2 by FDA under an Emergency Use Authorization (EUA). This EUA will remain  in effect (meaning this test can be used) for the duration of the COVID-19 declaration under Section 564(b)(1) of the Act, 21 U.S.C.section 360bbb-3(b)(1), unless the authorization is terminated  or revoked sooner.       Influenza A by PCR NEGATIVE NEGATIVE Final   Influenza B by PCR NEGATIVE NEGATIVE Final    Comment: (NOTE) The Xpert Xpress SARS-CoV-2/FLU/RSV plus assay is intended as an aid in the diagnosis of influenza from Nasopharyngeal swab specimens and should not be used as a sole basis for treatment. Nasal washings and aspirates are unacceptable for Xpert Xpress SARS-CoV-2/FLU/RSV testing.  Fact Sheet for Patients: BloggerCourse.com  Fact Sheet for Healthcare Providers: SeriousBroker.it  This test is not yet approved or cleared by the [place] FDA and has been authorized for detection and/or diagnosis of SARS-CoV-2 by FDA under an Emergency Use Authorization (EUA). This EUA will remain in effect (meaning this test can be used) for the duration of the COVID-19 declaration under Section 564(b)(1) of the Act, 21 U.S.C. section 360bbb-3(b)(1), unless the authorization is terminated or revoked.     Resp Syncytial Virus by PCR NEGATIVE NEGATIVE Final    Comment: (NOTE) Fact Sheet for Patients: BloggerCourse.com  Fact Sheet for Healthcare Providers: SeriousBroker.it  This test is not yet approved or cleared by  the [place] FDA and has been authorized for detection and/or diagnosis of SARS-CoV-2 by FDA under an Emergency Use Authorization (EUA). This EUA will remain in effect (meaning this test can be used) for the duration of the COVID-19 declaration under Section 564(b)(1) of the Act, 21 U.S.C. section 360bbb-3(b)(1), unless the authorization is terminated or revoked.  Performed at [facility], [address] [place], [place] [address]   Blood culture (routine x 2)     Status: None   Collection Time: 02/13/24  2:00 PM  Specimen: BLOOD  Result Value Ref Range Status   Specimen Description   Final    BLOOD BLOOD RIGHT ARM Performed at [facility], [address], [place], [place] [address]    Special Requests   Final    BOTTLES DRAWN AEROBIC AND ANAEROBIC Blood Culture adequate volume Performed at [facility], [address] [place], [place] [address]    Culture   Final    NO GROWTH 5 DAYS Performed at [facility], [address] [place], [place] [address]    Report Status 02/18/2024 FINAL  Final  Blood culture (routine x 2)     Status: None   Collection Time: 02/13/24  2:01 PM   Specimen: BLOOD LEFT ARM  Result Value Ref Range Status   Specimen Description   Final    BLOOD LEFT ARM Performed at [facility], [address] [place], [place] [address]    Special Requests   Final    BOTTLES DRAWN AEROBIC AND ANAEROBIC Blood Culture adequate volume Performed at [facility], [address] [place], [place] [address]    Culture   Final    NO GROWTH 5 DAYS Performed at [facility], [address], [place], [place] [address]    Report Status 02/18/2024 FINAL  Final    Please note: You were cared for by a hospitalist during your hospital stay. Once you are discharged, your primary care physician will handle any further medical issues. Please note that NO REFILLS for any  discharge medications will be authorized once you are discharged, as it is imperative that you return to your primary care physician (or establish a relationship with a primary care physician if you do not have one) for your post hospital discharge needs so that they can reassess your need for medications and monitor your lab values.    Time coordinating discharge: 40 minutes  SIGNED:   Burnadette Pop, MD  [facility] 02/18/2024, 11:49 AM Pager [phone]  If 7PM-7AM, please contact night-coverage [email or web address] Password [email or web address]

## 2024-02-18 NOTE — Plan of Care (Signed)

## 2024-02-18 NOTE — TOC Transition Note (Addendum)
 Transition of Care [facility]) - Discharge Note   Patient Details  Name: Lindsey Williamson MRN: [id number] Date of Birth: [birth year]  Transition of [facility]) CM/SW Contact:  Diona Browner, LCSW Phone Number: 02/18/2024, 11:53 AM   Clinical Narrative:    Pt medically ready to d/c home. HHPT setup through [facility] and RW and O2 ordered through [employer] to be delivered to pt room. No further TOC needs.    Final next level of care: Home/Self Care Barriers to Discharge: Barriers Resolved   Patient Goals and CMS Choice Patient states their goals for this hospitalization and ongoing recovery are:: return home CMS Medicare.gov Compare Post Acute Care list provided to:: Patient Choice offered to / list presented to : Patient [place] ownership interest in [facility].provided to:: Patient    Discharge Placement                       Discharge Plan and Services Additional resources added to the After Visit Summary for                  DME Arranged: Walker rolling DME Agency: [employer] Date DME Agency Contacted: 02/18/24 Time DME Agency Contacted: 1152 Representative spoke with at DME Agency: Vaughan Basta HH Arranged: PT HH Agency: [facility] Date [facility] Agency Contacted: 02/18/24 Time HH Agency Contacted: 1152 Representative spoke with at [facility] Agency: Kandee Keen  Social Drivers of Health (SDOH) Interventions SDOH Screenings   Food Insecurity: No Food Insecurity (02/13/2024)  Housing: Low Risk  (02/13/2024)  Transportation Needs: No Transportation Needs (02/13/2024)  Utilities: Not At Risk (02/14/2024)  Financial Resource Strain: Low Risk  (01/25/2024)   Received from [facility]  Physical Activity: Insufficiently Active (01/25/2024)   Received from [facility]  Social Connections: Socially Integrated (02/13/2024)  Stress: Stress Concern Present (01/25/2024)   Received from [facility]  Tobacco Use: Medium Risk (02/13/2024)     Readmission Risk  Interventions    02/18/2024    9:51 AM  Readmission Risk Prevention Plan  Transportation Screening Complete  PCP or Specialist Appt within 3-5 Days Complete  HRI or Home Care Consult Complete  Social Work Consult for Recovery Care Planning/Counseling Complete  Palliative Care Screening Not Applicable  Medication Review Oceanographer) Complete

## 2024-02-18 NOTE — TOC Initial Note (Signed)
 Transition of Care [facility]) - Initial/Assessment Note    Patient Details  Name: Lindsey Williamson MRN: [id number] Date of Birth: 1951-10-17  [facility]) CM/SW Contact:    Diona Browner, LCSW Phone Number: 02/18/2024, 9:52 AM  Clinical Narrative:                 Pt from home w/ spouse. Pt continued medical workup. Pt will need home O2. Pending PT recommendation. TOC to follow for d/c needs.    Barriers to Discharge: Continued Medical Work up   Patient Goals and CMS Choice Patient states their goals for this hospitalization and ongoing recovery are:: return home          Expected Discharge Plan and Services       Living arrangements for the past 2 months: Apartment                                      Prior Living Arrangements/Services Living arrangements for the past 2 months: Apartment Lives with:: Spouse Patient language and need for interpreter reviewed:: Yes Do you feel safe going back to the place where you live?: Yes      Need for Family Participation in Patient Care: Yes (Comment) Care giver support system in place?: Yes (comment)   Criminal Activity/Legal Involvement Pertinent to Current Situation/Hospitalization: No - Comment as needed  Activities of Daily Living   ADL Screening (condition at time of admission) Independently performs ADLs?: Yes (appropriate for developmental age) Is the patient deaf or have difficulty hearing?: No Does the patient have difficulty seeing, even when wearing glasses/contacts?: No Does the patient have difficulty concentrating, remembering, or making decisions?: No  Permission Sought/Granted                  Emotional Assessment Appearance:: Appears stated age     Orientation: : Oriented to Place, Oriented to Self, Oriented to  Time, Oriented to Situation Alcohol / Substance Use: Not Applicable Psych Involvement: No (comment)  Admission diagnosis:  Hypoxia [R09.02] Patient Active Problem  List   Diagnosis Date Noted   Hypoxia 02/13/2024   Encounter for antineoplastic immunotherapy 02/09/2024   Encounter for antineoplastic chemotherapy 11/15/2023   Goals of care, counseling/discussion 10/25/2023   SOB (shortness of breath) 10/18/2023   Angioedema 10/17/2023   Bleeding per rectum 07/30/2022   Diverticular disease of colon 07/30/2022   First degree hemorrhoids 07/30/2022   Hyperglycemia due to type 2 diabetes mellitus (HCC) 07/30/2022   Primary cancer of right lower lobe of lung (HCC) 04/08/2022   Lung nodule 03/22/2022   Dizziness 05/19/2020   Allergic rhinitis with a predominant nonallergic component. 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   Tobacco abuse 12/16/2014   Hyperlipidemia 12/12/2013   Hypertension 12/12/2013   PCP:  [name], MD Pharmacy:   [facility] [phone] [place], [place] - [address] AT [facility] [address] [place] [address] Phone: [phone] Fax: [phone]     Social Drivers of Health (  SDOH) Social History: SDOH Screenings   Food Insecurity: No Food Insecurity (02/13/2024)  Housing: Low Risk  (02/13/2024)  Transportation Needs: No Transportation Needs (02/13/2024)  Utilities: Not At Risk (02/14/2024)  Financial Resource Strain: Low Risk  (01/25/2024)   Received from [facility]  Physical Activity: Insufficiently Active (01/25/2024)   Received from [facility]  Social Connections: Socially Integrated (02/13/2024)  Stress: Stress Concern Present (01/25/2024)   Received from [facility]   Tobacco Use: Medium Risk (02/13/2024)   SDOH Interventions:     Readmission Risk Interventions    02/18/2024    9:51 AM  Readmission Risk Prevention Plan  Transportation Screening Complete  PCP or Specialist Appt within 3-5 Days Complete  HRI or Home Care Consult Complete  Social Work Consult for Recovery Care Planning/Counseling Complete  Palliative Care Screening Not Applicable  Medication Review Oceanographer) Complete

## 2024-02-20 ENCOUNTER — Other Ambulatory Visit (HOSPITAL_COMMUNITY): Payer: Self-pay

## 2024-02-22 ENCOUNTER — Encounter: Payer: Self-pay | Admitting: Internal Medicine

## 2024-03-02 NOTE — Progress Notes (Signed)
  [facility] OFFICE PROGRESS NOTE  Lindsey Mis, MD [address] [place] [address]  DIAGNOSIS: 1) history of stage Ia non-small cell lung cancer, adenocarcinoma the right upper lobe who under went radiation under the care of Dr. Kathrynn Running from 04/23/2022-04/29/22 2 Stage IIIa confirm (T3, N2, M0) non-small cell lung cancer, squamous cell carcinoma. She presented with recurrent tumor at the right lung base, associated right lower lobe metastasis, and right hilar and subcarinal nodal metastases. This was diagnosed in October 2024.   PDL1: 85%   PRIOR THERAPY: 1) Radiation to the right upper lobe under the care of Dr. Kathrynn Running from 04/23/2022-04/29/22  2) Concurrent chemoradiation with carboplatin for an AUC of 2 and paclitaxel 45 mg/m.  Last dose on 12/20/23 Status post 7 cycle.  3) Consolidation immunotherapy with Imfinzi 1500 mg IV every 4 weeks, first dose expected on 02/07/2024. Discontinued after 1 cycle due to concern for pneumonitis  CURRENT THERAPY: Observation   INTERVAL HISTORY: Lindsey Williamson 73 y.o. female returns to the clinic today for a follow-up visit accompanied by her husband.  In summary, the patient completed chemoradiation for her stage III non-small cell lung cancer.  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 she was given doxycycline and a Medrol Dosepak and starting treatment was delayed by another 2 weeks.   She did start treatment on 02/07/2024. A few days later, she started developing a cough and hypoxia. She presented to the emergency room on 02/13/2024 for the chief complaint of shortness of breath. She was admitted until 02/18/24.  Her CT angiogram that was performed on 02/13/2024 was similar compared to prior study with the opacity in the right lung base and right  lower lobe.  However, there was some increasing groundglass opacities throughout the lung, right greater than left which could be edema or infection. She had an echocardiogram performed in the hospital which showed ejection fraction of 65 to 70%. She was admitted for acute hypoxic respiratory failure and treated with bronchodilators, nebulizers, and antibiotics.  She was started on high-dose steroids for possible immune mediated pneumonitis.  She was also treated with Lasix.  Since being discharged, she states she is feeling "a whole lot better" but she did start coughing again yesterday and this morning.  When she was in the hospital she was given Mucinex and Delsym which she states was effective.  She is wondering if this could be sent as a prescription as it may be more affordable than over-the-counter.  She denies any fever, chills, night sweats, or unexplained weight loss.  She is on supplemental oxygen and she is scheduled to see pulmonologist on 03/14/2024.  She denies any hemoptysis or chest pain.  Denies any nausea, vomiting, diarrhea, or constipation.  Denies any headache or visual changes.  Denies any rashes or skin changes.  She has a physical therapist helping her with exercises at home.  She is here today for evaluation and discuss the next steps before considering going with cycle #2.   MEDICAL HISTORY: Past Medical History:  Diagnosis Date   Angioedema 10/17/2023   Arthritis    Diabetes mellitus without complication (HCC)    Hypertension     ALLERGIES:  is allergic to benzonatate, lisinopril, aspirin, azithromycin, and codeine.  MEDICATIONS:  Current Outpatient Medications  Medication Sig  Dispense Refill   amLODipine (NORVASC) 5 MG tablet Take 2.5 mg by mouth in the morning.     aspirin EC 81 MG tablet Take 81 mg by mouth daily. Swallow whole.     azelastine (ASTELIN) 0.1 % nasal spray Place 2 sprays into both nostrils 2 (two) times daily as needed for rhinitis. 30 mL 5    bisoprolol (ZEBETA) 5 MG tablet Take 2.5 mg by mouth daily.     cetirizine (ZYRTEC ALLERGY) 10 MG tablet Take 1 tablet (10 mg total) by mouth daily. 90 tablet 1   diclofenac Sodium (VOLTAREN) 1 % GEL Apply 2 g topically 2 (two) times daily as needed (for pain).     empagliflozin (JARDIANCE) 25 MG TABS tablet Take 25 mg by mouth daily at 6 PM.     ferrous sulfate 325 (65 FE) MG tablet Take 325 mg by mouth daily with breakfast.     glipiZIDE (GLUCOTROL XL) 5 MG 24 hr tablet Take 1 tablet (5 mg total) by mouth daily with breakfast. 30 tablet 0   guaiFENesin (MUCINEX) 600 MG 12 hr tablet Take 1 tablet (600 mg total) by mouth 2 (two) times daily. 60 tablet 0   metFORMIN (GLUCOPHAGE-XR) 500 MG 24 hr tablet Take 1,000 mg by mouth in the morning and at bedtime.     nystatin (MYCOSTATIN/NYSTOP) powder Apply 1 Application topically 3 (three) times daily. (Patient taking differently: Apply 1 Application topically 3 (three) times daily as needed (for irritation- affectefd areas).) 15 g 0   pantoprazole (PROTONIX) 40 MG tablet Take 1 tablet (40 mg total) by mouth daily at 12 noon. 30 tablet 0   polyethylene glycol (MIRALAX / GLYCOLAX) 17 g packet Take 17 g packet by mouth daily. 14 each 0   pravastatin (PRAVACHOL) 80 MG tablet Take 80 mg by mouth at bedtime.     prochlorperazine (COMPAZINE) 10 MG tablet TAKE 1 TABLET(10 MG) BY MOUTH EVERY 6 HOURS AS NEEDED (Patient taking differently: Take 10 mg by mouth every 6 (six) hours as needed for nausea or vomiting.) 30 tablet 2   sucralfate (CARAFATE) 1 g tablet TAKE 1 TABLET(1 GRAM) BY MOUTH FOUR TIMES DAILY(WITH MEALS AND AT BEDTIME) 120 tablet 0   potassium chloride SA (KLOR-CON M) 20 MEQ tablet Take 2 tablets (40 mEq total) by mouth daily for 3 doses. 6 tablet 0   No current facility-administered medications for this visit.    SURGICAL HISTORY:  Past Surgical History:  Procedure Laterality Date   BRONCHIAL BIOPSY  03/30/2022   Procedure: BRONCHIAL BIOPSIES;   Surgeon: Josephine Igo, DO;  Location: [facility] ENDOSCOPY;  Service: Pulmonary;;   BRONCHIAL BIOPSY  10/17/2023   Procedure: BRONCHIAL BIOPSIES;  Surgeon: Leslye Peer, MD;  Location: [facility] ENDOSCOPY;  Service: Pulmonary;;   BRONCHIAL BRUSHINGS  03/30/2022   Procedure: BRONCHIAL BRUSHINGS;  Surgeon: Josephine Igo, DO;  Location: [facility] ENDOSCOPY;  Service: Pulmonary;;   BRONCHIAL BRUSHINGS  10/17/2023   Procedure: BRONCHIAL BRUSHINGS;  Surgeon: Leslye Peer, MD;  Location: [facility] ENDOSCOPY;  Service: Pulmonary;;   BRONCHIAL NEEDLE ASPIRATION BIOPSY  03/30/2022   Procedure: BRONCHIAL NEEDLE ASPIRATION BIOPSIES;  Surgeon: Josephine Igo, DO;  Location: [facility] ENDOSCOPY;  Service: Pulmonary;;   BRONCHIAL NEEDLE ASPIRATION BIOPSY  10/17/2023   Procedure: BRONCHIAL NEEDLE ASPIRATION BIOPSIES;  Surgeon: Leslye Peer, MD;  Location: [facility] ENDOSCOPY;  Service: Pulmonary;;   EYE SURGERY     FIDUCIAL MARKER PLACEMENT  03/30/2022   Procedure: FIDUCIAL MARKER PLACEMENT;  Surgeon: Josephine Igo, DO;  Location: [facility] ENDOSCOPY;  Service: Pulmonary;;   FINE NEEDLE ASPIRATION  10/17/2023   Procedure: FINE NEEDLE ASPIRATION;  Surgeon: Leslye Peer, MD;  Location: [facility] ENDOSCOPY;  Service: Pulmonary;;   FOREIGN BODY REMOVAL  03/30/2022   Procedure: FOREIGN BODY REMOVAL;  Surgeon: Josephine Igo, DO;  Location: [facility] ENDOSCOPY;  Service: Pulmonary;;   HEMOSTASIS CONTROL  10/17/2023   Procedure: HEMOSTASIS CONTROL;  Surgeon: Leslye Peer, MD;  Location: [facility] ENDOSCOPY;  Service: Pulmonary;;   VIDEO BRONCHOSCOPY WITH ENDOBRONCHIAL ULTRASOUND Bilateral 03/30/2022   Procedure: VIDEO BRONCHOSCOPY WITH ENDOBRONCHIAL ULTRASOUND;  Surgeon: Josephine Igo, DO;  Location: [facility] ENDOSCOPY;  Service: Pulmonary;  Laterality: Bilateral;   VIDEO BRONCHOSCOPY WITH ENDOBRONCHIAL ULTRASOUND Right 10/17/2023   Procedure: VIDEO BRONCHOSCOPY WITH ENDOBRONCHIAL ULTRASOUND;  Surgeon: Leslye Peer, MD;  Location: [facility] ENDOSCOPY;  Service: Pulmonary;   Laterality: Right;   VIDEO BRONCHOSCOPY WITH RADIAL ENDOBRONCHIAL ULTRASOUND  03/30/2022   Procedure: VIDEO BRONCHOSCOPY WITH RADIAL ENDOBRONCHIAL ULTRASOUND;  Surgeon: Josephine Igo, DO;  Location: [facility] ENDOSCOPY;  Service: Pulmonary;;    REVIEW OF SYSTEMS:   Review of Systems  Constitutional: Improving weakness/fatigue. Negative for appetite change, chills, fever and unexpected weight change.  HENT: Negative for mouth sores, nosebleeds, sore throat and trouble swallowing.   Eyes: Negative for eye problems and icterus.  Respiratory: Positive for dyspnea and cough. Negative for hemoptysis and wheezing.   Cardiovascular: Negative for chest pain and leg swelling.  Gastrointestinal: Negative for abdominal pain, constipation, diarrhea, nausea and vomiting.  Genitourinary: Negative for bladder incontinence, difficulty urinating, dysuria, frequency and hematuria.   Musculoskeletal: Negative for back pain, gait problem, neck pain and neck stiffness.  Skin: Negative for itching and rash.  Neurological: Negative for dizziness, extremity weakness, gait problem, headaches, light-headedness and seizures.  Hematological: Negative for adenopathy. Does not bruise/bleed easily.  Psychiatric/Behavioral: Negative for confusion, depression and sleep disturbance. The patient is not nervous/anxious.     PHYSICAL EXAMINATION:  Blood pressure 135/73, pulse 93, temperature 97.7 F (36.5 C), temperature source Temporal, resp. rate 16, height 5\' 4"  (1.626 m), weight 146 lb 12.8 oz (66.6 kg), SpO2 97%.  ECOG PERFORMANCE STATUS: 1  Physical Exam  Constitutional: Oriented to person, place, and time and thin appearing female, and in no distress.  HENT:  Head: Normocephalic and atraumatic.  Mouth/Throat: Oropharynx is clear and moist. No oropharyngeal exudate.  Eyes: Conjunctivae are normal. Right eye exhibits no discharge. Left eye exhibits no discharge. No scleral icterus.  Neck: Normal range of motion. Neck  supple.  Cardiovascular: Normal rate, regular rhythm, normal heart sounds and intact distal pulses.   Pulmonary/Chest: Effort normal and breath sounds normal. No respiratory distress. No wheezes. No rales. On supplemental oxygen  Abdominal: Soft. Bowel sounds are normal. Exhibits no distension and no mass. There is no tenderness.  Musculoskeletal: Normal range of motion. Exhibits no edema.  Lymphadenopathy:    No cervical adenopathy.  Neurological: Alert and oriented to person, place, and time. Exhibits normal muscle tone. Examined in the wheelchair.  Skin: Skin is warm and dry. No rash noted. Not diaphoretic. No erythema. No pallor.  Psychiatric: Mood, memory and judgment normal.  Vitals reviewed.  LABORATORY DATA: Lab Results  Component Value Date   WBC 5.6 03/07/2024   HGB 10.6 (L) 03/07/2024   HCT 33.6 (L) 03/07/2024   MCV 86.8 03/07/2024   PLT 236 03/07/2024      Chemistry      Component Value Date/Time  NA 135 02/18/2024 0803   K 3.2 (L) 02/18/2024 0803   CL 101 02/18/2024 0803   CO2 23 02/18/2024 0803   BUN 46 (H) 02/18/2024 0803   CREATININE 0.70 02/18/2024 0803   CREATININE 0.64 02/09/2024 0921      Component Value Date/Time   CALCIUM 8.5 (L) 02/18/2024 0803   ALKPHOS 65 02/14/2024 0932   AST 17 02/14/2024 0932   AST 15 02/09/2024 0921   ALT 6 02/14/2024 0932   ALT <5 02/09/2024 0921   BILITOT 0.4 02/14/2024 0932   BILITOT 0.4 02/09/2024 0921       RADIOGRAPHIC STUDIES:  ECHOCARDIOGRAM COMPLETE Result Date: 02/15/2024    ECHOCARDIOGRAM REPORT   Patient Name:   Lindsey Williamson Date of Exam: 02/15/2024 Medical Rec #:  [id number]          Height:       64.0 in Accession #:    [id number]         Weight:       146.2 lb Date of Birth:  07-Aug-1951          BSA:          1.712 m Patient Age:    72 years           BP:           152/84 mmHg Patient Gender: F                  HR:           103 bpm. Exam Location:  Inpatient Procedure: 2D Echo, Color Doppler and  Cardiac Doppler (Both Spectral and Color            Flow Doppler were utilized during procedure). Indications:    CHF  History:        Patient has no prior history of Echocardiogram examinations.                 Risk Factors:Diabetes and Hypertension.  Sonographer:    Harriette Bouillon RDCS Referring Phys: [phone] AMRIT ADHIKARI IMPRESSIONS  1. Left ventricular ejection fraction, by estimation, is 65 to 70%. The left ventricle has normal function. The left ventricle has no regional wall motion abnormalities. There is mild left ventricular hypertrophy. Left ventricular diastolic parameters are indeterminate.  2. Right ventricular systolic function is normal. The right ventricular size is normal. There is mildly elevated pulmonary artery systolic pressure. The estimated right ventricular systolic pressure is 38.5 mmHg.  3. The mitral valve is degenerative. No evidence of mitral valve regurgitation. No evidence of mitral stenosis.  4. The aortic valve is tricuspid. There is mild calcification of the aortic valve. Aortic valve regurgitation is not visualized. Aortic valve sclerosis is present, with no evidence of aortic valve stenosis.  5. The inferior vena cava is normal in size with greater than 50% respiratory variability, suggesting right atrial pressure of 3 mmHg. FINDINGS  Left Ventricle: Left ventricular ejection fraction, by estimation, is 65 to 70%. The left ventricle has normal function. The left ventricle has no regional wall motion abnormalities. Strain imaging was not performed. The left ventricular internal cavity  size was normal in size. There is mild left ventricular hypertrophy. Left ventricular diastolic parameters are indeterminate. Right Ventricle: The right ventricular size is normal. No increase in right ventricular wall thickness. Right ventricular systolic function is normal. There is mildly elevated pulmonary artery systolic pressure. The tricuspid regurgitant velocity is 2.98  m/s, and with an  assumed right  atrial pressure of 3 mmHg, the estimated right ventricular systolic pressure is 38.5 mmHg. Left Atrium: Left atrial size was normal in size. Right Atrium: Right atrial size was normal in size. Pericardium: There is no evidence of pericardial effusion. Mitral Valve: The mitral valve is degenerative in appearance. There is mild thickening of the mitral valve leaflet(s). There is mild calcification of the mitral valve leaflet(s). Mild mitral annular calcification. No evidence of mitral valve regurgitation. No evidence of mitral valve stenosis. Tricuspid Valve: The tricuspid valve is normal in structure. Tricuspid valve regurgitation is mild . No evidence of tricuspid stenosis. Aortic Valve: The aortic valve is tricuspid. There is mild calcification of the aortic valve. Aortic valve regurgitation is not visualized. Aortic valve sclerosis is present, with no evidence of aortic valve stenosis. Pulmonic Valve: The pulmonic valve was normal in structure. Pulmonic valve regurgitation is not visualized. No evidence of pulmonic stenosis. Aorta: The aortic root is normal in size and structure. Venous: The inferior vena cava is normal in size with greater than 50% respiratory variability, suggesting right atrial pressure of 3 mmHg. IAS/Shunts: No atrial level shunt detected by color flow Doppler. Additional Comments: 3D imaging was not performed.  LEFT VENTRICLE PLAX 2D LVIDd:         3.40 cm LVIDs:         2.35 cm LV PW:         1.20 cm LV IVS:        1.20 cm LVOT diam:     2.00 cm LV SV:         44 LV SV Index:   26 LVOT Area:     3.14 cm  IVC IVC diam: 1.60 cm LEFT ATRIUM             Index LA diam:        2.50 cm 1.46 cm/m LA Vol (A2C):   35.3 ml 20.62 ml/m LA Vol (A4C):   28.1 ml 16.41 ml/m LA Biplane Vol: 32.1 ml 18.75 ml/m  AORTIC VALVE LVOT Vmax:   93.20 cm/s LVOT Vmean:  57.900 cm/s LVOT VTI:    0.139 m  AORTA Ao Root diam: 2.70 cm Ao Asc diam:  2.90 cm TRICUSPID VALVE TR Peak grad:   35.5 mmHg TR  Vmax:        298.00 cm/s  SHUNTS Systemic VTI:  0.14 m Systemic Diam: 2.00 cm Donato Schultz MD Electronically signed by Donato Schultz MD Signature Date/Time: 02/15/2024/3:18:43 PM    Final    DG CHEST PORT 1 VIEW Result Date: 02/15/2024 CLINICAL DATA:  Shortness of breath. EXAM: PORTABLE CHEST 1 VIEW COMPARISON:  Chest radiograph and CT dated 02/13/2024 FINDINGS: Similar diffuse interstitial coarsening and bronchitic changes. There is shallow inspiration. No consolidative changes. There is no pleural effusion pneumothorax. Stable cardiac silhouette. Atherosclerotic calcification of the aorta. No acute osseous pathology. IMPRESSION: No interval change. Electronically Signed   By: Elgie Collard M.D.   On: 02/15/2024 11:53   CT Angio Chest PE W and/or Wo Contrast Result Date: 02/13/2024 CLINICAL DATA:  Pulmonary embolism (PE) suspected, high prob. Hypoxia. Shortness of breath. EXAM: CT ANGIOGRAPHY CHEST WITH CONTRAST TECHNIQUE: Multidetector CT imaging of the chest was performed using the standard protocol during bolus administration of intravenous contrast. Multiplanar CT image reconstructions and MIPs were obtained to evaluate the vascular anatomy. RADIATION DOSE REDUCTION: This exam was performed according to the departmental dose-optimization program which includes automated exposure control, adjustment of the mA and/or kV according  to patient size and/or use of iterative reconstruction technique. CONTRAST:  75mL OMNIPAQUE IOHEXOL 350 MG/ML SOLN COMPARISON:  01/13/2024 FINDINGS: Cardiovascular: No filling defects in the pulmonary arteries to suggest pulmonary emboli. Heart is borderline in size. Three-vessel coronary artery disease and aortic atherosclerosis. No evidence of aortic aneurysm. Mediastinum/Nodes: Mediastinal and right hilar adenopathy again noted, similar to recent study. Trachea and esophagus are unremarkable. Thyroid unremarkable. Lungs/Pleura: Persistent right lower lobe/basilar airspace  opacity, stable since prior study. Increasing ground-glass airspace opacities throughout the lungs bilaterally, right greater than left. Moderate centrilobular and paraseptal emphysema. No effusions. Upper Abdomen: No acute findings Musculoskeletal: Chest wall soft tissues are unremarkable. No acute bony abnormality. Review of the MIP images confirms the above findings. IMPRESSION: 1. No evidence of pulmonary embolus. 2. Three-vessel coronary artery disease.  Borderline cardiomegaly. 3. Mediastinal and right hilar adenopathy unchanged. Number for airspace opacity at the right lung base in the right lower lobe unchanged since recent study. 4. Increasing ground-glass opacities throughout the lungs, right greater than left. This could reflect edema or infection. Aortic Atherosclerosis (ICD10-I70.0) and Emphysema (ICD10-J43.9). Electronically Signed   By: Charlett Nose M.D.   On: 02/13/2024 17:33   DG Chest 2 View Result Date: 02/13/2024 CLINICAL DATA:  lung cancer, SOB, cough, hypoxia. EXAM: CHEST - 2 VIEW COMPARISON:  CT scan chest from 01/13/2024. FINDINGS: There are diffuse reticulonodular opacities throughout bilateral lungs with lower lobe predominance, similar to the prior study and compatible with patient's known history of interstitial lung disease. Bilateral lung fields are otherwise clear. No dense consolidation or lung collapse. Bilateral costophrenic angles are clear. Normal cardio-mediastinal silhouette. No acute osseous abnormalities. The soft tissues are within normal limits. IMPRESSION: No active cardiopulmonary disease. Redemonstration of chronic interstitial lung disease. Electronically Signed   By: Jules Schick M.D.   On: 02/13/2024 15:23     ASSESSMENT/PLAN:  1) history of stage Ia non-small cell lung cancer, adenocarcinoma the right upper lobe who under went radiation under the care of Dr. Kathrynn Running from 04/23/2022-04/29/22 2 Stage IIIa confirm (T3, N2, M0) non-small cell lung cancer, squamous  cell carcinoma. She presented with recurrent tumor at the right lung base, associated right lower lobe metastasis, and right hilar and subcarinal nodal metastases. This was diagnosed in October 2024.    PDL1 expression 85%.    The patient is scheduled to undergo consolidation immunotherapy with Imfinzi 1500 mg IV every 4 weeks . First dose on 02/07/24.   Patient was recently hospitalized which could be secondary to radiation fibrosis although Dr. Arbutus Ped recommended high-dose prednisone taper should there be an immunotherapy mediated component.  She was treated with Lasix, antibiotics, bronchodilators, and nebulizer.   She completed her prednisone but she states she has a few tablets left at home.  She was seen with Dr. Arbutus Ped today.  Dr. Arbutus Ped had lengthy discussion with the patient today about her current condition and recommended treatment options.  Dr. Arbutus Ped we reviewed her images from the hospital and is concerned that the immunotherapy exacerbated inflammation.  Therefore Dr. Arbutus Ped would recommend discontinuing her immunotherapy.  I have removed her care plan.  Instead, she will continue on observation with a repeat CT scan of the chest in 3 months.  We will see her back 1 week after her scan to review the results in the office.  I sent her prescription of Mucinex per patient request.  Will continue taking Delsym for cough.  Dr. Arbutus Ped encouraged her to complete the last few tablets of prednisone  that she has at home.  The patient was advised to call immediately if she has any concerning symptoms in the interval. The patient voices understanding of current disease status and treatment options and is in agreement with the current care plan. All questions were answered. The patient knows to call the clinic with any problems, questions or concerns. We can certainly see the patient much sooner if necessary         Orders Placed This Encounter  Procedures   CT Chest W Contrast     Standing Status:   Future    Expected Date:   06/07/2024    Expiration Date:   03/07/2025    If indicated for the ordered procedure, I authorize the administration of contrast media per Radiology protocol:   Yes    Does the patient have a contrast media/X-ray dye allergy?:   No    Preferred imaging location?:   [facility]   CBC with Differential (Cancer Center Only)    Standing Status:   Future    Expected Date:   06/07/2024    Expiration Date:   03/07/2025   CMP (Cancer Center only)    Standing Status:   Future    Expected Date:   06/07/2024    Expiration Date:   03/07/2025      Jerrold Haskell L Kai Railsback, PA-C 03/07/24  ADDENDUM: Hematology/Oncology Attending: I had a face-to-face encounter with the patient today.  I reviewed her lab, scan and recommended her care plan.  This is a very pleasant 73 years old African-American female with stage IIIa non-small cell lung cancer, squamous cell carcinoma diagnosed in October 2024 with PD-L1 expression of 85%.  The patient underwent a course of concurrent chemoradiation with weekly carboplatin and paclitaxel for 7 cycles with partial response.  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  She started the first cycle of consolidation treatment with Imfinzi 1500 Mg IV every 4 weeks on November 06, 2024.  The patient experienced significant shortness of breath after her treatment and she was admitted to the hospital at that time.  She had CT angiogram of the chest on February 13, 2024 and that showed no evidence for pulmonary embolism but there was unchanged mediastinal and right hilar adenopathy with increasing groundglass opacities throughout the lungs right greater than left again suspicious for edema or infection but this is likely a residual radiation in addition to immunotherapy pneumonitis. The patient is feeling much better and she has been on a tapered dose of prednisone the last few weeks. I had a lengthy  discussion with the patient and her husband about her current condition. I recommended for her to discontinue her treatment with immunotherapy because of the significant pneumonitis she had recently. We will see her back for follow-up visit in 3 months with repeat CT scan of the chest for restaging of her disease. The patient was advised to call immediately if she has any other concerning symptoms in the interval. The total time spent in the appointment was 30 minutes. Disclaimer: This note was dictated with voice recognition software. Similar sounding words can inadvertently be transcribed and may be missed upon review. Lajuana Matte, MD

## 2024-03-06 ENCOUNTER — Other Ambulatory Visit: Payer: Self-pay

## 2024-03-07 ENCOUNTER — Inpatient Hospital Stay: Payer: Medicare Other | Admitting: Dietician

## 2024-03-07 ENCOUNTER — Inpatient Hospital Stay: Payer: Medicare Other | Attending: Radiation Oncology

## 2024-03-07 ENCOUNTER — Inpatient Hospital Stay (HOSPITAL_BASED_OUTPATIENT_CLINIC_OR_DEPARTMENT_OTHER): Payer: Medicare Other | Admitting: Physician Assistant

## 2024-03-07 ENCOUNTER — Inpatient Hospital Stay: Payer: Medicare Other

## 2024-03-07 VITALS — BP 135/73 | HR 93 | Temp 97.7°F | Resp 16 | Ht 64.0 in | Wt 146.8 lb

## 2024-03-07 DIAGNOSIS — C7801 Secondary malignant neoplasm of right lung: Secondary | ICD-10-CM | POA: Insufficient documentation

## 2024-03-07 DIAGNOSIS — C3431 Malignant neoplasm of lower lobe, right bronchus or lung: Secondary | ICD-10-CM

## 2024-03-07 DIAGNOSIS — R059 Cough, unspecified: Secondary | ICD-10-CM

## 2024-03-07 DIAGNOSIS — C3411 Malignant neoplasm of upper lobe, right bronchus or lung: Secondary | ICD-10-CM | POA: Diagnosis present

## 2024-03-07 DIAGNOSIS — Z923 Personal history of irradiation: Secondary | ICD-10-CM | POA: Insufficient documentation

## 2024-03-07 DIAGNOSIS — J849 Interstitial pulmonary disease, unspecified: Secondary | ICD-10-CM | POA: Diagnosis not present

## 2024-03-07 LAB — CMP (CANCER CENTER ONLY)
ALT: <5 U/L (ref 0–44)
AST: 13 U/L — ABNORMAL LOW (ref 15–41)
Albumin: 3.4 g/dL — ABNORMAL LOW (ref 3.5–5.0)
Alkaline Phosphatase: 69 U/L (ref 38–126)
Anion gap: 8 (ref 5–15)
BUN: 13 mg/dL (ref 8–23)
CO2: 23 mmol/L (ref 22–32)
Calcium: 8.7 mg/dL — ABNORMAL LOW (ref 8.9–10.3)
Chloride: 104 mmol/L (ref 98–111)
Creatinine: 0.64 mg/dL (ref 0.44–1.00)
GFR, Estimated: >60 mL/min (ref >60)
Glucose, Bld: 194 mg/dL — ABNORMAL HIGH (ref 70–99)
Potassium: 3.9 mmol/L (ref 3.5–5.1)
Sodium: 135 mmol/L (ref 135–145)
Total Bilirubin: 0.3 mg/dL (ref 0.0–1.2)
Total Protein: 7.1 g/dL (ref 6.5–8.1)

## 2024-03-07 LAB — CBC WITH DIFFERENTIAL (CANCER CENTER ONLY)
Abs Immature Granulocytes: 0.02 10*3/uL (ref 0.00–0.07)
Basophils Absolute: 0 10*3/uL (ref 0.0–0.1)
Basophils Relative: 1 %
Eosinophils Absolute: 0.2 10*3/uL (ref 0.0–0.5)
Eosinophils Relative: 4 %
HCT: 33.6 % — ABNORMAL LOW (ref 36.0–46.0)
Hemoglobin: 10.6 g/dL — ABNORMAL LOW (ref 12.0–15.0)
Immature Granulocytes: 0 %
Lymphocytes Relative: 10 %
Lymphs Abs: 0.6 10*3/uL — ABNORMAL LOW (ref 0.7–4.0)
MCH: 27.4 pg (ref 26.0–34.0)
MCHC: 31.5 g/dL (ref 30.0–36.0)
MCV: 86.8 fL (ref 80.0–100.0)
Monocytes Absolute: 0.7 10*3/uL (ref 0.1–1.0)
Monocytes Relative: 13 %
Neutro Abs: 4 10*3/uL (ref 1.7–7.7)
Neutrophils Relative %: 72 %
Platelet Count: 236 10*3/uL (ref 150–400)
RBC: 3.87 MIL/uL (ref 3.87–5.11)
RDW: 17.2 % — ABNORMAL HIGH (ref 11.5–15.5)
WBC Count: 5.6 10*3/uL (ref 4.0–10.5)
nRBC: 0 % (ref 0.0–0.2)

## 2024-03-07 MED ORDER — GUAIFENESIN ER 600 MG PO TB12: 600.0000 mg | ORAL_TABLET | Freq: Two times a day (BID) | ORAL | 0 refills | Status: DC

## 2024-03-08 ENCOUNTER — Other Ambulatory Visit: Payer: Self-pay

## 2024-03-08 ENCOUNTER — Encounter: Payer: Self-pay | Admitting: Internal Medicine

## 2024-03-08 MED ORDER — CETIRIZINE HCL 10 MG PO TABS: 10.0000 mg | ORAL_TABLET | Freq: Every day | ORAL | 1 refills | Status: DC

## 2024-03-08 MED ORDER — AZELASTINE HCL 0.1 % NA SOLN: 2.0000 | Freq: Two times a day (BID) | NASAL | 5 refills | Status: DC | PRN

## 2024-03-08 MED ORDER — FLUTICASONE PROPIONATE 50 MCG/ACT NA SUSP: 1.0000 | Freq: Every day | NASAL | 3 refills | Status: DC

## 2024-03-14 ENCOUNTER — Ambulatory Visit: Payer: Medicare Other | Admitting: Acute Care

## 2024-03-14 ENCOUNTER — Encounter: Payer: Self-pay | Admitting: Acute Care

## 2024-03-14 ENCOUNTER — Ambulatory Visit (INDEPENDENT_AMBULATORY_CARE_PROVIDER_SITE_OTHER)

## 2024-03-14 VITALS — BP 118/76 | HR 92 | Ht 64.0 in | Wt 144.6 lb

## 2024-03-14 DIAGNOSIS — Z9221 Personal history of antineoplastic chemotherapy: Secondary | ICD-10-CM | POA: Diagnosis not present

## 2024-03-14 DIAGNOSIS — C3490 Malignant neoplasm of unspecified part of unspecified bronchus or lung: Secondary | ICD-10-CM

## 2024-03-14 DIAGNOSIS — J984 Other disorders of lung: Secondary | ICD-10-CM | POA: Diagnosis not present

## 2024-03-14 DIAGNOSIS — Z923 Personal history of irradiation: Secondary | ICD-10-CM | POA: Diagnosis not present

## 2024-03-14 DIAGNOSIS — C3411 Malignant neoplasm of upper lobe, right bronchus or lung: Secondary | ICD-10-CM | POA: Diagnosis not present

## 2024-03-14 NOTE — Progress Notes (Addendum)
 History of Present Illness Lindsey Williamson is a 73 y.o. female  with history of stage Ia non-small cell lung cancer, adenocarcinoma the right upper lobe who under went radiation under the care of Dr. Kathrynn Running from 04/23/2022-04/29/22 2 Stage IIIa confirm (T3, N2, M0) non-small cell lung cancer, squamous cell carcinoma. She presented with recurrent tumor at the right lung base, associated right lower lobe metastasis, and right hilar and subcarinal nodal metastases. This was diagnosed in October 2024.  Cancer Treatment PRIOR THERAPY: 1) Radiation to the right upper lobe under the care of Dr. Kathrynn Running from 04/23/2022-04/29/22  2) Concurrent chemoradiation with carboplatin for an AUC of 2 and paclitaxel 45 mg/m.  Last dose on 12/20/23 Status post 7 cycle.  3) Consolidation immunotherapy with Imfinzi 1500 mg IV every 4 weeks, first dose expected on 02/07/2024. Discontinued after 1 cycle due to concern for pneumonitis   CURRENT THERAPY: Observation    03/14/2024 Pt. Presents for hospital follow up after admission for pneumonia and inflammatory changes possibly related to immune therapy 02/13/2024- 02/18/2024.  after treatment with immune therapy. She was treated with bronchodilators, nebulizing treatment, lasix and antibiotics . She states she does feel better than she did while in the hospital. But she does still have an am  cough, with clear secretions . However , when she coughs, she notices her HR gets up above 100. She is wearing nasal oxygen at 2 L. Sats are 95%. She does have home PT once weekly x 6 weeks. , and she tolerates that well. She has allergies, and post nasal drip that I think is making her cough worse. She just picked up a prescription for Zyrtec, which she will start today.  She states she has not been wheezing. Patient has just finished her last dose of prednisone . She actually looks good today. She is speaking in full sentences and managing well on her oxygen. I have asked her to call if  she feels she needs additional prednisone after she completed her discharge dose. She does complain of fatigue. We discussed that for each day in the hospital it takes about 4 days to recover. We will re-evaluate her in 2 weeks.   Test Results: CXR 03/14/2024 Continued coarse diffuse interstitial densities are noted bilaterally concerning for atypical pneumonia, most prominently seen in right perihilar and basilar regions.  Addendum 03/14/2024 >> Pt is afebrile, secretions are clear, last WBC was 5.3. She is already using Mucinex. Will order Flutter valve to use for mucus clearance . I have called the patient . She understands I have ordered a flutter valve. I told her to use it 4 times a day, 4-6 blows each time. I have told her to call me if she has any change in her condition. She has agreed to call for changes, or any clinical worsening.     Latest Ref Rng & Units 03/07/2024    8:19 AM 02/16/2024    5:59 AM 02/13/2024    1:09 PM  CBC  WBC 4.0 - 10.5 K/uL 5.6  3.3  4.7   Hemoglobin 12.0 - 15.0 g/dL 62.9  52.8  41.3   Hematocrit 36.0 - 46.0 % 33.6  35.3  36.9   Platelets 150 - 400 K/uL 236  292  277        Latest Ref Rng & Units 03/07/2024    8:19 AM 02/18/2024    8:03 AM 02/17/2024    5:53 AM  BMP  Glucose 70 - 99 mg/dL 244  166  282   BUN 8 - 23 mg/dL 13  46  39   Creatinine 0.44 - 1.00 mg/dL 1.61  0.96  0.45   Sodium 135 - 145 mmol/L 135  135  131   Potassium 3.5 - 5.1 mmol/L 3.9  3.2  3.9   Chloride 98 - 111 mmol/L 104  101  96   CO2 22 - 32 mmol/L 23  23  21    Calcium 8.9 - 10.3 mg/dL 8.7  8.5  8.7     BNP No results found for: "BNP"  ProBNP No results found for: "PROBNP"  PFT No results found for: "FEV1PRE", "FEV1POST", "FVCPRE", "FVCPOST", "TLC", "DLCOUNC", "PREFEV1FVCRT", "PSTFEV1FVCRT"  ECHOCARDIOGRAM COMPLETE Result Date: 02/15/2024    ECHOCARDIOGRAM REPORT   Patient Name:   Lindsey Williamson Date of Exam: 02/15/2024 Medical Rec #:  [id number]          Height:        64.0 in Accession #:    [id number]         Weight:       146.2 lb Date of Birth:  [birth year]          BSA:          1.712 m Patient Age:    72 years           BP:           152/84 mmHg Patient Gender: F                  HR:           103 bpm. Exam Location:  Inpatient Procedure: 2D Echo, Color Doppler and Cardiac Doppler (Both Spectral and Color            Flow Doppler were utilized during procedure). Indications:    CHF  History:        Patient has no prior history of Echocardiogram examinations.                 Risk Factors:Diabetes and Hypertension.  Sonographer:    Harriette Bouillon RDCS Referring Phys: [phone] AMRIT ADHIKARI IMPRESSIONS  1. Left ventricular ejection fraction, by estimation, is 65 to 70%. The left ventricle has normal function. The left ventricle has no regional wall motion abnormalities. There is mild left ventricular hypertrophy. Left ventricular diastolic parameters are indeterminate.  2. Right ventricular systolic function is normal. The right ventricular size is normal. There is mildly elevated pulmonary artery systolic pressure. The estimated right ventricular systolic pressure is 38.5 mmHg.  3. The mitral valve is degenerative. No evidence of mitral valve regurgitation. No evidence of mitral stenosis.  4. The aortic valve is tricuspid. There is mild calcification of the aortic valve. Aortic valve regurgitation is not visualized. Aortic valve sclerosis is present, with no evidence of aortic valve stenosis.  5. The inferior vena cava is normal in size with greater than 50% respiratory variability, suggesting right atrial pressure of 3 mmHg. FINDINGS  Left Ventricle: Left ventricular ejection fraction, by estimation, is 65 to 70%. The left ventricle has normal function. The left ventricle has no regional wall motion abnormalities. Strain imaging was not performed. The left ventricular internal cavity  size was normal in size. There is mild left ventricular hypertrophy. Left ventricular  diastolic parameters are indeterminate. Right Ventricle: The right ventricular size is normal. No increase in right ventricular wall thickness. Right ventricular systolic function is normal. There is mildly elevated pulmonary artery systolic pressure. The tricuspid  regurgitant velocity is 2.98  m/s, and with an assumed right atrial pressure of 3 mmHg, the estimated right ventricular systolic pressure is 38.5 mmHg. Left Atrium: Left atrial size was normal in size. Right Atrium: Right atrial size was normal in size. Pericardium: There is no evidence of pericardial effusion. Mitral Valve: The mitral valve is degenerative in appearance. There is mild thickening of the mitral valve leaflet(s). There is mild calcification of the mitral valve leaflet(s). Mild mitral annular calcification. No evidence of mitral valve regurgitation. No evidence of mitral valve stenosis. Tricuspid Valve: The tricuspid valve is normal in structure. Tricuspid valve regurgitation is mild . No evidence of tricuspid stenosis. Aortic Valve: The aortic valve is tricuspid. There is mild calcification of the aortic valve. Aortic valve regurgitation is not visualized. Aortic valve sclerosis is present, with no evidence of aortic valve stenosis. Pulmonic Valve: The pulmonic valve was normal in structure. Pulmonic valve regurgitation is not visualized. No evidence of pulmonic stenosis. Aorta: The aortic root is normal in size and structure. Venous: The inferior vena cava is normal in size with greater than 50% respiratory variability, suggesting right atrial pressure of 3 mmHg. IAS/Shunts: No atrial level shunt detected by color flow Doppler. Additional Comments: 3D imaging was not performed.  LEFT VENTRICLE PLAX 2D LVIDd:         3.40 cm LVIDs:         2.35 cm LV PW:         1.20 cm LV IVS:        1.20 cm LVOT diam:     2.00 cm LV SV:         44 LV SV Index:   26 LVOT Area:     3.14 cm  IVC IVC diam: 1.60 cm LEFT ATRIUM             Index LA diam:         2.50 cm 1.46 cm/m LA Vol (A2C):   35.3 ml 20.62 ml/m LA Vol (A4C):   28.1 ml 16.41 ml/m LA Biplane Vol: 32.1 ml 18.75 ml/m  AORTIC VALVE LVOT Vmax:   93.20 cm/s LVOT Vmean:  57.900 cm/s LVOT VTI:    0.139 m  AORTA Ao Root diam: 2.70 cm Ao Asc diam:  2.90 cm TRICUSPID VALVE TR Peak grad:   35.5 mmHg TR Vmax:        298.00 cm/s  SHUNTS Systemic VTI:  0.14 m Systemic Diam: 2.00 cm Donato Schultz MD Electronically signed by Donato Schultz MD Signature Date/Time: 02/15/2024/3:18:43 PM    Final    DG CHEST PORT 1 VIEW Result Date: 02/15/2024 CLINICAL DATA:  Shortness of breath. EXAM: PORTABLE CHEST 1 VIEW COMPARISON:  Chest radiograph and CT dated 02/13/2024 FINDINGS: Similar diffuse interstitial coarsening and bronchitic changes. There is shallow inspiration. No consolidative changes. There is no pleural effusion pneumothorax. Stable cardiac silhouette. Atherosclerotic calcification of the aorta. No acute osseous pathology. IMPRESSION: No interval change. Electronically Signed   By: Elgie Collard M.D.   On: 02/15/2024 11:53   CT Angio Chest PE W and/or Wo Contrast Result Date: 02/13/2024 CLINICAL DATA:  Pulmonary embolism (PE) suspected, high prob. Hypoxia. Shortness of breath. EXAM: CT ANGIOGRAPHY CHEST WITH CONTRAST TECHNIQUE: Multidetector CT imaging of the chest was performed using the standard protocol during bolus administration of intravenous contrast. Multiplanar CT image reconstructions and MIPs were obtained to evaluate the vascular anatomy. RADIATION DOSE REDUCTION: This exam was performed according to the departmental dose-optimization program which  includes automated exposure control, adjustment of the mA and/or kV according to patient size and/or use of iterative reconstruction technique. CONTRAST:  75mL OMNIPAQUE IOHEXOL 350 MG/ML SOLN COMPARISON:  01/13/2024 FINDINGS: Cardiovascular: No filling defects in the pulmonary arteries to suggest pulmonary emboli. Heart is borderline in size. Three-vessel  coronary artery disease and aortic atherosclerosis. No evidence of aortic aneurysm. Mediastinum/Nodes: Mediastinal and right hilar adenopathy again noted, similar to recent study. Trachea and esophagus are unremarkable. Thyroid unremarkable. Lungs/Pleura: Persistent right lower lobe/basilar airspace opacity, stable since prior study. Increasing ground-glass airspace opacities throughout the lungs bilaterally, right greater than left. Moderate centrilobular and paraseptal emphysema. No effusions. Upper Abdomen: No acute findings Musculoskeletal: Chest wall soft tissues are unremarkable. No acute bony abnormality. Review of the MIP images confirms the above findings. IMPRESSION: 1. No evidence of pulmonary embolus. 2. Three-vessel coronary artery disease.  Borderline cardiomegaly. 3. Mediastinal and right hilar adenopathy unchanged. Number for airspace opacity at the right lung base in the right lower lobe unchanged since recent study. 4. Increasing ground-glass opacities throughout the lungs, right greater than left. This could reflect edema or infection. Aortic Atherosclerosis (ICD10-I70.0) and Emphysema (ICD10-J43.9). Electronically Signed   By: Charlett Nose M.D.   On: 02/13/2024 17:33   DG Chest 2 View Result Date: 02/13/2024 CLINICAL DATA:  lung cancer, SOB, cough, hypoxia. EXAM: CHEST - 2 VIEW COMPARISON:  CT scan chest from 01/13/2024. FINDINGS: There are diffuse reticulonodular opacities throughout bilateral lungs with lower lobe predominance, similar to the prior study and compatible with patient's known history of interstitial lung disease. Bilateral lung fields are otherwise clear. No dense consolidation or lung collapse. Bilateral costophrenic angles are clear. Normal cardio-mediastinal silhouette. No acute osseous abnormalities. The soft tissues are within normal limits. IMPRESSION: No active cardiopulmonary disease. Redemonstration of chronic interstitial lung disease. Electronically Signed   By:  Jules Schick M.D.   On: 02/13/2024 15:23     Past medical hx Past Medical History:  Diagnosis Date   Angioedema 10/17/2023   Arthritis    Diabetes mellitus without complication (HCC)    Hypertension      Social History   Tobacco Use   Smoking status: Former    Current packs/day: 0.00    Types: Cigarettes    Quit date: 2024    Years since quitting: 1.2    Passive exposure: Past   Smokeless tobacco: Never  Vaping Use   Vaping status: Never Used  Substance Use Topics   Alcohol use: No   Drug use: Never    Lindsey Williamson reports that she quit smoking about 14 months ago. Her smoking use included cigarettes. She has been exposed to tobacco smoke. She has never used smokeless tobacco. She reports that she does not drink alcohol and does not use drugs.  Tobacco Cessation: Counseling given: Not Answered Former smoker , quit 14 months ago.  Past surgical hx, Family hx, Social hx all reviewed.  Current Outpatient Medications on File Prior to Visit  Medication Sig   amLODipine (NORVASC) 5 MG tablet Take 2.5 mg by mouth in the morning.   aspirin EC 81 MG tablet Take 81 mg by mouth daily. Swallow whole.   azelastine (ASTELIN) 0.1 % nasal spray Place 2 sprays into both nostrils 2 (two) times daily as needed for rhinitis.   bisoprolol (ZEBETA) 5 MG tablet Take 2.5 mg by mouth daily.   cetirizine (ZYRTEC ALLERGY) 10 MG tablet Take 1 tablet (10 mg total) by mouth daily.   diclofenac Sodium (VOLTAREN)  1 % GEL Apply 2 g topically 2 (two) times daily as needed (for pain).   empagliflozin (JARDIANCE) 25 MG TABS tablet Take 25 mg by mouth daily at 6 PM.   ferrous sulfate 325 (65 FE) MG tablet Take 325 mg by mouth daily with breakfast.   fluticasone (FLONASE) 50 MCG/ACT nasal spray Place 1 spray into both nostrils daily.   glipiZIDE (GLUCOTROL XL) 5 MG 24 hr tablet Take 1 tablet (5 mg total) by mouth daily with breakfast.   guaiFENesin (MUCINEX) 600 MG 12 hr tablet Take 1 tablet (600 mg  total) by mouth 2 (two) times daily.   metFORMIN (GLUCOPHAGE-XR) 500 MG 24 hr tablet Take 1,000 mg by mouth in the morning and at bedtime.   nystatin (MYCOSTATIN/NYSTOP) powder Apply 1 Application topically 3 (three) times daily. (Patient taking differently: Apply 1 Application topically 3 (three) times daily as needed (for irritation- affectefd areas).)   pantoprazole (PROTONIX) 40 MG tablet Take 1 tablet (40 mg total) by mouth daily at 12 noon.   polyethylene glycol (MIRALAX / GLYCOLAX) 17 g packet Take 17 g packet by mouth daily.   pravastatin (PRAVACHOL) 80 MG tablet Take 80 mg by mouth at bedtime.   prochlorperazine (COMPAZINE) 10 MG tablet TAKE 1 TABLET(10 MG) BY MOUTH EVERY 6 HOURS AS NEEDED (Patient taking differently: Take 10 mg by mouth every 6 (six) hours as needed for nausea or vomiting.)   sucralfate (CARAFATE) 1 g tablet TAKE 1 TABLET(1 GRAM) BY MOUTH FOUR TIMES DAILY(WITH MEALS AND AT BEDTIME)   potassium chloride SA (KLOR-CON M) 20 MEQ tablet Take 2 tablets (40 mEq total) by mouth daily for 3 doses.   No current facility-administered medications on file prior to visit.     Allergies  Allergen Reactions   Benzonatate Palpitations   Lisinopril Swelling and Other (See Comments)    Patient ended up in the ED with a swollen mouth and tongue   Aspirin Palpitations and Nausea And Vomiting    Can tolerate baby aspirin without difficulty   Azithromycin Palpitations   Codeine Palpitations    Review Of Systems:  Constitutional:   No  weight loss, night sweats,  Fevers, chills, ++fatigue, or  lassitude.  HEENT:   No headaches,  Difficulty swallowing,  Tooth/dental problems, or  Sore throat,                No sneezing, itching, ear ache, nasal congestion,++ post nasal drip,   CV:  No chest pain,  Orthopnea, PND, swelling in lower extremities, anasarca, dizziness, palpitations, syncope.   GI  No heartburn, indigestion, abdominal pain, nausea, vomiting, diarrhea, change in bowel  habits, loss of appetite, bloody stools.   Resp: + shortness of breath with exertion less at rest.  No excess mucus, no productive cough,  No non-productive cough,  No coughing up of blood.  No change in color of mucus.  No wheezing.  No chest wall deformity  Skin: no rash or lesions.  GU: no dysuria, change in color of urine, no urgency or frequency.  No flank pain, no hematuria   MS:  No joint pain or swelling.  + decreased range of motion.  No back pain.  Psych:  No change in mood or affect. No depression , + anxiety.  No memory loss.   Vital Signs BP 118/76 (BP Location: Left Arm, Patient Position: Sitting, Cuff Size: Normal)   Pulse 92   Ht 5\' 4"  (1.626 m)   Wt 144 lb 9.6 oz (  65.6 kg)   SpO2 95%   BMI 24.82 kg/m    Physical Exam:  General- No distress,  A&Ox3, pleasant ENT: No sinus tenderness, TM clear, pale nasal mucosa, no oral exudate,no post nasal drip, no LAN Cardiac: S1, S2, regular rate and rhythm, no murmur Chest: No wheeze/ rales/ dullness; no accessory muscle use, no nasal flaring, no sternal retractions, slightly diminished per bases.  Abd.: Soft Non-tender, ND, BS +, Body mass index is 24.82 kg/m.  Ext: No clubbing cyanosis, edema Neuro:  normal strength, MAE x 4, A&O x 3,  Skin: No rashes, warm and dry, no obvious lesions  Psych: normal mood and behavior, anxious   Assessment/Plan Hospital Follow up for pneumonia/ pneumonitis 2/2 radiation/ immune therapy mediated? ( Cultures did not show any growth) Stage IIIa confirm (T3, N2, M0) non-small cell lung cancer, squamous cell carcinoma, post chemo/ radiation . Post 1 dose immune therapy Clinically improving Acute hypoxic respiratory Failure 2/2 above, weaned from 4 L to 2 L.  Plan I am glad you are feeling better.  Please start Zyrtec today for your seasonal allergies that are contributing to your cough. Start Astelin and Flonase today. We will do a CXR today. I will call you with the results.  Call me  for any changes after finishing your prednisone. We can send in another taper if needed Follow up in 2 weeks to ensure you are doing better. Call if you need [place] sooner.  Make sure your oxygen  levels are > 90% at all times.  Wear oxygen at 2 L  day and night for now. Please contact office for sooner follow up if symptoms do not improve or worsen or seek emergency care    I spent 35 minutes dedicated to the care of this patient on the date of this encounter to include pre-visit review of records, face-to-face time with the patient discussing conditions above, post visit ordering of testing, clinical documentation with the electronic health record, making appropriate referrals as documented, and communicating necessary information to the patient's healthcare team.     Bevelyn Ngo, NP 03/14/2024  10:50 AM

## 2024-03-14 NOTE — Addendum Note (Signed)
 Addended by: Kandice Robinsons F on: 03/14/2024 03:16 PM   Modules accepted: Orders

## 2024-03-14 NOTE — Patient Instructions (Addendum)
 It is good to see you today. I am glad you are feeling better.  Please start Zyrtec today for your seasonal allergies that are contributing to your cough. Start Astelin and Flonase today. We will do a CXR today. I will call you with the results.  Call me for any changes after finishing your prednisone. Follow up in 2 weeks to ensure you are doing better. Call if you need [place] sooner.  Make sure your oxygen  levels are > 90% at all times.  Wear oxygen at 2 L  day and night for now. Please contact office for sooner follow up if symptoms do not improve or worsen or seek emergency care

## 2024-03-17 IMAGING — CT CT CHEST W/ CM
2 of 4 series · 15 of 36 positions shown, 18 images · IV contrast (OMNIPAQUE)
Comparison: Previous studies including CT chest done on 03/17/2022
and chest radiographs done on 03/30/2022

CLINICAL DATA: Non-small-cell lung carcinoma, radiation treatment,
assess treatment response

EXAM:
CT CHEST WITH CONTRAST
TECHNIQUE: Multidetector CT imaging of the chest was performed during
intravenous contrast administration.

[Series 2: axial st · axial · 0.68mm/px · z∈[-351,-99]mm · 12 of 150 slices shown, 15 images]
[im 12/150  mediastinal]
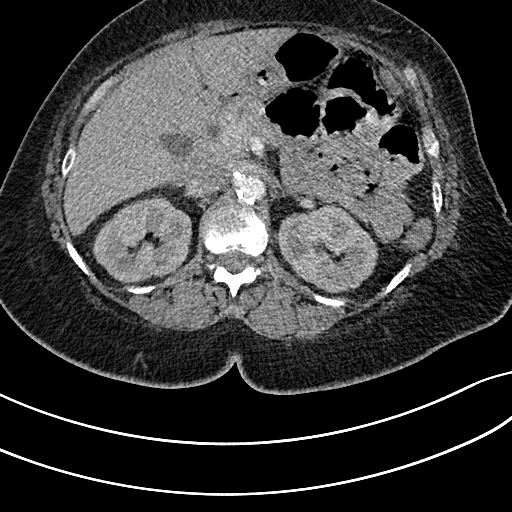
[im 12/150  lung]
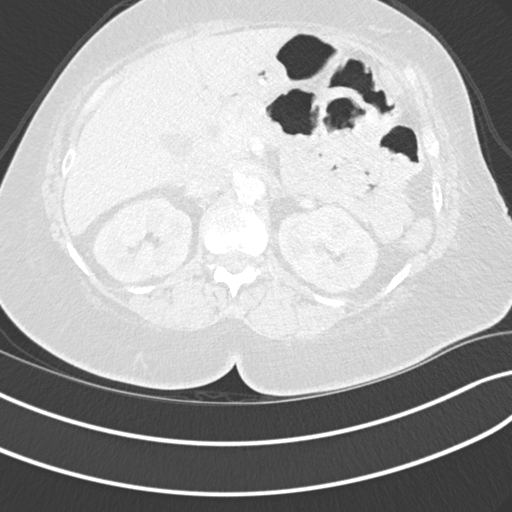
[im 23/150  lung]
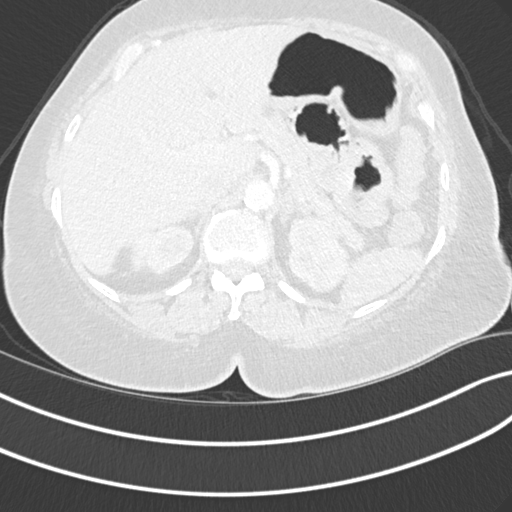
[im 35/150  lung]
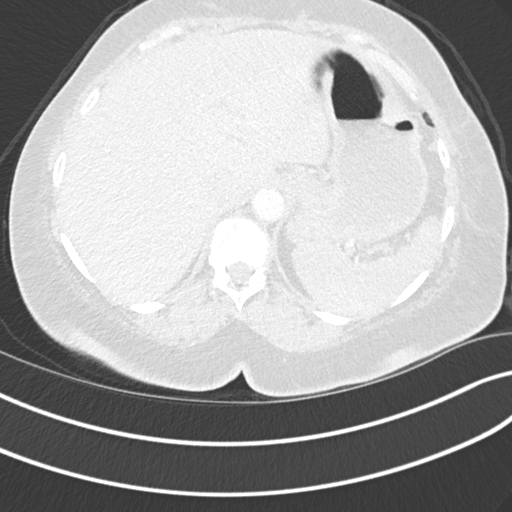
[im 46/150  lung]
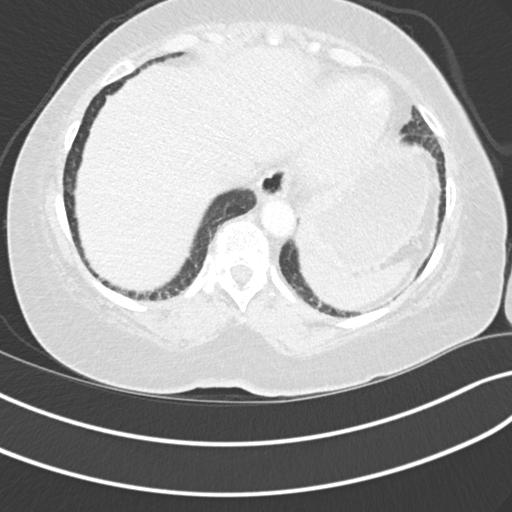
[im 58/150  mediastinal]
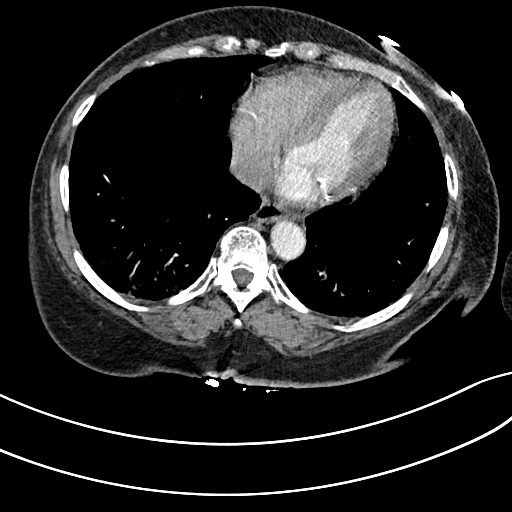
[im 58/150  lung]
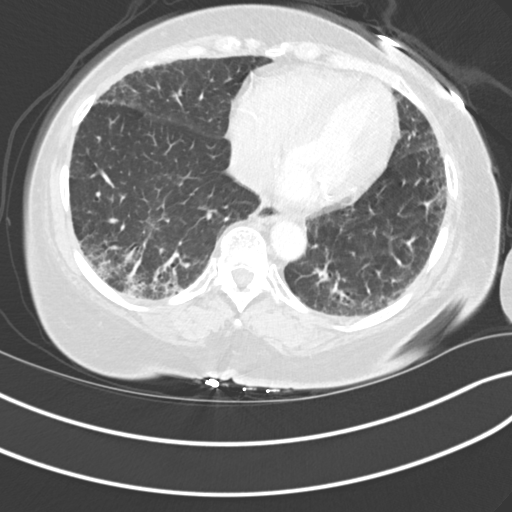
[im 69/150  lung]
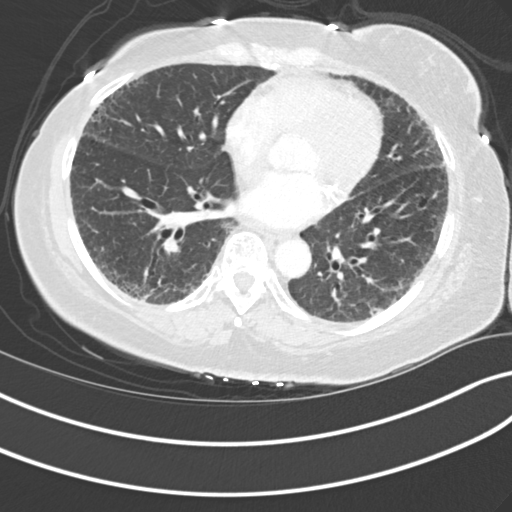
[im 81/150  lung]
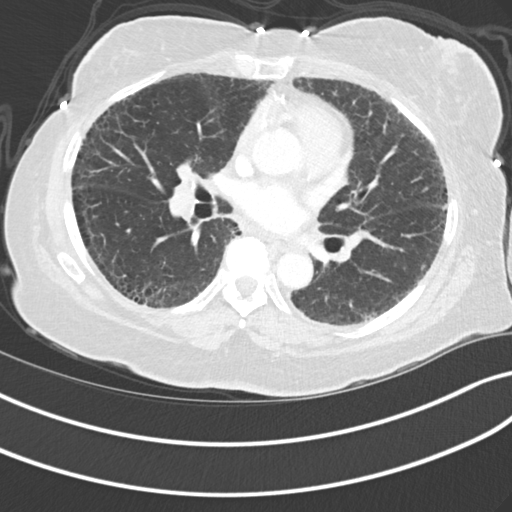
[im 92/150  lung]
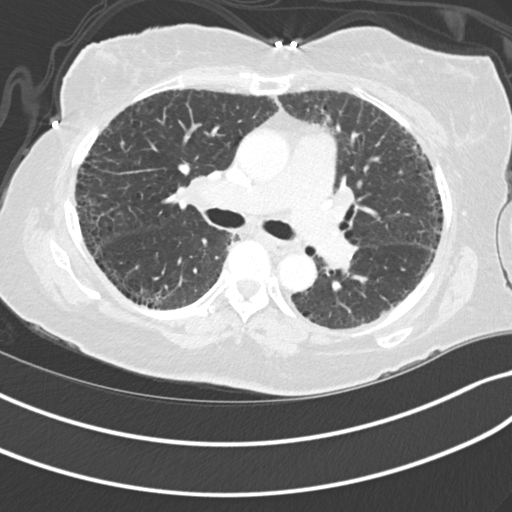
[im 104/150  mediastinal]
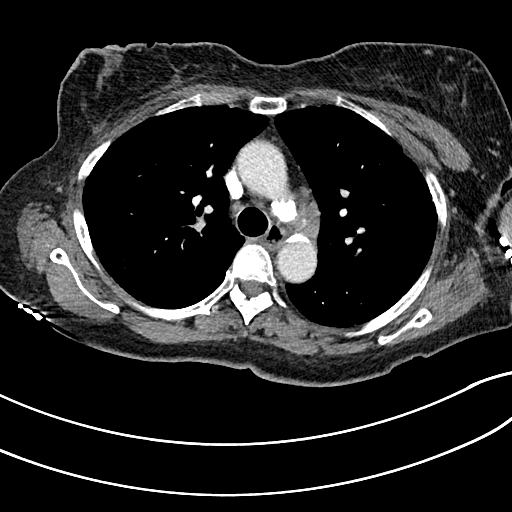
[im 104/150  lung]
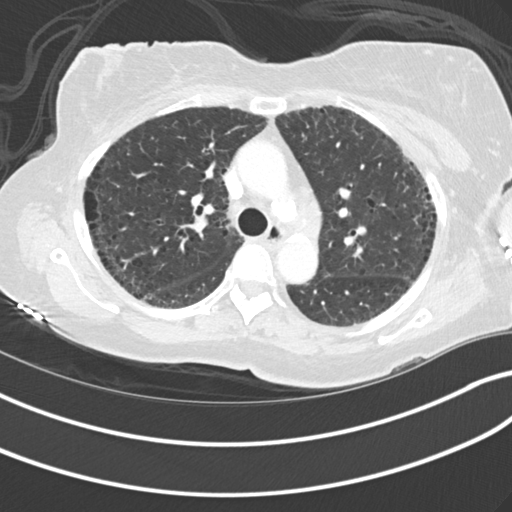
[im 115/150  lung]
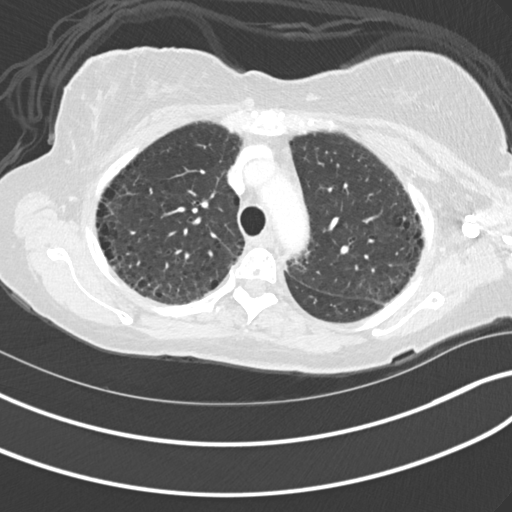
[im 127/150  lung]
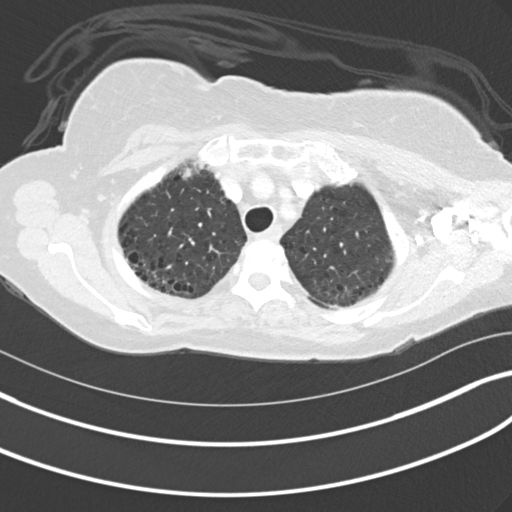
[im 138/150  lung]
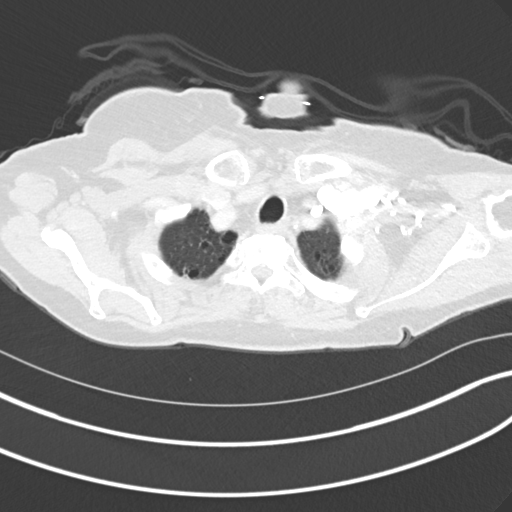

[Series 6: coronal · coronal · 0.61mm/px · 3 of 134 slices shown]
[im 27/134  lung]
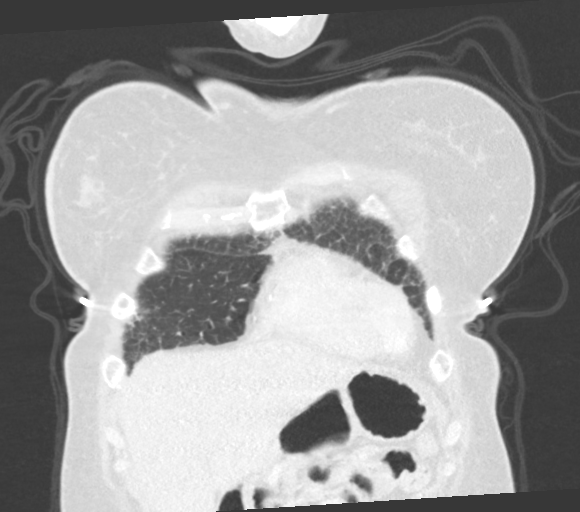
[im 54/134  lung]
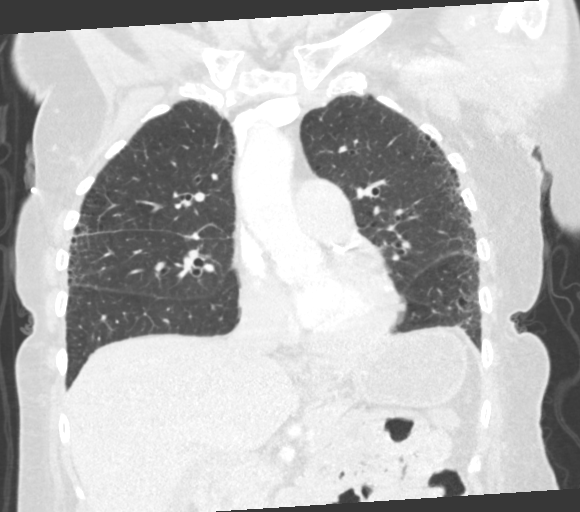
[im 80/134  lung]
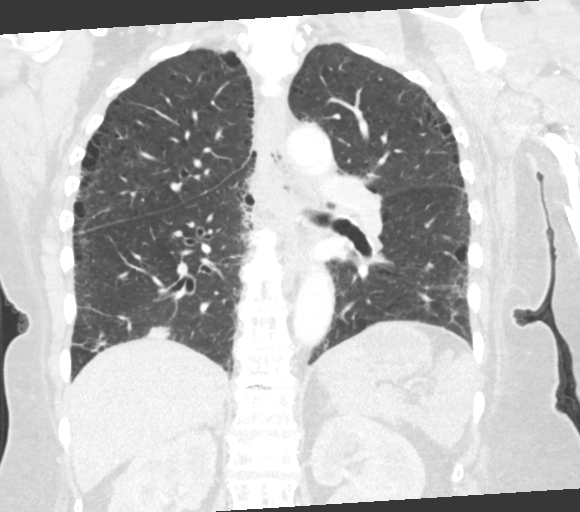

[15 of 36 positions shown; findings below may reference images not displayed]

RADIATION DOSE REDUCTION: This exam was performed according to the
departmental dose-optimization program which includes automated
exposure control, adjustment of the mA and/or kV according to
patient size and/or use of iterative reconstruction technique.

CONTRAST:  75mL OMNIPAQUE IOHEXOL 300 MG/ML  SOLN
FINDINGS: Cardiovascular: Coronary artery calcifications are seen. There is
homogeneous enhancement in thoracic aorta. There are no intraluminal
filling defects in the central pulmonary artery branches. There is
ectasia of main pulmonary artery measuring 3.4 cm suggesting
possible pulmonary arterial hypertension.

Mediastinum/Nodes: There are slightly enlarged lymph nodes in the
mediastinum and hilar regions with no significant interval change.

Lungs/Pleura: Increased interstitial markings are seen in the
periphery of both lungs. There is ectasia of bronchi. Findings
suggest chronic interstitial lung disease with fibrosis. There are
scattered subpleural blebs and bullae. There is 1.6 x 1.3 cm
noncalcified pleural-based nodule in the right lower lobe with
spiculated margins. The lesion measured 2.1 cm in maximum diameter
in the previous study. In the image eighty-seven of series 5 there
is 6 mm faint radiopacity in the posterior left lower lung fields
which appears less prominent. This may be part of scarring in the
periphery of both lungs. There are no new nodules or new infiltrates
in the lung fields.

Upper Abdomen: Unremarkable.

Musculoskeletal: Decrease in height of upper endplate of body of L1
vertebra appears stable.
IMPRESSION: There is interval decrease in size of noncalcified nodule in the
right lower lobe which measures 1.6 cm in maximum diameter in the
current study. There are no new nodules. No new significant
lymphadenopathy seen.

Chronic interstitial lung disease. Faint pleural-based density seen
in the left lower lobe in the previous study appears smaller in
size.

Coronary artery disease.

## 2024-03-19 ENCOUNTER — Encounter: Payer: Self-pay | Admitting: Pulmonary Disease

## 2024-03-19 ENCOUNTER — Telehealth: Payer: Self-pay | Admitting: *Deleted

## 2024-03-19 ENCOUNTER — Other Ambulatory Visit: Payer: Self-pay | Admitting: Pulmonary Disease

## 2024-03-19 MED ORDER — PREDNISONE 20 MG PO TABS: 20.0000 mg | ORAL_TABLET | Freq: Every day | ORAL | 0 refills | Status: DC

## 2024-03-19 MED ORDER — AZITHROMYCIN 250 MG PO TABS: ORAL_TABLET | ORAL | 0 refills | Status: DC

## 2024-03-19 NOTE — Telephone Encounter (Signed)
 PT left the following MYCHART Message:  Can you have doctor groce call me she said if I The cough got worse and the shortness of breath to call her but I try calling and was put on hold forever, thank you so much if she can get this message please

## 2024-03-19 NOTE — Telephone Encounter (Signed)
 Tomma Lightning, MD to [name]     03/19/24  1:38 PM Prescription for azithromycin and prednisone sent to pharmacy for you  Patient informed.

## 2024-03-19 NOTE — Telephone Encounter (Signed)
 Called patient.  C/o dry cough, some SOB during day and at night.  No fever.  No wheeze.  Patient has not had prednisone for 2 weeks.  Patient states Maralyn Sago told her if her sx worsened, to call and she would put her back on prednisone.  Patient would also like a cough medication to help her sleep at night.  Just had OV 03/14/2024 with Maralyn Sago.  Please advise.

## 2024-03-21 ENCOUNTER — Telehealth: Payer: Self-pay

## 2024-03-21 ENCOUNTER — Other Ambulatory Visit: Payer: Self-pay | Admitting: Pulmonary Disease

## 2024-03-21 MED ORDER — DOXYCYCLINE HYCLATE 100 MG PO TABS: 100.0000 mg | ORAL_TABLET | Freq: Two times a day (BID) | ORAL | 0 refills | Status: DC

## 2024-03-21 NOTE — Telephone Encounter (Signed)
 Spoke with patient regarding prior message. Advised patient Dr.Olalere has sent in a script for Doxycycline 100mg  . Patient 's voice was understanding. Nothing else further needed.

## 2024-03-22 ENCOUNTER — Other Ambulatory Visit: Payer: Self-pay | Admitting: Pulmonary Disease

## 2024-03-22 MED ORDER — GUAIFENESIN-DM 100-10 MG/5ML PO SYRP: 5.0000 mL | ORAL_SOLUTION | ORAL | 0 refills | Status: DC | PRN

## 2024-04-02 ENCOUNTER — Ambulatory Visit (INDEPENDENT_AMBULATORY_CARE_PROVIDER_SITE_OTHER): Admitting: Acute Care

## 2024-04-02 ENCOUNTER — Encounter: Payer: Self-pay | Admitting: Acute Care

## 2024-04-02 VITALS — BP 122/72 | HR 87 | Ht 64.0 in | Wt 144.8 lb

## 2024-04-02 DIAGNOSIS — R0609 Other forms of dyspnea: Secondary | ICD-10-CM | POA: Diagnosis not present

## 2024-04-02 DIAGNOSIS — Z8701 Personal history of pneumonia (recurrent): Secondary | ICD-10-CM | POA: Diagnosis not present

## 2024-04-02 DIAGNOSIS — C3490 Malignant neoplasm of unspecified part of unspecified bronchus or lung: Secondary | ICD-10-CM | POA: Diagnosis not present

## 2024-04-02 NOTE — Patient Instructions (Signed)
 It is good to see you today. We will order a CT Chest to see if we can determine what is making you so short of breath.  You will get a call to get this scheduled.  I will call you after the scan has been read to give you the results. Call us if you need [place] sooner.  Please contact office for sooner follow up if symptoms do not improve or worsen or seek emergency care

## 2024-04-02 NOTE — Progress Notes (Signed)
 History of Present Illness Lindsey Williamson is a 73 y.o. female  with history of stage Ia non-small cell lung cancer, adenocarcinoma the right upper lobe who under went radiation under the care of Dr. Kathrynn Williamson from 04/23/2022-04/29/22 2 Stage IIIa confirmed (T3, N2, M0) non-small cell lung cancer, squamous cell carcinoma. She presented with recurrent tumor at the right lung base, associated right lower lobe metastasis, and right hilar and subcarinal nodal metastases. This was diagnosed in October 2024.  Cancer Treatment PRIOR THERAPY: 1) Radiation to the right upper lobe under the care of Dr. Kathrynn Williamson from 04/23/2022-04/29/22  2) Concurrent chemoradiation with carboplatin for an AUC of 2 and paclitaxel 45 mg/m.  Last dose on 12/20/23 Status post 7 cycle.  3) Consolidation immunotherapy with Imfinzi 1500 mg IV every 4 weeks, first dose expected on 02/07/2024. Discontinued after 1 cycle due to concern for pneumonitis   CURRENT THERAPY: Observation      04/02/2024 Pt. Presents for follow up after being seen 03/14/2024 for continued shortness of breath after admission for pneumonia  and inflammatory changes possibly related to immune therapy 02/13/2024- 02/18/2024. She is concerned that her shortness of breath is no better.  She was unsure if her shortness of breath was related to allergies of infection,  she had no symptoms of infection, so we treated her with zyrtec and Flonase. She did call the office 03/19/2024 with dry cough, and SOB at night.She was sent in a prescription for azithromycin and prednisone at that time. She presents today stating she is no better.  She states the shortness of breath is what is most bothersome. She has physical therapy and she does not feel like she is getting any better. Oxygen saturations are stable and > 92% at all times even  when she is working with PT. She continues to wear her oxygen at 2 L [place]. She states her cough is now more dry, and the shortness of breath is worse  after cough or with minimal exertion. The copious secretions are much less after adding Zyrtec and Flonase. She has had no fever, white secretions.She does not appear volume overloaded. She does not have any leg pain, or or swelling. She is not tachycardic.  She has completed the prednisone and she is still taking the antibiotic. She states she feels there has been no improvement with either, but she is better since her original admission.   She denies any wheezing. She is bothered by how short of breath she gets with minimal activity.She is normally an active person.We will do a CT Chest to better evaluate.   Test Results: CXR 03/14/2024 Continued coarse diffuse interstitial densities are noted bilaterally concerning for atypical pneumonia, most prominently seen in right perihilar and basilar regions.  Treated with Azithromycin and prednisone taper.   CTA 02/13/2024 No evidence of pulmonary embolus. 2. Three-vessel coronary artery disease.  Borderline cardiomegaly. 3. Mediastinal and right hilar adenopathy unchanged. Number for airspace opacity at the right lung base in the right lower lobe unchanged since recent study. 4. Increasing ground-glass opacities throughout the lungs, right greater than left. This could reflect edema or infection.       Latest Ref Rng & Units 03/07/2024    8:19 AM 02/16/2024    5:59 AM 02/13/2024    1:09 PM  CBC  WBC 4.0 - 10.5 K/uL 5.6  3.3  4.7   Hemoglobin 12.0 - 15.0 g/dL 16.1  09.6  04.5   Hematocrit 36.0 - 46.0 % 33.6  35.3  36.9   Platelets 150 - 400 K/uL 236  292  277        Latest Ref Rng & Units 03/07/2024    8:19 AM 02/18/2024    8:03 AM 02/17/2024    5:53 AM  BMP  Glucose 70 - 99 mg/dL 161  096  045   BUN 8 - 23 mg/dL 13  46  39   Creatinine 0.44 - 1.00 mg/dL 4.09  8.11  9.14   Sodium 135 - 145 mmol/L 135  135  131   Potassium 3.5 - 5.1 mmol/L 3.9  3.2  3.9   Chloride 98 - 111 mmol/L 104  101  96   CO2 22 - 32 mmol/L 23  23  21    Calcium  8.9 - 10.3 mg/dL 8.7  8.5  8.7     BNP No results found for: "BNP"  ProBNP No results found for: "PROBNP"  PFT No results found for: "FEV1PRE", "FEV1POST", "FVCPRE", "FVCPOST", "TLC", "DLCOUNC", "PREFEV1FVCRT", "PSTFEV1FVCRT"  DG Chest 2 View Result Date: 03/14/2024 CLINICAL DATA:  Pneumonia. EXAM: CHEST - 2 VIEW COMPARISON:  February 15, 2024. FINDINGS: The heart size and mediastinal contours are within normal limits. There is continued coarse interstitial densities throughout both lungs concerning for atypical pneumonia, which is most prominently seen in the right perihilar and basilar regions. The visualized skeletal structures are unremarkable. IMPRESSION: Continued coarse diffuse interstitial densities are noted bilaterally concerning for atypical pneumonia, most prominently seen in right perihilar and basilar regions. Electronically Signed   By: Lupita Raider M.D.   On: 03/14/2024 13:37     Past medical hx Past Medical History:  Diagnosis Date   Angioedema 10/17/2023   Arthritis    Diabetes mellitus without complication (HCC)    Hypertension      Social History   Tobacco Use   Smoking status: Former    Current packs/day: 0.00    Types: Cigarettes    Quit date: 2024    Years since quitting: 1.2    Passive exposure: Past   Smokeless tobacco: Never  Vaping Use   Vaping status: Never Used  Substance Use Topics   Alcohol use: No   Drug use: Never    LindseyWilliamson reports that she quit smoking about 15 months ago. Her smoking use included cigarettes. She has been exposed to tobacco smoke. She has never used smokeless tobacco. She reports that she does not drink alcohol and does not use drugs.  Tobacco Cessation: Counseling given: Not Answered Former smoker, quit 2010   Past surgical hx, Family hx, Social hx all reviewed.  Current Outpatient Medications on File Prior to Visit  Medication Sig   amLODipine (NORVASC) 5 MG tablet Take 2.5 mg by mouth in the morning.    aspirin EC 81 MG tablet Take 81 mg by mouth daily. Swallow whole.   azelastine (ASTELIN) 0.1 % nasal spray Place 2 sprays into both nostrils 2 (two) times daily as needed for rhinitis.   bisoprolol (ZEBETA) 5 MG tablet Take 2.5 mg by mouth daily.   cetirizine (ZYRTEC ALLERGY) 10 MG tablet Take 1 tablet (10 mg total) by mouth daily.   diclofenac Sodium (VOLTAREN) 1 % GEL Apply 2 g topically 2 (two) times daily as needed (for pain).   doxycycline (VIBRA-TABS) 100 MG tablet Take 1 tablet (100 mg total) by mouth 2 (two) times daily.   empagliflozin (JARDIANCE) 25 MG TABS tablet Take 25 mg by mouth daily at 6 PM.  ferrous sulfate 325 (65 FE) MG tablet Take 325 mg by mouth daily with breakfast.   fluticasone (FLONASE) 50 MCG/ACT nasal spray Place 1 spray into both nostrils daily.   glipiZIDE (GLUCOTROL XL) 5 MG 24 hr tablet Take 1 tablet (5 mg total) by mouth daily with breakfast.   guaiFENesin (MUCINEX) 600 MG 12 hr tablet Take 1 tablet (600 mg total) by mouth 2 (two) times daily.   guaiFENesin-dextromethorphan (ROBITUSSIN DM) 100-10 MG/5ML syrup Take 5 mLs by mouth every 4 (four) hours as needed for cough.   metFORMIN (GLUCOPHAGE-XR) 500 MG 24 hr tablet Take 1,000 mg by mouth in the morning and at bedtime.   nystatin (MYCOSTATIN/NYSTOP) powder Apply 1 Application topically 3 (three) times daily. (Patient taking differently: Apply 1 Application topically 3 (three) times daily as needed (for irritation- affectefd areas).)   omeprazole (PRILOSEC) 20 MG capsule Take 20 mg by mouth daily.   pantoprazole (PROTONIX) 40 MG tablet Take 1 tablet (40 mg total) by mouth daily at 12 noon.   polyethylene glycol (MIRALAX / GLYCOLAX) 17 g packet Take 17 g packet by mouth daily.   pravastatin (PRAVACHOL) 80 MG tablet Take 80 mg by mouth at bedtime.   predniSONE (DELTASONE) 20 MG tablet Take 1 tablet (20 mg total) by mouth daily with breakfast.   prochlorperazine (COMPAZINE) 10 MG tablet TAKE 1 TABLET(10 MG) BY MOUTH  EVERY 6 HOURS AS NEEDED (Patient taking differently: Take 10 mg by mouth every 6 (six) hours as needed for nausea or vomiting.)   sucralfate (CARAFATE) 1 g tablet TAKE 1 TABLET(1 GRAM) BY MOUTH FOUR TIMES DAILY(WITH MEALS AND AT BEDTIME)   potassium chloride SA (KLOR-CON M) 20 MEQ tablet Take 2 tablets (40 mEq total) by mouth daily for 3 doses.   No current facility-administered medications on file prior to visit.     Allergies  Allergen Reactions   Benzonatate Palpitations   Lisinopril Swelling and Other (See Comments)    Patient ended up in the ED with a swollen mouth and tongue   Aspirin Palpitations and Nausea And Vomiting    Can tolerate baby aspirin without difficulty   Azithromycin Palpitations   Codeine Palpitations    Review Of Systems:  Constitutional:   No  weight loss, night sweats,  Fevers, chills, fatigue, or  lassitude.  HEENT:   No headaches,  Difficulty swallowing,  Tooth/dental problems, or  Sore throat,                No sneezing, itching, ear ache, nasal congestion, post nasal drip,   CV:  No chest pain,  Orthopnea, PND, swelling in lower extremities, anasarca, dizziness, palpitations, syncope.   GI  No heartburn, indigestion, abdominal pain, nausea, vomiting, diarrhea, change in bowel habits, loss of appetite, bloody stools.   Resp: ++ shortness of breath with exertion less at rest.  No excess mucus, no productive cough,  + non-productive cough,  No coughing up of blood.  No change in color of mucus.  No wheezing.  No chest wall deformity  Skin: no rash or lesions.  GU: no dysuria, change in color of urine, no urgency or frequency.  No flank pain, no hematuria   MS:  No joint pain or swelling.  + decreased range of motion.  No back pain.  Psych:  No change in mood or affect. No depression or anxiety.  No memory loss.   Vital Signs BP 122/72 (BP Location: Right Arm, Patient Position: Sitting, Cuff Size: Normal)  Pulse 87   Ht 5\' 4"  (1.626 m)   Wt 144  lb 12.8 oz (65.7 kg)   SpO2 97%   BMI 24.85 kg/m    Physical Exam:  General- No distress,  A&Ox3, pleasant ENT: No sinus tenderness, TM clear, pale nasal mucosa, no oral exudate,no post nasal drip, no LAN Cardiac: S1, S2, regular rate and rhythm, no murmur Chest: No wheeze/ rales/ dullness; no accessory muscle use, no nasal flaring, no sternal retractions, diminished per bases Abd.: Soft Non-tender, ND, BS +. Body mass index is 24.85 kg/m.  Ext: No clubbing cyanosis, edema Neuro:  physical deconditioning, A&O x 3, MAE x 4 Skin: No rashes, warm and dry, no lesions  Psych: normal mood and behavior   Assessment/Plan Slow to resolve  Dyspnea in setting of previous  pneumonia/ pneumonitis 2/2 radiation/ immune therapy mediated? ( Cultures did not show any growth) Stage IIIa confirm (T3, N2, M0) non-small cell lung cancer, squamous cell carcinoma, post chemo/ radiation . Post 1 dose immune therapy prior to symptoms Clinically improving, but not completely resolved Acute hypoxic respiratory Failure 2/2 above, weaned from 4 L to 2 L.  Plan We will order a CT Chest to see if we can determine what is making you so short of breath.  You will get a call to get this scheduled.  I will call you after the scan has been read to give you the results. Call us if you need [place] sooner.  Continue oxygen at 2 L [place] as you have been doing, Oxygen saturations should not be less than 92% at any time. Please contact office for sooner follow up if symptoms do not improve or worsen or seek emergency care    I spent 30 minutes dedicated to the care of this patient on the date of this encounter to include pre-visit review of records, face-to-face time with the patient discussing conditions above, post visit ordering of testing, clinical documentation with the electronic health record, making appropriate referrals as documented, and communicating necessary information to the patient's healthcare team.    Bevelyn Ngo, NP 04/02/2024  10:52 AM

## 2024-04-04 ENCOUNTER — Ambulatory Visit (HOSPITAL_COMMUNITY)
Admission: RE | Admit: 2024-04-04 | Discharge: 2024-04-04 | Disposition: A | Discharge status: Home / Self Care | Source: Ambulatory Visit | Attending: Acute Care | Admitting: Acute Care

## 2024-04-04 ENCOUNTER — Ambulatory Visit: Payer: Medicare Other | Admitting: Physician Assistant

## 2024-04-04 ENCOUNTER — Encounter (HOSPITAL_COMMUNITY): Payer: Self-pay

## 2024-04-04 ENCOUNTER — Other Ambulatory Visit: Payer: Medicare Other

## 2024-04-04 ENCOUNTER — Ambulatory Visit: Payer: Medicare Other

## 2024-04-04 DIAGNOSIS — R0609 Other forms of dyspnea: Secondary | ICD-10-CM | POA: Insufficient documentation

## 2024-04-04 DIAGNOSIS — E876 Hypokalemia: Secondary | ICD-10-CM | POA: Diagnosis not present

## 2024-04-04 DIAGNOSIS — J700 Acute pulmonary manifestations due to radiation: Secondary | ICD-10-CM | POA: Diagnosis not present

## 2024-04-04 MED ORDER — IOHEXOL 300 MG/ML  SOLN
75.0000 mL | Freq: Once | INTRAMUSCULAR | Status: AC | PRN
  Administered 2024-04-04: 75 mL via INTRAVENOUS

## 2024-04-06 ENCOUNTER — Emergency Department (HOSPITAL_COMMUNITY)

## 2024-04-06 ENCOUNTER — Inpatient Hospital Stay (HOSPITAL_COMMUNITY)
Admission: EM | Admit: 2024-04-06 | Discharge: 2024-04-11 | DRG: 205 | Disposition: A | Discharge status: Home Health | Attending: Internal Medicine | Admitting: Internal Medicine

## 2024-04-06 ENCOUNTER — Other Ambulatory Visit: Payer: Self-pay

## 2024-04-06 DIAGNOSIS — Z66 Do not resuscitate: Secondary | ICD-10-CM | POA: Diagnosis present

## 2024-04-06 DIAGNOSIS — J470 Bronchiectasis with acute lower respiratory infection: Secondary | ICD-10-CM | POA: Diagnosis present

## 2024-04-06 DIAGNOSIS — R7989 Other specified abnormal findings of blood chemistry: Secondary | ICD-10-CM

## 2024-04-06 DIAGNOSIS — Z7982 Long term (current) use of aspirin: Secondary | ICD-10-CM

## 2024-04-06 DIAGNOSIS — C3491 Malignant neoplasm of unspecified part of right bronchus or lung: Secondary | ICD-10-CM | POA: Diagnosis present

## 2024-04-06 DIAGNOSIS — Z1152 Encounter for screening for COVID-19: Secondary | ICD-10-CM | POA: Diagnosis not present

## 2024-04-06 DIAGNOSIS — E785 Hyperlipidemia, unspecified: Secondary | ICD-10-CM | POA: Diagnosis present

## 2024-04-06 DIAGNOSIS — J309 Allergic rhinitis, unspecified: Secondary | ICD-10-CM | POA: Diagnosis present

## 2024-04-06 DIAGNOSIS — I2489 Other forms of acute ischemic heart disease: Secondary | ICD-10-CM | POA: Diagnosis present

## 2024-04-06 DIAGNOSIS — I2510 Atherosclerotic heart disease of native coronary artery without angina pectoris: Secondary | ICD-10-CM | POA: Diagnosis present

## 2024-04-06 DIAGNOSIS — J441 Chronic obstructive pulmonary disease with (acute) exacerbation: Secondary | ICD-10-CM | POA: Diagnosis present

## 2024-04-06 DIAGNOSIS — Z951 Presence of aortocoronary bypass graft: Secondary | ICD-10-CM

## 2024-04-06 DIAGNOSIS — E876 Hypokalemia: Secondary | ICD-10-CM | POA: Diagnosis present

## 2024-04-06 DIAGNOSIS — K219 Gastro-esophageal reflux disease without esophagitis: Secondary | ICD-10-CM | POA: Diagnosis present

## 2024-04-06 DIAGNOSIS — Z9981 Dependence on supplemental oxygen: Secondary | ICD-10-CM

## 2024-04-06 DIAGNOSIS — J849 Interstitial pulmonary disease, unspecified: Secondary | ICD-10-CM | POA: Insufficient documentation

## 2024-04-06 DIAGNOSIS — I10 Essential (primary) hypertension: Secondary | ICD-10-CM | POA: Diagnosis present

## 2024-04-06 DIAGNOSIS — Y842 Radiological procedure and radiotherapy as the cause of abnormal reaction of the patient, or of later complication, without mention of misadventure at the time of the procedure: Secondary | ICD-10-CM | POA: Diagnosis present

## 2024-04-06 DIAGNOSIS — E1165 Type 2 diabetes mellitus with hyperglycemia: Secondary | ICD-10-CM | POA: Diagnosis present

## 2024-04-06 DIAGNOSIS — Z87891 Personal history of nicotine dependence: Secondary | ICD-10-CM | POA: Diagnosis not present

## 2024-04-06 DIAGNOSIS — J439 Emphysema, unspecified: Secondary | ICD-10-CM | POA: Diagnosis present

## 2024-04-06 DIAGNOSIS — Z85118 Personal history of other malignant neoplasm of bronchus and lung: Secondary | ICD-10-CM

## 2024-04-06 DIAGNOSIS — J700 Acute pulmonary manifestations due to radiation: Secondary | ICD-10-CM | POA: Diagnosis present

## 2024-04-06 DIAGNOSIS — J984 Other disorders of lung: Secondary | ICD-10-CM

## 2024-04-06 DIAGNOSIS — Z888 Allergy status to other drugs, medicaments and biological substances status: Secondary | ICD-10-CM

## 2024-04-06 DIAGNOSIS — J438 Other emphysema: Secondary | ICD-10-CM | POA: Diagnosis not present

## 2024-04-06 DIAGNOSIS — Z7984 Long term (current) use of oral hypoglycemic drugs: Secondary | ICD-10-CM | POA: Diagnosis not present

## 2024-04-06 DIAGNOSIS — J449 Chronic obstructive pulmonary disease, unspecified: Secondary | ICD-10-CM | POA: Insufficient documentation

## 2024-04-06 DIAGNOSIS — J9621 Acute and chronic respiratory failure with hypoxia: Secondary | ICD-10-CM | POA: Diagnosis present

## 2024-04-06 DIAGNOSIS — Z881 Allergy status to other antibiotic agents status: Secondary | ICD-10-CM

## 2024-04-06 DIAGNOSIS — Z79899 Other long term (current) drug therapy: Secondary | ICD-10-CM

## 2024-04-06 DIAGNOSIS — Z923 Personal history of irradiation: Secondary | ICD-10-CM

## 2024-04-06 DIAGNOSIS — Z885 Allergy status to narcotic agent status: Secondary | ICD-10-CM

## 2024-04-06 DIAGNOSIS — J189 Pneumonia, unspecified organism: Secondary | ICD-10-CM

## 2024-04-06 DIAGNOSIS — I5A Non-ischemic myocardial injury (non-traumatic): Secondary | ICD-10-CM | POA: Diagnosis present

## 2024-04-06 DIAGNOSIS — R918 Other nonspecific abnormal finding of lung field: Secondary | ICD-10-CM | POA: Diagnosis not present

## 2024-04-06 DIAGNOSIS — Z825 Family history of asthma and other chronic lower respiratory diseases: Secondary | ICD-10-CM

## 2024-04-06 DIAGNOSIS — Z886 Allergy status to analgesic agent status: Secondary | ICD-10-CM

## 2024-04-06 DIAGNOSIS — E871 Hypo-osmolality and hyponatremia: Secondary | ICD-10-CM | POA: Diagnosis present

## 2024-04-06 LAB — CBC WITH DIFFERENTIAL/PLATELET
Abs Immature Granulocytes: 0.02 10*3/uL (ref 0.00–0.07)
Basophils Absolute: 0 10*3/uL (ref 0.0–0.1)
Basophils Relative: 1 %
Eosinophils Absolute: 0.2 10*3/uL (ref 0.0–0.5)
Eosinophils Relative: 3 %
HCT: 38.6 % (ref 36.0–46.0)
Hemoglobin: 11.9 g/dL — ABNORMAL LOW (ref 12.0–15.0)
Immature Granulocytes: 0 %
Lymphocytes Relative: 15 %
Lymphs Abs: 0.9 10*3/uL (ref 0.7–4.0)
MCH: 27 pg (ref 26.0–34.0)
MCHC: 30.8 g/dL (ref 30.0–36.0)
MCV: 87.5 fL (ref 80.0–100.0)
Monocytes Absolute: 0.7 10*3/uL (ref 0.1–1.0)
Monocytes Relative: 12 %
Neutro Abs: 4.2 10*3/uL (ref 1.7–7.7)
Neutrophils Relative %: 69 %
Platelets: 334 10*3/uL (ref 150–400)
RBC: 4.41 MIL/uL (ref 3.87–5.11)
RDW: 16.3 % — ABNORMAL HIGH (ref 11.5–15.5)
WBC: 6 10*3/uL (ref 4.0–10.5)
nRBC: 0 % (ref 0.0–0.2)

## 2024-04-06 LAB — MAGNESIUM: Magnesium: 1 mg/dL — ABNORMAL LOW (ref 1.7–2.4)

## 2024-04-06 LAB — BASIC METABOLIC PANEL WITH GFR
Anion gap: 10 (ref 5–15)
BUN: 11 mg/dL (ref 8–23)
CO2: 20 mmol/L — ABNORMAL LOW (ref 22–32)
Calcium: 8.7 mg/dL — ABNORMAL LOW (ref 8.9–10.3)
Chloride: 100 mmol/L (ref 98–111)
Creatinine, Ser: 0.54 mg/dL (ref 0.44–1.00)
GFR, Estimated: >60 mL/min (ref >60)
Glucose, Bld: 117 mg/dL — ABNORMAL HIGH (ref 70–99)
Potassium: 3.1 mmol/L — ABNORMAL LOW (ref 3.5–5.1)
Sodium: 130 mmol/L — ABNORMAL LOW (ref 135–145)

## 2024-04-06 LAB — RESP PANEL BY RT-PCR (RSV, FLU A&B, COVID)  RVPGX2
Influenza A by PCR: NEGATIVE
Influenza B by PCR: NEGATIVE
Resp Syncytial Virus by PCR: NEGATIVE
SARS Coronavirus 2 by RT PCR: NEGATIVE

## 2024-04-06 LAB — TROPONIN I (HIGH SENSITIVITY)
Troponin I (High Sensitivity): 117 ng/L (ref <18)
Troponin I (High Sensitivity): 107 ng/L (ref <18)

## 2024-04-06 LAB — BRAIN NATRIURETIC PEPTIDE: B Natriuretic Peptide: 141.6 pg/mL — ABNORMAL HIGH (ref 0.0–100.0)

## 2024-04-06 LAB — I-STAT CG4 LACTIC ACID, ED: Lactic Acid, Venous: 1.1 mmol/L (ref 0.5–1.9)

## 2024-04-06 MED ORDER — POTASSIUM CHLORIDE CRYS ER 20 MEQ PO TBCR
40.0000 meq | EXTENDED_RELEASE_TABLET | Freq: Once | ORAL | Status: AC
  Administered 2024-04-06: 40 meq via ORAL
  Filled 2024-04-06: qty 2

## 2024-04-06 MED ORDER — PRAVASTATIN SODIUM 40 MG PO TABS
80.0000 mg | ORAL_TABLET | Freq: Every day | ORAL | Status: DC
  Administered 2024-04-06 – 2024-04-10 (×5): 80 mg via ORAL
  Filled 2024-04-06 (×2): qty 2
  Filled 2024-04-06: qty 4
  Filled 2024-04-06: qty 2
  Filled 2024-04-06: qty 4

## 2024-04-06 MED ORDER — IPRATROPIUM-ALBUTEROL 0.5-2.5 (3) MG/3ML IN SOLN
3.0000 mL | Freq: Four times a day (QID) | RESPIRATORY_TRACT | Status: DC
  Administered 2024-04-06 – 2024-04-07 (×3): 3 mL via RESPIRATORY_TRACT
  Filled 2024-04-06 (×3): qty 3

## 2024-04-06 MED ORDER — SODIUM CHLORIDE 0.9% FLUSH
3.0000 mL | Freq: Two times a day (BID) | INTRAVENOUS | Status: DC
Start: 2024-04-06 — End: 2024-04-11
  Administered 2024-04-06 – 2024-04-11 (×9): 3 mL via INTRAVENOUS

## 2024-04-06 MED ORDER — MAGNESIUM SULFATE 2 GM/50ML IV SOLN
2.0000 g | Freq: Once | INTRAVENOUS | Status: AC
  Administered 2024-04-07: 2 g via INTRAVENOUS
  Filled 2024-04-06: qty 50

## 2024-04-06 MED ORDER — ASPIRIN 81 MG PO TBEC
81.0000 mg | DELAYED_RELEASE_TABLET | Freq: Every day | ORAL | Status: DC
  Administered 2024-04-07 – 2024-04-11 (×5): 81 mg via ORAL
  Filled 2024-04-06 (×5): qty 1

## 2024-04-06 MED ORDER — POTASSIUM CHLORIDE 10 MEQ/100ML IV SOLN
10.0000 meq | INTRAVENOUS | Status: AC
  Administered 2024-04-06 – 2024-04-07 (×3): 10 meq via INTRAVENOUS
  Filled 2024-04-06 (×4): qty 100

## 2024-04-06 MED ORDER — BISOPROLOL FUMARATE 5 MG PO TABS
2.5000 mg | ORAL_TABLET | Freq: Every day | ORAL | Status: DC
  Administered 2024-04-07 – 2024-04-11 (×5): 2.5 mg via ORAL
  Filled 2024-04-06 (×4): qty 1
  Filled 2024-04-06: qty 0.5

## 2024-04-06 MED ORDER — ENOXAPARIN SODIUM 40 MG/0.4ML IJ SOSY
40.0000 mg | PREFILLED_SYRINGE | INTRAMUSCULAR | Status: DC
  Administered 2024-04-07 – 2024-04-11 (×5): 40 mg via SUBCUTANEOUS
  Filled 2024-04-06 (×5): qty 0.4

## 2024-04-06 MED ORDER — METHYLPREDNISOLONE SODIUM SUCC 125 MG IJ SOLR
60.0000 mg | Freq: Two times a day (BID) | INTRAMUSCULAR | Status: DC
  Administered 2024-04-07: 60 mg via INTRAVENOUS
  Filled 2024-04-06: qty 2

## 2024-04-06 MED ORDER — AMLODIPINE BESYLATE 5 MG PO TABS
2.5000 mg | ORAL_TABLET | Freq: Every day | ORAL | Status: DC
  Administered 2024-04-07 – 2024-04-11 (×5): 2.5 mg via ORAL
  Filled 2024-04-06 (×5): qty 1

## 2024-04-06 MED ORDER — FERROUS SULFATE 325 (65 FE) MG PO TABS
325.0000 mg | ORAL_TABLET | Freq: Every day | ORAL | Status: DC
  Administered 2024-04-07 – 2024-04-11 (×5): 325 mg via ORAL
  Filled 2024-04-06 (×5): qty 1

## 2024-04-06 MED ORDER — VANCOMYCIN HCL 1250 MG/250ML IV SOLN
1250.0000 mg | INTRAVENOUS | Status: DC
  Administered 2024-04-07: 1250 mg via INTRAVENOUS
  Filled 2024-04-06: qty 250

## 2024-04-06 MED ORDER — VANCOMYCIN HCL 1250 MG/250ML IV SOLN
1250.0000 mg | Freq: Once | INTRAVENOUS | Status: AC
  Administered 2024-04-06: 1250 mg via INTRAVENOUS
  Filled 2024-04-06: qty 250

## 2024-04-06 MED ORDER — AZELASTINE HCL 0.1 % NA SOLN: 2.0000 | Freq: Two times a day (BID) | NASAL | Status: DC | PRN

## 2024-04-06 MED ORDER — IPRATROPIUM-ALBUTEROL 0.5-2.5 (3) MG/3ML IN SOLN
3.0000 mL | Freq: Once | RESPIRATORY_TRACT | Status: AC
  Administered 2024-04-06: 3 mL via RESPIRATORY_TRACT
  Filled 2024-04-06: qty 3

## 2024-04-06 MED ORDER — LORATADINE 10 MG PO TABS
10.0000 mg | ORAL_TABLET | Freq: Every day | ORAL | Status: DC
  Administered 2024-04-07 – 2024-04-11 (×5): 10 mg via ORAL
  Filled 2024-04-06 (×5): qty 1

## 2024-04-06 MED ORDER — GUAIFENESIN ER 600 MG PO TB12
600.0000 mg | ORAL_TABLET | Freq: Two times a day (BID) | ORAL | Status: DC
  Administered 2024-04-06 – 2024-04-11 (×10): 600 mg via ORAL
  Filled 2024-04-06 (×10): qty 1

## 2024-04-06 MED ORDER — FLUTICASONE PROPIONATE 50 MCG/ACT NA SUSP: 1.0000 | Freq: Every day | NASAL | Status: DC | PRN

## 2024-04-06 MED ORDER — LEVOFLOXACIN IN D5W 750 MG/150ML IV SOLN
750.0000 mg | INTRAVENOUS | Status: DC
  Administered 2024-04-06: 750 mg via INTRAVENOUS
  Filled 2024-04-06: qty 150

## 2024-04-06 MED ORDER — ALBUTEROL SULFATE (2.5 MG/3ML) 0.083% IN NEBU: 2.5000 mg | INHALATION_SOLUTION | RESPIRATORY_TRACT | Status: DC | PRN

## 2024-04-06 MED ORDER — METHYLPREDNISOLONE SODIUM SUCC 125 MG IJ SOLR
125.0000 mg | Freq: Once | INTRAMUSCULAR | Status: AC
  Administered 2024-04-06: 125 mg via INTRAVENOUS
  Filled 2024-04-06: qty 2

## 2024-04-06 MED ORDER — MAGNESIUM SULFATE 2 GM/50ML IV SOLN
2.0000 g | Freq: Once | INTRAVENOUS | Status: AC
  Administered 2024-04-06: 2 g via INTRAVENOUS
  Filled 2024-04-06: qty 50

## 2024-04-06 MED ORDER — SODIUM CHLORIDE 0.9 % IV SOLN: 2.0000 g | Freq: Two times a day (BID) | INTRAVENOUS | Status: DC

## 2024-04-06 NOTE — H&P (Signed)
 History and Physical    Lindsey Williamson:[id number] DOB: 1951-03-05 DOA: 04/06/2024  PCP: [name], MD   Patient coming from: Home   Chief Complaint:  Chief Complaint  Patient presents with   Shortness of Breath         HPI:  Lindsey Williamson is a 73 y.o. 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  Reports over the past week has had progressive dyspnea on exertion and now unable to go about 5 steps without stopping.  O2 sat have been less than 88% while on her home 2 L with activity.  She has a dry cough which is unchanged from prior.  Has chronic allergic rhinitis, denies any new URI symptoms.  No dysphagia.  No fevers or chills.  No chest pain, orthopnea, lower extremity edema.  However does describe sensation of "gas" pressure across her chest which occurs both at rest and with exertion.  Had home PT today and due to desat with activity and worsening DOE called EMS and transported to ED. Increased her O2 to 4L prior to arrival with improved sat.    Review of Systems:  ROS complete and negative except as marked above   Allergies  Allergen Reactions   Benzonatate Palpitations   Lisinopril Swelling and Other (See Comments)    Patient ended up in the ED with a swollen mouth and tongue   Aspirin Nausea And Vomiting and Palpitations    Can tolerate baby aspirin 81 mg without difficulty but can't tolerate higher dosages   Azithromycin Palpitations   Codeine Palpitations    Prior to Admission medications   Medication Sig Start Date End Date Taking? Authorizing Provider  amLODipine (NORVASC) 5 MG tablet Take 2.5 mg by mouth in the morning. 02/08/22  Yes [provider]  aspirin EC 81 MG  tablet Take 81 mg by mouth daily. Swallow whole.   Yes [provider]  azelastine (ASTELIN) 0.1 % nasal spray Place 2 sprays into both nostrils 2 (two) times daily as needed for rhinitis. 03/08/24  Yes Ferol Luz, MD  bisoprolol (ZEBETA) 5 MG tablet Take 2.5 mg by mouth daily.   Yes [provider]  cetirizine (ZYRTEC ALLERGY) 10 MG tablet Take 1 tablet (10 mg total) by mouth daily. 03/08/24  Yes Ferol Luz, MD  empagliflozin (JARDIANCE) 25 MG TABS tablet Take 25 mg by mouth daily at 6 PM. 02/08/22  Yes [provider]  ferrous sulfate 325 (65 FE) MG tablet Take 325 mg by mouth daily with breakfast.   Yes [provider]  fluticasone (FLONASE) 50 MCG/ACT nasal spray Place 1 spray into both nostrils daily. Patient taking differently: Place 1 spray into both nostrils daily as needed for allergies. 03/08/24  Yes Ferol Luz, MD  glipiZIDE (GLUCOTROL XL) 5 MG 24 hr tablet Take 1 tablet (5 mg total) by mouth daily with breakfast. 02/18/24  Yes Burnadette Pop, MD  metFORMIN (GLUCOPHAGE-XR) 500 MG 24 hr tablet Take 1,000 mg by mouth in the morning and at bedtime. 02/08/22  Yes [provider]  pravastatin (PRAVACHOL) 80 MG tablet Take 80 mg by mouth at bedtime.   Yes [provider]  sucralfate (CARAFATE) 1 g tablet TAKE 1 TABLET(1 GRAM) BY MOUTH FOUR TIMES DAILY(WITH MEALS  AND AT BEDTIME) Patient taking differently: Take 1 g by mouth 4 (four) times daily as needed. 01/10/24  Yes Heilingoetter, Cassandra L, PA-C  doxycycline (VIBRA-TABS) 100 MG tablet Take 1 tablet (100 mg total) by mouth 2 (two) times daily. Patient not taking: Reported on 04/06/2024 03/21/24   Tomma Lightning, MD  guaiFENesin (MUCINEX) 600 MG 12 hr tablet Take 1 tablet (600 mg total) by mouth 2 (two) times daily. Patient not taking: Reported on 04/06/2024 03/07/24   Heilingoetter, Cassandra L, PA-C  guaiFENesin-dextromethorphan (ROBITUSSIN DM) 100-10 MG/5ML syrup Take 5 mLs  by mouth every 4 (four) hours as needed for cough. Patient not taking: Reported on 04/06/2024 03/22/24   Tomma Lightning, MD  nystatin (MYCOSTATIN/NYSTOP) powder Apply 1 Application topically 3 (three) times daily. Patient not taking: Reported on 04/06/2024 12/15/23   Heilingoetter, Cassandra L, PA-C  pantoprazole (PROTONIX) 40 MG tablet Take 1 tablet (40 mg total) by mouth daily at 12 noon. Patient not taking: Reported on 04/06/2024 02/18/24   Burnadette Pop, MD  polyethylene glycol (MIRALAX / GLYCOLAX) 17 g packet Take 17 g packet by mouth daily. Patient not taking: Reported on 04/06/2024 02/19/24   Burnadette Pop, MD  potassium chloride SA (KLOR-CON M) 20 MEQ tablet Take 2 tablets (40 mEq total) by mouth daily for 3 doses. 02/19/24 02/22/24  Burnadette Pop, MD  predniSONE (DELTASONE) 20 MG tablet Take 1 tablet (20 mg total) by mouth daily with breakfast. Patient not taking: Reported on 04/06/2024 03/19/24   Tomma Lightning, MD  prochlorperazine (COMPAZINE) 10 MG tablet TAKE 1 TABLET(10 MG) BY MOUTH EVERY 6 HOURS AS NEEDED Patient not taking: Reported on 04/06/2024 11/11/23   Heilingoetter, Johnette Abraham, PA-C    Past Medical History:  Diagnosis Date   Angioedema 10/17/2023   Arthritis    Diabetes mellitus without complication (HCC)    Hypertension     Past Surgical History:  Procedure Laterality Date   BRONCHIAL BIOPSY  03/30/2022   Procedure: BRONCHIAL BIOPSIES;  Surgeon: Josephine Igo, DO;  Location: [facility] ENDOSCOPY;  Service: Pulmonary;;   BRONCHIAL BIOPSY  10/17/2023   Procedure: BRONCHIAL BIOPSIES;  Surgeon: Leslye Peer, MD;  Location: [facility] ENDOSCOPY;  Service: Pulmonary;;   BRONCHIAL BRUSHINGS  03/30/2022   Procedure: BRONCHIAL BRUSHINGS;  Surgeon: Josephine Igo, DO;  Location: [facility] ENDOSCOPY;  Service: Pulmonary;;   BRONCHIAL BRUSHINGS  10/17/2023   Procedure: BRONCHIAL BRUSHINGS;  Surgeon: Leslye Peer, MD;  Location: [facility] ENDOSCOPY;  Service: Pulmonary;;   BRONCHIAL NEEDLE  ASPIRATION BIOPSY  03/30/2022   Procedure: BRONCHIAL NEEDLE ASPIRATION BIOPSIES;  Surgeon: Josephine Igo, DO;  Location: [facility] ENDOSCOPY;  Service: Pulmonary;;   BRONCHIAL NEEDLE ASPIRATION BIOPSY  10/17/2023   Procedure: BRONCHIAL NEEDLE ASPIRATION BIOPSIES;  Surgeon: Leslye Peer, MD;  Location: [facility] ENDOSCOPY;  Service: Pulmonary;;   EYE SURGERY     FIDUCIAL MARKER PLACEMENT  03/30/2022   Procedure: FIDUCIAL MARKER PLACEMENT;  Surgeon: Josephine Igo, DO;  Location: [facility] ENDOSCOPY;  Service: Pulmonary;;   FINE NEEDLE ASPIRATION  10/17/2023   Procedure: FINE NEEDLE ASPIRATION;  Surgeon: Leslye Peer, MD;  Location: [facility] ENDOSCOPY;  Service: Pulmonary;;   FOREIGN BODY REMOVAL  03/30/2022   Procedure: FOREIGN BODY REMOVAL;  Surgeon: Josephine Igo, DO;  Location: [facility] ENDOSCOPY;  Service: Pulmonary;;   HEMOSTASIS CONTROL  10/17/2023   Procedure: HEMOSTASIS CONTROL;  Surgeon: Leslye Peer, MD;  Location: [facility] ENDOSCOPY;  Service: Pulmonary;;   VIDEO BRONCHOSCOPY WITH ENDOBRONCHIAL ULTRASOUND Bilateral  03/30/2022   Procedure: VIDEO BRONCHOSCOPY WITH ENDOBRONCHIAL ULTRASOUND;  Surgeon: Josephine Igo, DO;  Location: [facility] ENDOSCOPY;  Service: Pulmonary;  Laterality: Bilateral;   VIDEO BRONCHOSCOPY WITH ENDOBRONCHIAL ULTRASOUND Right 10/17/2023   Procedure: VIDEO BRONCHOSCOPY WITH ENDOBRONCHIAL ULTRASOUND;  Surgeon: Leslye Peer, MD;  Location: [facility] ENDOSCOPY;  Service: Pulmonary;  Laterality: Right;   VIDEO BRONCHOSCOPY WITH RADIAL ENDOBRONCHIAL ULTRASOUND  03/30/2022   Procedure: VIDEO BRONCHOSCOPY WITH RADIAL ENDOBRONCHIAL ULTRASOUND;  Surgeon: Josephine Igo, DO;  Location: [facility] ENDOSCOPY;  Service: Pulmonary;;     reports that she quit smoking about 15 months ago. Her smoking use included cigarettes. She has been exposed to tobacco smoke. She has never used smokeless tobacco. She reports that she does not drink alcohol and does not use drugs.  Family History  Problem Relation Age of Onset    Allergic rhinitis Sister    Asthma Sister    Angioedema Neg Hx    Atopy Neg Hx    Immunodeficiency Neg Hx    Urticaria Neg Hx    Eczema Neg Hx      Physical Exam: Vitals:   04/06/24 1830 04/06/24 1900 04/06/24 2000 04/06/24 2100  BP: 137/86 (!) 142/75 (!) 129/90 136/79  Pulse: 89 96 (!) 103 99  Resp: (!) 30 (!) 22 (!) 23 (!) 22  Temp:    98 F (36.7 C)  SpO2: 98% 99% 96% 96%    Gen: Awake, alert, NAD, chronically ill-appearing CV: Regular, normal S1, S2, no murmurs  Resp: Mild increased work of breathing, tachypneic, on 4 L [place], Rales throughout the right lung fields, diminished air movement Abd: Flat, normoactive, nontender MSK: Symmetric, no edema  Skin: No rashes or lesions to exposed skin  Neuro: Alert and interactive  Psych: euthymic, appropriate    Data review:   Labs reviewed, notable for:   Lactate 1.1 NA 1 3, K3.1, bicarb 20, no anion gap, mag 1 BNP 141, high-sensitivity Trop 117 -> 107 WBC 6 Flu/COVID/RSV negative  Micro:  Results for orders placed or performed during the hospital encounter of 04/06/24  Resp panel by RT-PCR (RSV, Flu A&B, Covid)     Status: None   Collection Time: 04/06/24  6:07 PM   Specimen: Nasal Swab  Result Value Ref Range Status   SARS Coronavirus 2 by RT PCR NEGATIVE NEGATIVE Final    Comment: (NOTE) SARS-CoV-2 target nucleic acids are NOT DETECTED.  The SARS-CoV-2 RNA is generally detectable in upper respiratory specimens during the acute phase of infection. The lowest concentration of SARS-CoV-2 viral copies this assay can detect is 138 copies/mL. A negative result does not preclude SARS-Cov-2 infection and should not be used as the sole basis for treatment or other patient management decisions. A negative result may occur with  improper specimen collection/handling, submission of specimen other than nasopharyngeal swab, presence of viral mutation(s) within the areas targeted by this assay, and inadequate number of  viral copies(<138 copies/mL). A negative result must be combined with clinical observations, patient history, and epidemiological information. The expected result is Negative.  Fact Sheet for Patients:  BloggerCourse.com  Fact Sheet for Healthcare Providers:  SeriousBroker.it  This test is no t yet approved or cleared by the [place] FDA and  has been authorized for detection and/or diagnosis of SARS-CoV-2 by FDA under an Emergency Use Authorization (EUA). This EUA will remain  in effect (meaning this test can be used) for the duration of the COVID-19 declaration under Section 564(b)(1) of the Act, 21  U.S.C.section 360bbb-3(b)(1), unless the authorization is terminated  or revoked sooner.       Influenza A by PCR NEGATIVE NEGATIVE Final   Influenza B by PCR NEGATIVE NEGATIVE Final    Comment: (NOTE) The Xpert Xpress SARS-CoV-2/FLU/RSV plus assay is intended as an aid in the diagnosis of influenza from Nasopharyngeal swab specimens and should not be used as a sole basis for treatment. Nasal washings and aspirates are unacceptable for Xpert Xpress SARS-CoV-2/FLU/RSV testing.  Fact Sheet for Patients: BloggerCourse.com  Fact Sheet for Healthcare Providers: SeriousBroker.it  This test is not yet approved or cleared by the [place] FDA and has been authorized for detection and/or diagnosis of SARS-CoV-2 by FDA under an Emergency Use Authorization (EUA). This EUA will remain in effect (meaning this test can be used) for the duration of the COVID-19 declaration under Section 564(b)(1) of the Act, 21 U.S.C. section 360bbb-3(b)(1), unless the authorization is terminated or revoked.     Resp Syncytial Virus by PCR NEGATIVE NEGATIVE Final    Comment: (NOTE) Fact Sheet for Patients: BloggerCourse.com  Fact Sheet for Healthcare  Providers: SeriousBroker.it  This test is not yet approved or cleared by the [place] FDA and has been authorized for detection and/or diagnosis of SARS-CoV-2 by FDA under an Emergency Use Authorization (EUA). This EUA will remain in effect (meaning this test can be used) for the duration of the COVID-19 declaration under Section 564(b)(1) of the Act, 21 U.S.C. section 360bbb-3(b)(1), unless the authorization is terminated or revoked.  Performed at [facility], [address], [place], [place] [address]     Imaging reviewed:  [facility] Chest Port 1 View Result Date: 04/06/2024 CLINICAL DATA:  sob EXAM: PORTABLE CHEST 1 VIEW COMPARISON:  CT chest 04/04/2024, chest x-ray 03/14/2024 FINDINGS: The heart and mediastinal contours are unchanged. Atherosclerotic plaque. Interval worsening of right mid to lower lung zone airspace opacity. Chronic coarsened interstitial markings with no overt pulmonary edema. Trace right pleural effusion. No pneumothorax. No acute osseous abnormality. IMPRESSION: 1. Interval worsening of right mid to lower lung zone airspace opacities suggestive of pneumonia. Followup PA and lateral chest X-ray is recommended in 3-4 weeks following therapy to ensure resolution and exclude underlying malignancy. 2. Trace right pleural effusion. 3. Aortic Atherosclerosis (ICD10-I70.0) and Emphysema (ICD10-J43.9). Electronically Signed   By: Tish Frederickson M.D.   On: 04/06/2024 19:22    EKG:  Personally reviewed, sinus rhythm, suspect repolarization changes with J-point notching.  No acute ischemic changes.  ED Course:  Treated with levofloxacin 750 mg IV x 1, methylprednisolone 125 mg IV x 1.  Additional treatment with potassium and magnesium.    Assessment/Plan:  73 y.o. 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   Acute on chronic hypoxic respiratory failure Worsening right-sided airspace disease History of NSCLC (SCC), s/p chemoradiation, immunotherapy  Recent hx immune related pneumonitis v radiation pneumonitis  1 week of worsening dyspnea on exertion, desat to 88% on home 2 L with activity.  Recent outpatient CT chest on 4/9 demonstrating increased confluent interstitial and parenchymal opacities in the right lungs with increasing distortion, small right pleural effusion, pleural thickening.  Underlying ILD and emphysema also noted.  On initial evaluation in field pt had increased O2 to 4L prior to EMS arrival. In ED  remains tachypneic, normal sat on 4L. Flu COVID RSV neg. WBC 6. BNP 141 and HS trop elevated 117 -> 107 but flat and suspect from demand. Chest x-ray in the ED here with similar findings to recent CT with worse airspace disease in the right lung fields.  Etiology of her progressive right-sided airspace disease question ongoing immune related pneumonitis despite stopping immunotherapy v community-acquired pneumonia, ILD flare.   -Routine pulmonology consult in the morning not contacted overnight. -S/p levofloxacin in the ED.  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 -DuoNebs every 6 hours scheduled, albuterol every 4 hours as needed, guaifenesin, incentive spirometer, flutter valve, encourage out of bed to chair. -Home O2 evaluation prior to discharge -Will need repeat imaging with CT chest after treatment of the above to rule out underlying progressive malignancy.  Acute myocardial injury No history of chest pain but  describes vague pressure sensation both at rest and with exertion.  EKG without acute ischemic changes.  High-sensitivity troponin elevated 117 -> 107 but flat and suspect from demand.  BNP 141.  Suspect demand in the setting of hypoxia at home and work of breathing. -Management directed at respiratory failure above -Continue home aspirin, GERD: Check lipids.  Recent A1c 7.4% in 2/' 25.  Hyponatremia, mild, asymptomatic - Trend BMP if worsening obtain sOsm, uosm, uNa to further eval.   Hypokalemia  Hypomagnesemia Repleted  Chronic medical problems: Hypertension: Continue home amlodipine, bisoprolol Diabetes type 2: Home regimen metformin 1 g twice daily, Jardiance 25 mg daily, glipizide 5 mg daily (recently ran out of this).  Hold home oral agents, SSI while inpatient.  Hyperlipidemia: Continue home pravastatin    There is no height or weight on file to calculate BMI.    DVT prophylaxis:  Lovenox Code Status:  DNR with Intubation; confirmed with patient  Diet:  Diet Orders (From admission, onward)     Start     Ordered   04/06/24 2201  Diet 2 gram sodium Room service appropriate? Yes; Fluid consistency: Thin  Diet effective now       Question Answer Comment  Room service appropriate? Yes   Fluid consistency: Thin      04/06/24 2205           Family Communication:  None   Consults:  None   Admission status:   Inpatient, Step Down Unit  Severity of Illness: The appropriate patient status for this patient is INPATIENT. Inpatient status is judged to be reasonable and necessary in order to provide the required intensity of service to ensure the patient's safety. The patient's presenting symptoms, physical exam findings, and initial radiographic and laboratory data in the context of their chronic comorbidities is felt to place them at high risk for further clinical deterioration. Furthermore, it is not anticipated that the patient will be medically stable for discharge from the  hospital within 2 midnights of admission.   * I certify that at the point of admission it is my clinical judgment that the patient will require inpatient hospital care spanning beyond 2 midnights from the point of admission due to high intensity of service, high risk for further deterioration and high frequency of surveillance required.*   Dolly Rias, MD [facility]  How to contact the [facility] Attending or Consulting  provider 7A - 7P or covering provider during after hours 7P -7A, for this patient.  Check the care team in [facility] and look for a) attending/consulting [facility] provider listed and b) the [facility] team listed Log into [email or web address] and use [place]'s universal password to access. If you do not have the password, please contact the hospital operator. Locate the [facility] provider you are looking for under [facility] and page to a number that you can be directly reached. If you still have difficulty reaching the provider, please page the [facility] (Director on Call) for the Hospitalists listed on amion for assistance.  04/06/2024, 10:38 PM

## 2024-04-06 NOTE — ED Provider Notes (Signed)
 [name] [facility]MENT AT [facility] Provider Note   CSN: [id number] Arrival date & time: 04/06/24  1716     History  Chief Complaint  Patient presents with   Shortness of Breath         Lindsey Williamson is a 73 y.o. female.  Pt is a 73 yo female with pmhx significant for dm, htn, arthritis, copd, and lung cancer (s/p radiation and chemo).  Pt was admitted in Feb for pna and was d/c with oxygen.  She now uses 2L oxygen all the time.  Pt said she was initially ok after d/c, but has been very sob the past few days.  She is unable to walk a few steps without getting sob.  She did have a CT chest yesterday for her cancer surveillance.  She does not know the results.       Home Medications Prior to Admission medications   Medication Sig Start Date End Date Taking? Authorizing Provider  amLODipine (NORVASC) 5 MG tablet Take 2.5 mg by mouth in the morning. 02/08/22   [provider]  aspirin EC 81 MG tablet Take 81 mg by mouth daily. Swallow whole.    [provider]  azelastine (ASTELIN) 0.1 % nasal spray Place 2 sprays into both nostrils 2 (two) times daily as needed for rhinitis. 03/08/24   Ferol Luz, MD  bisoprolol (ZEBETA) 5 MG tablet Take 2.5 mg by mouth daily.    [provider]  cetirizine (ZYRTEC ALLERGY) 10 MG tablet Take 1 tablet (10 mg total) by mouth daily. 03/08/24   Ferol Luz, MD  diclofenac Sodium (VOLTAREN) 1 % GEL Apply 2 g topically 2 (two) times daily as needed (for pain). 02/08/22   [provider]  doxycycline (VIBRA-TABS) 100 MG tablet Take 1 tablet (100 mg total) by mouth 2 (two) times daily. 03/21/24   Tomma Lightning, MD  empagliflozin (JARDIANCE) 25 MG TABS tablet Take 25 mg by mouth daily at 6 PM. 02/08/22   [provider]  ferrous sulfate 325 (65 FE) MG tablet Take 325 mg by mouth daily with breakfast.    [provider]  fluticasone (FLONASE) 50 MCG/ACT nasal spray  Place 1 spray into both nostrils daily. 03/08/24   Ferol Luz, MD  glipiZIDE (GLUCOTROL XL) 5 MG 24 hr tablet Take 1 tablet (5 mg total) by mouth daily with breakfast. 02/18/24   Burnadette Pop, MD  guaiFENesin (MUCINEX) 600 MG 12 hr tablet Take 1 tablet (600 mg total) by mouth 2 (two) times daily. 03/07/24   Heilingoetter, Cassandra L, PA-C  guaiFENesin-dextromethorphan (ROBITUSSIN DM) 100-10 MG/5ML syrup Take 5 mLs by mouth every 4 (four) hours as needed for cough. 03/22/24   Tomma Lightning, MD  metFORMIN (GLUCOPHAGE-XR) 500 MG 24 hr tablet Take 1,000 mg by mouth in the morning and at bedtime. 02/08/22   [provider]  nystatin (MYCOSTATIN/NYSTOP) powder Apply 1 Application topically 3 (three) times daily. Patient taking differently: Apply 1 Application topically 3 (three) times daily as needed (for irritation- affectefd areas). 12/15/23   Heilingoetter, Cassandra L, PA-C  omeprazole (PRILOSEC) 20 MG capsule Take 20 mg by mouth daily. 03/11/24   [provider]  pantoprazole (PROTONIX) 40 MG tablet Take 1 tablet (40 mg total) by mouth daily at 12 noon. 02/18/24   Burnadette Pop, MD  polyethylene glycol (MIRALAX / GLYCOLAX) 17 g packet Take 17 g packet by mouth daily. 02/19/24   Burnadette Pop, MD  potassium chloride  SA (KLOR-CON M) 20 MEQ tablet Take 2 tablets (40 mEq total) by mouth daily for 3 doses. 02/19/24 02/22/24  Burnadette Pop, MD  pravastatin (PRAVACHOL) 80 MG tablet Take 80 mg by mouth at bedtime.    [provider]  predniSONE (DELTASONE) 20 MG tablet Take 1 tablet (20 mg total) by mouth daily with breakfast. 03/19/24   Olalere, Adewale A, MD  prochlorperazine (COMPAZINE) 10 MG tablet TAKE 1 TABLET(10 MG) BY MOUTH EVERY 6 HOURS AS NEEDED Patient taking differently: Take 10 mg by mouth every 6 (six) hours as needed for nausea or vomiting. 11/11/23   Heilingoetter, Cassandra L, PA-C  sucralfate (CARAFATE) 1 g tablet TAKE 1 TABLET(1 GRAM) BY MOUTH FOUR TIMES  DAILY(WITH MEALS AND AT BEDTIME) 01/10/24   Heilingoetter, Cassandra L, PA-C      Allergies    Benzonatate, Lisinopril, Aspirin, Azithromycin, and Codeine    Review of Systems   Review of Systems  Respiratory:  Positive for cough and shortness of breath.   All other systems reviewed and are negative.   Physical Exam Updated Vital Signs BP (!) 142/75   Pulse 96   Temp (!) 97.5 F (36.4 C)   Resp (!) 22   SpO2 99%  Physical Exam Vitals and nursing note reviewed.  Constitutional:      Appearance: She is well-developed.  HENT:     Head: Normocephalic and atraumatic.     Mouth/Throat:     Mouth: Mucous membranes are moist.     Pharynx: Oropharynx is clear.  Eyes:     Extraocular Movements: Extraocular movements intact.     Pupils: Pupils are equal, round, and reactive to light.  Cardiovascular:     Rate and Rhythm: Normal rate and regular rhythm.  Pulmonary:     Effort: Tachypnea present.     Breath sounds: Wheezing present.  Abdominal:     General: Bowel sounds are normal.     Palpations: Abdomen is soft.  Musculoskeletal:        General: Normal range of motion.     Cervical back: Normal range of motion and neck supple.  Skin:    General: Skin is warm.     Capillary Refill: Capillary refill takes less than 2 seconds.  Neurological:     General: No focal deficit present.     Mental Status: She is alert and oriented to person, place, and time.  Psychiatric:        Mood and Affect: Mood normal.        Behavior: Behavior normal.     ED Results / Procedures / Treatments   Labs (all labs ordered are listed, but only abnormal results are displayed) Labs Reviewed  BASIC METABOLIC PANEL WITH GFR - Abnormal; Notable for the following components:      Result Value   Sodium 130 (*)    Potassium 3.1 (*)    CO2 20 (*)    Glucose, Bld 117 (*)    Calcium 8.7 (*)    All other components within normal limits  BRAIN NATRIURETIC PEPTIDE - Abnormal; Notable for the following  components:   B Natriuretic Peptide 141.6 (*)    All other components within normal limits  CBC WITH DIFFERENTIAL/PLATELET - Abnormal; Notable for the following components:   Hemoglobin 11.9 (*)    RDW 16.3 (*)    All other components within normal limits  TROPONIN I (HIGH SENSITIVITY) - Abnormal; Notable for the following components:   Troponin I (High Sensitivity) 117 (*)  All other components within normal limits  RESP PANEL BY RT-PCR (RSV, FLU A&B, COVID)  RVPGX2  CULTURE, BLOOD (ROUTINE X 2)  CULTURE, BLOOD (ROUTINE X 2)  MAGNESIUM  I-STAT CG4 LACTIC ACID, ED  TROPONIN I (HIGH SENSITIVITY)    EKG EKG Interpretation Date/Time:  Friday April 06 2024 18:03:55 EDT Ventricular Rate:  85 PR Interval:  171 QRS Duration:  75 QT Interval:  396 QTC Calculation: 471 R Axis:   76  Text Interpretation: Sinus rhythm Probable left atrial enlargement Minimal ST elevation, inferior leads Confirmed by Beckey Downing [phone]) on 04/06/2024 6:11:27 PM  Radiology DG Chest Port 1 View Result Date: 04/06/2024 CLINICAL DATA:  sob EXAM: PORTABLE CHEST 1 VIEW COMPARISON:  CT chest 04/04/2024, chest x-ray 03/14/2024 FINDINGS: The heart and mediastinal contours are unchanged. Atherosclerotic plaque. Interval worsening of right mid to lower lung zone airspace opacity. Chronic coarsened interstitial markings with no overt pulmonary edema. Trace right pleural effusion. No pneumothorax. No acute osseous abnormality. IMPRESSION: 1. Interval worsening of right mid to lower lung zone airspace opacities suggestive of pneumonia. Followup PA and lateral chest X-ray is recommended in 3-4 weeks following therapy to ensure resolution and exclude underlying malignancy. 2. Trace right pleural effusion. 3. Aortic Atherosclerosis (ICD10-I70.0) and Emphysema (ICD10-J43.9). Electronically Signed   By: Tish Frederickson M.D.   On: 04/06/2024 19:22    Procedures Procedures    Medications Ordered in ED Medications   levofloxacin (LEVAQUIN) IVPB 750 mg (0 mg Intravenous Stopped 04/06/24 2015)  methylPREDNISolone sodium succinate (SOLU-MEDROL) 125 mg/2 mL injection 125 mg (125 mg Intravenous Given 04/06/24 1831)  ipratropium-albuterol (DUONEB) 0.5-2.5 (3) MG/3ML nebulizer solution 3 mL (3 mLs Nebulization Given 04/06/24 1836)  potassium chloride SA (KLOR-CON M) CR tablet 40 mEq (40 mEq Oral Given 04/06/24 2042)    ED Course/ Medical Decision Making/ A&P                                 Medical Decision Making Amount and/or Complexity of Data Reviewed Labs: ordered. Radiology: ordered.  Risk Prescription drug management. Decision regarding hospitalization.   This patient presents to the ED for concern of sob, this involves an extensive number of treatment options, and is a complaint that carries with it a high risk of complications and morbidity.  The differential diagnosis includes copd, chf, covid/flu/rsv, pna   Co morbidities that complicate the patient evaluation  dm, htn, arthritis, copd, and lung cancer (s/p radiation and chemo)   Additional history obtained:  Additional history obtained from epic chart review  Lab Tests:  I Ordered, and personally interpreted labs.  The pertinent results include:  cbc nl, bmp nl other than na low at 130 and k low at 3.1; trop elevated at 117, bnp elevated at 141.6; covid/flu/rsv neg   Imaging Studies ordered:  I ordered imaging studies including cxr  I independently visualized and interpreted imaging which showed   Interval worsening of right mid to lower lung zone airspace  opacities suggestive of pneumonia. Followup PA and lateral chest  X-ray is recommended in 3-4 weeks following therapy to ensure  resolution and exclude underlying malignancy.  2. Trace right pleural effusion.  3. Aortic Atherosclerosis (ICD10-I70.0) and Emphysema (ICD10-J43.9).   CT chest done on 4/9 reviewed: Increasing confluent interstitial and parenchymal opacities in  the right lung with increasing distortion there is also a increasing small right pleural effusion and some increasing pleural thickening.  Extensive underlying interstitial fibrotic changes the lungs with honeycombing, traction bronchiectasis and underlying emphysematous lung changes.   Persistent diffuse adenopathy in the mediastinum and hilum, similar to previous.   Aortic Atherosclerosis (ICD10-I70.0) and Emphysema (ICD10-J43.9). I agree with the radiologist interpretation   Cardiac Monitoring:  The patient was maintained on a cardiac monitor.  I personally viewed and interpreted the cardiac monitored which showed an underlying rhythm of: nsr   Medicines ordered and prescription drug management:  I ordered medication including levaquin, duoneb, solumedrol  for sx  Reevaluation of the patient after these medicines showed that the patient improved I have reviewed the patients home medicines and have made adjustments as needed   Test Considered:  ct   Critical Interventions:  Nebs/abx   Consultations Obtained:  I requested consultation with the hospitalist (Dr. Lazarus Salines),  and discussed lab and imaging findings as well as pertinent plan -he will admit   Problem List / ED Course:  COPD exacerbation:  nebs/solumedrol given.  Sob has improved, but pt is still sob with movement CAP:  levaquin given.  Blood cx drawn Hypokalemia:  kdur given Elevated trop:  likely due to strain from work of breathing.  No cp.    Reevaluation:  After the interventions noted above, I reevaluated the patient and found that they have :improved   Social Determinants of Health:  Lives at home   Dispostion:  After consideration of the diagnostic results and the patients response to treatment, I feel that the patent would benefit from admission.          Final Clinical Impression(s) / ED Diagnoses Final diagnoses:  COPD exacerbation (HCC)  Community acquired pneumonia of  right lower lobe of lung  Hypokalemia  Elevated troponin    Rx / DC Orders ED Discharge Orders     None         Jacalyn Lefevre, MD 04/06/24 2051

## 2024-04-06 NOTE — Progress Notes (Signed)
 Pharmacy Antibiotic Note  Lindsey Williamson is a 73 y.o. female admitted on 04/06/2024 with pneumonia.  In the ED patient received levaquin 750mg  IV x 1 dose.  Pharmacy has been consulted for Vancomycin dosing.  Plan: Vancomycin 1250mg  IV Q 24 hrs. Goal AUC 400-550.  Expected AUC: 536.1  SCr used: 0.8 (rounded up from 0.54) Cefepime 2g IV q12h per MD Follow renal function F/u culture results & sensitivities     Temp (24hrs), Avg:97.8 F (36.6 C), Min:97.5 F (36.4 C), Max:98 F (36.7 C)  Recent Labs  Lab 04/06/24 1807 04/06/24 1817  WBC 6.0  --   CREATININE 0.54  --   LATICACIDVEN  --  1.1    Estimated Creatinine Clearance: 58.4 mL/min (by C-G formula based on SCr of 0.54 mg/dL).    Allergies  Allergen Reactions   Benzonatate Palpitations   Lisinopril Swelling and Other (See Comments)    Patient ended up in the ED with a swollen mouth and tongue   Aspirin Nausea And Vomiting and Palpitations    Can tolerate baby aspirin 81 mg without difficulty but can't tolerate higher dosages   Azithromycin Palpitations   Codeine Palpitations    Antimicrobials this admission: 4/11 Levaquin x 1 4/11 Vancomycin >>   4/12 Cefepime >>  Dose adjustments this admission:    Microbiology results:  4/11 BCx:      Thank you for allowing pharmacy to be a part of this patient's care.  Maryellen Pile, PharmD 04/06/2024 10:12 PM

## 2024-04-06 NOTE — ED Triage Notes (Signed)
 Pt presents to ED for worsening SOB following recent PNA diagnosis and treatment. Pt states she has stage 3 lung cancer. Pt wears 4L [place] baseline

## 2024-04-07 ENCOUNTER — Encounter (HOSPITAL_COMMUNITY): Payer: Self-pay | Admitting: Internal Medicine

## 2024-04-07 DIAGNOSIS — J984 Other disorders of lung: Secondary | ICD-10-CM | POA: Diagnosis not present

## 2024-04-07 DIAGNOSIS — R918 Other nonspecific abnormal finding of lung field: Secondary | ICD-10-CM

## 2024-04-07 DIAGNOSIS — J9621 Acute and chronic respiratory failure with hypoxia: Secondary | ICD-10-CM | POA: Diagnosis not present

## 2024-04-07 LAB — RESPIRATORY PANEL BY PCR

## 2024-04-07 LAB — BASIC METABOLIC PANEL WITH GFR
Anion gap: 9 (ref 5–15)
BUN: 16 mg/dL (ref 8–23)
CO2: 20 mmol/L — ABNORMAL LOW (ref 22–32)
Calcium: 8.9 mg/dL (ref 8.9–10.3)
Chloride: 104 mmol/L (ref 98–111)
Creatinine, Ser: 0.64 mg/dL (ref 0.44–1.00)
GFR, Estimated: >60 mL/min (ref >60)
Glucose, Bld: 235 mg/dL — ABNORMAL HIGH (ref 70–99)
Potassium: 4.5 mmol/L (ref 3.5–5.1)
Sodium: 133 mmol/L — ABNORMAL LOW (ref 135–145)

## 2024-04-07 LAB — LIPID PANEL
Cholesterol: 117 mg/dL (ref 0–200)
HDL: 46 mg/dL (ref >40)
LDL Cholesterol: 60 mg/dL (ref 0–99)
Total CHOL/HDL Ratio: 2.5 ratio
Triglycerides: 54 mg/dL (ref <150)
VLDL: 11 mg/dL (ref 0–40)

## 2024-04-07 LAB — GLUCOSE, CAPILLARY
Glucose-Capillary: 218 mg/dL — ABNORMAL HIGH (ref 70–99)
Glucose-Capillary: 180 mg/dL — ABNORMAL HIGH (ref 70–99)
Glucose-Capillary: 254 mg/dL — ABNORMAL HIGH (ref 70–99)

## 2024-04-07 LAB — TROPONIN I (HIGH SENSITIVITY): Troponin I (High Sensitivity): 43 ng/L — ABNORMAL HIGH (ref <18)

## 2024-04-07 LAB — MAGNESIUM: Magnesium: 2.4 mg/dL (ref 1.7–2.4)

## 2024-04-07 LAB — CBG MONITORING, ED
Glucose-Capillary: 208 mg/dL — ABNORMAL HIGH (ref 70–99)
Glucose-Capillary: 323 mg/dL — ABNORMAL HIGH (ref 70–99)

## 2024-04-07 LAB — CBC
HCT: 35 % — ABNORMAL LOW (ref 36.0–46.0)
Hemoglobin: 10.9 g/dL — ABNORMAL LOW (ref 12.0–15.0)
MCH: 27.4 pg (ref 26.0–34.0)
MCHC: 31.1 g/dL (ref 30.0–36.0)
MCV: 87.9 fL (ref 80.0–100.0)
Platelets: 302 10*3/uL (ref 150–400)
RBC: 3.98 MIL/uL (ref 3.87–5.11)
RDW: 16.1 % — ABNORMAL HIGH (ref 11.5–15.5)
WBC: 3.5 10*3/uL — ABNORMAL LOW (ref 4.0–10.5)
nRBC: 0 % (ref 0.0–0.2)

## 2024-04-07 LAB — PROCALCITONIN: Procalcitonin: <0.1 ng/mL

## 2024-04-07 LAB — SEDIMENTATION RATE: Sed Rate: 70 mm/h — ABNORMAL HIGH (ref 0–22)

## 2024-04-07 LAB — C-REACTIVE PROTEIN: CRP: 4.8 mg/dL — ABNORMAL HIGH (ref <1.0)

## 2024-04-07 LAB — MRSA NEXT GEN BY PCR, NASAL: MRSA by PCR Next Gen: NOT DETECTED

## 2024-04-07 LAB — PHOSPHORUS: Phosphorus: 3.8 mg/dL (ref 2.5–4.6)

## 2024-04-07 MED ORDER — HYDRALAZINE HCL 25 MG PO TABS
25.0000 mg | ORAL_TABLET | Freq: Three times a day (TID) | ORAL | Status: DC | PRN
  Administered 2024-04-07: 25 mg via ORAL
  Filled 2024-04-07: qty 1

## 2024-04-07 MED ORDER — INSULIN ASPART 100 UNIT/ML IJ SOLN
0.0000 [IU] | Freq: Three times a day (TID) | INTRAMUSCULAR | Status: DC
  Administered 2024-04-07: 7 [IU] via SUBCUTANEOUS
  Administered 2024-04-07: 2 [IU] via SUBCUTANEOUS
  Administered 2024-04-07: 3 [IU] via SUBCUTANEOUS
  Administered 2024-04-08: 7 [IU] via SUBCUTANEOUS
  Administered 2024-04-08: 2 [IU] via SUBCUTANEOUS
  Administered 2024-04-08 – 2024-04-09 (×2): 3 [IU] via SUBCUTANEOUS
  Administered 2024-04-09: 7 [IU] via SUBCUTANEOUS
  Administered 2024-04-09: 2 [IU] via SUBCUTANEOUS
  Administered 2024-04-10: 5 [IU] via SUBCUTANEOUS
  Administered 2024-04-10: 1 [IU] via SUBCUTANEOUS
  Administered 2024-04-11: 3 [IU] via SUBCUTANEOUS
  Filled 2024-04-07: qty 0.09

## 2024-04-07 MED ORDER — IPRATROPIUM-ALBUTEROL 0.5-2.5 (3) MG/3ML IN SOLN
3.0000 mL | Freq: Three times a day (TID) | RESPIRATORY_TRACT | Status: DC
  Administered 2024-04-07 – 2024-04-09 (×5): 3 mL via RESPIRATORY_TRACT
  Filled 2024-04-07 (×5): qty 3

## 2024-04-07 MED ORDER — MELATONIN 5 MG PO TABS
5.0000 mg | ORAL_TABLET | Freq: Every evening | ORAL | Status: AC | PRN
  Administered 2024-04-07 – 2024-04-09 (×3): 5 mg via ORAL
  Filled 2024-04-07 (×2): qty 1

## 2024-04-07 MED ORDER — CHLORHEXIDINE GLUCONATE CLOTH 2 % EX PADS
6.0000 | MEDICATED_PAD | Freq: Every day | CUTANEOUS | Status: DC
  Administered 2024-04-07 – 2024-04-11 (×4): 6 via TOPICAL

## 2024-04-07 MED ORDER — INSULIN ASPART 100 UNIT/ML IJ SOLN
0.0000 [IU] | Freq: Every day | INTRAMUSCULAR | Status: DC
  Administered 2024-04-07: 3 [IU] via SUBCUTANEOUS
  Administered 2024-04-08: 5 [IU] via SUBCUTANEOUS
  Administered 2024-04-09: 2 [IU] via SUBCUTANEOUS
  Administered 2024-04-10: 3 [IU] via SUBCUTANEOUS
  Filled 2024-04-07: qty 0.05

## 2024-04-07 MED ORDER — SODIUM CHLORIDE 0.9 % IV SOLN
2.0000 g | INTRAVENOUS | Status: DC
  Administered 2024-04-07: 2 g via INTRAVENOUS
  Filled 2024-04-07: qty 20

## 2024-04-07 MED ORDER — METHYLPREDNISOLONE SODIUM SUCC 125 MG IJ SOLR
60.0000 mg | Freq: Every day | INTRAMUSCULAR | Status: DC
  Administered 2024-04-08 – 2024-04-09 (×2): 60 mg via INTRAVENOUS
  Filled 2024-04-07 (×2): qty 2

## 2024-04-07 MED ORDER — ORAL CARE MOUTH RINSE: 15.0000 mL | OROMUCOSAL | Status: DC | PRN

## 2024-04-07 NOTE — Hospital Course (Addendum)
 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  Upon admission found to have concerns of radiation pneumonitis versus underlying infection.  Started on antibiotics, steroids and bronchodilators.  PCCM consulted as well. After several days patient's condition remained stable and it was determined to discharge her home with prolonged course of steroid, bronchodilator and prophylactic course of Bactrim.  She will also require 2 L of nasal cannula at home. Otherwise she is medically stable for discharge Her husband has been updated by me on the day of discharge  Assessment & Plan:  Principal Problem:   Pneumonitis Active Problems:   Acute on chronic hypoxic respiratory failure (HCC)   ILD (interstitial lung disease) (HCC)   COPD (chronic obstructive pulmonary disease) (HCC)   NSCLC of right lung (HCC)    Acute on chronic hypoxic respiratory failure- on 2l [place] at home Worsening right-sided airspace disease NSCLC s/p chemoradiation, immunotherapy  Recent hx immune related pneumonitis v radiation pneumonitis: COVID, RSV, flu, Aspergillus is all negative.  Unfortunately most of her condition is secondary to her underlying malignancy followed by chemo/radiation and immunotherapy.  Unlikely this will be reversible.  She will continue to require some form of oxygen at home between 2-4 L nasal cannula. - Will discharge her on prolonged course of prednisone.  40 mg for 2 weeks followed by 10 mg taper every weekly thereafter continue 10 mg daily.  Bronchodilators have been prescribed.  Prophylactic Bactrim 3 times weekly prescribed - Seen by  pulmonary   Elevated troponin-from demand ischemia due to hypoxia CAD s/p CABG Hyperlipidemia: Suspect demand ischemia.  Admitting troponin 117 which has trended down.  EKG is nonischemic.  Echocardiogram February 2024 shows EF 65 to 70%.  Prior provider discussed case with cardiology, continue to monitor. - Continue aspirin, statin, Zebeta   Hyponatremia Hypokalemia Hypomagnesemia: Replaced and resolved.   Hypertension: BP stable on home amlodipine bisoprolol IV as needed   Type 2 diabetes mellitus w/ uncontrolled hyperglycemia: Resume metformin, Jardiance and glipizide.  I do expect some episodes of hyperglycemia as patient will be on steroid therefore I will prescribe corrective scale with instructions  PT/OT  DVT prophylaxis: enoxaparin (LOVENOX) injection 40 mg Start: 04/07/24 1000      Code Status: Do not attempt resuscitation (DNR) PRE-ARREST INTERVENTIONS DESIRED Family Communication: Home Status is: Inpatient Remains inpatient appropriate because: Discharge today  Subjective:  No complaints.  Occasional anxiety given her prognosis  Examination:  General exam: Appears calm and comfortable, 2 L nasal cannula Respiratory system: Diminished breath sounds bilateral, improved and stable Cardiovascular system: S1 & S2 heard, RRR. No JVD, murmurs, rubs, gallops or clicks. No pedal edema. Gastrointestinal system: Abdomen is nondistended, soft and nontender. No organomegaly or masses felt. Normal bowel sounds heard. Central nervous system: Alert and oriented. No focal neurological deficits. Extremities: Symmetric 5 x 5 power. Skin: No rashes, lesions or ulcers Psychiatry: Judgement and insight appear normal. Mood & affect appropriate.

## 2024-04-07 NOTE — Progress Notes (Signed)
 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 O2 sat have been less than 88% while on her home 2 L with activity.also complains of gas pressure, cough In ED-Aebrile, mild tachypnea BP stable noted to be placed on 4 L [place], labs with hyponatremia hypokalemia bnp 141, Hs troponins 117>107, LA 1.1, stable CBC, COVID RSV influenza negative. CXR- "Interval worsening of right mid to lower lung zone airspace opacities suggestive of pneumonia" CT chest w/ outpatient on 4/9- "Increasing confluent interstitial and parenchymal opacities in the right lung with increasing distortion there is also a increasing small right pleural effusion and some increasing pleural thickening. Extensive underlying interstitial fibrotic changes the lungs with honeycombing, traction bronchiectasis and underlying emphysematous lung changes" Patient was placed on Solu-Medrol 5 antibiotics and admission requested.  Subjective: Seen this am Feels some better Little short of breath on using bedside commode Has cough on deep breath No chest pain Remains afebrile, BP stable mild tachypnea 20s, on 4 L [place]  Assessment and plan: Acute on chronic hypoxic respiratory failure- on 2l [place] at home Worsening right-sided airspace disease NSCLC s/p chemoradiation, immunotherapy  Recent hx immune related pneumonitis v radiation pneumonitis: Follow-up RVP sputum culture Fungitell Aspergillus antigen, Continue current Solu-Medrol vancomycin/cefepime, levaquin,  supplemental oxygen, bronchodilators antitussives Consulted PCCM for further management.  Elevated troponin-from demand ischemia due to hypoxia CAD s/p CABG Hyperlipidemia: Hs troponins 117>107, EKG without ischemic changes, suspect demand ischemia due to hypoxia #1, recent echo from 02/15/2024-EF 65 to 70%.  On a statin bisoprolol.  Discussed with Dr. Karyl Paget from cardiology given pulmonary complaints -next likely cardiac etiology will check troponin this am at 40.  Hyponatremia Hypokalemia Hypomagnesemia: Replaced and resolved.  Hypertension: BP stable on home amlodipine bisoprolol  Type 2 diabetes mellitus w/ uncontrolled hyperglycemia: Holding metformin Jardiance glipizide continue SSI for now while on steroid. Recent Labs  Lab 04/07/24 0900  GLUCAP 208*        DVT prophylaxis: enoxaparin (LOVENOX) injection 40 mg Start: 04/07/24 1000 Code Status:   Code Status: Do not attempt resuscitation (DNR) PRE-ARREST INTERVENTIONS DESIRED Family Communication: plan of care discussed with patient at bedside. Patient status is: Remains hospitalized because of severity of illness Level of care: Stepdown   Dispo: The patient is from: home w/ husband  Anticipated disposition: TBD  Objective: Vitals last 24 hrs: Vitals:   04/07/24 0436 04/07/24 0600 04/07/24 0718 04/07/24 0800  BP: (!) 151/85 (!) 142/82 (!) 151/85 (!) 154/86  Pulse: 84 89 88 83  Resp: (!) 21 (!) 22 17 18   Temp: 98.3 F (36.8 C)   98.5 F (36.9 C)  TempSrc: Oral   Oral  SpO2: 100% 100% 99% 99%  Weight:      Height:       Weight change:   Physical Examination: General exam: alert awake, older than stated age HEENT:Oral mucosa moist, Ear/Nose WNL grossly Respiratory system: Bilaterally crackles, no use of accessory muscle Cardiovascular system: S1 & S2 +. Gastrointestinal system: Abdomen soft, NT,ND,BS+ Nervous System: Alert, awake,following commands. Extremities: LE edema neg, moving arms, warm legs Skin:  No rashes,warm.  MSK: Normal muscle bulk/tone.   Medications reviewed:  Scheduled Meds:  amLODipine  2.5 mg Oral Daily   aspirin EC  81 mg Oral Daily   bisoprolol  2.5 mg Oral Daily   enoxaparin (LOVENOX) injection  40 mg Subcutaneous Q24H   ferrous sulfate  325 mg Oral Q breakfast   guaiFENesin  600 mg Oral BID   insulin aspart  0-5 Units Subcutaneous QHS   insulin aspart  0-9 Units Subcutaneous TID WC   ipratropium-albuterol  3 mL Nebulization Q6H   loratadine  10 mg Oral Daily   methylPREDNISolone (SOLU-MEDROL) injection  60 mg Intravenous Q12H   pravastatin  80 mg Oral QHS   sodium chloride flush  3 mL Intravenous Q12H   Continuous Infusions:  ceFEPime (MAXIPIME) IV     levofloxacin (LEVAQUIN) IV Stopped (04/06/24 2015)   vancomycin        Diet Order             Diet 2 gram sodium Room service appropriate? Yes; Fluid consistency: Thin  Diet effective now                         No intake or output data in the 24 hours ending 04/07/24 0954 Net IO Since Admission: No IO data has been entered for this period [04/07/24 0954]  Wt Readings from Last 3 Encounters:  04/07/24 66 kg  04/02/24 65.7 kg  03/14/24 65.6 kg    Unresulted Labs (From admission, onward)     Start     Ordered   04/07/24 0500  Procalcitonin  Tomorrow morning,   R       References:    Procalcitonin Lower Respiratory Tract Infection AND Sepsis Procalcitonin Algorithm   04/06/24 2205   04/07/24 0500  Fungitell Beta-D-Glucan  Tomorrow morning,   R        04/06/24 2258   04/07/24 0500  Aspergillus Ag, BAL/Serum  Once,   R        04/06/24 2258   04/06/24 2205  Expectorated Sputum Assessment w Gram Stain, Rflx to Resp Cult  Once,   R        04/06/24 2205   04/06/24 2205  MRSA Next Gen by PCR, Nasal  Once,   R        04/06/24 2205          Data Reviewed: I have personally reviewed following labs and imaging studies ( see epic result tab) CBC: Recent Labs  Lab 04/06/24 1807 04/07/24 0436   WBC 6.0 3.5*  NEUTROABS 4.2  --   HGB 11.9* 10.9*  HCT 38.6 35.0*  MCV 87.5 87.9  PLT 334 302   CMP: Recent Labs  Lab 04/06/24 1807 04/06/24 2043 04/07/24 0436  NA 130*  --  133*  K 3.1*  --  4.5  CL 100  --  104  CO2 20*  --  20*  GLUCOSE 117*  --  235*  BUN 11  --  16  CREATININE 0.54  --  0.64  CALCIUM 8.7*  --  8.9  MG  --  1.0* 2.4  PHOS  --   --  3.8   GFR: Estimated Creatinine Clearance: 58.5 mL/min (by C-G formula based on SCr of 0.64 mg/dL). Recent Labs  Lab 04/07/24 0900  GLUCAP 208*   Recent Labs    04/07/24 0436  CHOL 117  HDL 46  LDLCALC 60  TRIG 54  CHOLHDL 2.5  No results for input(s): "TSH", "T4TOTAL", "FREET4", "T3FREE", "THYROIDAB" in the last 72 hours. Sepsis Labs: Recent Labs  Lab 04/06/24 1817  LATICACIDVEN 1.1   Recent Results (from the past 240 hours)  Culture, blood (routine x 2)     Status: None (Preliminary result)   Collection Time: 04/06/24  6:05 PM   Specimen: BLOOD  Result Value Ref Range Status   Specimen Description   Final    BLOOD RIGHT ANTECUBITAL Performed at [facility], [address] [place], [place] [address]    Special Requests   Final    BOTTLES DRAWN AEROBIC AND ANAEROBIC Blood Culture adequate volume Performed at [facility], [address], [place], [place] [address]    Culture   Final    NO GROWTH < 12 HOURS Performed at [facility], [address], [place], [place] [address]    Report Status PENDING  Incomplete  Culture, blood (routine x 2)     Status: None (Preliminary result)   Collection Time: 04/06/24  6:07 PM   Specimen: BLOOD  Result Value Ref Range Status   Specimen Description   Final    BLOOD BLOOD LEFT WRIST Performed at [facility], [address] [place], [place] [address]    Special Requests   Final    BOTTLES DRAWN AEROBIC AND ANAEROBIC Blood Culture adequate volume Performed at [facility],  [address] [place], [place] [address]    Culture   Final    NO GROWTH < 12 HOURS Performed at [facility], [address], [place], [place] [address]    Report Status PENDING  Incomplete  Resp panel by RT-PCR (RSV, Flu A&B, Covid)     Status: None   Collection Time: 04/06/24  6:07 PM   Specimen: Nasal Swab  Result Value Ref Range Status   SARS Coronavirus 2 by RT PCR NEGATIVE NEGATIVE Final    Comment: (NOTE) SARS-CoV-2 target nucleic acids are NOT DETECTED.  The SARS-CoV-2 RNA is generally detectable in upper respiratory specimens during the acute phase of infection. The lowest concentration of SARS-CoV-2 viral copies this assay can detect is 138 copies/mL. A negative result does not preclude SARS-Cov-2 infection and should not be used as the sole basis for treatment or other patient management decisions. A negative result may occur with  improper specimen collection/handling, submission of specimen other than nasopharyngeal swab, presence of viral mutation(s) within the areas targeted by this assay, and inadequate number of viral copies(<138 copies/mL). A negative result must be combined with clinical observations, patient history, and epidemiological information. The expected result is Negative.  Fact Sheet for Patients:  BloggerCourse.com  Fact Sheet for Healthcare Providers:  SeriousBroker.it  This test is no t yet approved or cleared by the [place]  FDA and  has been authorized for detection and/or diagnosis of SARS-CoV-2 by FDA under an Emergency Use Authorization (EUA). This EUA will remain  in effect (meaning this test can be used) for the duration of the COVID-19 declaration under Section 564(b)(1) of the Act, 21 U.S.C.section 360bbb-3(b)(1), unless the authorization is terminated  or revoked sooner.       Influenza A by PCR NEGATIVE NEGATIVE Final   Influenza B by PCR NEGATIVE NEGATIVE Final     Comment: (NOTE) The Xpert Xpress SARS-CoV-2/FLU/RSV plus assay is intended as an aid in the diagnosis of influenza from Nasopharyngeal swab specimens and should not be used as a sole basis  for treatment. Nasal washings and aspirates are unacceptable for Xpert Xpress SARS-CoV-2/FLU/RSV testing.  Fact Sheet for Patients: BloggerCourse.com  Fact Sheet for Healthcare Providers: SeriousBroker.it  This test is not yet approved or cleared by the [place]  FDA and has been authorized for detection and/or diagnosis of SARS-CoV-2 by FDA under an Emergency Use Authorization (EUA). This EUA will remain in effect (meaning this test can be used) for the duration of the COVID-19 declaration under Section 564(b)(1) of the Act, 21 U.S.C. section 360bbb-3(b)(1), unless the authorization is terminated or revoked.     Resp Syncytial Virus by PCR NEGATIVE NEGATIVE Final    Comment: (NOTE) Fact Sheet for Patients: BloggerCourse.com  Fact Sheet for Healthcare Providers: SeriousBroker.it  This test is not yet approved or cleared by the [place]  FDA and has been authorized for detection and/or diagnosis of SARS-CoV-2 by FDA under an Emergency Use Authorization (EUA). This EUA will remain in effect (meaning this test can be used) for the duration of the COVID-19 declaration under Section 564(b)(1) of the Act, 21 U.S.C. section 360bbb-3(b)(1), unless the authorization is terminated or revoked.  Performed at [facility], [address], [place], [place] [address]   Respiratory (~20 pathogens) panel by PCR     Status: None   Collection Time: 04/06/24  6:07 PM   Specimen: Nasopharyngeal Swab; Respiratory  Result Value Ref Range Status   Adenovirus NOT DETECTED NOT DETECTED Final   Coronavirus 229E NOT DETECTED NOT DETECTED Final    Comment: (NOTE) The Coronavirus on the  Respiratory Panel, DOES NOT test for the novel  Coronavirus (2019 nCoV)    Coronavirus HKU1 NOT DETECTED NOT DETECTED Final   Coronavirus NL63 NOT DETECTED NOT DETECTED Final   Coronavirus OC43 NOT DETECTED NOT DETECTED Final   Metapneumovirus NOT DETECTED NOT DETECTED Final   Rhinovirus / Enterovirus NOT DETECTED NOT DETECTED Final   Influenza A NOT DETECTED NOT DETECTED Final   Influenza B NOT DETECTED NOT DETECTED Final   Parainfluenza Virus 1 NOT DETECTED NOT DETECTED Final   Parainfluenza Virus 2 NOT DETECTED NOT DETECTED Final   Parainfluenza Virus 3 NOT DETECTED NOT DETECTED Final   Parainfluenza Virus 4 NOT DETECTED NOT DETECTED Final   Respiratory Syncytial Virus NOT DETECTED NOT DETECTED Final   Bordetella pertussis NOT DETECTED NOT DETECTED Final   Bordetella Parapertussis NOT DETECTED NOT DETECTED Final   Chlamydophila pneumoniae NOT DETECTED NOT DETECTED Final   Mycoplasma pneumoniae NOT DETECTED NOT DETECTED Final    Comment: Performed at [facility], [address] [place], [place] [address]    Antimicrobials/Microbiology: Anti-infectives (From admission, onward)    Start     Dose/Rate Route Frequency Ordered Stop   04/07/24 2300  vancomycin (VANCOREADY) IVPB 1250 mg/250 mL        1,250 mg 166.7 mL/hr over 90 Minutes Intravenous Every 24 hours 04/06/24 2217     04/07/24 1800  ceFEPIme (MAXIPIME) 2 g in sodium chloride 0.9 % 100 mL IVPB        2 g 200 mL/hr over 30 Minutes Intravenous Every 12 hours 04/06/24 2205     04/06/24 2230  vancomycin (VANCOREADY) IVPB 1250 mg/250 mL        1,250 mg 166.7 mL/hr over 90 Minutes Intravenous  Once 04/06/24 2211 04/07/24 0044   04/06/24 1745  levofloxacin (LEVAQUIN) IVPB 750 mg        750 mg 100 mL/hr over 90 Minutes Intravenous Every 24 hours 04/06/24 1740  Component Value Date/Time   SDES  04/06/2024 1807    BLOOD BLOOD LEFT WRIST Performed at [facility], [address]  [place], [place] [address]    SPECREQUEST  04/06/2024 1807    BOTTLES DRAWN AEROBIC AND ANAEROBIC Blood Culture adequate volume Performed at [facility], [address] [place], [place] [address]    CULT  04/06/2024 1807    NO GROWTH < 12 HOURS Performed at [facility], [address], [place], [place] [address]    REPTSTATUS PENDING 04/06/2024 1807    Radiology Studies: [facility] Chest Port 1 View Result Date: 04/06/2024 CLINICAL DATA:  sob EXAM: PORTABLE CHEST 1 VIEW COMPARISON:  CT chest 04/04/2024, chest x-ray 03/14/2024 FINDINGS: The heart and mediastinal contours are unchanged. Atherosclerotic plaque. Interval worsening of right mid to lower lung zone airspace opacity. Chronic coarsened interstitial markings with no overt pulmonary edema. Trace right pleural effusion. No pneumothorax. No acute osseous abnormality. IMPRESSION: 1. Interval worsening of right mid to lower lung zone airspace opacities suggestive of pneumonia. Followup PA and lateral chest X-ray is recommended in 3-4 weeks following therapy to ensure resolution and exclude underlying malignancy. 2. Trace right pleural effusion. 3. Aortic Atherosclerosis (ICD10-I70.0) and Emphysema (ICD10-J43.9). Electronically Signed   By: Morgane  Naveau M.D.   On: 04/06/2024 19:22    LOS: 1 day   Total time spent in review of labs and imaging, patient evaluation, formulation of plan, documentation and communication with patient/family: 50 minutes  Lesa Rape, MD [facility] 04/07/2024, 9:54 AM

## 2024-04-07 NOTE — Consult Note (Signed)
 NAME:  Lindsey Williamson, MRN:  [id number], DOB:  [birth year], LOS: 1 ADMISSION DATE:  04/06/2024, CONSULTATION DATE:  04/07/2024 REFERRING MD:  Lesa Rape, MD, CHIEF COMPLAINT:  shortness of breath  History of Present Illness:  Lindsey Williamson is a 73 year old woman with history of lung cancer stage IIIa status post chemoradiation and immunotherapy.  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  She was hospitalized in February for similar symptoms and that was when she was started on oxygen and started on steroids.  Immunotherapy mediated pneumonitis.  She did feel better when she was on steroids.  She is now coming in with about 10 days of worsening shortness of breath on minimal exertion, cough which is nonproductive.  Denies fevers chills night sweats.  Her appetite is okay.  Denies any sick contacts.  She lives at home with her husband.  She says the grandchildren have been staying away because they have been sick.  She has been doing physical therapy at home and doing well with this but more short of breath in the last 10 days and her oxygen levels were lower on her 2 L.  Since discharge from the hospital she was given a course of steroids and antibiotics and did feel improved.  She was clinically improving but not felt to be completely improved.  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 rate (!) 31, height 5\' 4"  (1.626 m), weight 66 kg, SpO2 100%.       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  She had a repeat CT chest on Friday which shows worsening right-sided traction bronchiectasis and architectural distortion as well as some consolidative changes.  I worry that this is just progression of fibrotic lung disease.  She probably has a component of CPFE from her smoking-related lung disease and now this is made worse by radiation related scarring plus or minus immunotherapy mediated pneumonitis.  At this point steroids would be only thing we could meaningfully offer.  Could consider bronchoscopy to rule out infection but I think that would be higher risk low reward.  Reasonable to treat empirically with antibiotics as well as steroids although I have modified these to daily to help her hyperglycemia.  Pulmonary will follow  Louie Rover, MD Pulmonary and Critical Care Medicine [facility] 04/07/2024 11:43 AM Pager: see AMION  If no response to pager, please call critical care on call (see  AMION) until 7pm After 7:00 pm call Elink     Best Practice (right click and "Reselect all SmartList Selections" daily)   Per primary  Labs   CBC: Recent Labs  Lab 04/06/24 1807 04/07/24 0436  WBC 6.0 3.5*  NEUTROABS 4.2  --   HGB 11.9* 10.9*  HCT 38.6 35.0*  MCV 87.5 87.9  PLT 334 302    Basic Metabolic  Panel: Recent Labs  Lab 04/06/24 1807 04/06/24 2043 04/07/24 0436  NA 130*  --  133*  K 3.1*  --  4.5  CL 100  --  104  CO2 20*  --  20*  GLUCOSE 117*  --  235*  BUN 11  --  16  CREATININE 0.54  --  0.64  CALCIUM 8.7*  --  8.9  MG  --  1.0* 2.4  PHOS  --   --  3.8   GFR: Estimated Creatinine Clearance: 58.5 mL/min (by C-G formula based on SCr of 0.64 mg/dL). Recent Labs  Lab 04/06/24 1807 04/06/24 1817 04/07/24 0436  WBC 6.0  --  3.5*  LATICACIDVEN  --  1.1  --     Liver Function Tests: No results for input(s): "AST", "ALT", "ALKPHOS", "BILITOT", "PROT", "ALBUMIN" in the last 168 hours. No results for input(s): "LIPASE", "AMYLASE" in the last 168 hours. No results for input(s): "AMMONIA" in the last 168 hours.  ABG No results found for: "PHART", "PCO2ART", "PO2ART", "HCO3", "TCO2", "ACIDBASEDEF", "O2SAT"   Coagulation Profile: No results for input(s): "INR", "PROTIME" in the last 168 hours.  Cardiac Enzymes: No results for input(s): "CKTOTAL", "CKMB", "CKMBINDEX", "TROPONINI" in the last 168 hours.  HbA1C: Hgb A1c MFr Bld  Date/Time Value Ref Range Status  02/16/2024 05:59 AM 7.4 (H) 4.8 - 5.6 % Final    Comment:    (NOTE) Pre diabetes:          5.7%-6.4%  Diabetes:              >6.4%  Glycemic control for   <7.0% adults with diabetes   10/17/2023 11:27 PM 8.3 (H) 4.8 - 5.6 % Final    Comment:    (NOTE) Pre diabetes:          5.7%-6.4%  Diabetes:              >6.4%  Glycemic control for   <7.0% adults with diabetes     CBG: Recent Labs  Lab 04/07/24 0900  GLUCAP 208*    Review of Systems:   As above in HPI  Past Medical History:  She,  has a past medical history of Angioedema (10/17/2023), Arthritis, Diabetes mellitus without complication (HCC), and Hypertension.   Surgical History:   Past Surgical History:  Procedure Laterality Date   BRONCHIAL BIOPSY  03/30/2022   Procedure: BRONCHIAL BIOPSIES;  Surgeon: Prudy Brownie, DO;   Location: [facility] ENDOSCOPY;  Service: Pulmonary;;   BRONCHIAL BIOPSY  10/17/2023   Procedure: BRONCHIAL BIOPSIES;  Surgeon: Denson Flake, MD;  Location: [facility] ENDOSCOPY;  Service: Pulmonary;;   BRONCHIAL BRUSHINGS  03/30/2022   Procedure: BRONCHIAL BRUSHINGS;  Surgeon: Prudy Brownie, DO;  Location: [facility] ENDOSCOPY;  Service: Pulmonary;;   BRONCHIAL BRUSHINGS  10/17/2023   Procedure: BRONCHIAL BRUSHINGS;  Surgeon: Denson Flake, MD;  Location: [facility] ENDOSCOPY;  Service: Pulmonary;;   BRONCHIAL NEEDLE ASPIRATION BIOPSY  03/30/2022   Procedure: BRONCHIAL NEEDLE ASPIRATION BIOPSIES;  Surgeon: Prudy Brownie, DO;  Location: [facility] ENDOSCOPY;  Service: Pulmonary;;   BRONCHIAL  NEEDLE ASPIRATION BIOPSY  10/17/2023   Procedure: BRONCHIAL NEEDLE ASPIRATION BIOPSIES;  Surgeon: Denson Flake, MD;  Location: [facility] ENDOSCOPY;  Service: Pulmonary;;   EYE SURGERY     FIDUCIAL MARKER PLACEMENT  03/30/2022   Procedure: FIDUCIAL MARKER PLACEMENT;  Surgeon: Prudy Brownie, DO;  Location: [facility] ENDOSCOPY;  Service: Pulmonary;;   FINE NEEDLE ASPIRATION  10/17/2023   Procedure: FINE NEEDLE ASPIRATION;  Surgeon: Denson Flake, MD;  Location: [facility] ENDOSCOPY;  Service: Pulmonary;;   FOREIGN BODY REMOVAL  03/30/2022   Procedure: FOREIGN BODY REMOVAL;  Surgeon: Prudy Brownie, DO;  Location: [facility] ENDOSCOPY;  Service: Pulmonary;;   HEMOSTASIS CONTROL  10/17/2023   Procedure: HEMOSTASIS CONTROL;  Surgeon: Denson Flake, MD;  Location: [facility] ENDOSCOPY;  Service: Pulmonary;;   VIDEO BRONCHOSCOPY WITH ENDOBRONCHIAL ULTRASOUND Bilateral 03/30/2022   Procedure: VIDEO BRONCHOSCOPY WITH ENDOBRONCHIAL ULTRASOUND;  Surgeon: Prudy Brownie, DO;  Location: [facility] ENDOSCOPY;  Service: Pulmonary;  Laterality: Bilateral;   VIDEO BRONCHOSCOPY WITH ENDOBRONCHIAL ULTRASOUND Right 10/17/2023   Procedure: VIDEO BRONCHOSCOPY WITH ENDOBRONCHIAL ULTRASOUND;  Surgeon: Denson Flake, MD;  Location: [facility] ENDOSCOPY;  Service: Pulmonary;  Laterality: Right;   VIDEO  BRONCHOSCOPY WITH RADIAL ENDOBRONCHIAL ULTRASOUND  03/30/2022   Procedure: VIDEO BRONCHOSCOPY WITH RADIAL ENDOBRONCHIAL ULTRASOUND;  Surgeon: Prudy Brownie, DO;  Location: [facility] ENDOSCOPY;  Service: Pulmonary;;     Social History:   reports that she quit smoking about 15 months ago. Her smoking use included cigarettes. She has been exposed to tobacco smoke. She has never used smokeless tobacco. She reports that she does not drink alcohol and does not use drugs.   Family History:  Her family history includes Allergic rhinitis in her sister; Asthma in her sister. There is no history of Angioedema, Atopy, Immunodeficiency, Urticaria, or Eczema.   Allergies Allergies  Allergen Reactions   Benzonatate Palpitations   Lisinopril Swelling and Other (See Comments)    Patient ended up in the ED with a swollen mouth and tongue   Aspirin Nausea And Vomiting and Palpitations    Can tolerate baby aspirin 81 mg without difficulty but can't tolerate higher dosages   Azithromycin Palpitations   Codeine Palpitations     Home Medications  Prior to Admission medications   Medication Sig Start Date End Date Taking? Authorizing Provider  amLODipine (NORVASC) 5 MG tablet Take 2.5 mg by mouth in the morning. 02/08/22  Yes [provider]  aspirin EC 81 MG tablet Take 81 mg by mouth daily. Swallow whole.   Yes [provider]  azelastine (ASTELIN) 0.1 % nasal spray Place 2 sprays into both nostrils 2 (two) times daily as needed for rhinitis. 03/08/24  Yes Orelia Binet, MD  bisoprolol (ZEBETA) 5 MG tablet Take 2.5 mg by mouth daily.   Yes [provider]  cetirizine (ZYRTEC ALLERGY) 10 MG tablet Take 1 tablet (10 mg total) by mouth daily. 03/08/24  Yes Orelia Binet, MD  empagliflozin (JARDIANCE) 25 MG TABS tablet Take 25 mg by mouth daily at 6 PM. 02/08/22  Yes [provider]  ferrous sulfate 325 (65 FE) MG tablet Take 325 mg by mouth daily with breakfast.   Yes [provider]  fluticasone (FLONASE) 50 MCG/ACT nasal spray Place 1 spray into both nostrils daily. Patient taking differently: Place 1 spray into both nostrils daily as needed for allergies. 03/08/24  Yes Orelia Binet, MD  glipiZIDE (GLUCOTROL XL) 5 MG 24 hr tablet Take 1 tablet (5 mg total) by mouth  daily with breakfast. 02/18/24  Yes Leona Rake, MD  metFORMIN (GLUCOPHAGE-XR) 500 MG 24 hr tablet Take 1,000 mg by mouth in the morning and at bedtime. 02/08/22  Yes [provider]  pravastatin (PRAVACHOL) 80 MG tablet Take 80 mg by mouth at bedtime.   Yes [provider]  sucralfate (CARAFATE) 1 g tablet TAKE 1 TABLET(1 GRAM) BY MOUTH FOUR TIMES DAILY(WITH MEALS AND AT BEDTIME) Patient taking differently: Take 1 g by mouth 4 (four) times daily as needed. 01/10/24  Yes Heilingoetter, Cassandra L, PA-C  doxycycline (VIBRA-TABS) 100 MG tablet Take 1 tablet (100 mg total) by mouth 2 (two) times daily. Patient not taking: Reported on 04/06/2024 03/21/24   Margaretann Sharper, MD  guaiFENesin (MUCINEX) 600 MG 12 hr tablet Take 1 tablet (600 mg total) by mouth 2 (two) times daily. Patient not taking: Reported on 04/06/2024 03/07/24   Heilingoetter, Cassandra L, PA-C  guaiFENesin-dextromethorphan (ROBITUSSIN DM) 100-10 MG/5ML syrup Take 5 mLs by mouth every 4 (four) hours as needed for cough. Patient not taking: Reported on 04/06/2024 03/22/24   Margaretann Sharper, MD  nystatin (MYCOSTATIN/NYSTOP) powder Apply 1 Application topically 3 (three) times daily. Patient not taking: Reported on 04/06/2024 12/15/23   Heilingoetter, Cassandra L, PA-C  pantoprazole (PROTONIX) 40 MG tablet Take 1 tablet (40 mg total) by mouth daily at 12 noon. Patient not taking: Reported on 04/06/2024 02/18/24   Leona Rake, MD  polyethylene glycol (MIRALAX / GLYCOLAX) 17 g packet Take 17 g packet by mouth daily. Patient not taking: Reported on 04/06/2024 02/19/24   Leona Rake, MD  potassium chloride SA  (KLOR-CON M) 20 MEQ tablet Take 2 tablets (40 mEq total) by mouth daily for 3 doses. 02/19/24 02/22/24  Adhikari, Amrit, MD  predniSONE (DELTASONE) 20 MG tablet Take 1 tablet (20 mg total) by mouth daily with breakfast. Patient not taking: Reported on 04/06/2024 03/19/24   Margaretann Sharper, MD  prochlorperazine (COMPAZINE) 10 MG tablet TAKE 1 TABLET(10 MG) BY MOUTH EVERY 6 HOURS AS NEEDED Patient not taking: Reported on 04/06/2024 11/11/23   Heilingoetter, Cassandra L, PA-C     Critical care time:

## 2024-04-08 DIAGNOSIS — R918 Other nonspecific abnormal finding of lung field: Secondary | ICD-10-CM | POA: Diagnosis not present

## 2024-04-08 DIAGNOSIS — J984 Other disorders of lung: Secondary | ICD-10-CM | POA: Diagnosis not present

## 2024-04-08 DIAGNOSIS — J9621 Acute and chronic respiratory failure with hypoxia: Secondary | ICD-10-CM | POA: Diagnosis not present

## 2024-04-08 LAB — CBC
HCT: 35.8 % — ABNORMAL LOW (ref 36.0–46.0)
Hemoglobin: 10.6 g/dL — ABNORMAL LOW (ref 12.0–15.0)
MCH: 26.8 pg (ref 26.0–34.0)
MCHC: 29.6 g/dL — ABNORMAL LOW (ref 30.0–36.0)
MCV: 90.4 fL (ref 80.0–100.0)
Platelets: 323 10*3/uL (ref 150–400)
RBC: 3.96 MIL/uL (ref 3.87–5.11)
RDW: 16.2 % — ABNORMAL HIGH (ref 11.5–15.5)
WBC: 7.6 10*3/uL (ref 4.0–10.5)
nRBC: 0 % (ref 0.0–0.2)

## 2024-04-08 LAB — GLUCOSE, CAPILLARY
Glucose-Capillary: 170 mg/dL — ABNORMAL HIGH (ref 70–99)
Glucose-Capillary: 238 mg/dL — ABNORMAL HIGH (ref 70–99)
Glucose-Capillary: 241 mg/dL — ABNORMAL HIGH (ref 70–99)
Glucose-Capillary: 327 mg/dL — ABNORMAL HIGH (ref 70–99)
Glucose-Capillary: 400 mg/dL — ABNORMAL HIGH (ref 70–99)

## 2024-04-08 LAB — BASIC METABOLIC PANEL WITH GFR
Anion gap: 7 (ref 5–15)
BUN: 18 mg/dL (ref 8–23)
CO2: 22 mmol/L (ref 22–32)
Calcium: 8.8 mg/dL — ABNORMAL LOW (ref 8.9–10.3)
Chloride: 104 mmol/L (ref 98–111)
Creatinine, Ser: 0.54 mg/dL (ref 0.44–1.00)
GFR, Estimated: >60 mL/min (ref >60)
Glucose, Bld: 142 mg/dL — ABNORMAL HIGH (ref 70–99)
Potassium: 3.8 mmol/L (ref 3.5–5.1)
Sodium: 133 mmol/L — ABNORMAL LOW (ref 135–145)

## 2024-04-08 LAB — EXPECTORATED SPUTUM ASSESSMENT W GRAM STAIN, RFLX TO RESP C

## 2024-04-08 LAB — PROCALCITONIN: Procalcitonin: <0.1 ng/mL

## 2024-04-08 MED ORDER — SENNOSIDES-DOCUSATE SODIUM 8.6-50 MG PO TABS: 1.0000 | ORAL_TABLET | Freq: Every evening | ORAL | Status: DC | PRN

## 2024-04-08 MED ORDER — GUAIFENESIN 100 MG/5ML PO LIQD: 5.0000 mL | ORAL | Status: DC | PRN

## 2024-04-08 MED ORDER — ACETAMINOPHEN 325 MG PO TABS
650.0000 mg | ORAL_TABLET | Freq: Four times a day (QID) | ORAL | Status: DC | PRN
  Administered 2024-04-08: 650 mg via ORAL
  Filled 2024-04-08: qty 2

## 2024-04-08 MED ORDER — ONDANSETRON HCL 4 MG/2ML IJ SOLN: 4.0000 mg | Freq: Four times a day (QID) | INTRAMUSCULAR | Status: DC | PRN

## 2024-04-08 MED ORDER — ARFORMOTEROL TARTRATE 15 MCG/2ML IN NEBU: 15.0000 ug | INHALATION_SOLUTION | Freq: Two times a day (BID) | RESPIRATORY_TRACT | Status: DC

## 2024-04-08 MED ORDER — DOXYCYCLINE HYCLATE 100 MG PO TABS
100.0000 mg | ORAL_TABLET | Freq: Two times a day (BID) | ORAL | Status: DC
  Administered 2024-04-08 – 2024-04-11 (×7): 100 mg via ORAL
  Filled 2024-04-08 (×7): qty 1

## 2024-04-08 MED ORDER — HYDRALAZINE HCL 20 MG/ML IJ SOLN: 10.0000 mg | INTRAMUSCULAR | Status: DC | PRN

## 2024-04-08 MED ORDER — METOPROLOL TARTRATE 5 MG/5ML IV SOLN: 5.0000 mg | INTRAVENOUS | Status: DC | PRN

## 2024-04-08 MED ORDER — INSULIN GLARGINE-YFGN 100 UNIT/ML ~~LOC~~ SOLN
8.0000 [IU] | Freq: Every day | SUBCUTANEOUS | Status: DC
  Administered 2024-04-08 – 2024-04-11 (×4): 8 [IU] via SUBCUTANEOUS
  Filled 2024-04-08 (×5): qty 0.08

## 2024-04-08 NOTE — Progress Notes (Signed)
 Report given to nurse on [address]. Preparing patient for transport.

## 2024-04-08 NOTE — Consult Note (Signed)
 NAME:  Lindsey Williamson, MRN:  [id number], DOB:  [birth year], LOS: 2 ADMISSION DATE:  04/06/2024, CONSULTATION DATE:  04/08/2024 REFERRING MD:  Maggie Schooner, MD, CHIEF COMPLAINT:  shortness of breath  History of Present Illness:  Lindsey Williamson is a 73 year old woman with history of lung cancer stage IIIa status post chemoradiation and immunotherapy.  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  She was hospitalized in February for similar symptoms and that was when she was started on oxygen and started on steroids.  Immunotherapy mediated pneumonitis.  She did feel better when she was on steroids.  She is now coming in with about 10 days of worsening shortness of breath on minimal exertion, cough which is nonproductive.  Denies fevers chills night sweats.  Her appetite is okay.  Denies any sick contacts.  She lives at home with her husband.  She says the grandchildren have been staying away because they have been sick.  She has been doing physical therapy at home and doing well with this but more short of breath in the last 10 days and her oxygen levels were lower on her 2 L.  Since discharge from the hospital she was given a course of steroids and antibiotics and did feel improved.  She was clinically improving but not felt to be completely improved.  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   Objective   Blood pressure 136/68, pulse 80, temperature (!) 96.4 F (35.8 C), temperature source Axillary, resp. rate 15, height 5\' 4"  (1.626 m), weight 64.4 kg, SpO2 99%.         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 PCT low. Viral studies negative. Favor treating this as radiation related pneumonitis and she seems responsive to steroids. Continue daily solumedrol today and plan to transition to prednisone taper at discharge.I have turned [facility]. She wears 2LNC at home. Will try to get her a timely follow up appt closer to discharge   Pulmonary will follow.  Louie Rover, MD Pulmonary and Critical Care Medicine [facility] 04/08/2024 11:20 AM Pager: see AMION  If no response to pager, please call critical care on call (see AMION) until 7pm After 7:00 pm call Elink     Best Practice (right click and "Reselect all SmartList Selections" daily)   Per primary  Labs   CBC: Recent Labs  Lab 04/06/24 1807 04/07/24 0436 04/08/24 0244  WBC 6.0 3.5* 7.6  NEUTROABS 4.2  --   --   HGB 11.9* 10.9* 10.6*  HCT 38.6  35.0* 35.8*  MCV 87.5 87.9 90.4  PLT 334 302 323    Basic Metabolic Panel: Recent Labs  Lab 04/06/24 1807 04/06/24 2043 04/07/24 0436 04/08/24 0244  NA 130*  --  133* 133*  K 3.1*  --  4.5 3.8  CL 100  --  104 104  CO2 20*  --  20* 22  GLUCOSE 117*  --  235* 142*  BUN 11  --  16 18  CREATININE 0.54  --  0.64 0.54  CALCIUM 8.7*  --  8.9 8.8*  MG  --  1.0* 2.4  --   PHOS  --   --  3.8  --    GFR: Estimated Creatinine Clearance: 54.1 mL/min (by C-G formula based on SCr of  0.54 mg/dL). Recent Labs  Lab 04/06/24 1807 04/06/24 1817 04/07/24 0436 04/08/24 0244  PROCALCITON  --   --  <0.10 <0.10  WBC 6.0  --  3.5* 7.6  LATICACIDVEN  --  1.1  --   --     Liver Function Tests: No results for input(s): "AST", "ALT", "ALKPHOS", "BILITOT", "PROT", "ALBUMIN" in the last 168 hours. No results for input(s): "LIPASE", "AMYLASE" in the last 168 hours. No results for input(s): "AMMONIA" in the last 168 hours.  ABG No results found for: "PHART", "PCO2ART", "PO2ART", "HCO3", "TCO2", "ACIDBASEDEF", "O2SAT"   Coagulation Profile: No results for input(s): "INR", "PROTIME" in the last 168 hours.  Cardiac Enzymes: No results for input(s): "CKTOTAL", "CKMB", "CKMBINDEX", "TROPONINI" in the last 168 hours.  HbA1C: Hgb A1c MFr Bld  Date/Time Value Ref Range Status  02/16/2024 05:59 AM 7.4 (H) 4.8 - 5.6 % Final    Comment:    (NOTE) Pre diabetes:          5.7%-6.4%  Diabetes:              >6.4%  Glycemic control for   <7.0% adults with diabetes   10/17/2023 11:27 PM 8.3 (H) 4.8 - 5.6 % Final    Comment:    (NOTE) Pre diabetes:          5.7%-6.4%  Diabetes:              >6.4%  Glycemic control for   <7.0% adults with diabetes     CBG: Recent Labs  Lab 04/07/24 1227 04/07/24 1529 04/07/24 1737 04/07/24 2052 04/08/24 0819  GLUCAP 323* 218* 180* 254* 170*

## 2024-04-08 NOTE — Progress Notes (Signed)
 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  Upon admission found to have concerns of radiation pneumonitis versus underlying infection.  Started on antibiotics, steroids and bronchodilators.  PCCM consulted as well.   Assessment & Plan:  Principal Problem:   Pneumonitis Active Problems:   Acute on chronic hypoxic respiratory failure (HCC)   ILD (interstitial lung disease) (HCC)   COPD (chronic obstructive pulmonary disease) (HCC)   NSCLC of right lung (HCC)    Acute on chronic hypoxic respiratory failure- on 2l [place] at home Worsening right-sided airspace disease NSCLC s/p chemoradiation, immunotherapy  Recent hx immune related pneumonitis v radiation pneumonitis: Respiratory panel, COVID/RSV/flu was negative.   Aspergillus and Fungitell-pending Procalcitonin negative - On vancomycin and Rocephin, will change to 5 days of doxycycline - Steroids, bronchodilators    Elevated troponin-from demand ischemia due to hypoxia CAD s/p CABG Hyperlipidemia: Suspect demand ischemia.  Admitting troponin 117 which has trended down.  EKG is nonischemic.  Echocardiogram February 2024 shows EF 65 to 70%.  Prior provider discussed case with cardiology, continue to monitor. - Continue aspirin, statin, Zebeta   Hyponatremia Hypokalemia Hypomagnesemia: Replaced and resolved.   Hypertension: BP stable on home amlodipine bisoprolol IV as needed   Type 2 diabetes mellitus w/ uncontrolled hyperglycemia: Holding  metformin Jardiance glipizide continue SSI for now while on steroid. Add Semglee.  Adjust as necessary  PT/OT  DVT prophylaxis: enoxaparin (LOVENOX) injection 40 mg Start: 04/07/24 1000      Code Status: Do not attempt resuscitation (DNR) PRE-ARREST INTERVENTIONS DESIRED Family Communication:   Status is: Inpatient Remains inpatient appropriate because: Continue hospital stay.  Will transfer to MedSurg    Subjective:  Still some dyspnea exertion but much improved.  Examination:  General exam: Appears calm and comfortable  Respiratory system: Diminished breath sounds bilateral Cardiovascular system: S1 & S2 heard, RRR. No JVD, murmurs, rubs, gallops or clicks. No pedal edema. Gastrointestinal system: Abdomen is nondistended, soft and nontender. No organomegaly or masses felt. Normal bowel sounds heard. Central nervous system: Alert and oriented. No focal neurological deficits. Extremities: Symmetric 5 x 5 power. Skin: No rashes, lesions or ulcers Psychiatry: Judgement and insight appear normal. Mood & affect appropriate.                Diet Orders (From admission, onward)     Start     Ordered   04/06/24 2201  Diet 2 gram sodium Room service appropriate? Yes; Fluid consistency: Thin  Diet effective now       Question Answer Comment  Room service appropriate? Yes   Fluid consistency: Thin      04/06/24 2205            Objective: Vitals:   04/08/24 0956 04/08/24 1000 04/08/24 1059 04/08/24 1100  BP: (!) 146/76 (!) 145/71  136/68  Pulse:  94 80 80  Resp:  (!) 31 20 15   Temp:      TempSrc:      SpO2:  98% 98% 99%  Weight:      Height:  Intake/Output Summary (Last 24 hours) at 04/08/2024 1134 Last data filed at 04/08/2024 0800 Gross per 24 hour  Intake 350 ml  Output --  Net 350 ml   Filed Weights   04/07/24 0014 04/07/24 1527  Weight: 66 kg 64.4 kg    Scheduled Meds:  amLODipine  2.5 mg Oral Daily   aspirin EC  81 mg Oral Daily    bisoprolol  2.5 mg Oral Daily   Chlorhexidine Gluconate Cloth  6 each Topical Daily   doxycycline  100 mg Oral Q12H   enoxaparin (LOVENOX) injection  40 mg Subcutaneous Q24H   ferrous sulfate  325 mg Oral Q breakfast   guaiFENesin  600 mg Oral BID   insulin aspart  0-5 Units Subcutaneous QHS   insulin aspart  0-9 Units Subcutaneous TID WC   insulin glargine-yfgn  8 Units Subcutaneous Daily   ipratropium-albuterol  3 mL Nebulization TID   loratadine  10 mg Oral Daily   methylPREDNISolone (SOLU-MEDROL) injection  60 mg Intravenous Daily   pravastatin  80 mg Oral QHS   sodium chloride flush  3 mL Intravenous Q12H   Continuous Infusions:  Nutritional status     Body mass index is 24.37 kg/m.  Data Reviewed:   CBC: Recent Labs  Lab 04/06/24 1807 04/07/24 0436 04/08/24 0244  WBC 6.0 3.5* 7.6  NEUTROABS 4.2  --   --   HGB 11.9* 10.9* 10.6*  HCT 38.6 35.0* 35.8*  MCV 87.5 87.9 90.4  PLT 334 302 323   Basic Metabolic Panel: Recent Labs  Lab 04/06/24 1807 04/06/24 2043 04/07/24 0436 04/08/24 0244  NA 130*  --  133* 133*  K 3.1*  --  4.5 3.8  CL 100  --  104 104  CO2 20*  --  20* 22  GLUCOSE 117*  --  235* 142*  BUN 11  --  16 18  CREATININE 0.54  --  0.64 0.54  CALCIUM 8.7*  --  8.9 8.8*  MG  --  1.0* 2.4  --   PHOS  --   --  3.8  --    GFR: Estimated Creatinine Clearance: 54.1 mL/min (by C-G formula based on SCr of 0.54 mg/dL). Liver Function Tests: No results for input(s): "AST", "ALT", "ALKPHOS", "BILITOT", "PROT", "ALBUMIN" in the last 168 hours. No results for input(s): "LIPASE", "AMYLASE" in the last 168 hours. No results for input(s): "AMMONIA" in the last 168 hours. Coagulation Profile: No results for input(s): "INR", "PROTIME" in the last 168 hours. Cardiac Enzymes: No results for input(s): "CKTOTAL", "CKMB", "CKMBINDEX", "TROPONINI" in the last 168 hours. BNP (last 3 results) No results for input(s): "PROBNP" in the last 8760 hours. HbA1C: No  results for input(s): "HGBA1C" in the last 72 hours. CBG: Recent Labs  Lab 04/07/24 1529 04/07/24 1737 04/07/24 2052 04/08/24 0819 04/08/24 1123  GLUCAP 218* 180* 254* 170* 241*   Lipid Profile: Recent Labs    04/07/24 0436  CHOL 117  HDL 46  LDLCALC 60  TRIG 54  CHOLHDL 2.5   Thyroid Function Tests: No results for input(s): "TSH", "T4TOTAL", "FREET4", "T3FREE", "THYROIDAB" in the last 72 hours. Anemia Panel: No results for input(s): "VITAMINB12", "FOLATE", "FERRITIN", "TIBC", "IRON", "RETICCTPCT" in the last 72 hours. Sepsis Labs: Recent Labs  Lab 04/06/24 1817 04/07/24 0436 04/08/24 0244  PROCALCITON  --  <0.10 <0.10  LATICACIDVEN 1.1  --   --     Recent Results (from the past 240 hours)  Culture, blood (routine x  2)     Status: None (Preliminary result)   Collection Time: 04/06/24  6:05 PM   Specimen: BLOOD  Result Value Ref Range Status   Specimen Description   Final    BLOOD RIGHT ANTECUBITAL Performed at [facility], [address], [place], [place] [address]    Special Requests   Final    BOTTLES DRAWN AEROBIC AND ANAEROBIC Blood Culture adequate volume Performed at [facility], [address], [place], [place] [address]    Culture   Final    NO GROWTH 2 DAYS Performed at [facility], [address], [place], [place] [address]    Report Status PENDING  Incomplete  Culture, blood (routine x 2)     Status: None (Preliminary result)   Collection Time: 04/06/24  6:07 PM   Specimen: BLOOD  Result Value Ref Range Status   Specimen Description   Final    BLOOD BLOOD LEFT WRIST Performed at [facility], [address] [place], [place] [address]    Special Requests   Final    BOTTLES DRAWN AEROBIC AND ANAEROBIC Blood Culture adequate volume Performed at [facility], [address], [place], [place] [address]    Culture   Final    NO GROWTH 2 DAYS Performed at [facility], [address], [place], [place] [address]    Report Status PENDING  Incomplete  Resp panel by RT-PCR (RSV, Flu A&B, Covid)     Status: None   Collection Time: 04/06/24  6:07 PM   Specimen: Nasal Swab  Result Value Ref Range Status   SARS Coronavirus 2 by RT PCR NEGATIVE NEGATIVE Final    Comment: (NOTE) SARS-CoV-2 target nucleic acids are NOT DETECTED.  The SARS-CoV-2 RNA is generally detectable in upper respiratory specimens during the acute phase of infection. The lowest concentration of SARS-CoV-2 viral copies this assay can detect is 138 copies/mL. A negative result does not preclude SARS-Cov-2 infection and should not be used as the sole basis for treatment or other patient management decisions. A negative result may occur with  improper specimen collection/handling, submission of specimen other than nasopharyngeal swab, presence of viral mutation(s) within the areas targeted by this assay, and inadequate number of viral copies(<138 copies/mL). A negative result must be combined with clinical observations, patient history, and epidemiological information. The expected result is Negative.  Fact Sheet for Patients:  BloggerCourse.com  Fact Sheet for Healthcare Providers:  SeriousBroker.it  This test is no t yet approved or cleared by the [place]  FDA and  has been authorized for detection and/or diagnosis of SARS-CoV-2 by FDA under an Emergency Use Authorization (EUA). This EUA will remain  in effect (meaning this test can be used) for the duration of the COVID-19 declaration under Section 564(b)(1) of the Act, 21 U.S.C.section 360bbb-3(b)(1), unless the authorization is terminated  or revoked sooner.       Influenza A by PCR NEGATIVE NEGATIVE Final   Influenza B by PCR NEGATIVE NEGATIVE Final    Comment: (NOTE) The Xpert Xpress SARS-CoV-2/FLU/RSV plus assay is intended as an aid in the diagnosis of  influenza from Nasopharyngeal swab specimens and should not be used as a sole basis for treatment. Nasal washings and aspirates are unacceptable for Xpert Xpress SARS-CoV-2/FLU/RSV testing.  Fact Sheet for Patients: BloggerCourse.com  Fact Sheet for Healthcare Providers: SeriousBroker.it  This test is not yet approved or cleared by the [place]  FDA and  has been authorized for detection and/or diagnosis of SARS-CoV-2 by FDA under an Emergency Use Authorization (EUA). This EUA will remain in effect (meaning this test can be used) for the duration of the COVID-19 declaration under Section 564(b)(1) of the Act, 21 U.S.C. section 360bbb-3(b)(1), unless the authorization is terminated or revoked.     Resp Syncytial Virus by PCR NEGATIVE NEGATIVE Final    Comment: (NOTE) Fact Sheet for Patients: BloggerCourse.com  Fact Sheet for Healthcare Providers: SeriousBroker.it  This test is not yet approved or cleared by the [place]  FDA and has been authorized for detection and/or diagnosis of SARS-CoV-2 by FDA under an Emergency Use Authorization (EUA). This EUA will remain in effect (meaning this test can be used) for the duration of the COVID-19 declaration under Section 564(b)(1) of the Act, 21 U.S.C. section 360bbb-3(b)(1), unless the authorization is terminated or revoked.  Performed at [facility], [address] [place], [place] [address]   Respiratory (~20 pathogens) panel by PCR     Status: None   Collection Time: 04/06/24  6:07 PM   Specimen: Nasopharyngeal Swab; Respiratory  Result Value Ref Range Status   Adenovirus NOT DETECTED NOT DETECTED Final   Coronavirus 229E NOT DETECTED NOT DETECTED Final    Comment: (NOTE) The Coronavirus on the Respiratory Panel, DOES NOT test for the novel  Coronavirus (2019 nCoV)    Coronavirus HKU1 NOT DETECTED  NOT DETECTED Final   Coronavirus NL63 NOT DETECTED NOT DETECTED Final   Coronavirus OC43 NOT DETECTED NOT DETECTED Final   Metapneumovirus NOT DETECTED NOT DETECTED Final   Rhinovirus / Enterovirus NOT DETECTED NOT DETECTED Final   Influenza A NOT DETECTED NOT DETECTED Final   Influenza B NOT DETECTED NOT DETECTED Final   Parainfluenza Virus 1 NOT DETECTED NOT DETECTED Final   Parainfluenza Virus 2 NOT DETECTED NOT DETECTED Final   Parainfluenza Virus 3 NOT DETECTED NOT DETECTED Final   Parainfluenza Virus 4 NOT DETECTED NOT DETECTED Final   Respiratory Syncytial Virus NOT DETECTED NOT DETECTED Final   Bordetella pertussis NOT DETECTED NOT DETECTED Final   Bordetella Parapertussis NOT DETECTED NOT DETECTED Final   Chlamydophila pneumoniae NOT DETECTED NOT DETECTED Final   Mycoplasma pneumoniae NOT DETECTED NOT DETECTED Final    Comment: Performed at [facility], [address], [place], [place] [address]  MRSA Next Gen by PCR, Nasal     Status: None   Collection Time: 04/07/24  3:32 PM   Specimen: Nasal Mucosa; Nasal Swab  Result Value Ref Range Status   MRSA by PCR Next Gen NOT DETECTED NOT DETECTED Final    Comment: (NOTE) The GeneXpert MRSA Assay (FDA approved for NASAL specimens only), is one component of a comprehensive MRSA colonization surveillance program. It is not intended to diagnose MRSA infection nor to guide or monitor treatment for MRSA infections. Test performance is not FDA approved in patients less than 57 years old. Performed at [facility], [address], [place], [place] [address]   Expectorated Sputum Assessment w Gram Stain, Rflx to Resp Cult     Status: None   Collection Time: 04/08/24  6:58 AM   Specimen: Expectorated Sputum  Result Value Ref Range Status   Specimen Description EXPECTORATED SPUTUM  Final   Special Requests NONE  Final   Sputum evaluation   Final    Sputum specimen not acceptable for testing.  Please recollect.    Claudene Crystal RN @ [phone] 04/08/24 Performed at  [facility], [address], [place], [place] [address]    Report Status 04/08/2024 FINAL  Final         Radiology Studies: DG Chest Port 1 View Result Date: 04/06/2024 CLINICAL DATA:  sob EXAM: PORTABLE CHEST 1 VIEW COMPARISON:  CT chest 04/04/2024, chest x-ray 03/14/2024 FINDINGS: The heart and mediastinal contours are unchanged. Atherosclerotic plaque. Interval worsening of right mid to lower lung zone airspace opacity. Chronic coarsened interstitial markings with no overt pulmonary edema. Trace right pleural effusion. No pneumothorax. No acute osseous abnormality. IMPRESSION: 1. Interval worsening of right mid to lower lung zone airspace opacities suggestive of pneumonia. Followup PA and lateral chest X-ray is recommended in 3-4 weeks following therapy to ensure resolution and exclude underlying malignancy. 2. Trace right pleural effusion. 3. Aortic Atherosclerosis (ICD10-I70.0) and Emphysema (ICD10-J43.9). Electronically Signed   By: Morgane  Naveau M.D.   On: 04/06/2024 19:22           LOS: 2 days   Time spent= 35 mins    Maggie Schooner, MD [facility]  If 7PM-7AM, please contact night-coverage  04/08/2024, 11:34 AM

## 2024-04-09 DIAGNOSIS — J9621 Acute and chronic respiratory failure with hypoxia: Secondary | ICD-10-CM | POA: Diagnosis not present

## 2024-04-09 DIAGNOSIS — J984 Other disorders of lung: Secondary | ICD-10-CM | POA: Diagnosis not present

## 2024-04-09 LAB — CBC
HCT: 35.1 % — ABNORMAL LOW (ref 36.0–46.0)
Hemoglobin: 10.5 g/dL — ABNORMAL LOW (ref 12.0–15.0)
MCH: 26.8 pg (ref 26.0–34.0)
MCHC: 29.9 g/dL — ABNORMAL LOW (ref 30.0–36.0)
MCV: 89.5 fL (ref 80.0–100.0)
Platelets: 331 10*3/uL (ref 150–400)
RBC: 3.92 MIL/uL (ref 3.87–5.11)
RDW: 16.2 % — ABNORMAL HIGH (ref 11.5–15.5)
WBC: 6.5 10*3/uL (ref 4.0–10.5)
nRBC: 0 % (ref 0.0–0.2)

## 2024-04-09 LAB — GLUCOSE, CAPILLARY
Glucose-Capillary: 165 mg/dL — ABNORMAL HIGH (ref 70–99)
Glucose-Capillary: 230 mg/dL — ABNORMAL HIGH (ref 70–99)
Glucose-Capillary: 302 mg/dL — ABNORMAL HIGH (ref 70–99)
Glucose-Capillary: 214 mg/dL — ABNORMAL HIGH (ref 70–99)

## 2024-04-09 LAB — BASIC METABOLIC PANEL WITH GFR
Anion gap: 7 (ref 5–15)
BUN: 19 mg/dL (ref 8–23)
CO2: 23 mmol/L (ref 22–32)
Calcium: 9.2 mg/dL (ref 8.9–10.3)
Chloride: 106 mmol/L (ref 98–111)
Creatinine, Ser: 0.32 mg/dL — ABNORMAL LOW (ref 0.44–1.00)
GFR, Estimated: >60 mL/min (ref >60)
Glucose, Bld: 157 mg/dL — ABNORMAL HIGH (ref 70–99)
Potassium: 3.7 mmol/L (ref 3.5–5.1)
Sodium: 136 mmol/L (ref 135–145)

## 2024-04-09 LAB — PHOSPHORUS: Phosphorus: 3 mg/dL (ref 2.5–4.6)

## 2024-04-09 LAB — ASPERGILLUS ANTIGEN, BAL/SERUM: Aspergillus Ag, BAL/Serum: 0.08 {index} (ref 0.00–0.49)

## 2024-04-09 LAB — MAGNESIUM: Magnesium: 1.6 mg/dL — ABNORMAL LOW (ref 1.7–2.4)

## 2024-04-09 MED ORDER — PREDNISONE 20 MG PO TABS
30.0000 mg | ORAL_TABLET | Freq: Every day | ORAL | Status: DC
  Administered 2024-04-10: 30 mg via ORAL
  Filled 2024-04-09: qty 1

## 2024-04-09 MED ORDER — POTASSIUM CHLORIDE CRYS ER 20 MEQ PO TBCR
40.0000 meq | EXTENDED_RELEASE_TABLET | Freq: Once | ORAL | Status: AC
Start: 2024-04-09 — End: 2024-04-09
  Administered 2024-04-09: 40 meq via ORAL
  Filled 2024-04-09: qty 2

## 2024-04-09 MED ORDER — INSULIN ASPART 100 UNIT/ML IJ SOLN
3.0000 [IU] | Freq: Three times a day (TID) | INTRAMUSCULAR | Status: DC
  Administered 2024-04-09 – 2024-04-11 (×5): 3 [IU] via SUBCUTANEOUS

## 2024-04-09 MED ORDER — MAGNESIUM OXIDE -MG SUPPLEMENT 400 (240 MG) MG PO TABS
800.0000 mg | ORAL_TABLET | ORAL | Status: AC
  Administered 2024-04-09 (×2): 800 mg via ORAL
  Filled 2024-04-09 (×2): qty 2

## 2024-04-09 MED ORDER — IPRATROPIUM-ALBUTEROL 0.5-2.5 (3) MG/3ML IN SOLN
3.0000 mL | Freq: Two times a day (BID) | RESPIRATORY_TRACT | Status: DC
  Administered 2024-04-09 – 2024-04-11 (×4): 3 mL via RESPIRATORY_TRACT
  Filled 2024-04-09 (×4): qty 3

## 2024-04-09 NOTE — Plan of Care (Signed)

## 2024-04-09 NOTE — Progress Notes (Signed)
 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  Upon admission found to have concerns of radiation pneumonitis versus underlying infection.  Started on antibiotics, steroids and bronchodilators.  PCCM consulted as well.   Assessment & Plan:  Principal Problem:   Pneumonitis Active Problems:   Acute on chronic hypoxic respiratory failure (HCC)   ILD (interstitial lung disease) (HCC)   COPD (chronic obstructive pulmonary disease) (HCC)   NSCLC of right lung (HCC)    Acute on chronic hypoxic respiratory failure- on 2l Sun at home Worsening right-sided airspace disease NSCLC s/p chemoradiation, immunotherapy  Recent hx immune related pneumonitis v radiation pneumonitis: Respiratory panel, COVID/RSV/flu was negative.  Still getting tachycardic and short of breath with ambulation Aspergillus and Fungitell-pending Procalcitonin negative - On vancomycin and Rocephin, will change to 5 days of doxycycline - Steroids, bronchodilators    Elevated troponin-from demand ischemia due to hypoxia CAD s/p CABG Hyperlipidemia: Suspect demand ischemia.  Admitting troponin 117 which has trended down.  EKG is nonischemic.  Echocardiogram February 2024 shows EF 65 to 70%.  Prior provider discussed case with cardiology, continue to monitor. - Continue aspirin, statin, Zebeta   Hyponatremia Hypokalemia Hypomagnesemia: Replaced and resolved.   Hypertension: BP stable on home amlodipine bisoprolol IV as needed   Type 2  diabetes mellitus w/ uncontrolled hyperglycemia: Holding metformin Jardiance glipizide continue SSI for now while on steroid. Add Semglee.  Adjust as necessary  PT/OT  DVT prophylaxis: enoxaparin (LOVENOX) injection 40 mg Start: 04/07/24 1000      Code Status: Do not attempt resuscitation (DNR) PRE-ARREST INTERVENTIONS DESIRED Family Communication:   Status is: Inpatient Remains inpatient appropriate because: Hopefully discharge in next 24-48 hours pending her clinical improvement    Subjective:  At rest she is feeling better but when with ambulating with physical therapy she was not able to walk long distance due to shortness of breath.  She also became tachycardic  Examination:  General exam: Appears calm and comfortable  Respiratory system: Diminished breath sounds bilateral Cardiovascular system: S1 & S2 heard, RRR. No JVD, murmurs, rubs, gallops or clicks. No pedal edema. Gastrointestinal system: Abdomen is nondistended, soft and nontender. No organomegaly or masses felt. Normal bowel sounds heard. Central nervous system: Alert and oriented. No focal neurological deficits. Extremities: Symmetric 5 x 5 power. Skin: No rashes, lesions or ulcers Psychiatry: Judgement and insight appear normal. Mood & affect appropriate.                Diet Orders (From admission, onward)     Start     Ordered   04/06/24 2201  Diet 2 gram sodium Room service appropriate? Yes; Fluid consistency: Thin  Diet effective now       Question Answer Comment  Room service appropriate? Yes   Fluid consistency: Thin      04/06/24 2205            Objective: Vitals:   04/09/24 0903 04/09/24 1006 04/09/24 1016 04/09/24 1100  BP:   127/74   Pulse:  (!) 125 (!) 103   Resp:  18   Temp:   (!) 97.5 F (36.4 C)   TempSrc:   Oral   SpO2: 97% 92% 98% 95%  Weight:      Height:        Intake/Output Summary (Last 24 hours) at 04/09/2024 1125 Last data filed at 04/09/2024 1025 Gross  per 24 hour  Intake 483 ml  Output --  Net 483 ml   Filed Weights   04/07/24 0014 04/07/24 1527  Weight: 66 kg 64.4 kg    Scheduled Meds:  amLODipine  2.5 mg Oral Daily   aspirin EC  81 mg Oral Daily   bisoprolol  2.5 mg Oral Daily   Chlorhexidine Gluconate Cloth  6 each Topical Daily   doxycycline  100 mg Oral Q12H   enoxaparin (LOVENOX) injection  40 mg Subcutaneous Q24H   ferrous sulfate  325 mg Oral Q breakfast   guaiFENesin  600 mg Oral BID   insulin aspart  0-5 Units Subcutaneous QHS   insulin aspart  0-9 Units Subcutaneous TID WC   insulin glargine-yfgn  8 Units Subcutaneous Daily   ipratropium-albuterol  3 mL Nebulization BID   loratadine  10 mg Oral Daily   magnesium oxide  800 mg Oral Q4H   methylPREDNISolone (SOLU-MEDROL) injection  60 mg Intravenous Daily   pravastatin  80 mg Oral QHS   sodium chloride flush  3 mL Intravenous Q12H   Continuous Infusions:  Nutritional status     Body mass index is 24.37 kg/m.  Data Reviewed:   CBC: Recent Labs  Lab 04/06/24 1807 04/07/24 0436 04/08/24 0244 04/09/24 0417  WBC 6.0 3.5* 7.6 6.5  NEUTROABS 4.2  --   --   --   HGB 11.9* 10.9* 10.6* 10.5*  HCT 38.6 35.0* 35.8* 35.1*  MCV 87.5 87.9 90.4 89.5  PLT 334 302 323 331   Basic Metabolic Panel: Recent Labs  Lab 04/06/24 1807 04/06/24 2043 04/07/24 0436 04/08/24 0244 04/09/24 0417  NA 130*  --  133* 133* 136  K 3.1*  --  4.5 3.8 3.7  CL 100  --  104 104 106  CO2 20*  --  20* 22 23  GLUCOSE 117*  --  235* 142* 157*  BUN 11  --  16 18 19   CREATININE 0.54  --  0.64 0.54 0.32*  CALCIUM 8.7*  --  8.9 8.8* 9.2  MG  --  1.0* 2.4  --  1.6*  PHOS  --   --  3.8  --  3.0   GFR: Estimated Creatinine Clearance: 54.1 mL/min (A) (by C-G formula based on SCr of 0.32 mg/dL (L)). Liver Function Tests: No results for input(s): "AST", "ALT", "ALKPHOS", "BILITOT", "PROT", "ALBUMIN" in the last 168 hours. No results for input(s): "LIPASE", "AMYLASE" in the last 168  hours. No results for input(s): "AMMONIA" in the last 168 hours. Coagulation Profile: No results for input(s): "INR", "PROTIME" in the last 168 hours. Cardiac Enzymes: No results for input(s): "CKTOTAL", "CKMB", "CKMBINDEX", "TROPONINI" in the last 168 hours. BNP (last 3 results) No results for input(s): "PROBNP" in the last 8760 hours. HbA1C: No results for input(s): "HGBA1C" in the last 72 hours. CBG: Recent Labs  Lab 04/08/24 1123 04/08/24 1721 04/08/24 2045 04/08/24 2357 04/09/24 0738  GLUCAP 241* 327* 400* 238* 165*   Lipid Profile: Recent Labs    04/07/24 0436  CHOL 117  HDL 46  LDLCALC 60  TRIG 54  CHOLHDL 2.5   Thyroid Function Tests: No results  for input(s): "TSH", "T4TOTAL", "FREET4", "T3FREE", "THYROIDAB" in the last 72 hours. Anemia Panel: No results for input(s): "VITAMINB12", "FOLATE", "FERRITIN", "TIBC", "IRON", "RETICCTPCT" in the last 72 hours. Sepsis Labs: Recent Labs  Lab 04/06/24 1817 04/07/24 0436 04/08/24 0244  PROCALCITON  --  <0.10 <0.10  LATICACIDVEN 1.1  --   --     Recent Results (from the past 240 hours)  Culture, blood (routine x 2)     Status: None (Preliminary result)   Collection Time: 04/06/24  6:05 PM   Specimen: BLOOD  Result Value Ref Range Status   Specimen Description   Final    BLOOD RIGHT ANTECUBITAL Performed at [facility], [address], [place], [place] [address]    Special Requests   Final    BOTTLES DRAWN AEROBIC AND ANAEROBIC Blood Culture adequate volume Performed at [facility], [address], [place], [place] [address]    Culture   Final    NO GROWTH 3 DAYS Performed at [facility], [address] [place], [place] [address]    Report Status PENDING  Incomplete  Culture, blood (routine x 2)     Status: None (Preliminary result)   Collection Time: 04/06/24  6:07 PM   Specimen: BLOOD  Result Value Ref Range Status   Specimen Description   Final    BLOOD  BLOOD LEFT WRIST Performed at [facility], [address] [place], [place] [address]    Special Requests   Final    BOTTLES DRAWN AEROBIC AND ANAEROBIC Blood Culture adequate volume Performed at [facility], [address], [place], [place] [address]    Culture   Final    NO GROWTH 3 DAYS Performed at [facility], [address], [place], [place] [address]    Report Status PENDING  Incomplete  Resp panel by RT-PCR (RSV, Flu A&B, Covid)     Status: None   Collection Time: 04/06/24  6:07 PM   Specimen: Nasal Swab  Result Value Ref Range Status   SARS Coronavirus 2 by RT PCR NEGATIVE NEGATIVE Final    Comment: (NOTE) SARS-CoV-2 target nucleic acids are NOT DETECTED.  The SARS-CoV-2 RNA is generally detectable in upper respiratory specimens during the acute phase of infection. The lowest concentration of SARS-CoV-2 viral copies this assay can detect is 138 copies/mL. A negative result does not preclude SARS-Cov-2 infection and should not be used as the sole basis for treatment or other patient management decisions. A negative result may occur with  improper specimen collection/handling, submission of specimen other than nasopharyngeal swab, presence of viral mutation(s) within the areas targeted by this assay, and inadequate number of viral copies(<138 copies/mL). A negative result must be combined with clinical observations, patient history, and epidemiological information. The expected result is Negative.  Fact Sheet for Patients:  BloggerCourse.com  Fact Sheet for Healthcare Providers:  SeriousBroker.it  This test is no t yet approved or cleared by the [place] FDA and  has been authorized for detection and/or diagnosis of SARS-CoV-2 by FDA under an Emergency Use Authorization (EUA). This EUA will remain  in effect (meaning this test can be used) for the duration of  the COVID-19 declaration under Section 564(b)(1) of the Act, 21 U.S.C.section 360bbb-3(b)(1), unless the authorization is terminated  or revoked sooner.       Influenza A by PCR NEGATIVE NEGATIVE Final   Influenza B by PCR NEGATIVE NEGATIVE Final  Comment: (NOTE) The Xpert Xpress SARS-CoV-2/FLU/RSV plus assay is intended as an aid in the diagnosis of influenza from Nasopharyngeal swab specimens and should not be used as a sole basis for treatment. Nasal washings and aspirates are unacceptable for Xpert Xpress SARS-CoV-2/FLU/RSV testing.  Fact Sheet for Patients: BloggerCourse.com  Fact Sheet for Healthcare Providers: SeriousBroker.it  This test is not yet approved or cleared by the [place]  FDA and has been authorized for detection and/or diagnosis of SARS-CoV-2 by FDA under an Emergency Use Authorization (EUA). This EUA will remain in effect (meaning this test can be used) for the duration of the COVID-19 declaration under Section 564(b)(1) of the Act, 21 U.S.C. section 360bbb-3(b)(1), unless the authorization is terminated or revoked.     Resp Syncytial Virus by PCR NEGATIVE NEGATIVE Final    Comment: (NOTE) Fact Sheet for Patients: BloggerCourse.com  Fact Sheet for Healthcare Providers: SeriousBroker.it  This test is not yet approved or cleared by the [place]  FDA and has been authorized for detection and/or diagnosis of SARS-CoV-2 by FDA under an Emergency Use Authorization (EUA). This EUA will remain in effect (meaning this test can be used) for the duration of the COVID-19 declaration under Section 564(b)(1) of the Act, 21 U.S.C. section 360bbb-3(b)(1), unless the authorization is terminated or revoked.  Performed at [facility], [address], [place], [place] [address]   Respiratory (~20 pathogens) panel by PCR     Status: None    Collection Time: 04/06/24  6:07 PM   Specimen: Nasopharyngeal Swab; Respiratory  Result Value Ref Range Status   Adenovirus NOT DETECTED NOT DETECTED Final   Coronavirus 229E NOT DETECTED NOT DETECTED Final    Comment: (NOTE) The Coronavirus on the Respiratory Panel, DOES NOT test for the novel  Coronavirus (2019 nCoV)    Coronavirus HKU1 NOT DETECTED NOT DETECTED Final   Coronavirus NL63 NOT DETECTED NOT DETECTED Final   Coronavirus OC43 NOT DETECTED NOT DETECTED Final   Metapneumovirus NOT DETECTED NOT DETECTED Final   Rhinovirus / Enterovirus NOT DETECTED NOT DETECTED Final   Influenza A NOT DETECTED NOT DETECTED Final   Influenza B NOT DETECTED NOT DETECTED Final   Parainfluenza Virus 1 NOT DETECTED NOT DETECTED Final   Parainfluenza Virus 2 NOT DETECTED NOT DETECTED Final   Parainfluenza Virus 3 NOT DETECTED NOT DETECTED Final   Parainfluenza Virus 4 NOT DETECTED NOT DETECTED Final   Respiratory Syncytial Virus NOT DETECTED NOT DETECTED Final   Bordetella pertussis NOT DETECTED NOT DETECTED Final   Bordetella Parapertussis NOT DETECTED NOT DETECTED Final   Chlamydophila pneumoniae NOT DETECTED NOT DETECTED Final   Mycoplasma pneumoniae NOT DETECTED NOT DETECTED Final    Comment: Performed at [facility], [address], [place], [place] [address]  MRSA Next Gen by PCR, Nasal     Status: None   Collection Time: 04/07/24  3:32 PM   Specimen: Nasal Mucosa; Nasal Swab  Result Value Ref Range Status   MRSA by PCR Next Gen NOT DETECTED NOT DETECTED Final    Comment: (NOTE) The GeneXpert MRSA Assay (FDA approved for NASAL specimens only), is one component of a comprehensive MRSA colonization surveillance program. It is not intended to diagnose MRSA infection nor to guide or monitor treatment for MRSA infections. Test performance is not FDA approved in patients less than 93 years old. Performed at [facility], [address] [place], [place] [address]    Expectorated Sputum Assessment w Gram Stain, Rflx  to Resp Cult     Status: None   Collection Time: 04/08/24  6:58 AM   Specimen: Expectorated Sputum  Result Value Ref Range Status   Specimen Description EXPECTORATED SPUTUM  Final   Special Requests NONE  Final   Sputum evaluation   Final    Sputum specimen not acceptable for testing.  Please recollect.   Claudene Crystal RN @ [phone] 04/08/24 Performed at [facility], [address], [place], [place] [address]    Report Status 04/08/2024 FINAL  Final         Radiology Studies: No results found.         LOS: 3 days   Time spent= 35 mins    Maggie Schooner, MD [facility]  If 7PM-7AM, please contact night-coverage  04/09/2024, 11:25 AM

## 2024-04-09 NOTE — Inpatient Diabetes Management (Signed)
 Inpatient Diabetes Program Recommendations  AACE/ADA: New Consensus Statement on Inpatient Glycemic Control (2015)  Target Ranges:  Prepandial:   less than 140 mg/dL      Peak postprandial:   less than 180 mg/dL (1-2 hours)      Critically ill patients:  140 - 180 mg/dL   Lab Results  Component Value Date   GLUCAP 230 (H) 04/09/2024   HGBA1C 7.4 (H) 02/16/2024    Review of Glycemic Control  Diabetes history: DM2 Outpatient Diabetes medications: Jardiance 25 mg daily, Glucotrol 5 mg daily.\, Metformin 1 gm bid Current orders for Inpatient glycemic control:Semglee 8 units daily , Novolog 0-9 units tid, 0-5 units hs, Solumedrol 60 daily then decrease to Prednisone 30 starting tomorrow 4/15   Inpatient Diabetes Program Recommendations:   Please consider while on steroids: -Novolog 3 units tid meal coverage if eats 50% meal -Change Novolog correction to 0-9 units q 4 hrs.  Thank you, Jerzy Roepke E. Veer Elamin, RN, MSN, CDCES  Diabetes Coordinator Inpatient Glycemic Control Team Team Pager [phone] (8am-5pm) 04/09/2024 1:53 PM

## 2024-04-09 NOTE — Evaluation (Signed)
 Physical Therapy Evaluation Patient Details Name: Lindsey Williamson MRN: [id number] DOB: June 29, 1951 Today's Date: 04/09/2024  History of Present Illness  73 y.o. female who presents with worsening dyspnea on exertion and hypoxemia at home. Dx of Acute on chronic hypoxic respiratory failure. 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  Clinical Impression  Pt admitted with above diagnosis. Pt ambulated 30' with RW, no loss of balance, SpO2 92% on 3L, HR 125, distance limited by fatigue. Pt requires verbal cues for pursed lip breathing technique. Instructed pt in seated BUE/LE exercises to be done independently to minimize deconditioning.  Pt currently with functional limitations due to the deficits listed below (see PT Problem List). Pt will benefit from acute skilled PT to increase their independence and safety with mobility to allow discharge.           If plan is discharge home, recommend the following: A little help with bathing/dressing/bathroom;Assistance with cooking/housework;Assist for transportation;Help with stairs or ramp for entrance   Can travel by private vehicle        Equipment Recommendations None recommended by PT  Recommendations for Other Services       Functional Status Assessment Patient has had a recent decline in their functional status and demonstrates the ability to make significant improvements in function in a reasonable and predictable amount of time.     Precautions / Restrictions Precautions Precautions: Other (comment) Recall of Precautions/Restrictions: Intact Precaution/Restrictions Comments: monitor O2 Restrictions Weight Bearing Restrictions Per Provider Order: No      Mobility  Bed Mobility Overal bed mobility: Modified Independent              General bed mobility comments: HOB up, used rail, no assist needed    Transfers Overall transfer level: Needs assistance Equipment used: Rolling walker (2 wheels) Transfers: Sit to/from Stand Sit to Stand: Supervision           General transfer comment: no loss of balance with sit to stand    Ambulation/Gait Ambulation/Gait assistance: Contact guard assist Gait Distance (Feet): 30 Feet Assistive device: Rolling walker (2 wheels) Gait Pattern/deviations: Step-through pattern, Decreased stride length Gait velocity: decr     General Gait Details: steady, no loss of balance, SpO2 97% on 3L at rest, 92% on 3L walking, HR 125 walking, VCs for pursed lip breathing; distance limited by fatigue  Stairs            Wheelchair Mobility     Tilt Bed    Modified Rankin (Stroke Patients Only)       Balance Overall balance assessment: Modified Independent                                           Pertinent Vitals/Pain Pain Assessment Pain Assessment: No/denies pain    Home Living Family/patient expects to be discharged to:: Private residence Living Arrangements: Spouse/significant other Available Help at Discharge: Family;Available 24 hours/day Type of Home: Apartment Home Access: Stairs to enter Entrance Stairs-Rails: Right Entrance Stairs-Number of Steps: 14   Home Layout: One level Home Equipment: Agricultural consultant (2 wheels)      Prior Function Prior Level of Function : Independent/Modified Independent  Mobility Comments: has a walker at home but doesn't use it because of tight space, denies falls in past 6 months, doesn't go out bc of flight of stairs to enter       Extremity/Trunk Assessment   Upper Extremity Assessment Upper Extremity Assessment: Overall WFL for tasks assessed    Lower Extremity Assessment Lower Extremity Assessment: Overall WFL for tasks assessed    Cervical / Trunk Assessment Cervical /  Trunk Assessment: Normal  Communication   Communication Communication: No apparent difficulties    Cognition Arousal: Alert Behavior During Therapy: WFL for tasks assessed/performed   PT - Cognitive impairments: No apparent impairments                         Following commands: Intact       Cueing       General Comments      Exercises Other Exercises Other Exercises: demonstrated LAQs, shoulder flexion, APs to be done independently; pt fatigued after walking so did not perform these this session   Assessment/Plan    PT Assessment Patient needs continued PT services  PT Problem List Decreased activity tolerance       PT Treatment Interventions Gait training;Therapeutic activities;Therapeutic exercise    PT Goals (Current goals can be found in the Care Plan section)  Acute Rehab PT Goals Patient Stated Goal: to be able to walk farther PT Goal Formulation: With patient Time For Goal Achievement: 04/23/24 Potential to Achieve Goals: Good    Frequency Min 3X/week     Co-evaluation               AM-PAC PT "6 Clicks" Mobility  Outcome Measure Help needed turning from your back to your side while in a flat bed without using bedrails?: None Help needed moving from lying on your back to sitting on the side of a flat bed without using bedrails?: A Little Help needed moving to and from a bed to a chair (including a wheelchair)?: A Little Help needed standing up from a chair using your arms (e.g., wheelchair or bedside chair)?: A Little Help needed to walk in hospital room?: A Little Help needed climbing 3-5 steps with a railing? : A Little 6 Click Score: 19    End of Session Equipment Utilized During Treatment: Gait belt;Oxygen Activity Tolerance: Patient tolerated treatment well Patient left: in chair;with call bell/phone within reach Nurse Communication: Mobility status PT Visit Diagnosis: Difficulty in walking, not elsewhere classified (R26.2)     Time: 1610-9604 PT Time Calculation (min) (ACUTE ONLY): 26 min   Charges:   PT Evaluation $PT Eval Moderate Complexity: 1 Mod PT Treatments $Gait Training: 8-22 mins PT General Charges $$ ACUTE PT VISIT: 1 Visit         Daymon Evans PT 04/09/2024  Acute Rehabilitation Services  Office [phone]

## 2024-04-09 NOTE — Evaluation (Signed)
 Occupational Therapy Evaluation Patient Details Name: Lindsey Williamson MRN: [id number] DOB: [birth year] Today's Date: 04/09/2024   History of Present Illness   Patient is a 73 year old female who presented on 4/12 with worsening hypoxemia and dyspnea at home. Patient was admitted with acute on chronic respiratory failure. PMH: stage IIIa non-small cell lung cancer with chemoradiation, COPD, 2L/min at baseline, HTN, diabetes.     Clinical Impressions Patient evaluated by Occupational Therapy with no further acute OT needs identified. All education has been completed and the patient has no further questions. Patient is able to complete ADLs seated at MI at this time.  See below for any follow-up Occupational Therapy or equipment needs. OT is signing off. Thank you for this referral.     If plan is discharge home, recommend the following:   Assistance with cooking/housework;Assist for transportation;Help with stairs or ramp for entrance     Functional Status Assessment   Patient has not had a recent decline in their functional status     Equipment Recommendations   BSC/3in1      Precautions/Restrictions   Precautions Precautions: Other (comment) (monitor O2) Recall of Precautions/Restrictions: Intact Restrictions Weight Bearing Restrictions Per Provider Order: No            ADL either performed or assessed with clinical judgement   ADL Overall ADL's : At baseline           General ADL Comments: patient is MI for ADLs in room with increased SOB with increased tasks. patient was educated on ECT in next level of care and provided with hand out. patient verbalized understanding. patient was also educaetd on tub transfer bench and compensatory strategeis for showering. patient verbalized understanding. patient was provided with hand out to work on diaphragmatic breathing during session with patient needing increased cues for completion. patient reported she would work  on it. no further OT needs identified. OT to sign off.     Vision   Vision Assessment?: No apparent visual deficits            Pertinent Vitals/Pain Pain Assessment Pain Assessment: No/denies pain     Extremity/Trunk Assessment Upper Extremity Assessment Upper Extremity Assessment: Overall WFL for tasks assessed   Lower Extremity Assessment Lower Extremity Assessment: Overall WFL for tasks assessed   Cervical / Trunk Assessment Cervical / Trunk Assessment: Normal   Communication Communication Communication: No apparent difficulties   Cognition Arousal: Alert Behavior During Therapy: WFL for tasks assessed/performed Cognition: No apparent impairments                               Following commands: Intact                  Home Living Family/patient expects to be discharged to:: Private residence Living Arrangements: Spouse/significant other Available Help at Discharge: Family;Available 24 hours/day Type of Home: Apartment Home Access: Stairs to enter Entrance Stairs-Number of Steps: 14 Entrance Stairs-Rails: Right Home Layout: One level               Home Equipment: Agricultural consultant (2 wheels)          Prior Functioning/Environment Prior Level of Function : Independent/Modified Independent             Mobility Comments: has a walker at home but doesn't use it because of tight space, denies falls in past 6 months, doesn't go out bc of flight of stairs  to enter              OT Goals(Current goals can be found in the care plan section)   Acute Rehab OT Goals Patient Stated Goal: to go home OT Goal Formulation: All assessment and education complete, DC therapy   OT Frequency:       Co-evaluation              AM-PAC OT "6 Clicks" Daily Activity     Outcome Measure Help from another person eating meals?: None Help from another person taking care of personal grooming?: None Help from another person toileting, which  includes using toliet, bedpan, or urinal?: None Help from another person bathing (including washing, rinsing, drying)?: A Little Help from another person to put on and taking off regular upper body clothing?: None Help from another person to put on and taking off regular lower body clothing?: A Little 6 Click Score: 22   End of Session Equipment Utilized During Treatment: Oxygen Nurse Communication: Other (comment) (nurse in room)  Activity Tolerance: Patient tolerated treatment well Patient left: in chair;with call bell/phone within reach                   Time: 1024-1040 OT Time Calculation (min): 16 min Charges:  OT General Charges $OT Visit: 1 Visit OT Evaluation $OT Eval Low Complexity: 1 Low  Yvonna Brun OTR/L, MS Acute Rehabilitation Department Office# [phone]   Jame Maze 04/09/2024, 10:54 AM

## 2024-04-09 NOTE — Progress Notes (Signed)
   NAME:  Lindsey Williamson, MRN:  [id number], DOB:  10/24/1951, LOS: 3 ADMISSION DATE:  04/06/2024, CONSULTATION DATE: 04/08/2024 REFERRING MD: Dr. Ariel Begun, CHIEF COMPLAINT: Shortness of breath  History of Present Illness:  Lindsey Williamson is a 73 year old woman with history of lung cancer stage IIIa status post chemoradiation and immunotherapy.  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  She was hospitalized in February for similar symptoms and that was when she was started on oxygen and started on steroids.  Immunotherapy mediated pneumonitis.  She did feel better when she was on steroids.  She is now coming in with about 10 days of worsening shortness of breath on minimal exertion, cough which is nonproductive.  Denies fevers chills night sweats.  Her appetite is okay.  Denies any sick contacts.  She lives at home with her husband.  She says the grandchildren have been staying away because they have been sick.  She has been doing physical therapy at home and doing well with this but more short of breath in the last 10 days and her oxygen levels were lower on her 2 L.  Since discharge from the hospital she was given a course of steroids and antibiotics and did feel improved.  She was clinically improving but not felt to be completely improved.  She presented to the ED with low oxygen levels   Pertinent  Medical History  COPD Stage III AAA non-small cell lung cancer status postchemotherapy radiation Chronic respiratory failure  Significant Hospital Events: Including procedures, antibiotic start and stop dates in addition to other pertinent events     Interim History / Subjective:  Breathing feels stable  Objective   Blood pressure 127/74, pulse (!) 103, temperature (!) 97.5 F (36.4 C), temperature source Oral, resp. rate (!) 21, height 5\' 4"  (1.626 m), weight 64.4 kg, SpO2 96%.    FiO2 (%):  [32 %] 32  %   Intake/Output Summary (Last 24 hours) at 04/09/2024 1156 Last data filed at 04/09/2024 1025 Gross per 24 hour  Intake 483 ml  Output --  Net 483 ml   Filed Weights   04/07/24 0014 04/07/24 1527  Weight: 66 kg 64.4 kg    Examination: General: Elderly, does not appear to be in distress HENT: Moist oral mucosa Lungs: Decreased air movement bilaterally Cardiovascular: S1-S2 appreciated Abdomen: Soft, bowel sounds appreciated Extremities: No clubbing, no edema Neuro: Alert oriented GU:   Resolved Hospital Problem list     Assessment & Plan:  Acute on chronic hypoxemic respiratory failure 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

## 2024-04-10 DIAGNOSIS — J984 Other disorders of lung: Secondary | ICD-10-CM | POA: Diagnosis not present

## 2024-04-10 DIAGNOSIS — J9621 Acute and chronic respiratory failure with hypoxia: Secondary | ICD-10-CM | POA: Diagnosis not present

## 2024-04-10 LAB — BASIC METABOLIC PANEL WITH GFR
Anion gap: 8 (ref 5–15)
BUN: 17 mg/dL (ref 8–23)
CO2: 24 mmol/L (ref 22–32)
Calcium: 9.1 mg/dL (ref 8.9–10.3)
Chloride: 103 mmol/L (ref 98–111)
Creatinine, Ser: 0.53 mg/dL (ref 0.44–1.00)
GFR, Estimated: >60 mL/min (ref >60)
Glucose, Bld: 164 mg/dL — ABNORMAL HIGH (ref 70–99)
Potassium: 4.1 mmol/L (ref 3.5–5.1)
Sodium: 135 mmol/L (ref 135–145)

## 2024-04-10 LAB — CBC
HCT: 36.3 % (ref 36.0–46.0)
Hemoglobin: 10.9 g/dL — ABNORMAL LOW (ref 12.0–15.0)
MCH: 27 pg (ref 26.0–34.0)
MCHC: 30 g/dL (ref 30.0–36.0)
MCV: 90.1 fL (ref 80.0–100.0)
Platelets: 316 10*3/uL (ref 150–400)
RBC: 4.03 MIL/uL (ref 3.87–5.11)
RDW: 16.1 % — ABNORMAL HIGH (ref 11.5–15.5)
WBC: 6.2 10*3/uL (ref 4.0–10.5)
nRBC: 0 % (ref 0.0–0.2)

## 2024-04-10 LAB — MAGNESIUM: Magnesium: 1.6 mg/dL — ABNORMAL LOW (ref 1.7–2.4)

## 2024-04-10 LAB — GLUCOSE, CAPILLARY
Glucose-Capillary: 142 mg/dL — ABNORMAL HIGH (ref 70–99)
Glucose-Capillary: 267 mg/dL — ABNORMAL HIGH (ref 70–99)
Glucose-Capillary: 404 mg/dL — ABNORMAL HIGH (ref 70–99)
Glucose-Capillary: 268 mg/dL — ABNORMAL HIGH (ref 70–99)

## 2024-04-10 LAB — BRAIN NATRIURETIC PEPTIDE: B Natriuretic Peptide: 90.9 pg/mL (ref 0.0–100.0)

## 2024-04-10 MED ORDER — MAGNESIUM SULFATE 2 GM/50ML IV SOLN
2.0000 g | Freq: Once | INTRAVENOUS | Status: AC
  Administered 2024-04-10: 2 g via INTRAVENOUS
  Filled 2024-04-10: qty 50

## 2024-04-10 MED ORDER — PROSOURCE TF20 ENFIT COMPATIBL EN LIQD
60.0000 mL | Freq: Every day | ENTERAL | Status: DC
  Filled 2024-04-10: qty 60

## 2024-04-10 MED ORDER — VITAL HIGH PROTEIN PO LIQD
1000.0000 mL | ORAL | Status: DC
  Filled 2024-04-10: qty 1000

## 2024-04-10 MED ORDER — MAGNESIUM OXIDE -MG SUPPLEMENT 400 (240 MG) MG PO TABS
800.0000 mg | ORAL_TABLET | Freq: Once | ORAL | Status: AC
  Administered 2024-04-10: 800 mg via ORAL
  Filled 2024-04-10: qty 2

## 2024-04-10 MED ORDER — DEXTROSE IN LACTATED RINGERS 5 % IV SOLN: INTRAVENOUS | Status: DC

## 2024-04-10 MED ORDER — PREDNISONE 20 MG PO TABS
40.0000 mg | ORAL_TABLET | Freq: Every day | ORAL | Status: DC
  Administered 2024-04-11: 40 mg via ORAL
  Filled 2024-04-10: qty 2

## 2024-04-10 MED ORDER — INSULIN ASPART 100 UNIT/ML IJ SOLN
10.0000 [IU] | Freq: Once | INTRAMUSCULAR | Status: AC
  Administered 2024-04-10: 10 [IU] via SUBCUTANEOUS

## 2024-04-10 NOTE — Progress Notes (Signed)
 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  Upon admission found to have concerns of radiation pneumonitis versus underlying infection.  Started on antibiotics, steroids and bronchodilators.  PCCM consulted as well.   Assessment & Plan:  Principal Problem:   Pneumonitis Active Problems:   Acute on chronic hypoxic respiratory failure (HCC)   ILD (interstitial lung disease) (HCC)   COPD (chronic obstructive pulmonary disease) (HCC)   NSCLC of right lung (HCC)    Acute on chronic hypoxic respiratory failure- on 2l [place] at home Worsening right-sided airspace disease NSCLC s/p chemoradiation, immunotherapy  Recent hx immune related pneumonitis v radiation pneumonitis: Respiratory panel, COVID/RSV/flu was negative.  Still getting tachycardic and short of breath with ambulation Aspergillus - neg and Fungitell-pending Procalcitonin negative - On vancomycin and Rocephin, will change to 5 days of doxycycline - Steroids, bronchodilators    Elevated troponin-from demand ischemia due to hypoxia CAD s/p CABG Hyperlipidemia: Suspect demand ischemia.  Admitting troponin 117 which has trended down.  EKG is nonischemic.  Echocardiogram February 2024 shows EF 65 to 70%.  Prior provider discussed case with cardiology, continue to monitor. - Continue aspirin, statin, Zebeta   Hyponatremia Hypokalemia Hypomagnesemia: Replaced and resolved.   Hypertension: BP stable on home amlodipine bisoprolol IV as needed   Type 2  diabetes mellitus w/ uncontrolled hyperglycemia: Holding metformin Jardiance glipizide continue SSI for now while on steroid. Add Semglee.  Adjust as necessary  PT/OT  DVT prophylaxis: enoxaparin (LOVENOX) injection 40 mg Start: 04/07/24 1000      Code Status: Do not attempt resuscitation (DNR) PRE-ARREST INTERVENTIONS DESIRED Family Communication:   Status is: Inpatient Remains inpatient appropriate because: Hopefully discharge in next 24-48 hours pending her clinical improvement    Subjective:  Still getting short of breath with minimal ambulation and hypoxia.  Examination:  General exam: Appears calm and comfortable  Respiratory system: Diminished breath sounds bilateral Cardiovascular system: S1 & S2 heard, RRR. No JVD, murmurs, rubs, gallops or clicks. No pedal edema. Gastrointestinal system: Abdomen is nondistended, soft and nontender. No organomegaly or masses felt. Normal bowel sounds heard. Central nervous system: Alert and oriented. No focal neurological deficits. Extremities: Symmetric 5 x 5 power. Skin: No rashes, lesions or ulcers Psychiatry: Judgement and insight appear normal. Mood & affect appropriate.                Diet Orders (From admission, onward)     Start     Ordered   04/06/24 2201  Diet 2 gram sodium Room service appropriate? Yes; Fluid consistency: Thin  Diet effective now       Question Answer Comment  Room service appropriate? Yes   Fluid consistency: Thin      04/06/24 2205            Objective: Vitals:   04/10/24 0419 04/10/24 0830 04/10/24 1035 04/10/24 1129  BP: (!) 162/96  136/77   Pulse: 81  94   Resp: 17  20   Temp: (!) 97.5 F (36.4 C)  98.1 F (36.7 C)   TempSrc: Axillary  SpO2: 98% 96% 98% 94%  Weight: 65 kg     Height:        Intake/Output Summary (Last 24 hours) at 04/10/2024 1136 Last data filed at 04/10/2024 0954 Gross per 24 hour  Intake 603 ml  Output --  Net 603 ml   Filed Weights    04/07/24 0014 04/07/24 1527 04/10/24 0419  Weight: 66 kg 64.4 kg 65 kg    Scheduled Meds:  amLODipine  2.5 mg Oral Daily   aspirin EC  81 mg Oral Daily   bisoprolol  2.5 mg Oral Daily   Chlorhexidine Gluconate Cloth  6 each Topical Daily   doxycycline  100 mg Oral Q12H   enoxaparin (LOVENOX) injection  40 mg Subcutaneous Q24H   ferrous sulfate  325 mg Oral Q breakfast   guaiFENesin  600 mg Oral BID   insulin aspart  0-5 Units Subcutaneous QHS   insulin aspart  0-9 Units Subcutaneous TID WC   insulin aspart  3 Units Subcutaneous TID WC   insulin glargine-yfgn  8 Units Subcutaneous Daily   ipratropium-albuterol  3 mL Nebulization BID   loratadine  10 mg Oral Daily   pravastatin  80 mg Oral QHS   predniSONE  30 mg Oral Q breakfast   sodium chloride flush  3 mL Intravenous Q12H   Continuous Infusions:  Nutritional status     Body mass index is 24.6 kg/m.  Data Reviewed:   CBC: Recent Labs  Lab 04/06/24 1807 04/07/24 0436 04/08/24 0244 04/09/24 0417 04/10/24 0411  WBC 6.0 3.5* 7.6 6.5 6.2  NEUTROABS 4.2  --   --   --   --   HGB 11.9* 10.9* 10.6* 10.5* 10.9*  HCT 38.6 35.0* 35.8* 35.1* 36.3  MCV 87.5 87.9 90.4 89.5 90.1  PLT 334 302 323 331 316   Basic Metabolic Panel: Recent Labs  Lab 04/06/24 1807 04/06/24 2043 04/07/24 0436 04/08/24 0244 04/09/24 0417 04/10/24 0411  NA 130*  --  133* 133* 136 135  K 3.1*  --  4.5 3.8 3.7 4.1  CL 100  --  104 104 106 103  CO2 20*  --  20* 22 23 24   GLUCOSE 117*  --  235* 142* 157* 164*  BUN 11  --  16 18 19 17   CREATININE 0.54  --  0.64 0.54 0.32* 0.53  CALCIUM 8.7*  --  8.9 8.8* 9.2 9.1  MG  --  1.0* 2.4  --  1.6* 1.6*  PHOS  --   --  3.8  --  3.0  --    GFR: Estimated Creatinine Clearance: 54.1 mL/min (by C-G formula based on SCr of 0.53 mg/dL). Liver Function Tests: No results for input(s): "AST", "ALT", "ALKPHOS", "BILITOT", "PROT", "ALBUMIN" in the last 168 hours. No results for input(s): "LIPASE", "AMYLASE" in  the last 168 hours. No results for input(s): "AMMONIA" in the last 168 hours. Coagulation Profile: No results for input(s): "INR", "PROTIME" in the last 168 hours. Cardiac Enzymes: No results for input(s): "CKTOTAL", "CKMB", "CKMBINDEX", "TROPONINI" in the last 168 hours. BNP (last 3 results) No results for input(s): "PROBNP" in the last 8760 hours. HbA1C: No results for input(s): "HGBA1C" in the last 72 hours. CBG: Recent Labs  Lab 04/09/24 0738 04/09/24 1143 04/09/24 1629 04/09/24 2039 04/10/24 0741  GLUCAP 165* 230* 302* 214* 142*   Lipid Profile: No results for input(s): "CHOL", "HDL", "LDLCALC", "TRIG", "CHOLHDL", "LDLDIRECT" in the last 72 hours. Thyroid Function Tests: No results for  input(s): "TSH", "T4TOTAL", "FREET4", "T3FREE", "THYROIDAB" in the last 72 hours. Anemia Panel: No results for input(s): "VITAMINB12", "FOLATE", "FERRITIN", "TIBC", "IRON", "RETICCTPCT" in the last 72 hours. Sepsis Labs: Recent Labs  Lab 04/06/24 1817 04/07/24 0436 04/08/24 0244  PROCALCITON  --  <0.10 <0.10  LATICACIDVEN 1.1  --   --     Recent Results (from the past 240 hours)  Culture, blood (routine x 2)     Status: None (Preliminary result)   Collection Time: 04/06/24  6:05 PM   Specimen: BLOOD  Result Value Ref Range Status   Specimen Description   Final    BLOOD RIGHT ANTECUBITAL Performed at [facility], [address] [place], [place] [address]    Special Requests   Final    BOTTLES DRAWN AEROBIC AND ANAEROBIC Blood Culture adequate volume Performed at [facility], [address], [place], [place] [address]    Culture   Final    NO GROWTH 4 DAYS Performed at [facility], [address] [place], [place] [address]    Report Status PENDING  Incomplete  Culture, blood (routine x 2)     Status: None (Preliminary result)   Collection Time: 04/06/24  6:07 PM   Specimen: BLOOD  Result Value Ref Range Status   Specimen  Description   Final    BLOOD BLOOD LEFT WRIST Performed at [facility], [address], [place], [place] [address]    Special Requests   Final    BOTTLES DRAWN AEROBIC AND ANAEROBIC Blood Culture adequate volume Performed at [facility], [address], [place], [place] [address]    Culture   Final    NO GROWTH 4 DAYS Performed at [facility], [address] [place], [place] [address]    Report Status PENDING  Incomplete  Resp panel by RT-PCR (RSV, Flu A&B, Covid)     Status: None   Collection Time: 04/06/24  6:07 PM   Specimen: Nasal Swab  Result Value Ref Range Status   SARS Coronavirus 2 by RT PCR NEGATIVE NEGATIVE Final    Comment: (NOTE) SARS-CoV-2 target nucleic acids are NOT DETECTED.  The SARS-CoV-2 RNA is generally detectable in upper respiratory specimens during the acute phase of infection. The lowest concentration of SARS-CoV-2 viral copies this assay can detect is 138 copies/mL. A negative result does not preclude SARS-Cov-2 infection and should not be used as the sole basis for treatment or other patient management decisions. A negative result may occur with  improper specimen collection/handling, submission of specimen other than nasopharyngeal swab, presence of viral mutation(s) within the areas targeted by this assay, and inadequate number of viral copies(<138 copies/mL). A negative result must be combined with clinical observations, patient history, and epidemiological information. The expected result is Negative.  Fact Sheet for Patients:  BloggerCourse.com  Fact Sheet for Healthcare Providers:  SeriousBroker.it  This test is no t yet approved or cleared by the [place] FDA and  has been authorized for detection and/or diagnosis of SARS-CoV-2 by FDA under an Emergency Use Authorization (EUA). This EUA will remain  in effect (meaning this test can be used)  for the duration of the COVID-19 declaration under Section 564(b)(1) of the Act, 21 U.S.C.section 360bbb-3(b)(1), unless the authorization is terminated  or revoked sooner.       Influenza A by PCR NEGATIVE NEGATIVE Final   Influenza B by PCR NEGATIVE NEGATIVE Final    Comment: (  NOTE) The Xpert Xpress SARS-CoV-2/FLU/RSV plus assay is intended as an aid in the diagnosis of influenza from Nasopharyngeal swab specimens and should not be used as a sole basis for treatment. Nasal washings and aspirates are unacceptable for Xpert Xpress SARS-CoV-2/FLU/RSV testing.  Fact Sheet for Patients: BloggerCourse.com  Fact Sheet for Healthcare Providers: SeriousBroker.it  This test is not yet approved or cleared by the [place]  FDA and has been authorized for detection and/or diagnosis of SARS-CoV-2 by FDA under an Emergency Use Authorization (EUA). This EUA will remain in effect (meaning this test can be used) for the duration of the COVID-19 declaration under Section 564(b)(1) of the Act, 21 U.S.C. section 360bbb-3(b)(1), unless the authorization is terminated or revoked.     Resp Syncytial Virus by PCR NEGATIVE NEGATIVE Final    Comment: (NOTE) Fact Sheet for Patients: BloggerCourse.com  Fact Sheet for Healthcare Providers: SeriousBroker.it  This test is not yet approved or cleared by the [place]  FDA and has been authorized for detection and/or diagnosis of SARS-CoV-2 by FDA under an Emergency Use Authorization (EUA). This EUA will remain in effect (meaning this test can be used) for the duration of the COVID-19 declaration under Section 564(b)(1) of the Act, 21 U.S.C. section 360bbb-3(b)(1), unless the authorization is terminated or revoked.  Performed at [facility], [address] [place], [place] [address]   Respiratory (~20 pathogens) panel by PCR      Status: None   Collection Time: 04/06/24  6:07 PM   Specimen: Nasopharyngeal Swab; Respiratory  Result Value Ref Range Status   Adenovirus NOT DETECTED NOT DETECTED Final   Coronavirus 229E NOT DETECTED NOT DETECTED Final    Comment: (NOTE) The Coronavirus on the Respiratory Panel, DOES NOT test for the novel  Coronavirus (2019 nCoV)    Coronavirus HKU1 NOT DETECTED NOT DETECTED Final   Coronavirus NL63 NOT DETECTED NOT DETECTED Final   Coronavirus OC43 NOT DETECTED NOT DETECTED Final   Metapneumovirus NOT DETECTED NOT DETECTED Final   Rhinovirus / Enterovirus NOT DETECTED NOT DETECTED Final   Influenza A NOT DETECTED NOT DETECTED Final   Influenza B NOT DETECTED NOT DETECTED Final   Parainfluenza Virus 1 NOT DETECTED NOT DETECTED Final   Parainfluenza Virus 2 NOT DETECTED NOT DETECTED Final   Parainfluenza Virus 3 NOT DETECTED NOT DETECTED Final   Parainfluenza Virus 4 NOT DETECTED NOT DETECTED Final   Respiratory Syncytial Virus NOT DETECTED NOT DETECTED Final   Bordetella pertussis NOT DETECTED NOT DETECTED Final   Bordetella Parapertussis NOT DETECTED NOT DETECTED Final   Chlamydophila pneumoniae NOT DETECTED NOT DETECTED Final   Mycoplasma pneumoniae NOT DETECTED NOT DETECTED Final    Comment: Performed at [facility], [address], [place], [place] [address]  Aspergillus Ag, BAL/Serum     Status: None   Collection Time: 04/07/24  4:36 AM   Specimen: Vein; Blood  Result Value Ref Range Status   Aspergillus Ag, BAL/Serum 0.08 0.00 - 0.49 Index Final    Comment: (NOTE) Performed At: [name] [address] [place], [place] [id number] Pearlean Botts MD NU:[id number]   MRSA Next Gen by PCR, Nasal     Status: None   Collection Time: 04/07/24  3:32 PM   Specimen: Nasal Mucosa; Nasal Swab  Result Value Ref Range Status   MRSA by PCR Next Gen NOT DETECTED NOT DETECTED Final    Comment: (NOTE) The GeneXpert MRSA Assay (FDA approved for NASAL specimens  only), is one  component of a comprehensive MRSA colonization surveillance program. It is not intended to diagnose MRSA infection nor to guide or monitor treatment for MRSA infections. Test performance is not FDA approved in patients less than 57 years old. Performed at [facility], [address], [place], [place] [address]   Expectorated Sputum Assessment w Gram Stain, Rflx to Resp Cult     Status: None   Collection Time: 04/08/24  6:58 AM   Specimen: Expectorated Sputum  Result Value Ref Range Status   Specimen Description EXPECTORATED SPUTUM  Final   Special Requests NONE  Final   Sputum evaluation   Final    Sputum specimen not acceptable for testing.  Please recollect.   Claudene Crystal RN @ [phone] 04/08/24 Performed at [facility], [address], [place], [place] [address]    Report Status 04/08/2024 FINAL  Final         Radiology Studies: No results found.         LOS: 4 days   Time spent= 35 mins    Maggie Schooner, MD [facility]  If 7PM-7AM, please contact night-coverage  04/10/2024, 11:36 AM

## 2024-04-10 NOTE — Progress Notes (Signed)
   NAME:  Lindsey Williamson, MRN:  [id number], DOB:  10-10-1951, LOS: 4 ADMISSION DATE:  04/06/2024, CONSULTATION DATE: 04/08/2024 REFERRING MD: Dr. Nelson Chimes, CHIEF COMPLAINT: Shortness of breath  History of Present Illness:  Lindsey Williamson is a 73 year old woman with history of lung cancer stage IIIa status post chemoradiation and immunotherapy.  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  She was hospitalized in February for similar symptoms and that was when she was started on oxygen and started on steroids.  Immunotherapy mediated pneumonitis.  She did feel better when she was on steroids.  She is now coming in with about 10 days of worsening shortness of breath on minimal exertion, cough which is nonproductive.  Denies fevers chills night sweats.  Her appetite is okay.  Denies any sick contacts.  She lives at home with her husband.  She says the grandchildren have been staying away because they have been sick.  She has been doing physical therapy at home and doing well with this but more short of breath in the last 10 days and her oxygen levels were lower on her 2 L.  Since discharge from the hospital she was given a course of steroids and antibiotics and did feel improved.  She was clinically improving but not felt to be completely improved.  She presented to the ED with low oxygen levels   Pertinent  Medical History  COPD Stage III AAA non-small cell lung cancer status postchemotherapy radiation Chronic respiratory failure  Significant Hospital Events: Including procedures, antibiotic start and stop dates in addition to other pertinent events     Interim History / Subjective:  Breathing feels fair Ambulating in hallway  Objective   Blood pressure 127/69, pulse 92, temperature (!) 97.3 F (36.3 C), resp. rate 16, height 5\' 4"  (1.626 m), weight 65 kg, SpO2 99%.        Intake/Output Summary (Last  24 hours) at 04/10/2024 1321 Last data filed at 04/10/2024 0954 Gross per 24 hour  Intake 243 ml  Output --  Net 243 ml   Filed Weights   04/07/24 0014 04/07/24 1527 04/10/24 0419  Weight: 66 kg 64.4 kg 65 kg    Examination: General: Elderly, does not appear to be in distress HENT: Moist oral mucosa Lungs: Decreased air movement bilaterally Cardiovascular: S1-S2 appreciated Abdomen: Soft, bowel sounds appreciated Extremities: No clubbing, no edema Neuro: Alert oriented GU:   I reviewed nursing notes, Consultant notes, hospitalist notes, last 24 h vitals and pain scores, last 48 h intake and output, last 24 h labs and trends, and last 24 h imaging results.  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

## 2024-04-10 NOTE — Plan of Care (Signed)

## 2024-04-10 NOTE — Plan of Care (Signed)

## 2024-04-10 NOTE — Progress Notes (Signed)
 Mobility Specialist - Progress Note   04/10/24 1129  Oxygen Therapy  SpO2 94 %  O2 Device Nasal Cannula  O2 Flow Rate (L/min) 3 L/min  Patient Activity (if Appropriate) Ambulating  Mobility  Activity Ambulated with assistance in hallway  Level of Assistance Standby assist, set-up cues, supervision of patient - no hands on  Assistive Device Front wheel walker  Distance Ambulated (ft) 160 ft  Activity Response Tolerated well  Mobility Referral Yes  Mobility visit 1 Mobility  Mobility Specialist Start Time (ACUTE ONLY) 1109  Mobility Specialist Stop Time (ACUTE ONLY) 1128  Mobility Specialist Time Calculation (min) (ACUTE ONLY) 19 min   Pt received in bed and agreeable to mobility. No complaints during session. Pt to recliner for meal after session with all needs met.    Pre-mobility: 88 HR, 98% SpO2 (3L [place]) During mobility: 101 HR, 94%  SpO2 (3L [place]) Post-mobility: 98 HR, 92% SPO2 (3L [place])  Chief Technology Officer

## 2024-04-11 ENCOUNTER — Other Ambulatory Visit: Payer: Self-pay | Admitting: Pulmonary Disease

## 2024-04-11 ENCOUNTER — Other Ambulatory Visit (HOSPITAL_COMMUNITY): Payer: Self-pay

## 2024-04-11 DIAGNOSIS — J984 Other disorders of lung: Secondary | ICD-10-CM | POA: Diagnosis not present

## 2024-04-11 DIAGNOSIS — J9621 Acute and chronic respiratory failure with hypoxia: Secondary | ICD-10-CM | POA: Diagnosis not present

## 2024-04-11 LAB — FUNGITELL BETA-D-GLUCAN
Fungitell Value:: 90.829 pg/mL
Result Name:: POSITIVE — AB

## 2024-04-11 LAB — CULTURE, BLOOD (ROUTINE X 2)
Culture: NO GROWTH
Culture: NO GROWTH
Special Requests: ADEQUATE
Special Requests: ADEQUATE

## 2024-04-11 LAB — BASIC METABOLIC PANEL WITH GFR
Anion gap: 9 (ref 5–15)
BUN: 19 mg/dL (ref 8–23)
CO2: 25 mmol/L (ref 22–32)
Calcium: 8.8 mg/dL — ABNORMAL LOW (ref 8.9–10.3)
Chloride: 98 mmol/L (ref 98–111)
Creatinine, Ser: 0.62 mg/dL (ref 0.44–1.00)
GFR, Estimated: >60 mL/min (ref >60)
Glucose, Bld: 317 mg/dL — ABNORMAL HIGH (ref 70–99)
Potassium: 4.1 mmol/L (ref 3.5–5.1)
Sodium: 132 mmol/L — ABNORMAL LOW (ref 135–145)

## 2024-04-11 LAB — CBC
HCT: 37 % (ref 36.0–46.0)
Hemoglobin: 11.2 g/dL — ABNORMAL LOW (ref 12.0–15.0)
MCH: 27.3 pg (ref 26.0–34.0)
MCHC: 30.3 g/dL (ref 30.0–36.0)
MCV: 90 fL (ref 80.0–100.0)
Platelets: 341 10*3/uL (ref 150–400)
RBC: 4.11 MIL/uL (ref 3.87–5.11)
RDW: 16.1 % — ABNORMAL HIGH (ref 11.5–15.5)
WBC: 6.4 10*3/uL (ref 4.0–10.5)
nRBC: 0 % (ref 0.0–0.2)

## 2024-04-11 LAB — GLUCOSE, CAPILLARY
Glucose-Capillary: 203 mg/dL — ABNORMAL HIGH (ref 70–99)
Glucose-Capillary: 167 mg/dL — ABNORMAL HIGH (ref 70–99)

## 2024-04-11 LAB — MAGNESIUM: Magnesium: 2 mg/dL (ref 1.7–2.4)

## 2024-04-11 MED ORDER — INSULIN ASPART 100 UNIT/ML FLEXPEN
0.0000 [IU] | PEN_INJECTOR | Freq: Three times a day (TID) | SUBCUTANEOUS | 0 refills | Status: DC
  Filled 2024-04-11: qty 15, 30d supply, fill #0

## 2024-04-11 MED ORDER — ANORO ELLIPTA 62.5-25 MCG/ACT IN AEPB: 1.0000 | INHALATION_SPRAY | Freq: Every day | RESPIRATORY_TRACT | 3 refills | Status: DC

## 2024-04-11 MED ORDER — INSULIN GLARGINE-YFGN 100 UNIT/ML ~~LOC~~ SOLN: 10.0000 [IU] | Freq: Every day | SUBCUTANEOUS | Status: DC

## 2024-04-11 MED ORDER — INSULIN PEN NEEDLE 31G X 5 MM MISC
0 refills | Status: DC
  Filled 2024-04-11: qty 100, 30d supply, fill #0

## 2024-04-11 MED ORDER — ALBUTEROL SULFATE HFA 108 (90 BASE) MCG/ACT IN AERS
2.0000 | INHALATION_SPRAY | Freq: Four times a day (QID) | RESPIRATORY_TRACT | 0 refills | Status: DC | PRN
  Filled 2024-04-11: qty 6.7, 30d supply, fill #0

## 2024-04-11 MED ORDER — GLIPIZIDE ER 5 MG PO TB24
5.0000 mg | ORAL_TABLET | Freq: Every day | ORAL | 0 refills | Status: DC
  Filled 2024-04-11: qty 30, 30d supply, fill #0

## 2024-04-11 MED ORDER — DOXYCYCLINE HYCLATE 100 MG PO TABS
100.0000 mg | ORAL_TABLET | Freq: Two times a day (BID) | ORAL | 0 refills | Status: AC
  Filled 2024-04-11: qty 4, 2d supply, fill #0

## 2024-04-11 MED ORDER — INSULIN ASPART 100 UNIT/ML IJ SOLN: 6.0000 [IU] | Freq: Three times a day (TID) | INTRAMUSCULAR | Status: DC

## 2024-04-11 MED ORDER — PREDNISONE 10 MG PO TABS
ORAL_TABLET | ORAL | 0 refills | Status: DC
  Filled 2024-04-11: qty 121, 58d supply, fill #0

## 2024-04-11 MED ORDER — IPRATROPIUM-ALBUTEROL 0.5-2.5 (3) MG/3ML IN SOLN
3.0000 mL | Freq: Two times a day (BID) | RESPIRATORY_TRACT | 0 refills | Status: DC
  Filled 2024-04-11: qty 180, 30d supply, fill #0

## 2024-04-11 MED ORDER — SULFAMETHOXAZOLE-TRIMETHOPRIM 800-160 MG PO TABS
1.0000 | ORAL_TABLET | ORAL | 0 refills | Status: DC
  Filled 2024-04-11: qty 36, 84d supply, fill #0

## 2024-04-11 MED ORDER — ANORO ELLIPTA 62.5-25 MCG/ACT IN AEPB
1.0000 | INHALATION_SPRAY | Freq: Every day | RESPIRATORY_TRACT | 0 refills | Status: DC
  Filled 2024-04-11: qty 60, 30d supply, fill #0

## 2024-04-11 NOTE — Inpatient Diabetes Management (Signed)
 Inpatient Diabetes Program Recommendations  AACE/ADA: New Consensus Statement on Inpatient Glycemic Control (2015)  Target Ranges:  Prepandial:   less than 140 mg/dL      Peak postprandial:   less than 180 mg/dL (1-2 hours)      Critically ill patients:  140 - 180 mg/dL   Lab Results  Component Value Date   GLUCAP 203 (H) 04/11/2024   HGBA1C 7.4 (H) 02/16/2024    Review of Glycemic Control  Latest Reference Range & Units 04/10/24 07:41 04/10/24 12:01 04/10/24 17:48 04/10/24 21:39 04/11/24 07:54  Glucose-Capillary 70 - 99 mg/dL 161 (H) 096 (H) 045 (H) 268 (H) 203 (H)  (H): Data is abnormally high  Diabetes history: DM2 Outpatient Diabetes medications: Jardiance 25 mg daily, Glucotrol 5 mg daily, Metformin 1 gm bid Current orders for Inpatient glycemic control:Semglee 8 units daily , Novolog 0-9 units tid, 0-5 units hs, Novolog 3 units TID, Solumedrol 60 daily then decrease to Prednisone 30   Inpatient Diabetes Program Recommendations:    If steroids continue, might consider:  Novlog 6 units TID with meals  Semglee 10 units every day  Will continue to follow while inpatient.  Thank you, Hays Lipschutz, MSN, CDCES Diabetes Coordinator Inpatient Diabetes Program [phone] (team pager from 8a-5p)

## 2024-04-11 NOTE — Plan of Care (Signed)

## 2024-04-11 NOTE — Progress Notes (Signed)
 Brief Nutrition Note  Received consult for enteral nutrition initiation and management. Per discussion with MD, no need for tube feeds or nutrition consult at this time. Patient on a regular textured diet and documented to be consuming 75-100% of meals. Weight stable since November. Patient to likely discharge home in the next 24-48 hours. Will sign off. Please re-consult as needed.   Scheryl Cushing RD, LDN Contact via [employer].

## 2024-04-11 NOTE — Care Management Important Message (Signed)
 Important Message  Patient Details IM Letter was given. Name: Lindsey Williamson MRN: [id number] Date of Birth: [birth year]   Important Message Given:  Yes - Medicare IM     Curtiss Dowdy 04/11/2024, 1:29 PM

## 2024-04-11 NOTE — TOC Initial Note (Signed)
 Transition of Care [facility]) - Initial/Assessment Note    Patient Details  Name: Lindsey Williamson MRN: [id number] Date of Birth: 1951/10/09  Transition of Care [facility]) CM/SW Contact:    Jessie Foot, RN Phone Number: 04/11/2024, 10:50 AM  Clinical Narrative:                 Presented for SOB home home with spouse. Lives in apartment. Verified PCP & insurance. DME oxygen with [employer] and HH PT/OT/ with [facility]. Patient spouse will transport home via private vehicle. TOC will follow for oxygen  Expected Discharge Plan: Home w Home Health Services     Patient Goals and CMS Choice Patient states their goals for this hospitalization and ongoing recovery are:: Home with [facility]          Expected Discharge Plan and Services   Discharge Planning Services: CM Consult   Living arrangements for the past 2 months: Apartment Expected Discharge Date: 04/11/24                                    Prior Living Arrangements/Services Living arrangements for the past 2 months: Apartment Lives with:: Spouse Patient language and need for interpreter reviewed:: Yes Do you feel safe going back to the place where you live?: Yes        Care giver support system in place?: Yes (comment) Current home services: DME, Homehealth aide, Home OT, Home PT ([facility])    Activities of Daily Living   ADL Screening (condition at time of admission) Independently performs ADLs?: No Does the patient have a NEW difficulty with bathing/dressing/toileting/self-feeding that is expected to last >3 days?: No Does the patient have a NEW difficulty with getting in/out of bed, walking, or climbing stairs that is expected to last >3 days?: Yes (Initiates electronic notice to provider for possible PT consult) Does the patient have a NEW difficulty with communication that is expected to last >3 days?: No Is the patient deaf or have difficulty hearing?: No Does the patient have difficulty seeing, even when  wearing glasses/contacts?: No Does the patient have difficulty concentrating, remembering, or making decisions?: No  Permission Sought/Granted Permission sought to share information with : Case Manager    Share Information with NAME: [employer] [facility]           Emotional Assessment Appearance:: Appears stated age Attitude/Demeanor/Rapport: Engaged Affect (typically observed): Accepting, Appropriate Orientation: : Oriented to Place, Oriented to  Time, Oriented to Situation Alcohol / Substance Use: Not Applicable Psych Involvement: No (comment)  Admission diagnosis:  Hypokalemia [E87.6] Hypomagnesemia [E83.42] Pneumonitis [J98.4] Elevated troponin [R79.89] COPD exacerbation (HCC) [J44.1] Community acquired pneumonia of right lower lobe of lung [J18.9] Patient Active Problem List   Diagnosis Date Noted   Pneumonitis 04/06/2024   Acute on chronic hypoxic respiratory failure (HCC) 04/06/2024   ILD (interstitial lung disease) (HCC) 04/06/2024   COPD (chronic obstructive pulmonary disease) (HCC) 04/06/2024   NSCLC of right lung (HCC) 04/06/2024   Hypoxia 02/13/2024   Encounter for antineoplastic immunotherapy 02/09/2024   Encounter for antineoplastic chemotherapy 11/15/2023   Goals of care, counseling/discussion 10/25/2023   SOB (shortness of breath) 10/18/2023   Angioedema 10/17/2023   Bleeding per rectum 07/30/2022   Diverticular disease of colon 07/30/2022   First degree hemorrhoids 07/30/2022   Hyperglycemia due to type 2 diabetes mellitus (HCC) 07/30/2022   Primary cancer of right lower lobe of lung (HCC)  04/08/2022   Lung nodule 03/22/2022   Dizziness 05/19/2020   Allergic rhinitis with a predominant nonallergic component. 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   Left foot pain 08/24/2019   Nasal turbinate hypertrophy 10/16/2018   Trigeminal neuralgia 10/16/2018   Chronic nonintractable  headache 09/25/2018   Sinusitis 09/25/2018   Arthritis 11/11/2017   Osteopenia 05/09/2017   Atrophic vaginitis 11/10/2016   Type 2 diabetes mellitus with microalbuminuria (HCC) 04/23/2015   Tobacco abuse 12/16/2014   Hyperlipidemia 12/12/2013   Hypertension 12/12/2013   PCP:  Claudell Cruz, MD Pharmacy:   [facility] [id number] Jonette Nestle, [place] - [address] AT [facility] [address] [place] [address] Phone: [phone] Fax: [phone]  [place] - [facility] [address] [place] [address] Phone: [phone] Fax: [phone]     Social Drivers of Health (SDOH) Social History: SDOH Screenings   Food Insecurity: No Food Insecurity (04/08/2024)  Housing: Low Risk  (04/08/2024)  Transportation Needs: No Transportation Needs (04/08/2024)  Utilities: Not At Risk (04/08/2024)  Financial Resource Strain: Low Risk  (01/25/2024)   Received from [facility]  Physical Activity: Insufficiently Active (01/25/2024)   Received from [facility]  Social Connections: Socially Integrated (04/08/2024)  Stress: Stress Concern Present (01/25/2024)   Received from [facility]  Tobacco Use: Medium Risk (04/07/2024)   SDOH Interventions:     Readmission Risk Interventions    02/18/2024    9:51 AM  Readmission Risk Prevention Plan  Transportation Screening Complete  PCP or Specialist Appt within 3-5 Days Complete  HRI or Home Care Consult Complete  Social Work Consult for Recovery Care Planning/Counseling Complete  Palliative Care Screening Not Applicable  Medication Review Oceanographer) Complete

## 2024-04-11 NOTE — Progress Notes (Signed)
 Discharge medications delivered to patient at bedside [facility]

## 2024-04-11 NOTE — Progress Notes (Signed)
   NAME:  Lindsey Williamson, MRN:  [id number], DOB:  06/05/1951, LOS: 5 ADMISSION DATE:  04/06/2024, CONSULTATION DATE: 04/08/2024 REFERRING MD: Dr. Nelson Chimes, CHIEF COMPLAINT: Shortness of breath  History of Present Illness:  Lindsey Williamson is a 73 year old woman with history of lung cancer stage IIIa status post chemoradiation and immunotherapy.  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  She was hospitalized in February for similar symptoms and that was when she was started on oxygen and started on steroids.  Immunotherapy mediated pneumonitis.  She did feel better when she was on steroids.  She is now coming in with about 10 days of worsening shortness of breath on minimal exertion, cough which is nonproductive.  Denies fevers chills night sweats.  Her appetite is okay.  Denies any sick contacts.  She lives at home with her husband.  She says the grandchildren have been staying away because they have been sick.  She has been doing physical therapy at home and doing well with this but more short of breath in the last 10 days and her oxygen levels were lower on her 2 L.  Since discharge from the hospital she was given a course of steroids and antibiotics and did feel improved.  She was clinically improving but not felt to be completely improved.  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 rate 18, height 5\' 4"  (1.626 m), weight 65 kg, SpO2  99%.        Intake/Output Summary (Last 24 hours) at 04/11/2024 1027 Last data filed at 04/10/2024 2102 Gross per 24 hour  Intake 413 ml  Output --  Net 413 ml   Filed Weights   04/07/24 0014 04/07/24 1527 04/10/24 0419  Weight: 66 kg 64.4 kg 65 kg    Examination: General: Elderly, does not appear to be in distress HENT: Moist oral mucosa Lungs: Decreased air movement bilaterally Cardiovascular: S1-S2 appreciated Abdomen: Bowel sounds appreciated Extremities: No clubbing, no edema Neuro: Alert oriented GU:   I reviewed nursing notes, Consultant notes, hospitalist notes, last 24 h vitals and pain scores, last 48 h intake and output, last 24 h labs and trends, and last 24 h imaging results.  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

## 2024-04-11 NOTE — Discharge Summary (Signed)
 Physician Discharge Summary  Lindsey Williamson EXB:[id number] DOB: [birth year] DOA: 04/06/2024  PCP: Claudell Cruz, MD  Admit date: 04/06/2024 Discharge date: 04/11/2024  Admitted From: Home Disposition: Home  Recommendations for Outpatient Follow-up:  Follow up with PCP in 1-2 weeks Please obtain BMP/CBC in one week your next doctors visit.  Bronchodilators prescribed.  2 more days of p.o. doxycycline Continue home supplemental oxygen Bactrim 3 times weekly for prophylaxis Prednisone 40 mg daily for 2 weeks followed by 10 mg taper every week thereafter 10 mg indefinitely. 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  Upon admission found to have concerns of radiation pneumonitis versus underlying infection.  Started on antibiotics, steroids and bronchodilators.  PCCM consulted as well. After several days patient's condition remained stable and it was determined to discharge her home with prolonged course of steroid, bronchodilator and prophylactic course of Bactrim.  She will also require 2 L of nasal cannula at home. Otherwise she is medically stable for discharge Her husband has been updated by me on the day of discharge  Assessment & Plan:  Principal Problem:   Pneumonitis Active Problems:   Acute on chronic hypoxic respiratory failure (HCC)   ILD (interstitial lung disease) (HCC)   COPD (chronic obstructive pulmonary disease) (HCC)   NSCLC of right lung (HCC)     Acute on chronic hypoxic respiratory failure- on 2l [place] at home Worsening right-sided airspace disease NSCLC s/p chemoradiation, immunotherapy  Recent hx immune related pneumonitis v radiation pneumonitis: COVID, RSV, flu, Aspergillus is all negative.  Unfortunately most of her condition is secondary to her underlying malignancy followed by chemo/radiation and immunotherapy.  Unlikely this will be reversible.  She will continue to require some form of oxygen at home between 2-4 L nasal cannula. - Will discharge her on prolonged course of prednisone.  40 mg for 2 weeks followed by 10 mg taper every weekly thereafter continue 10 mg daily.  Bronchodilators have been prescribed.  Prophylactic Bactrim 3 times weekly prescribed - Seen by pulmonary   Elevated troponin-from demand ischemia due to hypoxia CAD s/p CABG Hyperlipidemia: Suspect demand ischemia.  Admitting troponin 117 which has trended down.  EKG is nonischemic.  Echocardiogram February 2024 shows EF 65 to 70%.  Prior provider discussed case with cardiology, continue to monitor. - Continue aspirin, statin, Zebeta   Hyponatremia Hypokalemia Hypomagnesemia: Replaced and resolved.   Hypertension: BP stable on home amlodipine bisoprolol IV as needed   Type 2 diabetes mellitus w/ uncontrolled hyperglycemia: Resume metformin, Jardiance and glipizide.  I do expect some episodes of hyperglycemia as patient will be on steroid therefore I will prescribe corrective scale with instructions  PT/OT  DVT prophylaxis: enoxaparin (LOVENOX) injection 40 mg Start: 04/07/24 1000      Code Status: Do not attempt resuscitation (DNR) PRE-ARREST INTERVENTIONS DESIRED Family Communication: Home Status is: Inpatient Remains inpatient appropriate because: Discharge today  Subjective:  No complaints.  Occasional anxiety given her prognosis  Examination:  General exam: Appears calm and comfortable, 2 L nasal cannula Respiratory system:  Diminished breath sounds bilateral, improved  and stable Cardiovascular system: S1 & S2 heard, RRR. No JVD, murmurs, rubs, gallops or clicks. No pedal edema. Gastrointestinal system: Abdomen is nondistended, soft and nontender. No organomegaly or masses felt. Normal bowel sounds heard. Central nervous system: Alert and oriented. No focal neurological deficits. Extremities: Symmetric 5 x 5 power. Skin: No rashes, lesions or ulcers Psychiatry: Judgement and insight appear normal. Mood & affect appropriate.    Discharge Diagnoses:  Principal Problem:   Pneumonitis Active Problems:   Acute on chronic hypoxic respiratory failure (HCC)   ILD (interstitial lung disease) (HCC)   COPD (chronic obstructive pulmonary disease) (HCC)   NSCLC of right lung (HCC)      Discharge Exam: Vitals:   04/11/24 0431 04/11/24 0758  BP: (!) 158/96   Pulse: 83   Resp: 18   Temp: (!) 97.5 F (36.4 C)   SpO2: 100% 99%   Vitals:   04/10/24 2058 04/10/24 2134 04/11/24 0431 04/11/24 0758  BP:  (!) 146/93 (!) 158/96   Pulse: 88 91 83   Resp: 18 20 18    Temp:  97.7 F (36.5 C) (!) 97.5 F (36.4 C)   TempSrc:  Oral Oral   SpO2: 95% 100% 100% 99%  Weight:      Height:          Discharge Instructions  Discharge Instructions     For home use only DME Nebulizer machine   Complete by: As directed    Patient needs a nebulizer to treat with the following condition: COPD (chronic obstructive pulmonary disease) (HCC)   Length of Need: Lifetime   Additional equipment included:  Administration kit Filter     meds to beds pharmacy consult ([facility] ONLY)   Complete by: As directed       Allergies as of 04/11/2024       Reactions   Benzonatate Palpitations   Lisinopril Swelling, Other (See Comments)   Patient ended up in the ED with a swollen mouth and tongue   Aspirin Nausea And Vomiting, Palpitations   Can tolerate baby aspirin 81 mg without difficulty but can't tolerate higher dosages    Azithromycin Palpitations   Codeine Palpitations        Medication List     STOP taking these medications    pantoprazole 40 MG tablet Commonly known as: PROTONIX   potassium chloride SA 20 MEQ tablet Commonly known as: KLOR-CON M   sucralfate 1 g tablet Commonly known as: CARAFATE       TAKE these medications    albuterol 108 (90 Base) MCG/ACT inhaler Commonly known as: VENTOLIN HFA Inhale 2 puffs into the lungs every 6 (six) hours as needed for wheezing or shortness of breath.   amLODipine 5 MG tablet Commonly known as: NORVASC Take 2.5 mg by mouth in the morning.   Anoro Ellipta 62.5-25 MCG/ACT Aepb Generic drug: umeclidinium-vilanterol Inhale 1 puff into the lungs daily.   aspirin EC 81 MG tablet Take 81 mg by mouth daily. Swallow whole.   azelastine 0.1 % nasal spray Commonly known as: ASTELIN Place 2 sprays into both nostrils 2 (two) times daily as needed for rhinitis.   bisoprolol 5 MG tablet Commonly known as: ZEBETA Take 2.5 mg by mouth daily.   cetirizine 10 MG tablet Commonly known as: ZyrTEC Allergy Take 1 tablet (10 mg total) by mouth daily.   doxycycline 100 MG tablet Commonly known as: VIBRA-TABS Take 1 tablet (100 mg total) by mouth every 12 (twelve) hours for 2 days.  What changed: when to take this   empagliflozin 25 MG Tabs tablet Commonly known as: JARDIANCE Take 25 mg by mouth daily at 6 PM.   ferrous sulfate 325 (65 FE) MG tablet Take 325 mg by mouth daily with breakfast.   fluticasone 50 MCG/ACT nasal spray Commonly known as: FLONASE Place 1 spray into both nostrils daily. What changed:  when to take this reasons to take this   glipiZIDE 5 MG 24 hr tablet Commonly known as: GLUCOTROL XL Take 1 tablet (5 mg total) by mouth daily with breakfast.   guaiFENesin 600 MG 12 hr tablet Commonly known as: Mucinex Take 1 tablet (600 mg total) by mouth 2 (two) times daily.   guaiFENesin-dextromethorphan 100-10 MG/5ML  syrup Commonly known as: ROBITUSSIN DM Take 5 mLs by mouth every 4 (four) hours as needed for cough.   insulin aspart 100 UNIT/ML FlexPen Commonly known as: NOVOLOG Inject 0-11 Units into the skin 3 (three) times daily with meals. Check Blood Glucose (BG) and inject per scale: BG <150= 0 unit; BG 150-200= 1 unit; BG 201-250= 3 unit; BG 251-300= 5 unit; BG 301-350= 7 unit; BG 351-400= 9 unit; BG >400= 11 unit and Call Primary Care.   ipratropium-albuterol 0.5-2.5 (3) MG/3ML Soln Commonly known as: DUONEB Take 3 mLs by nebulization 2 (two) times daily.   metFORMIN 500 MG 24 hr tablet Commonly known as: GLUCOPHAGE-XR Take 1,000 mg by mouth in the morning and at bedtime.   nystatin powder Commonly known as: MYCOSTATIN/NYSTOP Apply 1 Application topically 3 (three) times daily.   PEG 3350 17 g Pack Take 17 g packet by mouth daily.   pravastatin 80 MG tablet Commonly known as: PRAVACHOL Take 80 mg by mouth at bedtime.   predniSONE 10 MG tablet Commonly known as: DELTASONE Take 4 tablets (40 mg total) by mouth daily with breakfast for 14 days, THEN 3 tablets (30 mg total) daily with breakfast for 7 days, THEN 2 tablets (20 mg total) daily with breakfast for 7 days, THEN 1 tablet (10 mg total) daily with breakfast. Start taking on: April 12, 2024 What changed:  medication strength See the new instructions.   prochlorperazine 10 MG tablet Commonly known as: COMPAZINE TAKE 1 TABLET(10 MG) BY MOUTH EVERY 6 HOURS AS NEEDED   sulfamethoxazole-trimethoprim 800-160 MG tablet Commonly known as: Bactrim DS Take 1 tablet by mouth 3 (three) times a week.               Durable Medical Equipment  (From admission, onward)           Start     Ordered   04/11/24 0000  For home use only DME Nebulizer machine       Question Answer Comment  Patient needs a nebulizer to treat with the following condition COPD (chronic obstructive pulmonary disease) (HCC)   Length of Need Lifetime    Additional equipment included Administration kit   Additional equipment included Filter      04/11/24 1020            Follow-up Information     Claudell Cruz, MD Follow up in 1 week(s).   Specialty: Family Medicine Contact information: [address] [place] [address] [phone]         Claudell Cruz, MD Follow up in 1 week(s).   Specialty: Family Medicine Contact information: [address] [place] [address] [phone]         [facility],  [facility] Follow up.   Specialty: Home Health Services Why: Will call to schedule PT OT Aid Contact information: [address] [place] [address] [phone]         [facility] Follow up.   Why: Nebulizer will be delievered to patients room pior to discharge. Contact information: Address: [address], [place], [place] [address] Phone: [phone]               Allergies  Allergen Reactions   Benzonatate Palpitations   Lisinopril Swelling and Other (See Comments)    Patient ended up in the ED with a swollen mouth and tongue   Aspirin Nausea And Vomiting and Palpitations    Can tolerate baby aspirin 81 mg without difficulty but can't tolerate higher dosages   Azithromycin Palpitations   Codeine Palpitations    You were cared for by a hospitalist during your hospital stay. If you have any questions about your discharge medications or the care you received while you were in the hospital after you are discharged, you can call the unit and asked to speak with the hospitalist on call if the hospitalist that took care of you is not available. Once you are discharged, your primary care physician will handle any further medical issues. Please note that no refills for any discharge medications will be authorized once you are discharged, as it is imperative that you return to your primary care physician (or establish a relationship  with a primary care physician if you do not have one) for your aftercare needs so that they can reassess your need for medications and monitor your lab values.  You were cared for by a hospitalist during your hospital stay. If you have any questions about your discharge medications or the care you received while you were in the hospital after you are discharged, you can call the unit and asked to speak with the hospitalist on call if the hospitalist that took care of you is not available. Once you are discharged, your primary care physician will handle any further medical issues. Please note that NO REFILLS for any discharge medications will be authorized once you are discharged, as it is imperative that you return to your primary care physician (or establish a relationship with a primary care physician if you do not have one) for your aftercare needs so that they can reassess your need for medications and monitor your lab values.  Please request your Prim.MD to go over all Hospital Tests and Procedure/Radiological results at the follow up, please get all Hospital records sent to your Prim MD by signing hospital release before you go home.  Get CBC, CMP, 2 view Chest X ray checked  by Primary MD during your next visit or SNF MD in 5-7 days ( we routinely change or add medications that can affect your baseline labs and fluid status, therefore we recommend that you get the mentioned basic workup next visit with your PCP, your PCP may decide not to get them or add new tests based on their clinical decision)  On your next visit with your primary care physician please Get Medicines reviewed and adjusted.  If you experience worsening of your admission symptoms, develop shortness of breath, life threatening emergency, suicidal or homicidal thoughts you must seek medical attention immediately by calling 911 or calling your MD immediately  if symptoms less severe.  You Must read complete instructions/literature  along with all the possible adverse reactions/side effects for  all the Medicines you take and that have been prescribed to you. Take any new Medicines after you have completely understood and accpet all the possible adverse reactions/side effects.   Do not drive, operate heavy machinery, perform activities at heights, swimming or participation in water activities or provide baby sitting services if your were admitted for syncope or siezures until you have seen by Primary MD or a Neurologist and advised to do so again.  Do not drive when taking Pain medications.   Procedures/Studies: DG Chest Port 1 View Result Date: 04/06/2024 CLINICAL DATA:  sob EXAM: PORTABLE CHEST 1 VIEW COMPARISON:  CT chest 04/04/2024, chest x-ray 03/14/2024 FINDINGS: The heart and mediastinal contours are unchanged. Atherosclerotic plaque. Interval worsening of right mid to lower lung zone airspace opacity. Chronic coarsened interstitial markings with no overt pulmonary edema. Trace right pleural effusion. No pneumothorax. No acute osseous abnormality. IMPRESSION: 1. Interval worsening of right mid to lower lung zone airspace opacities suggestive of pneumonia. Followup PA and lateral chest X-ray is recommended in 3-4 weeks following therapy to ensure resolution and exclude underlying malignancy. 2. Trace right pleural effusion. 3. Aortic Atherosclerosis (ICD10-I70.0) and Emphysema (ICD10-J43.9). Electronically Signed   By: Morgane  Naveau M.D.   On: 04/06/2024 19:22   CT Chest W Contrast Result Date: 04/06/2024 CLINICAL DATA:  A chronic dyspnea. History of non-small-cell lung cancer. * Tracking Code: BO * EXAM: CT CHEST WITH CONTRAST TECHNIQUE: Multidetector CT imaging of the chest was performed during intravenous contrast administration. RADIATION DOSE REDUCTION: This exam was performed according to the departmental dose-optimization program which includes automated exposure control, adjustment of the mA and/or kV according to  patient size and/or use of iterative reconstruction technique. CONTRAST:  75mL OMNIPAQUE IOHEXOL 300 MG/ML  SOLN COMPARISON:  X-ray 03/14/2024. CTA 02/13/2024. Old PET-CT scan 10/10/2023. FINDINGS: Cardiovascular: Thoracic aorta is normal course and caliber with scattered vascular calcifications. Coronary artery calcifications are seen. Heart is nonenlarged. Trace pericardial fluid. Bovine type aortic arch, normal variant. Mediastinum/Nodes: Preserved thyroid gland. The thoracic esophagus is slightly patulous with some luminal air and debris. No specific abnormal lymph node enlargement identified in the axillary regions. Several abnormal mediastinal nodes are identified including paratracheal and AP window and subcarinal. Example subcarinal node measures 3.3 by 1.8 cm. Previously 3.1 by 1.7 cm, similar. Right paratracheal node measuring 2.4 x 1.1 cm on image 53 is also similar to previous when adjusted for technique. Bilateral hilar nodes are also seen. Example on the right on image 62 of series 2 measures 2.6 x 1.5 cm and previously 2.6 x 1.5 cm. Left hilar nodes are similar. Lungs/Pleura: Extensive bilateral areas of interstitial septal thickening, scarring fibrotic changes. There are areas of traction bronchiectasis. Underlying centrilobular emphysematous changes identified as well. Components of ground-glass are scattered in the left lung but more extensive opacity seen throughout the right lung cleaning tracking along the tracheobronchial tree with distortion of bronchi and bronchiectasis. The confluence and density of this distribution is increasing from the prior CT scan. Also a developing small right pleural effusion and areas of increasing pleural thickening. No pneumothorax. Upper Abdomen: The adrenal glands are incompletely included in the imaging field. The visualized portions are preserved. Small splenules. Musculoskeletal: Scattered degenerative changes along the spine. IMPRESSION: Increasing confluent  interstitial and parenchymal opacities in the right lung with increasing distortion there is also a increasing small right pleural effusion and some increasing pleural thickening. Extensive underlying interstitial fibrotic changes the lungs with honeycombing, traction bronchiectasis and underlying emphysematous lung  changes. Persistent diffuse adenopathy in the mediastinum and hilum, similar to previous. Aortic Atherosclerosis (ICD10-I70.0) and Emphysema (ICD10-J43.9). Electronically Signed   By: Adrianna Horde M.D.   On: 04/06/2024 10:45   DG Chest 2 View Result Date: 03/14/2024 CLINICAL DATA:  Pneumonia. EXAM: CHEST - 2 VIEW COMPARISON:  February 15, 2024. FINDINGS: The heart size and mediastinal contours are within normal limits. There is continued coarse interstitial densities throughout both lungs concerning for atypical pneumonia, which is most prominently seen in the right perihilar and basilar regions. The visualized skeletal structures are unremarkable. IMPRESSION: Continued coarse diffuse interstitial densities are noted bilaterally concerning for atypical pneumonia, most prominently seen in right perihilar and basilar regions. Electronically Signed   By: Rosalene Colon M.D.   On: 03/14/2024 13:37     The results of significant diagnostics from this hospitalization (including imaging, microbiology, ancillary and laboratory) are listed below for reference.     Microbiology: Recent Results (from the past 240 hours)  Culture, blood (routine x 2)     Status: None   Collection Time: 04/06/24  6:05 PM   Specimen: BLOOD  Result Value Ref Range Status   Specimen Description   Final    BLOOD RIGHT ANTECUBITAL Performed at [facility], [address], [place], [place] [address]    Special Requests   Final    BOTTLES DRAWN AEROBIC AND ANAEROBIC Blood Culture adequate volume Performed at [facility], [address], [place], [place] [address]    Culture    Final    NO GROWTH 5 DAYS Performed at [facility], [address], [place], [place] [address]    Report Status 04/11/2024 FINAL  Final  Culture, blood (routine x 2)     Status: None   Collection Time: 04/06/24  6:07 PM   Specimen: BLOOD  Result Value Ref Range Status   Specimen Description   Final    BLOOD BLOOD LEFT WRIST Performed at [facility], [address], [place], [place] [address]    Special Requests   Final    BOTTLES DRAWN AEROBIC AND ANAEROBIC Blood Culture adequate volume Performed at [facility], [address] [place], [place] [address]    Culture   Final    NO GROWTH 5 DAYS Performed at [facility], [address] [place], [place] [address]    Report Status 04/11/2024 FINAL  Final  Resp panel by RT-PCR (RSV, Flu A&B, Covid)     Status: None   Collection Time: 04/06/24  6:07 PM   Specimen: Nasal Swab  Result Value Ref Range Status   SARS Coronavirus 2 by RT PCR NEGATIVE NEGATIVE Final    Comment: (NOTE) SARS-CoV-2 target nucleic acids are NOT DETECTED.  The SARS-CoV-2 RNA is generally detectable in upper respiratory specimens during the acute phase of infection. The lowest concentration of SARS-CoV-2 viral copies this assay can detect is 138 copies/mL. A negative result does not preclude SARS-Cov-2 infection and should not be used as the sole basis for treatment or other patient management decisions. A negative result may occur with  improper specimen collection/handling, submission of specimen other than nasopharyngeal swab, presence of viral mutation(s) within the areas targeted by this assay, and inadequate number of viral copies(<138 copies/mL). A negative result must be combined with clinical observations, patient history, and epidemiological information. The expected result is Negative.  Fact Sheet for Patients:  BloggerCourse.com  Fact Sheet for Healthcare Providers:  SeriousBroker.it  This test is no t yet approved or cleared by the [place]  FDA and  has been authorized for detection and/or diagnosis of SARS-CoV-2 by FDA under an Emergency Use Authorization (EUA). This EUA will remain  in effect (meaning this test can be used) for the duration of the COVID-19 declaration under Section 564(b)(1) of the Act, 21 U.S.C.section 360bbb-3(b)(1), unless the authorization is terminated  or revoked sooner.       Influenza A by PCR NEGATIVE NEGATIVE Final   Influenza B by PCR NEGATIVE NEGATIVE Final    Comment: (NOTE) The Xpert Xpress SARS-CoV-2/FLU/RSV plus assay is intended as an aid in the diagnosis of influenza from Nasopharyngeal swab specimens and should not be used as a sole basis for treatment. Nasal washings and aspirates are unacceptable for Xpert Xpress SARS-CoV-2/FLU/RSV testing.  Fact Sheet for Patients: BloggerCourse.com  Fact Sheet for Healthcare Providers: SeriousBroker.it  This test is not yet approved or cleared by the [place]  FDA and has been authorized for detection and/or diagnosis of SARS-CoV-2 by FDA under an Emergency Use Authorization (EUA). This EUA will remain in effect (meaning this test can be used) for the duration of the COVID-19 declaration under Section 564(b)(1) of the Act, 21 U.S.C. section 360bbb-3(b)(1), unless the authorization is terminated or revoked.     Resp Syncytial Virus by PCR NEGATIVE NEGATIVE Final    Comment: (NOTE) Fact Sheet for Patients: BloggerCourse.com  Fact Sheet for Healthcare Providers: SeriousBroker.it  This test is not yet approved or cleared by the [place]  FDA and has been authorized for detection and/or diagnosis of SARS-CoV-2 by FDA under an Emergency Use Authorization (EUA). This EUA will remain in effect (meaning this test can be used) for  the duration of the COVID-19 declaration under Section 564(b)(1) of the Act, 21 U.S.C. section 360bbb-3(b)(1), unless the authorization is terminated or revoked.  Performed at [facility], [address], [place], [place] [address]   Respiratory (~20 pathogens) panel by PCR     Status: None   Collection Time: 04/06/24  6:07 PM   Specimen: Nasopharyngeal Swab; Respiratory  Result Value Ref Range Status   Adenovirus NOT DETECTED NOT DETECTED Final   Coronavirus 229E NOT DETECTED NOT DETECTED Final    Comment: (NOTE) The Coronavirus on the Respiratory Panel, DOES NOT test for the novel  Coronavirus (2019 nCoV)    Coronavirus HKU1 NOT DETECTED NOT DETECTED Final   Coronavirus NL63 NOT DETECTED NOT DETECTED Final   Coronavirus OC43 NOT DETECTED NOT DETECTED Final   Metapneumovirus NOT DETECTED NOT DETECTED Final   Rhinovirus / Enterovirus NOT DETECTED NOT DETECTED Final   Influenza A NOT DETECTED NOT DETECTED Final   Influenza B NOT DETECTED NOT DETECTED Final   Parainfluenza Virus 1 NOT DETECTED NOT DETECTED Final   Parainfluenza Virus 2 NOT DETECTED NOT DETECTED Final   Parainfluenza Virus 3 NOT DETECTED NOT DETECTED Final   Parainfluenza Virus 4 NOT DETECTED NOT DETECTED Final   Respiratory Syncytial Virus NOT DETECTED NOT DETECTED Final   Bordetella pertussis NOT DETECTED NOT DETECTED Final   Bordetella Parapertussis NOT DETECTED NOT DETECTED Final   Chlamydophila pneumoniae NOT DETECTED NOT DETECTED Final   Mycoplasma pneumoniae NOT DETECTED NOT DETECTED Final    Comment: Performed at [facility], [address] [place], [place] [address]  Aspergillus Ag, BAL/Serum     Status: None   Collection Time: 04/07/24  4:36 AM   Specimen: Vein; Blood  Result Value  Ref Range Status   Aspergillus Ag, BAL/Serum 0.08 0.00 - 0.49 Index Final    Comment: (NOTE) Performed At: [facility] [address] [place], [place] [id number] Jolene Schimke MD  WU:[id number]   MRSA Next Gen by PCR, Nasal     Status: None   Collection Time: 04/07/24  3:32 PM   Specimen: Nasal Mucosa; Nasal Swab  Result Value Ref Range Status   MRSA by PCR Next Gen NOT DETECTED NOT DETECTED Final    Comment: (NOTE) The GeneXpert MRSA Assay (FDA approved for NASAL specimens only), is one component of a comprehensive MRSA colonization surveillance program. It is not intended to diagnose MRSA infection nor to guide or monitor treatment for MRSA infections. Test performance is not FDA approved in patients less than 32 years old. Performed at [facility], [address] [place], [place] [address]   Expectorated Sputum Assessment w Gram Stain, Rflx to Resp Cult     Status: None   Collection Time: 04/08/24  6:58 AM   Specimen: Expectorated Sputum  Result Value Ref Range Status   Specimen Description EXPECTORATED SPUTUM  Final   Special Requests NONE  Final   Sputum evaluation   Final    Sputum specimen not acceptable for testing.  Please recollect.   Karlene Einstein RN @ [phone] 04/08/24 Performed at [facility], [address] [place], [place] [address]    Report Status 04/08/2024 FINAL  Final     Labs: BNP (last 3 results) Recent Labs    04/06/24 1807 04/10/24 0411  BNP 141.6* 90.9   Basic Metabolic Panel: Recent Labs  Lab 04/06/24 2043 04/07/24 0436 04/08/24 0244 04/09/24 0417 04/10/24 0411 04/11/24 0426  NA  --  133* 133* 136 135 132*  K  --  4.5 3.8 3.7 4.1 4.1  CL  --  104 104 106 103 98  CO2  --  20* 22 23 24 25   GLUCOSE  --  235* 142* 157* 164* 317*  BUN  --  16 18 19 17 19   CREATININE  --  0.64 0.54 0.32* 0.53 0.62  CALCIUM  --  8.9 8.8* 9.2 9.1 8.8*  MG 1.0* 2.4  --  1.6* 1.6* 2.0  PHOS  --  3.8  --  3.0  --   --    Liver Function Tests: No results for input(s): "AST", "ALT", "ALKPHOS", "BILITOT", "PROT", "ALBUMIN" in the last 168 hours. No results for input(s): "LIPASE", "AMYLASE" in the last  168 hours. No results for input(s): "AMMONIA" in the last 168 hours. CBC: Recent Labs  Lab 04/06/24 1807 04/07/24 0436 04/08/24 0244 04/09/24 0417 04/10/24 0411 04/11/24 0426  WBC 6.0 3.5* 7.6 6.5 6.2 6.4  NEUTROABS 4.2  --   --   --   --   --   HGB 11.9* 10.9* 10.6* 10.5* 10.9* 11.2*  HCT 38.6 35.0* 35.8* 35.1* 36.3 37.0  MCV 87.5 87.9 90.4 89.5 90.1 90.0  PLT 334 302 323 331 316 341   Cardiac Enzymes: No results for input(s): "CKTOTAL", "CKMB", "CKMBINDEX", "TROPONINI" in the last 168 hours. BNP: Invalid input(s): "POCBNP" CBG: Recent Labs  Lab 04/10/24 0741 04/10/24 1201 04/10/24 1748 04/10/24 2139 04/11/24 0754  GLUCAP 142* 267* 404* 268* 203*   D-Dimer No results for input(s): "DDIMER" in the last 72 hours. Hgb A1c No results for input(s): "HGBA1C" in the last 72 hours. Lipid Profile No results for input(s): "CHOL", "HDL", "LDLCALC", "TRIG", "CHOLHDL", "LDLDIRECT" in the last  72 hours. Thyroid function studies No results for input(s): "TSH", "T4TOTAL", "T3FREE", "THYROIDAB" in the last 72 hours.  Invalid input(s): "FREET3" Anemia work up No results for input(s): "VITAMINB12", "FOLATE", "FERRITIN", "TIBC", "IRON", "RETICCTPCT" in the last 72 hours. Urinalysis No results found for: "COLORURINE", "APPEARANCEUR", "LABSPEC", "PHURINE", "GLUCOSEU", "HGBUR", "BILIRUBINUR", "KETONESUR", "PROTEINUR", "UROBILINOGEN", "NITRITE", "LEUKOCYTESUR" Sepsis Labs Recent Labs  Lab 04/08/24 0244 04/09/24 0417 04/10/24 0411 04/11/24 0426  WBC 7.6 6.5 6.2 6.4   Microbiology Recent Results (from the past 240 hours)  Culture, blood (routine x 2)     Status: None   Collection Time: 04/06/24  6:05 PM   Specimen: BLOOD  Result Value Ref Range Status   Specimen Description   Final    BLOOD RIGHT ANTECUBITAL Performed at [facility], [address], [place], [place] [address]    Special Requests   Final    BOTTLES DRAWN AEROBIC AND ANAEROBIC Blood Culture  adequate volume Performed at [facility], [address], [place], [place] [address]    Culture   Final    NO GROWTH 5 DAYS Performed at [facility], [address], [place], [place] [address]    Report Status 04/11/2024 FINAL  Final  Culture, blood (routine x 2)     Status: None   Collection Time: 04/06/24  6:07 PM   Specimen: BLOOD  Result Value Ref Range Status   Specimen Description   Final    BLOOD BLOOD LEFT WRIST Performed at [facility], [address], [place], [place] [address]    Special Requests   Final    BOTTLES DRAWN AEROBIC AND ANAEROBIC Blood Culture adequate volume Performed at [facility], [address] [place], [place] [address]    Culture   Final    NO GROWTH 5 DAYS Performed at [facility], [address] [place], [place] [address]    Report Status 04/11/2024 FINAL  Final  Resp panel by RT-PCR (RSV, Flu A&B, Covid)     Status: None   Collection Time: 04/06/24  6:07 PM   Specimen: Nasal Swab  Result Value Ref Range Status   SARS Coronavirus 2 by RT PCR NEGATIVE NEGATIVE Final    Comment: (NOTE) SARS-CoV-2 target nucleic acids are NOT DETECTED.  The SARS-CoV-2 RNA is generally detectable in upper respiratory specimens during the acute phase of infection. The lowest concentration of SARS-CoV-2 viral copies this assay can detect is 138 copies/mL. A negative result does not preclude SARS-Cov-2 infection and should not be used as the sole basis for treatment or other patient management decisions. A negative result may occur with  improper specimen collection/handling, submission of specimen other than nasopharyngeal swab, presence of viral mutation(s) within the areas targeted by this assay, and inadequate number of viral copies(<138 copies/mL). A negative result must be combined with clinical observations, patient history, and epidemiological information. The expected result is  Negative.  Fact Sheet for Patients:  BloggerCourse.com  Fact Sheet for Healthcare Providers:  SeriousBroker.it  This test is no t yet approved or cleared by the [place] FDA and  has been authorized for detection and/or diagnosis of SARS-CoV-2 by FDA under an Emergency Use Authorization (EUA). This EUA will remain  in effect (meaning this test can be used) for the duration of the COVID-19 declaration under Section 564(b)(1) of the Act, 21 U.S.C.section 360bbb-3(b)(1), unless the authorization is terminated  or revoked sooner.       Influenza  A by PCR NEGATIVE NEGATIVE Final   Influenza B by PCR NEGATIVE NEGATIVE Final    Comment: (NOTE) The Xpert Xpress SARS-CoV-2/FLU/RSV plus assay is intended as an aid in the diagnosis of influenza from Nasopharyngeal swab specimens and should not be used as a sole basis for treatment. Nasal washings and aspirates are unacceptable for Xpert Xpress SARS-CoV-2/FLU/RSV testing.  Fact Sheet for Patients: BloggerCourse.com  Fact Sheet for Healthcare Providers: SeriousBroker.it  This test is not yet approved or cleared by the [place]  FDA and has been authorized for detection and/or diagnosis of SARS-CoV-2 by FDA under an Emergency Use Authorization (EUA). This EUA will remain in effect (meaning this test can be used) for the duration of the COVID-19 declaration under Section 564(b)(1) of the Act, 21 U.S.C. section 360bbb-3(b)(1), unless the authorization is terminated or revoked.     Resp Syncytial Virus by PCR NEGATIVE NEGATIVE Final    Comment: (NOTE) Fact Sheet for Patients: BloggerCourse.com  Fact Sheet for Healthcare Providers: SeriousBroker.it  This test is not yet approved or cleared by the [place]  FDA and has been authorized for detection and/or diagnosis of  SARS-CoV-2 by FDA under an Emergency Use Authorization (EUA). This EUA will remain in effect (meaning this test can be used) for the duration of the COVID-19 declaration under Section 564(b)(1) of the Act, 21 U.S.C. section 360bbb-3(b)(1), unless the authorization is terminated or revoked.  Performed at [facility], [address] [place], [place] [address]   Respiratory (~20 pathogens) panel by PCR     Status: None   Collection Time: 04/06/24  6:07 PM   Specimen: Nasopharyngeal Swab; Respiratory  Result Value Ref Range Status   Adenovirus NOT DETECTED NOT DETECTED Final   Coronavirus 229E NOT DETECTED NOT DETECTED Final    Comment: (NOTE) The Coronavirus on the Respiratory Panel, DOES NOT test for the novel  Coronavirus (2019 nCoV)    Coronavirus HKU1 NOT DETECTED NOT DETECTED Final   Coronavirus NL63 NOT DETECTED NOT DETECTED Final   Coronavirus OC43 NOT DETECTED NOT DETECTED Final   Metapneumovirus NOT DETECTED NOT DETECTED Final   Rhinovirus / Enterovirus NOT DETECTED NOT DETECTED Final   Influenza A NOT DETECTED NOT DETECTED Final   Influenza B NOT DETECTED NOT DETECTED Final   Parainfluenza Virus 1 NOT DETECTED NOT DETECTED Final   Parainfluenza Virus 2 NOT DETECTED NOT DETECTED Final   Parainfluenza Virus 3 NOT DETECTED NOT DETECTED Final   Parainfluenza Virus 4 NOT DETECTED NOT DETECTED Final   Respiratory Syncytial Virus NOT DETECTED NOT DETECTED Final   Bordetella pertussis NOT DETECTED NOT DETECTED Final   Bordetella Parapertussis NOT DETECTED NOT DETECTED Final   Chlamydophila pneumoniae NOT DETECTED NOT DETECTED Final   Mycoplasma pneumoniae NOT DETECTED NOT DETECTED Final    Comment: Performed at [facility], [address] [place], [place] [address]  Aspergillus Ag, BAL/Serum     Status: None   Collection Time: 04/07/24  4:36 AM   Specimen: Vein; Blood  Result Value Ref Range Status   Aspergillus Ag, BAL/Serum 0.08 0.00 - 0.49 Index  Final    Comment: (NOTE) Performed At: [facility] [address] [place], [place] [id number] Pearlean Botts MD [id number]   MRSA Next Gen by PCR, Nasal     Status: None   Collection Time: 04/07/24  3:32 PM   Specimen: Nasal Mucosa; Nasal Swab  Result Value Ref Range Status   MRSA by PCR Next Gen NOT DETECTED NOT  DETECTED Final    Comment: (NOTE) The GeneXpert MRSA Assay (FDA approved for NASAL specimens only), is one component of a comprehensive MRSA colonization surveillance program. It is not intended to diagnose MRSA infection nor to guide or monitor treatment for MRSA infections. Test performance is not FDA approved in patients less than 40 years old. Performed at [facility], [address] [place], [place] [address]   Expectorated Sputum Assessment w Gram Stain, Rflx to Resp Cult     Status: None   Collection Time: 04/08/24  6:58 AM   Specimen: Expectorated Sputum  Result Value Ref Range Status   Specimen Description EXPECTORATED SPUTUM  Final   Special Requests NONE  Final   Sputum evaluation   Final    Sputum specimen not acceptable for testing.  Please recollect.   Claudene Crystal RN @ [phone] 04/08/24 Performed at [facility], [address], [place], [place] [address]    Report Status 04/08/2024 FINAL  Final     Time coordinating discharge:  I have spent 35 minutes face to face with the patient and on the ward discussing the patients care, assessment, plan and disposition with other care givers. >50% of the time was devoted counseling the patient about the risks and benefits of treatment/Discharge disposition and coordinating care.   SIGNED:   Maggie Schooner, MD  [facility] 04/11/2024, 11:55 AM   If 7PM-7AM, please contact night-coverage

## 2024-04-11 NOTE — TOC Transition Note (Addendum)
 Transition of Care [facility]) - Discharge Note   Patient Details  Name: Lindsey Williamson MRN: [id number] Date of Birth: 07-10-1951  Transition [facility] Care [facility] CM/SW Contact:  Kathryn Parish, RN Phone Number: 04/11/2024, 10:56 AM   Clinical Narrative:    Patient returning home with spouse. Spouse will bring transport oxygen tank for discharge.Will receive HH PT/OT/aid with [facility] and [employer] will provide nebulizer and bring to patients room prior to discharge. Transportation home with spouse via private vehicle. TOC signing off   Final next level of care: Home w Home Health Services Barriers to Discharge: Barriers Resolved   Patient Goals and CMS Choice Patient states their goals for this hospitalization and ongoing recovery are:: Home with [facility]          Discharge Placement                       Discharge Plan and Services Additional resources added to the After Visit Summary for     Discharge Planning Services: CM Consult            DME Arranged: Nebulizer/meds DME Agency: [employer] Date DME Agency Contacted: 04/11/24 Time DME Agency Contacted: 1045 Representative spoke with at DME Agency: April HH Arranged: OT, PT, Nurse's Aide HH Agency: [facility] Date [facility] Agency Contacted: 04/11/24 Time HH Agency Contacted: 1040 Representative spoke with at [facility] Agency: Carmelina Chinchilla  Social Drivers of Health (SDOH) Interventions SDOH Screenings   Food Insecurity: No Food Insecurity (04/08/2024)  Housing: Low Risk  (04/08/2024)  Transportation Needs: No Transportation Needs (04/08/2024)  Utilities: Not At Risk (04/08/2024)  Financial Resource Strain: Low Risk  (01/25/2024)   Received from [facility]  Physical Activity: Insufficiently Active (01/25/2024)   Received from [facility]  Social Connections: Socially Integrated (04/08/2024)  Stress: Stress Concern Present (01/25/2024)   Received from [facility]  Tobacco Use: Medium Risk (04/07/2024)      Readmission Risk Interventions    02/18/2024    9:51 AM  Readmission Risk Prevention Plan  Transportation Screening Complete  PCP or Specialist Appt within 3-5 Days Complete  HRI or Home Care Consult Complete  Social Work Consult for Recovery Care Planning/Counseling Complete  Palliative Care Screening Not Applicable  Medication Review Oceanographer) Complete

## 2024-04-12 ENCOUNTER — Other Ambulatory Visit (HOSPITAL_COMMUNITY): Payer: Self-pay

## 2024-04-12 ENCOUNTER — Telehealth: Payer: Self-pay

## 2024-04-12 NOTE — Telephone Encounter (Signed)
 I called and spoke  pt regarding Mychart message from Dara Ear, NP. Pt states she has an appointment with her pop next week. Please call pt and have her scheduled with our office for May. NFN

## 2024-05-01 ENCOUNTER — Other Ambulatory Visit (HOSPITAL_COMMUNITY): Payer: Self-pay

## 2024-05-01 ENCOUNTER — Encounter: Payer: Self-pay | Admitting: Acute Care

## 2024-05-01 ENCOUNTER — Ambulatory Visit: Admitting: Acute Care

## 2024-05-01 VITALS — BP 156/78 | HR 63 | Ht 64.0 in | Wt 151.4 lb

## 2024-05-01 DIAGNOSIS — Z09 Encounter for follow-up examination after completed treatment for conditions other than malignant neoplasm: Secondary | ICD-10-CM

## 2024-05-01 DIAGNOSIS — C3480 Malignant neoplasm of overlapping sites of unspecified bronchus and lung: Secondary | ICD-10-CM

## 2024-05-01 DIAGNOSIS — J441 Chronic obstructive pulmonary disease with (acute) exacerbation: Secondary | ICD-10-CM

## 2024-05-01 DIAGNOSIS — J4489 Other specified chronic obstructive pulmonary disease: Secondary | ICD-10-CM

## 2024-05-01 DIAGNOSIS — J439 Emphysema, unspecified: Secondary | ICD-10-CM

## 2024-05-01 DIAGNOSIS — J984 Other disorders of lung: Secondary | ICD-10-CM

## 2024-05-01 DIAGNOSIS — Z87891 Personal history of nicotine dependence: Secondary | ICD-10-CM

## 2024-05-01 MED ORDER — UMECLIDINIUM-VILANTEROL 62.5-25 MCG/ACT IN AEPB
1.0000 | INHALATION_SPRAY | Freq: Every day | RESPIRATORY_TRACT | 0 refills | Status: DC
Start: 2024-05-01 — End: 2024-05-14
  Filled 2024-05-01 (×2): qty 60, 60d supply, fill #0
  Filled 2024-05-03: qty 60, 30d supply, fill #0

## 2024-05-01 MED ORDER — PREDNISONE 10 MG PO TABS
10.0000 mg | ORAL_TABLET | Freq: Every day | ORAL | 1 refills | Status: DC
Start: 2024-05-01 — End: 2024-05-14

## 2024-05-01 MED ORDER — ANORO ELLIPTA 62.5-25 MCG/ACT IN AEPB: 1.0000 | INHALATION_SPRAY | Freq: Every day | RESPIRATORY_TRACT | 6 refills | Status: DC

## 2024-05-01 MED ORDER — IPRATROPIUM-ALBUTEROL 0.5-2.5 (3) MG/3ML IN SOLN: 3.0000 mL | Freq: Two times a day (BID) | RESPIRATORY_TRACT | 2 refills | Status: DC

## 2024-05-01 NOTE — Progress Notes (Addendum)
 History of Present Illness Lindsey Williamson is a 73 y.o. female with history of stage Ia non-small cell lung cancer, adenocarcinoma the right upper lobe who under went radiation under the care of Dr. Lorri Rota from 04/23/2022-04/29/22 2 Stage IIIa confirmed (T3, N2, M0) non-small cell lung cancer, squamous cell carcinoma. She presented with recurrent tumor at the right lung base, associated right lower lobe metastasis, and right hilar and subcarinal nodal metastases. This was diagnosed in October 2024.  Cancer Treatment PRIOR THERAPY: 1) Radiation to the right upper lobe under the care of Dr. Lorri Rota from 04/23/2022-04/29/22  2) Concurrent chemoradiation with carboplatin  for an AUC of 2 and paclitaxel  45 mg/m.  Last dose on 12/20/23 Status post 7 cycle.  3) Consolidation immunotherapy with Imfinzi  1500 mg IV every 4 weeks, first dose expected on 02/07/2024. Discontinued after 1 cycle due to concern for pneumonitis  Pred taper  Starting Thu 04/12/2024, Until Wed 04/25/2024 at 2359, THEN  Starting Thu 04/26/2024, Until Wed 05/02/2024 at 2359, THEN  Starting Thu 05/03/2024, Until Wed 05/09/2024 at 2359, THEN  Starting Thu 05/10/2024, Until Fri 06/08/2024 at 2359 Take 4 tablets (40 mg total) by mouth daily with breakfast for 14 days, THEN 3 tablets (30 mg total) daily with breakfast for 7 days, THEN 2 tablets (20 mg total) daily with breakfast for 7 days, THEN 1 tablet (10 mg total) daily with breakfast., Normal  Started on Bactrim  DS three times a week while on prednisone  as prophylaxis.  Hospital admission 04/06/2024-04/11/2024 for slow to resolve pneumonitis, hypoxemia.  Pt. Has consented to use of [employer] soft wear to help capture the content of this OV .   05/01/2024 Pt. Presents for hospital follow up. 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 She was treated with oxygen, antibiotics, steroids and bronchodilators . PCCM was consulted with recommendations for prolonged course of steroids, bronchodilator ,prophylactic course of Bactrim  while on steroids , and oxygen at 2 L [place].  She states she has been feeling much better since admission. She looks much better, She is wearing her oxygen at 3 L [place], which we should be able to wean based on today's oxygen sats of 100% . We have refilled her prednisone  so she can take 10 mg daily after the taper. She will need follow up  in 1 month with either Dr. Gaynell Keeler or Dione Franks ( Both saw her in the hospital) , and decision  needs to be made on how long to continue Bactrim  DS, and prednisone  at 10 mg daily. Today we walked her on the POC to ensure she qualifies. Orders have been sent to her DME. [facility]  Financial assistance paperwork was completed. She has follow up with Dr. Marguerita Shih scheduled.   Test Results: CXR 03/14/2024 Continued coarse diffuse interstitial densities are noted bilaterally concerning for atypical pneumonia, most prominently seen in right perihilar and basilar regions.   Treated with Azithromycin  and prednisone  taper.    CTA 02/13/2024 No evidence of pulmonary embolus. 2. Three-vessel coronary artery disease.  Borderline cardiomegaly. 3. Mediastinal and right hilar adenopathy unchanged. Number for airspace opacity at the right lung base in the right lower lobe unchanged since recent study. 4. Increasing ground-glass opacities throughout the lungs, right greater than left. This could reflect edema or infection.     Latest  Ref Rng & Units 04/11/2024    4:26 AM 04/10/2024    4:11 AM 04/09/2024    4:17 AM  CBC  WBC 4.0 - 10.5 K/uL 6.4  6.2  6.5   Hemoglobin 12.0 - 15.0 g/dL 16.1  09.6  04.5   Hematocrit 36.0 - 46.0 % 37.0  36.3  35.1   Platelets 150 - 400 K/uL 341  316  331         Latest Ref Rng & Units 04/11/2024    4:26 AM 04/10/2024    4:11 AM 04/09/2024    4:17 AM  BMP  Glucose 70 - 99 mg/dL 409  811  914   BUN 8 - 23 mg/dL 19  17  19    Creatinine 0.44 - 1.00 mg/dL 7.82  9.56  2.13   Sodium 135 - 145 mmol/L 132  135  136   Potassium 3.5 - 5.1 mmol/L 4.1  4.1  3.7   Chloride 98 - 111 mmol/L 98  103  106   CO2 22 - 32 mmol/L 25  24  23    Calcium 8.9 - 10.3 mg/dL 8.8  9.1  9.2     BNP    Component Value Date/Time   BNP 90.9 04/10/2024 0411    ProBNP No results found for: "PROBNP"  PFT No results found for: "FEV1PRE", "FEV1POST", "FVCPRE", "FVCPOST", "TLC", "DLCOUNC", "PREFEV1FVCRT", "PSTFEV1FVCRT"  DG Chest Port 1 View Result Date: 04/06/2024 CLINICAL DATA:  sob EXAM: PORTABLE CHEST 1 VIEW COMPARISON:  CT chest 04/04/2024, chest x-ray 03/14/2024 FINDINGS: The heart and mediastinal contours are unchanged. Atherosclerotic plaque. Interval worsening of right mid to lower lung zone airspace opacity. Chronic coarsened interstitial markings with no overt pulmonary edema. Trace right pleural effusion. No pneumothorax. No acute osseous abnormality. IMPRESSION: 1. Interval worsening of right mid to lower lung zone airspace opacities suggestive of pneumonia. Followup PA and lateral chest X-ray is recommended in 3-4 weeks following therapy to ensure resolution and exclude underlying malignancy. 2. Trace right pleural effusion. 3. Aortic Atherosclerosis (ICD10-I70.0) and Emphysema (ICD10-J43.9). Electronically Signed   By: Morgane  Naveau M.D.   On: 04/06/2024 19:22   CT Chest W Contrast Result Date: 04/06/2024 CLINICAL DATA:  A chronic dyspnea. History of non-small-cell lung cancer. * Tracking Code: BO * EXAM: CT CHEST WITH CONTRAST TECHNIQUE: Multidetector CT imaging of the chest was performed during intravenous contrast administration. RADIATION DOSE REDUCTION: This exam was performed according to the departmental dose-optimization program which includes automated exposure  control, adjustment of the mA and/or kV according to patient size and/or use of iterative reconstruction technique. CONTRAST:  75mL OMNIPAQUE  IOHEXOL  300 MG/ML  SOLN COMPARISON:  X-ray 03/14/2024. CTA 02/13/2024. Old PET-CT scan 10/10/2023. FINDINGS: Cardiovascular: Thoracic aorta is normal course and caliber with scattered vascular calcifications. Coronary artery calcifications are seen. Heart is nonenlarged. Trace pericardial fluid. Bovine type aortic arch, normal variant. Mediastinum/Nodes: Preserved thyroid  gland. The thoracic esophagus is slightly patulous with some luminal air and debris. No specific abnormal lymph node enlargement identified in the axillary regions. Several abnormal mediastinal nodes are identified including paratracheal and AP window and subcarinal. Example subcarinal node measures 3.3 by 1.8 cm. Previously 3.1 by 1.7 cm, similar. Right paratracheal node measuring 2.4 x 1.1 cm on image 53 is also similar to previous when adjusted for technique. Bilateral hilar nodes are also seen. Example on the right on image 62 of series 2 measures 2.6 x 1.5 cm and previously 2.6 x 1.5 cm. Left hilar nodes are similar.  Lungs/Pleura: Extensive bilateral areas of interstitial septal thickening, scarring fibrotic changes. There are areas of traction bronchiectasis. Underlying centrilobular emphysematous changes identified as well. Components of ground-glass are scattered in the left lung but more extensive opacity seen throughout the right lung cleaning tracking along the tracheobronchial tree with distortion of bronchi and bronchiectasis. The confluence and density of this distribution is increasing from the prior CT scan. Also a developing small right pleural effusion and areas of increasing pleural thickening. No pneumothorax. Upper Abdomen: The adrenal glands are incompletely included in the imaging field. The visualized portions are preserved. Small splenules. Musculoskeletal: Scattered degenerative  changes along the spine. IMPRESSION: Increasing confluent interstitial and parenchymal opacities in the right lung with increasing distortion there is also a increasing small right pleural effusion and some increasing pleural thickening. Extensive underlying interstitial fibrotic changes the lungs with honeycombing, traction bronchiectasis and underlying emphysematous lung changes. Persistent diffuse adenopathy in the mediastinum and hilum, similar to previous. Aortic Atherosclerosis (ICD10-I70.0) and Emphysema (ICD10-J43.9). Electronically Signed   By: Adrianna Horde M.D.   On: 04/06/2024 10:45     Past medical hx Past Medical History:  Diagnosis Date   Angioedema 10/17/2023   Arthritis    Diabetes mellitus without complication (HCC)    Hypertension      Social History   Tobacco Use   Smoking status: Former    Current packs/day: 0.00    Types: Cigarettes    Quit date: 2024    Years since quitting: 1.3    Passive exposure: Past   Smokeless tobacco: Never  Vaping Use   Vaping status: Never Used  Substance Use Topics   Alcohol use: No   Drug use: Never    Ms.Mckern reports that she quit smoking about 16 months ago. Her smoking use included cigarettes. She has been exposed to tobacco smoke. She has never used smokeless tobacco. She reports that she does not drink alcohol and does not use drugs.  Tobacco Cessation: Former smoker , quit 2024 with a 20 + pack year smoking history   Past surgical hx, Family hx, Social hx all reviewed.  Current Outpatient Medications on File Prior to Visit  Medication Sig   albuterol  (VENTOLIN  HFA) 108 (90 Base) MCG/ACT inhaler Inhale 2 puffs into the lungs every 6 (six) hours as needed for wheezing or shortness of breath.   amLODipine  (NORVASC ) 5 MG tablet Take 2.5 mg by mouth in the morning.   aspirin  EC 81 MG tablet Take 81 mg by mouth daily. Swallow whole.   azelastine  (ASTELIN ) 0.1 % nasal spray Place 2 sprays into both nostrils 2 (two) times  daily as needed for rhinitis.   bisoprolol  (ZEBETA ) 5 MG tablet Take 2.5 mg by mouth daily.   cetirizine  (ZYRTEC  ALLERGY ) 10 MG tablet Take 1 tablet (10 mg total) by mouth daily.   empagliflozin (JARDIANCE) 25 MG TABS tablet Take 25 mg by mouth daily at 6 PM.   ferrous sulfate  325 (65 FE) MG tablet Take 325 mg by mouth daily with breakfast.   fluticasone  (FLONASE ) 50 MCG/ACT nasal spray Place 1 spray into both nostrils daily. (Patient taking differently: Place 1 spray into both nostrils daily as needed for allergies.)   glipiZIDE  (GLUCOTROL  XL) 5 MG 24 hr tablet Take 1 tablet (5 mg total) by mouth daily with breakfast.   guaiFENesin  (MUCINEX ) 600 MG 12 hr tablet Take 1 tablet (600 mg total) by mouth 2 (two) times daily.   guaiFENesin -dextromethorphan (ROBITUSSIN DM) 100-10 MG/5ML syrup Take 5  mLs by mouth every 4 (four) hours as needed for cough.   insulin  aspart (NOVOLOG ) 100 UNIT/ML FlexPen Inject 0-11 Units into the skin 3 (three) times daily with meals. Check Blood Glucose (BG) and inject per scale: BG <150= 0 unit; BG 150-200= 1 unit; BG 201-250= 3 unit; BG 251-300= 5 unit; BG 301-350= 7 unit; BG 351-400= 9 unit; BG >400= 11 unit and Call Primary Care.   ipratropium-albuterol  (DUONEB) 0.5-2.5 (3) MG/3ML SOLN Take 3 mLs by nebulization 2 (two) times daily.   metFORMIN (GLUCOPHAGE-XR) 500 MG 24 hr tablet Take 1,000 mg by mouth in the morning and at bedtime.   nystatin  (MYCOSTATIN /NYSTOP ) powder Apply 1 Application topically 3 (three) times daily.   polyethylene glycol (MIRALAX  / GLYCOLAX ) 17 g packet Take 17 g packet by mouth daily.   pravastatin  (PRAVACHOL ) 80 MG tablet Take 80 mg by mouth at bedtime.   predniSONE  (DELTASONE ) 10 MG tablet Take 4 tablets (40 mg total) by mouth daily with breakfast for 14 days, THEN 3 tablets (30 mg total) daily with breakfast for 7 days, THEN 2 tablets (20 mg total) daily with breakfast for 7 days, THEN 1 tablet (10 mg total) daily with breakfast.    prochlorperazine  (COMPAZINE ) 10 MG tablet TAKE 1 TABLET(10 MG) BY MOUTH EVERY 6 HOURS AS NEEDED   sulfamethoxazole -trimethoprim  (BACTRIM  DS) 800-160 MG tablet Take 1 tablet by mouth 3 (three) times a week.   umeclidinium-vilanterol (ANORO ELLIPTA ) 62.5-25 MCG/ACT AEPB Inhale 1 puff into the lungs daily.   No current facility-administered medications on file prior to visit.     Allergies  Allergen Reactions   Benzonatate Palpitations   Lisinopril Swelling and Other (See Comments)    Patient ended up in the ED with a swollen mouth and tongue   Aspirin  Nausea And Vomiting and Palpitations    Can tolerate baby aspirin  81 mg without difficulty but can't tolerate higher dosages   Azithromycin  Palpitations   Codeine Palpitations    Review Of Systems:  Constitutional:   No  weight loss, night sweats,  Fevers, chills, + fatigue, No   lassitude.  HEENT:   No headaches,  Difficulty swallowing,  Tooth/dental problems, or  Sore throat,                No sneezing, itching, ear ache, nasal congestion, post nasal drip,   CV:  No chest pain,  Orthopnea, PND, swelling in lower extremities, anasarca, dizziness, palpitations, syncope.   GI  No heartburn, indigestion, abdominal pain, nausea, vomiting, diarrhea, change in bowel habits, loss of appetite, bloody stools.   Resp: + shortness of breath with exertion less at rest.  No excess mucus, no productive cough,  No non-productive cough,  No coughing up of blood.  No change in color of mucus.  No wheezing.  No chest wall deformity  Skin: no rash or lesions.  GU: no dysuria, change in color of urine, no urgency or frequency.  No flank pain, no hematuria   MS:  No joint pain or swelling.  No decreased range of motion.  No back pain.  Psych:  No change in mood or affect. No depression or anxiety.  No memory loss.   Vital Signs BP (!) 156/78 (BP Location: Right Arm, Patient Position: Sitting, Cuff Size: Normal)   Pulse 63   Ht 5\' 4"  (1.626 m)   Wt  151 lb 6.4 oz (68.7 kg)   SpO2 100%   BMI 25.99 kg/m    Physical Exam:  General-  No distress,  A&Ox3, pleasant ENT: No sinus tenderness, TM clear, pale nasal mucosa, no oral exudate,no post nasal drip, no LAN Cardiac: S1, S2, regular rate and rhythm, no murmur Chest: No wheeze/ rales/ dullness; no accessory muscle use, no nasal flaring, no sternal retractions, few rhonchi, diminished per bases Abd.: Soft Non-tender, ND, BS +, Body mass index is 25.99 kg/m.  Ext: No clubbing cyanosis, edema, no obvious deformities Neuro:  normal strength, moving all extremities x 4, alert and oriented x 3, Skin: No rashes, warm and dry, no obvious skin lesions Psych: normal mood and behavior   Assessment/Plan Slow to resolve pneumonitis, 2/2   Recent immune therapy related versus radiation therapy pneumonitis Non-small cell lung cancer status post chemoradiation and immunotherapy Acute on chronic hypoxic respiratory requiring hospitalization. Plan Oxygen therapy On 3L oxygen with nasal dryness and occasional epistaxis. Current POC provides pulsed oxygen only, may be insufficient. - Order humidified oxygen from [employer]. - Evaluate for POC use.Order placed  - Continue physical therapy as prescribed.  Use of inhalers and nebulizers Anoro inhaler improves breathing. Discussed cost and availability through [employer]. Provided Anoro samples for cost assessment. - Continue Anoro inhaler once daily with mouth rinse after use. - Provide Anoro samples. Clabe Crete prescription to [facility]. - Continue DuoNeb twice daily.  Prednisone  therapy On long prednisone  taper, planning to continue 10 mg daily post-taper. No adverse reactions reported. - Continue prednisone  taper as prescribed.  Antibiotic therapy Completed doxycycline .  On Bactrim  three times a week as prophylaxis due to prolonged prednisone  use.  Discussed probiotics to prevent gastrointestinal upset. - Continue  Bactrim  three times a week. - Recommend taking a probiotic tablet  like Culturelle  or Activia yogurt to maintain normal gut flora.   I spent 60 minutes dedicated to the care of this patient on the date of this encounter to include pre-visit review of records, face-to-face time with the patient discussing conditions above, post visit ordering of testing, clinical documentation with the electronic health record, making appropriate referrals as documented, and communicating necessary information to the patient's healthcare team.    Raejean Bullock, NP 05/01/2024  9:47 AM

## 2024-05-01 NOTE — Patient Instructions (Addendum)
 It is good to see you today. I am glad you are doing so well.  Continue prednisone  taper as ordered in the hospital Starting Thu 04/26/2024, Until Wed 05/02/2024 at 2359, THEN  Starting Thu 05/03/2024, Until Wed 05/09/2024 at 2359, THEN  Starting Thu 05/10/2024, Until Fri 06/08/2024 at 2359 Take 4 tablets (40 mg total) by mouth daily with breakfast for 14 days, THEN 3 tablets (30 mg total) daily with breakfast for 7 days, THEN 2 tablets (20 mg total) daily with breakfast for 7 days, THEN 1 tablet (10 mg total) daily with breakfast.,  I have ordered refills for after the taper is finished.  Continue Bactrim  3 times a week as you have been doing. Continue Anoro 1 puff once daily. Rinse mouth after use. We will give you come samples today. Continue DuoNebs twice daily as you have been doing. We will walk you today on your Portable oxygen to make sure you are able to qualify before we can order one for you. We will order humidified oxygen from [employer] for you home concentrator. Try oxygen at 2 L [place], as long as your oxygen saturations are > 88%.  Increase back to 3 L if your oxygen level drops, or you feel short of breath.  Follow up with Dr. Dione Franks or Dr. Gaynell Keeler in 1 month. Follow up with Dr. Marguerita Shih as is scheduled.  Call if you need us  sooner  Please contact office for sooner follow up if symptoms do not improve or worsen or seek emergency care

## 2024-05-03 ENCOUNTER — Other Ambulatory Visit (HOSPITAL_COMMUNITY): Payer: Self-pay

## 2024-05-03 ENCOUNTER — Other Ambulatory Visit

## 2024-05-03 ENCOUNTER — Ambulatory Visit

## 2024-05-03 ENCOUNTER — Ambulatory Visit: Admitting: Physician Assistant

## 2024-05-03 ENCOUNTER — Other Ambulatory Visit: Payer: Self-pay

## 2024-05-09 ENCOUNTER — Other Ambulatory Visit: Payer: Self-pay | Admitting: Acute Care

## 2024-05-09 DIAGNOSIS — J441 Chronic obstructive pulmonary disease with (acute) exacerbation: Secondary | ICD-10-CM

## 2024-05-09 MED ORDER — UMECLIDINIUM-VILANTEROL 62.5-25 MCG/ACT IN AEPB: 1.0000 | INHALATION_SPRAY | Freq: Every day | RESPIRATORY_TRACT | 6 refills | Status: DC

## 2024-05-09 NOTE — Telephone Encounter (Signed)
**Note De-identified  [place] Obfuscation** Please advise 

## 2024-05-14 ENCOUNTER — Other Ambulatory Visit: Payer: Self-pay

## 2024-05-14 ENCOUNTER — Emergency Department (HOSPITAL_COMMUNITY)

## 2024-05-14 ENCOUNTER — Emergency Department (HOSPITAL_COMMUNITY): Admission: EM | Admit: 2024-05-14 | Discharge: 2024-05-15 | Disposition: A | Discharge status: Home / Self Care

## 2024-05-14 ENCOUNTER — Encounter (HOSPITAL_COMMUNITY): Payer: Self-pay | Admitting: *Deleted

## 2024-05-14 ENCOUNTER — Other Ambulatory Visit (HOSPITAL_COMMUNITY): Payer: Self-pay

## 2024-05-14 DIAGNOSIS — J984 Other disorders of lung: Secondary | ICD-10-CM

## 2024-05-14 DIAGNOSIS — Z794 Long term (current) use of insulin: Secondary | ICD-10-CM | POA: Diagnosis not present

## 2024-05-14 DIAGNOSIS — J441 Chronic obstructive pulmonary disease with (acute) exacerbation: Secondary | ICD-10-CM

## 2024-05-14 DIAGNOSIS — Z7982 Long term (current) use of aspirin: Secondary | ICD-10-CM | POA: Insufficient documentation

## 2024-05-14 DIAGNOSIS — R042 Hemoptysis: Secondary | ICD-10-CM | POA: Insufficient documentation

## 2024-05-14 LAB — CBC WITH DIFFERENTIAL/PLATELET
Abs Immature Granulocytes: 0.05 10*3/uL (ref 0.00–0.07)
Basophils Absolute: 0 10*3/uL (ref 0.0–0.1)
Basophils Relative: 0 %
Eosinophils Absolute: 0 10*3/uL (ref 0.0–0.5)
Eosinophils Relative: 0 %
HCT: 39.1 % (ref 36.0–46.0)
Hemoglobin: 11.7 g/dL — ABNORMAL LOW (ref 12.0–15.0)
Immature Granulocytes: 1 %
Lymphocytes Relative: 8 %
Lymphs Abs: 0.5 10*3/uL — ABNORMAL LOW (ref 0.7–4.0)
MCH: 26.5 pg (ref 26.0–34.0)
MCHC: 29.9 g/dL — ABNORMAL LOW (ref 30.0–36.0)
MCV: 88.7 fL (ref 80.0–100.0)
Monocytes Absolute: 0.3 10*3/uL (ref 0.1–1.0)
Monocytes Relative: 5 %
Neutro Abs: 5.3 10*3/uL (ref 1.7–7.7)
Neutrophils Relative %: 86 %
Platelets: 334 10*3/uL (ref 150–400)
RBC: 4.41 MIL/uL (ref 3.87–5.11)
RDW: 17.5 % — ABNORMAL HIGH (ref 11.5–15.5)
WBC: 6.1 10*3/uL (ref 4.0–10.5)
nRBC: 0 % (ref 0.0–0.2)

## 2024-05-14 LAB — COMPREHENSIVE METABOLIC PANEL WITH GFR
ALT: 7 U/L (ref 0–44)
AST: 15 U/L (ref 15–41)
Albumin: 3.5 g/dL (ref 3.5–5.0)
Alkaline Phosphatase: 65 U/L (ref 38–126)
Anion gap: 10 (ref 5–15)
BUN: 20 mg/dL (ref 8–23)
CO2: 23 mmol/L (ref 22–32)
Calcium: 8.9 mg/dL (ref 8.9–10.3)
Chloride: 103 mmol/L (ref 98–111)
Creatinine, Ser: 0.74 mg/dL (ref 0.44–1.00)
GFR, Estimated: >60 mL/min (ref >60)
Glucose, Bld: 340 mg/dL — ABNORMAL HIGH (ref 70–99)
Potassium: 4.2 mmol/L (ref 3.5–5.1)
Sodium: 136 mmol/L (ref 135–145)
Total Bilirubin: 0.5 mg/dL (ref 0.0–1.2)
Total Protein: 8 g/dL (ref 6.5–8.1)

## 2024-05-14 MED ORDER — UMECLIDINIUM-VILANTEROL 62.5-25 MCG/ACT IN AEPB: 1.0000 | INHALATION_SPRAY | Freq: Every day | RESPIRATORY_TRACT | 0 refills | Status: DC

## 2024-05-14 MED ORDER — PREDNISONE 10 MG PO TABS
10.0000 mg | ORAL_TABLET | Freq: Every day | ORAL | 0 refills | Status: DC
Start: 2024-05-14 — End: 2024-05-20

## 2024-05-14 MED ORDER — IPRATROPIUM-ALBUTEROL 0.5-2.5 (3) MG/3ML IN SOLN: 3.0000 mL | Freq: Two times a day (BID) | RESPIRATORY_TRACT | 2 refills | Status: DC

## 2024-05-14 MED ORDER — IOHEXOL 350 MG/ML SOLN
75.0000 mL | Freq: Once | INTRAVENOUS | Status: AC | PRN
  Administered 2024-05-14: 75 mL via INTRAVENOUS

## 2024-05-14 NOTE — Progress Notes (Signed)
 90 day supply refills sent.

## 2024-05-14 NOTE — ED Provider Triage Note (Signed)
 Emergency Medicine Provider Triage Evaluation Note  Lindsey Williamson , a 73 y.o. female  was evaluated in triage.  Pt complains of hemoptysis.  Patient with past history significant for lung cancer with concerns of hemoptysis that is developed in the last several days.  States that she is currently treated for pneumonia and has had some coughing ongoing with hemoptysis started more recently.  No prior history of similar symptoms.  No prior history of PE or DVT.  States that she feels very slight worsening trouble breathing but is still on her home baseline O2 via nasal cannula.  Review of Systems  Positive: As above Negative: As above  Physical Exam  BP 127/70 (BP Location: Left Arm)   Pulse 91   Temp 98 F (36.7 C) (Oral)   Resp 20   Wt 68.5 kg   SpO2 99%   BMI 25.92 kg/m  Gen:   Awake, no distress Resp:  Normal effort, slight wheezing heard in all lung fields MSK:   Moves extremities without difficulty  Other:    Medical Decision Making  Medically screening exam initiated at 6:26 PM.  Appropriate orders placed.  LAKETRA BOWDISH was informed that the remainder of the evaluation will be completed by another provider, this initial triage assessment does not replace that evaluation, and the importance of remaining in the ED until their evaluation is complete.     Charle Mclaurin A, PA-C 05/14/24 1827

## 2024-05-14 NOTE — ED Triage Notes (Signed)
 BIB husband for hemoptysis. Onset today. Current tx for PNA, taking prednisone  and ABT. H/o stage 3 lung CA. Describes as thin blood tinged sputum. Denies pain or sob, but had increased dyspnea with exertion. Denies fever, NVD. Reports throat irritation. Alert, NAD, calm, interactive, resps e/u speaking in clear complete sentences, baseline O2 [place].

## 2024-05-15 NOTE — Discharge Instructions (Signed)
 Please follow-up with your primary doctor and your pulmonologist.  Return immediately for fevers, chills, lightheadedness, shortness of breath, cough up more blood or he develop any new or worsening symptoms that are concerning to you.

## 2024-05-15 NOTE — ED Provider Notes (Signed)
[place] EMERGENCY DEPARTMENT AT [facility] Provider Note   CSN: [id number] Arrival date & time: 05/14/24  1724     History  Chief Complaint  Patient presents with   Hemoptysis    Lindsey Williamson is a 73 y.o. female.  73 year old female presenting emergency department for possible hemoptysis.  She notes that she had some blood tinged boogers/mucus last night, awoke today and coughed up several blood-tinged sputum.  She is on baseline oxygen.  Has no history of PE DVT.  Does have lung cancer and reports that she is being treated for pneumonia currently.  Denies chest pain, lightheadedness.        Home Medications Prior to Admission medications   Medication Sig Start Date End Date Taking? Authorizing Provider  albuterol  (VENTOLIN  HFA) 108 (90 Base) MCG/ACT inhaler Inhale 2 puffs into the lungs every 6 (six) hours as needed for wheezing or shortness of breath. 04/11/24   Amin, Ankit C, MD  amLODipine  (NORVASC ) 5 MG tablet Take 2.5 mg by mouth in the morning. 02/08/22   [provider]  aspirin  EC 81 MG tablet Take 81 mg by mouth daily. Swallow whole.    [provider]  azelastine  (ASTELIN ) 0.1 % nasal spray Place 2 sprays into both nostrils 2 (two) times daily as needed for rhinitis. 03/08/24   Orelia Binet, MD  bisoprolol  (ZEBETA ) 5 MG tablet Take 2.5 mg by mouth daily.    [provider]  cetirizine  (ZYRTEC  ALLERGY ) 10 MG tablet Take 1 tablet (10 mg total) by mouth daily. 03/08/24   Orelia Binet, MD  empagliflozin (JARDIANCE) 25 MG TABS tablet Take 25 mg by mouth daily at 6 PM. 02/08/22   [provider]  ferrous sulfate  325 (65 FE) MG tablet Take 325 mg by mouth daily with breakfast.    [provider]  fluticasone  (FLONASE ) 50 MCG/ACT nasal spray Place 1 spray into both nostrils daily. Patient taking differently: Place 1 spray into both nostrils daily as needed for allergies. 03/08/24   Orelia Binet, MD   glipiZIDE  (GLUCOTROL  XL) 5 MG 24 hr tablet Take 1 tablet (5 mg total) by mouth daily with breakfast. 04/11/24   Amin, Ankit C, MD  guaiFENesin  (MUCINEX ) 600 MG 12 hr tablet Take 1 tablet (600 mg total) by mouth 2 (two) times daily. 03/07/24   Heilingoetter, Cassandra L, PA-C  guaiFENesin -dextromethorphan (ROBITUSSIN DM) 100-10 MG/5ML syrup Take 5 mLs by mouth every 4 (four) hours as needed for cough. 03/22/24   Margaretann Sharper, MD  insulin  aspart (NOVOLOG ) 100 UNIT/ML FlexPen Inject 0-11 Units into the skin 3 (three) times daily with meals. Check Blood Glucose (BG) and inject per scale: BG <150= 0 unit; BG 150-200= 1 unit; BG 201-250= 3 unit; BG 251-300= 5 unit; BG 301-350= 7 unit; BG 351-400= 9 unit; BG >400= 11 unit and Call Primary Care. 04/11/24   Amin, Ankit C, MD  ipratropium-albuterol  (DUONEB) 0.5-2.5 (3) MG/3ML SOLN Take 3 mLs by nebulization in the morning and at bedtime. 05/14/24   Raejean Bullock, NP  metFORMIN (GLUCOPHAGE-XR) 500 MG 24 hr tablet Take 1,000 mg by mouth in the morning and at bedtime. 02/08/22   [provider]  nystatin  (MYCOSTATIN /NYSTOP ) powder Apply 1 Application topically 3 (three) times daily. 12/15/23   Heilingoetter, Cassandra L, PA-C  polyethylene glycol (MIRALAX  / GLYCOLAX ) 17 g packet Take 17 g packet by mouth daily. 02/19/24   Adhikari, Amrit, MD  pravastatin  (PRAVACHOL ) 80 MG tablet Take 80 mg  by mouth at bedtime.    [provider]  predniSONE  (DELTASONE ) 10 MG tablet Take 4 tablets (40 mg total) by mouth daily with breakfast for 14 days, THEN 3 tablets (30 mg total) daily with breakfast for 7 days, THEN 2 tablets (20 mg total) daily with breakfast for 7 days, THEN 1 tablet (10 mg total) daily with breakfast. 04/12/24 06/09/24  Rand Burrs C, MD  predniSONE  (DELTASONE ) 10 MG tablet Take 1 tablet (10 mg total) by mouth daily with breakfast. 05/14/24   Raejean Bullock, NP  prochlorperazine  (COMPAZINE ) 10 MG tablet TAKE 1 TABLET(10 MG) BY MOUTH EVERY 6 HOURS  AS NEEDED 11/11/23   Heilingoetter, Cassandra L, PA-C  sulfamethoxazole -trimethoprim  (BACTRIM  DS) 800-160 MG tablet Take 1 tablet by mouth 3 (three) times a week. 04/11/24   Amin, Ankit C, MD  umeclidinium-vilanterol (ANORO ELLIPTA ) 62.5-25 MCG/ACT AEPB Inhale 1 puff into the lungs daily. 05/09/24   Raejean Bullock, NP  umeclidinium-vilanterol (ANORO ELLIPTA ) 62.5-25 MCG/ACT AEPB Inhale 1 puff into the lungs daily. 05/14/24 11/07/2024  Raejean Bullock, NP      Allergies    Benzonatate, Lisinopril, Aspirin , Azithromycin , and Codeine    Review of Systems   Review of Systems  Physical Exam Updated Vital Signs BP (!) 173/91 (BP Location: Left Arm)   Pulse 86   Temp 98.3 F (36.8 C) (Oral)   Resp 18   Wt 68.5 kg   SpO2 99%   BMI 25.92 kg/m  Physical Exam Vitals and nursing note reviewed.  Constitutional:      General: She is not in acute distress.    Appearance: She is not toxic-appearing.  HENT:     Head: Normocephalic.     Nose:     Comments: Some friable tissue in the left nare    Mouth/Throat:     Mouth: Mucous membranes are moist.     Comments: No bleeding down posterior pharynx Eyes:     Conjunctiva/sclera: Conjunctivae normal.     Pupils: Pupils are equal, round, and reactive to light.  Cardiovascular:     Rate and Rhythm: Normal rate and regular rhythm.  Pulmonary:     Effort: Pulmonary effort is normal.     Breath sounds: Normal breath sounds.  Abdominal:     General: Abdomen is flat. There is no distension.     Palpations: Abdomen is soft.     Tenderness: There is no abdominal tenderness. There is no guarding or rebound.  Musculoskeletal:        General: Normal range of motion.  Skin:    General: Skin is warm.     Capillary Refill: Capillary refill takes less than 2 seconds.  Neurological:     Mental Status: She is alert and oriented to person, place, and time.  Psychiatric:        Mood and Affect: Mood normal.        Behavior: Behavior normal.     ED Results  / Procedures / Treatments   Labs (all labs ordered are listed, but only abnormal results are displayed) Labs Reviewed  CBC WITH DIFFERENTIAL/PLATELET - Abnormal; Notable for the following components:      Result Value   Hemoglobin 11.7 (*)    MCHC 29.9 (*)    RDW 17.5 (*)    Lymphs Abs 0.5 (*)    All other components within normal limits  COMPREHENSIVE METABOLIC PANEL WITH GFR - Abnormal; Notable for the following components:   Glucose, Bld 340 (*)  All other components within normal limits    EKG None  Radiology CT Angio Chest PE W and/or Wo Contrast Result Date: 05/14/2024 CLINICAL DATA:  Lung cancer, superimposed pneumonia on prednisone , hemoptysis. Pulmonary embolism. * Tracking Code: BO * EXAM: CT ANGIOGRAPHY CHEST WITH CONTRAST TECHNIQUE: Multidetector CT imaging of the chest was performed using the standard protocol during bolus administration of intravenous contrast. Multiplanar CT image reconstructions and MIPs were obtained to evaluate the vascular anatomy. RADIATION DOSE REDUCTION: This exam was performed according to the departmental dose-optimization program which includes automated exposure control, adjustment of the mA and/or kV according to patient size and/or use of iterative reconstruction technique. CONTRAST:  75mL OMNIPAQUE  IOHEXOL  350 MG/ML SOLN COMPARISON:  04/04/2024, 02/13/2024 FINDINGS: Cardiovascular: There is adequate opacification of the pulmonary arterial tree. Small eccentric mural filling defects within the right lower lobar pulmonary arterial tree is artifactual related to volume averaging. No evidence of acute pulmonary embolism. The central pulmonary arteries are enlarged in keeping with changes of pulmonary arterial hypertension. Extensive multi-vessel coronary artery calcification. Stable cardiomegaly. No pericardial effusion. Mild atherosclerotic calcification within the thoracic aorta. No aortic aneurysm. Mediastinum/Nodes: Confluent right hilar,  subcarinal, and low right paratracheal adenopathy is again identified and appears progressive. This is not optimal delineated, however, due to the early phase of contrast enhancement. Visualized thyroid  is unremarkable. The esophagus is unremarkable. Lungs/Pleura: Fiducial marker again seen posterior basal right lower lobe. Surrounding consolidation, traction bronchiectasis, and architectural distortion with peripheral honeycombing is present in keeping with the sequela of radiation therapy. This area of consolidation appears confluent with the right hilum and previously noted hilar adenopathy. Small but enlarging partially loculated right pleural effusion. Superimposed moderate emphysema. Increasing diffuse background ground-glass opacity throughout the lungs is noted possibly reflecting mild diffuse alveolar pulmonary edema. No pneumothorax. Soft tissue is seen posteriorly within the bronchus intermedius which is new from prior examination and is located in the region of confluent hilar adenopathy. While this may simply represent adherent mucus, direct endobronchial extension of the mass is not excluded. This is best seen on image 65/4. Upper Abdomen: No acute abnormality. Musculoskeletal: Remote appearing L1 superior endplate fracture. No acute bone abnormality. No lytic or blastic bone lesion. Review of the MIP images confirms the above findings. IMPRESSION: 1. No evidence of acute pulmonary embolism. 2. Stable cardiomegaly. 3. Extensive multi-vessel coronary artery calcification. 4. Increasing background ground-glass opacity throughout the lungs bilaterally possibly reflecting mild diffuse alveolar pulmonary edema. 5. New soft tissue within the bronchus intermedius which may simply represent adherent mucus, however, direct endobronchial extension of the mass is not excluded. Short-term follow-up examination with standard contrast enhancement or direct visualization may be helpful for further evaluation. 6.  Progressive right hilar, subcarinal, and low right paratracheal adenopathy. 7. Post radiation changes within the posterior basal right lower lobe. 8. Enlarged central pulmonary arteries in keeping with changes of pulmonary arterial hypertension. Aortic Atherosclerosis (ICD10-I70.0) and Emphysema (ICD10-J43.9). Electronically Signed   By: Worthy Heads M.D.   On: 05/14/2024 23:02   DG Chest Portable 1 View Result Date: 05/14/2024 CLINICAL DATA:  Hemoptysis. EXAM: PORTABLE CHEST 1 VIEW COMPARISON:  Multiple previous imaging studies. The most recent chest x-ray is for levin 2025. The most recent CT scan is 04/05/1999 FINDINGS: The cardiac silhouette, mediastinal and hilar contours are within normal limits and stable. Stable tortuosity and calcification of the thoracic aorta. Stable underlying fibrotic lung disease and persistent diffuse interstitial and airspace process in the right lower lobe, possibly chronic infection/aspiration. No  pneumothorax. IMPRESSION: Stable underlying fibrotic lung disease and persistent diffuse interstitial and airspace process in the right lower lobe, possibly chronic infection/aspiration. Electronically Signed   By: Marrian Siva M.D.   On: 05/14/2024 19:48    Procedures Procedures    Medications Ordered in ED Medications  iohexol  (OMNIPAQUE ) 350 MG/ML injection 75 mL (75 mLs Intravenous Contrast Given 05/14/24 2221)    ED Course/ Medical Decision Making/ A&P Clinical Course as of 05/15/24 0004  Mon May 14, 2024  2316 CT Angio Chest PE W and/or Wo Contrast IMPRESSION: 1. No evidence of acute pulmonary embolism. 2. Stable cardiomegaly. 3. Extensive multi-vessel coronary artery calcification. 4. Increasing background ground-glass opacity throughout the lungs bilaterally possibly reflecting mild diffuse alveolar pulmonary edema. 5. New soft tissue within the bronchus intermedius which may simply represent adherent mucus, however, direct endobronchial extension  of the mass is not excluded. Short-term follow-up examination with standard contrast enhancement or direct visualization may be helpful for further evaluation. 6. Progressive right hilar, subcarinal, and low right paratracheal adenopathy. 7. Post radiation changes within the posterior basal right lower lobe. 8. Enlarged central pulmonary arteries in keeping with changes of pulmonary arterial hypertension.  Aortic Atherosclerosis (ICD10-I70.0) and Emphysema (ICD10-J43.9).   Electronically Signed   By: Worthy Heads M.D.   On: 05/14/2024 23:02   [TY]    Clinical Course User Index [TY] Rolinda Climes, DO                                 Medical Decision Making 74 year old female presenting emergency department for coughing up blood.  She is afebrile nontachycardic hemodynamically stable.  Maintaining oxygen saturation her home 2 L.  On exam she has some friable tissue in her left nare.  It does sound as though she had some blood tinged boogers/mucus last night and coughed up blood this morning which makes me think that blood seemingly from an upper respiratory source.  Labs reassuring.  Stable hemoglobin.  No metabolic derangements.  She is not on a blood thinner.  Chest x-ray reassuring.  Given patient's risk factors concern for possible PE.  CTA obtained.  No PE does have some chronic findings with her underlying lung cancer.  She does not appear to be in distress has been in the emergency department for close to 6 hours.  Again, I believe symptoms are secondary to nose/pharynx rather than true hemoptysis.  Discussed with patient close follow-up with her pulmonologist.  Stable for discharge at this time.  Amount and/or Complexity of Data Reviewed Radiology:  Decision-making details documented in ED Course.  Risk Prescription drug management.         Final Clinical Impression(s) / ED Diagnoses Final diagnoses:  None    Rx / DC Orders ED Discharge Orders     None          Rolinda Climes, DO 05/15/24 0004

## 2024-05-16 NOTE — Telephone Encounter (Signed)
 Patient went to ed later after sending that message and she has an appointment with Dr.Olalere on 6/10

## 2024-05-18 ENCOUNTER — Inpatient Hospital Stay (HOSPITAL_COMMUNITY)
Admission: EM | Admit: 2024-05-18 | Discharge: 2024-05-20 | DRG: 181 | Disposition: A | Discharge status: Home / Self Care | Attending: Internal Medicine | Admitting: Internal Medicine

## 2024-05-18 ENCOUNTER — Emergency Department (HOSPITAL_COMMUNITY)

## 2024-05-18 ENCOUNTER — Other Ambulatory Visit: Payer: Self-pay

## 2024-05-18 ENCOUNTER — Encounter (HOSPITAL_COMMUNITY): Payer: Self-pay

## 2024-05-18 DIAGNOSIS — C771 Secondary and unspecified malignant neoplasm of intrathoracic lymph nodes: Secondary | ICD-10-CM | POA: Diagnosis present

## 2024-05-18 DIAGNOSIS — Z923 Personal history of irradiation: Secondary | ICD-10-CM

## 2024-05-18 DIAGNOSIS — R809 Proteinuria, unspecified: Secondary | ICD-10-CM | POA: Diagnosis present

## 2024-05-18 DIAGNOSIS — J449 Chronic obstructive pulmonary disease, unspecified: Secondary | ICD-10-CM | POA: Diagnosis present

## 2024-05-18 DIAGNOSIS — E876 Hypokalemia: Secondary | ICD-10-CM | POA: Diagnosis present

## 2024-05-18 DIAGNOSIS — Z87891 Personal history of nicotine dependence: Secondary | ICD-10-CM

## 2024-05-18 DIAGNOSIS — Z9981 Dependence on supplemental oxygen: Secondary | ICD-10-CM

## 2024-05-18 DIAGNOSIS — I10 Essential (primary) hypertension: Secondary | ICD-10-CM | POA: Diagnosis present

## 2024-05-18 DIAGNOSIS — D630 Anemia in neoplastic disease: Secondary | ICD-10-CM | POA: Diagnosis present

## 2024-05-18 DIAGNOSIS — J984 Other disorders of lung: Secondary | ICD-10-CM

## 2024-05-18 DIAGNOSIS — Z79899 Other long term (current) drug therapy: Secondary | ICD-10-CM

## 2024-05-18 DIAGNOSIS — Z881 Allergy status to other antibiotic agents status: Secondary | ICD-10-CM

## 2024-05-18 DIAGNOSIS — R042 Hemoptysis: Secondary | ICD-10-CM | POA: Diagnosis not present

## 2024-05-18 DIAGNOSIS — J441 Chronic obstructive pulmonary disease with (acute) exacerbation: Secondary | ICD-10-CM

## 2024-05-18 DIAGNOSIS — Z951 Presence of aortocoronary bypass graft: Secondary | ICD-10-CM

## 2024-05-18 DIAGNOSIS — E785 Hyperlipidemia, unspecified: Secondary | ICD-10-CM | POA: Diagnosis present

## 2024-05-18 DIAGNOSIS — C3431 Malignant neoplasm of lower lobe, right bronchus or lung: Secondary | ICD-10-CM

## 2024-05-18 DIAGNOSIS — Z886 Allergy status to analgesic agent status: Secondary | ICD-10-CM

## 2024-05-18 DIAGNOSIS — Z9221 Personal history of antineoplastic chemotherapy: Secondary | ICD-10-CM

## 2024-05-18 DIAGNOSIS — I2510 Atherosclerotic heart disease of native coronary artery without angina pectoris: Secondary | ICD-10-CM | POA: Diagnosis present

## 2024-05-18 DIAGNOSIS — Z888 Allergy status to other drugs, medicaments and biological substances status: Secondary | ICD-10-CM

## 2024-05-18 DIAGNOSIS — C342 Malignant neoplasm of middle lobe, bronchus or lung: Secondary | ICD-10-CM | POA: Diagnosis not present

## 2024-05-18 DIAGNOSIS — E871 Hypo-osmolality and hyponatremia: Secondary | ICD-10-CM | POA: Diagnosis present

## 2024-05-18 DIAGNOSIS — Z825 Family history of asthma and other chronic lower respiratory diseases: Secondary | ICD-10-CM

## 2024-05-18 DIAGNOSIS — C7801 Secondary malignant neoplasm of right lung: Secondary | ICD-10-CM | POA: Diagnosis not present

## 2024-05-18 DIAGNOSIS — E1129 Type 2 diabetes mellitus with other diabetic kidney complication: Secondary | ICD-10-CM | POA: Diagnosis present

## 2024-05-18 DIAGNOSIS — Z794 Long term (current) use of insulin: Secondary | ICD-10-CM

## 2024-05-18 DIAGNOSIS — Z7951 Long term (current) use of inhaled steroids: Secondary | ICD-10-CM

## 2024-05-18 DIAGNOSIS — E1165 Type 2 diabetes mellitus with hyperglycemia: Secondary | ICD-10-CM | POA: Diagnosis present

## 2024-05-18 DIAGNOSIS — Z7984 Long term (current) use of oral hypoglycemic drugs: Secondary | ICD-10-CM

## 2024-05-18 DIAGNOSIS — Z885 Allergy status to narcotic agent status: Secondary | ICD-10-CM

## 2024-05-18 DIAGNOSIS — J9611 Chronic respiratory failure with hypoxia: Secondary | ICD-10-CM | POA: Diagnosis present

## 2024-05-18 DIAGNOSIS — Z7982 Long term (current) use of aspirin: Secondary | ICD-10-CM

## 2024-05-18 DIAGNOSIS — J849 Interstitial pulmonary disease, unspecified: Secondary | ICD-10-CM | POA: Diagnosis present

## 2024-05-18 LAB — BASIC METABOLIC PANEL WITH GFR
Anion gap: 10 (ref 5–15)
BUN: 30 mg/dL — ABNORMAL HIGH (ref 8–23)
CO2: 24 mmol/L (ref 22–32)
Calcium: 9.4 mg/dL (ref 8.9–10.3)
Chloride: 100 mmol/L (ref 98–111)
Creatinine, Ser: 0.57 mg/dL (ref 0.44–1.00)
GFR, Estimated: >60 mL/min (ref >60)
Glucose, Bld: 130 mg/dL — ABNORMAL HIGH (ref 70–99)
Potassium: 3.4 mmol/L — ABNORMAL LOW (ref 3.5–5.1)
Sodium: 134 mmol/L — ABNORMAL LOW (ref 135–145)

## 2024-05-18 LAB — CBC WITH DIFFERENTIAL/PLATELET
Abs Immature Granulocytes: 0.03 10*3/uL (ref 0.00–0.07)
Basophils Absolute: 0 10*3/uL (ref 0.0–0.1)
Basophils Relative: 1 %
Eosinophils Absolute: 0.1 10*3/uL (ref 0.0–0.5)
Eosinophils Relative: 1 %
HCT: 34.3 % — ABNORMAL LOW (ref 36.0–46.0)
Hemoglobin: 10.6 g/dL — ABNORMAL LOW (ref 12.0–15.0)
Immature Granulocytes: 1 %
Lymphocytes Relative: 13 %
Lymphs Abs: 0.8 10*3/uL (ref 0.7–4.0)
MCH: 27.5 pg (ref 26.0–34.0)
MCHC: 30.9 g/dL (ref 30.0–36.0)
MCV: 89.1 fL (ref 80.0–100.0)
Monocytes Absolute: 0.8 10*3/uL (ref 0.1–1.0)
Monocytes Relative: 12 %
Neutro Abs: 4.6 10*3/uL (ref 1.7–7.7)
Neutrophils Relative %: 72 %
Platelets: 273 10*3/uL (ref 150–400)
RBC: 3.85 MIL/uL — ABNORMAL LOW (ref 3.87–5.11)
RDW: 17.2 % — ABNORMAL HIGH (ref 11.5–15.5)
WBC: 6.4 10*3/uL (ref 4.0–10.5)
nRBC: 0 % (ref 0.0–0.2)

## 2024-05-18 LAB — PROTIME-INR
INR: 0.9 (ref 0.8–1.2)
Prothrombin Time: 12.6 s (ref 11.4–15.2)

## 2024-05-18 LAB — GLUCOSE, CAPILLARY
Glucose-Capillary: 224 mg/dL — ABNORMAL HIGH (ref 70–99)
Glucose-Capillary: 210 mg/dL — ABNORMAL HIGH (ref 70–99)
Glucose-Capillary: 226 mg/dL — ABNORMAL HIGH (ref 70–99)

## 2024-05-18 MED ORDER — INSULIN ASPART 100 UNIT/ML IJ SOLN
0.0000 [IU] | Freq: Three times a day (TID) | INTRAMUSCULAR | Status: DC
  Administered 2024-05-18: 5 [IU] via SUBCUTANEOUS
  Administered 2024-05-19: 2 [IU] via SUBCUTANEOUS
  Administered 2024-05-19: 15 [IU] via SUBCUTANEOUS
  Administered 2024-05-20: 3 [IU] via SUBCUTANEOUS

## 2024-05-18 MED ORDER — AZELASTINE HCL 0.1 % NA SOLN: 2.0000 | Freq: Two times a day (BID) | NASAL | Status: DC | PRN

## 2024-05-18 MED ORDER — INSULIN ASPART 100 UNIT/ML IJ SOLN
0.0000 [IU] | Freq: Every day | INTRAMUSCULAR | Status: DC
  Administered 2024-05-18 – 2024-05-19 (×2): 2 [IU] via SUBCUTANEOUS

## 2024-05-18 MED ORDER — POTASSIUM CHLORIDE 20 MEQ PO PACK
40.0000 meq | PACK | Freq: Every day | ORAL | Status: DC
  Administered 2024-05-18: 40 meq via ORAL
  Filled 2024-05-18: qty 2

## 2024-05-18 MED ORDER — SULFAMETHOXAZOLE-TRIMETHOPRIM 800-160 MG PO TABS
1.0000 | ORAL_TABLET | ORAL | Status: DC
  Administered 2024-05-18: 1 via ORAL
  Filled 2024-05-18: qty 1

## 2024-05-18 MED ORDER — PREDNISONE 5 MG PO TABS
10.0000 mg | ORAL_TABLET | Freq: Every day | ORAL | Status: DC
  Administered 2024-05-19 – 2024-05-20 (×2): 10 mg via ORAL
  Filled 2024-05-18 (×2): qty 2

## 2024-05-18 MED ORDER — UMECLIDINIUM-VILANTEROL 62.5-25 MCG/ACT IN AEPB
1.0000 | INHALATION_SPRAY | Freq: Every day | RESPIRATORY_TRACT | Status: DC
  Administered 2024-05-18 – 2024-05-20 (×3): 1 via RESPIRATORY_TRACT
  Filled 2024-05-18: qty 14

## 2024-05-18 MED ORDER — AMLODIPINE BESYLATE 5 MG PO TABS
2.5000 mg | ORAL_TABLET | Freq: Every morning | ORAL | Status: DC
  Administered 2024-05-19 – 2024-05-20 (×2): 2.5 mg via ORAL
  Filled 2024-05-18 (×2): qty 1

## 2024-05-18 MED ORDER — POLYETHYLENE GLYCOL 3350 17 G PO PACK
17.0000 g | PACK | Freq: Every day | ORAL | Status: DC
  Administered 2024-05-18 – 2024-05-20 (×2): 17 g via ORAL
  Filled 2024-05-18 (×3): qty 1

## 2024-05-18 MED ORDER — PRAVASTATIN SODIUM 40 MG PO TABS
80.0000 mg | ORAL_TABLET | Freq: Every day | ORAL | Status: DC
Start: 2024-05-18 — End: 2024-05-20
  Administered 2024-05-18 – 2024-05-19 (×2): 80 mg via ORAL
  Filled 2024-05-18 (×2): qty 2

## 2024-05-18 MED ORDER — IPRATROPIUM-ALBUTEROL 0.5-2.5 (3) MG/3ML IN SOLN
3.0000 mL | RESPIRATORY_TRACT | Status: DC | PRN
Start: 2024-05-18 — End: 2024-05-20

## 2024-05-18 MED ORDER — OXYCODONE HCL 5 MG PO TABS: 5.0000 mg | ORAL_TABLET | ORAL | Status: DC | PRN

## 2024-05-18 MED ORDER — FERROUS SULFATE 325 (65 FE) MG PO TABS
325.0000 mg | ORAL_TABLET | Freq: Every day | ORAL | Status: DC
  Administered 2024-05-20: 325 mg via ORAL
  Filled 2024-05-18 (×2): qty 1

## 2024-05-18 MED ORDER — ACETAMINOPHEN 325 MG PO TABS
650.0000 mg | ORAL_TABLET | Freq: Four times a day (QID) | ORAL | Status: DC | PRN
  Administered 2024-05-19 (×2): 650 mg via ORAL
  Filled 2024-05-18 (×2): qty 2

## 2024-05-18 MED ORDER — PANTOPRAZOLE SODIUM 40 MG PO TBEC
40.0000 mg | DELAYED_RELEASE_TABLET | Freq: Two times a day (BID) | ORAL | Status: DC
  Administered 2024-05-18 (×2): 40 mg via ORAL
  Filled 2024-05-18 (×2): qty 1

## 2024-05-18 MED ORDER — TRANEXAMIC ACID FOR INHALATION
500.0000 mg | Freq: Once | RESPIRATORY_TRACT | Status: AC
  Administered 2024-05-18: 500 mg via RESPIRATORY_TRACT
  Filled 2024-05-18: qty 5

## 2024-05-18 MED ORDER — ACETAMINOPHEN 650 MG RE SUPP: 650.0000 mg | Freq: Four times a day (QID) | RECTAL | Status: DC | PRN

## 2024-05-18 MED ORDER — BISOPROLOL FUMARATE 5 MG PO TABS
2.5000 mg | ORAL_TABLET | Freq: Every day | ORAL | Status: DC
  Administered 2024-05-19 – 2024-05-20 (×2): 2.5 mg via ORAL
  Filled 2024-05-18 (×2): qty 1

## 2024-05-18 MED ORDER — LORATADINE 10 MG PO TABS
10.0000 mg | ORAL_TABLET | Freq: Every day | ORAL | Status: DC
  Administered 2024-05-19 – 2024-05-20 (×2): 10 mg via ORAL
  Filled 2024-05-18 (×2): qty 1

## 2024-05-18 MED ORDER — ONDANSETRON HCL 4 MG/2ML IJ SOLN
4.0000 mg | Freq: Four times a day (QID) | INTRAMUSCULAR | Status: DC | PRN
Start: 2024-05-18 — End: 2024-05-20

## 2024-05-18 MED ORDER — TRANEXAMIC ACID FOR INHALATION: 500.0000 mg | Freq: Three times a day (TID) | RESPIRATORY_TRACT | Status: DC | PRN

## 2024-05-18 MED ORDER — ONDANSETRON HCL 4 MG PO TABS: 4.0000 mg | ORAL_TABLET | Freq: Four times a day (QID) | ORAL | Status: DC | PRN

## 2024-05-18 MED ORDER — FLUTICASONE PROPIONATE 50 MCG/ACT NA SUSP: 1.0000 | Freq: Every day | NASAL | Status: DC | PRN

## 2024-05-18 MED ORDER — FENTANYL CITRATE (PF) 100 MCG/2ML IJ SOLN
INTRAMUSCULAR | Status: AC
  Filled 2024-05-18: qty 2

## 2024-05-18 NOTE — H&P (Signed)
History and Physical    Patient: Lindsey Williamson MVH:[id number] DOB: Jul 18, 1951 DOA: 05/18/2024 DOS: the patient was seen and examined on 05/18/2024 PCP: Claudell Cruz, MD  Patient coming from: Home  Chief Complaint:  Chief Complaint  Patient presents with   Hemoptysis       HPI:  73 y.o. 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  Admitted earlier this month with COPD flare, discharged on prolonged prednisone  taper.  Returned to the ER a few days ago with blood-tinged sputum, CTA no change, appeared benign, discharged home.  Was better until this morning, woke early morning hours coughing up more blood, "significantly more" this time.  In the ER, SpO2 at baseline.  No further bleeding.  CXR clear.    Pulm saw the patient and recommended observation overnight, TXA nebs.      Review of Systems  Constitutional:  Negative for chills, fever and weight loss.  Respiratory:  Positive for hemoptysis. Negative for cough, sputum production, shortness of breath and wheezing.   Cardiovascular:  Negative for chest pain.  Gastrointestinal:  Positive for heartburn.  All other systems reviewed and are negative.    Past Medical History:  Diagnosis Date   Angioedema 10/17/2023   Arthritis    Diabetes mellitus without complication (HCC)    Hypertension    Past Surgical History:  Procedure Laterality Date   BRONCHIAL BIOPSY  03/30/2022   Procedure: BRONCHIAL BIOPSIES;  Surgeon: Prudy Brownie, DO;  Location: [facility] ENDOSCOPY;  Service: Pulmonary;;   BRONCHIAL BIOPSY  10/17/2023   Procedure: BRONCHIAL BIOPSIES;  Surgeon: Denson Flake, MD;  Location: [facility] ENDOSCOPY;  Service: Pulmonary;;   BRONCHIAL BRUSHINGS  03/30/2022   Procedure: BRONCHIAL BRUSHINGS;  Surgeon: Prudy Brownie, DO;  Location: [facility] ENDOSCOPY;  Service: Pulmonary;;   BRONCHIAL BRUSHINGS  10/17/2023   Procedure:  BRONCHIAL BRUSHINGS;  Surgeon: Denson Flake, MD;  Location: [facility] ENDOSCOPY;  Service: Pulmonary;;   BRONCHIAL NEEDLE ASPIRATION BIOPSY  03/30/2022   Procedure: BRONCHIAL NEEDLE ASPIRATION BIOPSIES;  Surgeon: Prudy Brownie, DO;  Location: [facility] ENDOSCOPY;  Service: Pulmonary;;   BRONCHIAL NEEDLE ASPIRATION BIOPSY  10/17/2023   Procedure: BRONCHIAL NEEDLE ASPIRATION BIOPSIES;  Surgeon: Denson Flake, MD;  Location: [facility] ENDOSCOPY;  Service: Pulmonary;;   EYE SURGERY     FIDUCIAL MARKER PLACEMENT  03/30/2022   Procedure: FIDUCIAL MARKER PLACEMENT;  Surgeon: Prudy Brownie, DO;  Location: [facility] ENDOSCOPY;  Service: Pulmonary;;   FINE NEEDLE ASPIRATION  10/17/2023   Procedure: FINE NEEDLE ASPIRATION;  Surgeon: Denson Flake, MD;  Location: [facility] ENDOSCOPY;  Service: Pulmonary;;   FOREIGN BODY REMOVAL  03/30/2022   Procedure: FOREIGN BODY REMOVAL;  Surgeon: Prudy Brownie, DO;  Location: [facility] ENDOSCOPY;  Service: Pulmonary;;   HEMOSTASIS CONTROL  10/17/2023   Procedure: HEMOSTASIS CONTROL;  Surgeon: Denson Flake, MD;  Location: [facility] ENDOSCOPY;  Service: Pulmonary;;   VIDEO BRONCHOSCOPY WITH ENDOBRONCHIAL ULTRASOUND Bilateral 03/30/2022   Procedure: VIDEO BRONCHOSCOPY WITH ENDOBRONCHIAL ULTRASOUND;  Surgeon: Prudy Brownie, DO;  Location: [facility] ENDOSCOPY;  Service: Pulmonary;  Laterality: Bilateral;   VIDEO BRONCHOSCOPY WITH ENDOBRONCHIAL ULTRASOUND Right 10/17/2023   Procedure: VIDEO BRONCHOSCOPY WITH ENDOBRONCHIAL ULTRASOUND;  Surgeon: Denson Flake, MD;  Location: [facility] ENDOSCOPY;  Service: Pulmonary;  Laterality: Right;   VIDEO BRONCHOSCOPY WITH RADIAL ENDOBRONCHIAL ULTRASOUND  03/30/2022   Procedure: VIDEO BRONCHOSCOPY WITH  RADIAL ENDOBRONCHIAL ULTRASOUND;  Surgeon: Prudy Brownie, DO;  Location: [facility] ENDOSCOPY;  Service: Pulmonary;;   Social History:  reports that she quit smoking about 16 months ago. Her smoking use included cigarettes. She has been exposed to tobacco smoke. She has never used smokeless  tobacco. She reports that she does not drink alcohol and does not use drugs.  Allergies  Allergen Reactions   Benzonatate Palpitations   Lisinopril Swelling and Other (See Comments)    Patient ended up in the ED with a swollen mouth and tongue   Aspirin  Nausea And Vomiting and Palpitations    Can tolerate baby aspirin  81 mg without difficulty but can't tolerate higher dosages   Azithromycin  Palpitations   Codeine Palpitations    Family History  Problem Relation Age of Onset   Allergic rhinitis Sister    Asthma Sister    Angioedema Neg Hx    Atopy Neg Hx    Immunodeficiency Neg Hx    Urticaria Neg Hx    Eczema Neg Hx     Prior to Admission medications   Medication Sig Start Date End Date Taking? Authorizing Provider  albuterol  (VENTOLIN  HFA) 108 (90 Base) MCG/ACT inhaler Inhale 2 puffs into the lungs every 6 (six) hours as needed for wheezing or shortness of breath. 04/11/24   Amin, Ankit C, MD  amLODipine  (NORVASC ) 5 MG tablet Take 2.5 mg by mouth in the morning. 02/08/22   [provider]  aspirin  EC 81 MG tablet Take 81 mg by mouth daily. Swallow whole.    [provider]  azelastine  (ASTELIN ) 0.1 % nasal spray Place 2 sprays into both nostrils 2 (two) times daily as needed for rhinitis. 03/08/24   Orelia Binet, MD  bisoprolol  (ZEBETA ) 5 MG tablet Take 2.5 mg by mouth daily.    [provider]  cetirizine  (ZYRTEC  ALLERGY ) 10 MG tablet Take 1 tablet (10 mg total) by mouth daily. 03/08/24   Orelia Binet, MD  diclofenac Sodium (VOLTAREN) 1 % GEL Apply topically.    [provider]  empagliflozin (JARDIANCE) 25 MG TABS tablet Take 25 mg by mouth daily at 6 PM. 02/08/22   [provider]  ferrous sulfate  325 (65 FE) MG tablet Take 325 mg by mouth daily with breakfast.    [provider]  fluticasone  (FLONASE ) 50 MCG/ACT nasal spray Place 1 spray into both nostrils daily. Patient taking differently: Place 1 spray into both  nostrils daily as needed for allergies. 03/08/24   Orelia Binet, MD  glipiZIDE  (GLUCOTROL  XL) 5 MG 24 hr tablet Take 1 tablet (5 mg total) by mouth daily with breakfast. 04/11/24   Amin, Ankit C, MD  guaiFENesin  (MUCINEX ) 600 MG 12 hr tablet Take 1 tablet (600 mg total) by mouth 2 (two) times daily. 03/07/24   Heilingoetter, Cassandra L, PA-C  guaiFENesin -dextromethorphan (ROBITUSSIN DM) 100-10 MG/5ML syrup Take 5 mLs by mouth every 4 (four) hours as needed for cough. 03/22/24   Margaretann Sharper, MD  insulin  aspart (NOVOLOG ) 100 UNIT/ML FlexPen Inject 0-11 Units into the skin 3 (three) times daily with meals. Check Blood Glucose (BG) and inject per scale: BG <150= 0 unit; BG 150-200= 1 unit; BG 201-250= 3 unit; BG 251-300= 5 unit; BG 301-350= 7 unit; BG 351-400= 9 unit; BG >400= 11 unit and Call Primary Care. 04/11/24   Amin, Ankit C, MD  ipratropium-albuterol  (DUONEB) 0.5-2.5 (3) MG/3ML SOLN Take 3 mLs by nebulization in the morning and at bedtime. 05/14/24   Groce,  Valery Gaucher, NP  metFORMIN (GLUCOPHAGE-XR) 500 MG 24 hr tablet Take 1,000 mg by mouth in the morning and at bedtime. 02/08/22   [provider]  nystatin  (MYCOSTATIN /NYSTOP ) powder Apply 1 Application topically 3 (three) times daily. 12/15/23   Heilingoetter, Cassandra L, PA-C  polyethylene glycol (MIRALAX  / GLYCOLAX ) 17 g packet Take 17 g packet by mouth daily. 02/19/24   Leona Rake, MD  pravastatin  (PRAVACHOL ) 80 MG tablet Take 80 mg by mouth at bedtime.    [provider]  predniSONE  (DELTASONE ) 10 MG tablet Take 4 tablets (40 mg total) by mouth daily with breakfast for 14 days, THEN 3 tablets (30 mg total) daily with breakfast for 7 days, THEN 2 tablets (20 mg total) daily with breakfast for 7 days, THEN 1 tablet (10 mg total) daily with breakfast. 04/12/24 06/09/24  Amin, Ankit C, MD  predniSONE  (DELTASONE ) 10 MG tablet Take 1 tablet (10 mg total) by mouth daily with breakfast. 05/14/24   Raejean Bullock, NP   prochlorperazine  (COMPAZINE ) 10 MG tablet TAKE 1 TABLET(10 MG) BY MOUTH EVERY 6 HOURS AS NEEDED 11/11/23   Heilingoetter, Cassandra L, PA-C  sulfamethoxazole -trimethoprim  (BACTRIM  DS) 800-160 MG tablet Take 1 tablet by mouth 3 (three) times a week. 04/11/24   Amin, Ankit C, MD  umeclidinium-vilanterol (ANORO ELLIPTA ) 62.5-25 MCG/ACT AEPB Inhale 1 puff into the lungs daily. 05/09/24   Raejean Bullock, NP  umeclidinium-vilanterol (ANORO ELLIPTA ) 62.5-25 MCG/ACT AEPB Inhale 1 puff into the lungs daily. 05/14/24 11/13/2024  Raejean Bullock, NP    Physical Exam: Vitals:   05/18/24 0830 05/18/24 1033 05/18/24 1043 05/18/24 1246  BP: (!) 168/83   132/75  Pulse: 80   87  Resp: 16   (!) 23  Temp:   97.7 F (36.5 C) 98.1 F (36.7 C)  TempSrc:   Oral Oral  SpO2: 100% 96%  98%  Weight:    67.9 kg  Height:    5\' 4"  (1.626 m)   General appearance: Alert adult female, sitting up in bed, interactive and appropriate, no acute distress HEENT: Anicteric, conjunctival pink, lids and lashes normal.  No nasal vomiting, discharge, or epistaxis, visual tracking smooth, oropharynx moist without lesions, partially edentulous, lips normal, no oral lesions Cor:  RRR, without murmurs, rubs.  JVP normal, no LE edema Resp: Lung sounds diminished overall but no rales or wheezes appreciated, good air movement, somewhat diminished on the right. Abd:  No TTP or rebound all quadrants.  No masses or organomegaly.   No ascites, distension.   MSK: Symmetrical without gross deformities of the hands, large joints, or legs. Skin:  cap refill normal, Skin intact without significant rashes or lesions. Neuro:  Speech is fluent.  Naming is grossly intact, patient's recall, both recent and remote, seem within normal limits.  Muscle tone normal, face symmetric  Psych:  Attention span and concentration are within normal limits.  Affect normal.  appropriate thought content and normal rate of speech, thought process linear           Data  Reviewed: Discussed with pulmonology Basic metabolic panel shows mild hyponatremia, mild hypokalemia, elevated BUN/creatinine ratio, creatinine normal CBC shows mild anemia, stable from baseline INR 0.9 Chest x-ray clear, no airspace disease or opacity, personally reviewed    Assessment and Plan: * Hemoptysis Hemoptysis at home "blood covering my tongue", none since.  Hgb stable here from baseline.  On EBUS last Oct "evolving endobronchial lesion in the mid to distal bronchus intermedius extending down  to the right lower lobe orifice where there was a more distinct raised endobronchial lesion."  Likely site of bleeding.  Pulm were consulted and offered to confirm this with repeat bronchoscopy today, so that if massive hemoptysis were to occur in the future, there would be a clearly known target for embolization. - Observe overnight - TXA neb can be repeated if repeat hemoptysis - Bronchoscopy tonight - If stable overnight, can d/c tomorrow    Malignant neoplasm of lower lobe of right lung (HCC)    COPD (chronic obstructive pulmonary disease) (HCC) Chronic respiratory failure on 2L at home No wheezing, no active flare - Continue as needed albuterol , Astelin , Zyrtec , Flonase , Mucinex  - DuoNeb as needed - Continue daily prednisone  10 mg - Continue LABA/LAMA  ILD (interstitial lung disease) (HCC)    Type 2 diabetes mellitus with microalbuminuria (HCC) GLucose normal - Hold glipizide  and Jardiance, metformin and home Novolog  - Start SS correction insulin   Hypertension BP normal - Continue home amlodipine  bisoprolol   Hypokalemia - Supplement K  Hyponatremia Mild, asymptomatic, continue home medicines       Advance Care Planning: Patient wants no chest compressions, no CPR; would wish for intubation or any other pre-arrest interventions  Consults: Pulmonology, Dr. [facility], case discussed with PA, Dr. Marguerita Shih out of offiec today  Family Communication: Husband  by phone  Severity of Illness: The appropriate patient status for this patient is OBSERVATION. Observation status is judged to be reasonable and necessary in order to provide the required intensity of service to ensure the patient's safety. The patient's presenting symptoms, physical exam findings, and initial radiographic and laboratory data in the context of their medical condition is felt to place them at decreased risk for further clinical deterioration. Furthermore, it is anticipated that the patient will be medically stable for discharge from the hospital within 2 midnights of admission.   Author: Ephriam Hashimoto, MD 05/18/2024 12:56 PM  For on call review [email or web address].

## 2024-05-18 NOTE — Care Management Obs Status (Signed)
 MEDICARE OBSERVATION STATUS NOTIFICATION   Patient Details  Name: Lindsey Williamson MRN: [id number] Date of Birth: 29-Jun-1951   Medicare Observation Status Notification Given:  Yes    Ruben Corolla, RN 05/18/2024, 4:02 PM

## 2024-05-18 NOTE — ED Triage Notes (Signed)
 Recurrent episodes of hemoptysis.   Says she was coughing this morning into a tissue and noticed much more bleed than last time.   Hx of lung cancer on immunotherapy.   Baseline 2L [place] for COPD. Says she has it turned up to 3L as she forgets to turn it back down.

## 2024-05-18 NOTE — Anesthesia Preprocedure Evaluation (Addendum)
 Anesthesia Evaluation  Patient identified by MRN, date of birth, ID band Patient awake    Reviewed: Allergy  & Precautions, H&P , NPO status , Patient's Chart, lab work & pertinent test results  Airway Mallampati: II  TM Distance: >3 FB Neck ROM: Full    Dental no notable dental hx. (+) Poor Dentition, Dental Advisory Given   Pulmonary COPD, Patient abstained from smoking., former smoker   Pulmonary exam normal breath sounds clear to auscultation       Cardiovascular hypertension, Pt. on medications  Rhythm:Regular Rate:Normal     Neuro/Psych  Headaches PSYCHIATRIC DISORDERS  Depression     Neuromuscular disease    GI/Hepatic negative GI ROS, Neg liver ROS,,,  Endo/Other  diabetes, Type 2, Oral Hypoglycemic Agents    Renal/GU negative Renal ROS  negative genitourinary   Musculoskeletal  (+) Arthritis , Osteoarthritis,    Abdominal   Peds  Hematology negative hematology ROS (+)   Anesthesia Other Findings   Reproductive/Obstetrics negative OB ROS                             Anesthesia Physical Anesthesia Plan  ASA: 3  Anesthesia Plan: General   Post-op Pain Management: Minimal or no pain anticipated   Induction: Intravenous  PONV Risk Score and Plan: 3 and Ondansetron  and Dexamethasone   Airway Management Planned: Oral ETT  Additional Equipment: None  Intra-op Plan:   Post-operative Plan: Extubation in OR  Informed Consent: I have reviewed the patients History and Physical, chart, labs and discussed the procedure including the risks, benefits and alternatives for the proposed anesthesia with the patient or authorized representative who has indicated his/her understanding and acceptance.     Dental advisory given  Plan Discussed with: CRNA and Anesthesiologist  Anesthesia Plan Comments:        Anesthesia Quick Evaluation

## 2024-05-18 NOTE — Assessment & Plan Note (Signed)
 BP normal - Continue home amlodipine  bisoprolol 

## 2024-05-18 NOTE — Hospital Course (Addendum)
  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  Recently discharged after being treated for COPD flare with prolonged taper steroids.  Seen by pulmonary who was planning on bronchoscopy hopefully today.   Assessment & Plan:  Principal Problem:   Hemoptysis Active Problems:   Hypertension   Type 2 diabetes mellitus with microalbuminuria (HCC)   ILD (interstitial lung disease) (HCC)   COPD (chronic obstructive pulmonary disease) (HCC)   Malignant neoplasm of lower lobe of right lung (HCC)   Hemoptysis likely in setting of metastatic lung cancer Recurrent right upper lobe NSCLC s/p radiation - Seen by pulmonary, planning on bronchoscopy today. - Supportive care, monitor hemoglobin - Prednisone , taper over 2 weeks - Bactrim  discontinued  History of COPD with chronic hypoxia 2 L nasal cannula ILD - Continue home daily prednisone  and bronchodilators  Diabetes mellitus type 2 - Holding glipizide  and Jardiance.  Sliding scale and Accu-Chek  Essential hypertension - Amlodipine  and bisoprolol .  IV as needed  Hypokalemia - As needed repletion  Anemia of chronic disease - On iron supplements  Hyperlipidemia - Statin    DVT prophylaxis: SCDs Start: 05/18/24 1319    Code Status: Full Code Family Communication:   Plans for bronchoscopy today   Subjective: Seen at bedside awaiting her bronchoscopy today   Examination:  General exam: Appears calm and comfortable  Respiratory system: Clear to auscultation. Respiratory effort normal. Cardiovascular system: S1 & S2 heard, RRR. No JVD, murmurs, rubs, gallops or clicks. No pedal edema. Gastrointestinal system: Abdomen is nondistended, soft and nontender. No organomegaly or masses felt. Normal bowel sounds heard. Central nervous system: Alert and oriented. No focal neurological  deficits. Extremities: Symmetric 5 x 5 power. Skin: No rashes, lesions or ulcers Psychiatry: Judgement and insight appear normal. Mood & affect appropriate.

## 2024-05-18 NOTE — ED Provider Notes (Signed)
[place] EMERGENCY DEPARTMENT AT [facility] Provider Note   CSN: [id number] Arrival date & time: 05/18/24  4098     History  Chief Complaint  Patient presents with   Hemoptysis    Lindsey Williamson is a 73 y.o. female.  HPI    73 year old female with history of non-small cell lung cancer with previous history of chemo, radiation and immunotherapy, complicated by immune pneumonitis and postradiation pneumonitis comes in with chief complaint of hemoptysis.  Patient also has history of COPD, chronic respiratory failure on 2 L of oxygen.  Patient presents to the ER with hemoptysis.  She states that she had come to the ER on 5-20 with bloody sputum.  She had a CT at that time which was reassuring.  Her symptoms started resolving, and she actually had no hemoptysis yesterday.  However in the middle the night she woke up coughing and spit up a large amount of blood.  No clots were noted, blood was bright red.  Patient denies any recent illnesses.  She does not take any blood thinners.  Home Medications Prior to Admission medications   Medication Sig Start Date End Date Taking? Authorizing Provider  albuterol  (VENTOLIN  HFA) 108 (90 Base) MCG/ACT inhaler Inhale 2 puffs into the lungs every 6 (six) hours as needed for wheezing or shortness of breath. 04/11/24   Amin, Kiya Eno C, MD  amLODipine  (NORVASC ) 5 MG tablet Take 2.5 mg by mouth in the morning. 02/08/22   [provider]  aspirin  EC 81 MG tablet Take 81 mg by mouth daily. Swallow whole.    [provider]  azelastine  (ASTELIN ) 0.1 % nasal spray Place 2 sprays into both nostrils 2 (two) times daily as needed for rhinitis. 03/08/24   Orelia Binet, MD  bisoprolol  (ZEBETA ) 5 MG tablet Take 2.5 mg by mouth daily.    [provider]  cetirizine  (ZYRTEC  ALLERGY ) 10 MG tablet Take 1 tablet (10 mg total) by mouth daily. 03/08/24   Orelia Binet, MD  empagliflozin (JARDIANCE) 25 MG TABS tablet Take 25  mg by mouth daily at 6 PM. 02/08/22   [provider]  ferrous sulfate  325 (65 FE) MG tablet Take 325 mg by mouth daily with breakfast.    [provider]  fluticasone  (FLONASE ) 50 MCG/ACT nasal spray Place 1 spray into both nostrils daily. Patient taking differently: Place 1 spray into both nostrils daily as needed for allergies. 03/08/24   Orelia Binet, MD  glipiZIDE  (GLUCOTROL  XL) 5 MG 24 hr tablet Take 1 tablet (5 mg total) by mouth daily with breakfast. 04/11/24   Amin, Gwenette Wellons C, MD  guaiFENesin  (MUCINEX ) 600 MG 12 hr tablet Take 1 tablet (600 mg total) by mouth 2 (two) times daily. 03/07/24   Heilingoetter, Cassandra L, PA-C  guaiFENesin -dextromethorphan (ROBITUSSIN DM) 100-10 MG/5ML syrup Take 5 mLs by mouth every 4 (four) hours as needed for cough. 03/22/24   Margaretann Sharper, MD  insulin  aspart (NOVOLOG ) 100 UNIT/ML FlexPen Inject 0-11 Units into the skin 3 (three) times daily with meals. Check Blood Glucose (BG) and inject per scale: BG <150= 0 unit; BG 150-200= 1 unit; BG 201-250= 3 unit; BG 251-300= 5 unit; BG 301-350= 7 unit; BG 351-400= 9 unit; BG >400= 11 unit and Call Primary Care. 04/11/24   Amin, Gael Londo C, MD  ipratropium-albuterol  (DUONEB) 0.5-2.5 (3) MG/3ML SOLN Take 3 mLs by nebulization in the morning and at bedtime. 05/14/24   Raejean Bullock, NP  metFORMIN (GLUCOPHAGE-XR) 500  MG 24 hr tablet Take 1,000 mg by mouth in the morning and at bedtime. 02/08/22   [provider]  nystatin  (MYCOSTATIN /NYSTOP ) powder Apply 1 Application topically 3 (three) times daily. 12/15/23   Heilingoetter, Cassandra L, PA-C  polyethylene glycol (MIRALAX  / GLYCOLAX ) 17 g packet Take 17 g packet by mouth daily. 02/19/24   Leona Rake, MD  pravastatin  (PRAVACHOL ) 80 MG tablet Take 80 mg by mouth at bedtime.    [provider]  predniSONE  (DELTASONE ) 10 MG tablet Take 4 tablets (40 mg total) by mouth daily with breakfast for 14 days, THEN 3 tablets (30 mg total) daily  with breakfast for 7 days, THEN 2 tablets (20 mg total) daily with breakfast for 7 days, THEN 1 tablet (10 mg total) daily with breakfast. 04/12/24 06/09/24  Rand Burrs C, MD  predniSONE  (DELTASONE ) 10 MG tablet Take 1 tablet (10 mg total) by mouth daily with breakfast. 05/14/24   Raejean Bullock, NP  prochlorperazine  (COMPAZINE ) 10 MG tablet TAKE 1 TABLET(10 MG) BY MOUTH EVERY 6 HOURS AS NEEDED 11/11/23   Heilingoetter, Cassandra L, PA-C  sulfamethoxazole -trimethoprim  (BACTRIM  DS) 800-160 MG tablet Take 1 tablet by mouth 3 (three) times a week. 04/11/24   Amin, Mubashir Mallek C, MD  umeclidinium-vilanterol (ANORO ELLIPTA ) 62.5-25 MCG/ACT AEPB Inhale 1 puff into the lungs daily. 05/09/24   Raejean Bullock, NP  umeclidinium-vilanterol (ANORO ELLIPTA ) 62.5-25 MCG/ACT AEPB Inhale 1 puff into the lungs daily. 05/14/24 11/14/2024  Raejean Bullock, NP      Allergies    Benzonatate, Lisinopril, Aspirin , Azithromycin , and Codeine    Review of Systems   Review of Systems  All other systems reviewed and are negative.   Physical Exam Updated Vital Signs BP (!) 150/106 (BP Location: Left Arm)   Pulse 90   Temp 97.9 F (36.6 C) (Oral)   Resp 19   Ht 5\' 4"  (1.626 m)   Wt 68.5 kg   SpO2 100%   BMI 25.92 kg/m  Physical Exam Vitals and nursing note reviewed.  Constitutional:      Appearance: She is well-developed.  HENT:     Head: Atraumatic.  Cardiovascular:     Rate and Rhythm: Normal rate.  Pulmonary:     Effort: Pulmonary effort is normal.     Breath sounds: Rhonchi present. No wheezing.  Musculoskeletal:     Cervical back: Normal range of motion and neck supple.  Skin:    General: Skin is warm and dry.  Neurological:     Mental Status: She is alert and oriented to person, place, and time.     ED Results / Procedures / Treatments   Labs (all labs ordered are listed, but only abnormal results are displayed) Labs Reviewed  BASIC METABOLIC PANEL WITH GFR - Abnormal; Notable for the following  components:      Result Value   Sodium 134 (*)    Potassium 3.4 (*)    Glucose, Bld 130 (*)    BUN 30 (*)    All other components within normal limits  CBC WITH DIFFERENTIAL/PLATELET - Abnormal; Notable for the following components:   RBC 3.85 (*)    Hemoglobin 10.6 (*)    HCT 34.3 (*)    RDW 17.2 (*)    All other components within normal limits  PROTIME-INR    EKG None  Radiology DG Chest Port 1 View Result Date: 05/18/2024 CLINICAL DATA:  Hemoptysis. EXAM: PORTABLE CHEST 1 VIEW COMPARISON:  05/14/2024 FINDINGS: The cardio pericardial  silhouette is enlarged. Low lung volumes with persistent right base interstitial and airspace disease, potentially scarring from prior therapy for lung cancer. Interstitial markings are diffusely coarsened with chronic features. No acute bony abnormality. IMPRESSION: Stable exam. Low lung volumes with persistent right base interstitial and airspace disease, potentially scarring from prior therapy for lung cancer. Electronically Signed   By: Donnal Fusi M.D.   On: 05/18/2024 07:49    Procedures Procedures    Medications Ordered in ED Medications  tranexamic acid  (CYKLOKAPRON ) 1000 MG/10ML nebulizer solution 500 mg (has no administration in time range)    ED Course/ Medical Decision Making/ A&P                                 Medical Decision Making Amount and/or Complexity of Data Reviewed Labs: ordered.  Risk Decision regarding hospitalization.   This patient presents to the ED with chief complaint(s) of hemoptysis with pertinent past medical history of non-small cell lung cancer status post radiation, chemo and immunotherapy with complications from the treatment, chronic respiratory failure on 2 L of oxygen.The complaint involves an extensive differential diagnosis and also carries with it a high risk of complications and morbidity.    The differential diagnosis includes : Worsening cancer, bronchiectasis, bronchitis, pneumonia  The  initial plan is to get basic labs.  I will also get x-ray of the chest.  I have reviewed previous CT PE, there is no reason to repeat CT scan at this time.   Additional history obtained: Additional history obtained from family Records reviewed oncology notes, pulm. Notes and also reviewed recent CT PE  Independent labs interpretation:  The following labs were independently interpreted: Patient's hemoglobin is 10.6, down from 11.7.  Platelet count is normal.  Metabolic profile is reassuring besides BUN of 30.   Independent visualization and interpretation of imaging: - I independently visualized the following imaging with scope of interpretation limited to determining acute life threatening conditions related to emergency care: X-ray of the chest, which revealed no evidence of pneumonia.  Treatment and Reassessment: Patient has BUN/creatinine ratio of greater than 30.  I question again to the patient if she was having any bloody emesis.  She states that she woke up with cough and in general it has been a cough mediated process.  Low suspicion for upper GI bleed at this time.  Consultation: - Consulted or discussed management/test interpretation with external professional: Pulmonary service.  I spoke with Dr. Alva, they will see the patient.    9:30 AM Pulmonary team has seen the patient.  Patient remains anxious about the hemoptysis.  They recommend admission to the hospital.  TXA nebs for now.  Patient is on steroids.  No antibiotics per APP who saw the patient for now.  Final Clinical Impression(s) / ED Diagnoses Final diagnoses:  Hemoptysis    Rx / DC Orders ED Discharge Orders     None         Deatra Face, MD 05/18/24 0930

## 2024-05-18 NOTE — Assessment & Plan Note (Signed)
 GLucose normal - Hold glipizide  and Jardiance , metformin and home Novolog  - Start SS correction insulin 

## 2024-05-18 NOTE — Consult Note (Signed)
NAME:  Lindsey Williamson, MRN:  [id number], DOB:  November 30, 1951, LOS: 0 ADMISSION DATE:  05/18/2024, CONSULTATION DATE:  05/18/24 REFERRING MD:  dr. Lewis Red - EDP, CHIEF COMPLAINT: Hemoptysis  History of Present Illness:  Lindsey Williamson is a 73 year old female with a past medical history significant for recurrent right upper lobe stage Ia non-small cell lung cancer s/p radiation intolerant to immunotherapy due to development of pneumonitis, COPD, chronic hypoxic respiratory failure on 2 to 3 L nasal cannula at baseline, HTN, former smoker, and diabetes presented to the ED at [facility] on 5/23 with complaints of hemoptysis.  Patient states hemoptysis began at night prior to admission and is described as bright in color with no underlying mucus.  Patient additionally reports that she was seen in the ED at this facility 5/19 for same problem, CTA chest stable at that time.  No signs on admission within normal limits apart from mild hypertension.  Lab work significant for sodium 134, potassium 3.4, hemoglobin 10.6 down trended from 11.7 on 5/19.  PCCM consult for further assistance in management.  Pertinent  Medical History  Recurrent right upper lobe stage Ia non-small cell lung cancer s/p radiation intolerant to immunotherapy due to development of pneumonitis, COPD, chronic hypoxic respiratory failure on 2 to 3 L nasal cannula at baseline, HTN, former smoker, and diabetes   Significant Hospital Events: Including procedures, antibiotic start and stop dates in addition to other pertinent events   5/23 presented for recurrent hemoptysis  Interim History / Subjective:  Seen sitting up in ED stretcher anxious but in no acute distress  Objective    Blood pressure (!) 150/106, pulse 90, temperature 97.9 F (36.6 C), temperature source Oral, resp. rate 19, height 5\' 4"  (1.626 m), weight 68.5 kg, SpO2 100%.       No intake or output data in the 24 hours ending 05/18/24 0847 Filed Weights    05/18/24 1191  Weight: 68.5 kg    Examination: General: Acute on chronic ill-appearing deconditioned elderly female sitting up on ED stretcher in no acute distress HEENT: [place]/AT, MM pink/moist, PERRL,  Neuro: Alert and oriented x 3, nonfocal CV: s1s2 regular rate and rhythm, no murmur, rubs, or gallops,  PULM: To auscultation bilaterally, diminished right base, no increased work of breathing, on 2 L nasal cannula GI: soft, bowel sounds active in all 4 quadrants, non-tender, non-distended, tolerating Extremities: warm/dry, no oral diet edema  Skin: no rashes or lesions   Resolved problem list   Assessment and Plan  Hemoptysis in the setting of known metastatic lung cancer  Recurrent right upper lobe stage IIIa non-small cell lung cancer s/p radiation intolerant to immunotherapy due to development of pneumonitis 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 COPD, former smoker Chronic hypoxic respiratory failure on 2 to 3 L nasal cannula at baseline P: Admit under observation per [facility] with PCCM consult No current acute indications for bronchoscopy at this time Continue Prednisone  10mg  daily  Continue prophylactic Bactrim  As needed TXA nebs Continue supplemental oxygen ensure humidification is added  Outpatient oncology follow-up Mobilize as able Can have oral diet with no tentative plan for procedure Follow-up with outpatient oncology and pulmonary  Best Practice (right click and "Reselect all SmartList Selections" daily)  Per primary   Labs   CBC: Recent Labs  Lab 05/14/24 1844 05/18/24 0809  WBC 6.1 6.4  NEUTROABS 5.3 4.6  HGB 11.7* 10.6*  HCT 39.1 34.3*  MCV 88.7 89.1  PLT 334 273    Basic Metabolic Panel: Recent Labs  Lab 05/14/24 1844 05/18/24 0809  NA 136 134*  K 4.2 3.4*  CL 103 100  CO2 23 24  GLUCOSE 340* 130*   BUN 20 30*  CREATININE 0.74 0.57  CALCIUM 8.9 9.4   GFR: Estimated Creatinine Clearance: 59.5 mL/min (by C-G formula based on SCr of 0.57 mg/dL). Recent Labs  Lab 05/14/24 1844 05/18/24 0809  WBC 6.1 6.4    Liver Function Tests: Recent Labs  Lab 05/14/24 1844  AST 15  ALT 7  ALKPHOS 65  BILITOT 0.5  PROT 8.0  ALBUMIN 3.5   No results for input(s): "LIPASE", "AMYLASE" in the last 168 hours. No results for input(s): "AMMONIA" in the last 168 hours.  ABG No results found for: "PHART", "PCO2ART", "PO2ART", "HCO3", "TCO2", "ACIDBASEDEF", "O2SAT"   Coagulation Profile: Recent Labs  Lab 05/18/24 0809  INR 0.9    Cardiac Enzymes: No results for input(s): "CKTOTAL", "CKMB", "CKMBINDEX", "TROPONINI" in the last 168 hours.  HbA1C: Hgb A1c MFr Bld  Date/Time Value Ref Range Status  02/16/2024 05:59 AM 7.4 (H) 4.8 - 5.6 % Final    Comment:    (NOTE) Pre diabetes:          5.7%-6.4%  Diabetes:              >6.4%  Glycemic control for   <7.0% adults with diabetes   10/17/2023 11:27 PM 8.3 (H) 4.8 - 5.6 % Final    Comment:    (NOTE) Pre diabetes:          5.7%-6.4%  Diabetes:              >6.4%  Glycemic control for   <7.0% adults with diabetes     CBG: No results for input(s): "GLUCAP" in the last 168 hours.  Review of Systems:   Please see the history of present illness. All other systems reviewed and are negative   Past Medical History:  She,  has a past medical history of Angioedema (10/17/2023), Arthritis, Diabetes mellitus without complication (HCC), and Hypertension.   Surgical History:   Past Surgical History:  Procedure Laterality Date   BRONCHIAL BIOPSY  03/30/2022   Procedure: BRONCHIAL BIOPSIES;  Surgeon: Prudy Brownie, DO;  Location: [facility] ENDOSCOPY;  Service: Pulmonary;;   BRONCHIAL BIOPSY  10/17/2023   Procedure: BRONCHIAL BIOPSIES;  Surgeon: Denson Flake, MD;  Location: [facility] ENDOSCOPY;  Service: Pulmonary;;   BRONCHIAL BRUSHINGS   03/30/2022   Procedure: BRONCHIAL BRUSHINGS;  Surgeon: Prudy Brownie, DO;  Location: [facility] ENDOSCOPY;  Service: Pulmonary;;   BRONCHIAL BRUSHINGS  10/17/2023   Procedure: BRONCHIAL BRUSHINGS;  Surgeon: Denson Flake, MD;  Location: [facility] ENDOSCOPY;  Service: Pulmonary;;   BRONCHIAL NEEDLE ASPIRATION BIOPSY  03/30/2022   Procedure: BRONCHIAL NEEDLE ASPIRATION BIOPSIES;  Surgeon: Prudy Brownie, DO;  Location: [facility] ENDOSCOPY;  Service: Pulmonary;;   BRONCHIAL NEEDLE ASPIRATION BIOPSY  10/17/2023   Procedure: BRONCHIAL NEEDLE ASPIRATION BIOPSIES;  Surgeon: Denson Flake, MD;  Location: [facility] ENDOSCOPY;  Service: Pulmonary;;   EYE SURGERY     FIDUCIAL MARKER PLACEMENT  03/30/2022   Procedure: FIDUCIAL MARKER PLACEMENT;  Surgeon: Prudy Brownie, DO;  Location: [facility] ENDOSCOPY;  Service: Pulmonary;;   FINE NEEDLE ASPIRATION  10/17/2023   Procedure: FINE NEEDLE ASPIRATION;  Surgeon: Denson Flake, MD;  Location: [facility] ENDOSCOPY;  Service: Pulmonary;;  FOREIGN BODY REMOVAL  03/30/2022   Procedure: FOREIGN BODY REMOVAL;  Surgeon: Prudy Brownie, DO;  Location: [facility] ENDOSCOPY;  Service: Pulmonary;;   HEMOSTASIS CONTROL  10/17/2023   Procedure: HEMOSTASIS CONTROL;  Surgeon: Denson Flake, MD;  Location[facility] ENDOSCOPY;  Service: Pulmonary;;   VIDEO BRONCHOSCOPY WITH ENDOBRONCHIAL ULTRASOUND Bilateral 03/30/2022   Procedure: VIDEO BRONCHOSCOPY WITH ENDOBRONCHIAL ULTRASOUND;  Surgeon: Prudy Brownie, DO;  Location: [facility] ENDOSCOPY;  Service: Pulmonary;  Laterality: Bilateral;   VIDEO BRONCHOSCOPY WITH ENDOBRONCHIAL ULTRASOUND Right 10/17/2023   Procedure: VIDEO BRONCHOSCOPY WITH ENDOBRONCHIAL ULTRASOUND;  Surgeon: Denson Flake, MD;  Location[facility] ENDOSCOPY;  Service: Pulmonary;  Laterality: Right;   VIDEO BRONCHOSCOPY WITH RADIAL ENDOBRONCHIAL ULTRASOUND  03/30/2022   Procedure: VIDEO BRONCHOSCOPY WITH RADIAL ENDOBRONCHIAL ULTRASOUND;  Surgeon: Prudy Brownie, DO;  Location: [facility] ENDOSCOPY;  Service: Pulmonary;;      Social History:   reports that she quit smoking about 16 months ago. Her smoking use included cigarettes. She has been exposed to tobacco smoke. She has never used smokeless tobacco. She reports that she does not drink alcohol and does not use drugs.   Family History:  Her family history includes Allergic rhinitis in her sister; Asthma in her sister. There is no history of Angioedema, Atopy, Immunodeficiency, Urticaria, or Eczema.   Allergies Allergies  Allergen Reactions   Benzonatate Palpitations   Lisinopril Swelling and Other (See Comments)    Patient ended up in the ED with a swollen mouth and tongue   Aspirin  Nausea And Vomiting and Palpitations    Can tolerate baby aspirin  81 mg without difficulty but can't tolerate higher dosages   Azithromycin  Palpitations   Codeine Palpitations     Home Medications  Prior to Admission medications   Medication Sig Start Date End Date Taking? Authorizing Provider  albuterol  (VENTOLIN  HFA) 108 (90 Base) MCG/ACT inhaler Inhale 2 puffs into the lungs every 6 (six) hours as needed for wheezing or shortness of breath. 04/11/24   Amin, Ankit C, MD  amLODipine  (NORVASC ) 5 MG tablet Take 2.5 mg by mouth in the morning. 02/08/22   [provider]  aspirin  EC 81 MG tablet Take 81 mg by mouth daily. Swallow whole.    [provider]  azelastine  (ASTELIN ) 0.1 % nasal spray Place 2 sprays into both nostrils 2 (two) times daily as needed for rhinitis. 03/08/24   Orelia Binet, MD  bisoprolol  (ZEBETA ) 5 MG tablet Take 2.5 mg by mouth daily.    [provider]  cetirizine  (ZYRTEC  ALLERGY ) 10 MG tablet Take 1 tablet (10 mg total) by mouth daily. 03/08/24   Orelia Binet, MD  empagliflozin (JARDIANCE) 25 MG TABS tablet Take 25 mg by mouth daily at 6 PM. 02/08/22   [provider]  ferrous sulfate  325 (65 FE) MG tablet Take 325 mg by mouth daily with breakfast.    [provider]  fluticasone  (FLONASE ) 50 MCG/ACT  nasal spray Place 1 spray into both nostrils daily. Patient taking differently: Place 1 spray into both nostrils daily as needed for allergies. 03/08/24   Orelia Binet, MD  glipiZIDE  (GLUCOTROL  XL) 5 MG 24 hr tablet Take 1 tablet (5 mg total) by mouth daily with breakfast. 04/11/24   Amin, Ankit C, MD  guaiFENesin  (MUCINEX ) 600 MG 12 hr tablet Take 1 tablet (600 mg total) by mouth 2 (two) times daily. 03/07/24   Heilingoetter, Cassandra L, PA-C  guaiFENesin -dextromethorphan (ROBITUSSIN DM) 100-10 MG/5ML syrup Take 5 mLs by mouth every 4 (  four) hours as needed for cough. 03/22/24   Margaretann Sharper, MD  insulin  aspart (NOVOLOG ) 100 UNIT/ML FlexPen Inject 0-11 Units into the skin 3 (three) times daily with meals. Check Blood Glucose (BG) and inject per scale: BG <150= 0 unit; BG 150-200= 1 unit; BG 201-250= 3 unit; BG 251-300= 5 unit; BG 301-350= 7 unit; BG 351-400= 9 unit; BG >400= 11 unit and Call Primary Care. 04/11/24   Amin, Ankit C, MD  ipratropium-albuterol  (DUONEB) 0.5-2.5 (3) MG/3ML SOLN Take 3 mLs by nebulization in the morning and at bedtime. 05/14/24   Raejean Bullock, NP  metFORMIN (GLUCOPHAGE-XR) 500 MG 24 hr tablet Take 1,000 mg by mouth in the morning and at bedtime. 02/08/22   [provider]  nystatin  (MYCOSTATIN /NYSTOP ) powder Apply 1 Application topically 3 (three) times daily. 12/15/23   Heilingoetter, Cassandra L, PA-C  polyethylene glycol (MIRALAX  / GLYCOLAX ) 17 g packet Take 17 g packet by mouth daily. 02/19/24   Leona Rake, MD  pravastatin  (PRAVACHOL ) 80 MG tablet Take 80 mg by mouth at bedtime.    [provider]  predniSONE  (DELTASONE ) 10 MG tablet Take 4 tablets (40 mg total) by mouth daily with breakfast for 14 days, THEN 3 tablets (30 mg total) daily with breakfast for 7 days, THEN 2 tablets (20 mg total) daily with breakfast for 7 days, THEN 1 tablet (10 mg total) daily with breakfast. 04/12/24 06/09/24  Amin, Ankit C, MD  predniSONE  (DELTASONE ) 10 MG tablet  Take 1 tablet (10 mg total) by mouth daily with breakfast. 05/14/24   Raejean Bullock, NP  prochlorperazine  (COMPAZINE ) 10 MG tablet TAKE 1 TABLET(10 MG) BY MOUTH EVERY 6 HOURS AS NEEDED 11/11/23   Heilingoetter, Cassandra L, PA-C  sulfamethoxazole -trimethoprim  (BACTRIM  DS) 800-160 MG tablet Take 1 tablet by mouth 3 (three) times a week. 04/11/24   Amin, Ankit C, MD  umeclidinium-vilanterol (ANORO ELLIPTA ) 62.5-25 MCG/ACT AEPB Inhale 1 puff into the lungs daily. 05/09/24   Raejean Bullock, NP  umeclidinium-vilanterol (ANORO ELLIPTA ) 62.5-25 MCG/ACT AEPB Inhale 1 puff into the lungs daily. 05/14/24 11/01/2024  Raejean Bullock, NP     Critical care time: NA  Ivery Michalski D. Harris, NP-C  Pulmonary & Critical Care Personal contact information can be found on Amion  If no contact or response made please call [phone] 05/18/2024, 10:15 AM

## 2024-05-18 NOTE — Assessment & Plan Note (Signed)
 Hemoptysis at home "blood covering my tongue", none since.  Hgb stable here from baseline.  On EBUS last Oct "evolving endobronchial lesion in the mid to distal bronchus intermedius extending down to the right lower lobe orifice where there was a more distinct raised endobronchial lesion."  Likely site of bleeding.  Pulm were consulted and offered to confirm this with repeat bronchoscopy today, so that if massive hemoptysis were to occur in the future, there would be a clearly known target for embolization. - Observe overnight - TXA neb can be repeated if repeat hemoptysis - Bronchoscopy tonight - If stable overnight, can d/c tomorrow

## 2024-05-18 NOTE — ED Notes (Signed)
 Possible bronch at 1530. NPO until then unless Dr says otherwise

## 2024-05-18 NOTE — Assessment & Plan Note (Signed)
 Chronic respiratory failure on 2L at home No wheezing, no active flare - Continue as needed albuterol , Astelin , Zyrtec , Flonase , Mucinex  - DuoNeb as needed - Continue daily prednisone  10 mg - Continue LABA/LAMA

## 2024-05-18 NOTE — TOC Initial Note (Signed)
 Transition of Care [facility]) - Initial/Assessment Note    Patient Details  Name: Lindsey Williamson MRN: [id number] Date of Birth: 04/25/1951  [facility]) CM/SW Contact:    Ruben Corolla, RN Phone Number: 05/18/2024, 4:04 PM  Clinical Narrative:  d/c plan home. Has home 02-[employer] has travel tank.                 Expected Discharge Plan: Home/Self Care Barriers to Discharge: Continued Medical Work up   Patient Goals and CMS Choice Patient states their goals for this hospitalization and ongoing recovery are:: Home CMS Medicare.gov Compare Post Acute Care list provided to:: Patient Choice offered to / list presented to : Patient [place] ownership interest in [facility].provided to:: Patient    Expected Discharge Plan and Services   Discharge Planning Services: CM Consult Post Acute Care Choice: Resumption of Svcs/PTA Provider Living arrangements for the past 2 months: Single Family Home                                      Prior Living Arrangements/Services Living arrangements for the past 2 months: Single Family Home Lives with:: Spouse              Current home services: DME ([employer]-home 02 has travel tank)    Activities of Daily Living   ADL Screening (condition at time of admission) Independently performs ADLs?: Yes (appropriate for developmental age) Is the patient deaf or have difficulty hearing?: No Does the patient have difficulty seeing, even when wearing glasses/contacts?: No Does the patient have difficulty concentrating, remembering, or making decisions?: No  Permission Sought/Granted                  Emotional Assessment              Admission diagnosis:  Hemoptysis [R04.2] Patient Active Problem List   Diagnosis Date Noted   Hemoptysis 05/18/2024   Malignant neoplasm of lower lobe of right lung (HCC) 05/18/2024   Pneumonitis 04/06/2024   Acute on chronic hypoxic respiratory failure (HCC) 04/06/2024    ILD (interstitial lung disease) (HCC) 04/06/2024   COPD (chronic obstructive pulmonary disease) (HCC) 04/06/2024   NSCLC of right lung (HCC) 04/06/2024   Hypoxia 02/13/2024   Encounter for antineoplastic immunotherapy 02/09/2024   Encounter for antineoplastic chemotherapy 11/15/2023   Goals of care, counseling/discussion 10/25/2023   SOB (shortness of breath) 10/18/2023   Angioedema 10/17/2023   Bleeding per rectum 07/30/2022   Diverticular disease of colon 07/30/2022   First degree hemorrhoids 07/30/2022   Hyperglycemia due to type 2 diabetes mellitus (HCC) 07/30/2022   Primary cancer of right lower lobe of lung (HCC) 04/08/2022   Lung nodule 03/22/2022   Dizziness 05/19/2020   Allergic rhinitis with a predominant nonallergic component. 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   Tobacco abuse 12/16/2014   Hyperlipidemia 12/12/2013   Hypertension 12/12/2013   PCP:  Claudell Cruz, MD Pharmacy:   [facility] [phone] - [place] - [address] AT [facility]  OF [facility] [address] [place] [address] Phone: [phone] Fax: [phone]  [facility] - [facility] [address] [place] [address] Phone: [phone] Fax: [phone]  [facility] - [address], [place] - [address] [place] [address] Phone: [phone] Fax: [phone]     Social Drivers of Health (SDOH) Social History: SDOH Screenings   Food Insecurity: No Food Insecurity (05/18/2024)  Housing: Low Risk   (05/18/2024)  Transportation Needs: No Transportation Needs (05/18/2024)  Utilities: Not At Risk (05/18/2024)  Financial Resource Strain: Low Risk  (04/24/2024)   Received from [facility]  Physical Activity: Inactive (04/24/2024)   Received from [facility]  Social Connections: Socially Integrated (05/18/2024)  Stress: Stress Concern Present (04/24/2024)   Received from [facility]  Tobacco Use: Medium Risk (05/18/2024)   SDOH Interventions:     Readmission Risk Interventions    02/18/2024    9:51 AM  Readmission Risk Prevention Plan  Transportation Screening Complete  PCP or Specialist Appt within 3-5 Days Complete  HRI or Home Care Consult Complete  Social Work Consult for Recovery Care Planning/Counseling Complete  Palliative Care Screening Not Applicable  Medication Review Oceanographer) Complete

## 2024-05-19 ENCOUNTER — Encounter (HOSPITAL_COMMUNITY): Payer: Self-pay | Admitting: Family Medicine

## 2024-05-19 ENCOUNTER — Encounter (HOSPITAL_COMMUNITY)
Admission: EM | Disposition: A | Discharge status: Home / Self Care | Payer: Self-pay | Source: Home / Self Care | Attending: Internal Medicine

## 2024-05-19 ENCOUNTER — Observation Stay (HOSPITAL_COMMUNITY)

## 2024-05-19 DIAGNOSIS — R042 Hemoptysis: Secondary | ICD-10-CM

## 2024-05-19 DIAGNOSIS — I10 Essential (primary) hypertension: Secondary | ICD-10-CM

## 2024-05-19 DIAGNOSIS — C342 Malignant neoplasm of middle lobe, bronchus or lung: Secondary | ICD-10-CM

## 2024-05-19 DIAGNOSIS — J449 Chronic obstructive pulmonary disease, unspecified: Secondary | ICD-10-CM | POA: Diagnosis not present

## 2024-05-19 DIAGNOSIS — C3491 Malignant neoplasm of unspecified part of right bronchus or lung: Secondary | ICD-10-CM | POA: Diagnosis not present

## 2024-05-19 DIAGNOSIS — Z87891 Personal history of nicotine dependence: Secondary | ICD-10-CM | POA: Diagnosis not present

## 2024-05-19 DIAGNOSIS — C3431 Malignant neoplasm of lower lobe, right bronchus or lung: Secondary | ICD-10-CM | POA: Diagnosis not present

## 2024-05-19 LAB — GLUCOSE, CAPILLARY
Glucose-Capillary: 146 mg/dL — ABNORMAL HIGH (ref 70–99)
Glucose-Capillary: 117 mg/dL — ABNORMAL HIGH (ref 70–99)
Glucose-Capillary: 446 mg/dL — ABNORMAL HIGH (ref 70–99)
Glucose-Capillary: 448 mg/dL — ABNORMAL HIGH (ref 70–99)
Glucose-Capillary: 231 mg/dL — ABNORMAL HIGH (ref 70–99)
Glucose-Capillary: 262 mg/dL — ABNORMAL HIGH (ref 70–99)

## 2024-05-19 LAB — COMPREHENSIVE METABOLIC PANEL WITH GFR
ALT: 6 U/L (ref 0–44)
AST: 11 U/L — ABNORMAL LOW (ref 15–41)
Albumin: 3 g/dL — ABNORMAL LOW (ref 3.5–5.0)
Alkaline Phosphatase: 57 U/L (ref 38–126)
Anion gap: 8 (ref 5–15)
BUN: 24 mg/dL — ABNORMAL HIGH (ref 8–23)
CO2: 24 mmol/L (ref 22–32)
Calcium: 9.1 mg/dL (ref 8.9–10.3)
Chloride: 104 mmol/L (ref 98–111)
Creatinine, Ser: 0.85 mg/dL (ref 0.44–1.00)
GFR, Estimated: >60 mL/min (ref >60)
Glucose, Bld: 137 mg/dL — ABNORMAL HIGH (ref 70–99)
Potassium: 3.8 mmol/L (ref 3.5–5.1)
Sodium: 136 mmol/L (ref 135–145)
Total Bilirubin: 0.4 mg/dL (ref 0.0–1.2)
Total Protein: 7.2 g/dL (ref 6.5–8.1)

## 2024-05-19 LAB — CBC
HCT: 36.6 % (ref 36.0–46.0)
Hemoglobin: 11 g/dL — ABNORMAL LOW (ref 12.0–15.0)
MCH: 26.9 pg (ref 26.0–34.0)
MCHC: 30.1 g/dL (ref 30.0–36.0)
MCV: 89.5 fL (ref 80.0–100.0)
Platelets: 304 10*3/uL (ref 150–400)
RBC: 4.09 MIL/uL (ref 3.87–5.11)
RDW: 17.2 % — ABNORMAL HIGH (ref 11.5–15.5)
WBC: 5.8 10*3/uL (ref 4.0–10.5)
nRBC: 0 % (ref 0.0–0.2)

## 2024-05-19 SURGERY — VIDEO BRONCHOSCOPY WITHOUT FLUORO
Anesthesia: General | Laterality: Bilateral

## 2024-05-19 MED ORDER — LIDOCAINE 2% (20 MG/ML) 5 ML SYRINGE
INTRAMUSCULAR | Status: DC | PRN
  Administered 2024-05-19: 100 mg via INTRAVENOUS

## 2024-05-19 MED ORDER — SUGAMMADEX SODIUM 200 MG/2ML IV SOLN
INTRAVENOUS | Status: DC | PRN
  Administered 2024-05-19: 300 mg via INTRAVENOUS

## 2024-05-19 MED ORDER — ROCURONIUM BROMIDE 10 MG/ML (PF) SYRINGE
PREFILLED_SYRINGE | INTRAVENOUS | Status: DC | PRN
  Administered 2024-05-19: 40 mg via INTRAVENOUS

## 2024-05-19 MED ORDER — FENTANYL CITRATE (PF) 100 MCG/2ML IJ SOLN: 25.0000 ug | INTRAMUSCULAR | Status: DC | PRN

## 2024-05-19 MED ORDER — OXYCODONE HCL 5 MG PO TABS: 5.0000 mg | ORAL_TABLET | Freq: Once | ORAL | Status: DC | PRN

## 2024-05-19 MED ORDER — OXYCODONE HCL 5 MG/5ML PO SOLN: 5.0000 mg | Freq: Once | ORAL | Status: DC | PRN

## 2024-05-19 MED ORDER — LACTATED RINGERS IV SOLN: INTRAVENOUS | Status: DC | PRN

## 2024-05-19 MED ORDER — TRAZODONE HCL 50 MG PO TABS: 50.0000 mg | ORAL_TABLET | Freq: Every evening | ORAL | Status: DC | PRN

## 2024-05-19 MED ORDER — EPINEPHRINE PF 1 MG/ML IJ SOLN
INTRAMUSCULAR | Status: DC | PRN
  Administered 2024-05-19: 1 mL

## 2024-05-19 MED ORDER — FENTANYL CITRATE (PF) 250 MCG/5ML IJ SOLN
INTRAMUSCULAR | Status: DC | PRN
  Administered 2024-05-19 (×2): 50 ug via INTRAVENOUS

## 2024-05-19 MED ORDER — PANTOPRAZOLE SODIUM 40 MG PO TBEC
40.0000 mg | DELAYED_RELEASE_TABLET | Freq: Two times a day (BID) | ORAL | Status: DC
  Administered 2024-05-19 – 2024-05-20 (×3): 40 mg via ORAL
  Filled 2024-05-19 (×3): qty 1

## 2024-05-19 MED ORDER — EPINEPHRINE 1 MG/10ML IJ SOSY
PREFILLED_SYRINGE | INTRAMUSCULAR | Status: AC
Start: 2024-05-19 — End: ?
  Filled 2024-05-19: qty 10

## 2024-05-19 MED ORDER — METOPROLOL TARTRATE 5 MG/5ML IV SOLN: 5.0000 mg | INTRAVENOUS | Status: DC | PRN

## 2024-05-19 MED ORDER — PROPOFOL 10 MG/ML IV BOLUS
INTRAVENOUS | Status: DC | PRN
  Administered 2024-05-19: 140 mg via INTRAVENOUS

## 2024-05-19 MED ORDER — LIDOCAINE 2% (20 MG/ML) 5 ML SYRINGE: INTRAMUSCULAR | Status: DC | PRN

## 2024-05-19 MED ORDER — DEXAMETHASONE SODIUM PHOSPHATE 10 MG/ML IJ SOLN
INTRAMUSCULAR | Status: DC | PRN
  Administered 2024-05-19: 5 mg via INTRAVENOUS

## 2024-05-19 MED ORDER — ONDANSETRON HCL 4 MG/2ML IJ SOLN: 4.0000 mg | Freq: Once | INTRAMUSCULAR | Status: DC | PRN

## 2024-05-19 MED ORDER — GUAIFENESIN 100 MG/5ML PO LIQD: 5.0000 mL | ORAL | Status: DC | PRN

## 2024-05-19 MED ORDER — HYDRALAZINE HCL 20 MG/ML IJ SOLN: 10.0000 mg | INTRAMUSCULAR | Status: DC | PRN

## 2024-05-19 MED ORDER — ONDANSETRON HCL 4 MG/2ML IJ SOLN
INTRAMUSCULAR | Status: DC | PRN
  Administered 2024-05-19: 4 mg via INTRAVENOUS

## 2024-05-19 MED ORDER — MEPERIDINE HCL 50 MG/ML IJ SOLN: 6.2500 mg | INTRAMUSCULAR | Status: DC | PRN

## 2024-05-19 MED ORDER — SODIUM CHLORIDE 0.9 % IV SOLN
INTRAVENOUS | Status: AC | PRN
  Administered 2024-05-19: 500 mL via INTRAVENOUS

## 2024-05-19 MED ORDER — DEXMEDETOMIDINE HCL IN NACL 80 MCG/20ML IV SOLN
INTRAVENOUS | Status: AC
  Filled 2024-05-19: qty 20

## 2024-05-19 MED ORDER — SENNOSIDES-DOCUSATE SODIUM 8.6-50 MG PO TABS: 1.0000 | ORAL_TABLET | Freq: Every evening | ORAL | Status: DC | PRN

## 2024-05-19 MED ORDER — MENTHOL 3 MG MT LOZG
1.0000 | LOZENGE | OROMUCOSAL | Status: DC | PRN
Start: 2024-05-19 — End: 2024-05-20
  Administered 2024-05-19: 3 mg via ORAL
  Filled 2024-05-19: qty 9

## 2024-05-19 MED ORDER — FENTANYL CITRATE (PF) 100 MCG/2ML IJ SOLN
INTRAMUSCULAR | Status: AC
  Filled 2024-05-19: qty 2

## 2024-05-19 NOTE — Progress Notes (Signed)
 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  Recently discharged after being treated for COPD flare with prolonged taper steroids.  Seen by pulmonary who was planning on bronchoscopy hopefully today.   Assessment & Plan:  Principal Problem:   Hemoptysis Active Problems:   Hypertension   Type 2 diabetes mellitus with microalbuminuria (HCC)   ILD (interstitial lung disease) (HCC)   COPD (chronic obstructive pulmonary disease) (HCC)   Malignant neoplasm of lower lobe of right lung (HCC)   Hemoptysis likely in setting of metastatic lung cancer Recurrent right upper lobe NSCLC s/p radiation - Seen by pulmonary, planning on bronchoscopy today. - Supportive care, monitor hemoglobin - Prednisone , taper over 2 weeks - Bactrim  discontinued  History of COPD with chronic hypoxia 2 L nasal cannula ILD - Continue home daily prednisone  and bronchodilators  Diabetes mellitus type 2 - Holding glipizide  and Jardiance.  Sliding scale and Accu-Chek  Essential hypertension - Amlodipine  and bisoprolol .  IV as needed  Hypokalemia - As needed repletion  Anemia of chronic disease - On iron supplements  Hyperlipidemia - Statin    DVT prophylaxis: SCDs Start: 05/18/24 1319    Code Status: Full Code Family Communication:   Plans for bronchoscopy today   Subjective: Seen at bedside awaiting her bronchoscopy today   Examination:  General exam: Appears calm and comfortable  Respiratory system: Clear to auscultation. Respiratory effort normal. Cardiovascular system: S1 & S2 heard, RRR. No JVD, murmurs, rubs, gallops or clicks. No pedal edema. Gastrointestinal system: Abdomen is nondistended, soft and nontender. No organomegaly or masses  felt. Normal bowel sounds heard. Central nervous system: Alert and oriented. No focal neurological deficits. Extremities: Symmetric 5 x 5 power. Skin: No rashes, lesions or ulcers Psychiatry: Judgement and insight appear normal. Mood & affect appropriate.                Diet Orders (From admission, onward)     Start     Ordered   05/19/24 0001  Diet NPO time specified  Diet effective midnight        05/18/24 1529            Objective: Vitals:   05/19/24 0113 05/19/24 0544 05/19/24 0756 05/19/24 1112  BP: (!) 169/86 (!) 152/92  138/70  Pulse: 91 83    Resp: 18 18  (!) 23  Temp: 98.2 F (36.8 C) 98 F (36.7 C)  98.1 F (36.7 C)  TempSrc: Oral Oral  Temporal  SpO2: 99% 100% 98% 100%  Weight:    67.9 kg  Height:    5\' 4"  (1.626 m)    Intake/Output Summary (Last 24 hours) at 05/19/2024 1202 Last data filed at 05/19/2024 0000 Gross per 24 hour  Intake 480 ml  Output --  Net 480 ml   Filed Weights   05/18/24 3244 05/18/24 1246 05/19/24 1112  Weight: 68.5 kg 67.9 kg 67.9 kg    Scheduled Meds:  [MAR Hold] amLODipine   2.5 mg Oral q AM   [MAR Hold] bisoprolol   2.5 mg Oral Daily   [MAR Hold] ferrous sulfate   325 mg Oral Q breakfast   [MAR Hold] insulin  aspart  0-15 Units Subcutaneous TID WC   [MAR Hold] insulin  aspart  0-5  Units Subcutaneous QHS   [MAR Hold] loratadine   10 mg Oral Daily   [MAR Hold] pantoprazole   40 mg Oral BID AC   [MAR Hold] polyethylene glycol  17 g Oral Daily   [MAR Hold] pravastatin   80 mg Oral QHS   [MAR Hold] predniSONE   10 mg Oral Q breakfast   [MAR Hold] umeclidinium-vilanterol  1 puff Inhalation Daily   Continuous Infusions:  sodium chloride  500 mL (05/19/24 1122)    Nutritional status     Body mass index is 25.69 kg/m.  Data Reviewed:   CBC: Recent Labs  Lab 05/14/24 1844 05/18/24 0809 05/19/24 0558  WBC 6.1 6.4 5.8  NEUTROABS 5.3 4.6  --   HGB 11.7* 10.6* 11.0*  HCT 39.1 34.3* 36.6  MCV 88.7 89.1 89.5  PLT  334 273 304   Basic Metabolic Panel: Recent Labs  Lab 05/14/24 1844 05/18/24 0809 05/19/24 0558  NA 136 134* 136  K 4.2 3.4* 3.8  CL 103 100 104  CO2 23 24 24   GLUCOSE 340* 130* 137*  BUN 20 30* 24*  CREATININE 0.74 0.57 0.85  CALCIUM 8.9 9.4 9.1   GFR: Estimated Creatinine Clearance: 55.8 mL/min (by C-G formula based on SCr of 0.85 mg/dL). Liver Function Tests: Recent Labs  Lab 05/14/24 1844 05/19/24 0558  AST 15 11*  ALT 7 6  ALKPHOS 65 57  BILITOT 0.5 0.4  PROT 8.0 7.2  ALBUMIN 3.5 3.0*   No results for input(s): "LIPASE", "AMYLASE" in the last 168 hours. No results for input(s): "AMMONIA" in the last 168 hours. Coagulation Profile: Recent Labs  Lab 05/18/24 0809  INR 0.9   Cardiac Enzymes: No results for input(s): "CKTOTAL", "CKMB", "CKMBINDEX", "TROPONINI" in the last 168 hours. BNP (last 3 results) No results for input(s): "PROBNP" in the last 8760 hours. HbA1C: No results for input(s): "HGBA1C" in the last 72 hours. CBG: Recent Labs  Lab 05/18/24 1350 05/18/24 1558 05/18/24 2132 05/19/24 0807 05/19/24 1127  GLUCAP 224* 210* 226* 146* 117*   Lipid Profile: No results for input(s): "CHOL", "HDL", "LDLCALC", "TRIG", "CHOLHDL", "LDLDIRECT" in the last 72 hours. Thyroid  Function Tests: No results for input(s): "TSH", "T4TOTAL", "FREET4", "T3FREE", "THYROIDAB" in the last 72 hours. Anemia Panel: No results for input(s): "VITAMINB12", "FOLATE", "FERRITIN", "TIBC", "IRON", "RETICCTPCT" in the last 72 hours. Sepsis Labs: No results for input(s): "PROCALCITON", "LATICACIDVEN" in the last 168 hours.  No results found for this or any previous visit (from the past 240 hours).       Radiology Studies: DG Chest Port 1 View Result Date: 05/18/2024 CLINICAL DATA:  Hemoptysis. EXAM: PORTABLE CHEST 1 VIEW COMPARISON:  05/14/2024 FINDINGS: The cardio pericardial silhouette is enlarged. Low lung volumes with persistent right base interstitial and airspace  disease, potentially scarring from prior therapy for lung cancer. Interstitial markings are diffusely coarsened with chronic features. No acute bony abnormality. IMPRESSION: Stable exam. Low lung volumes with persistent right base interstitial and airspace disease, potentially scarring from prior therapy for lung cancer. Electronically Signed   By: Donnal Fusi M.D.   On: 05/18/2024 07:49           LOS: 0 days   Time spent= 35 mins    Maggie Schooner, MD [facility]  If 7PM-7AM, please contact night-coverage  05/19/2024, 12:02 PM

## 2024-05-19 NOTE — Op Note (Addendum)
  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 The risks of the procedure including coughing, bleeding and the small chance of lung puncture requiring chest tube were discussed in great detail. The benefits & alternatives including serial follow up were also discussed.  Anesthesia:  General endotracheal.  Procedure summary:  Appropriate equipment was assembled.  The patient was  identified as Lindsey Williamson. Interim history obtained and brought to the endo room. Safety timeout was performed. The patient was placed supine on the operating table, airway established and general anesthesia administered by Anesthesia team.   After the appropriate level of anesthesia was assured, flexible video bronchoscope was lubricated and inserted through the endotracheal tube.    Airway examination was performed bilaterally to subsegmental level.  Minimal clear secretions were noted, mucosa appeared normal and endobronchial lesion was identified in the bronchus intermedius extending into RLL. Bleeding site identified within this lesion-minimal active bleeding noted once overlying clot washed off. BAL obtained. The lesion was washed with cold saline & then again with epinephrine  solution.  After ensuring hemostasis , the bronchoscope was withdrawn.  The patient was extubated in endo room and transferred to PACU.   Specimens sent: Bronchial alveolar lavage specimen of the RLL for  microbiology and cytology.  Complications:  No immediate complications were noted.  Hemodynamic parameters and oxygenation remained stable throughout the procedure.  Estimated blood loss:  Less then 5 mL.  See pics in media tab  she has cancer  progression & endobronchial lesion on RT  is bleeding/ oozing. Will continue likely for a few days. Advise cough suppression with codeine x 1-2 weeks, return to ED if severe hemoptysis & we will have to consider bronchial artery embolisation    Celene Coins MD. FCCP. [facility] Pager [phone] If no response call [phone]   05/19/2024 12:36 PM

## 2024-05-19 NOTE — Anesthesia Procedure Notes (Signed)
 Procedure Name: Intubation Date/Time: 05/19/2024 12:16 PM  Performed by: Virgil Griffiths, CRNAPre-anesthesia Checklist: Patient identified, Emergency Drugs available, Suction available and Patient being monitored Patient Re-evaluated:Patient Re-evaluated prior to induction Oxygen Delivery Method: Circle System Utilized Preoxygenation: Pre-oxygenation with 100% oxygen Induction Type: IV induction Ventilation: Mask ventilation without difficulty Laryngoscope Size: Glidescope and 3 (multiple loose teeth) Grade View: Grade I Tube type: Oral Tube size: 8.5 mm Number of attempts: 1 Airway Equipment and Method: Stylet and Oral airway Placement Confirmation: ETT inserted through vocal cords under direct vision, positive ETCO2 and breath sounds checked- equal and bilateral Secured at: 23 cm Tube secured with: Tape Dental Injury: Teeth and Oropharynx as per pre-operative assessment

## 2024-05-19 NOTE — Consult Note (Signed)
 NAME:  Lindsey Williamson, MRN:  [id number], DOB:  04-17-1951, LOS: 0 ADMISSION DATE:  05/18/2024, CONSULTATION DATE:  05/18/24 REFERRING MD:  dr. Lewis Red - EDP, CHIEF COMPLAINT: Hemoptysis  History of Present Illness:  Lindsey Williamson is a 73 year old female with a past medical history significant for recurrent right upper lobe stage Ia non-small cell lung cancer s/p radiation intolerant to immunotherapy due to development of pneumonitis, COPD, chronic hypoxic respiratory failure on 2 to 3 L nasal cannula at baseline, HTN, former smoker, and diabetes presented to the ED at [facility] on 5/23 with complaints of hemoptysis.  Patient states hemoptysis began at night prior to admission and is described as bright in color with no underlying mucus.  Patient additionally reports that she was seen in the ED at this facility 5/19 for same problem, CTA chest stable at that time.  No signs on admission within normal limits apart from mild hypertension.  Lab work significant for sodium 134, potassium 3.4, hemoglobin 10.6 down trended from 11.7 on 5/19.  PCCM consult for further assistance in management.  Pertinent  Medical History  Recurrent right upper lobe stage Ia non-small cell lung cancer s/p radiation intolerant to immunotherapy due to development of pneumonitis, COPD, chronic hypoxic respiratory failure on 2 to 3 L nasal cannula at baseline, HTN, former smoker, and diabetes   Significant Hospital Events: Including procedures, antibiotic start and stop dates in addition to other pertinent events   5/23 presented for recurrent hemoptysis  Interim History / Subjective:   1 episode of blood-tinged sputum overnight Breathing is okay, denies pain  Objective    Blood pressure (!) 152/92, pulse 83, temperature 98 F (36.7 C), temperature source Oral, resp. rate 18, height 5\' 4"  (1.626 m), weight 67.9 kg, SpO2 98%.    FiO2 (%):  [32 %] 32 %   Intake/Output Summary (Last 24 hours) at 05/19/2024  0919 Last data filed at 05/19/2024 0000 Gross per 24 hour  Intake 480 ml  Output --  Net 480 ml   Filed Weights   05/18/24 6962 05/18/24 1246  Weight: 68.5 kg 67.9 kg    Examination: General: Elderly woman sitting in bed, no distress HEENT: [place]/AT, MM pink/moist, PERRL,  Neuro: Alert and oriented x 3, nonfocal CV: s1s2 regular rate and rhythm, no murmur, rubs, or gallops,  PULM: Decreased right base, no accessory muscle use GI: soft, nontender Extremities: warm/dry, no oral diet edema  Skin: no rashes or lesions   Resolved problem list   Assessment and Plan  Hemoptysis in the setting of known metastatic lung cancer  Recurrent right upper lobe stage IIIa non-small cell lung cancer s/p chemo/radiation intolerant to immunotherapy due to development of pneumonitis  COPD, former smoker Chronic hypoxic respiratory failure on 2 to 3 L nasal cannula at baseline P: Plan for inspection bronchoscopy today, this is likely arising from right lower lobe where she had an endobronchial lesion in the past and is still noted on her last CT - Cough suppression with codeine cough syrup - Use tranexamic acid  nebs only as needed  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 She has improved, prednisone  can be tapered to 5 mg with plan to discontinue in 2 weeks. Bactrim  can be discontinued   She indicated that after discussion with her husband she would like to reverse DNR order.  She would like to discuss with Dr. Marguerita Shih  about further options for her cancer.  I have reverted CODE STATUS to full code  Best Practice (right click and "Reselect all SmartList Selections" daily)  Per primary   Labs   CBC: Recent Labs  Lab 05/14/24 1844 05/18/24 0809 05/19/24 0558  WBC 6.1 6.4 5.8  NEUTROABS 5.3 4.6  --   HGB 11.7* 10.6* 11.0*  HCT 39.1 34.3* 36.6   MCV 88.7 89.1 89.5  PLT 334 273 304    Basic Metabolic Panel: Recent Labs  Lab 05/14/24 1844 05/18/24 0809 05/19/24 0558  NA 136 134* 136  K 4.2 3.4* 3.8  CL 103 100 104  CO2 23 24 24   GLUCOSE 340* 130* 137*  BUN 20 30* 24*  CREATININE 0.74 0.57 0.85  CALCIUM 8.9 9.4 9.1   GFR: Estimated Creatinine Clearance: 55.8 mL/min (by C-G formula based on SCr of 0.85 mg/dL). Recent Labs  Lab 05/14/24 1844 05/18/24 0809 05/19/24 0558  WBC 6.1 6.4 5.8    Liver Function Tests: Recent Labs  Lab 05/14/24 1844 05/19/24 0558  AST 15 11*  ALT 7 6  ALKPHOS 65 57  BILITOT 0.5 0.4  PROT 8.0 7.2  ALBUMIN 3.5 3.0*   No results for input(s): "LIPASE", "AMYLASE" in the last 168 hours. No results for input(s): "AMMONIA" in the last 168 hours.  ABG No results found for: "PHART", "PCO2ART", "PO2ART", "HCO3", "TCO2", "ACIDBASEDEF", "O2SAT"   Coagulation Profile: Recent Labs  Lab 05/18/24 0809  INR 0.9    Cardiac Enzymes: No results for input(s): "CKTOTAL", "CKMB", "CKMBINDEX", "TROPONINI" in the last 168 hours.  HbA1C: Hgb A1c MFr Bld  Date/Time Value Ref Range Status  02/16/2024 05:59 AM 7.4 (H) 4.8 - 5.6 % Final    Comment:    (NOTE) Pre diabetes:          5.7%-6.4%  Diabetes:              >6.4%  Glycemic control for   <7.0% adults with diabetes   10/17/2023 11:27 PM 8.3 (H) 4.8 - 5.6 % Final    Comment:    (NOTE) Pre diabetes:          5.7%-6.4%  Diabetes:              >6.4%  Glycemic control for   <7.0% adults with diabetes     CBG: Recent Labs  Lab 05/18/24 1350 05/18/24 1558 05/18/24 2132 05/19/24 0807  GLUCAP 224* 210* 226* 146*    Celene Coins MD. FCCP. [facility] Pager : [phone]  If no response to pager , please call [phone] until 7 pm After 7:00 pm call Elink  [phone]   05/19/2024 05/19/2024, 9:19 AM

## 2024-05-19 NOTE — Transfer of Care (Signed)
 Immediate Anesthesia Transfer of Care Note  Patient: Lindsey Williamson  Procedure(s) Performed: VIDEO BRONCHOSCOPY WITHOUT FLUORO (Bilateral)  Patient Location: PACU  Anesthesia Type:General  Level of Consciousness: drowsy and patient cooperative  Airway & Oxygen Therapy: Patient Spontanous Breathing and Patient connected to face mask oxygen  Post-op Assessment: Report given to RN and Post -op Vital signs reviewed and stable  Post vital signs: Reviewed and stable  Last Vitals:  Vitals Value Taken Time  BP 164/94 05/19/24 1243  Temp    Pulse 82 05/19/24 1249  Resp 20 05/19/24 1249  SpO2 98 % 05/19/24 1249  Vitals shown include unfiled device data.  Last Pain:  Vitals:   05/19/24 1112  TempSrc: Temporal  PainSc: 0-No pain      Patients Stated Pain Goal: 0 (05/19/24 1112)  Complications: No notable events documented.

## 2024-05-19 NOTE — Interval H&P Note (Signed)
 History and Physical Interval Note:  05/19/2024 12:11 PM  Lindsey Williamson  has presented today for surgery, with the diagnosis of hemoptysis.  The various methods of treatment have been discussed with the patient and family. After consideration of risks, benefits and other options for treatment, the patient has consented to  Procedure(s): VIDEO BRONCHOSCOPY WITHOUT FLUORO (Bilateral) as a surgical intervention.  The patient's history has been reviewed, patient examined, no change in status, stable for surgery.  I have reviewed the patient's chart and labs.  Questions were answered to the patient's satisfaction.     Jennifer Moellers [name]

## 2024-05-19 NOTE — Plan of Care (Incomplete)
  Problem: Clinical Measurements: Goal: Diagnostic test results will improve Outcome: Progressing   Problem: Education: Goal: Knowledge of General Education information will improve Description: Including pain rating scale, medication(s)/side effects and non-pharmacologic comfort measures Outcome: Adequate for Discharge   Problem: Health Behavior/Discharge Planning: Goal: Ability to manage health-related needs will improve Outcome: Adequate for Discharge   Problem: Clinical Measurements: Goal: Ability to maintain clinical measurements within normal limits will improve Outcome: Adequate for Discharge Goal: Will remain free from infection Outcome: Adequate for Discharge Goal: Respiratory complications will improve Outcome: Adequate for Discharge Goal: Cardiovascular complication will be avoided Outcome: Adequate for Discharge   Problem: Activity: Goal: Risk for activity intolerance will decrease Outcome: Adequate for Discharge   Problem: Nutrition: Goal: Adequate nutrition will be maintained Outcome: Adequate for Discharge   Problem: Coping: Goal: Level of anxiety will decrease Outcome: Adequate for Discharge   Problem: Elimination: Goal: Will not experience complications related to bowel motility Outcome: Adequate for Discharge Goal: Will not experience complications related to urinary retention Outcome: Adequate for Discharge

## 2024-05-19 NOTE — H&P (View-Only) (Signed)
 NAME:  Lindsey Williamson, MRN:  [id number], DOB:  04-17-1951, LOS: 0 ADMISSION DATE:  05/18/2024, CONSULTATION DATE:  05/18/24 REFERRING MD:  dr. Lewis Red - EDP, CHIEF COMPLAINT: Hemoptysis  History of Present Illness:  Lindsey Williamson is a 73 year old female with a past medical history significant for recurrent right upper lobe stage Ia non-small cell lung cancer s/p radiation intolerant to immunotherapy due to development of pneumonitis, COPD, chronic hypoxic respiratory failure on 2 to 3 L nasal cannula at baseline, HTN, former smoker, and diabetes presented to the ED at [facility] on 5/23 with complaints of hemoptysis.  Patient states hemoptysis began at night prior to admission and is described as bright in color with no underlying mucus.  Patient additionally reports that she was seen in the ED at this facility 5/19 for same problem, CTA chest stable at that time.  No signs on admission within normal limits apart from mild hypertension.  Lab work significant for sodium 134, potassium 3.4, hemoglobin 10.6 down trended from 11.7 on 5/19.  PCCM consult for further assistance in management.  Pertinent  Medical History  Recurrent right upper lobe stage Ia non-small cell lung cancer s/p radiation intolerant to immunotherapy due to development of pneumonitis, COPD, chronic hypoxic respiratory failure on 2 to 3 L nasal cannula at baseline, HTN, former smoker, and diabetes   Significant Hospital Events: Including procedures, antibiotic start and stop dates in addition to other pertinent events   5/23 presented for recurrent hemoptysis  Interim History / Subjective:   1 episode of blood-tinged sputum overnight Breathing is okay, denies pain  Objective    Blood pressure (!) 152/92, pulse 83, temperature 98 F (36.7 C), temperature source Oral, resp. rate 18, height 5\' 4"  (1.626 m), weight 67.9 kg, SpO2 98%.    FiO2 (%):  [32 %] 32 %   Intake/Output Summary (Last 24 hours) at 05/19/2024  0919 Last data filed at 05/19/2024 0000 Gross per 24 hour  Intake 480 ml  Output --  Net 480 ml   Filed Weights   05/18/24 6962 05/18/24 1246  Weight: 68.5 kg 67.9 kg    Examination: General: Elderly woman sitting in bed, no distress HEENT: [place]/AT, MM pink/moist, PERRL,  Neuro: Alert and oriented x 3, nonfocal CV: s1s2 regular rate and rhythm, no murmur, rubs, or gallops,  PULM: Decreased right base, no accessory muscle use GI: soft, nontender Extremities: warm/dry, no oral diet edema  Skin: no rashes or lesions   Resolved problem list   Assessment and Plan  Hemoptysis in the setting of known metastatic lung cancer  Recurrent right upper lobe stage IIIa non-small cell lung cancer s/p chemo/radiation intolerant to immunotherapy due to development of pneumonitis  COPD, former smoker Chronic hypoxic respiratory failure on 2 to 3 L nasal cannula at baseline P: Plan for inspection bronchoscopy today, this is likely arising from right lower lobe where she had an endobronchial lesion in the past and is still noted on her last CT - Cough suppression with codeine cough syrup - Use tranexamic acid  nebs only as needed  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 She has improved, prednisone  can be tapered to 5 mg with plan to discontinue in 2 weeks. Bactrim  can be discontinued   She indicated that after discussion with her husband she would like to reverse DNR order.  She would like to discuss with Dr. Marguerita Shih  about further options for her cancer.  I have reverted CODE STATUS to full code  Best Practice (right click and "Reselect all SmartList Selections" daily)  Per primary   Labs   CBC: Recent Labs  Lab 05/14/24 1844 05/18/24 0809 05/19/24 0558  WBC 6.1 6.4 5.8  NEUTROABS 5.3 4.6  --   HGB 11.7* 10.6* 11.0*  HCT 39.1 34.3* 36.6   MCV 88.7 89.1 89.5  PLT 334 273 304    Basic Metabolic Panel: Recent Labs  Lab 05/14/24 1844 05/18/24 0809 05/19/24 0558  NA 136 134* 136  K 4.2 3.4* 3.8  CL 103 100 104  CO2 23 24 24   GLUCOSE 340* 130* 137*  BUN 20 30* 24*  CREATININE 0.74 0.57 0.85  CALCIUM 8.9 9.4 9.1   GFR: Estimated Creatinine Clearance: 55.8 mL/min (by C-G formula based on SCr of 0.85 mg/dL). Recent Labs  Lab 05/14/24 1844 05/18/24 0809 05/19/24 0558  WBC 6.1 6.4 5.8    Liver Function Tests: Recent Labs  Lab 05/14/24 1844 05/19/24 0558  AST 15 11*  ALT 7 6  ALKPHOS 65 57  BILITOT 0.5 0.4  PROT 8.0 7.2  ALBUMIN 3.5 3.0*   No results for input(s): "LIPASE", "AMYLASE" in the last 168 hours. No results for input(s): "AMMONIA" in the last 168 hours.  ABG No results found for: "PHART", "PCO2ART", "PO2ART", "HCO3", "TCO2", "ACIDBASEDEF", "O2SAT"   Coagulation Profile: Recent Labs  Lab 05/18/24 0809  INR 0.9    Cardiac Enzymes: No results for input(s): "CKTOTAL", "CKMB", "CKMBINDEX", "TROPONINI" in the last 168 hours.  HbA1C: Hgb A1c MFr Bld  Date/Time Value Ref Range Status  02/16/2024 05:59 AM 7.4 (H) 4.8 - 5.6 % Final    Comment:    (NOTE) Pre diabetes:          5.7%-6.4%  Diabetes:              >6.4%  Glycemic control for   <7.0% adults with diabetes   10/17/2023 11:27 PM 8.3 (H) 4.8 - 5.6 % Final    Comment:    (NOTE) Pre diabetes:          5.7%-6.4%  Diabetes:              >6.4%  Glycemic control for   <7.0% adults with diabetes     CBG: Recent Labs  Lab 05/18/24 1350 05/18/24 1558 05/18/24 2132 05/19/24 0807  GLUCAP 224* 210* 226* 146*    Celene Coins MD. FCCP. [facility] Pager : [phone]  If no response to pager , please call [phone] until 7 pm After 7:00 pm call Elink  [phone]   05/19/2024 05/19/2024, 9:19 AM

## 2024-05-20 DIAGNOSIS — E871 Hypo-osmolality and hyponatremia: Secondary | ICD-10-CM | POA: Diagnosis present

## 2024-05-20 DIAGNOSIS — J9611 Chronic respiratory failure with hypoxia: Secondary | ICD-10-CM | POA: Diagnosis present

## 2024-05-20 DIAGNOSIS — I10 Essential (primary) hypertension: Secondary | ICD-10-CM | POA: Diagnosis present

## 2024-05-20 DIAGNOSIS — C342 Malignant neoplasm of middle lobe, bronchus or lung: Secondary | ICD-10-CM | POA: Diagnosis present

## 2024-05-20 DIAGNOSIS — C771 Secondary and unspecified malignant neoplasm of intrathoracic lymph nodes: Secondary | ICD-10-CM | POA: Diagnosis present

## 2024-05-20 DIAGNOSIS — C7801 Secondary malignant neoplasm of right lung: Secondary | ICD-10-CM | POA: Diagnosis present

## 2024-05-20 DIAGNOSIS — J849 Interstitial pulmonary disease, unspecified: Secondary | ICD-10-CM | POA: Diagnosis present

## 2024-05-20 DIAGNOSIS — R042 Hemoptysis: Secondary | ICD-10-CM | POA: Diagnosis present

## 2024-05-20 DIAGNOSIS — E876 Hypokalemia: Secondary | ICD-10-CM | POA: Diagnosis present

## 2024-05-20 DIAGNOSIS — Z888 Allergy status to other drugs, medicaments and biological substances status: Secondary | ICD-10-CM | POA: Diagnosis not present

## 2024-05-20 DIAGNOSIS — Z87891 Personal history of nicotine dependence: Secondary | ICD-10-CM | POA: Diagnosis not present

## 2024-05-20 DIAGNOSIS — Z951 Presence of aortocoronary bypass graft: Secondary | ICD-10-CM | POA: Diagnosis not present

## 2024-05-20 DIAGNOSIS — Z794 Long term (current) use of insulin: Secondary | ICD-10-CM | POA: Diagnosis not present

## 2024-05-20 DIAGNOSIS — E785 Hyperlipidemia, unspecified: Secondary | ICD-10-CM | POA: Diagnosis present

## 2024-05-20 DIAGNOSIS — Z9221 Personal history of antineoplastic chemotherapy: Secondary | ICD-10-CM | POA: Diagnosis not present

## 2024-05-20 DIAGNOSIS — Z7984 Long term (current) use of oral hypoglycemic drugs: Secondary | ICD-10-CM | POA: Diagnosis not present

## 2024-05-20 DIAGNOSIS — D630 Anemia in neoplastic disease: Secondary | ICD-10-CM | POA: Diagnosis present

## 2024-05-20 DIAGNOSIS — Z7982 Long term (current) use of aspirin: Secondary | ICD-10-CM | POA: Diagnosis not present

## 2024-05-20 DIAGNOSIS — Z885 Allergy status to narcotic agent status: Secondary | ICD-10-CM | POA: Diagnosis not present

## 2024-05-20 DIAGNOSIS — Z923 Personal history of irradiation: Secondary | ICD-10-CM | POA: Diagnosis not present

## 2024-05-20 DIAGNOSIS — J449 Chronic obstructive pulmonary disease, unspecified: Secondary | ICD-10-CM | POA: Diagnosis present

## 2024-05-20 DIAGNOSIS — I2510 Atherosclerotic heart disease of native coronary artery without angina pectoris: Secondary | ICD-10-CM | POA: Diagnosis present

## 2024-05-20 DIAGNOSIS — Z9981 Dependence on supplemental oxygen: Secondary | ICD-10-CM | POA: Diagnosis not present

## 2024-05-20 DIAGNOSIS — E1165 Type 2 diabetes mellitus with hyperglycemia: Secondary | ICD-10-CM | POA: Diagnosis present

## 2024-05-20 LAB — CBC
HCT: 36.2 % (ref 36.0–46.0)
Hemoglobin: 10.6 g/dL — ABNORMAL LOW (ref 12.0–15.0)
MCH: 26.7 pg (ref 26.0–34.0)
MCHC: 29.3 g/dL — ABNORMAL LOW (ref 30.0–36.0)
MCV: 91.2 fL (ref 80.0–100.0)
Platelets: 312 10*3/uL (ref 150–400)
RBC: 3.97 MIL/uL (ref 3.87–5.11)
RDW: 16.9 % — ABNORMAL HIGH (ref 11.5–15.5)
WBC: 6.1 10*3/uL (ref 4.0–10.5)
nRBC: 0 % (ref 0.0–0.2)

## 2024-05-20 LAB — BASIC METABOLIC PANEL WITH GFR
Anion gap: 9 (ref 5–15)
BUN: 25 mg/dL — ABNORMAL HIGH (ref 8–23)
CO2: 23 mmol/L (ref 22–32)
Calcium: 8.5 mg/dL — ABNORMAL LOW (ref 8.9–10.3)
Chloride: 103 mmol/L (ref 98–111)
Creatinine, Ser: 0.93 mg/dL (ref 0.44–1.00)
GFR, Estimated: >60 mL/min (ref >60)
Glucose, Bld: 155 mg/dL — ABNORMAL HIGH (ref 70–99)
Potassium: 4.3 mmol/L (ref 3.5–5.1)
Sodium: 135 mmol/L (ref 135–145)

## 2024-05-20 LAB — GLUCOSE, CAPILLARY: Glucose-Capillary: 189 mg/dL — ABNORMAL HIGH (ref 70–99)

## 2024-05-20 LAB — MAGNESIUM: Magnesium: 2.2 mg/dL (ref 1.7–2.4)

## 2024-05-20 LAB — PHOSPHORUS: Phosphorus: 2.5 mg/dL (ref 2.5–4.6)

## 2024-05-20 MED ORDER — GUAIFENESIN-CODEINE 100-10 MG/5ML PO SOLN: 5.0000 mL | ORAL | 0 refills | Status: DC | PRN

## 2024-05-20 MED ORDER — GLIPIZIDE ER 5 MG PO TB24
5.0000 mg | ORAL_TABLET | Freq: Every day | ORAL | Status: DC
  Administered 2024-05-20: 5 mg via ORAL
  Filled 2024-05-20: qty 1

## 2024-05-20 MED ORDER — PROMETHAZINE-DM 6.25-15 MG/5ML PO SYRP
5.0000 mL | ORAL_SOLUTION | Freq: Four times a day (QID) | ORAL | 0 refills | Status: DC | PRN
Start: 2024-05-20 — End: 2024-07-03

## 2024-05-20 MED ORDER — EMPAGLIFLOZIN 25 MG PO TABS
25.0000 mg | ORAL_TABLET | Freq: Every day | ORAL | Status: DC
  Administered 2024-05-20: 25 mg via ORAL
  Filled 2024-05-20: qty 1

## 2024-05-20 MED ORDER — PREDNISONE 10 MG PO TABS: ORAL_TABLET | ORAL | 0 refills | Status: AC

## 2024-05-20 NOTE — Anesthesia Postprocedure Evaluation (Signed)
 Anesthesia Post Note  Patient: Lindsey Williamson  Procedure(s) Performed: VIDEO BRONCHOSCOPY WITHOUT FLUORO (Bilateral) IRRIGATION, BRONCHUS     Patient location during evaluation: PACU Anesthesia Type: General Level of consciousness: awake and alert Pain management: pain level controlled Vital Signs Assessment: post-procedure vital signs reviewed and stable Respiratory status: spontaneous breathing, nonlabored ventilation, respiratory function stable and patient connected to nasal cannula oxygen Cardiovascular status: blood pressure returned to baseline and stable Postop Assessment: no apparent nausea or vomiting Anesthetic complications: no   No notable events documented.  Last Vitals:  Vitals:   05/20/24 0749 05/20/24 0908  BP:  122/70  Pulse:  84  Resp:    Temp:  36.7 C  SpO2: 93% 94%    Last Pain:  Vitals:   05/20/24 0908  TempSrc: Oral  PainSc:                  Ranee Peasley

## 2024-05-20 NOTE — TOC Transition Note (Signed)
 Transition of Care [facility]) - Discharge Note   Patient Details  Name: Lindsey Williamson MRN: [id number] Date of Birth: 02-27-1951  Transition of [facility]) CM/SW Contact:  Levie Ream, RN Phone Number: 05/20/2024, 9:11 AM   Clinical Narrative:    D/C orders received; pt has home oxygen w/ [employer], pt has travel tank; no TOC needs.   Final next level of care: Home/Self Care Barriers to Discharge: No Barriers Identified   Patient Goals and CMS Choice Patient states their goals for this hospitalization and ongoing recovery are:: Home CMS Medicare.gov Compare Post Acute Care list provided to:: Patient Choice offered to / list presented to : Patient [place] ownership interest in [facility].provided to:: Patient    Discharge Placement                       Discharge Plan and Services Additional resources added to the After Visit Summary for     Discharge Planning Services: CM Consult Post Acute Care Choice: Resumption of Svcs/PTA Provider                               Social Drivers of Health (SDOH) Interventions SDOH Screenings   Food Insecurity: No Food Insecurity (05/18/2024)  Housing: Low Risk  (05/18/2024)  Transportation Needs: No Transportation Needs (05/18/2024)  Utilities: Not At Risk (05/18/2024)  Financial Resource Strain: Low Risk  (04/24/2024)   Received from [facility]  Physical Activity: Inactive (04/24/2024)   Received from [facility]  Social Connections: Socially Integrated (05/18/2024)  Stress: Stress Concern Present (04/24/2024)   Received from [facility]  Tobacco Use: Medium Risk (05/18/2024)     Readmission Risk Interventions    02/18/2024    9:51 AM  Readmission Risk Prevention Plan  Transportation Screening Complete  PCP or Specialist Appt within 3-5 Days Complete  HRI or Home Care Consult Complete  Social Work Consult for Recovery Care Planning/Counseling Complete  Palliative Care Screening Not  Applicable  Medication Review [employer]) Complete

## 2024-05-20 NOTE — Progress Notes (Signed)
 PROGRESS NOTE    Lindsey Williamson  JYN:[id number] DOB: [birth year] DOA: 05/18/2024 PCP: Claudell Cruz, MD     Brief Narrative:  73 year old with history of COPD, ILD 3 L nasal cannula, recurrent NSCLC with local mets and mets to LN, CAD status post CABG, HTN, DM2 presented with hemoptysis.  Recently discharged after being treated for COPD flare with prolonged taper steroids.  During bronchoscopy on 5/24, endobronchial lesion noted which appears to be high risk for bleeding.  Today she is doing significantly well.  She has been advised to take cough syrup and follow-up outpatient.  She is allergic to codeine therefore advised trialing different over-the-counter schedule cough syrup. Husband updated as well Medically stable for discharge.   Assessment & Plan:  Principal Problem:   Hemoptysis Active Problems:   Hypertension   Type 2 diabetes mellitus with microalbuminuria (HCC)   ILD (interstitial lung disease) (HCC)   COPD (chronic obstructive pulmonary disease) (HCC)   Malignant neoplasm of lower lobe of right lung (HCC)   Hemoptysis likely in setting of metastatic lung cancer Recurrent right upper lobe NSCLC s/p radiation - During bronchoscopy on 5/24, endobronchial lesion noted which appears to be high risk for bleeding.  Today she is doing significantly well.  She has been advised to take cough syrup and follow-up outpatient.  She is allergic to codeine therefore advised trialing different over-the-counter schedule cough syrup. -Taper off prednisone  over next 2 weeks  History of COPD with chronic hypoxia 2 L nasal cannula ILD - Continue home daily prednisone  and bronchodilators.  Taper prednisone  over next 2 weeks  Diabetes mellitus type 2, hyperglycemia - Resume glipizide  and Jardiance.  Sliding scale and Accu-Cheks  Essential hypertension - Amlodipine  and bisoprolol .  Hypokalemia - As needed repletion  Anemia of chronic disease - On iron  supplements  Hyperlipidemia - Statin    DVT prophylaxis: SCDs Start: 05/18/24 1319    Code Status: Full Code Family Communication: Been updated discharge today   Subjective: Feel well wishing to go home.  Husband updated Examination:  General exam: Appears calm and comfortable  Respiratory system: Clear to auscultation. Respiratory effort normal. Cardiovascular system: S1 & S2 heard, RRR. No JVD, murmurs, rubs, gallops or clicks. No pedal edema. Gastrointestinal system: Abdomen is nondistended, soft and nontender. No organomegaly or masses felt. Normal bowel sounds heard. Central nervous system: Alert and oriented. No focal neurological deficits. Extremities: Symmetric 5 x 5 power. Skin: No rashes, lesions or ulcers Psychiatry: Judgement and insight appear normal. Mood & affect appropriate.                Diet Orders (From admission, onward)     Start     Ordered   05/19/24 1337  Diet Carb Modified Fluid consistency: Thin; Room service appropriate? Yes  Diet effective now       Question Answer Comment  Diet-HS Snack? Nothing   Calorie Level Medium 1600-2000   Fluid consistency: Thin   Room service appropriate? Yes      05/19/24 1336            Objective: Vitals:   05/19/24 1949 05/20/24 0449 05/20/24 0749 05/20/24 0908  BP: 129/76 127/73  122/70  Pulse: 85 84  84  Resp: 20     Temp: 98 F (36.7 C) 97.9 F (36.6 C)  98 F (36.7 C)  TempSrc: Oral Oral  Oral  SpO2: 99% 98% 93% 94%  Weight:      Height:  Intake/Output Summary (Last 24 hours) at 05/20/2024 0925 Last data filed at 05/19/2024 2120 Gross per 24 hour  Intake 440 ml  Output --  Net 440 ml   Filed Weights   05/18/24 1610 05/18/24 1246 05/19/24 1112  Weight: 68.5 kg 67.9 kg 67.9 kg    Scheduled Meds:  amLODipine   2.5 mg Oral q AM   bisoprolol   2.5 mg Oral Daily   empagliflozin  25 mg Oral Daily   ferrous sulfate   325 mg Oral Q breakfast   glipiZIDE   5 mg Oral Q breakfast    insulin  aspart  0-15 Units Subcutaneous TID WC   insulin  aspart  0-5 Units Subcutaneous QHS   loratadine   10 mg Oral Daily   pantoprazole   40 mg Oral BID AC   polyethylene glycol  17 g Oral Daily   pravastatin   80 mg Oral QHS   predniSONE   10 mg Oral Q breakfast   umeclidinium-vilanterol  1 puff Inhalation Daily   Continuous Infusions:  Nutritional status     Body mass index is 25.69 kg/m.  Data Reviewed:   CBC: Recent Labs  Lab 05/14/24 1844 05/18/24 0809 05/19/24 0558 05/20/24 0515  WBC 6.1 6.4 5.8 6.1  NEUTROABS 5.3 4.6  --   --   HGB 11.7* 10.6* 11.0* 10.6*  HCT 39.1 34.3* 36.6 36.2  MCV 88.7 89.1 89.5 91.2  PLT 334 273 304 312   Basic Metabolic Panel: Recent Labs  Lab 05/14/24 1844 05/18/24 0809 05/19/24 0558 05/20/24 0515  NA 136 134* 136 135  K 4.2 3.4* 3.8 4.3  CL 103 100 104 103  CO2 23 24 24 23   GLUCOSE 340* 130* 137* 155*  BUN 20 30* 24* 25*  CREATININE 0.74 0.57 0.85 0.93  CALCIUM 8.9 9.4 9.1 8.5*  MG  --   --   --  2.2  PHOS  --   --   --  2.5   GFR: Estimated Creatinine Clearance: 51 mL/min (by C-G formula based on SCr of 0.93 mg/dL). Liver Function Tests: Recent Labs  Lab 05/14/24 1844 05/19/24 0558  AST 15 11*  ALT 7 6  ALKPHOS 65 57  BILITOT 0.5 0.4  PROT 8.0 7.2  ALBUMIN 3.5 3.0*   No results for input(s): "LIPASE", "AMYLASE" in the last 168 hours. No results for input(s): "AMMONIA" in the last 168 hours. Coagulation Profile: Recent Labs  Lab 05/18/24 0809  INR 0.9   Cardiac Enzymes: No results for input(s): "CKTOTAL", "CKMB", "CKMBINDEX", "TROPONINI" in the last 168 hours. BNP (last 3 results) No results for input(s): "PROBNP" in the last 8760 hours. HbA1C: No results for input(s): "HGBA1C" in the last 72 hours. CBG: Recent Labs  Lab 05/19/24 1620 05/19/24 1841 05/19/24 2040 05/19/24 2212 05/20/24 0804  GLUCAP 446* 448* 231* 262* 189*   Lipid Profile: No results for input(s): "CHOL", "HDL", "LDLCALC",  "TRIG", "CHOLHDL", "LDLDIRECT" in the last 72 hours. Thyroid  Function Tests: No results for input(s): "TSH", "T4TOTAL", "FREET4", "T3FREE", "THYROIDAB" in the last 72 hours. Anemia Panel: No results for input(s): "VITAMINB12", "FOLATE", "FERRITIN", "TIBC", "IRON", "RETICCTPCT" in the last 72 hours. Sepsis Labs: No results for input(s): "PROCALCITON", "LATICACIDVEN" in the last 168 hours.  No results found for this or any previous visit (from the past 240 hours).       Radiology Studies: No results found.         LOS: 0 days   Time spent= 35 mins    Maggie Schooner, MD [facility]  If 7PM-7AM, please contact night-coverage  05/20/2024, 9:25 AM

## 2024-05-20 NOTE — Discharge Summary (Signed)
 Physician Discharge Summary  Lindsey Williamson FAO:[id number] DOB: 1951-01-10 DOA: 05/18/2024  PCP: Claudell Cruz, MD  Admit date: 05/18/2024 Discharge date: 05/20/2024  Admitted From: Home Disposition: Home  Recommendations for Outpatient Follow-up:  Follow up with PCP in 1-2 weeks Please obtain BMP/CBC in one week your next doctors visit.  Prednisone  taper over 2 weeks Schedule OTC cough medicine.  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  Recently discharged after being treated for COPD flare with prolonged taper steroids.  During bronchoscopy on 5/24, endobronchial lesion noted which appears to be high risk for bleeding.  Today she is doing significantly well.  She has been advised to take cough syrup and follow-up outpatient.  She is allergic to codeine therefore advised trialing different over-the-counter schedule cough syrup. Husband updated as well Medically stable for discharge.   Assessment & Plan:  Principal Problem:   Hemoptysis Active Problems:   Hypertension   Type 2 diabetes mellitus with microalbuminuria (HCC)   ILD (interstitial lung disease) (HCC)   COPD (chronic obstructive pulmonary disease) (HCC)   Malignant neoplasm of lower lobe of right lung (HCC)   Hemoptysis likely in setting of metastatic lung cancer Recurrent right upper lobe NSCLC s/p radiation - During bronchoscopy on 5/24, endobronchial lesion noted which appears to be high risk for bleeding.  Today she is doing significantly well.  She has been advised to take cough syrup and follow-up outpatient.  She is allergic to codeine therefore advised trialing different  over-the-counter schedule cough syrup. -Taper off prednisone  over next 2 weeks  History of COPD with chronic hypoxia 2 L nasal cannula ILD - Continue home daily prednisone  and bronchodilators.  Taper prednisone  over next 2 weeks  Diabetes mellitus type 2, hyperglycemia - Resume glipizide  and Jardiance.  Sliding scale and Accu-Cheks  Essential hypertension - Amlodipine  and bisoprolol .  Hypokalemia - As needed repletion  Anemia of chronic disease - On iron supplements  Hyperlipidemia - Statin    DVT prophylaxis: SCDs Start: 05/18/24 1319    Code Status: Full Code Family Communication: Been updated discharge today   Subjective: Feel well wishing to go home.  Husband updated Examination:  General exam: Appears calm and comfortable  Respiratory system: Clear to auscultation. Respiratory effort normal. Cardiovascular system: S1 & S2 heard, RRR. No JVD, murmurs, rubs, gallops or clicks. No pedal edema. Gastrointestinal system: Abdomen is nondistended, soft and nontender. No organomegaly or masses felt. Normal bowel sounds heard. Central nervous system: Alert and oriented. No focal neurological deficits. Extremities: Symmetric 5 x 5 power. Skin: No rashes, lesions or ulcers Psychiatry: Judgement and insight appear normal. Mood & affect appropriate.    Discharge Diagnoses:  Principal Problem:   Hemoptysis Active Problems:   Hypertension   Type 2 diabetes mellitus with microalbuminuria (HCC)   ILD (interstitial lung disease) (HCC)   COPD (chronic obstructive pulmonary disease) (HCC)   Malignant neoplasm of lower lobe of right lung (HCC)   Malignant neoplasm of middle lobe of right lung (HCC)      Discharge Exam: Vitals:   05/20/24 0749 05/20/24 0908  BP:  122/70  Pulse:  84  Resp:    Temp:  98 F (36.7 C)  SpO2: 93% 94%   Vitals:   05/19/24 1949 05/20/24 0449 05/20/24 0749 05/20/24 0908  BP: 129/76 127/73  122/70  Pulse: 85 84  84  Resp: 20     Temp: 98  F (36.7 C) 97.9 F (36.6 C)  98 F (36.7 C)  TempSrc: Oral Oral  Oral  SpO2: 99% 98% 93% 94%  Weight:      Height:          Discharge Instructions   Allergies as of 05/20/2024       Reactions   Benzonatate Palpitations   Lisinopril Swelling, Other (See Comments)   Patient ended up in the ED with a swollen mouth and tongue   Aspirin  Nausea And Vomiting, Palpitations   Can tolerate baby aspirin  81 mg without difficulty but can't tolerate higher dosages   Azithromycin  Palpitations   Codeine Palpitations        Medication List     STOP taking these medications    azelastine  0.1 % nasal spray Commonly known as: ASTELIN    fluticasone  50 MCG/ACT nasal spray Commonly known as: FLONASE        TAKE these medications    albuterol  108 (90 Base) MCG/ACT inhaler Commonly known as: VENTOLIN  HFA Inhale 2 puffs into the lungs every 6 (six) hours as needed for wheezing or shortness of breath.   amLODipine  5 MG tablet Commonly known as: NORVASC  Take 5 mg by mouth in the morning.   aspirin  EC 81 MG tablet Take 81 mg by mouth daily. Swallow whole.   bisoprolol  5 MG tablet Commonly known as: ZEBETA  Take 2.5 mg by mouth daily.   cetirizine  10 MG tablet Commonly known as: ZyrTEC  Allergy  Take 1 tablet (10 mg total) by mouth daily.   diclofenac Sodium 1 % Gel Commonly known as: VOLTAREN Apply 1 Application topically daily as needed (pain).   empagliflozin 25 MG Tabs tablet Commonly known as: JARDIANCE Take 25 mg by mouth daily at 6 PM.   ferrous sulfate  325 (65 FE) MG tablet Take 325 mg by mouth daily with breakfast.   glipiZIDE  5 MG 24 hr tablet Commonly known as: GLUCOTROL  XL Take 1 tablet (5 mg total) by mouth daily with breakfast.   guaiFENesin  600 MG 12 hr tablet Commonly known as: Mucinex  Take 1 tablet (600 mg total) by mouth 2 (two) times daily.   guaiFENesin -dextromethorphan 100-10 MG/5ML syrup Commonly known as: ROBITUSSIN DM Take 5 mLs by mouth  every 4 (four) hours as needed for cough.   ipratropium-albuterol  0.5-2.5 (3) MG/3ML Soln Commonly known as: DUONEB Take 3 mLs by nebulization in the morning and at bedtime.   metFORMIN 500 MG 24 hr tablet Commonly known as: GLUCOPHAGE-XR Take 1,000 mg by mouth in the morning and at bedtime.   NovoLOG  FlexPen 100 UNIT/ML FlexPen Generic drug: insulin  aspart Inject 0-11 Units into the skin 3 (three) times daily with meals. Check Blood Glucose (BG) and inject per scale: BG <150= 0 unit; BG 150-200= 1 unit; BG 201-250= 3 unit; BG 251-300= 5 unit; BG 301-350= 7 unit; BG 351-400= 9 unit; BG >400= 11 unit and Call Primary Care. What changed:  how much to take when to take this reasons to take this additional instructions   omeprazole  20 MG capsule Commonly known as: PRILOSEC Take 20 mg by mouth daily.   PEG 3350  17 g Pack Take 17 g packet by mouth daily. What changed:  when to take this reasons to take this   pravastatin  80 MG tablet  Commonly known as: PRAVACHOL  Take 80 mg by mouth at bedtime.   predniSONE  10 MG tablet Commonly known as: DELTASONE  Take 1 tablet (10 mg total) by mouth daily with breakfast for 7 days, THEN 0.5 tablets (5 mg total) daily with breakfast for 7 days. Start taking on: May 20, 2024 What changed: See the new instructions.   sulfamethoxazole -trimethoprim  800-160 MG tablet Commonly known as: Bactrim  DS Take 1 tablet by mouth 3 (three) times a week. What changed: additional instructions   umeclidinium-vilanterol 62.5-25 MCG/ACT Aepb Commonly known as: Anoro Ellipta  Inhale 1 puff into the lungs daily.        Follow-up Information     Claudell Cruz, MD Follow up in 1 week(s).   Specialty: Family Medicine Contact information: [address] [place] [address] [phone]         [facility] Follow up.   Contact information: [facility]) [address] [place], [place] [address] [phone]                Allergies  Allergen Reactions   Benzonatate Palpitations   Lisinopril Swelling and Other (See Comments)    Patient ended up in the ED with a swollen mouth and tongue   Aspirin  Nausea And Vomiting and Palpitations    Can tolerate baby aspirin  81 mg without difficulty but can't tolerate higher dosages   Azithromycin  Palpitations   Codeine Palpitations    You were cared for by a hospitalist during your hospital stay. If you have any questions about your discharge medications or the care you received while you were in the hospital after you are discharged, you can call the unit and asked to speak with the hospitalist on call if the hospitalist that took care of you is not available. Once you are discharged, your primary care physician will handle any further medical issues. Please note that no refills for any discharge medications will be authorized once you are discharged, as it is imperative that you return to your primary care physician (or establish a relationship with a primary care physician if you do not have one) for your aftercare needs so that they can reassess your need for medications and monitor your lab values.  You were cared for by a hospitalist during your hospital stay. If you have any questions about your discharge medications or the care you received while you were in the hospital after you are discharged, you can call the unit and asked to speak with the hospitalist on call if the hospitalist that took care of you is not available. Once you are discharged, your primary care physician will handle any further medical issues. Please note that NO REFILLS for any discharge medications will be authorized once you are discharged, as it is imperative that you return to your primary care physician (or establish a relationship with a primary care physician if you do not have one) for your aftercare needs so that they can reassess your need for medications and monitor your lab  values.  Please request your Prim.MD to go over all Hospital Tests and Procedure/Radiological results at the follow up, please get all Hospital records sent to your Prim MD by signing hospital release before you go home.  Get CBC, CMP, 2 view Chest X ray checked  by Primary MD during your next visit or SNF MD in 5-7 days ( we routinely change or add medications that can affect your baseline labs and fluid status, therefore we recommend  that you get the mentioned basic workup next visit with your PCP, your PCP may decide not to get them or add new tests based on their clinical decision)  On your next visit with your primary care physician please Get Medicines reviewed and adjusted.  If you experience worsening of your admission symptoms, develop shortness of breath, life threatening emergency, suicidal or homicidal thoughts you must seek medical attention immediately by calling 911 or calling your MD immediately  if symptoms less severe.  You Must read complete instructions/literature along with all the possible adverse reactions/side effects for all the Medicines you take and that have been prescribed to you. Take any new Medicines after you have completely understood and accpet all the possible adverse reactions/side effects.   Do not drive, operate heavy machinery, perform activities at heights, swimming or participation in water activities or provide baby sitting services if your were admitted for syncope or siezures until you have seen by Primary MD or a Neurologist and advised to do so again.  Do not drive when taking Pain medications.   Procedures/Studies: DG Chest Port 1 View Result Date: 05/18/2024 CLINICAL DATA:  Hemoptysis. EXAM: PORTABLE CHEST 1 VIEW COMPARISON:  05/14/2024 FINDINGS: The cardio pericardial silhouette is enlarged. Low lung volumes with persistent right base interstitial and airspace disease, potentially scarring from prior therapy for lung cancer. Interstitial markings  are diffusely coarsened with chronic features. No acute bony abnormality. IMPRESSION: Stable exam. Low lung volumes with persistent right base interstitial and airspace disease, potentially scarring from prior therapy for lung cancer. Electronically Signed   By: Donnal Fusi M.D.   On: 05/18/2024 07:49   CT Angio Chest PE W and/or Wo Contrast Result Date: 05/14/2024 CLINICAL DATA:  Lung cancer, superimposed pneumonia on prednisone , hemoptysis. Pulmonary embolism. * Tracking Code: BO * EXAM: CT ANGIOGRAPHY CHEST WITH CONTRAST TECHNIQUE: Multidetector CT imaging of the chest was performed using the standard protocol during bolus administration of intravenous contrast. Multiplanar CT image reconstructions and MIPs were obtained to evaluate the vascular anatomy. RADIATION DOSE REDUCTION: This exam was performed according to the departmental dose-optimization program which includes automated exposure control, adjustment of the mA and/or kV according to patient size and/or use of iterative reconstruction technique. CONTRAST:  75mL OMNIPAQUE  IOHEXOL  350 MG/ML SOLN COMPARISON:  04/04/2024, 02/13/2024 FINDINGS: Cardiovascular: There is adequate opacification of the pulmonary arterial tree. Small eccentric mural filling defects within the right lower lobar pulmonary arterial tree is artifactual related to volume averaging. No evidence of acute pulmonary embolism. The central pulmonary arteries are enlarged in keeping with changes of pulmonary arterial hypertension. Extensive multi-vessel coronary artery calcification. Stable cardiomegaly. No pericardial effusion. Mild atherosclerotic calcification within the thoracic aorta. No aortic aneurysm. Mediastinum/Nodes: Confluent right hilar, subcarinal, and low right paratracheal adenopathy is again identified and appears progressive. This is not optimal delineated, however, due to the early phase of contrast enhancement. Visualized thyroid  is unremarkable. The esophagus is  unremarkable. Lungs/Pleura: Fiducial marker again seen posterior basal right lower lobe. Surrounding consolidation, traction bronchiectasis, and architectural distortion with peripheral honeycombing is present in keeping with the sequela of radiation therapy. This area of consolidation appears confluent with the right hilum and previously noted hilar adenopathy. Small but enlarging partially loculated right pleural effusion. Superimposed moderate emphysema. Increasing diffuse background ground-glass opacity throughout the lungs is noted possibly reflecting mild diffuse alveolar pulmonary edema. No pneumothorax. Soft tissue is seen posteriorly within the bronchus intermedius which is new from prior examination and is located in the region  of confluent hilar adenopathy. While this may simply represent adherent mucus, direct endobronchial extension of the mass is not excluded. This is best seen on image 65/4. Upper Abdomen: No acute abnormality. Musculoskeletal: Remote appearing L1 superior endplate fracture. No acute bone abnormality. No lytic or blastic bone lesion. Review of the MIP images confirms the above findings. IMPRESSION: 1. No evidence of acute pulmonary embolism. 2. Stable cardiomegaly. 3. Extensive multi-vessel coronary artery calcification. 4. Increasing background ground-glass opacity throughout the lungs bilaterally possibly reflecting mild diffuse alveolar pulmonary edema. 5. New soft tissue within the bronchus intermedius which may simply represent adherent mucus, however, direct endobronchial extension of the mass is not excluded. Short-term follow-up examination with standard contrast enhancement or direct visualization may be helpful for further evaluation. 6. Progressive right hilar, subcarinal, and low right paratracheal adenopathy. 7. Post radiation changes within the posterior basal right lower lobe. 8. Enlarged central pulmonary arteries in keeping with changes of pulmonary arterial  hypertension. Aortic Atherosclerosis (ICD10-I70.0) and Emphysema (ICD10-J43.9). Electronically Signed   By: Worthy Heads M.D.   On: 05/14/2024 23:02   DG Chest Portable 1 View Result Date: 05/14/2024 CLINICAL DATA:  Hemoptysis. EXAM: PORTABLE CHEST 1 VIEW COMPARISON:  Multiple previous imaging studies. The most recent chest x-ray is for levin 2025. The most recent CT scan is 04/05/1999 FINDINGS: The cardiac silhouette, mediastinal and hilar contours are within normal limits and stable. Stable tortuosity and calcification of the thoracic aorta. Stable underlying fibrotic lung disease and persistent diffuse interstitial and airspace process in the right lower lobe, possibly chronic infection/aspiration. No pneumothorax. IMPRESSION: Stable underlying fibrotic lung disease and persistent diffuse interstitial and airspace process in the right lower lobe, possibly chronic infection/aspiration. Electronically Signed   By: Marrian Siva M.D.   On: 05/14/2024 19:48     The results of significant diagnostics from this hospitalization (including imaging, microbiology, ancillary and laboratory) are listed below for reference.     Microbiology: No results found for this or any previous visit (from the past 240 hours).   Labs: BNP (last 3 results) Recent Labs    04/06/24 1807 04/10/24 0411  BNP 141.6* 90.9   Basic Metabolic Panel: Recent Labs  Lab 05/14/24 1844 05/18/24 0809 05/19/24 0558 05/20/24 0515  NA 136 134* 136 135  K 4.2 3.4* 3.8 4.3  CL 103 100 104 103  CO2 23 24 24 23   GLUCOSE 340* 130* 137* 155*  BUN 20 30* 24* 25*  CREATININE 0.74 0.57 0.85 0.93  CALCIUM 8.9 9.4 9.1 8.5*  MG  --   --   --  2.2  PHOS  --   --   --  2.5   Liver Function Tests: Recent Labs  Lab 05/14/24 1844 05/19/24 0558  AST 15 11*  ALT 7 6  ALKPHOS 65 57  BILITOT 0.5 0.4  PROT 8.0 7.2  ALBUMIN 3.5 3.0*   No results for input(s): "LIPASE", "AMYLASE" in the last 168 hours. No results for input(s):  "AMMONIA" in the last 168 hours. CBC: Recent Labs  Lab 05/14/24 1844 05/18/24 0809 05/19/24 0558 05/20/24 0515  WBC 6.1 6.4 5.8 6.1  NEUTROABS 5.3 4.6  --   --   HGB 11.7* 10.6* 11.0* 10.6*  HCT 39.1 34.3* 36.6 36.2  MCV 88.7 89.1 89.5 91.2  PLT 334 273 304 312   Cardiac Enzymes: No results for input(s): "CKTOTAL", "CKMB", "CKMBINDEX", "TROPONINI" in the last 168 hours. BNP: Invalid input(s): "POCBNP" CBG: Recent Labs  Lab 05/19/24 1620 05/19/24  1841 05/19/24 2040 05/19/24 2212 05/20/24 0804  GLUCAP 446* 448* 231* 262* 189*   D-Dimer No results for input(s): "DDIMER" in the last 72 hours. Hgb A1c No results for input(s): "HGBA1C" in the last 72 hours. Lipid Profile No results for input(s): "CHOL", "HDL", "LDLCALC", "TRIG", "CHOLHDL", "LDLDIRECT" in the last 72 hours. Thyroid  function studies No results for input(s): "TSH", "T4TOTAL", "T3FREE", "THYROIDAB" in the last 72 hours.  Invalid input(s): "FREET3" Anemia work up No results for input(s): "VITAMINB12", "FOLATE", "FERRITIN", "TIBC", "IRON", "RETICCTPCT" in the last 72 hours. Urinalysis No results found for: "COLORURINE", "APPEARANCEUR", "LABSPEC", "PHURINE", "GLUCOSEU", "HGBUR", "BILIRUBINUR", "KETONESUR", "PROTEINUR", "UROBILINOGEN", "NITRITE", "LEUKOCYTESUR" Sepsis Labs Recent Labs  Lab 05/14/24 1844 05/18/24 0809 05/19/24 0558 05/20/24 0515  WBC 6.1 6.4 5.8 6.1   Microbiology No results found for this or any previous visit (from the past 240 hours).   Time coordinating discharge:  I have spent 35 minutes face to face with the patient and on the ward discussing the patients care, assessment, plan and disposition with other care givers. >50% of the time was devoted counseling the patient about the risks and benefits of treatment/Discharge disposition and coordinating care.   SIGNED:   Maggie Schooner, MD  [facility] 05/20/2024, 9:25 AM   If 7PM-7AM, please contact night-coverage

## 2024-05-21 ENCOUNTER — Encounter (HOSPITAL_COMMUNITY): Payer: Self-pay | Admitting: Pulmonary Disease

## 2024-05-23 LAB — CYTOLOGY - NON PAP

## 2024-05-24 LAB — CULTURE, RESPIRATORY W GRAM STAIN: Culture: NORMAL

## 2024-05-30 ENCOUNTER — Other Ambulatory Visit

## 2024-05-30 ENCOUNTER — Ambulatory Visit

## 2024-05-30 ENCOUNTER — Ambulatory Visit: Admitting: Internal Medicine

## 2024-06-02 NOTE — Progress Notes (Unsigned)
 [facility] OFFICE PROGRESS NOTE  Lindsey Cruz, MD [address] [place] [address]  DIAGNOSIS: 1) history of stage Ia non-small cell lung cancer, adenocarcinoma the right upper lobe who under went radiation under the care of Dr. Lorri Rota from 04/23/2022-04/29/22 2 Stage IIIa confirm (T3, N2, M0) non-small cell lung cancer, squamous cell carcinoma. She presented with recurrent tumor at the right lung base, associated right lower lobe metastasis, and right hilar and subcarinal nodal metastases. This was diagnosed in October 2024.   PDL1: 85%   PRIOR THERAPY: 1) Radiation to the right upper lobe under the care of Dr. Lorri Rota from 04/23/2022-04/29/22  2) Concurrent chemoradiation with carboplatin  for an AUC of 2 and paclitaxel  45 mg/m.  Last dose on 12/20/23 Status post 7 cycle.  3) Consolidation immunotherapy with Imfinzi  1500 mg IV every 4 weeks, first dose expected on 02/07/2024. Discontinued after 1 cycle due to concern for pneumonitis  CURRENT THERAPY: Observation   INTERVAL HISTORY: Lindsey Williamson 73 y.o. female returns to the clinic today for a follow-up visit accompanied by her husband.  In summary the patient was diagnosed with stage III non-small cell lung cancer.  She underwent a course of concurrent chemoradiation but she experienced radiation pneumonitis and some chronic interstitial lung disease.  She underwent 1 cycle of immunotherapy with Imfinzi  but was hospitalized for cough and hypoxia and there was some concern for pneumonitis.  Therefore, her immunotherapy was discontinued and she is on observation.  She was last seen in the clinic in March 2025. In the interval since being seen she was hospitalized twice due to breathing concerns. Her most recent hospital admission was on 5/23-5/25 and she presented with hemoptysis and she was treated for COPD flare with prolonged taper. She had a bronchoscopy on 5/24 that showed an endobronchial lesion which  is at high risk for bleeding.  Recommended cough syrup. She also is on a prednisone  taper at this time.   She saw the pulmonologist yesterday with Dr. Gaynell Keeler. Her only concern was dyspnea. She still has some mild blood tinged mucus intermittenly. Her pulmonologist told her if mild to monitor but significant to go to ER. Today she denies any fever, chills, night sweats, or unexplained weight loss.  Her breathing is fair. Some days are better than others. She is wearing 3 L of oxygen via nasal cannula. She is doing PT.  She denies a significant cough just intermittent. She reports some days are better than others.  She denies any chest pain.  She denies any nausea, vomiting, or diarrhea. She does have constipation. She takes laxative. She denies any headache or visual changes.  She had a CT angiogram performed recently.  She is here today for evaluation and to review her scan results.   MEDICAL HISTORY: Past Medical History:  Diagnosis Date   Angioedema 10/17/2023   Arthritis    Diabetes mellitus without complication (HCC)    Hypertension     ALLERGIES:  is allergic to benzonatate, lisinopril, aspirin , azithromycin , and codeine .  MEDICATIONS:  Current Outpatient Medications  Medication Sig Dispense Refill   albuterol  (VENTOLIN  HFA) 108 (90 Base) MCG/ACT inhaler Inhale 2 puffs into the lungs every 6 (six) hours as needed for wheezing or shortness of breath. 6.7 g 0   amLODipine  (NORVASC ) 5 MG tablet Take 5 mg by mouth in the morning.     aspirin  EC 81 MG tablet Take 81 mg by mouth daily. Swallow whole.  bisoprolol  (ZEBETA ) 5 MG tablet Take 2.5 mg by mouth daily.     cetirizine  (ZYRTEC  ALLERGY ) 10 MG tablet Take 1 tablet (10 mg total) by mouth daily. 90 tablet 1   diclofenac Sodium (VOLTAREN) 1 % GEL Apply 1 Application topically daily as needed (pain).     empagliflozin  (JARDIANCE ) 25 MG TABS tablet Take 25 mg by mouth daily at 6 PM.     ferrous sulfate  325 (65 FE) MG tablet Take 325 mg by  mouth daily with breakfast.     glipiZIDE  (GLUCOTROL  XL) 5 MG 24 hr tablet Take 1 tablet (5 mg total) by mouth daily with breakfast. 30 tablet 0   guaiFENesin  (MUCINEX ) 600 MG 12 hr tablet Take 1 tablet (600 mg total) by mouth 2 (two) times daily. (Patient not taking: Reported on 05/18/2024) 60 tablet 0   guaiFENesin -codeine  100-10 MG/5ML syrup Take 5 mLs by mouth every 4 (four) hours as needed for cough (First-line). 120 mL 0   guaiFENesin -dextromethorphan (ROBITUSSIN DM) 100-10 MG/5ML syrup Take 5 mLs by mouth every 4 (four) hours as needed for cough. (Patient not taking: Reported on 05/18/2024) 118 mL 0   insulin  aspart (NOVOLOG ) 100 UNIT/ML FlexPen Inject 0-11 Units into the skin 3 (three) times daily with meals. Check Blood Glucose (BG) and inject per scale: BG <150= 0 unit; BG 150-200= 1 unit; BG 201-250= 3 unit; BG 251-300= 5 unit; BG 301-350= 7 unit; BG 351-400= 9 unit; BG >400= 11 unit and Call Primary Care. (Patient taking differently: Inject 7-10 Units into the skin as needed for high blood sugar.) 15 mL 0   ipratropium-albuterol  (DUONEB) 0.5-2.5 (3) MG/3ML SOLN Take 3 mLs by nebulization in the morning and at bedtime. 360 mL 2   metFORMIN (GLUCOPHAGE-XR) 500 MG 24 hr tablet Take 1,000 mg by mouth in the morning and at bedtime.     omeprazole  (PRILOSEC) 20 MG capsule Take 20 mg by mouth daily.     polyethylene glycol (MIRALAX  / GLYCOLAX ) 17 g packet Take 17 g packet by mouth daily. (Patient taking differently: Take 17 g by mouth daily as needed for mild constipation or moderate constipation.) 14 each 0   pravastatin  (PRAVACHOL ) 80 MG tablet Take 80 mg by mouth at bedtime.     predniSONE  (DELTASONE ) 10 MG tablet Take 1 tablet (10 mg total) by mouth daily with breakfast for 7 days, THEN 0.5 tablets (5 mg total) daily with breakfast for 7 days. 10.5 tablet 0   promethazine -dextromethorphan (PROMETHAZINE -DM) 6.25-15 MG/5ML syrup Take 5 mLs by mouth 4 (four) times daily as needed for cough. 118 mL 0    sulfamethoxazole -trimethoprim  (BACTRIM  DS) 800-160 MG tablet Take 1 tablet by mouth 3 (three) times a week. (Patient taking differently: Take 1 tablet by mouth 3 (three) times a week. Monday, Thursday, Saturday) 36 tablet 0   umeclidinium-vilanterol (ANORO ELLIPTA ) 62.5-25 MCG/ACT AEPB Inhale 1 puff into the lungs daily. (Patient not taking: Reported on 05/18/2024) 14 each 6   No current facility-administered medications for this visit.    SURGICAL HISTORY:  Past Surgical History:  Procedure Laterality Date   BRONCHIAL BIOPSY  03/30/2022   Procedure: BRONCHIAL BIOPSIES;  Surgeon: Prudy Brownie, DO;  Location: [facility] ENDOSCOPY;  Service: Pulmonary;;   BRONCHIAL BIOPSY  10/17/2023   Procedure: BRONCHIAL BIOPSIES;  Surgeon: Denson Flake, MD;  Location: [facility] ENDOSCOPY;  Service: Pulmonary;;   BRONCHIAL BRUSHINGS  03/30/2022   Procedure: BRONCHIAL BRUSHINGS;  Surgeon: Prudy Brownie, DO;  Location: [facility] ENDOSCOPY;  Service: Pulmonary;;   BRONCHIAL BRUSHINGS  10/17/2023   Procedure: BRONCHIAL BRUSHINGS;  Surgeon: Denson Flake, MD;  Location: [facility] ENDOSCOPY;  Service: Pulmonary;;   BRONCHIAL NEEDLE ASPIRATION BIOPSY  03/30/2022   Procedure: BRONCHIAL NEEDLE ASPIRATION BIOPSIES;  Surgeon: Prudy Brownie, DO;  Location: [facility] ENDOSCOPY;  Service: Pulmonary;;   BRONCHIAL NEEDLE ASPIRATION BIOPSY  10/17/2023   Procedure: BRONCHIAL NEEDLE ASPIRATION BIOPSIES;  Surgeon: Denson Flake, MD;  Location: [facility] ENDOSCOPY;  Service: Pulmonary;;   BRONCHIAL WASHINGS  05/19/2024   Procedure: IRRIGATION, BRONCHUS;  Surgeon: Lind Repine, MD;  Location: [facility] ENDOSCOPY;  Service: Cardiopulmonary;;   EYE SURGERY     FIDUCIAL MARKER PLACEMENT  03/30/2022   Procedure: FIDUCIAL MARKER PLACEMENT;  Surgeon: Prudy Brownie, DO;  Location: [facility] ENDOSCOPY;  Service: Pulmonary;;   FINE NEEDLE ASPIRATION  10/17/2023   Procedure: FINE NEEDLE ASPIRATION;  Surgeon: Denson Flake, MD;  Location: [facility] ENDOSCOPY;  Service: Pulmonary;;    FOREIGN BODY REMOVAL  03/30/2022   Procedure: FOREIGN BODY REMOVAL;  Surgeon: Prudy Brownie, DO;  Location: [facility] ENDOSCOPY;  Service: Pulmonary;;   HEMOSTASIS CONTROL  10/17/2023   Procedure: HEMOSTASIS CONTROL;  Surgeon: Denson Flake, MD;  Location: [facility] ENDOSCOPY;  Service: Pulmonary;;   VIDEO BRONCHOSCOPY Bilateral 05/19/2024   Procedure: VIDEO BRONCHOSCOPY WITHOUT FLUORO;  Surgeon: Lind Repine, MD;  Location: [facility] ENDOSCOPY;  Service: Cardiopulmonary;  Laterality: Bilateral;   VIDEO BRONCHOSCOPY WITH ENDOBRONCHIAL ULTRASOUND Bilateral 03/30/2022   Procedure: VIDEO BRONCHOSCOPY WITH ENDOBRONCHIAL ULTRASOUND;  Surgeon: Prudy Brownie, DO;  Location: [facility] ENDOSCOPY;  Service: Pulmonary;  Laterality: Bilateral;   VIDEO BRONCHOSCOPY WITH ENDOBRONCHIAL ULTRASOUND Right 10/17/2023   Procedure: VIDEO BRONCHOSCOPY WITH ENDOBRONCHIAL ULTRASOUND;  Surgeon: Denson Flake, MD;  Location: [facility] ENDOSCOPY;  Service: Pulmonary;  Laterality: Right;   VIDEO BRONCHOSCOPY WITH RADIAL ENDOBRONCHIAL ULTRASOUND  03/30/2022   Procedure: VIDEO BRONCHOSCOPY WITH RADIAL ENDOBRONCHIAL ULTRASOUND;  Surgeon: Prudy Brownie, DO;  Location: [facility] ENDOSCOPY;  Service: Pulmonary;;    REVIEW OF SYSTEMS:   Review of Systems  Constitutional: Positive for fatigue. Negative for appetite change, chills, fever and unexpected weight change.  HENT: Negative for mouth sores, nosebleeds, sore throat and trouble swallowing.   Eyes: Negative for eye problems and icterus.  Respiratory: Positive for dyspnea on exertion and mild intermittent cough. Positive for improved but occasional blood tinged mucus. Negative for hemoptysis and wheezing.   Cardiovascular: Negative for chest pain and leg swelling.  Gastrointestinal: Positive for constipation. Negative for abdominal pain, diarrhea, nausea and vomiting.  Genitourinary: Negative for bladder incontinence, difficulty urinating, dysuria, frequency and hematuria.   Musculoskeletal: Negative for  back pain, gait problem, neck pain and neck stiffness.  Skin: Negative for itching and rash.  Neurological: Negative for dizziness, extremity weakness, gait problem, headaches, light-headedness and seizures.  Hematological: Negative for adenopathy. Does not bruise/bleed easily.  Psychiatric/Behavioral: Negative for confusion, depression and sleep disturbance. The patient is not nervous/anxious.     PHYSICAL EXAMINATION:  There were no vitals taken for this visit.  ECOG PERFORMANCE STATUS: 1  Physical Exam  Constitutional: Oriented to person, place, and time and well-developed, well-nourished, and in no distress. HENT:  Head: Normocephalic and atraumatic.  Mouth/Throat: Oropharynx is clear and moist. No oropharyngeal exudate.  Eyes: Conjunctivae are normal. Right eye exhibits no discharge. Left eye exhibits no discharge. No scleral icterus.  Neck: Normal range of motion. Neck supple.  Cardiovascular: Normal rate, regular rhythm, normal heart sounds and intact distal pulses.  Pulmonary/Chest: Effort normal. Diminished breath sounds in right lower lobe. On supplemental oxygen. No respiratory distress. No wheezes. No rales.  Abdominal: Soft. Bowel sounds are normal. Exhibits no distension and no mass. There is no tenderness.  Musculoskeletal: Normal range of motion. Exhibits no edema.  Lymphadenopathy:    No cervical adenopathy.  Neurological: Alert and oriented to person, place, and time. Exhibits normal muscle tone. Gait normal. Coordination normal.  Skin: Skin is warm and dry. No rash noted. Not diaphoretic. No erythema. No pallor.  Psychiatric: Mood, memory and judgment normal.  Vitals reviewed.  LABORATORY DATA: Lab Results  Component Value Date   WBC 6.1 05/20/2024   HGB 10.6 (L) 05/20/2024   HCT 36.2 05/20/2024   MCV 91.2 05/20/2024   PLT 312 05/20/2024      Chemistry      Component Value Date/Time   NA 135 05/20/2024 0515   K 4.3 05/20/2024 0515   CL 103 05/20/2024  0515   CO2 23 05/20/2024 0515   BUN 25 (H) 05/20/2024 0515   CREATININE 0.93 05/20/2024 0515   CREATININE 0.64 03/07/2024 0819      Component Value Date/Time   CALCIUM 8.5 (L) 05/20/2024 0515   ALKPHOS 57 05/19/2024 0558   AST 11 (L) 05/19/2024 0558   AST 13 (L) 03/07/2024 0819   ALT 6 05/19/2024 0558   ALT <5 03/07/2024 0819   BILITOT 0.4 05/19/2024 0558   BILITOT 0.3 03/07/2024 0819       RADIOGRAPHIC STUDIES:  DG Chest Port 1 View Result Date: 05/18/2024 CLINICAL DATA:  Hemoptysis. EXAM: PORTABLE CHEST 1 VIEW COMPARISON:  05/14/2024 FINDINGS: The cardio pericardial silhouette is enlarged. Low lung volumes with persistent right base interstitial and airspace disease, potentially scarring from prior therapy for lung cancer. Interstitial markings are diffusely coarsened with chronic features. No acute bony abnormality. IMPRESSION: Stable exam. Low lung volumes with persistent right base interstitial and airspace disease, potentially scarring from prior therapy for lung cancer. Electronically Signed   By: Donnal Fusi M.D.   On: 05/18/2024 07:49   CT Angio Chest PE W and/or Wo Contrast Result Date: 05/14/2024 CLINICAL DATA:  Lung cancer, superimposed pneumonia on prednisone , hemoptysis. Pulmonary embolism. * Tracking Code: BO * EXAM: CT ANGIOGRAPHY CHEST WITH CONTRAST TECHNIQUE: Multidetector CT imaging of the chest was performed using the standard protocol during bolus administration of intravenous contrast. Multiplanar CT image reconstructions and MIPs were obtained to evaluate the vascular anatomy. RADIATION DOSE REDUCTION: This exam was performed according to the departmental dose-optimization program which includes automated exposure control, adjustment of the mA and/or kV according to patient size and/or use of iterative reconstruction technique. CONTRAST:  75mL OMNIPAQUE  IOHEXOL  350 MG/ML SOLN COMPARISON:  04/04/2024, 02/13/2024 FINDINGS: Cardiovascular: There is adequate  opacification of the pulmonary arterial tree. Small eccentric mural filling defects within the right lower lobar pulmonary arterial tree is artifactual related to volume averaging. No evidence of acute pulmonary embolism. The central pulmonary arteries are enlarged in keeping with changes of pulmonary arterial hypertension. Extensive multi-vessel coronary artery calcification. Stable cardiomegaly. No pericardial effusion. Mild atherosclerotic calcification within the thoracic aorta. No aortic aneurysm. Mediastinum/Nodes: Confluent right hilar, subcarinal, and low right paratracheal adenopathy is again identified and appears progressive. This is not optimal delineated, however, due to the early phase of contrast enhancement. Visualized thyroid  is unremarkable. The esophagus is unremarkable. Lungs/Pleura: Fiducial marker again seen posterior basal right lower lobe. Surrounding consolidation, traction bronchiectasis, and architectural distortion with peripheral honeycombing is present in keeping  with the sequela of radiation therapy. This area of consolidation appears confluent with the right hilum and previously noted hilar adenopathy. Small but enlarging partially loculated right pleural effusion. Superimposed moderate emphysema. Increasing diffuse background ground-glass opacity throughout the lungs is noted possibly reflecting mild diffuse alveolar pulmonary edema. No pneumothorax. Soft tissue is seen posteriorly within the bronchus intermedius which is new from prior examination and is located in the region of confluent hilar adenopathy. While this may simply represent adherent mucus, direct endobronchial extension of the mass is not excluded. This is best seen on image 65/4. Upper Abdomen: No acute abnormality. Musculoskeletal: Remote appearing L1 superior endplate fracture. No acute bone abnormality. No lytic or blastic bone lesion. Review of the MIP images confirms the above findings. IMPRESSION: 1. No evidence  of acute pulmonary embolism. 2. Stable cardiomegaly. 3. Extensive multi-vessel coronary artery calcification. 4. Increasing background ground-glass opacity throughout the lungs bilaterally possibly reflecting mild diffuse alveolar pulmonary edema. 5. New soft tissue within the bronchus intermedius which may simply represent adherent mucus, however, direct endobronchial extension of the mass is not excluded. Short-term follow-up examination with standard contrast enhancement or direct visualization may be helpful for further evaluation. 6. Progressive right hilar, subcarinal, and low right paratracheal adenopathy. 7. Post radiation changes within the posterior basal right lower lobe. 8. Enlarged central pulmonary arteries in keeping with changes of pulmonary arterial hypertension. Aortic Atherosclerosis (ICD10-I70.0) and Emphysema (ICD10-J43.9). Electronically Signed   By: Worthy Heads M.D.   On: 05/14/2024 23:02   DG Chest Portable 1 View Result Date: 05/14/2024 CLINICAL DATA:  Hemoptysis. EXAM: PORTABLE CHEST 1 VIEW COMPARISON:  Multiple previous imaging studies. The most recent chest x-ray is for levin 2025. The most recent CT scan is 04/05/1999 FINDINGS: The cardiac silhouette, mediastinal and hilar contours are within normal limits and stable. Stable tortuosity and calcification of the thoracic aorta. Stable underlying fibrotic lung disease and persistent diffuse interstitial and airspace process in the right lower lobe, possibly chronic infection/aspiration. No pneumothorax. IMPRESSION: Stable underlying fibrotic lung disease and persistent diffuse interstitial and airspace process in the right lower lobe, possibly chronic infection/aspiration. Electronically Signed   By: Marrian Siva M.D.   On: 05/14/2024 19:48     ASSESSMENT/PLAN:  1) history of stage Ia non-small cell lung cancer, adenocarcinoma the right upper lobe who under went radiation under the care of Dr. Lorri Rota from 04/23/2022-04/29/22 2  Stage IIIa confirm (T3, N2, M0) non-small cell lung cancer, squamous cell carcinoma. She presented with recurrent tumor at the right lung base, associated right lower lobe metastasis, and right hilar and subcarinal nodal metastases. This was diagnosed in October 2024.    PDL1 expression 85%.    The patient is scheduled to undergo consolidation immunotherapy with Imfinzi  1500 mg IV every 4 weeks . First dose on 02/07/24.  She is status post 1 cycle.  Her treatment was discontinued due to concerns with pneumonitis.  She is currently on observation.  She recently had a CT angiogram performed.  The patient was seen with Dr. Marguerita Shih today.  Dr. Marguerita Shih personally and independently reviewed the scan and discussed the results with the patient today.   Mohamed recommends 2 to 3 weeks of antibiotics and 10 mg of prednisone  and a repeat CT scan of the chest in 4 weeks to assess whether the lymph nodes are reactive or progressive disease.  The patient was advised to hold her Bactrim  while she is taking doxycycline .  Concerns for disease progression, the Dr. Marguerita Shih may  have to revisit systemic chemotherapy.  Given her history of pneumonitis he likely would not recommend immunotherapy.  Constipation due to iron supplements Constipation from iron supplements. Irregular stool softener use. - Recommend daily stool softener such as Colace. - Encourage regular use of Miralax  or similar as needed.  She has been on doxycyline before and tolerated it well.   The patient was advised to call immediately if he has any concerning symptoms in the interval. The patient voices understanding of current disease status and treatment options and is in agreement with the current care plan. All questions were answered. The patient knows to call the clinic with any problems, questions or concerns. We can certainly see the patient much sooner if necessary    No orders of the defined types were placed in this encounter.     Lindsey Greff L Stephinie Battisti, PA-C 06/02/24  ADDENDUM: Hematology/Oncology Attending: I had a face-to-face encounter with the patient today.  I reviewed her records, lab, scan and recommended her care plan.  This is a very pleasant 73 years old African-American female with stage IIIa non-small cell lung cancer, squamous cell carcinoma diagnosed in October 2024 with PD-L1 expression of 85%.  The patient started a course of concurrent chemoradiation with weekly carboplatin  and paclitaxel  followed by 1 cycle of consolidation treatment with immunotherapy with Imfinzi  discontinued secondary to suspicious immunotherapy mediated pneumonitis.  She has been on observation since that time.  The patient had been complaining of increasing fatigue and weakness as well as shortness of breath and she was treated with recently she was admitted to the hospital with significant shortness of breath as well as hemoptysis.  She had a bronchoscopy on 05/20/2023 and that showed endobronchial lesion that appeared to be high risk for bleeding.  She had imaging studies including CT angiogram of the chest on 05/15/2023 and that showed no evidence for acute pulmonary embolism but there was extensive multivessel coronary artery calcification and increasing background groundglass opacity throughout the lungs bilaterally proximal possibly reflecting mild diffuse alveolar pulmonary edema.  There was also new soft tissue within the bronchus intermedius and progressive right hilar, subcarinal and lower right paratracheal adenopathy in addition to the postradiation changes within the posterior basal right lower lobe.  The patient was referred back to me today for evaluation and recommendation regarding treatment of her condition.  She has been on a stable dose of prednisone  2.5 mg p.o. daily. I personally and independently reviewed the previous imaging studies as well as [facility] report.  I recommended for the patient to start a course of  antibiotic.  Doxycycline  100 mg p.o. twice daily for the next 2 weeks to help.  Will increase her dose of prednisone  to 10 mg p.o. daily for the next 3 weeks.  We will repeat CT scan of the chest in 1 months for evaluation of her condition after improvement of the groundglass opacity and scarring to see if she continues to have evidence for disease progression or this was just postinflammatory changes on top of her known non-small cell lung cancer. The patient and her husband are in agreement with the current plan. She was advised to call immediately if she has any other concerning symptoms in the interval. The total time spent in the appointment was 30 minutes including review of chart and various tests results, discussions about plan of care and coordination of care plan . Disclaimer: This note was dictated with voice recognition software. Similar sounding words can inadvertently be transcribed and may be  missed upon review. Aurelio Blower, MD .

## 2024-06-05 ENCOUNTER — Ambulatory Visit (INDEPENDENT_AMBULATORY_CARE_PROVIDER_SITE_OTHER): Admitting: Pulmonary Disease

## 2024-06-05 ENCOUNTER — Encounter: Payer: Self-pay | Admitting: Pulmonary Disease

## 2024-06-05 VITALS — BP 139/87 | HR 95 | Ht 63.0 in | Wt 153.8 lb

## 2024-06-05 DIAGNOSIS — J984 Other disorders of lung: Secondary | ICD-10-CM

## 2024-06-05 DIAGNOSIS — J439 Emphysema, unspecified: Secondary | ICD-10-CM | POA: Diagnosis not present

## 2024-06-05 DIAGNOSIS — J9611 Chronic respiratory failure with hypoxia: Secondary | ICD-10-CM

## 2024-06-05 DIAGNOSIS — J4489 Other specified chronic obstructive pulmonary disease: Secondary | ICD-10-CM

## 2024-06-05 NOTE — Patient Instructions (Signed)
 Will see you back in about 2 months  Continue using your inhaler  Regular exercise as tolerated  Call us  with significant concerns  Continue oxygen on a regular basis

## 2024-06-06 ENCOUNTER — Inpatient Hospital Stay: Attending: Radiation Oncology | Admitting: Physician Assistant

## 2024-06-06 VITALS — BP 138/88 | HR 90 | Temp 98.2°F | Wt 153.0 lb

## 2024-06-06 DIAGNOSIS — J439 Emphysema, unspecified: Secondary | ICD-10-CM | POA: Diagnosis not present

## 2024-06-06 DIAGNOSIS — Z794 Long term (current) use of insulin: Secondary | ICD-10-CM | POA: Insufficient documentation

## 2024-06-06 DIAGNOSIS — K5903 Drug induced constipation: Secondary | ICD-10-CM | POA: Diagnosis not present

## 2024-06-06 DIAGNOSIS — R042 Hemoptysis: Secondary | ICD-10-CM | POA: Diagnosis not present

## 2024-06-06 DIAGNOSIS — C778 Secondary and unspecified malignant neoplasm of lymph nodes of multiple regions: Secondary | ICD-10-CM | POA: Diagnosis not present

## 2024-06-06 DIAGNOSIS — Z7984 Long term (current) use of oral hypoglycemic drugs: Secondary | ICD-10-CM | POA: Insufficient documentation

## 2024-06-06 DIAGNOSIS — E119 Type 2 diabetes mellitus without complications: Secondary | ICD-10-CM | POA: Diagnosis not present

## 2024-06-06 DIAGNOSIS — Z7982 Long term (current) use of aspirin: Secondary | ICD-10-CM | POA: Insufficient documentation

## 2024-06-06 DIAGNOSIS — C3431 Malignant neoplasm of lower lobe, right bronchus or lung: Secondary | ICD-10-CM

## 2024-06-06 DIAGNOSIS — I2721 Secondary pulmonary arterial hypertension: Secondary | ICD-10-CM | POA: Insufficient documentation

## 2024-06-06 DIAGNOSIS — I119 Hypertensive heart disease without heart failure: Secondary | ICD-10-CM | POA: Insufficient documentation

## 2024-06-06 DIAGNOSIS — Z7952 Long term (current) use of systemic steroids: Secondary | ICD-10-CM | POA: Insufficient documentation

## 2024-06-06 DIAGNOSIS — C3411 Malignant neoplasm of upper lobe, right bronchus or lung: Secondary | ICD-10-CM | POA: Diagnosis present

## 2024-06-06 DIAGNOSIS — R059 Cough, unspecified: Secondary | ICD-10-CM | POA: Diagnosis not present

## 2024-06-06 DIAGNOSIS — Z79899 Other long term (current) drug therapy: Secondary | ICD-10-CM | POA: Insufficient documentation

## 2024-06-06 DIAGNOSIS — Z923 Personal history of irradiation: Secondary | ICD-10-CM | POA: Diagnosis not present

## 2024-06-06 DIAGNOSIS — I700 Atherosclerosis of aorta: Secondary | ICD-10-CM | POA: Insufficient documentation

## 2024-06-06 DIAGNOSIS — C7801 Secondary malignant neoplasm of right lung: Secondary | ICD-10-CM | POA: Insufficient documentation

## 2024-06-06 MED ORDER — DOXYCYCLINE HYCLATE 100 MG PO TABS: 100.0000 mg | ORAL_TABLET | Freq: Two times a day (BID) | ORAL | 0 refills | Status: DC

## 2024-06-06 MED ORDER — PREDNISONE 10 MG PO TABS: 10.0000 mg | ORAL_TABLET | Freq: Every day | ORAL | 0 refills | Status: DC

## 2024-06-06 NOTE — Progress Notes (Signed)
 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 Had presented with recurrent tumor at the right base associated with lower lobe metastasis and right hilar and subcarinal nodal metastasis diagnosed in October 2024  History of radiation to right upper lobe, chemoradiation Immunotherapy with Imfinzi   Developed medication associated pneumonitis - Recently started on tapering doses of steroids which will be completed in a few days  Was recently evaluated in the hospital for hemoptysis - Still has minimal pink-colored sputum No frank hemoptysis  Underlying history of interstitial lung disease, COPD, coronary artery disease hypertension, diabetes  She is feeling better still feels tired and frail On oxygen around-the-clock   Outpatient Encounter Medications as of 06/05/2024  Medication Sig   albuterol  (VENTOLIN  HFA) 108 (90 Base) MCG/ACT inhaler Inhale 2 puffs into the lungs every 6 (six) hours as needed for wheezing or shortness of breath.   amLODipine  (NORVASC ) 5 MG tablet Take 5 mg by mouth in the morning.   aspirin  EC 81 MG tablet Take 81 mg by mouth daily. Swallow whole.   bisoprolol  (ZEBETA ) 5 MG tablet Take 2.5 mg by mouth daily.   cetirizine  (ZYRTEC  ALLERGY ) 10 MG tablet Take 1 tablet (10 mg total) by mouth daily.   diclofenac Sodium (VOLTAREN) 1 % GEL Apply 1 Application topically daily as needed (pain).   empagliflozin  (JARDIANCE ) 25 MG TABS tablet Take 25 mg by mouth daily at 6 PM.   ferrous sulfate  325 (65 FE) MG tablet Take 325 mg by mouth daily with breakfast.   glipiZIDE   (GLUCOTROL  XL) 5 MG 24 hr tablet Take 1 tablet (5 mg total) by mouth daily with breakfast.   guaiFENesin -codeine  100-10 MG/5ML syrup Take 5 mLs by mouth every 4 (four) hours as needed for cough (First-line).   insulin  aspart (NOVOLOG ) 100 UNIT/ML FlexPen Inject 0-11 Units into the skin 3 (three) times daily with meals. Check Blood Glucose (BG) and inject per scale: BG <150= 0 unit; BG 150-200= 1 unit; BG 201-250= 3 unit; BG 251-300= 5 unit; BG 301-350= 7 unit; BG 351-400= 9 unit; BG >400= 11 unit and Call Primary Care. (Patient taking differently: Inject 7-10 Units into the skin as needed for high blood sugar.)   ipratropium-albuterol  (DUONEB) 0.5-2.5 (3) MG/3ML SOLN Take 3 mLs by nebulization in the morning and at bedtime.   metFORMIN (GLUCOPHAGE-XR) 500 MG 24 hr tablet Take 1,000 mg by mouth in the morning and at bedtime.   omeprazole  (PRILOSEC) 20 MG capsule Take 20 mg by mouth daily.   polyethylene glycol (MIRALAX  / GLYCOLAX ) 17 g packet Take 17 g packet by mouth daily. (Patient taking differently: Take 17 g by mouth daily as needed for mild constipation or moderate constipation.)   pravastatin  (PRAVACHOL ) 80 MG tablet Take 80 mg by mouth at bedtime.   promethazine -dextromethorphan (PROMETHAZINE -DM) 6.25-15 MG/5ML syrup Take 5 mLs by mouth 4 (four) times daily as needed for cough.   guaiFENesin  (MUCINEX ) 600 MG 12 hr tablet Take 1 tablet (600 mg total) by mouth 2 (two) times daily. (Patient not taking: Reported on 05/18/2024)  guaiFENesin -dextromethorphan (ROBITUSSIN DM) 100-10 MG/5ML syrup Take 5 mLs by mouth every 4 (four) hours as needed for cough. (Patient not taking: Reported on 05/18/2024)   sulfamethoxazole -trimethoprim  (BACTRIM  DS) 800-160 MG tablet Take 1 tablet by mouth 3 (three) times a week. (Patient not taking: Reported on 06/05/2024)   umeclidinium-vilanterol (ANORO ELLIPTA ) 62.5-25 MCG/ACT AEPB Inhale 1 puff into the lungs daily. (Patient not taking: Reported on 05/18/2024)   No  facility-administered encounter medications on file as of 06/05/2024.    Allergies as of 06/05/2024 - Review Complete 06/05/2024  Allergen Reaction Noted   Benzonatate Palpitations 12/13/2022   Lisinopril Swelling and Other (See Comments) 10/17/2023   Aspirin  Nausea And Vomiting and Palpitations 12/12/2013   Azithromycin  Palpitations 03/14/2015   Codeine  Palpitations 03/14/2015    Past Medical History:  Diagnosis Date   Angioedema 10/17/2023   Arthritis    Diabetes mellitus without complication (HCC)    Hypertension     Past Surgical History:  Procedure Laterality Date   BRONCHIAL BIOPSY  03/30/2022   Procedure: BRONCHIAL BIOPSIES;  Surgeon: Prudy Brownie, DO;  Location: [facility] ENDOSCOPY;  Service: Pulmonary;;   BRONCHIAL BIOPSY  10/17/2023   Procedure: BRONCHIAL BIOPSIES;  Surgeon: Denson Flake, MD;  Location: [facility] ENDOSCOPY;  Service: Pulmonary;;   BRONCHIAL BRUSHINGS  03/30/2022   Procedure: BRONCHIAL BRUSHINGS;  Surgeon: Prudy Brownie, DO;  Location: [facility] ENDOSCOPY;  Service: Pulmonary;;   BRONCHIAL BRUSHINGS  10/17/2023   Procedure: BRONCHIAL BRUSHINGS;  Surgeon: Denson Flake, MD;  Location: [facility] ENDOSCOPY;  Service: Pulmonary;;   BRONCHIAL NEEDLE ASPIRATION BIOPSY  03/30/2022   Procedure: BRONCHIAL NEEDLE ASPIRATION BIOPSIES;  Surgeon: Prudy Brownie, DO;  Location: [facility] ENDOSCOPY;  Service: Pulmonary;;   BRONCHIAL NEEDLE ASPIRATION BIOPSY  10/17/2023   Procedure: BRONCHIAL NEEDLE ASPIRATION BIOPSIES;  Surgeon: Denson Flake, MD;  Location: [facility] ENDOSCOPY;  Service: Pulmonary;;   BRONCHIAL WASHINGS  05/19/2024   Procedure: IRRIGATION, BRONCHUS;  Surgeon: Lind Repine, MD;  Location: [facility] ENDOSCOPY;  Service: Cardiopulmonary;;   EYE SURGERY     FIDUCIAL MARKER PLACEMENT  03/30/2022   Procedure: FIDUCIAL MARKER PLACEMENT;  Surgeon: Prudy Brownie, DO;  Location: [facility] ENDOSCOPY;  Service: Pulmonary;;   FINE NEEDLE ASPIRATION  10/17/2023   Procedure: FINE NEEDLE ASPIRATION;   Surgeon: Denson Flake, MD;  Location: [facility] ENDOSCOPY;  Service: Pulmonary;;   FOREIGN BODY REMOVAL  03/30/2022   Procedure: FOREIGN BODY REMOVAL;  Surgeon: Prudy Brownie, DO;  Location: [facility] ENDOSCOPY;  Service: Pulmonary;;   HEMOSTASIS CONTROL  10/17/2023   Procedure: HEMOSTASIS CONTROL;  Surgeon: Denson Flake, MD;  Location: [facility] ENDOSCOPY;  Service: Pulmonary;;   VIDEO BRONCHOSCOPY Bilateral 05/19/2024   Procedure: VIDEO BRONCHOSCOPY WITHOUT FLUORO;  Surgeon: Lind Repine, MD;  Location: [facility] ENDOSCOPY;  Service: Cardiopulmonary;  Laterality: Bilateral;   VIDEO BRONCHOSCOPY WITH ENDOBRONCHIAL ULTRASOUND Bilateral 03/30/2022   Procedure: VIDEO BRONCHOSCOPY WITH ENDOBRONCHIAL ULTRASOUND;  Surgeon: Prudy Brownie, DO;  Location: [facility] ENDOSCOPY;  Service: Pulmonary;  Laterality: Bilateral;   VIDEO BRONCHOSCOPY WITH ENDOBRONCHIAL ULTRASOUND Right 10/17/2023   Procedure: VIDEO BRONCHOSCOPY WITH ENDOBRONCHIAL ULTRASOUND;  Surgeon: Denson Flake, MD;  Location: [facility] ENDOSCOPY;  Service: Pulmonary;  Laterality: Right;   VIDEO BRONCHOSCOPY WITH RADIAL ENDOBRONCHIAL ULTRASOUND  03/30/2022   Procedure: VIDEO BRONCHOSCOPY WITH RADIAL ENDOBRONCHIAL ULTRASOUND;  Surgeon: Prudy Brownie, DO;  Location: [facility] ENDOSCOPY;  Service: Pulmonary;;    Family History  Problem Relation Age of Onset   Allergic rhinitis Sister  Asthma Sister    Angioedema Neg Hx    Atopy Neg Hx    Immunodeficiency Neg Hx    Urticaria Neg Hx    Eczema Neg Hx     Social History   Socioeconomic History   Marital status: Married    Spouse name: Not on file   Number of children: Not on file   Years of education: Not on file   Highest education level: Not on file  Occupational History   Not on file  Tobacco Use   Smoking status: Former    Current packs/day: 0.00    Types: Cigarettes    Quit date: 2024    Years since quitting: 1.4    Passive exposure: Past   Smokeless tobacco: Never  Vaping Use   Vaping status: Never Used   Substance and Sexual Activity   Alcohol use: No   Drug use: Never   Sexual activity: Not Currently  Other Topics Concern   Not on file  Social History Narrative   Not on file   Social Drivers of Health   Financial Resource Strain: Low Risk  (04/24/2024)   Received from [facility]   Overall Financial Resource Strain (CARDIA)    Difficulty of Paying Living Expenses: Not very hard  Food Insecurity: No Food Insecurity (05/18/2024)   Hunger Vital Sign    Worried About Running Out of Food in the Last Year: Never true    Ran Out of Food in the Last Year: Never true  Transportation Needs: No Transportation Needs (05/18/2024)   PRAPARE - Administrator, Civil Service (Medical): No    Lack of Transportation (Non-Medical): No  Physical Activity: Inactive (04/24/2024)   Received from [facility]   Exercise Vital Sign    Days of Exercise per Week: 0 days    Minutes of Exercise per Session: 20 min  Stress: Stress Concern Present (04/24/2024)   Received from [facility] - Occupational Stress Questionnaire    Feeling of Stress : Rather much  Social Connections: Socially Integrated (05/18/2024)   Social Connection and Isolation Panel [NHANES]    Frequency of Communication with Friends and Family: More than three times a week    Frequency of Social Gatherings with Friends and Family: Once a week    Attends Religious Services: 1 to 4 times per year    Active Member of [employer] or Organizations: No    Attends Banker Meetings: 1 to 4 times per year    Marital Status: Married  Catering manager Violence: Not At Risk (05/18/2024)   Humiliation, Afraid, Rape, and Kick questionnaire    Fear of Current or Ex-Partner: No    Emotionally Abused: No    Physically Abused: No    Sexually Abused: No    Review of Systems  Constitutional:  Positive for fatigue.  Respiratory:  Positive for cough and shortness of breath.     Vitals:    06/05/24 1023  BP: 139/87  Pulse: 95  SpO2: 96%     Physical Exam Constitutional:      Appearance: Normal appearance.  HENT:     Head: Normocephalic.     Mouth/Throat:     Mouth: Mucous membranes are moist.  Eyes:     General: No scleral icterus. Cardiovascular:     Rate and Rhythm: Normal rate and regular rhythm.     Heart sounds: No murmur heard.    No friction rub.  Pulmonary:     Effort: No respiratory distress.     Breath sounds: No stridor. No wheezing or rhonchi.  Musculoskeletal:     Cervical back: No rigidity or tenderness.  Neurological:     Mental Status: She is alert.  Psychiatric:        Mood and Affect: Mood normal.    Data Reviewed: Most recent CT scan 05/14/2024 reviewed showing extensive groundglass changes, emphysema adenopathy, traction bronchiectasis with peripheral honeycombing  Assessment:  History of non-small cell lung cancer adenocarcinoma of the right upper lobe with history of radiation treatment  History of pneumonitis for which she was recently treated with a course of steroids  Obstructive lung disease - On bronchodilators  Chronic respiratory failure - On oxygen supplementation  Plan/Recommendations: Continue current inhalers  Follow-up in about 2 months  Graded activity as tolerated  Continue oxygen at     Myer Artis MD [facility] 06/06/2024, 6:21 AM  CC: Claudell Cruz, MD

## 2024-06-07 ENCOUNTER — Inpatient Hospital Stay

## 2024-06-14 ENCOUNTER — Ambulatory Visit: Admitting: Internal Medicine

## 2024-06-25 MED ORDER — UMECLIDINIUM-VILANTEROL 62.5-25 MCG/ACT IN AEPB: 1.0000 | INHALATION_SPRAY | Freq: Every day | RESPIRATORY_TRACT | 5 refills | Status: DC

## 2024-06-28 ENCOUNTER — Ambulatory Visit (HOSPITAL_COMMUNITY)
Admission: RE | Admit: 2024-06-28 | Discharge: 2024-06-28 | Disposition: A | Discharge status: Home / Self Care | Source: Ambulatory Visit | Attending: Physician Assistant | Admitting: Physician Assistant

## 2024-06-28 DIAGNOSIS — C3431 Malignant neoplasm of lower lobe, right bronchus or lung: Secondary | ICD-10-CM | POA: Diagnosis present

## 2024-06-28 MED ORDER — SODIUM CHLORIDE (PF) 0.9 % IJ SOLN
INTRAMUSCULAR | Status: AC
  Filled 2024-06-28: qty 50

## 2024-06-28 MED ORDER — IOHEXOL 300 MG/ML  SOLN
75.0000 mL | Freq: Once | INTRAMUSCULAR | Status: AC | PRN
  Administered 2024-06-28: 75 mL via INTRAVENOUS

## 2024-07-02 ENCOUNTER — Telehealth: Payer: Self-pay | Admitting: Hematology and Oncology

## 2024-07-02 NOTE — Telephone Encounter (Signed)
 Rescheduled appointments with the patient. She Is aware of the changes made and confirmed appointment details.

## 2024-07-03 ENCOUNTER — Ambulatory Visit (HOSPITAL_BASED_OUTPATIENT_CLINIC_OR_DEPARTMENT_OTHER): Admitting: Hematology and Oncology

## 2024-07-03 ENCOUNTER — Encounter: Payer: Self-pay | Admitting: Hematology and Oncology

## 2024-07-03 ENCOUNTER — Inpatient Hospital Stay: Attending: Radiation Oncology

## 2024-07-03 VITALS — BP 131/60 | HR 86 | Temp 98.0°F | Resp 18 | Wt 153.2 lb

## 2024-07-03 DIAGNOSIS — Z7952 Long term (current) use of systemic steroids: Secondary | ICD-10-CM | POA: Diagnosis not present

## 2024-07-03 DIAGNOSIS — E119 Type 2 diabetes mellitus without complications: Secondary | ICD-10-CM | POA: Insufficient documentation

## 2024-07-03 DIAGNOSIS — K5900 Constipation, unspecified: Secondary | ICD-10-CM | POA: Insufficient documentation

## 2024-07-03 DIAGNOSIS — Z923 Personal history of irradiation: Secondary | ICD-10-CM | POA: Diagnosis not present

## 2024-07-03 DIAGNOSIS — Z7982 Long term (current) use of aspirin: Secondary | ICD-10-CM | POA: Diagnosis not present

## 2024-07-03 DIAGNOSIS — M129 Arthropathy, unspecified: Secondary | ICD-10-CM | POA: Diagnosis not present

## 2024-07-03 DIAGNOSIS — C3411 Malignant neoplasm of upper lobe, right bronchus or lung: Secondary | ICD-10-CM | POA: Diagnosis not present

## 2024-07-03 DIAGNOSIS — I700 Atherosclerosis of aorta: Secondary | ICD-10-CM | POA: Insufficient documentation

## 2024-07-03 DIAGNOSIS — J438 Other emphysema: Secondary | ICD-10-CM | POA: Diagnosis not present

## 2024-07-03 DIAGNOSIS — Z5112 Encounter for antineoplastic immunotherapy: Secondary | ICD-10-CM | POA: Diagnosis not present

## 2024-07-03 DIAGNOSIS — J432 Centrilobular emphysema: Secondary | ICD-10-CM | POA: Diagnosis not present

## 2024-07-03 DIAGNOSIS — Z7984 Long term (current) use of oral hypoglycemic drugs: Secondary | ICD-10-CM | POA: Diagnosis not present

## 2024-07-03 DIAGNOSIS — R0601 Orthopnea: Secondary | ICD-10-CM | POA: Diagnosis not present

## 2024-07-03 DIAGNOSIS — Z5111 Encounter for antineoplastic chemotherapy: Secondary | ICD-10-CM | POA: Insufficient documentation

## 2024-07-03 DIAGNOSIS — Z7189 Other specified counseling: Secondary | ICD-10-CM | POA: Diagnosis not present

## 2024-07-03 DIAGNOSIS — Z794 Long term (current) use of insulin: Secondary | ICD-10-CM | POA: Diagnosis not present

## 2024-07-03 DIAGNOSIS — C3431 Malignant neoplasm of lower lobe, right bronchus or lung: Secondary | ICD-10-CM | POA: Diagnosis not present

## 2024-07-03 DIAGNOSIS — Z79899 Other long term (current) drug therapy: Secondary | ICD-10-CM | POA: Diagnosis not present

## 2024-07-03 DIAGNOSIS — I10 Essential (primary) hypertension: Secondary | ICD-10-CM | POA: Diagnosis not present

## 2024-07-03 DIAGNOSIS — C3490 Malignant neoplasm of unspecified part of unspecified bronchus or lung: Secondary | ICD-10-CM

## 2024-07-03 DIAGNOSIS — Z9981 Dependence on supplemental oxygen: Secondary | ICD-10-CM | POA: Diagnosis not present

## 2024-07-03 LAB — CBC WITH DIFFERENTIAL (CANCER CENTER ONLY)
Abs Immature Granulocytes: 0.04 K/uL (ref 0.00–0.07)
Basophils Absolute: 0 K/uL (ref 0.0–0.1)
Basophils Relative: 0 %
Eosinophils Absolute: 0 K/uL (ref 0.0–0.5)
Eosinophils Relative: 0 %
HCT: 30.4 % — ABNORMAL LOW (ref 36.0–46.0)
Hemoglobin: 9.6 g/dL — ABNORMAL LOW (ref 12.0–15.0)
Immature Granulocytes: 0 %
Lymphocytes Relative: 9 %
Lymphs Abs: 0.8 K/uL (ref 0.7–4.0)
MCH: 26.2 pg (ref 26.0–34.0)
MCHC: 31.6 g/dL (ref 30.0–36.0)
MCV: 82.8 fL (ref 80.0–100.0)
Monocytes Absolute: 0.7 K/uL (ref 0.1–1.0)
Monocytes Relative: 7 %
Neutro Abs: 7.7 K/uL (ref 1.7–7.7)
Neutrophils Relative %: 84 %
Platelet Count: 435 K/uL — ABNORMAL HIGH (ref 150–400)
RBC: 3.67 MIL/uL — ABNORMAL LOW (ref 3.87–5.11)
RDW: 15.3 % (ref 11.5–15.5)
WBC Count: 9.3 K/uL (ref 4.0–10.5)
nRBC: 0.2 % (ref 0.0–0.2)

## 2024-07-03 LAB — CMP (CANCER CENTER ONLY)
ALT: <5 U/L (ref 0–44)
AST: 7 U/L — ABNORMAL LOW (ref 15–41)
Albumin: 3.4 g/dL — ABNORMAL LOW (ref 3.5–5.0)
Alkaline Phosphatase: 62 U/L (ref 38–126)
Anion gap: 8 (ref 5–15)
BUN: 23 mg/dL (ref 8–23)
CO2: 25 mmol/L (ref 22–32)
Calcium: 9.1 mg/dL (ref 8.9–10.3)
Chloride: 101 mmol/L (ref 98–111)
Creatinine: 0.6 mg/dL (ref 0.44–1.00)
GFR, Estimated: >60 mL/min (ref >60)
Glucose, Bld: 288 mg/dL — ABNORMAL HIGH (ref 70–99)
Potassium: 3.8 mmol/L (ref 3.5–5.1)
Sodium: 134 mmol/L — ABNORMAL LOW (ref 135–145)
Total Bilirubin: 0.2 mg/dL (ref 0.0–1.2)
Total Protein: 7.1 g/dL (ref 6.5–8.1)

## 2024-07-03 NOTE — Assessment & Plan Note (Addendum)
 This patient was originally diagnosed with lung cancer in 2023, status post radiation treatment.  Subsequently, she was found to have disease recurrence/progression in September 2024 Pathology squamous cell carcinoma, PD-L1 positive  She received concurrent chemoradiation therapy with carboplatin  and paclitaxel  with good response to treatment She started receiving durvalumab  as maintenance treatment, complicated by side effects/pneumonitis  Over the past few months, she had recurrent episodes of shortness of breath and cough and has been on multiple courses of corticosteroid therapy She was hospitalized in May with clinical suspicion for recurrent cancer although pathology was not conclusive when she presented with hemoptysis She has been oxygen dependent for several months Due to recent current cough and possible COPD exacerbation, she was placed on prednisone  since last week  I reviewed multiple imaging studies with the patient and her husband Unfortunately, CT imaging showed right lower lobe collapse/consolidation and contralateral lung nodule all suspicious for recurrent disease I recommend PET/CT imaging to complete staging I have reached out to her radiation oncologist to see if palliative radiation therapy can be offered The patient is very tearful We discussed the role of palliative care but she is not ready to accept palliative care yet As soon as PET/CT imaging is complete, she will be seen by her primary oncologist to discuss  palliative chemotherapy treatment

## 2024-07-03 NOTE — Assessment & Plan Note (Addendum)
 The patient is tearful and indicated she might not want aggressive treatment However, it appears that her husband is encouraging her to pursue aggressive treatment She has advanced directives and living will but has not completed the paperwork We discussed the role of palliative care referral but the patient declined

## 2024-07-03 NOTE — Progress Notes (Signed)
 [facility] OFFICE PROGRESS NOTE  Patient Care Team: [name], MD as PCP - General (Family Medicine) Prentis Duwaine BROCKS, RN as Oncology Nurse Navigator  Assessment & Plan Primary cancer of right lower lobe of lung [facility]) This patient was originally diagnosed with lung cancer in 2023, status post radiation treatment.  Subsequently, she was found to have disease recurrence/progression in September 2024 Pathology squamous cell carcinoma, PD-L1 positive  She received concurrent chemoradiation therapy with carboplatin  and paclitaxel  with good response to treatment She started receiving durvalumab  as maintenance treatment, complicated by side effects/pneumonitis  Over the past few months, she had recurrent episodes of shortness of breath and cough and has been on multiple courses of corticosteroid therapy She was hospitalized in May with clinical suspicion for recurrent cancer although pathology was not conclusive when she presented with hemoptysis She has been oxygen dependent for several months Due to recent current cough and possible COPD exacerbation, she was placed on prednisone  since last week  I reviewed multiple imaging studies with the patient and her husband Unfortunately, CT imaging showed right lower lobe collapse/consolidation and contralateral lung nodule all suspicious for recurrent disease I recommend PET/CT imaging to complete staging I have reached out to her radiation oncologist to see if palliative radiation therapy can be offered The patient is very tearful We discussed the role of palliative care but she is not ready to accept palliative care yet As soon as PET/CT imaging is complete, she will be seen by her primary oncologist to discuss  palliative chemotherapy treatment  Other emphysema (HCC) The most likely cause of her cough is due to malignancy There could be a component of COPD exacerbation She will continue prednisone  therapy as directed We  discussed the role of cough suppressant and the patient is okay not to pursue cough syrup right now Goals of care, counseling/discussion The patient is tearful and indicated she might not want aggressive treatment However, it appears that her husband is encouraging her to pursue aggressive treatment She has advanced directives and living will but has not completed the paperwork We discussed the role of palliative care referral but the patient declined  Orders Placed This Encounter  Procedures   NM PET Image Restage (PS) Skull Base to Thigh (F-18 FDG)    Standing Status:   Future    Expected Date:   07/10/2024    Expiration Date:   07/03/2025    If indicated for the ordered procedure, I authorize the administration of a radiopharmaceutical per Radiology protocol:   Yes    Preferred imaging location?:   [facility]    Radiology Contrast Protocol - do NOT remove file path:   [email or web address]     Almarie Bedford, MD  INTERVAL HISTORY: she returns for surveillance follow-up and review of CT imaging result I am covering her primary oncologist, Dr. Sherrod in his absence She is here accompanied by her husband I have reviewed her chart extensively The patient was seen by her pulmonologist and primary care doctor since discharge from the hospital in May During that hospitalization, she was found to have abnormal endobronchial lesion, likely source of her recurrent hemoptysis Pathology came back suspicious for malignancy Based on interaction with the patient, it does not appear she is aware of this She started to have worsening shortness of breath, cough and hemoptysis At night, she typically sleeps on her left side.  She has significant orthopnea Her energy level is poor I reviewed multiple imaging studies with  the patient and her husband and discussed the role of PET/CT imaging and possible referral to radiation oncologist to treat her hemoptysis and  lung collapse  PHYSICAL EXAMINATION: ECOG PERFORMANCE STATUS: 2 - Symptomatic, <50% confined to bed  Vitals:   07/03/24 1401  BP: 131/60  Pulse: 86  Resp: 18  Temp: 98 F (36.7 C)  SpO2: 97%   Filed Weights   07/03/24 1401  Weight: 153 lb 3.2 oz (69.5 kg)    Relevant data reviewed during this visit included CT imaging from May in comparison with July 2025, surgical record and pathology report

## 2024-07-03 NOTE — Assessment & Plan Note (Addendum)
 The most likely cause of her cough is due to malignancy There could be a component of COPD exacerbation She will continue prednisone  therapy as directed We discussed the role of cough suppressant and the patient is okay not to pursue cough syrup right now

## 2024-07-04 ENCOUNTER — Inpatient Hospital Stay: Admitting: Internal Medicine

## 2024-07-04 ENCOUNTER — Inpatient Hospital Stay

## 2024-07-05 ENCOUNTER — Telehealth: Payer: Self-pay | Admitting: Physician Assistant

## 2024-07-05 ENCOUNTER — Telehealth: Payer: Self-pay

## 2024-07-05 NOTE — Telephone Encounter (Signed)
 Rescheduled the appointment with the patient again due to sooner availability.

## 2024-07-05 NOTE — Telephone Encounter (Signed)
 Pt is scheduled for her PET 07/16/24. MD f/u scheduled with Dr Lonn for 07/24/24 at 0940. Pt is aware and agreeable.

## 2024-07-05 NOTE — Telephone Encounter (Signed)
 She is a patient of Dr. Sherrod, please notify Cassie and see when she can see her

## 2024-07-05 NOTE — Telephone Encounter (Signed)
 Rescheduled appointment with the patient and she confirmed.

## 2024-07-06 ENCOUNTER — Telehealth: Payer: Self-pay

## 2024-07-06 ENCOUNTER — Other Ambulatory Visit (HOSPITAL_COMMUNITY): Payer: Self-pay

## 2024-07-06 NOTE — Telephone Encounter (Signed)
*  Pulm  Pharmacy Patient Advocate Encounter   Received notification from CoverMyMeds that prior authorization for Umeclidinium-Vilanterol 62.5-25MCG/ACT aerosol powder  is required/requested.   Insurance verification completed.   The patient is insured through [employer] .   Per test claim: Refill too soon. PA is not needed at this time. Medication was filled 07/03. Next eligible fill date is 07/25.

## 2024-07-09 ENCOUNTER — Other Ambulatory Visit: Payer: Self-pay | Admitting: Physician Assistant

## 2024-07-12 NOTE — Progress Notes (Incomplete)
 Thoracic Location of Tumor / Histology: Primary cancer of right lower lobe of lung. Possibly considering palliative care.  Patient diagnosed with stage 3B non-small cell lung cancer in October 2024.   Biopsies- FINAL MICROSCOPIC DIAGNOSIS:  - Suspicious for malignancy  SPECIMEN ADEQUACY:  Satisfactory for evaluation  GROSS:  Received is/are 30 ccs of cloudy pink mucoid material. (CM:cm)  Prepared:  Smears: 2  Concentration Method (Thin Prep):  0  Cell Block:  0  Additional Studies:  n/a   Past/Anticipated interventions by pulmonary, if any:  Past/Anticipated interventions by cardiothoracic surgery, if any: 05/19/2024 Jude Harden GAILS, MD      Procedure Laterality Anesthesia  VIDEO BRONCHOSCOPY WITHOUT FLUORO Bilateral General  IRRIGATION, BRONCHUS     Past/Anticipated interventions by medical oncology, if any: 07/03/2024 Dr. Almarie Bedford Assessment & Plan Primary cancer of right lower lobe of lung [facility]) This patient was originally diagnosed with lung cancer in 2023, status post radiation treatment.  Subsequently, she was found to have disease recurrence/progression in September 2024 Pathology squamous cell carcinoma, PD-L1 positive She received concurrent chemoradiation therapy with carboplatin  and paclitaxel  with good response to treatment She started receiving durvalumab  as maintenance treatment, complicated by side effects/pneumonitis Over the past few months, she had recurrent episodes of shortness of breath and cough and has been on multiple courses of corticosteroid therapy She was hospitalized in May with clinical suspicion for recurrent cancer although pathology was not conclusive when she presented with hemoptysis She has been oxygen dependent for several months Due to recent current cough and possible COPD exacerbation, she was placed on prednisone  since last week I reviewed multiple imaging studies with the patient and her husband Unfortunately, CT imaging showed right lower  lobe collapse/consolidation and contralateral lung nodule all suspicious for recurrent disease I recommend PET/CT imaging to complete staging I have reached out to her radiation oncologist to see if palliative radiation therapy can be offered The patient is very tearful We discussed the role of palliative care but she is not ready to accept palliative care yet As soon as PET/CT imaging is complete, she will be seen by her primary oncologist to discuss  palliative chemotherapy treatment Other emphysema (HCC) The most likely cause of her cough is due to malignancy There could be a component of COPD exacerbation She will continue prednisone  therapy as directed We discussed the role of cough suppressant and the patient is okay not to pursue cough syrup right now Goals of care, counseling/discussion The patient is tearful and indicated she might not want aggressive treatment However, it appears that her husband is encouraging her to pursue aggressive treatment She has advanced directives and living will but has not completed the paperwork We discussed the role of palliative care referral but the patient declined.   Tobacco/Marijuana/Snuff/ETOH use: Former tobacco smoker- quit 2024  Signs/Symptoms Weight changes, if any: {:18581} Respiratory complaints, if any: {:18581} Hemoptysis, if any: {:18581} Pain issues, if any:  {:18581}  SAFETY ISSUES: Prior radiation? Yes- RT lung 2023 Pacemaker/ICD? No  Possible current pregnancy? No- Postmenopausal Is the patient on methotrexate? No  Current Complaints / other details:  ***   Vitals: ***  This concludes the interaction.  Rosaline Minerva, LPN

## 2024-07-14 NOTE — Progress Notes (Unsigned)
 [facility] OFFICE PROGRESS NOTE  Lindsey Luke POUR, MD [address] [place] [address]  DIAGNOSIS:  1) history of stage Ia non-small cell lung cancer, adenocarcinoma the right upper lobe who under went radiation under the care of Dr. Patrcia from 04/23/2022-04/29/22 2) Recurrent lung cancer, initially diagnosed as Stage IIIa confirm (T3, N2, M0) non-small cell lung cancer, squamous cell carcinoma. She presented with recurrent tumor at the right lung base, associated right lower lobe metastasis, and right hilar and subcarinal nodal metastases. This was diagnosed in October 2024.   PDL1: 85%   PRIOR THERAPY: 1) Radiation to the right upper lobe under the care of Dr. Patrcia from 04/23/2022-04/29/22  2) Concurrent chemoradiation with carboplatin  for an AUC of 2 and paclitaxel  45 mg/m.  Last dose on 12/20/23 Status post 7 cycle.  3) Consolidation immunotherapy with Imfinzi  1500 mg IV every 4 weeks, first dose expected on 02/07/2024. Discontinued after 1 cycle due to concern for pneumonitis  CURRENT THERAPY: ***  INTERVAL HISTORY: Lindsey Williamson 73 y.o. female returns to the clinic today for a follow up visit. The patient was last seen in the clinic by Dr. Lonn.   In summary, the patient diagnosed with stage III non-small cell lung cancer. She underwent a course of concurrent chemoradiation but she experienced radiation pneumonitis and some chronic interstitial lung disease. She underwent 1 cycle of immunotherapy with Imfinzi  but was hospitalized for cough and hypoxia and there was some concern for pneumonitis.   Therefore, her immunotherapy was discontinued and she was on observation. When she was seen in early June 2025, her scan showed increasing background groundglass opacity throughout the lungs bilaterally proximal possibly reflecting mild diffuse alveolar pulmonary edema. There was also new soft tissue within the bronchus intermedius and progressive right  hilar, subcarinal and lower right paratracheal adenopathy in addition to the postradiation changes within the posterior basal right lower lobe.   Dr. Sherrod recommended steroids and antibiotics and short interval follow up scan.   She then had repeat scan which showed ***. Therefore, Dr. Lonn ordered PET scan to further evaluate this.   In the interval since being seen, she denies any major changes in her health.. She continues to have dyspnea.   Today she denies any fever, chills, night sweats, or unexplained weight loss.  Her breathing is fair. Some days are better than others. She is wearing 3 L of oxygen via nasal cannula. She is doing PT.  She denies a significant cough just intermittent. She reports some days are better than others.  She denies any chest pain.  She denies any nausea, vomiting, or diarrhea. She does have constipation. She takes laxative. She denies any headache or visual changes. She is here for evaluation and to review her scan.     MEDICAL HISTORY: Past Medical History:  Diagnosis Date   Angioedema 10/17/2023   Arthritis    Diabetes mellitus without complication (HCC)    Hypertension     ALLERGIES:  is allergic to benzonatate, lisinopril, aspirin , azithromycin , and codeine .  MEDICATIONS:  Current Outpatient Medications  Medication Sig Dispense Refill   albuterol  (VENTOLIN  HFA) 108 (90 Base) MCG/ACT inhaler Inhale 2 puffs into the lungs every 6 (six) hours as needed for wheezing or shortness of breath. 6.7 g 0   amLODipine  (NORVASC ) 5 MG tablet Take 5 mg by mouth in the morning.     aspirin  EC 81 MG tablet Take 81 mg by mouth daily. Swallow whole.  bisoprolol  (ZEBETA ) 5 MG tablet Take 2.5 mg by mouth daily.     cetirizine  (ZYRTEC  ALLERGY ) 10 MG tablet Take 1 tablet (10 mg total) by mouth daily. 90 tablet 1   diclofenac Sodium (VOLTAREN) 1 % GEL Apply 1 Application topically daily as needed (pain).     empagliflozin  (JARDIANCE ) 25 MG TABS tablet Take 25 mg  by mouth daily at 6 PM.     ferrous sulfate  325 (65 FE) MG tablet Take 325 mg by mouth daily with breakfast.     glipiZIDE  (GLUCOTROL  XL) 5 MG 24 hr tablet Take 1 tablet (5 mg total) by mouth daily with breakfast. 30 tablet 0   insulin  aspart (NOVOLOG ) 100 UNIT/ML FlexPen Inject 0-11 Units into the skin 3 (three) times daily with meals. Check Blood Glucose (BG) and inject per scale: BG <150= 0 unit; BG 150-200= 1 unit; BG 201-250= 3 unit; BG 251-300= 5 unit; BG 301-350= 7 unit; BG 351-400= 9 unit; BG >400= 11 unit and Call Primary Care. (Patient taking differently: Inject 7-10 Units into the skin as needed for high blood sugar.) 15 mL 0   ipratropium-albuterol  (DUONEB) 0.5-2.5 (3) MG/3ML SOLN Take 3 mLs by nebulization in the morning and at bedtime. 360 mL 2   metFORMIN (GLUCOPHAGE-XR) 500 MG 24 hr tablet Take 1,000 mg by mouth in the morning and at bedtime.     omeprazole  (PRILOSEC) 20 MG capsule Take 20 mg by mouth daily.     polyethylene glycol (MIRALAX  / GLYCOLAX ) 17 g packet Take 17 g packet by mouth daily. (Patient taking differently: Take 17 g by mouth daily as needed for mild constipation or moderate constipation.) 14 each 0   pravastatin  (PRAVACHOL ) 80 MG tablet Take 80 mg by mouth at bedtime. (Patient not taking: Reported on 06/06/2024)     predniSONE  (DELTASONE ) 20 MG tablet Take 40 mg by mouth daily with breakfast.     umeclidinium-vilanterol (ANORO ELLIPTA ) 62.5-25 MCG/ACT AEPB Inhale 1 puff into the lungs daily. 14 each 6   umeclidinium-vilanterol (ANORO ELLIPTA ) 62.5-25 MCG/ACT AEPB Inhale 1 puff into the lungs daily. 90 each 5   No current facility-administered medications for this visit.    SURGICAL HISTORY:  Past Surgical History:  Procedure Laterality Date   BRONCHIAL BIOPSY  03/30/2022   Procedure: BRONCHIAL BIOPSIES;  Surgeon: Brenna Adine CROME, DO;  Location: [facility] ENDOSCOPY;  Service: Pulmonary;;   BRONCHIAL BIOPSY  10/17/2023   Procedure: BRONCHIAL BIOPSIES;  Surgeon: Shelah Lamar RAMAN, MD;  Location: [facility] ENDOSCOPY;  Service: Pulmonary;;   BRONCHIAL BRUSHINGS  03/30/2022   Procedure: BRONCHIAL BRUSHINGS;  Surgeon: Brenna Adine CROME, DO;  Location: [facility] ENDOSCOPY;  Service: Pulmonary;;   BRONCHIAL BRUSHINGS  10/17/2023   Procedure: BRONCHIAL BRUSHINGS;  Surgeon: Shelah Lamar RAMAN, MD;  Location: [facility] ENDOSCOPY;  Service: Pulmonary;;   BRONCHIAL NEEDLE ASPIRATION BIOPSY  03/30/2022   Procedure: BRONCHIAL NEEDLE ASPIRATION BIOPSIES;  Surgeon: Brenna Adine CROME, DO;  Location: [facility] ENDOSCOPY;  Service: Pulmonary;;   BRONCHIAL NEEDLE ASPIRATION BIOPSY  10/17/2023   Procedure: BRONCHIAL NEEDLE ASPIRATION BIOPSIES;  Surgeon: Shelah Lamar RAMAN, MD;  Location: [facility] ENDOSCOPY;  Service: Pulmonary;;   BRONCHIAL WASHINGS  05/19/2024   Procedure: IRRIGATION, BRONCHUS;  Surgeon: Jude Harden GAILS, MD;  Location: [facility] ENDOSCOPY;  Service: Cardiopulmonary;;   EYE SURGERY     FIDUCIAL MARKER PLACEMENT  03/30/2022   Procedure: FIDUCIAL MARKER PLACEMENT;  Surgeon: Brenna Adine CROME, DO;  Location: [facility] ENDOSCOPY;  Service: Pulmonary;;   FINE NEEDLE ASPIRATION  10/17/2023   Procedure: FINE NEEDLE ASPIRATION;  Surgeon: Shelah Lamar RAMAN, MD;  Location: [facility] ENDOSCOPY;  Service: Pulmonary;;   FOREIGN BODY REMOVAL  03/30/2022   Procedure: FOREIGN BODY REMOVAL;  Surgeon: Brenna Adine CROME, DO;  Location: [facility] ENDOSCOPY;  Service: Pulmonary;;   HEMOSTASIS CONTROL  10/17/2023   Procedure: HEMOSTASIS CONTROL;  Surgeon: Shelah Lamar RAMAN, MD;  Location: [facility] ENDOSCOPY;  Service: Pulmonary;;   VIDEO BRONCHOSCOPY Bilateral 05/19/2024   Procedure: VIDEO BRONCHOSCOPY WITHOUT FLUORO;  Surgeon: Jude Harden GAILS, MD;  Location: [facility] ENDOSCOPY;  Service: Cardiopulmonary;  Laterality: Bilateral;   VIDEO BRONCHOSCOPY WITH ENDOBRONCHIAL ULTRASOUND Bilateral 03/30/2022   Procedure: VIDEO BRONCHOSCOPY WITH ENDOBRONCHIAL ULTRASOUND;  Surgeon: Brenna Adine CROME, DO;  Location: [facility] ENDOSCOPY;  Service: Pulmonary;  Laterality: Bilateral;   VIDEO BRONCHOSCOPY  WITH ENDOBRONCHIAL ULTRASOUND Right 10/17/2023   Procedure: VIDEO BRONCHOSCOPY WITH ENDOBRONCHIAL ULTRASOUND;  Surgeon: Shelah Lamar RAMAN, MD;  Location: [facility] ENDOSCOPY;  Service: Pulmonary;  Laterality: Right;   VIDEO BRONCHOSCOPY WITH RADIAL ENDOBRONCHIAL ULTRASOUND  03/30/2022   Procedure: VIDEO BRONCHOSCOPY WITH RADIAL ENDOBRONCHIAL ULTRASOUND;  Surgeon: Brenna Adine CROME, DO;  Location: [facility] ENDOSCOPY;  Service: Pulmonary;;    REVIEW OF SYSTEMS:   Review of Systems  Constitutional: Negative for appetite change, chills, fatigue, fever and unexpected weight change.  HENT:   Negative for mouth sores, nosebleeds, sore throat and trouble swallowing.   Eyes: Negative for eye problems and icterus.  Respiratory: Negative for cough, hemoptysis, shortness of breath and wheezing.   Cardiovascular: Negative for chest pain and leg swelling.  Gastrointestinal: Negative for abdominal pain, constipation, diarrhea, nausea and vomiting.  Genitourinary: Negative for bladder incontinence, difficulty urinating, dysuria, frequency and hematuria.   Musculoskeletal: Negative for back pain, gait problem, neck pain and neck stiffness.  Skin: Negative for itching and rash.  Neurological: Negative for dizziness, extremity weakness, gait problem, headaches, light-headedness and seizures.  Hematological: Negative for adenopathy. Does not bruise/bleed easily.  Psychiatric/Behavioral: Negative for confusion, depression and sleep disturbance. The patient is not nervous/anxious.     PHYSICAL EXAMINATION:  There were no vitals taken for this visit.  ECOG PERFORMANCE STATUS: {CHL ONC ECOG [id number]  Physical Exam  Constitutional: Oriented to person, place, and time and well-developed, well-nourished, and in no distress. No distress.  HENT:  Head: Normocephalic and atraumatic.  Mouth/Throat: Oropharynx is clear and moist. No oropharyngeal exudate.  Eyes: Conjunctivae are normal. Right eye exhibits no discharge. Left eye  exhibits no discharge. No scleral icterus.  Neck: Normal range of motion. Neck supple.  Cardiovascular: Normal rate, regular rhythm, normal heart sounds and intact distal pulses.   Pulmonary/Chest: Effort normal and breath sounds normal. No respiratory distress. No wheezes. No rales.  Abdominal: Soft. Bowel sounds are normal. Exhibits no distension and no mass. There is no tenderness.  Musculoskeletal: Normal range of motion. Exhibits no edema.  Lymphadenopathy:    No cervical adenopathy.  Neurological: Alert and oriented to person, place, and time. Exhibits normal muscle tone. Gait normal. Coordination normal.  Skin: Skin is warm and dry. No rash noted. Not diaphoretic. No erythema. No pallor.  Psychiatric: Mood, memory and judgment normal.  Vitals reviewed.  LABORATORY DATA: Lab Results  Component Value Date   WBC 9.3 07/03/2024   HGB 9.6 (L) 07/03/2024   HCT 30.4 (L) 07/03/2024   MCV 82.8 07/03/2024   PLT 435 (H) 07/03/2024      Chemistry      Component Value Date/Time   NA 134 (L) 07/03/2024 1327  K 3.8 07/03/2024 1327   CL 101 07/03/2024 1327   CO2 25 07/03/2024 1327   BUN 23 07/03/2024 1327   CREATININE 0.60 07/03/2024 1327      Component Value Date/Time   CALCIUM 9.1 07/03/2024 1327   ALKPHOS 62 07/03/2024 1327   AST 7 (L) 07/03/2024 1327   ALT <5 07/03/2024 1327   BILITOT 0.2 07/03/2024 1327       RADIOGRAPHIC STUDIES:  CT Chest W Contrast Result Date: 06/28/2024 CLINICAL DATA:  Non-small-cell lung cancer. Adenocarcinoma. Restaging. * Tracking Code: BO * EXAM: CT CHEST WITH CONTRAST TECHNIQUE: Multidetector CT imaging of the chest was performed during intravenous contrast administration. RADIATION DOSE REDUCTION: This exam was performed according to the departmental dose-optimization program which includes automated exposure control, adjustment of the mA and/or kV according to patient size and/or use of iterative reconstruction technique. CONTRAST:  75mL  OMNIPAQUE  IOHEXOL  300 MG/ML  SOLN COMPARISON:  Chest CTA 05/14/2024.  Chest CT 04/04/2024. FINDINGS: Cardiovascular: The heart size is normal. No substantial pericardial effusion. Coronary artery calcification is evident. Mild atherosclerotic calcification is noted in the wall of the thoracic aorta. Enlargement of the pulmonary outflow tract/main pulmonary arteries suggests pulmonary arterial hypertension. Mediastinum/Nodes: Similar appearance 14 mm short axis precarinal lymph node. Bulky subcarinal lymph node measures 18 mm short axis, stable since 04/04/2024. Index right hilar lymph node measured previously at 15 mm short axis is 16 mm short axis today on 64/2. The esophagus has normal imaging features. There is no axillary lymphadenopathy. Lungs/Pleura: Centrilobular and paraseptal emphysema evident. Subpleural reticulation with traction bronchiectasis again noted in both lungs. Interval progression of collapse/consolidation in the right middle and lower lobes with the bronchus intermedius and right middle and lower lobe airways filled with tissue/debris on the current study (63/6). Heterogeneous enhancement in the parahilar right lung is suspicious for underlying disease progression with areas of possible necrosis. There is a new 2.4 x 2.2 cm nodule in the posterior left lower lobe (88/6) compared to 04/04/2024 which is progressive since the 05/14/2024 exam where only in retrospect is this nodule visible within an area of atelectasis, measuring 1.4 x 1.3 cm. Upper Abdomen: 17 mm right adrenal nodule again noted. Musculoskeletal: No worrisome lytic or sclerotic osseous abnormality. Superior endplate compression deformity at L1 is stable. IMPRESSION: 1. Interval progression of collapse/consolidation in the right middle and lower lobes with the bronchus intermedius and right middle and lower lobe airways filled with tissue/debris on the current study. Tumor invasion the airway is not excluded. Heterogeneous  enhancement in the parahilar right lung is suspicious for underlying disease progression with areas of possible necrosis. 2. New 2.4 x 2.2 cm nodule in the posterior left lower lobe compared to 04/04/2024 which is progressive since the 05/14/2024 exam where only in retrospect is this nodule visible within an area of dense atelectasis, measuring 1.4 x 1.3 cm. Imaging features are highly suspicious for metastatic disease. 3. Similar appearance of mediastinal and right hilar lymphadenopathy. 4. Stable 17 mm right adrenal nodule. 5. Enlargement of the pulmonary outflow tract/main pulmonary arteries suggests pulmonary arterial hypertension. 6. Aortic Atherosclerosis (ICD10-I70.0) and Emphysema (ICD10-J43.9). Electronically Signed   By: Camellia Candle M.D.   On: 06/28/2024 11:02     ASSESSMENT/PLAN:  1) history of stage Ia non-small cell lung cancer, adenocarcinoma the right upper lobe who under went radiation under the care of Dr. Patrcia from 04/23/2022-04/29/22 2 Recurrent lung cancer, initially diagnosed as Stage IIIa confirm (T3, N2, M0) non-small cell lung cancer, squamous  cell carcinoma. She presented with recurrent tumor at the right lung base, associated right lower lobe metastasis, and right hilar and subcarinal nodal metastases. This was diagnosed in October 2024.    PDL1 expression 85%.   The patient is scheduled to undergo consolidation immunotherapy with Imfinzi  1500 mg IV every 4 weeks . First dose on 02/07/24.  She is status post 1 cycle.  Her treatment was discontinued due to concerns with pneumonitis.   She was on observation but imaging studies showed new concerns. Therefore, she had a PET to further evaluate this.   The patient was seen with Dr. Sherrod today.  Dr. Sherrod personally and independently reviewed the scan and discussed results with the patient today.  The scan showed ***.  Dr. Sherrod recommends ***  PEF?. I will arrange for ***.   Dr. Sherrod had a lengthly discussion with  the patient today about her current condition and treatment options. The patient was given the option of a referral to hospice/palliative vs. Treatment with systemic chemotherapy with carboplatin  for an AUC of 5 and taxol  175 mg/m2. ***Keytruda 200 mg IV every 3 weeks.  The patient is interested in proceeding with systemic chemotherapy.  She is expected to start her first dose of this treatment on __.  We discussed the adverse side effects of treatment including but not limited to alopecia, myelosuppression, nausea and vomiting, peripheral neuropathy, liver or renal dysfunction.     I sent a prescription for Compazine  10 mg every 6 hours as needed for nausea.   The patient will follow-up in 2 weeks for a one-week follow-up visit after completing her first cycle of chemotherapy.  The patient was advised to call immediately if she has any concerning symptoms in the interval. The patient voices understanding of current disease status and treatment options and is in agreement with the current care plan. All questions were answered. The patient knows to call the clinic with any problems, questions or concerns. We can certainly see the patient much sooner if necessary  No orders of the defined types were placed in this encounter.    I spent {CHL ONC TIME VISIT - DTPQU:[id number]} counseling the patient face to face. The total time spent in the appointment was {CHL ONC TIME VISIT - DTPQU:[id number]}.  Thaxton Pelley L Delonna Ney, PA-C 07/14/24

## 2024-07-16 ENCOUNTER — Ambulatory Visit (HOSPITAL_COMMUNITY)
Admission: RE | Admit: 2024-07-16 | Discharge: 2024-07-16 | Disposition: A | Discharge status: Home / Self Care | Source: Ambulatory Visit | Attending: Hematology and Oncology | Admitting: Hematology and Oncology

## 2024-07-16 DIAGNOSIS — R918 Other nonspecific abnormal finding of lung field: Secondary | ICD-10-CM | POA: Insufficient documentation

## 2024-07-16 DIAGNOSIS — C3431 Malignant neoplasm of lower lobe, right bronchus or lung: Secondary | ICD-10-CM | POA: Insufficient documentation

## 2024-07-16 DIAGNOSIS — C3490 Malignant neoplasm of unspecified part of unspecified bronchus or lung: Secondary | ICD-10-CM | POA: Insufficient documentation

## 2024-07-16 LAB — GLUCOSE, CAPILLARY: Glucose-Capillary: 190 mg/dL — ABNORMAL HIGH (ref 70–99)

## 2024-07-16 MED ORDER — FLUDEOXYGLUCOSE F - 18 (FDG) INJECTION
7.5500 | Freq: Once | INTRAVENOUS | Status: AC
  Administered 2024-07-16: 7.55 via INTRAVENOUS

## 2024-07-17 ENCOUNTER — Inpatient Hospital Stay (HOSPITAL_BASED_OUTPATIENT_CLINIC_OR_DEPARTMENT_OTHER): Admitting: Physician Assistant

## 2024-07-17 VITALS — BP 138/96 | HR 99 | Temp 97.3°F | Resp 16 | Wt 149.0 lb

## 2024-07-17 DIAGNOSIS — Z7189 Other specified counseling: Secondary | ICD-10-CM | POA: Diagnosis not present

## 2024-07-17 DIAGNOSIS — Z5111 Encounter for antineoplastic chemotherapy: Secondary | ICD-10-CM | POA: Diagnosis not present

## 2024-07-17 DIAGNOSIS — C3431 Malignant neoplasm of lower lobe, right bronchus or lung: Secondary | ICD-10-CM | POA: Diagnosis not present

## 2024-07-17 MED ORDER — PROCHLORPERAZINE MALEATE 10 MG PO TABS: 10.0000 mg | ORAL_TABLET | Freq: Four times a day (QID) | ORAL | 2 refills | Status: DC | PRN

## 2024-07-17 NOTE — Patient Instructions (Signed)
-  There are two main categories of lung cancer, they are named based on the size of the cancer cell. One is called Non-Small cell lung cancer. The other type is Small Cell Lung Cancer -The sample (biopsy) that they took of your tumor was consistent with a subtype of Non-small cell lung cancer called Squamous Cell Carcinoma.  -We covered a lot of important information at your appointment today regarding what the treatment plan is moving forward. Here are the the main points that were discussed at your office visit with us  today:  -The treatment that you will receive consists of two chemotherapy drugs, called Carboplatin  and Paclitaxel  (also referred to as Taxol ) and one immunotherapy drug called Keytruda (pembrolizumab).  -We are planning on starting your treatment next week on 07/24/24 but before your start your treatment, I would like you to attend a Chemotherapy Education Class. This involves having you sit down with one of our nurse educators. She will discuss with you one-on-one more details about your treatment as well as general information about resources here at the cancer center.  -Your treatment will be given once every 3 weeks. We will check your labs once a week for the first ~5 treatments just to make sure that important components of your blood are in an acceptable range -You will need to return 2 days after your treatment to receive an injection. This injection is important because it boosts your infection fighting cells in your body and helps protect you from getting an infection.  -We will get a CT scan after 3 treatments to check on the progress of treatment  Medications:  -Compazine  was sent to your pharmacy. This medication is for nausea. You may take this every 6 hours as needed if you feel nausous.   Referrals or Imaging:   Follow up:  -We will see you back for a follow up visit in about 2 to see how your first treatment went and to make sure you are not having any side effects  from treatment.   If you need to contact our office, please do not hesitate, we are here to help. Our number is [phone]. When you call, ask to speak to Cassie's or Dr. Jeannett nurse.

## 2024-07-17 NOTE — Progress Notes (Addendum)
 DISCONTINUE ON PATHWAY REGIMEN - Non-Small Cell Lung     A cycle is every 28 days:     Durvalumab    **Always confirm dose/schedule in your pharmacy ordering system**  PRIOR TREATMENT: [id number]: Durvalumab  1,500 mg q28 Days x up to 12 Months  START ON PATHWAY REGIMEN - Non-Small Cell Lung     A cycle is every 21 days:     Pembrolizumab      Paclitaxel       Carboplatin    **Always confirm dose/schedule in your pharmacy ordering system**  Patient Characteristics: Stage IV Metastatic, Squamous, Molecular Analysis Not Elected, PS = 0, 1, Initial Chemotherapy/Immunotherapy, Immunotherapy Candidate, PD-L1 Expression Positive ? 50% (TPS) Therapeutic Status: Stage IV Metastatic Histology: Squamous Cell ECOG Performance Status: 1 Chemotherapy/Immunotherapy Line of Therapy: Initial Chemotherapy/Immunotherapy Immunotherapy Candidate Status: Candidate for Immunotherapy PD-L1 Expression Status: PD-L1 Positive ? 50% (TPS) Intent of Therapy: Non-Curative / Palliative Intent, Discussed with Patient

## 2024-07-18 ENCOUNTER — Other Ambulatory Visit: Payer: Self-pay

## 2024-07-19 ENCOUNTER — Telehealth: Payer: Self-pay | Admitting: Internal Medicine

## 2024-07-19 ENCOUNTER — Inpatient Hospital Stay: Admitting: Licensed Clinical Social Worker

## 2024-07-19 DIAGNOSIS — C3431 Malignant neoplasm of lower lobe, right bronchus or lung: Secondary | ICD-10-CM

## 2024-07-19 NOTE — Telephone Encounter (Signed)
Scheduled appointments with the patient

## 2024-07-19 NOTE — Progress Notes (Signed)
 [facility] CSW Progress Note  Clinical Child psychotherapist contacted patient by phone to follow-up on emotional support.    Interventions: Pt recently found to have progression and will be restarting chemotherapy.  CSW scheduled a counseling appointment for pt the first week of August and added pt to the lung cancer support group.  CSW to continue to provide support as appropriate throughout duration of treatment.        Follow Up Plan:  CSW will see patient on August 5th    Lindsey JONELLE Manna, LCSW Clinical Social Worker [facility]

## 2024-07-20 ENCOUNTER — Other Ambulatory Visit: Payer: Self-pay

## 2024-07-23 ENCOUNTER — Inpatient Hospital Stay

## 2024-07-23 VITALS — BP 145/84 | HR 92 | Temp 98.0°F | Resp 16

## 2024-07-23 DIAGNOSIS — C3431 Malignant neoplasm of lower lobe, right bronchus or lung: Secondary | ICD-10-CM

## 2024-07-23 DIAGNOSIS — Z5111 Encounter for antineoplastic chemotherapy: Secondary | ICD-10-CM | POA: Diagnosis not present

## 2024-07-23 LAB — CBC WITH DIFFERENTIAL (CANCER CENTER ONLY)
Abs Immature Granulocytes: 0.04 K/uL (ref 0.00–0.07)
Basophils Absolute: 0 K/uL (ref 0.0–0.1)
Basophils Relative: 0 %
Eosinophils Absolute: 0.1 K/uL (ref 0.0–0.5)
Eosinophils Relative: 1 %
HCT: 31 % — ABNORMAL LOW (ref 36.0–46.0)
Hemoglobin: 9.7 g/dL — ABNORMAL LOW (ref 12.0–15.0)
Immature Granulocytes: 1 %
Lymphocytes Relative: 11 %
Lymphs Abs: 0.9 K/uL (ref 0.7–4.0)
MCH: 25.8 pg — ABNORMAL LOW (ref 26.0–34.0)
MCHC: 31.3 g/dL (ref 30.0–36.0)
MCV: 82.4 fL (ref 80.0–100.0)
Monocytes Absolute: 0.8 K/uL (ref 0.1–1.0)
Monocytes Relative: 10 %
Neutro Abs: 6 K/uL (ref 1.7–7.7)
Neutrophils Relative %: 77 %
Platelet Count: 365 K/uL (ref 150–400)
RBC: 3.76 MIL/uL — ABNORMAL LOW (ref 3.87–5.11)
RDW: 15.7 % — ABNORMAL HIGH (ref 11.5–15.5)
WBC Count: 7.8 K/uL (ref 4.0–10.5)
nRBC: 0 % (ref 0.0–0.2)

## 2024-07-23 LAB — CMP (CANCER CENTER ONLY)
ALT: <5 U/L (ref 0–44)
AST: 8 U/L — ABNORMAL LOW (ref 15–41)
Albumin: 3.3 g/dL — ABNORMAL LOW (ref 3.5–5.0)
Alkaline Phosphatase: 67 U/L (ref 38–126)
Anion gap: 8 (ref 5–15)
BUN: 21 mg/dL (ref 8–23)
CO2: 26 mmol/L (ref 22–32)
Calcium: 9.3 mg/dL (ref 8.9–10.3)
Chloride: 105 mmol/L (ref 98–111)
Creatinine: 0.65 mg/dL (ref 0.44–1.00)
GFR, Estimated: >60 mL/min (ref >60)
Glucose, Bld: 186 mg/dL — ABNORMAL HIGH (ref 70–99)
Potassium: 3.3 mmol/L — ABNORMAL LOW (ref 3.5–5.1)
Sodium: 139 mmol/L (ref 135–145)
Total Bilirubin: 0.3 mg/dL (ref 0.0–1.2)
Total Protein: 7.3 g/dL (ref 6.5–8.1)

## 2024-07-23 LAB — TSH: TSH: 0.902 u[IU]/mL (ref 0.350–4.500)

## 2024-07-23 MED ORDER — SODIUM CHLORIDE 0.9 % IV SOLN
390.0000 mg | Freq: Once | INTRAVENOUS | Status: AC
  Administered 2024-07-23: 390 mg via INTRAVENOUS
  Filled 2024-07-23: qty 39

## 2024-07-23 MED ORDER — DEXAMETHASONE 4 MG PO TABS: ORAL_TABLET | ORAL | 1 refills | Status: DC

## 2024-07-23 MED ORDER — DEXAMETHASONE SODIUM PHOSPHATE 10 MG/ML IJ SOLN
10.0000 mg | Freq: Once | INTRAMUSCULAR | Status: DC
  Filled 2024-07-23: qty 1

## 2024-07-23 MED ORDER — SODIUM CHLORIDE 0.9 % IV SOLN
175.0000 mg/m2 | Freq: Once | INTRAVENOUS | Status: AC
  Administered 2024-07-23: 300 mg via INTRAVENOUS
  Filled 2024-07-23: qty 50

## 2024-07-23 MED ORDER — PROCHLORPERAZINE MALEATE 10 MG PO TABS: 10.0000 mg | ORAL_TABLET | Freq: Four times a day (QID) | ORAL | 1 refills | Status: DC | PRN

## 2024-07-23 MED ORDER — SODIUM CHLORIDE 0.9 % IV SOLN
INTRAVENOUS | Status: DC
Start: 2024-07-23 — End: 2024-07-23

## 2024-07-23 MED ORDER — APREPITANT 130 MG/18ML IV EMUL
130.0000 mg | Freq: Once | INTRAVENOUS | Status: AC
  Administered 2024-07-23: 130 mg via INTRAVENOUS
  Filled 2024-07-23: qty 18

## 2024-07-23 MED ORDER — LIDOCAINE-PRILOCAINE 2.5-2.5 % EX CREA: TOPICAL_CREAM | CUTANEOUS | 3 refills | Status: DC

## 2024-07-23 MED ORDER — FAMOTIDINE IN NACL 20-0.9 MG/50ML-% IV SOLN
20.0000 mg | Freq: Once | INTRAVENOUS | Status: AC
  Administered 2024-07-23: 20 mg via INTRAVENOUS
  Filled 2024-07-23: qty 50

## 2024-07-23 MED ORDER — PALONOSETRON HCL INJECTION 0.25 MG/5ML
0.2500 mg | Freq: Once | INTRAVENOUS | Status: AC
  Administered 2024-07-23: 0.25 mg via INTRAVENOUS
  Filled 2024-07-23: qty 5

## 2024-07-23 MED ORDER — ONDANSETRON HCL 8 MG PO TABS: 8.0000 mg | ORAL_TABLET | Freq: Three times a day (TID) | ORAL | 1 refills | Status: DC | PRN

## 2024-07-23 MED ORDER — DIPHENHYDRAMINE HCL 50 MG/ML IJ SOLN
50.0000 mg | Freq: Once | INTRAMUSCULAR | Status: AC
  Administered 2024-07-23: 50 mg via INTRAVENOUS
  Filled 2024-07-23: qty 1

## 2024-07-23 MED ORDER — SODIUM CHLORIDE 0.9 % IV SOLN
200.0000 mg | Freq: Once | INTRAVENOUS | Status: AC
  Administered 2024-07-23: 200 mg via INTRAVENOUS
  Filled 2024-07-23: qty 200

## 2024-07-23 NOTE — Progress Notes (Signed)
 RN confirmed the following steroid taper with patient: complete 30mg  dose today, then 20 mg x 7 days, then 10 mg x 7 days, and then provider will re-assess to determine if she needs to continue based on coughing.  Okay to proceed with Keytruda  per Cassie.

## 2024-07-23 NOTE — Progress Notes (Signed)
 Carboplatin  dose adjusted to AUC 5 and paclitaxel  dose adjusted to 175 mg/m2 per Cassie.

## 2024-07-23 NOTE — Patient Instructions (Signed)
 [facility] - A DEPT OF [facility]  Discharge Instructions: Thank you for choosing [facility] to provide your oncology and hematology care.   If you have a lab appointment with the Cancer Center, please go directly to the Cancer Center and check in at the registration area.   Wear comfortable clothing and clothing appropriate for easy access to any Portacath or PICC line.   We strive to give you quality time with your provider. You may need to reschedule your appointment if you arrive late (15 or more minutes).  Arriving late affects you and other patients whose appointments are after yours.  Also, if you miss three or more appointments without notifying the office, you may be dismissed from the clinic at the provider's discretion.      For prescription refill requests, have your pharmacy contact our office and allow 72 hours for refills to be completed.    Today you received the following chemotherapy and/or immunotherapy agents: Keytruda, Taxol, Carboplatin      To help prevent nausea and vomiting after your treatment, we encourage you to take your nausea medication as directed.  BELOW ARE SYMPTOMS THAT SHOULD BE REPORTED IMMEDIATELY: *FEVER GREATER THAN 100.4 F (38 C) OR HIGHER *CHILLS OR SWEATING *NAUSEA AND VOMITING THAT IS NOT CONTROLLED WITH YOUR NAUSEA MEDICATION *UNUSUAL SHORTNESS OF BREATH *UNUSUAL BRUISING OR BLEEDING *URINARY PROBLEMS (pain or burning when urinating, or frequent urination) *BOWEL PROBLEMS (unusual diarrhea, constipation, pain near the anus) TENDERNESS IN MOUTH AND THROAT WITH OR WITHOUT PRESENCE OF ULCERS (sore throat, sores in mouth, or a toothache) UNUSUAL RASH, SWELLING OR PAIN  UNUSUAL VAGINAL DISCHARGE OR ITCHING   Items with * indicate a potential emergency and should be followed up as soon as possible or go to the Emergency Department if any problems should occur.  Please show the CHEMOTHERAPY ALERT  CARD or IMMUNOTHERAPY ALERT CARD at check-in to the Emergency Department and triage nurse.  Should you have questions after your visit or need to cancel or reschedule your appointment, please contact [facility] - A DEPT OF [facility]  Dept: [phone]  and follow the prompts.  Office hours are 8:00 a.m. to 4:30 p.m. Monday - Friday. Please note that voicemails left after 4:00 p.m. may not be returned until the following business day.  We are closed weekends and major holidays. You have access to a nurse at all times for urgent questions. Please call the main number to the clinic Dept: [phone] and follow the prompts.   For any non-urgent questions, you may also contact your provider using MyChart. We now offer e-Visits for anyone 40 and older to request care online for non-urgent symptoms. For details visit [email or web address].   Also download the MyChart app! Go to the app store, search "MyChart", open the app, select [place], and log in with your MyChart username and password.

## 2024-07-24 ENCOUNTER — Ambulatory Visit: Admitting: Internal Medicine

## 2024-07-24 ENCOUNTER — Telehealth: Payer: Self-pay | Admitting: Radiation Oncology

## 2024-07-24 ENCOUNTER — Telehealth: Payer: Self-pay

## 2024-07-24 ENCOUNTER — Ambulatory Visit: Admitting: Hematology and Oncology

## 2024-07-24 LAB — T4: T4, Total: 8.6 ug/dL (ref 4.5–12.0)

## 2024-07-24 NOTE — Telephone Encounter (Signed)
-----   Message from Nurse Cortney P sent at 07/23/2024  3:19 PM EDT ----- Regarding: Dr Sherrod first time Keytruda  Dr Sherrod patient. She has had Taxol  / Carbo in the past, today she had Keytruda  added on. She tolerated all well, no complaints.

## 2024-07-24 NOTE — Telephone Encounter (Signed)
 7/29 Left voicemail with patient, appointment has been cancel for 7/31, no role for xrt -per Dr. Lorri.

## 2024-07-24 NOTE — Telephone Encounter (Signed)
 LM for patient that this nurse was calling to see how they were doing after their treatment. Please call back to Dr. Asa Lente nurse at [phone] if they have any questions or concerns regarding the treatment.

## 2024-07-25 ENCOUNTER — Inpatient Hospital Stay

## 2024-07-25 VITALS — BP 140/76 | HR 101 | Temp 97.7°F | Resp 17

## 2024-07-25 DIAGNOSIS — C3431 Malignant neoplasm of lower lobe, right bronchus or lung: Secondary | ICD-10-CM

## 2024-07-25 DIAGNOSIS — Z5111 Encounter for antineoplastic chemotherapy: Secondary | ICD-10-CM | POA: Diagnosis not present

## 2024-07-25 MED ORDER — PEGFILGRASTIM-FPGK 6 MG/0.6ML ~~LOC~~ SOSY
6.0000 mg | PREFILLED_SYRINGE | Freq: Once | SUBCUTANEOUS | Status: AC
Start: 2024-07-25 — End: 2024-07-25
  Administered 2024-07-25: 6 mg via SUBCUTANEOUS
  Filled 2024-07-25: qty 0.6

## 2024-07-25 NOTE — Progress Notes (Signed)
 [facility] OFFICE PROGRESS NOTE  Lindsey Williamson POUR, MD [address] [place] [address]  DIAGNOSIS:  1) history of stage Ia non-small cell lung cancer, adenocarcinoma the right upper lobe who under went radiation under the care of Dr. Patrcia from 04/23/2022-04/29/22 2) Recurrent lung cancer, initially diagnosed as Stage IIIa confirm (T3, N2, M0) non-small cell lung cancer, squamous cell carcinoma. She presented with recurrent tumor at the right lung base, associated right lower lobe metastasis, and right hilar and subcarinal nodal metastases. This was diagnosed in October 2024.   PDL1: 85%   PRIOR THERAPY: 1) Radiation to the right upper lobe under the care of Dr. Patrcia from 04/23/2022-04/29/22  2) Concurrent chemoradiation with carboplatin  for an AUC of 2 and paclitaxel  45 mg/m.  Last dose on 12/20/23 Status post 7 cycle.  3) Consolidation immunotherapy with Imfinzi  1500 mg IV every 4 weeks, first dose expected on 02/07/2024. Discontinued after 1 cycle due to concern for pneumonitis  CURRENT THERAPY: Palliative systemic chemotherapy with carboplatin  for an AUC of 5, paclitaxel  175 mg/m, Keytruda  200 mg IV every 3 weeks first dose on 07/23/2024.  She will also receive Neulasta  support.   INTERVAL HISTORY: Lindsey Williamson 73 y.o. female returns to the clinic today for a follow up visit accompanied by her husband. The patient was last seen in the clinic by Dr. Sherrod and myself on 07/17/24. The patient was recently found to have disease progression. Therefore, she started back on systemic chemotherapy and immunotherapy last week on 07/24/24 and tolerated it fairly well.   Her main issue was on Saturday, likely due to the neulasta  injection, she had muscle spasms in her back which affected her sleep.  She forgot to take Claritin , which she had previously been told to use for side effects.  She does feel like she is coughing a little more than before. Her sputum  was clear before but the last day and a half it is yellow and she is worried about pneumonia. She is still on 2 tablets of prednisone . She saw her PCP recently who adjusted her bisoprolol  to 2.5 mg BID. She did not take it this AM due to her appointment being too early today. She also states she saw her cardiologist. She denies history of arrhythmia.   She has dyspnea on exertion and wears supplemental oxygen at baseline. No fevers or chills, but she reports night sweats, which she attributes to her age. No hemoptysis.   No nausea or vomiting but reports a little diarrhea, may have been caused by her stool softener due to iron pill use, causing gassiness.  She is here for evaluation and a one week follow up visit to manage any adverse side effects of treatment.    MEDICAL HISTORY: Past Medical History:  Diagnosis Date   Angioedema 10/17/2023   Arthritis    Diabetes mellitus without complication (HCC)    Hypertension     ALLERGIES:  is allergic to benzonatate, lisinopril, aspirin , azithromycin , and codeine .  MEDICATIONS:  Current Outpatient Medications  Medication Sig Dispense Refill   albuterol  (VENTOLIN  HFA) 108 (90 Base) MCG/ACT inhaler Inhale 2 puffs into the lungs every 6 (six) hours as needed for wheezing or shortness of breath. 6.7 g 0   amLODipine  (NORVASC ) 5 MG tablet Take 5 mg by mouth in the morning.     aspirin  EC 81 MG tablet Take 81 mg by mouth daily. Swallow whole.     bisoprolol  (ZEBETA ) 5 MG  tablet Take 2.5 mg by mouth daily.     cetirizine  (ZYRTEC  ALLERGY ) 10 MG tablet Take 1 tablet (10 mg total) by mouth daily. 90 tablet 1   dexamethasone  (DECADRON ) 4 MG tablet Take 2 tablets (8mg ) by mouth daily starting the day after carboplatin  for 3 days. Take with food 30 tablet 1   diclofenac Sodium (VOLTAREN) 1 % GEL Apply 1 Application topically daily as needed (pain).     empagliflozin  (JARDIANCE ) 25 MG TABS tablet Take 25 mg by mouth daily at 6 PM.     ferrous sulfate  325  (65 FE) MG tablet Take 325 mg by mouth daily with breakfast.     glipiZIDE  (GLUCOTROL  XL) 5 MG 24 hr tablet Take 1 tablet (5 mg total) by mouth daily with breakfast. 30 tablet 0   insulin  aspart (NOVOLOG ) 100 UNIT/ML FlexPen Inject 0-11 Units into the skin 3 (three) times daily with meals. Check Blood Glucose (BG) and inject per scale: BG <150= 0 unit; BG 150-200= 1 unit; BG 201-250= 3 unit; BG 251-300= 5 unit; BG 301-350= 7 unit; BG 351-400= 9 unit; BG >400= 11 unit and Call Primary Care. (Patient taking differently: Inject 7-10 Units into the skin as needed for high blood sugar.) 15 mL 0   ipratropium-albuterol  (DUONEB) 0.5-2.5 (3) MG/3ML SOLN Take 3 mLs by nebulization in the morning and at bedtime. 360 mL 2   lidocaine -prilocaine  (EMLA ) cream Apply to affected area once 30 g 3   metFORMIN (GLUCOPHAGE-XR) 500 MG 24 hr tablet Take 1,000 mg by mouth in the morning and at bedtime.     omeprazole  (PRILOSEC) 20 MG capsule Take 20 mg by mouth daily.     ondansetron  (ZOFRAN ) 8 MG tablet Take 1 tablet (8 mg total) by mouth every 8 (eight) hours as needed for nausea or vomiting. Start on the third day after carboplatin . 30 tablet 1   polyethylene glycol (MIRALAX  / GLYCOLAX ) 17 g packet Take 17 g packet by mouth daily. (Patient taking differently: Take 17 g by mouth daily as needed for mild constipation or moderate constipation.) 14 each 0   pravastatin  (PRAVACHOL ) 80 MG tablet Take 80 mg by mouth at bedtime. (Patient not taking: Reported on 06/06/2024)     predniSONE  (DELTASONE ) 20 MG tablet Take 40 mg by mouth daily with breakfast.     prochlorperazine  (COMPAZINE ) 10 MG tablet Take 1 tablet (10 mg total) by mouth every 6 (six) hours as needed. 30 tablet 2   prochlorperazine  (COMPAZINE ) 10 MG tablet Take 1 tablet (10 mg total) by mouth every 6 (six) hours as needed for nausea or vomiting. 30 tablet 1   umeclidinium-vilanterol (ANORO ELLIPTA ) 62.5-25 MCG/ACT AEPB Inhale 1 puff into the lungs daily. 14 each 6    umeclidinium-vilanterol (ANORO ELLIPTA ) 62.5-25 MCG/ACT AEPB Inhale 1 puff into the lungs daily. 90 each 5   No current facility-administered medications for this visit.    SURGICAL HISTORY:  Past Surgical History:  Procedure Laterality Date   BRONCHIAL BIOPSY  03/30/2022   Procedure: BRONCHIAL BIOPSIES;  Surgeon: Brenna Adine CROME, DO;  Location: [facility] ENDOSCOPY;  Service: Pulmonary;;   BRONCHIAL BIOPSY  10/17/2023   Procedure: BRONCHIAL BIOPSIES;  Surgeon: Shelah Lamar RAMAN, MD;  Location: [facility] ENDOSCOPY;  Service: Pulmonary;;   BRONCHIAL BRUSHINGS  03/30/2022   Procedure: BRONCHIAL BRUSHINGS;  Surgeon: Brenna Adine CROME, DO;  Location: [facility] ENDOSCOPY;  Service: Pulmonary;;   BRONCHIAL BRUSHINGS  10/17/2023   Procedure: BRONCHIAL BRUSHINGS;  Surgeon: Shelah Lamar RAMAN, MD;  Location: [facility] ENDOSCOPY;  Service: Pulmonary;;   BRONCHIAL NEEDLE ASPIRATION BIOPSY  03/30/2022   Procedure: BRONCHIAL NEEDLE ASPIRATION BIOPSIES;  Surgeon: Brenna Adine CROME, DO;  Location: [facility] ENDOSCOPY;  Service: Pulmonary;;   BRONCHIAL NEEDLE ASPIRATION BIOPSY  10/17/2023   Procedure: BRONCHIAL NEEDLE ASPIRATION BIOPSIES;  Surgeon: Shelah Lamar RAMAN, MD;  Location: [facility] ENDOSCOPY;  Service: Pulmonary;;   BRONCHIAL WASHINGS  05/19/2024   Procedure: IRRIGATION, BRONCHUS;  Surgeon: Jude Harden GAILS, MD;  Location: [facility] ENDOSCOPY;  Service: Cardiopulmonary;;   EYE SURGERY     FIDUCIAL MARKER PLACEMENT  03/30/2022   Procedure: FIDUCIAL MARKER PLACEMENT;  Surgeon: Brenna Adine CROME, DO;  Location: [facility] ENDOSCOPY;  Service: Pulmonary;;   FINE NEEDLE ASPIRATION  10/17/2023   Procedure: FINE NEEDLE ASPIRATION;  Surgeon: Shelah Lamar RAMAN, MD;  Location: [facility] ENDOSCOPY;  Service: Pulmonary;;   FOREIGN BODY REMOVAL  03/30/2022   Procedure: FOREIGN BODY REMOVAL;  Surgeon: Brenna Adine CROME, DO;  Location: [facility] ENDOSCOPY;  Service: Pulmonary;;   HEMOSTASIS CONTROL  10/17/2023   Procedure: HEMOSTASIS CONTROL;  Surgeon: Shelah Lamar RAMAN, MD;  Location: [facility] ENDOSCOPY;   Service: Pulmonary;;   VIDEO BRONCHOSCOPY Bilateral 05/19/2024   Procedure: VIDEO BRONCHOSCOPY WITHOUT FLUORO;  Surgeon: Jude Harden GAILS, MD;  Location: [facility] ENDOSCOPY;  Service: Cardiopulmonary;  Laterality: Bilateral;   VIDEO BRONCHOSCOPY WITH ENDOBRONCHIAL ULTRASOUND Bilateral 03/30/2022   Procedure: VIDEO BRONCHOSCOPY WITH ENDOBRONCHIAL ULTRASOUND;  Surgeon: Brenna Adine CROME, DO;  Location: [facility] ENDOSCOPY;  Service: Pulmonary;  Laterality: Bilateral;   VIDEO BRONCHOSCOPY WITH ENDOBRONCHIAL ULTRASOUND Right 10/17/2023   Procedure: VIDEO BRONCHOSCOPY WITH ENDOBRONCHIAL ULTRASOUND;  Surgeon: Shelah Lamar RAMAN, MD;  Location: [facility] ENDOSCOPY;  Service: Pulmonary;  Laterality: Right;   VIDEO BRONCHOSCOPY WITH RADIAL ENDOBRONCHIAL ULTRASOUND  03/30/2022   Procedure: VIDEO BRONCHOSCOPY WITH RADIAL ENDOBRONCHIAL ULTRASOUND;  Surgeon: Brenna Adine CROME, DO;  Location: [facility] ENDOSCOPY;  Service: Pulmonary;;    REVIEW OF SYSTEMS:   Constitutional: Positive for fatigue. Negative for chills and fever. HENT: Negative for mouth sores, nosebleeds, sore throat and trouble swallowing.   Eyes: Negative for eye problems and icterus.  Respiratory: Positive for occasional cough and dyspnea on exertion. Negative for hemoptysis and wheezing.   Cardiovascular: Negative for chest pain and leg swelling.  Gastrointestinal: Negative for abdominal pain, diarrhea, nausea and vomiting.  Genitourinary: Negative for bladder incontinence, difficulty urinating, dysuria, frequency and hematuria.   Musculoskeletal: Positive for back pain. Negative for gait problem, neck pain and neck stiffness.  Skin: Negative for itching and rash.  Neurological: Negative for dizziness, extremity weakness, gait problem, headaches, light-headedness and seizures.  Hematological: Negative for adenopathy. Does not bruise/bleed easily.  Psychiatric/Behavioral: Negative for confusion, depression and sleep disturbance. The patient is not nervous/anxious.    PHYSICAL  EXAMINATION:  There were no vitals taken for this visit.  ECOG PERFORMANCE STATUS:   Physical Exam  Constitutional: Oriented to person, place, and time and well-developed, well-nourished, and in no distress.   HENT:  Head: Normocephalic and atraumatic.  Mouth/Throat: Oropharynx is clear and moist. No oropharyngeal exudate.  Eyes: Conjunctivae are normal. Right eye exhibits no discharge. Left eye exhibits no discharge. No scleral icterus.  Neck: Normal range of motion. Neck supple.  Cardiovascular: tachycardic, regular rhythm, normal heart sounds and intact distal pulses.   Pulmonary/Chest: Effort normal. Quiet breath sounds in the right lung. On supplemental oxygen. No respiratory distress. No wheezes. No rales.  Abdominal: Soft. Bowel sounds are normal. Exhibits no distension and no mass. There is no tenderness.  Musculoskeletal:  Normal range of motion. Exhibits no edema.  Lymphadenopathy:    No cervical adenopathy.  Neurological: Alert and oriented to person, place, and time. Exhibits normal muscle tone. Gait normal. Coordination normal.  Skin: Skin is warm and dry. No rash noted. Not diaphoretic. No erythema. No pallor.  Psychiatric: Mood, memory and judgment normal.  Vitals reviewed.  LABORATORY DATA: Lab Results  Component Value Date   WBC 7.8 07/23/2024   HGB 9.7 (L) 07/23/2024   HCT 31.0 (L) 07/23/2024   MCV 82.4 07/23/2024   PLT 365 07/23/2024      Chemistry      Component Value Date/Time   NA 139 07/23/2024 0839   K 3.3 (L) 07/23/2024 0839   CL 105 07/23/2024 0839   CO2 26 07/23/2024 0839   BUN 21 07/23/2024 0839   CREATININE 0.65 07/23/2024 0839      Component Value Date/Time   CALCIUM 9.3 07/23/2024 0839   ALKPHOS 67 07/23/2024 0839   AST 8 (L) 07/23/2024 0839   ALT <5 07/23/2024 0839   BILITOT 0.3 07/23/2024 0839       RADIOGRAPHIC STUDIES:  NM PET Image Restage (PS) Skull Base to Thigh (F-18 FDG) Result Date: 07/17/2024 CLINICAL DATA:  Subsequent  treatment strategy for non-small cell lung cancer, evaluate for recurrence. EXAM: NUCLEAR MEDICINE PET SKULL BASE TO THIGH TECHNIQUE: 7.55 mCi F-18 FDG was injected intravenously. Full-ring PET imaging was performed from the skull base to thigh after the radiotracer. CT data was obtained and used for attenuation correction and anatomic localization. Fasting blood glucose: 190 mg/dl COMPARISON:  CT chest June 28, 2024, PET-CT October 10, 2023 FINDINGS: Mediastinal blood pool activity: SUV max 2.5 Liver activity: SUV max 3.2 NECK: No hypermetabolic lymph nodes in the neck. Incidental CT findings: None. CHEST: Compared to prior PET-CT there is interval progression of right lower lobe malignancy within endobronchial extension and complete occlusion of right lower lobe bronchus. Primary right lower hypermetabolic malignancy currently measures 6 x 5.7 cm with max SUV 7, previously measured 5 x 2.6 cm with max is 8.7. There is a contralateral left lung base hypermetabolic nodule with max SUV 6.4 measuring 2.5 x 2.7 cm consistent with metastasis, previously measured 2.3 x 2.2 cm on CT and new to prior PET-CT. Multiple enlarging and now hypermetabolic right lower paratracheal and right suprahilar index lymph nodes are identified consistent with progressive metastatic nodal disease. Index node in right lower paratracheal measures 1.9 by 2.9 cm with max SUV 11, previously 1.2 x 1.9 cm with max SUV 3.1 on prior PET-CT. (Image 52/222). These findings are grossly stable to recent CT. Fiducial markers identified at the region of right lower lobe malignancy. Right-sided moderate pleural effusion stable to prior CT and new to prior PET-CT. 'Severe paraseptal emphysematous changes. The heart size is borderline normal. Atherosclerotic calcifications of coronary arteries and aorta. Trace pericardial fluid. ABDOMEN/PELVIS: No suspicious findings to suggest viable metastatic disease. Stable non FDG avid right adrenal nodule, benign versus  treated disease. Incidental CT findings: Cholelithiasis sigmoid diverticulosis without diverticulitis. Calcified uterine fibroid. Left kidney hypodense lesion without FDG uptake likely is small simple cyst incompletely assessed on current noncontrast CT. SKELETON: No suspicious findings to suggest osseous metastasis. Degenerative changes of the spine. Incidental CT findings: None. IMPRESSION: Obstructive right lower lobe hypermetabolic malignancy with contralateral left lower lobe metastasis (versus second primary), progressive ipsilateral hilar/mediastinal lymphadenopathy (N2) and complete atelectasis of right lower lobe. Overall findings are progressive to prior PET-CT and grossly similar to recent  chest CT. Electronically Signed   By: Megan  Zare M.D.   On: 07/17/2024 13:33   CT Chest W Contrast Result Date: 06/28/2024 CLINICAL DATA:  Non-small-cell lung cancer. Adenocarcinoma. Restaging. * Tracking Code: BO * EXAM: CT CHEST WITH CONTRAST TECHNIQUE: Multidetector CT imaging of the chest was performed during intravenous contrast administration. RADIATION DOSE REDUCTION: This exam was performed according to the departmental dose-optimization program which includes automated exposure control, adjustment of the mA and/or kV according to patient size and/or use of iterative reconstruction technique. CONTRAST:  75mL OMNIPAQUE  IOHEXOL  300 MG/ML  SOLN COMPARISON:  Chest CTA 05/14/2024.  Chest CT 04/04/2024. FINDINGS: Cardiovascular: The heart size is normal. No substantial pericardial effusion. Coronary artery calcification is evident. Mild atherosclerotic calcification is noted in the wall of the thoracic aorta. Enlargement of the pulmonary outflow tract/main pulmonary arteries suggests pulmonary arterial hypertension. Mediastinum/Nodes: Similar appearance 14 mm short axis precarinal lymph node. Bulky subcarinal lymph node measures 18 mm short axis, stable since 04/04/2024. Index right hilar lymph node measured  previously at 15 mm short axis is 16 mm short axis today on 64/2. The esophagus has normal imaging features. There is no axillary lymphadenopathy. Lungs/Pleura: Centrilobular and paraseptal emphysema evident. Subpleural reticulation with traction bronchiectasis again noted in both lungs. Interval progression of collapse/consolidation in the right middle and lower lobes with the bronchus intermedius and right middle and lower lobe airways filled with tissue/debris on the current study (63/6). Heterogeneous enhancement in the parahilar right lung is suspicious for underlying disease progression with areas of possible necrosis. There is a new 2.4 x 2.2 cm nodule in the posterior left lower lobe (88/6) compared to 04/04/2024 which is progressive since the 05/14/2024 exam where only in retrospect is this nodule visible within an area of atelectasis, measuring 1.4 x 1.3 cm. Upper Abdomen: 17 mm right adrenal nodule again noted. Musculoskeletal: No worrisome lytic or sclerotic osseous abnormality. Superior endplate compression deformity at L1 is stable. IMPRESSION: 1. Interval progression of collapse/consolidation in the right middle and lower lobes with the bronchus intermedius and right middle and lower lobe airways filled with tissue/debris on the current study. Tumor invasion the airway is not excluded. Heterogeneous enhancement in the parahilar right lung is suspicious for underlying disease progression with areas of possible necrosis. 2. New 2.4 x 2.2 cm nodule in the posterior left lower lobe compared to 04/04/2024 which is progressive since the 05/14/2024 exam where only in retrospect is this nodule visible within an area of dense atelectasis, measuring 1.4 x 1.3 cm. Imaging features are highly suspicious for metastatic disease. 3. Similar appearance of mediastinal and right hilar lymphadenopathy. 4. Stable 17 mm right adrenal nodule. 5. Enlargement of the pulmonary outflow tract/main pulmonary arteries suggests  pulmonary arterial hypertension. 6. Aortic Atherosclerosis (ICD10-I70.0) and Emphysema (ICD10-J43.9). Electronically Signed   By: Camellia Candle M.D.   On: 06/28/2024 11:02     ASSESSMENT/PLAN:  1) history of stage Ia non-small cell lung cancer, adenocarcinoma the right upper lobe who under went radiation under the care of Dr. Patrcia from 04/23/2022-04/29/22 2 Recurrent lung cancer, initially diagnosed as Stage IIIa confirm (T3, N2, M0) non-small cell lung cancer, squamous cell carcinoma. She presented with recurrent tumor at the right lung base, associated right lower lobe metastasis, and right hilar and subcarinal nodal metastases. This was diagnosed in October 2024.    PDL1 expression 85%.    The patient is scheduled to undergo consolidation immunotherapy with Imfinzi  1500 mg IV every 4 weeks . First dose  on 02/07/24.  She is status post 1 cycle.  Her treatment was discontinued due to concerns with pneumonitis.   She was found to have disease progresion in July 2025.   Therefore, Dr. Sherrod recommended palliative systemic chemotherapy with carboplatin  for an AUC of 5 and taxol  175 mg/m2 and Keytruda  200 mg IV every 3 weeks.  She is status post 1 cycle. She underwent cycle #1 on 07/23/24   Labs were reviewed.  Her pulse seemed fast on exam.  Therefore she had an EKG.  This was reviewed with Dr. Sherrod and showed sinus tachycardia with premature beats.  The patient is supposed be taking bisoprolol  missed her dose this morning.  Dr. Sherrod recommends hydrating with IV fluids today.  We will check to see if we are able to administer her missed bisoprolol  dose while in the clinic otherwise she was instructed to take this as soon as she returns home today and to monitor her pulse.  I asked the patient that should she ever have significant tachycardia with pulse greater than 130s despite taking her medication to seek emergency room evaluation.  She states that she recently saw her cardiologist and PCP.    Of note the patient is taking steroids.   I sent her prescription for doxycycline  due to her slight increase in her cough.  He was also advised to take Mucinex .  Will monitor her labs closely on a weekly basis due to her anemia which can be progressively worsening with her more chemo 2 doses of chemotherapy.  We also revisited her treatment plan which will include 4 cycles of carboplatin , Taxol , and Keytruda .  Starting from cycle #5 she will just be on maintenance immunotherapy.  She was strongly encouraged to use Claritin  to help mitigate the bone pains from the Neulasta  injection.  She was also advised that she may use not all arthritis.  We will see her back for labs and follow up in 2 weeks before undergoing cycle #2.   The patient was advised to call immediately if she has any concerning symptoms in the interval. The patient voices understanding of current disease status and treatment options and is in agreement with the current care plan. All questions were answered. The patient knows to call the clinic with any problems, questions or concerns. We can certainly see the patient much sooner if necessary   No orders of the defined types were placed in this encounter.    The total time spent in the appointment was 30-39 minutes  Susy Placzek L Cortne Amara, PA-C 07/25/24

## 2024-07-26 ENCOUNTER — Ambulatory Visit

## 2024-07-26 ENCOUNTER — Ambulatory Visit: Admitting: Urology

## 2024-07-30 ENCOUNTER — Inpatient Hospital Stay: Attending: Radiation Oncology

## 2024-07-30 ENCOUNTER — Telehealth: Payer: Self-pay | Admitting: Medical Oncology

## 2024-07-30 ENCOUNTER — Other Ambulatory Visit: Payer: Self-pay

## 2024-07-30 ENCOUNTER — Other Ambulatory Visit: Payer: Self-pay | Admitting: Physician Assistant

## 2024-07-30 ENCOUNTER — Inpatient Hospital Stay (HOSPITAL_BASED_OUTPATIENT_CLINIC_OR_DEPARTMENT_OTHER): Admitting: Physician Assistant

## 2024-07-30 ENCOUNTER — Inpatient Hospital Stay

## 2024-07-30 VITALS — BP 142/90 | HR 113 | Temp 97.6°F | Resp 19 | Ht 63.0 in | Wt 150.6 lb

## 2024-07-30 DIAGNOSIS — Z7982 Long term (current) use of aspirin: Secondary | ICD-10-CM | POA: Diagnosis not present

## 2024-07-30 DIAGNOSIS — C3411 Malignant neoplasm of upper lobe, right bronchus or lung: Secondary | ICD-10-CM | POA: Insufficient documentation

## 2024-07-30 DIAGNOSIS — Z9221 Personal history of antineoplastic chemotherapy: Secondary | ICD-10-CM | POA: Insufficient documentation

## 2024-07-30 DIAGNOSIS — Z7984 Long term (current) use of oral hypoglycemic drugs: Secondary | ICD-10-CM | POA: Insufficient documentation

## 2024-07-30 DIAGNOSIS — K5730 Diverticulosis of large intestine without perforation or abscess without bleeding: Secondary | ICD-10-CM | POA: Diagnosis not present

## 2024-07-30 DIAGNOSIS — C3431 Malignant neoplasm of lower lobe, right bronchus or lung: Secondary | ICD-10-CM | POA: Diagnosis not present

## 2024-07-30 DIAGNOSIS — K8020 Calculus of gallbladder without cholecystitis without obstruction: Secondary | ICD-10-CM | POA: Insufficient documentation

## 2024-07-30 DIAGNOSIS — Z79899 Other long term (current) drug therapy: Secondary | ICD-10-CM | POA: Diagnosis not present

## 2024-07-30 DIAGNOSIS — R000 Tachycardia, unspecified: Secondary | ICD-10-CM

## 2024-07-30 DIAGNOSIS — I700 Atherosclerosis of aorta: Secondary | ICD-10-CM | POA: Insufficient documentation

## 2024-07-30 DIAGNOSIS — Z794 Long term (current) use of insulin: Secondary | ICD-10-CM | POA: Insufficient documentation

## 2024-07-30 DIAGNOSIS — D649 Anemia, unspecified: Secondary | ICD-10-CM

## 2024-07-30 DIAGNOSIS — Z7952 Long term (current) use of systemic steroids: Secondary | ICD-10-CM | POA: Diagnosis not present

## 2024-07-30 DIAGNOSIS — Z923 Personal history of irradiation: Secondary | ICD-10-CM | POA: Diagnosis not present

## 2024-07-30 DIAGNOSIS — C7801 Secondary malignant neoplasm of right lung: Secondary | ICD-10-CM | POA: Diagnosis not present

## 2024-07-30 DIAGNOSIS — I2510 Atherosclerotic heart disease of native coronary artery without angina pectoris: Secondary | ICD-10-CM | POA: Insufficient documentation

## 2024-07-30 DIAGNOSIS — E119 Type 2 diabetes mellitus without complications: Secondary | ICD-10-CM | POA: Insufficient documentation

## 2024-07-30 DIAGNOSIS — J432 Centrilobular emphysema: Secondary | ICD-10-CM | POA: Diagnosis not present

## 2024-07-30 DIAGNOSIS — M129 Arthropathy, unspecified: Secondary | ICD-10-CM | POA: Diagnosis not present

## 2024-07-30 DIAGNOSIS — I10 Essential (primary) hypertension: Secondary | ICD-10-CM | POA: Diagnosis not present

## 2024-07-30 DIAGNOSIS — Z9981 Dependence on supplemental oxygen: Secondary | ICD-10-CM | POA: Insufficient documentation

## 2024-07-30 LAB — CMP (CANCER CENTER ONLY)
ALT: <5 U/L (ref 0–44)
AST: 9 U/L — ABNORMAL LOW (ref 15–41)
Albumin: 3.3 g/dL — ABNORMAL LOW (ref 3.5–5.0)
Alkaline Phosphatase: 79 U/L (ref 38–126)
Anion gap: 7 (ref 5–15)
BUN: 18 mg/dL (ref 8–23)
CO2: 26 mmol/L (ref 22–32)
Calcium: 9.4 mg/dL (ref 8.9–10.3)
Chloride: 103 mmol/L (ref 98–111)
Creatinine: 0.61 mg/dL (ref 0.44–1.00)
GFR, Estimated: >60 mL/min (ref >60)
Glucose, Bld: 191 mg/dL — ABNORMAL HIGH (ref 70–99)
Potassium: 3.4 mmol/L — ABNORMAL LOW (ref 3.5–5.1)
Sodium: 136 mmol/L (ref 135–145)
Total Bilirubin: 0.3 mg/dL (ref 0.0–1.2)
Total Protein: 6.7 g/dL (ref 6.5–8.1)

## 2024-07-30 LAB — CBC WITH DIFFERENTIAL (CANCER CENTER ONLY)
Abs Immature Granulocytes: 0.29 K/uL — ABNORMAL HIGH (ref 0.00–0.07)
Basophils Absolute: 0.1 K/uL (ref 0.0–0.1)
Basophils Relative: 1 %
Eosinophils Absolute: 0.1 K/uL (ref 0.0–0.5)
Eosinophils Relative: 1 %
HCT: 31.8 % — ABNORMAL LOW (ref 36.0–46.0)
Hemoglobin: 9.9 g/dL — ABNORMAL LOW (ref 12.0–15.0)
Immature Granulocytes: 3 %
Lymphocytes Relative: 9 %
Lymphs Abs: 1 K/uL (ref 0.7–4.0)
MCH: 25.4 pg — ABNORMAL LOW (ref 26.0–34.0)
MCHC: 31.1 g/dL (ref 30.0–36.0)
MCV: 81.7 fL (ref 80.0–100.0)
Monocytes Absolute: 0.8 K/uL (ref 0.1–1.0)
Monocytes Relative: 8 %
Neutro Abs: 8.3 K/uL — ABNORMAL HIGH (ref 1.7–7.7)
Neutrophils Relative %: 78 %
Platelet Count: 255 K/uL (ref 150–400)
RBC: 3.89 MIL/uL (ref 3.87–5.11)
RDW: 15.6 % — ABNORMAL HIGH (ref 11.5–15.5)
Smear Review: NORMAL
WBC Count: 10.7 K/uL — ABNORMAL HIGH (ref 4.0–10.5)
nRBC: 0 % (ref 0.0–0.2)

## 2024-07-30 LAB — SAMPLE TO BLOOD BANK

## 2024-07-30 MED ORDER — SODIUM CHLORIDE 0.9 % IV SOLN: Freq: Once | INTRAVENOUS | Status: AC

## 2024-07-30 MED ORDER — SODIUM CHLORIDE 0.9 % IV SOLN: Freq: Once | INTRAVENOUS | Status: DC

## 2024-07-30 MED ORDER — DOXYCYCLINE HYCLATE 100 MG PO TABS: 100.0000 mg | ORAL_TABLET | Freq: Two times a day (BID) | ORAL | 0 refills | Status: DC

## 2024-07-30 NOTE — Telephone Encounter (Signed)
 Cancelled doxycycline  rx at [facility]. Per Pt request I rerouted rx to [facility]

## 2024-07-30 NOTE — Patient Instructions (Signed)

## 2024-07-31 ENCOUNTER — Inpatient Hospital Stay: Admitting: Licensed Clinical Social Worker

## 2024-07-31 NOTE — Progress Notes (Signed)
 [facility] OFFICE PROGRESS NOTE  [name], MD [address] [place] [address]  DIAGNOSIS:  1) history of stage Ia non-small cell lung cancer, adenocarcinoma the right upper lobe who under went radiation under the care of Dr. Patrcia from 04/23/2022-04/29/22 2) Recurrent lung cancer, initially diagnosed as Stage IIIa confirm (T3, N2, M0) non-small cell lung cancer, squamous cell carcinoma. She presented with recurrent tumor at the right lung base, associated right lower lobe metastasis, and right hilar and subcarinal nodal metastases. This was diagnosed in October 2024.   PDL1: 85%   PRIOR THERAPY: 1) Radiation to the right upper lobe under the care of Dr. Patrcia from 04/23/2022-04/29/22  2) Concurrent chemoradiation with carboplatin  for an AUC of 2 and paclitaxel  45 mg/m.  Last dose on 12/20/23 Status post 7 cycle.  3) Consolidation immunotherapy with Imfinzi  1500 mg IV every 4 weeks, first dose expected on 02/07/2024. Discontinued after 1 cycle due to concern for pneumonitis  CURRENT THERAPY: Palliative systemic chemotherapy with carboplatin  for an AUC of 5, paclitaxel  175 mg/m, Keytruda  200 mg IV every 3 weeks first dose on 07/23/2024.  She will also receive Neulasta  support. She is status post 1 cycle.   INTERVAL HISTORY: NIZA SODERHOLM 73 y.o. female returns to the clinic today for a follow up visit accompanied by her husband. The patient was last seen in the clinic by myself on 07/30/24 for a one week follow up toxicity check.   In the interval since last being seen, she is having some new symptoms.  She reports increased shortness of breath compared to her normal baseline shortness of breath for which she is on supplemental oxygen.  She also reports worsening of her baseline cough.  This is disrupting her sleep and causing fatigue.  Also has caused her some pain in her back with coughing.  Her pain is subsided with Tylenol  and heating pads.  She is  also tried drinking teas.  Her pulmonologist is expected to see her on 08/22/2024.  They did call her in a prescription for Hydromet which the patient states is not effective.  The only cough medicine that was semieffective for her was codeine /guaifenesin  she was prescribed during her May 2025 hospitalization.  She has been having some intermittent blood-tinged sputum on and off which is not new for her but it has been occurring the last 2 days.  He was given doxycycline  a few weeks ago which did not improve her cough.  She fails the most similar to when she had pneumonia in the past.  He also has some upper abdominal discomfort with coughing.  She denies any constipation as she takes stool softeners.  She denies any diarrhea, nausea, vomiting, or changes in her bowel habits.  She reports increased gassiness that is not relieved with Gas-X.   No fever, epistaxis, or changes in her usual night sweats. She denies rashes, headaches, or vision changes. She reports a loss of appetite and feels weak and tired. Her breathing feels similar to when she had pneumonia in the past.  She completed a course of antibiotics two weeks ago, which did not alleviate her symptoms. She is concerned about the impact of her symptoms on daily activities, such as needing to sit down while showering due to weakness.  She is here today for evaluation repeat blood work before undergoing cycle #2.  MEDICAL HISTORY: Past Medical History:  Diagnosis Date   Angioedema 10/17/2023   Arthritis  Diabetes mellitus without complication (HCC)    Hypertension     ALLERGIES:  is allergic to benzonatate, lisinopril, aspirin , azithromycin , and codeine .  MEDICATIONS:  Current Outpatient Medications  Medication Sig Dispense Refill   albuterol  (VENTOLIN  HFA) 108 (90 Base) MCG/ACT inhaler Inhale 2 puffs into the lungs every 6 (six) hours as needed for wheezing or shortness of breath. 20.1 g 2   amLODipine  (NORVASC ) 5 MG tablet Take 5 mg  by mouth in the morning.     aspirin  EC 81 MG tablet Take 81 mg by mouth daily. Swallow whole.     bisoprolol  (ZEBETA ) 5 MG tablet Take 2.5 mg by mouth daily.     cetirizine  (ZYRTEC  ALLERGY ) 10 MG tablet Take 1 tablet (10 mg total) by mouth daily. 90 tablet 1   dexamethasone  (DECADRON ) 4 MG tablet Take 2 tablets (8mg ) by mouth daily starting the day after carboplatin  for 3 days. Take with food 30 tablet 1   diclofenac Sodium (VOLTAREN) 1 % GEL Apply 1 Application topically daily as needed (pain).     doxycycline  (VIBRA -TABS) 100 MG tablet Take 1 tablet (100 mg total) by mouth 2 (two) times daily. 14 tablet 0   empagliflozin  (JARDIANCE ) 25 MG TABS tablet Take 25 mg by mouth daily at 6 PM.     ferrous sulfate  325 (65 FE) MG tablet Take 325 mg by mouth daily with breakfast.     glipiZIDE  (GLUCOTROL  XL) 5 MG 24 hr tablet Take 1 tablet (5 mg total) by mouth daily with breakfast. 30 tablet 0   guaiFENesin -codeine  100-10 MG/5ML syrup Take 5 mLs by mouth 3 (three) times daily as needed for cough. 120 mL 0   insulin  aspart (NOVOLOG ) 100 UNIT/ML FlexPen Inject 0-11 Units into the skin 3 (three) times daily with meals. Check Blood Glucose (BG) and inject per scale: BG <150= 0 unit; BG 150-200= 1 unit; BG 201-250= 3 unit; BG 251-300= 5 unit; BG 301-350= 7 unit; BG 351-400= 9 unit; BG >400= 11 unit and Call Primary Care. 15 mL 0   ipratropium-albuterol  (DUONEB) 0.5-2.5 (3) MG/3ML SOLN Take 3 mLs by nebulization in the morning and at bedtime. 360 mL 2   levofloxacin  (LEVAQUIN ) 500 MG tablet Take 1 tablet (500 mg total) by mouth daily. 7 tablet 0   lidocaine -prilocaine  (EMLA ) cream Apply to affected area once 30 g 3   metFORMIN (GLUCOPHAGE-XR) 500 MG 24 hr tablet Take 1,000 mg by mouth in the morning and at bedtime.     omeprazole  (PRILOSEC) 20 MG capsule Take 20 mg by mouth daily.     ondansetron  (ZOFRAN ) 8 MG tablet Take 1 tablet (8 mg total) by mouth every 8 (eight) hours as needed for nausea or vomiting. Start  on the third day after carboplatin . 30 tablet 1   polyethylene glycol (MIRALAX  / GLYCOLAX ) 17 g packet Take 17 g packet by mouth daily. 14 each 0   potassium chloride  SA (KLOR-CON  M) 20 MEQ tablet Take 1 tablet (20 mEq total) by mouth daily. 5 tablet 0   pravastatin  (PRAVACHOL ) 80 MG tablet Take 80 mg by mouth at bedtime.     predniSONE  (DELTASONE ) 20 MG tablet Take 40 mg by mouth daily with breakfast.     prochlorperazine  (COMPAZINE ) 10 MG tablet Take 1 tablet (10 mg total) by mouth every 6 (six) hours as needed. 30 tablet 2   prochlorperazine  (COMPAZINE ) 10 MG tablet Take 1 tablet (10 mg total) by mouth every 6 (six) hours as needed for nausea  or vomiting. 30 tablet 1   umeclidinium-vilanterol (ANORO ELLIPTA ) 62.5-25 MCG/ACT AEPB Inhale 1 puff into the lungs daily. 14 each 6   No current facility-administered medications for this visit.    SURGICAL HISTORY:  Past Surgical History:  Procedure Laterality Date   BRONCHIAL BIOPSY  03/30/2022   Procedure: BRONCHIAL BIOPSIES;  Surgeon: Brenna Adine CROME, DO;  Location: [facility] ENDOSCOPY;  Service: Pulmonary;;   BRONCHIAL BIOPSY  10/17/2023   Procedure: BRONCHIAL BIOPSIES;  Surgeon: Shelah Lamar RAMAN, MD;  Location: [facility] ENDOSCOPY;  Service: Pulmonary;;   BRONCHIAL BRUSHINGS  03/30/2022   Procedure: BRONCHIAL BRUSHINGS;  Surgeon: Brenna Adine CROME, DO;  Location: [facility] ENDOSCOPY;  Service: Pulmonary;;   BRONCHIAL BRUSHINGS  10/17/2023   Procedure: BRONCHIAL BRUSHINGS;  Surgeon: Shelah Lamar RAMAN, MD;  Location: [facility] ENDOSCOPY;  Service: Pulmonary;;   BRONCHIAL NEEDLE ASPIRATION BIOPSY  03/30/2022   Procedure: BRONCHIAL NEEDLE ASPIRATION BIOPSIES;  Surgeon: Brenna Adine CROME, DO;  Location: [facility] ENDOSCOPY;  Service: Pulmonary;;   BRONCHIAL NEEDLE ASPIRATION BIOPSY  10/17/2023   Procedure: BRONCHIAL NEEDLE ASPIRATION BIOPSIES;  Surgeon: Shelah Lamar RAMAN, MD;  Location: [facility] ENDOSCOPY;  Service: Pulmonary;;   BRONCHIAL WASHINGS  05/19/2024   Procedure: IRRIGATION, BRONCHUS;   Surgeon: Jude Harden GAILS, MD;  Location: [facility] ENDOSCOPY;  Service: Cardiopulmonary;;   EYE SURGERY     FIDUCIAL MARKER PLACEMENT  03/30/2022   Procedure: FIDUCIAL MARKER PLACEMENT;  Surgeon: Brenna Adine CROME, DO;  Location: [facility] ENDOSCOPY;  Service: Pulmonary;;   FINE NEEDLE ASPIRATION  10/17/2023   Procedure: FINE NEEDLE ASPIRATION;  Surgeon: Shelah Lamar RAMAN, MD;  Location: [facility] ENDOSCOPY;  Service: Pulmonary;;   FOREIGN BODY REMOVAL  03/30/2022   Procedure: FOREIGN BODY REMOVAL;  Surgeon: Brenna Adine CROME, DO;  Location: [facility] ENDOSCOPY;  Service: Pulmonary;;   HEMOSTASIS CONTROL  10/17/2023   Procedure: HEMOSTASIS CONTROL;  Surgeon: Shelah Lamar RAMAN, MD;  Location: [facility] ENDOSCOPY;  Service: Pulmonary;;   VIDEO BRONCHOSCOPY Bilateral 05/19/2024   Procedure: VIDEO BRONCHOSCOPY WITHOUT FLUORO;  Surgeon: Jude Harden GAILS, MD;  Location: [facility] ENDOSCOPY;  Service: Cardiopulmonary;  Laterality: Bilateral;   VIDEO BRONCHOSCOPY WITH ENDOBRONCHIAL ULTRASOUND Bilateral 03/30/2022   Procedure: VIDEO BRONCHOSCOPY WITH ENDOBRONCHIAL ULTRASOUND;  Surgeon: Brenna Adine CROME, DO;  Location: [facility] ENDOSCOPY;  Service: Pulmonary;  Laterality: Bilateral;   VIDEO BRONCHOSCOPY WITH ENDOBRONCHIAL ULTRASOUND Right 10/17/2023   Procedure: VIDEO BRONCHOSCOPY WITH ENDOBRONCHIAL ULTRASOUND;  Surgeon: Shelah Lamar RAMAN, MD;  Location: [facility] ENDOSCOPY;  Service: Pulmonary;  Laterality: Right;   VIDEO BRONCHOSCOPY WITH RADIAL ENDOBRONCHIAL ULTRASOUND  03/30/2022   Procedure: VIDEO BRONCHOSCOPY WITH RADIAL ENDOBRONCHIAL ULTRASOUND;  Surgeon: Brenna Adine CROME, DO;  Location: [facility] ENDOSCOPY;  Service: Pulmonary;;    REVIEW OF SYSTEMS:   Constitutional: Positive for fatigue. Negative for chills and fever. HENT: Negative for mouth sores, nosebleeds, sore throat and trouble swallowing.   Eyes: Negative for eye problems and icterus.  Respiratory: Positive for cough and dyspnea on exertion. Positive for blood tinged mucus production. Negative for wheezing.    Cardiovascular: Negative for chest pain and leg swelling.  Gastrointestinal: Positive for abdominal discomfort.  Negative for diarrhea, nausea and vomiting.  Genitourinary: Negative for bladder incontinence, difficulty urinating, dysuria, frequency and hematuria.   Musculoskeletal: Positive for back pain. Negative for gait problem, neck pain and neck stiffness.  Skin: Negative for itching and rash.  Neurological: Negative for dizziness, extremity weakness, gait problem, headaches, light-headedness and seizures.  Hematological: Negative for adenopathy. Does not bruise/bleed easily.  Psychiatric/Behavioral: Negative for confusion, depression and  sleep disturbance. The patient is not nervous/anxious.     PHYSICAL EXAMINATION:  Blood pressure (!) 146/84, pulse (!) 107, temperature (!) 97 F (36.1 C), temperature source Temporal, resp. rate 18, SpO2 95%.  ECOG PERFORMANCE STATUS: 2  Physical Exam  Constitutional: Oriented to person, place, and time and chronically ill appearing female well-nourished, and in no distress.   HENT:  Head: Normocephalic and atraumatic.  Mouth/Throat: Oropharynx is clear and moist. No oropharyngeal exudate.  Eyes: Conjunctivae are normal. Right eye exhibits no discharge. Left eye exhibits no discharge. No scleral icterus.  Neck: Normal range of motion. Neck supple.  Cardiovascular: tachycardic, regular rhythm, normal heart sounds and intact distal pulses.   Pulmonary/Chest: Effort normal. Quiet breath sounds in the right lung. On supplemental oxygen. No respiratory distress. No wheezes. No rales.  Abdominal: Soft. Bowel sounds are normal. Exhibits no distension and no mass. There is no tenderness.  Musculoskeletal: Normal range of motion. Exhibits no edema.  Lymphadenopathy:    No cervical adenopathy.  Neurological: Alert and oriented to person, place, and time. Exhibits muscle wasting. Examined in the wheelchair.  Skin: Skin is warm and dry. No rash noted. Not  diaphoretic. No erythema. No pallor.  Psychiatric: Mood, memory and judgment normal.  Vitals reviewed.  LABORATORY DATA: Lab Results  Component Value Date   WBC 10.9 (H) 08/13/2024   HGB 10.0 (L) 08/13/2024   HCT 32.1 (L) 08/13/2024   MCV 83.4 08/13/2024   PLT 440 (H) 08/13/2024      Chemistry      Component Value Date/Time   NA 136 08/13/2024 0843   K 3.7 08/13/2024 0843   CL 102 08/13/2024 0843   CO2 20 (L) 08/13/2024 0843   BUN 10 08/13/2024 0843   CREATININE 0.59 08/13/2024 0843      Component Value Date/Time   CALCIUM 9.1 08/13/2024 0843   ALKPHOS 87 08/13/2024 0843   AST 11 (L) 08/13/2024 0843   ALT <5 08/13/2024 0843   BILITOT 0.3 08/13/2024 0843       RADIOGRAPHIC STUDIES:  NM PET Image Restage (PS) Skull Base to Thigh (F-18 FDG) Result Date: 07/17/2024 CLINICAL DATA:  Subsequent treatment strategy for non-small cell lung cancer, evaluate for recurrence. EXAM: NUCLEAR MEDICINE PET SKULL BASE TO THIGH TECHNIQUE: 7.55 mCi F-18 FDG was injected intravenously. Full-ring PET imaging was performed from the skull base to thigh after the radiotracer. CT data was obtained and used for attenuation correction and anatomic localization. Fasting blood glucose: 190 mg/dl COMPARISON:  CT chest June 28, 2024, PET-CT October 10, 2023 FINDINGS: Mediastinal blood pool activity: SUV max 2.5 Liver activity: SUV max 3.2 NECK: No hypermetabolic lymph nodes in the neck. Incidental CT findings: None. CHEST: Compared to prior PET-CT there is interval progression of right lower lobe malignancy within endobronchial extension and complete occlusion of right lower lobe bronchus. Primary right lower hypermetabolic malignancy currently measures 6 x 5.7 cm with max SUV 7, previously measured 5 x 2.6 cm with max is 8.7. There is a contralateral left lung base hypermetabolic nodule with max SUV 6.4 measuring 2.5 x 2.7 cm consistent with metastasis, previously measured 2.3 x 2.2 cm on CT and new to prior  PET-CT. Multiple enlarging and now hypermetabolic right lower paratracheal and right suprahilar index lymph nodes are identified consistent with progressive metastatic nodal disease. Index node in right lower paratracheal measures 1.9 by 2.9 cm with max SUV 11, previously 1.2 x 1.9 cm with max SUV 3.1 on prior PET-CT. (Image  52/222). These findings are grossly stable to recent CT. Fiducial markers identified at the region of right lower lobe malignancy. Right-sided moderate pleural effusion stable to prior CT and new to prior PET-CT. 'Severe paraseptal emphysematous changes. The heart size is borderline normal. Atherosclerotic calcifications of coronary arteries and aorta. Trace pericardial fluid. ABDOMEN/PELVIS: No suspicious findings to suggest viable metastatic disease. Stable non FDG avid right adrenal nodule, benign versus treated disease. Incidental CT findings: Cholelithiasis sigmoid diverticulosis without diverticulitis. Calcified uterine fibroid. Left kidney hypodense lesion without FDG uptake likely is small simple cyst incompletely assessed on current noncontrast CT. SKELETON: No suspicious findings to suggest osseous metastasis. Degenerative changes of the spine. Incidental CT findings: None. IMPRESSION: Obstructive right lower lobe hypermetabolic malignancy with contralateral left lower lobe metastasis (versus second primary), progressive ipsilateral hilar/mediastinal lymphadenopathy (N2) and complete atelectasis of right lower lobe. Overall findings are progressive to prior PET-CT and grossly similar to recent chest CT. Electronically Signed   By: Megan  Zare M.D.   On: 07/17/2024 13:33     ASSESSMENT/PLAN:  This is a very pleasant 73 year old African-American female with:  1) history of stage Ia non-small cell lung cancer, adenocarcinoma the right upper lobe who under went radiation under the care of Dr. Patrcia from 04/23/2022-04/29/22 2 Recurrent lung cancer, initially diagnosed as Stage IIIa  confirm (T3, N2, M0) non-small cell lung cancer, squamous cell carcinoma. She presented with recurrent tumor at the right lung base, associated right lower lobe metastasis, and right hilar and subcarinal nodal metastases. This was diagnosed in October 2024.    PDL1 expression 85%.    The patient is scheduled to undergo consolidation immunotherapy with Imfinzi  1500 mg IV every 4 weeks . First dose on 02/07/24.  She is status post 1 cycle.  Her treatment was discontinued due to concerns with pneumonitis.   She was found to have disease progresion in July 2025.    Therefore, Dr. Sherrod recommended palliative systemic chemotherapy with carboplatin  for an AUC of 5 and taxol  175 mg/m2 and Keytruda  200 mg IV every 3 weeks.  She is status post 1 cycle. She underwent cycle #1 on 07/23/24  The patient was seen by Dr. Sherrod today.  Due to her symptoms, we will delay her treatment by 1 week and prescribed another course of antibiotics with Levaquin .  I also have discontinued her Hydromet and have sent in the prescription that she states is effective for her which was prescribed during her May 2025 hospitalization.  She knows not to take Hydromet and this cough medication together and to only take 1.  She will use heating pads and Tylenol  for her back discomfort.  I also gave her a few samples of Salonpas patches.  He was advised to avoid gas producing foods and continue Gas-X.  Her pain may also be secondary to coughing.  I did caution the patient should she develop any worsening respiratory symptoms such as bright red blood in the sputum, worsening shortness of breath, cough, fevers, etc. to seek emergency room evaluation.  She is also scheduled to see her pulmonologist next week on 08/22/2024.  She will see Dr. Sherrod or myself next week prior to undergoing cycle #2.  Have canceled her Neulasta  injection appointment scheduled for later this week. We will reschedule for next week.   The patient was advised  to call immediately if she has any concerning symptoms in the interval. The patient voices understanding of current disease status and treatment options and is in agreement with  the current care plan. All questions were answered. The patient knows to call the clinic with any problems, questions or concerns. We can certainly see the patient much sooner if necessary     Orders Placed This Encounter  Procedures   CBC with Differential (Cancer Center Only)    Standing Status:   Future    Expected Date:   08/20/2024    Expiration Date:   08/20/2025   CMP (Cancer Center only)    Standing Status:   Future    Expected Date:   08/20/2024    Expiration Date:   08/20/2025      Calton CROME Nitisha Civello, PA-C 08/13/24  ADDENDUM: Hematology/Oncology Attending: I had a face-to-face encounter with the patient today.  I reviewed her records, lab, and recommended her care plan.  This is a very pleasant 73 years old African-American female with recurrent non-small cell lung cancer presented as a stage IIIa status post a course of concurrent chemoradiation followed by 1 cycle of consolidation immunotherapy with Imfinzi  discontinued secondary to pneumonitis.  The patient had evidence for disease progression and she started treatment again with palliative systemic chemotherapy with carboplatin , paclitaxel  and Keytruda  status post 1 cycle.  She was supposed to start cycle #2 today but she has been complaining of increasing fatigue and weakness as well as worsening shortness of breath.  She was treated recently for suspicious pneumonia with blood-tinged sputum on and off.  She completed a course of antibiotics with mild improvement of her symptoms.  She still have persistent shortness of breath.  She has an appointment with her pulmonologist Dr. Milagros on 08/22/2024. I recommended for the patient to delay the start of cycle number 2 by 1 week. During this time we will give her a course of antibiotic with Levaquin   for 7 days. If she has improvement of her symptoms we will resume her treatment with cycle #2. The patient was advised to call immediately if she has any other concerning symptoms in the interval. The total time spent in the appointment was 30 minutes including review of chart and various tests results, discussions about plan of care and coordination of care plan . Disclaimer: This note was dictated with voice recognition software. Similar sounding words can inadvertently be transcribed and may be missed upon review. Sherrod MARLA Sherrod, MD

## 2024-08-01 ENCOUNTER — Other Ambulatory Visit: Payer: Self-pay | Admitting: Physician Assistant

## 2024-08-01 ENCOUNTER — Inpatient Hospital Stay

## 2024-08-01 ENCOUNTER — Other Ambulatory Visit: Payer: Self-pay

## 2024-08-01 DIAGNOSIS — D649 Anemia, unspecified: Secondary | ICD-10-CM

## 2024-08-01 DIAGNOSIS — C3411 Malignant neoplasm of upper lobe, right bronchus or lung: Secondary | ICD-10-CM | POA: Diagnosis not present

## 2024-08-01 DIAGNOSIS — E876 Hypokalemia: Secondary | ICD-10-CM

## 2024-08-01 DIAGNOSIS — C3431 Malignant neoplasm of lower lobe, right bronchus or lung: Secondary | ICD-10-CM

## 2024-08-01 LAB — CMP (CANCER CENTER ONLY)
ALT: <5 U/L (ref 0–44)
AST: 11 U/L — ABNORMAL LOW (ref 15–41)
Albumin: 3.4 g/dL — ABNORMAL LOW (ref 3.5–5.0)
Alkaline Phosphatase: 103 U/L (ref 38–126)
Anion gap: 10 (ref 5–15)
BUN: 23 mg/dL (ref 8–23)
CO2: 26 mmol/L (ref 22–32)
Calcium: 9.6 mg/dL (ref 8.9–10.3)
Chloride: 102 mmol/L (ref 98–111)
Creatinine: 0.96 mg/dL (ref 0.44–1.00)
GFR, Estimated: >60 mL/min (ref >60)
Glucose, Bld: 283 mg/dL — ABNORMAL HIGH (ref 70–99)
Potassium: 3.2 mmol/L — ABNORMAL LOW (ref 3.5–5.1)
Sodium: 138 mmol/L (ref 135–145)
Total Bilirubin: 0.2 mg/dL (ref 0.0–1.2)
Total Protein: 6.9 g/dL (ref 6.5–8.1)

## 2024-08-01 LAB — CBC WITH DIFFERENTIAL (CANCER CENTER ONLY)
Abs Immature Granulocytes: 0.64 K/uL — ABNORMAL HIGH (ref 0.00–0.07)
Basophils Absolute: 0 K/uL (ref 0.0–0.1)
Basophils Relative: 0 %
Eosinophils Absolute: 0 K/uL (ref 0.0–0.5)
Eosinophils Relative: 0 %
HCT: 32.4 % — ABNORMAL LOW (ref 36.0–46.0)
Hemoglobin: 10.2 g/dL — ABNORMAL LOW (ref 12.0–15.0)
Immature Granulocytes: 3 %
Lymphocytes Relative: 5 %
Lymphs Abs: 1.1 K/uL (ref 0.7–4.0)
MCH: 25.9 pg — ABNORMAL LOW (ref 26.0–34.0)
MCHC: 31.5 g/dL (ref 30.0–36.0)
MCV: 82.2 fL (ref 80.0–100.0)
Monocytes Absolute: 0.7 K/uL (ref 0.1–1.0)
Monocytes Relative: 4 %
Neutro Abs: 18.4 K/uL — ABNORMAL HIGH (ref 1.7–7.7)
Neutrophils Relative %: 88 %
Platelet Count: 271 K/uL (ref 150–400)
RBC: 3.94 MIL/uL (ref 3.87–5.11)
RDW: 15.9 % — ABNORMAL HIGH (ref 11.5–15.5)
WBC Count: 20.9 K/uL — ABNORMAL HIGH (ref 4.0–10.5)
nRBC: 0.5 % — ABNORMAL HIGH (ref 0.0–0.2)

## 2024-08-01 MED ORDER — POTASSIUM CHLORIDE CRYS ER 20 MEQ PO TBCR: 20.0000 meq | EXTENDED_RELEASE_TABLET | Freq: Every day | ORAL | 0 refills | Status: DC

## 2024-08-03 ENCOUNTER — Ambulatory Visit: Payer: Self-pay

## 2024-08-03 NOTE — Telephone Encounter (Signed)
 Spoke with the patient regarding recent lab results. Per Cassie, PA, the patient's potassium level is slightly low. A prescription for potassium has been sent to the pharmacy to be taken once daily for 5 days. Patient voiced understanding.

## 2024-08-06 ENCOUNTER — Telehealth: Payer: Self-pay | Admitting: Medical Oncology

## 2024-08-06 ENCOUNTER — Encounter: Payer: Self-pay | Admitting: Pulmonary Disease

## 2024-08-06 DIAGNOSIS — E876 Hypokalemia: Secondary | ICD-10-CM

## 2024-08-06 MED ORDER — POTASSIUM CHLORIDE CRYS ER 20 MEQ PO TBCR: 20.0000 meq | EXTENDED_RELEASE_TABLET | Freq: Every day | ORAL | 0 refills | Status: DC

## 2024-08-06 NOTE — Telephone Encounter (Signed)
 Requested to reroute her Potassium rx to her local [facility] on [address]. Done.

## 2024-08-06 NOTE — Telephone Encounter (Signed)
 Pt notified that [facility] rx was rerouted to preferred pharmacy .

## 2024-08-07 ENCOUNTER — Inpatient Hospital Stay: Admitting: Licensed Clinical Social Worker

## 2024-08-07 ENCOUNTER — Other Ambulatory Visit: Payer: Self-pay

## 2024-08-07 ENCOUNTER — Other Ambulatory Visit: Payer: Self-pay | Admitting: Pulmonary Disease

## 2024-08-07 DIAGNOSIS — R0609 Other forms of dyspnea: Secondary | ICD-10-CM

## 2024-08-07 DIAGNOSIS — J4489 Other specified chronic obstructive pulmonary disease: Secondary | ICD-10-CM

## 2024-08-07 DIAGNOSIS — C3431 Malignant neoplasm of lower lobe, right bronchus or lung: Secondary | ICD-10-CM

## 2024-08-07 MED ORDER — UMECLIDINIUM-VILANTEROL 62.5-25 MCG/ACT IN AEPB: 1.0000 | INHALATION_SPRAY | Freq: Every day | RESPIRATORY_TRACT | 3 refills | Status: DC

## 2024-08-07 MED ORDER — HYDROCODONE BIT-HOMATROP MBR 5-1.5 MG/5ML PO SOLN: 5.0000 mL | Freq: Four times a day (QID) | ORAL | 0 refills | Status: DC | PRN

## 2024-08-07 MED ORDER — ALBUTEROL SULFATE HFA 108 (90 BASE) MCG/ACT IN AERS: 2.0000 | INHALATION_SPRAY | Freq: Four times a day (QID) | RESPIRATORY_TRACT | 2 refills | Status: DC | PRN

## 2024-08-07 NOTE — Telephone Encounter (Signed)
 Spoke with patient regarding prior message. Patient stated she has been having a cough and sob  The coughing is keeping patient up at night .  Dr.Olalere can you please advise .

## 2024-08-07 NOTE — Progress Notes (Signed)
 Prescription for Hydromet

## 2024-08-08 ENCOUNTER — Inpatient Hospital Stay

## 2024-08-09 ENCOUNTER — Encounter: Payer: Self-pay | Admitting: Internal Medicine

## 2024-08-09 ENCOUNTER — Telehealth: Payer: Self-pay | Admitting: Medical Oncology

## 2024-08-09 NOTE — Progress Notes (Signed)
 [facility] CSW Counseling Note  Patient was referred by self. Treatment type: Individual  Presenting Concerns: Patient and/or family reports the following symptoms/concerns: anxiety and depression Duration of problem: 3 months; Severity of problem: moderate   Orientation:oriented to person, place, time/date, situation, day of week, month of year, and year.   Affect: Appropriate and Congruent Risk of harm to self or others: No plan to harm self or others  Patient and/or Family's Strengths/Protective Factors: Social connections, Social and Emotional competence, and Concrete supports in place (healthy food, safe environments, etc.)Capable of independent living  Motivation for treatment/growth  Religious Affiliation  Supportive family/friends      Goals Addressed: Patient will:  Reduce symptoms of: anxiety and depression Increase knowledge and/or ability of: coping skills  Increase healthy adjustment to current life circumstances   Progress towards Goals: Initial   Interventions: Interventions utilized:  Motivational Interviewing and Solution-Focused Strategies       Assessment: Patient currently experiencing depression and anxiety related to recent progression of cancer and change in treatment.  Pt has undergone multiple treatments over the past two years for lung cancer with the most recent treatment being chemotherapy due to progression.  Pt has experienced significant physical decline and is now dependent on oxygen.  This has impacted pt's ability to engage in activities she enjoys and has led to some social isolation.  CSW provided space for pt to express her feelings regarding her current circumstances.  Pt has a strong spiritual connection and has not been going to church due to her fatigue and oxygen dependence.  CSW spoke at length about the importance of engaging in activities that bring joy and the benefits of setting goals and communicating her needs.  The benefits of establishing  a daily routine also discussed.  Pt provided with a food bag.      Plan: Follow up with CSW: pt will contact CSW to schedule additional counseling as needed        Devere JONELLE Manna, LCSW

## 2024-08-09 NOTE — Telephone Encounter (Signed)
 Since Monday she has experienced R knee pain intermittent . Described it as a hurt.  Makes it difficult to go up stairs more so than going down stairs. Hx of srthritis.  Tylenol  arthritis and ice to knees 2-3 times a day has been helpful. I told pt to continue her intervention and keep appt next week.

## 2024-08-13 ENCOUNTER — Other Ambulatory Visit: Payer: Self-pay | Admitting: Physician Assistant

## 2024-08-13 ENCOUNTER — Inpatient Hospital Stay

## 2024-08-13 ENCOUNTER — Inpatient Hospital Stay (HOSPITAL_BASED_OUTPATIENT_CLINIC_OR_DEPARTMENT_OTHER): Admitting: Physician Assistant

## 2024-08-13 VITALS — BP 146/84 | HR 107 | Temp 97.0°F | Resp 18

## 2024-08-13 DIAGNOSIS — C3411 Malignant neoplasm of upper lobe, right bronchus or lung: Secondary | ICD-10-CM | POA: Diagnosis not present

## 2024-08-13 DIAGNOSIS — D649 Anemia, unspecified: Secondary | ICD-10-CM

## 2024-08-13 DIAGNOSIS — C3431 Malignant neoplasm of lower lobe, right bronchus or lung: Secondary | ICD-10-CM

## 2024-08-13 DIAGNOSIS — J441 Chronic obstructive pulmonary disease with (acute) exacerbation: Secondary | ICD-10-CM | POA: Diagnosis not present

## 2024-08-13 DIAGNOSIS — R059 Cough, unspecified: Secondary | ICD-10-CM | POA: Diagnosis not present

## 2024-08-13 LAB — CBC WITH DIFFERENTIAL (CANCER CENTER ONLY)
Abs Immature Granulocytes: 0.04 K/uL (ref 0.00–0.07)
Basophils Absolute: 0.1 K/uL (ref 0.0–0.1)
Basophils Relative: 1 %
Eosinophils Absolute: 0.1 K/uL (ref 0.0–0.5)
Eosinophils Relative: 1 %
HCT: 32.1 % — ABNORMAL LOW (ref 36.0–46.0)
Hemoglobin: 10 g/dL — ABNORMAL LOW (ref 12.0–15.0)
Immature Granulocytes: 0 %
Lymphocytes Relative: 11 %
Lymphs Abs: 1.2 K/uL (ref 0.7–4.0)
MCH: 26 pg (ref 26.0–34.0)
MCHC: 31.2 g/dL (ref 30.0–36.0)
MCV: 83.4 fL (ref 80.0–100.0)
Monocytes Absolute: 0.9 K/uL (ref 0.1–1.0)
Monocytes Relative: 8 %
Neutro Abs: 8.6 K/uL — ABNORMAL HIGH (ref 1.7–7.7)
Neutrophils Relative %: 79 %
Platelet Count: 440 K/uL — ABNORMAL HIGH (ref 150–400)
RBC: 3.85 MIL/uL — ABNORMAL LOW (ref 3.87–5.11)
RDW: 17.4 % — ABNORMAL HIGH (ref 11.5–15.5)
WBC Count: 10.9 K/uL — ABNORMAL HIGH (ref 4.0–10.5)
nRBC: 0 % (ref 0.0–0.2)

## 2024-08-13 LAB — CMP (CANCER CENTER ONLY)
ALT: <5 U/L (ref 0–44)
AST: 11 U/L — ABNORMAL LOW (ref 15–41)
Albumin: 3.5 g/dL (ref 3.5–5.0)
Alkaline Phosphatase: 87 U/L (ref 38–126)
Anion gap: 14 (ref 5–15)
BUN: 10 mg/dL (ref 8–23)
CO2: 20 mmol/L — ABNORMAL LOW (ref 22–32)
Calcium: 9.1 mg/dL (ref 8.9–10.3)
Chloride: 102 mmol/L (ref 98–111)
Creatinine: 0.59 mg/dL (ref 0.44–1.00)
GFR, Estimated: >60 mL/min (ref >60)
Glucose, Bld: 224 mg/dL — ABNORMAL HIGH (ref 70–99)
Potassium: 3.7 mmol/L (ref 3.5–5.1)
Sodium: 136 mmol/L (ref 135–145)
Total Bilirubin: 0.3 mg/dL (ref 0.0–1.2)
Total Protein: 7.4 g/dL (ref 6.5–8.1)

## 2024-08-13 LAB — SAMPLE TO BLOOD BANK

## 2024-08-13 MED ORDER — GUAIFENESIN-CODEINE 100-10 MG/5ML PO SOLN: 5.0000 mL | Freq: Three times a day (TID) | ORAL | 0 refills | Status: DC | PRN

## 2024-08-13 MED ORDER — LEVOFLOXACIN 500 MG PO TABS: 500.0000 mg | ORAL_TABLET | Freq: Every day | ORAL | 0 refills | Status: DC

## 2024-08-15 ENCOUNTER — Other Ambulatory Visit: Payer: Self-pay

## 2024-08-15 ENCOUNTER — Inpatient Hospital Stay

## 2024-08-16 ENCOUNTER — Inpatient Hospital Stay (HOSPITAL_COMMUNITY)
Admission: EM | Admit: 2024-08-16 | Discharge: 2024-08-25 | DRG: 180 | Disposition: A | Discharge status: Home Health | Attending: Internal Medicine | Admitting: Internal Medicine

## 2024-08-16 ENCOUNTER — Emergency Department (HOSPITAL_COMMUNITY)

## 2024-08-16 ENCOUNTER — Ambulatory Visit

## 2024-08-16 ENCOUNTER — Encounter (HOSPITAL_COMMUNITY): Payer: Self-pay

## 2024-08-16 DIAGNOSIS — J441 Chronic obstructive pulmonary disease with (acute) exacerbation: Principal | ICD-10-CM | POA: Diagnosis present

## 2024-08-16 DIAGNOSIS — J9811 Atelectasis: Secondary | ICD-10-CM | POA: Diagnosis present

## 2024-08-16 DIAGNOSIS — E782 Mixed hyperlipidemia: Secondary | ICD-10-CM

## 2024-08-16 DIAGNOSIS — E1165 Type 2 diabetes mellitus with hyperglycemia: Secondary | ICD-10-CM | POA: Diagnosis present

## 2024-08-16 DIAGNOSIS — Z951 Presence of aortocoronary bypass graft: Secondary | ICD-10-CM

## 2024-08-16 DIAGNOSIS — Z87891 Personal history of nicotine dependence: Secondary | ICD-10-CM

## 2024-08-16 DIAGNOSIS — R059 Cough, unspecified: Secondary | ICD-10-CM

## 2024-08-16 DIAGNOSIS — R0600 Dyspnea, unspecified: Principal | ICD-10-CM | POA: Diagnosis present

## 2024-08-16 DIAGNOSIS — I4719 Other supraventricular tachycardia: Secondary | ICD-10-CM | POA: Diagnosis present

## 2024-08-16 DIAGNOSIS — R9431 Abnormal electrocardiogram [ECG] [EKG]: Secondary | ICD-10-CM | POA: Diagnosis not present

## 2024-08-16 DIAGNOSIS — Z825 Family history of asthma and other chronic lower respiratory diseases: Secondary | ICD-10-CM

## 2024-08-16 DIAGNOSIS — R809 Proteinuria, unspecified: Secondary | ICD-10-CM | POA: Diagnosis present

## 2024-08-16 DIAGNOSIS — J849 Interstitial pulmonary disease, unspecified: Secondary | ICD-10-CM | POA: Diagnosis present

## 2024-08-16 DIAGNOSIS — Z7982 Long term (current) use of aspirin: Secondary | ICD-10-CM | POA: Diagnosis not present

## 2024-08-16 DIAGNOSIS — E785 Hyperlipidemia, unspecified: Secondary | ICD-10-CM | POA: Diagnosis present

## 2024-08-16 DIAGNOSIS — J9601 Acute respiratory failure with hypoxia: Secondary | ICD-10-CM | POA: Diagnosis not present

## 2024-08-16 DIAGNOSIS — D75839 Thrombocytosis, unspecified: Secondary | ICD-10-CM | POA: Diagnosis not present

## 2024-08-16 DIAGNOSIS — C3411 Malignant neoplasm of upper lobe, right bronchus or lung: Secondary | ICD-10-CM | POA: Diagnosis present

## 2024-08-16 DIAGNOSIS — T380X5A Adverse effect of glucocorticoids and synthetic analogues, initial encounter: Secondary | ICD-10-CM | POA: Diagnosis present

## 2024-08-16 DIAGNOSIS — Z7189 Other specified counseling: Secondary | ICD-10-CM | POA: Diagnosis not present

## 2024-08-16 DIAGNOSIS — E876 Hypokalemia: Secondary | ICD-10-CM | POA: Diagnosis present

## 2024-08-16 DIAGNOSIS — F419 Anxiety disorder, unspecified: Secondary | ICD-10-CM | POA: Diagnosis present

## 2024-08-16 DIAGNOSIS — J9610 Chronic respiratory failure, unspecified whether with hypoxia or hypercapnia: Secondary | ICD-10-CM | POA: Diagnosis not present

## 2024-08-16 DIAGNOSIS — C779 Secondary and unspecified malignant neoplasm of lymph node, unspecified: Secondary | ICD-10-CM | POA: Diagnosis present

## 2024-08-16 DIAGNOSIS — I10 Essential (primary) hypertension: Secondary | ICD-10-CM | POA: Diagnosis present

## 2024-08-16 DIAGNOSIS — Z7984 Long term (current) use of oral hypoglycemic drugs: Secondary | ICD-10-CM

## 2024-08-16 DIAGNOSIS — I2510 Atherosclerotic heart disease of native coronary artery without angina pectoris: Secondary | ICD-10-CM | POA: Diagnosis present

## 2024-08-16 DIAGNOSIS — Z885 Allergy status to narcotic agent status: Secondary | ICD-10-CM

## 2024-08-16 DIAGNOSIS — Z515 Encounter for palliative care: Secondary | ICD-10-CM

## 2024-08-16 DIAGNOSIS — E1129 Type 2 diabetes mellitus with other diabetic kidney complication: Secondary | ICD-10-CM | POA: Diagnosis not present

## 2024-08-16 DIAGNOSIS — Z66 Do not resuscitate: Secondary | ICD-10-CM | POA: Diagnosis present

## 2024-08-16 DIAGNOSIS — J90 Pleural effusion, not elsewhere classified: Secondary | ICD-10-CM | POA: Diagnosis not present

## 2024-08-16 DIAGNOSIS — Z85118 Personal history of other malignant neoplasm of bronchus and lung: Secondary | ICD-10-CM

## 2024-08-16 DIAGNOSIS — Z9981 Dependence on supplemental oxygen: Secondary | ICD-10-CM

## 2024-08-16 DIAGNOSIS — J439 Emphysema, unspecified: Secondary | ICD-10-CM | POA: Diagnosis present

## 2024-08-16 DIAGNOSIS — Z6827 Body mass index (BMI) 27.0-27.9, adult: Secondary | ICD-10-CM

## 2024-08-16 DIAGNOSIS — E872 Acidosis, unspecified: Secondary | ICD-10-CM | POA: Diagnosis present

## 2024-08-16 DIAGNOSIS — E663 Overweight: Secondary | ICD-10-CM | POA: Diagnosis present

## 2024-08-16 DIAGNOSIS — D630 Anemia in neoplastic disease: Secondary | ICD-10-CM | POA: Diagnosis present

## 2024-08-16 DIAGNOSIS — J910 Malignant pleural effusion: Secondary | ICD-10-CM | POA: Diagnosis present

## 2024-08-16 DIAGNOSIS — J449 Chronic obstructive pulmonary disease, unspecified: Secondary | ICD-10-CM | POA: Diagnosis not present

## 2024-08-16 DIAGNOSIS — C3431 Malignant neoplasm of lower lobe, right bronchus or lung: Secondary | ICD-10-CM | POA: Diagnosis present

## 2024-08-16 DIAGNOSIS — Z794 Long term (current) use of insulin: Secondary | ICD-10-CM | POA: Diagnosis not present

## 2024-08-16 DIAGNOSIS — Z888 Allergy status to other drugs, medicaments and biological substances status: Secondary | ICD-10-CM

## 2024-08-16 DIAGNOSIS — Z79899 Other long term (current) drug therapy: Secondary | ICD-10-CM

## 2024-08-16 DIAGNOSIS — R000 Tachycardia, unspecified: Secondary | ICD-10-CM | POA: Diagnosis not present

## 2024-08-16 DIAGNOSIS — R042 Hemoptysis: Secondary | ICD-10-CM | POA: Diagnosis present

## 2024-08-16 DIAGNOSIS — R54 Age-related physical debility: Secondary | ICD-10-CM | POA: Diagnosis present

## 2024-08-16 DIAGNOSIS — Z886 Allergy status to analgesic agent status: Secondary | ICD-10-CM

## 2024-08-16 DIAGNOSIS — J9621 Acute and chronic respiratory failure with hypoxia: Secondary | ICD-10-CM | POA: Diagnosis present

## 2024-08-16 DIAGNOSIS — Z789 Other specified health status: Secondary | ICD-10-CM | POA: Diagnosis not present

## 2024-08-16 LAB — COMPREHENSIVE METABOLIC PANEL WITH GFR
ALT: <5 U/L (ref 0–44)
AST: 15 U/L (ref 15–41)
Albumin: 2.8 g/dL — ABNORMAL LOW (ref 3.5–5.0)
Alkaline Phosphatase: 82 U/L (ref 38–126)
Anion gap: 15 (ref 5–15)
BUN: 11 mg/dL (ref 8–23)
CO2: 17 mmol/L — ABNORMAL LOW (ref 22–32)
Calcium: 9.6 mg/dL (ref 8.9–10.3)
Chloride: 107 mmol/L (ref 98–111)
Creatinine, Ser: 0.61 mg/dL (ref 0.44–1.00)
GFR, Estimated: >60 mL/min (ref >60)
Glucose, Bld: 154 mg/dL — ABNORMAL HIGH (ref 70–99)
Potassium: 3.9 mmol/L (ref 3.5–5.1)
Sodium: 139 mmol/L (ref 135–145)
Total Bilirubin: 0.9 mg/dL (ref 0.0–1.2)
Total Protein: 8.2 g/dL — ABNORMAL HIGH (ref 6.5–8.1)

## 2024-08-16 LAB — BLOOD GAS, VENOUS
Acid-base deficit: 4 mmol/L — ABNORMAL HIGH (ref 0.0–2.0)
Bicarbonate: 19.8 mmol/L — ABNORMAL LOW (ref 20.0–28.0)
O2 Saturation: 36.5 %
Patient temperature: 37
pCO2, Ven: 32 mmHg — ABNORMAL LOW (ref 44–60)
pH, Ven: 7.4 (ref 7.25–7.43)
pO2, Ven: <31 mmHg — CL (ref 32–45)

## 2024-08-16 LAB — LACTIC ACID, PLASMA: Lactic Acid, Venous: 1.1 mmol/L (ref 0.5–1.9)

## 2024-08-16 LAB — PHOSPHORUS: Phosphorus: 3 mg/dL (ref 2.5–4.6)

## 2024-08-16 LAB — CBC WITH DIFFERENTIAL/PLATELET
Abs Immature Granulocytes: 0.06 K/uL (ref 0.00–0.07)
Basophils Absolute: 0.1 K/uL (ref 0.0–0.1)
Basophils Relative: 0 %
Eosinophils Absolute: 0.1 K/uL (ref 0.0–0.5)
Eosinophils Relative: 1 %
HCT: 33.6 % — ABNORMAL LOW (ref 36.0–46.0)
Hemoglobin: 9.7 g/dL — ABNORMAL LOW (ref 12.0–15.0)
Immature Granulocytes: 1 %
Lymphocytes Relative: 10 %
Lymphs Abs: 1.2 K/uL (ref 0.7–4.0)
MCH: 25.1 pg — ABNORMAL LOW (ref 26.0–34.0)
MCHC: 28.9 g/dL — ABNORMAL LOW (ref 30.0–36.0)
MCV: 87 fL (ref 80.0–100.0)
Monocytes Absolute: 1 K/uL (ref 0.1–1.0)
Monocytes Relative: 8 %
Neutro Abs: 10 K/uL — ABNORMAL HIGH (ref 1.7–7.7)
Neutrophils Relative %: 80 %
Platelets: 500 K/uL — ABNORMAL HIGH (ref 150–400)
RBC: 3.86 MIL/uL — ABNORMAL LOW (ref 3.87–5.11)
RDW: 17.8 % — ABNORMAL HIGH (ref 11.5–15.5)
WBC: 12.4 K/uL — ABNORMAL HIGH (ref 4.0–10.5)
nRBC: 0.2 % (ref 0.0–0.2)

## 2024-08-16 LAB — CK: Total CK: 27 U/L — ABNORMAL LOW (ref 38–234)

## 2024-08-16 LAB — RESP PANEL BY RT-PCR (RSV, FLU A&B, COVID)  RVPGX2
Influenza A by PCR: NEGATIVE
Influenza B by PCR: NEGATIVE
Resp Syncytial Virus by PCR: NEGATIVE
SARS Coronavirus 2 by RT PCR: NEGATIVE

## 2024-08-16 LAB — PROTIME-INR
INR: 1 (ref 0.8–1.2)
Prothrombin Time: 14.1 s (ref 11.4–15.2)

## 2024-08-16 LAB — MAGNESIUM: Magnesium: 1.5 mg/dL — ABNORMAL LOW (ref 1.7–2.4)

## 2024-08-16 LAB — CBG MONITORING, ED
Glucose-Capillary: 236 mg/dL — ABNORMAL HIGH (ref 70–99)
Glucose-Capillary: 185 mg/dL — ABNORMAL HIGH (ref 70–99)

## 2024-08-16 MED ORDER — BISOPROLOL FUMARATE 5 MG PO TABS: 2.5000 mg | ORAL_TABLET | Freq: Every day | ORAL | Status: DC

## 2024-08-16 MED ORDER — TRANEXAMIC ACID FOR INHALATION
500.0000 mg | Freq: Once | RESPIRATORY_TRACT | Status: AC
  Administered 2024-08-16: 500 mg via RESPIRATORY_TRACT
  Filled 2024-08-16: qty 10

## 2024-08-16 MED ORDER — ALBUTEROL SULFATE (2.5 MG/3ML) 0.083% IN NEBU
2.5000 mg | INHALATION_SOLUTION | RESPIRATORY_TRACT | Status: DC | PRN
  Administered 2024-08-21 (×3): 2.5 mg via RESPIRATORY_TRACT
  Filled 2024-08-16 (×3): qty 3

## 2024-08-16 MED ORDER — SODIUM CHLORIDE 0.9 % IV SOLN
100.0000 mg | Freq: Once | INTRAVENOUS | Status: AC
  Administered 2024-08-16: 100 mg via INTRAVENOUS
  Filled 2024-08-16: qty 100

## 2024-08-16 MED ORDER — PRAVASTATIN SODIUM 40 MG PO TABS
80.0000 mg | ORAL_TABLET | Freq: Every day | ORAL | Status: DC
  Administered 2024-08-17 – 2024-08-24 (×9): 80 mg via ORAL
  Filled 2024-08-16: qty 4
  Filled 2024-08-16: qty 2
  Filled 2024-08-16 (×6): qty 4
  Filled 2024-08-16: qty 2

## 2024-08-16 MED ORDER — SODIUM CHLORIDE 0.9 % IV SOLN
1.0000 g | INTRAVENOUS | Status: AC
  Administered 2024-08-17 – 2024-08-21 (×5): 1 g via INTRAVENOUS
  Filled 2024-08-16 (×6): qty 10

## 2024-08-16 MED ORDER — MAGNESIUM SULFATE 2 GM/50ML IV SOLN
2.0000 g | Freq: Once | INTRAVENOUS | Status: AC
  Administered 2024-08-16: 2 g via INTRAVENOUS
  Filled 2024-08-16: qty 50

## 2024-08-16 MED ORDER — HYDROCODONE-ACETAMINOPHEN 5-325 MG PO TABS: 1.0000 | ORAL_TABLET | ORAL | Status: DC | PRN

## 2024-08-16 MED ORDER — IOHEXOL 350 MG/ML SOLN
75.0000 mL | Freq: Once | INTRAVENOUS | Status: AC | PRN
  Administered 2024-08-16: 75 mL via INTRAVENOUS

## 2024-08-16 MED ORDER — IPRATROPIUM-ALBUTEROL 0.5-2.5 (3) MG/3ML IN SOLN
3.0000 mL | Freq: Once | RESPIRATORY_TRACT | Status: AC
  Administered 2024-08-16: 3 mL via RESPIRATORY_TRACT
  Filled 2024-08-16: qty 3

## 2024-08-16 MED ORDER — ACETAMINOPHEN 650 MG RE SUPP: 650.0000 mg | Freq: Four times a day (QID) | RECTAL | Status: DC | PRN

## 2024-08-16 MED ORDER — LACTATED RINGERS IV BOLUS
1000.0000 mL | Freq: Once | INTRAVENOUS | Status: AC
  Administered 2024-08-16: 1000 mL via INTRAVENOUS

## 2024-08-16 MED ORDER — INSULIN ASPART 100 UNIT/ML IJ SOLN
0.0000 [IU] | INTRAMUSCULAR | Status: DC
  Administered 2024-08-16: 2 [IU] via SUBCUTANEOUS
  Administered 2024-08-16 – 2024-08-17 (×5): 3 [IU] via SUBCUTANEOUS
  Administered 2024-08-17: 7 [IU] via SUBCUTANEOUS
  Administered 2024-08-18: 2 [IU] via SUBCUTANEOUS
  Administered 2024-08-18: 5 [IU] via SUBCUTANEOUS
  Administered 2024-08-18: 7 [IU] via SUBCUTANEOUS
  Administered 2024-08-18: 3 [IU] via SUBCUTANEOUS
  Administered 2024-08-18: 7 [IU] via SUBCUTANEOUS
  Administered 2024-08-18: 5 [IU] via SUBCUTANEOUS
  Administered 2024-08-19: 3 [IU] via SUBCUTANEOUS
  Administered 2024-08-19: 2 [IU] via SUBCUTANEOUS
  Administered 2024-08-19: 7 [IU] via SUBCUTANEOUS
  Administered 2024-08-19: 2 [IU] via SUBCUTANEOUS
  Administered 2024-08-19: 9 [IU] via SUBCUTANEOUS
  Administered 2024-08-19: 7 [IU] via SUBCUTANEOUS
  Administered 2024-08-20: 9 [IU] via SUBCUTANEOUS
  Administered 2024-08-20: 3 [IU] via SUBCUTANEOUS
  Administered 2024-08-20 (×2): 1 [IU] via SUBCUTANEOUS
  Administered 2024-08-20 (×2): 5 [IU] via SUBCUTANEOUS
  Administered 2024-08-21 (×2): 9 [IU] via SUBCUTANEOUS
  Administered 2024-08-21: 5 [IU] via SUBCUTANEOUS
  Administered 2024-08-21: 1 [IU] via SUBCUTANEOUS
  Administered 2024-08-21 (×2): 7 [IU] via SUBCUTANEOUS
  Administered 2024-08-21: 1 [IU] via SUBCUTANEOUS
  Administered 2024-08-22: 2 [IU] via SUBCUTANEOUS
  Administered 2024-08-22: 9 [IU] via SUBCUTANEOUS
  Administered 2024-08-22: 5 [IU] via SUBCUTANEOUS
  Administered 2024-08-22: 1 [IU] via SUBCUTANEOUS
  Administered 2024-08-22: 9 [IU] via SUBCUTANEOUS
  Administered 2024-08-22: 2 [IU] via SUBCUTANEOUS
  Administered 2024-08-23: 3 [IU] via SUBCUTANEOUS
  Filled 2024-08-16: qty 0.09

## 2024-08-16 MED ORDER — ACETAMINOPHEN 325 MG PO TABS
650.0000 mg | ORAL_TABLET | Freq: Four times a day (QID) | ORAL | Status: DC | PRN
  Administered 2024-08-17 – 2024-08-25 (×5): 650 mg via ORAL
  Filled 2024-08-16 (×5): qty 2

## 2024-08-16 MED ORDER — SODIUM CHLORIDE 0.9 % IV SOLN: INTRAVENOUS | Status: DC

## 2024-08-16 MED ORDER — FENTANYL CITRATE PF 50 MCG/ML IJ SOSY: 12.5000 ug | PREFILLED_SYRINGE | INTRAMUSCULAR | Status: DC | PRN

## 2024-08-16 MED ORDER — SODIUM CHLORIDE 0.9 % IV SOLN: INTRAVENOUS | Status: AC

## 2024-08-16 MED ORDER — PREDNISONE 20 MG PO TABS
40.0000 mg | ORAL_TABLET | Freq: Every day | ORAL | Status: AC
  Administered 2024-08-18 – 2024-08-21 (×4): 40 mg via ORAL
  Filled 2024-08-16 (×4): qty 2

## 2024-08-16 MED ORDER — SODIUM CHLORIDE 0.9 % IV SOLN
2.0000 g | Freq: Once | INTRAVENOUS | Status: AC
  Administered 2024-08-16: 2 g via INTRAVENOUS
  Filled 2024-08-16: qty 20

## 2024-08-16 MED ORDER — DILTIAZEM HCL 25 MG/5ML IV SOLN
5.0000 mg | Freq: Once | INTRAVENOUS | Status: AC
  Administered 2024-08-16: 5 mg via INTRAVENOUS
  Filled 2024-08-16: qty 5

## 2024-08-16 MED ORDER — IPRATROPIUM-ALBUTEROL 0.5-2.5 (3) MG/3ML IN SOLN
3.0000 mL | Freq: Four times a day (QID) | RESPIRATORY_TRACT | Status: DC
  Administered 2024-08-17 – 2024-08-19 (×9): 3 mL via RESPIRATORY_TRACT
  Filled 2024-08-16 (×9): qty 3

## 2024-08-16 MED ORDER — BISOPROLOL FUMARATE 5 MG PO TABS
2.5000 mg | ORAL_TABLET | Freq: Every day | ORAL | Status: DC
  Administered 2024-08-16 – 2024-08-17 (×2): 2.5 mg via ORAL
  Filled 2024-08-16 (×2): qty 0.5

## 2024-08-16 MED ORDER — LEVALBUTEROL HCL 0.63 MG/3ML IN NEBU
0.6300 mg | INHALATION_SOLUTION | RESPIRATORY_TRACT | Status: DC | PRN
  Filled 2024-08-16: qty 3

## 2024-08-16 MED ORDER — ONDANSETRON HCL 4 MG PO TABS: 4.0000 mg | ORAL_TABLET | Freq: Four times a day (QID) | ORAL | Status: DC | PRN

## 2024-08-16 MED ORDER — METHYLPREDNISOLONE SODIUM SUCC 40 MG IJ SOLR
40.0000 mg | Freq: Two times a day (BID) | INTRAMUSCULAR | Status: AC
  Administered 2024-08-17 (×2): 40 mg via INTRAVENOUS
  Filled 2024-08-16 (×2): qty 1

## 2024-08-16 MED ORDER — METHYLPREDNISOLONE SODIUM SUCC 125 MG IJ SOLR
125.0000 mg | Freq: Once | INTRAMUSCULAR | Status: AC
  Administered 2024-08-16: 125 mg via INTRAVENOUS
  Filled 2024-08-16: qty 2

## 2024-08-16 MED ORDER — ONDANSETRON HCL 4 MG/2ML IJ SOLN: 4.0000 mg | Freq: Four times a day (QID) | INTRAMUSCULAR | Status: DC | PRN

## 2024-08-16 NOTE — Assessment & Plan Note (Signed)
 Recurrent improved with TXA nebulizer while in ER appreciate pulmonology consult in a.m. monitor and progressive on continuous pulse ox

## 2024-08-16 NOTE — Assessment & Plan Note (Signed)
Chronic stable continue Pravachol

## 2024-08-16 NOTE — Subjective & Objective (Addendum)
 Patient called EMS because when she is coughing there was small amount of blood on Known history of stage III lung cancer and COPD Started to notice increased blood in her cough Patient started antibiotics At baseline on 2 L of oxygen Known history of diabetes and COPD Has been having ongoing hemoptysis ever since her bronchoscopy in May but is getting gradually worse and has been getting more pronounced Splint so dyspneic has hard time walking up and down the stairs no fevers or chills She does feel so bad that she has not been able to use her DuoNeb Last admission for hemoptysis was in May She has known endobronchial lesion as high risk for bleeding And emergency department patient was given TXA nebulizer

## 2024-08-16 NOTE — Assessment & Plan Note (Signed)
 Continues zebeta  2.5 mg po q day if able would restart Norvasc 

## 2024-08-16 NOTE — Assessment & Plan Note (Signed)
 Defer to pulmonology if repeat bronchoscopy would be helpful patient is followed with oncology will notify oncology patient is being admitted

## 2024-08-16 NOTE — ED Provider Notes (Signed)
    ED Course / MDM   Clinical Course as of 08/16/24 1955  Thu Aug 16, 2024  1546 Hemoglobin(!): 9.7 Simialr to priors [HN]  1546 WBC(!): 12.4 Leukocytosis also similar to priors [HN]  1634 Protime-INR wnl [HN]  1659 Received sign out from Dr. Franklyn pending CTA chest. Hx lung cancer, copd, ILD. Developed small hemoptysis and dyspnea. Will re-assess [WS]  1953 Reassessed patient, still has shortness of breath.  Appears dyspneic.  No further hemoptysis.  Will give additional breathing treatment.  Given persistent dyspnea, suspect patient will benefit from admission for further breathing treatments, antibiotics.  CT with right right upper lobe collapse from known malignancy which could be potential source.  She does have some left basilar crackles also however radiology reports this is more likely to be a metastatic lesion.  Will likely need pulm to consult on her as well.  Discussed with Dr. Silvester who has admitted the patient for further care. [WS]    Clinical Course User Index [HN] Franklyn Sid SAILOR, MD [WS] Francesca Elsie CROME, MD   Medical Decision Making Amount and/or Complexity of Data Reviewed Labs: ordered. Decision-making details documented in ED Course. Radiology: ordered.  Risk Prescription drug management. Decision regarding hospitalization.         Francesca Elsie CROME, MD 08/16/24 KENITH

## 2024-08-16 NOTE — Assessment & Plan Note (Signed)
Will repalce and recheck

## 2024-08-16 NOTE — Assessment & Plan Note (Signed)
 Order sliding scale hold p.o. medications

## 2024-08-16 NOTE — Assessment & Plan Note (Signed)
 Appreciate pulmonology consult

## 2024-08-16 NOTE — Assessment & Plan Note (Signed)
-  -   Will initiate: Steroid taper  -  Antibiotics Rocephin  - Albuterol   PRN, - scheduled duoneb,    -  Mucinex .  Titrate O2 to saturation >90%. Follow patients respiratory status.     VBG without evidence of acute hypercapnia    Currently mentating well no evidence of symptomatic hypercarbia Pulmonology consult Respiratory panel

## 2024-08-16 NOTE — Assessment & Plan Note (Signed)
 Discussed care with cardiology  Will restart home zabeta tonight But for now try a small dose of Cardizem  5 mg IV X1 to see if would help with rate control Appreciate cardiology consult in AM

## 2024-08-16 NOTE — ED Provider Notes (Signed)
 [facility] AT [facility] Provider Note   CSN: [id number] Arrival date & time: 08/16/24  1410     History  No chief complaint on file.   Lindsey Williamson is a 73 y.o. 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  Patient reports stage III lung cancer and COPD with history of hemoptysis s/p bronchoscopy in May.  She endorses increased shortness of breath over the last several days.  She has had cough for the last several weeks but it is also worsening.  Today she has noted new hemoptysis, not large-volume but persistent.  She states she is so dyspneic she is having trouble going up and down her stairs at home.  Denies any fever/chills or chest pain but does endorse upper back pain.  Normally uses her DuoNebs twice a day but did not use it this morning because she was feeling so badly.  Per chart review she presented to the hospital and was admitted from 05/18/2024 to 05/20/2024 for hemoptysis as well.  She had bronchoscopy on 5/24 which noted endobronchial lesion that appears to be high risk for bleeding.   Past Medical History:  Diagnosis Date   Angioedema 10/17/2023   Arthritis    Diabetes mellitus without complication (HCC)    Hypertension        Home Medications Prior to Admission medications   Medication Sig Start Date End Date Taking? Authorizing Provider  albuterol  (VENTOLIN  HFA) 108 (90 Base) MCG/ACT inhaler Inhale 2 puffs into the lungs every 6 (six) hours as needed for wheezing or shortness of breath. 08/07/24   Neda Jennet LABOR, MD  amLODipine  (NORVASC ) 5 MG tablet Take 5 mg by mouth in the morning. 02/08/22   [provider]  aspirin  EC 81 MG tablet Take 81 mg by mouth daily. Swallow whole.    [provider]  bisoprolol  (ZEBETA ) 5 MG tablet Take 2.5 mg by mouth daily.     [provider]  cetirizine  (ZYRTEC  ALLERGY ) 10 MG tablet Take 1 tablet (10 mg total) by mouth daily. 03/08/24   Lorin Norris, MD  dexamethasone  (DECADRON ) 4 MG tablet Take 2 tablets (8mg ) by mouth daily starting the day after carboplatin  for 3 days. Take with food 07/23/24   Sherrod Sherrod, MD  diclofenac Sodium (VOLTAREN) 1 % GEL Apply 1 Application topically daily as needed (pain).    [provider]  doxycycline  (VIBRA -TABS) 100 MG tablet Take 1 tablet (100 mg total) by mouth 2 (two) times daily. 07/30/24   Heilingoetter, Cassandra L, PA-C  empagliflozin  (JARDIANCE ) 25 MG TABS tablet Take 25 mg by mouth daily at 6 PM. 02/08/22   [provider]  ferrous sulfate  325 (65 FE) MG tablet Take 325 mg by mouth daily with breakfast.    [provider]  glipiZIDE  (GLUCOTROL  XL) 5 MG 24 hr tablet Take 1 tablet (5 mg total) by mouth daily with breakfast. 04/11/24   Amin, Ankit C, MD  guaiFENesin -codeine  100-10 MG/5ML syrup Take 5 mLs by mouth 3 (three) times daily as needed for cough. 08/13/24   Heilingoetter, Cassandra L, PA-C  insulin  aspart (NOVOLOG ) 100 UNIT/ML FlexPen Inject 0-11 Units into the skin 3 (three) times daily with meals. Check Blood Glucose (BG) and inject per scale: BG <150= 0 unit; BG 150-200= 1 unit; BG 201-250= 3 unit;  BG 251-300= 5 unit; BG 301-350= 7 unit; BG 351-400= 9 unit; BG >400= 11 unit and Call Primary Care. 04/11/24   Amin, Ankit C, MD  ipratropium-albuterol  (DUONEB) 0.5-2.5 (3) MG/3ML SOLN Take 3 mLs by nebulization in the morning and at bedtime. 05/14/24   Ruthell Lauraine FALCON, NP  levofloxacin  (LEVAQUIN ) 500 MG tablet Take 1 tablet (500 mg total) by mouth daily. 08/13/24   Heilingoetter, Cassandra L, PA-C  lidocaine -prilocaine  (EMLA ) cream Apply to affected area once 07/23/24   Sherrod Sherrod, MD  metFORMIN (GLUCOPHAGE-XR) 500 MG 24 hr tablet Take 1,000 mg by mouth in the morning and at bedtime. 02/08/22   [provider]  omeprazole   (PRILOSEC) 20 MG capsule Take 20 mg by mouth daily.    [provider]  ondansetron  (ZOFRAN ) 8 MG tablet Take 1 tablet (8 mg total) by mouth every 8 (eight) hours as needed for nausea or vomiting. Start on the third day after carboplatin . 07/23/24   Sherrod Sherrod, MD  polyethylene glycol (MIRALAX  / GLYCOLAX ) 17 g packet Take 17 g packet by mouth daily. 02/19/24   Adhikari, Amrit, MD  potassium chloride  SA (KLOR-CON  M) 20 MEQ tablet Take 1 tablet (20 mEq total) by mouth daily. 08/06/24   Sherrod Sherrod, MD  pravastatin  (PRAVACHOL ) 80 MG tablet Take 80 mg by mouth at bedtime.    [provider]  predniSONE  (DELTASONE ) 20 MG tablet Take 40 mg by mouth daily with breakfast.    [provider]  prochlorperazine  (COMPAZINE ) 10 MG tablet Take 1 tablet (10 mg total) by mouth every 6 (six) hours as needed. 07/17/24   Heilingoetter, Cassandra L, PA-C  prochlorperazine  (COMPAZINE ) 10 MG tablet Take 1 tablet (10 mg total) by mouth every 6 (six) hours as needed for nausea or vomiting. 07/23/24   Sherrod Sherrod, MD  umeclidinium-vilanterol (ANORO ELLIPTA ) 62.5-25 MCG/ACT AEPB Inhale 1 puff into the lungs daily. 05/09/24   Ruthell Lauraine FALCON, NP      Allergies    Benzonatate, Lisinopril, Aspirin , Azithromycin , and Codeine     Review of Systems   Review of Systems A 10 point review of systems was performed and is negative unless otherwise reported in HPI.  Physical Exam Updated Vital Signs BP (!) 150/86 (BP Location: Right Arm)   Pulse (!) 103   Temp 97.9 F (36.6 C) (Oral)   Resp 16   Ht 5' 4 (1.626 m)   Wt 66.2 kg   SpO2 94%   BMI 25.06 kg/m  Physical Exam General: Normal appearing female, lying in bed.  HEENT: PERRLA, Sclera anicteric, MMM, trachea midline.  Cardiology: RRR, no murmurs/rubs/gallops.  Resp: Mild tachypnea and dyspnea.  Satting 94% on home 2 L nasal cannula.  Intermittent cough with blood-streaked in the clear sputum. No wheezing.  Abd: Soft, non-tender,  non-distended. No rebound tenderness or guarding.  GU: Deferred. MSK: No peripheral edema or signs of trauma. Extremities without deformity or TTP. No cyanosis or clubbing. Skin: warm, dry.  Neuro: A&Ox4, CNs II-XII grossly intact. MAEs. Sensation grossly intact.  Psych: Normal mood and affect.   ED Results / Procedures / Treatments   Labs (all labs ordered are listed, but only abnormal results are displayed) Labs Reviewed  CBC WITH DIFFERENTIAL/PLATELET - Abnormal; Notable for the following components:      Result Value   WBC 12.4 (*)    RBC 3.86 (*)    Hemoglobin 9.7 (*)    HCT 33.6 (*)    MCH 25.1 (*)  MCHC 28.9 (*)    RDW 17.8 (*)    Platelets 500 (*)    Neutro Abs 10.0 (*)    All other components within normal limits  COMPREHENSIVE METABOLIC PANEL WITH GFR - Abnormal; Notable for the following components:   CO2 17 (*)    Glucose, Bld 154 (*)    Total Protein 8.2 (*)    Albumin 2.8 (*)    All other components within normal limits  BLOOD GAS, VENOUS - Abnormal; Notable for the following components:   pCO2, Ven 32 (*)    pO2, Ven <31 (*)    Bicarbonate 19.8 (*)    Acid-base deficit 4.0 (*)    All other components within normal limits  PROTIME-INR    EKG None  Radiology No results found.  Procedures Procedures    Medications Ordered in ED Medications  tranexamic acid  (CYKLOKAPRON ) 1000 MG/10ML nebulizer solution 500 mg (500 mg Nebulization Given 08/16/24 1632)  ipratropium-albuterol  (DUONEB) 0.5-2.5 (3) MG/3ML nebulizer solution 3 mL (3 mLs Nebulization Given 08/16/24 1617)  lactated ringers  bolus 1,000 mL (1,000 mLs Intravenous New Bag/Given 08/16/24 1617)  iohexol  (OMNIPAQUE ) 350 MG/ML injection 75 mL (75 mLs Intravenous Contrast Given 08/16/24 1653)    ED Course/ Medical Decision Making/ A&P                          Medical Decision Making Amount and/or Complexity of Data Reviewed Labs: ordered. Decision-making details documented in ED  Course. Radiology: ordered.  Risk Prescription drug management.    This patient presents to the ED for concern of cough, shortness of breath, hemoptysis, this involves an extensive number of treatment options, and is a complaint that carries with it a high risk of complications and morbidity.  I considered the following differential and admission for this acute, potentially life threatening condition.   MDM:   For patient's hemoptysis and shortness of breath, must consider: Patient does have COPD and did not get her DuoNebs this morning, she has no wheezing on exam but does feel short of breath, will give a DuoNeb to see if it helps her.  Does have ILD and lung cancer as well which could increase her shortness of breath.  No known heart failure no increased lower extremity edema indicate a CHF.  She has no chest pain but does have back pain, but lower concern for ACS. Consider the known endobronchial lesion associated with her NSCLC, or consider possible bronchitis or pneumonia causing any hemoptysis.  Also must consider pulmonary embolism given patient's tachycardia, increased shortness of breath without any wheezing.  Will obtain a CT PE study for further evaluation.  Will treat hemoptysis with TXA nebulizer.  Clinical Course as of 08/16/24 1714  Thu Aug 16, 2024  1546 Hemoglobin(!): 9.7 Simialr to priors [HN]  1546 WBC(!): 12.4 Leukocytosis also similar to priors [HN]  1634 Protime-INR wnl [HN]  1659 Received sign out from Dr. Franklyn pending CTA chest. Hx lung cancer, copd, ILD. Developed small hemoptysis and dyspnea. Will re-assess [WS]    Clinical Course User Index [HN] Franklyn Sid SAILOR, MD [WS] Francesca Elsie CROME, MD    Labs: I Ordered, and personally interpreted labs.  The pertinent results include: Those listed above  Imaging Studies ordered: I ordered imaging studies including CT PE I independently visualized and interpreted imaging. I agree with the radiologist  interpretation  Additional history obtained from chart review, husband at bedside.   Cardiac Monitoring: The patient was  maintained on a cardiac monitor.  I personally viewed and interpreted the cardiac monitored which showed an underlying rhythm of: Sinus tachycardia  Social Determinants of Health:  lives independently  Disposition:  Patient is signed out to the oncoming ED physician Dr. Francesca who is made aware of her history, presentation, exam, workup, and plan.    Co morbidities that complicate the patient evaluation  Past Medical History:  Diagnosis Date   Angioedema 10/17/2023   Arthritis    Diabetes mellitus without complication (HCC)    Hypertension      Medicines Meds ordered this encounter  Medications   tranexamic acid  (CYKLOKAPRON ) 1000 MG/10ML nebulizer solution 500 mg   ipratropium-albuterol  (DUONEB) 0.5-2.5 (3) MG/3ML nebulizer solution 3 mL   lactated ringers  bolus 1,000 mL   iohexol  (OMNIPAQUE ) 350 MG/ML injection 75 mL    I have reviewed the patients home medicines and have made adjustments as needed  Problem List / ED Course: Problem List Items Addressed This Visit       Other   Hemoptysis   Other Visit Diagnoses       Dyspnea, unspecified type    -  Primary                   This note was created using dictation software, which may contain spelling or grammatical errors.    Franklyn Sid SAILOR, MD 08/16/24 [phone]

## 2024-08-16 NOTE — ED Triage Notes (Signed)
 Pt BIB ems for having blood and small clots while coughing. Pt has stage 3 lung cancer and COPD, Pt states coughing is normal for her but now she's noticing increasing blood in the cough. Pt was taking antibiotics. On home oxygen 2L. Hx of DM, CPOD, lung cancer. A&O x4, VS stable

## 2024-08-16 NOTE — H&P (Signed)
 Lindsey Williamson FMW:[id number] DOB: [birth year] DOA: 08/16/2024     PCP: [name], MD   Outpatient Specialists:   . Pulmonary   Dr. Neda  Oncology   Dr. Lonn    Patient arrived to ER on 08/16/24 at 1410 Referred by Attending Francesca Elsie CROME, MD   Patient coming from:    home Lives  With family    Chief Complaint: COPD and hemoptysis   HPI: Lindsey Williamson is a 73 y.o. 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 chronic respiratory failure, interstitial lung disease, CAD status post CABG    Presented with   hemoptysis, worsening dyspnea wheezing Patient called EMS because when she is coughing there was small amount of blood on Known history of stage III lung cancer and COPD Started to notice increased blood in her cough Patient started antibiotics At baseline on 2 L of oxygen Known history of diabetes and COPD Has been having ongoing hemoptysis ever since her bronchoscopy in May but is getting gradually worse and has been getting more pronounced Splint so dyspneic has hard time walking up and down the stairs no fevers or chills She does feel so bad that she has not been able to use her DuoNeb Last admission for hemoptysis was in May She has known endobronchial lesion as high risk for bleeding And emergency department patient was given TXA nebulizer Patient was treated for possible COPD exacerbation with Decadron  and doxycycline  with some improvement but not full resolution She was switched to Levaquin  on 18 August  Reports her HR has been up ever since she have had her last chemo  She has been treated fro dehydration and it has helped her heart rate in the past   Denies significant ETOH intake   Does not smoke      Regarding pertinent Chronic problems:     Hyperlipidemia - on statins Pravachol  (pravastatin )  Lipid Panel     Component Value Date/Time   CHOL 117 04/07/2024  0436   TRIG 54 04/07/2024 0436   HDL 46 04/07/2024 0436   CHOLHDL 2.5 04/07/2024 0436   VLDL 11 04/07/2024 0436   LDLCALC 60 04/07/2024 0436     HTN on Norvasc  bisoprolol     last echo  Recent Results (from the past 56199 hours)  ECHOCARDIOGRAM COMPLETE   Collection Time: 02/15/24  2:05 PM  Result Value   Weight 2,338.64   Height 64   BP 152/84   S' Lateral 2.35   Est EF 65 - 70%   Narrative      ECHOCARDIOGRAM REPORT       IMPRESSIONS    1. Left ventricular ejection fraction, by estimation, is 65 to 70%. The left ventricle has normal function. The left ventricle has no regional wall motion abnormalities. There is mild left ventricular hypertrophy. Left ventricular diastolic parameters  are indeterminate.  2. Right ventricular systolic function is normal. The right ventricular size is normal. There is mildly elevated pulmonary artery systolic pressure. The estimated right ventricular systolic pressure is 38.5 mmHg.  3. The mitral valve is degenerative. No evidence of mitral valve regurgitation. No evidence of mitral stenosis.  4. The aortic valve is tricuspid. There is mild calcification of the aortic valve. Aortic valve regurgitation is not visualized. Aortic valve sclerosis is present, with no evidence of aortic valve stenosis.  5. The inferior vena cava is normal in size with  greater than 50% respiratory variability, suggesting right atrial pressure of 3 mmHg.             CAD  - On Aspirin , statin, betablocker,                  -  followed by cardiology                        DM 2 -  Lab Results  Component Value Date   HGBA1C 7.4 (H) 02/16/2024   PO meds only glipizide  metformin        COPD -  followed by pulmonology  t  on baseline oxygen 2L,        Chronic anemia - baseline hg Hemoglobin & Hematocrit  Recent Labs    08/01/24 1515 08/13/24 0843 08/16/24 1529  HGB 10.2* 10.0* 9.7*     Cancer: Known history of lung cancer with hemoptysis  While in  ER: Clinical Course as of 08/16/24 2000  Thu Aug 16, 2024  1546 Hemoglobin(!): 9.7 Simialr to priors [HN]  1546 WBC(!): 12.4 Leukocytosis also similar to priors [HN]  1634 Protime-INR wnl [HN]  1659 Received sign out from Dr. Franklyn pending CTA chest. Hx lung cancer, copd, ILD. Developed small hemoptysis and dyspnea. Will re-assess [WS]  1953 Reassessed patient, still has shortness of breath.  Appears dyspneic.  No further hemoptysis.  Will give additional breathing treatment.  Given persistent dyspnea, suspect patient will benefit from admission for further breathing treatments, antibiotics.  CT with right right upper lobe collapse from known malignancy which could be potential source.  She does have some left basilar crackles also however radiology reports this is more likely to be a metastatic lesion.  Will likely need pulm to consult on her as well.  Discussed with Dr. Silvester who has admitted the patient for further care. [WS]    Clinical Course User Index [HN] Franklyn Sid SAILOR, MD [WS] Francesca Elsie CROME, MD         Lab Orders         CBC with Differential         Comprehensive metabolic panel         Blood gas, venous (at  [facility])         Protime-INR       CTA chest -  no PE, mass extending into the right lower lobe bronchus with occlusion right pleural effusion with atelectasis or infiltrate 3.7 cm mass in the left lower lobe likely metastatic disease   Following Medications were ordered in ER: Medications  cefTRIAXone  (ROCEPHIN ) 2 g in sodium chloride  0.9 % 100 mL IVPB (2 g Intravenous New Bag/Given 08/16/24 1919)  doxycycline  (VIBRAMYCIN ) 100 mg in sodium chloride  0.9 % 250 mL IVPB (has no administration in time range)  tranexamic acid  (CYKLOKAPRON ) 1000 MG/10ML nebulizer solution 500 mg (500 mg Nebulization Given 08/16/24 1632)  ipratropium-albuterol  (DUONEB) 0.5-2.5 (3) MG/3ML nebulizer solution 3 mL (3 mLs Nebulization Given 08/16/24 1617)  lactated ringers  bolus 1,000  mL (1,000 mLs Intravenous New Bag/Given 08/16/24 1617)  iohexol  (OMNIPAQUE ) 350 MG/ML injection 75 mL (75 mLs Intravenous Contrast Given 08/16/24 1653)  ipratropium-albuterol  (DUONEB) 0.5-2.5 (3) MG/3ML nebulizer solution 3 mL (3 mLs Nebulization Given 08/16/24 1923)  methylPREDNISolone  sodium succinate (SOLU-MEDROL ) 125 mg/2 mL injection 125 mg (125 mg Intravenous Given 08/16/24 1929)       ED Triage Vitals  Encounter Vitals Group     BP 08/16/24 1452 ROLLEN)  150/86     Girls Systolic BP Percentile --      Girls Diastolic BP Percentile --      Boys Systolic BP Percentile --      Boys Diastolic BP Percentile --      Pulse Rate 08/16/24 1452 (!) 103     Resp 08/16/24 1452 16     Temp 08/16/24 1452 97.9 F (36.6 C)     Temp Source 08/16/24 1452 Oral     SpO2 08/16/24 1428 92 %     Weight 08/16/24 1434 146 lb (66.2 kg)     Height 08/16/24 1434 5' 4 (1.626 m)     Head Circumference --      Peak Flow --      Pain Score 08/16/24 1433 0     Pain Loc --      Pain Education --      Exclude from Growth Chart --   UFJK(75)@     _________________________________________ Significant initial  Findings: Abnormal Labs Reviewed  CBC WITH DIFFERENTIAL/PLATELET - Abnormal; Notable for the following components:      Result Value   WBC 12.4 (*)    RBC 3.86 (*)    Hemoglobin 9.7 (*)    HCT 33.6 (*)    MCH 25.1 (*)    MCHC 28.9 (*)    RDW 17.8 (*)    Platelets 500 (*)    Neutro Abs 10.0 (*)    All other components within normal limits  COMPREHENSIVE METABOLIC PANEL WITH GFR - Abnormal; Notable for the following components:   CO2 17 (*)    Glucose, Bld 154 (*)    Total Protein 8.2 (*)    Albumin 2.8 (*)    All other components within normal limits  BLOOD GAS, VENOUS - Abnormal; Notable for the following components:   pCO2, Ven 32 (*)    pO2, Ven <31 (*)    Bicarbonate 19.8 (*)    Acid-base deficit 4.0 (*)    All other components within normal limits   ______________   Cardiac Panel (last 3  results) Recent Labs    08/16/24 2002  CKTOTAL 27*     ECG: Ordered Personally reviewed and interpreted by me showing: HR : 138 Rhythm: Sinus or ectopic atrial tachycardia Borderline right axis deviation Prolonged QT interval QTC 554  Now that she stopped coughing back into sinus tachycardia HR at 107  BNP (last 3 results) Recent Labs    04/06/24 1807 04/10/24 0411  BNP 141.6* 90.9      The recent clinical data is shown below. Vitals:   08/16/24 1433 08/16/24 1434 08/16/24 1452 08/16/24 1853  BP:   (!) 150/86 (!) 151/88  Pulse:   (!) 103 (!) 133  Resp:   16 (!) 35  Temp:   97.9 F (36.6 C) 97.7 F (36.5 C)  TempSrc:   Oral Oral  SpO2: 92%  94% 95%  Weight:  66.2 kg    Height:  5' 4 (1.626 m)       WBC     Component Value Date/Time   WBC 12.4 (H) 08/16/2024 1529   LYMPHSABS 1.2 08/16/2024 1529   MONOABS 1.0 08/16/2024 1529   EOSABS 0.1 08/16/2024 1529   BASOSABS 0.1 08/16/2024 1529       Results for orders placed or performed during the hospital encounter of 05/18/24  Culture, Respiratory w Gram Stain     Status: None   Collection Time: 05/19/24 12:31 PM  Specimen: Bronchial Alveolar Lavage; Respiratory  Result Value Ref Range Status   Specimen Description   Final    BRONCHIAL WASHINGS Performed at [facility], [address] [place], [place] [address]    Special Requests   Final    NONE Performed at [facility], [address], [place], [place] [address]    Gram Stain   Final    RARE WBC PRESENT, PREDOMINANTLY PMN NO ORGANISMS SEEN    Culture   Final    RARE Normal respiratory flora-no Staph aureus or Pseudomonas seen Performed at [facility], [address], [place], [place] [address]    Report Status 05/24/2024 FINAL  Final    ABX started Antibiotics Given (last 72 hours)     Date/Time Action Medication Dose Rate   08/16/24 1919 New Bag/Given   cefTRIAXone  (ROCEPHIN ) 2 g in sodium  chloride 0.9 % 100 mL IVPB 2 g 200 mL/hr        No results found for the last 90 days.    Venous  Blood Gas result:    7.4 Acid-base deficit 4.0 High  mmol/L   pCO2, Ven 32 Low  mmHg O2 Saturation 36.5 %  pO2, Ven <31 Low Panic  mmHg  Patient temperature 37.0   Bicarbonate 19.8 Low  mmol/L     __________________________________________________________ Recent Labs  Lab 08/13/24 0843 08/16/24 1529  NA 136 139  K 3.7 3.9  CO2 20* 17*  GLUCOSE 224* 154*  BUN 10 11  CREATININE 0.59 0.61  CALCIUM 9.1 9.6    Cr   stable,   Lab Results  Component Value Date   CREATININE 0.61 08/16/2024   CREATININE 0.59 08/13/2024   CREATININE 0.96 08/01/2024    Recent Labs  Lab 08/13/24 0843 08/16/24 1529  AST 11* 15  ALT <5 <5  ALKPHOS 87 82  BILITOT 0.3 0.9  PROT 7.4 8.2*  ALBUMIN 3.5 2.8*   Lab Results  Component Value Date   CALCIUM 9.6 08/16/2024   PHOS 2.5 05/20/2024     Plt: Lab Results  Component Value Date   PLT 500 (H) 08/16/2024         Recent Labs  Lab 08/13/24 0843 08/16/24 1529  WBC 10.9* 12.4*  NEUTROABS 8.6* 10.0*  HGB 10.0* 9.7*  HCT 32.1* 33.6*  MCV 83.4 87.0  PLT 440* 500*    HG/HCT  stable,      Component Value Date/Time   HGB 9.7 (L) 08/16/2024 1529   HGB 10.0 (L) 08/13/2024 0843   HCT 33.6 (L) 08/16/2024 1529   MCV 87.0 08/16/2024 1529     _________________________ Hospitalist was called for admission for   Dyspnea,  Hemoptysis    The following Work up has been ordered so far:  Orders Placed This Encounter  Procedures   CT Angio Chest PE W and/or Wo Contrast   CBC with Differential   Comprehensive metabolic panel   Blood gas, venous (at [facility] and [facility])   Protime-INR   ED Cardiac monitoring   Consult to hospitalist   Pulse oximetry, continuous   ED EKG     OTHER Significant initial  Findings:  labs showing:     DM  labs:  HbA1C: Recent Labs    10/17/23 2327 02/16/24 0559  HGBA1C 8.3* 7.4*       CBG (last 3)  No  results for input(s): GLUCAP in the last 72 hours.        Cultures:  Component Value Date/Time   SDES  05/19/2024 1231    BRONCHIAL WASHINGS Performed at [facility], [address], [place], [place] [address]    SPECREQUEST  05/19/2024 1231    NONE Performed at [facility], [address], [place], [place] [address]    CULT  05/19/2024 1231    RARE Normal respiratory flora-no Staph aureus or Pseudomonas seen Performed at [facility], [address], [place], [place] [address]    REPTSTATUS 05/24/2024 FINAL 05/19/2024 1231     Radiological Exams on Admission: CT Angio Chest PE W and/or Wo Contrast Result Date: 08/16/2024 CLINICAL DATA:  Shortness of breath, hemoptysis. History of lung cancer. EXAM: CT ANGIOGRAPHY CHEST WITH CONTRAST TECHNIQUE: Multidetector CT imaging of the chest was performed using the standard protocol during bolus administration of intravenous contrast. Multiplanar CT image reconstructions and MIPs were obtained to evaluate the vascular anatomy. RADIATION DOSE REDUCTION: This exam was performed according to the departmental dose-optimization program which includes automated exposure control, adjustment of the mA and/or kV according to patient size and/or use of iterative reconstruction technique. CONTRAST:  75mL OMNIPAQUE  IOHEXOL  350 MG/ML SOLN COMPARISON:  July 16, 2024.  June 28, 2024. FINDINGS: Cardiovascular: Satisfactory opacification of the pulmonary arteries to the segmental level. No evidence of pulmonary embolism. Mild cardiomegaly. No pericardial effusion. Mediastinum/Nodes: Thyroid  gland is unremarkable. The esophagus is unremarkable. Extensive adenopathy is noted in the subcarinal and right hilar regions as noted on recent PET scan. This mass appears to extend into and occlude the right lower lobe bronchus proximally. Lungs/Pleura: No pneumothorax is noted. Emphysematous disease is noted. 3.7 cm mass noted  posteriorly in left lower lobe consistent with metastatic disease. Moderate size right pleural effusion is noted with adjacent atelectasis or infiltrate of residual right upper lobe. Upper Abdomen: No acute abnormality. Musculoskeletal: No chest wall abnormality. No acute or significant osseous findings. Review of the MIP images confirms the above findings. IMPRESSION: 1. No definite evidence of pulmonary embolus. 2. Extensive subcarinal and right hilar adenopathy is noted as noted on recent PET scan. This mass appears to extend into and occlude the right lower lobe bronchus proximally. 3. Moderate size right pleural effusion is noted with adjacent atelectasis or infiltrate of residual right upper lobe. 4. 3.7 cm mass noted posteriorly in left lower lobe consistent with metastatic disease. 5. Emphysema. Aortic Atherosclerosis (ICD10-I70.0) and Emphysema (ICD10-J43.9). Electronically Signed   By: Lynwood Landy Raddle M.D.   On: 08/16/2024 17:30   _______________________________________________________________________________________________________ Latest  Blood pressure (!) 151/88, pulse (!) 133, temperature 97.7 F (36.5 C), temperature source Oral, resp. rate (!) 35, height 5' 4 (1.626 m), weight 66.2 kg, SpO2 95%.   Vitals  labs and radiology finding personally reviewed  Review of Systems:    Pertinent positives include:  wheezing.coughing up of blood.  shortness of breath at rest.  dyspnea on exertion, excess mucus Constitutional:  No weight loss, night sweats, Fevers, chills, fatigue, weight loss  HEENT:  No headaches, Difficulty swallowing,Tooth/dental problems,Sore throat,  No sneezing, itching, ear ache, nasal congestion, post nasal drip,  Cardio-vascular:  No chest pain, Orthopnea, PND, anasarca, dizziness, palpitations.no Bilateral lower extremity swelling  GI:  No heartburn, indigestion, abdominal pain, nausea, vomiting, diarrhea, change in bowel habits, loss of appetite, melena, blood in  stool, hematemesis Resp:    no productive cough, No non-productive cough,   No change in color of mucus.No  Skin:  no rash or lesions. No jaundice GU:  no dysuria, change in color of urine, no urgency or frequency. No straining to urinate.  No flank pain.  Musculoskeletal:  No joint pain or no joint swelling. No decreased range of motion. No back pain.  Psych:  No change in mood or affect. No depression or anxiety. No memory loss.  Neuro: no localizing neurological complaints, no tingling, no weakness, no double vision, no gait abnormality, no slurred speech, no confusion  All systems reviewed and apart from HOPI all are negative _______________________________________________________________________________________________ Past Medical History:   Past Medical History:  Diagnosis Date   Angioedema 10/17/2023   Arthritis    Diabetes mellitus without complication (HCC)    Hypertension       Past Surgical History:  Procedure Laterality Date   BRONCHIAL BIOPSY  03/30/2022   Procedure: BRONCHIAL BIOPSIES;  Surgeon: Brenna Adine CROME, DO;  Location: [facility] ENDOSCOPY;  Service: Pulmonary;;   BRONCHIAL BIOPSY  10/17/2023   Procedure: BRONCHIAL BIOPSIES;  Surgeon: Shelah Lamar RAMAN, MD;  Location: [facility] ENDOSCOPY;  Service: Pulmonary;;   BRONCHIAL BRUSHINGS  03/30/2022   Procedure: BRONCHIAL BRUSHINGS;  Surgeon: Brenna Adine CROME, DO;  Location: [facility] ENDOSCOPY;  Service: Pulmonary;;   BRONCHIAL BRUSHINGS  10/17/2023   Procedure: BRONCHIAL BRUSHINGS;  Surgeon: Shelah Lamar RAMAN, MD;  Location: [facility] ENDOSCOPY;  Service: Pulmonary;;   BRONCHIAL NEEDLE ASPIRATION BIOPSY  03/30/2022   Procedure: BRONCHIAL NEEDLE ASPIRATION BIOPSIES;  Surgeon: Brenna Adine CROME, DO;  Location: [facility] ENDOSCOPY;  Service: Pulmonary;;   BRONCHIAL NEEDLE ASPIRATION BIOPSY  10/17/2023   Procedure: BRONCHIAL NEEDLE ASPIRATION BIOPSIES;  Surgeon: Shelah Lamar RAMAN, MD;  Location: [facility] ENDOSCOPY;  Service: Pulmonary;;   BRONCHIAL WASHINGS   05/19/2024   Procedure: IRRIGATION, BRONCHUS;  Surgeon: Jude Harden GAILS, MD;  Location: [facility] ENDOSCOPY;  Service: Cardiopulmonary;;   EYE SURGERY     FIDUCIAL MARKER PLACEMENT  03/30/2022   Procedure: FIDUCIAL MARKER PLACEMENT;  Surgeon: Brenna Adine CROME, DO;  Location: [facility] ENDOSCOPY;  Service: Pulmonary;;   FINE NEEDLE ASPIRATION  10/17/2023   Procedure: FINE NEEDLE ASPIRATION;  Surgeon: Shelah Lamar RAMAN, MD;  Location: [facility] ENDOSCOPY;  Service: Pulmonary;;   FOREIGN BODY REMOVAL  03/30/2022   Procedure: FOREIGN BODY REMOVAL;  Surgeon: Brenna Adine CROME, DO;  Location: [facility] ENDOSCOPY;  Service: Pulmonary;;   HEMOSTASIS CONTROL  10/17/2023   Procedure: HEMOSTASIS CONTROL;  Surgeon: Shelah Lamar RAMAN, MD;  Location: [facility] ENDOSCOPY;  Service: Pulmonary;;   VIDEO BRONCHOSCOPY Bilateral 05/19/2024   Procedure: VIDEO BRONCHOSCOPY WITHOUT FLUORO;  Surgeon: Jude Harden GAILS, MD;  Location: [facility] ENDOSCOPY;  Service: Cardiopulmonary;  Laterality: Bilateral;   VIDEO BRONCHOSCOPY WITH ENDOBRONCHIAL ULTRASOUND Bilateral 03/30/2022   Procedure: VIDEO BRONCHOSCOPY WITH ENDOBRONCHIAL ULTRASOUND;  Surgeon: Brenna Adine CROME, DO;  Location: [facility] ENDOSCOPY;  Service: Pulmonary;  Laterality: Bilateral;   VIDEO BRONCHOSCOPY WITH ENDOBRONCHIAL ULTRASOUND Right 10/17/2023   Procedure: VIDEO BRONCHOSCOPY WITH ENDOBRONCHIAL ULTRASOUND;  Surgeon: Shelah Lamar RAMAN, MD;  Location: [facility] ENDOSCOPY;  Service: Pulmonary;  Laterality: Right;   VIDEO BRONCHOSCOPY WITH RADIAL ENDOBRONCHIAL ULTRASOUND  03/30/2022   Procedure: VIDEO BRONCHOSCOPY WITH RADIAL ENDOBRONCHIAL ULTRASOUND;  Surgeon: Brenna Adine CROME, DO;  Location: [facility] ENDOSCOPY;  Service: Pulmonary;;    Social History:  Ambulatory   walker    reports that she quit smoking about 19 months ago. Her smoking use included cigarettes. She has been exposed to tobacco smoke. She has never used smokeless tobacco. She reports that she does not drink alcohol and does not use drugs.   Family History:  Family  History  Problem Relation Age of Onset   Allergic rhinitis Sister    Asthma Sister    Angioedema Neg Hx    Atopy Neg Hx    Immunodeficiency Neg Hx    Urticaria Neg Hx    Eczema Neg Hx    ______________________________________________________________________________________________ Allergies: Allergies  Allergen Reactions   Benzonatate Palpitations   Lisinopril Swelling and Other (See Comments)    Patient ended up in the ED with a swollen mouth and tongue   Aspirin  Nausea And Vomiting and Palpitations    Can tolerate baby aspirin  81 mg without difficulty but can't tolerate higher dosages   Azithromycin  Palpitations   Codeine  Palpitations     Prior to Admission medications   Medication Sig Start Date End Date Taking? Authorizing Provider  albuterol  (VENTOLIN  HFA) 108 (90 Base) MCG/ACT inhaler Inhale 2 puffs into the lungs every 6 (six) hours as needed for wheezing or shortness of breath. 08/07/24   Olalere, Jennet LABOR, MD  amLODipine  (NORVASC ) 5 MG tablet Take 5 mg by mouth in the morning. 02/08/22   [provider]  aspirin  EC 81 MG tablet Take 81 mg by mouth daily. Swallow whole.    [provider]  bisoprolol  (ZEBETA ) 5 MG tablet Take 2.5 mg by mouth daily.    [provider]  cetirizine  (ZYRTEC  ALLERGY ) 10 MG tablet Take 1 tablet (10 mg total) by mouth daily. 03/08/24   Lorin Norris, MD  dexamethasone  (DECADRON ) 4 MG tablet Take 2 tablets (8mg ) by mouth daily starting the day after carboplatin  for 3 days. Take with food 07/23/24   Sherrod Sherrod, MD  diclofenac Sodium (VOLTAREN) 1 % GEL Apply 1 Application topically daily as needed (pain).    [provider]  doxycycline  (VIBRA -TABS) 100 MG tablet Take 1 tablet (100 mg total) by mouth 2 (two) times daily. 07/30/24   Heilingoetter, Cassandra L, PA-C  empagliflozin  (JARDIANCE ) 25 MG TABS tablet Take 25 mg by mouth daily at 6 PM. 02/08/22   [provider]  ferrous sulfate  325 (65 FE) MG  tablet Take 325 mg by mouth daily with breakfast.    [provider]  glipiZIDE  (GLUCOTROL  XL) 5 MG 24 hr tablet Take 1 tablet (5 mg total) by mouth daily with breakfast. 04/11/24   Amin, Ankit C, MD  guaiFENesin -codeine  100-10 MG/5ML syrup Take 5 mLs by mouth 3 (three) times daily as needed for cough. 08/13/24   Heilingoetter, Cassandra L, PA-C  insulin  aspart (NOVOLOG ) 100 UNIT/ML FlexPen Inject 0-11 Units into the skin 3 (three) times daily with meals. Check Blood Glucose (BG) and inject per scale: BG <150= 0 unit; BG 150-200= 1 unit; BG 201-250= 3 unit; BG 251-300= 5 unit; BG 301-350= 7 unit; BG 351-400= 9 unit; BG >400= 11 unit and Call Primary Care. 04/11/24   Amin, Ankit C, MD  ipratropium-albuterol  (DUONEB) 0.5-2.5 (3) MG/3ML SOLN Take 3 mLs by nebulization in the morning and at bedtime. 05/14/24   Ruthell Lauraine FALCON, NP  levofloxacin  (LEVAQUIN ) 500 MG tablet Take 1 tablet (500 mg total) by mouth daily. 08/13/24   Heilingoetter, Cassandra L, PA-C  lidocaine -prilocaine  (EMLA ) cream Apply to affected area once 07/23/24   Sherrod Sherrod, MD  metFORMIN (GLUCOPHAGE-XR) 500 MG 24 hr tablet Take 1,000 mg by mouth in the morning and at bedtime. 02/08/22   [provider]  omeprazole  (PRILOSEC) 20 MG capsule Take 20 mg by mouth daily.    [provider]  ondansetron  (ZOFRAN ) 8 MG tablet  Take 1 tablet (8 mg total) by mouth every 8 (eight) hours as needed for nausea or vomiting. Start on the third day after carboplatin . 07/23/24   Sherrod Sherrod, MD  polyethylene glycol (MIRALAX  / GLYCOLAX ) 17 g packet Take 17 g packet by mouth daily. 02/19/24   Jillian Buttery, MD  potassium chloride  SA (KLOR-CON  M) 20 MEQ tablet Take 1 tablet (20 mEq total) by mouth daily. 08/06/24   Sherrod Sherrod, MD  pravastatin  (PRAVACHOL ) 80 MG tablet Take 80 mg by mouth at bedtime.    [provider]  predniSONE  (DELTASONE ) 20 MG tablet Take 40 mg by mouth daily with breakfast.    [provider]  prochlorperazine  (COMPAZINE ) 10 MG tablet Take 1 tablet (10 mg total) by mouth every 6 (six) hours as needed. 07/17/24   Heilingoetter, Cassandra L, PA-C  prochlorperazine  (COMPAZINE ) 10 MG tablet Take 1 tablet (10 mg total) by mouth every 6 (six) hours as needed for nausea or vomiting. 07/23/24   Sherrod Sherrod, MD  umeclidinium-vilanterol (ANORO ELLIPTA ) 62.5-25 MCG/ACT AEPB Inhale 1 puff into the lungs daily. 05/09/24   Ruthell Lauraine FALCON, NP    ___________________________________________________________________________________________________ Physical Exam:    08/16/2024    6:53 PM 08/16/2024    2:52 PM 08/16/2024    2:34 PM  Vitals with BMI  Height   5' 4  Weight   146 lbs  BMI   25.05  Systolic 151 150   Diastolic 88 86   Pulse 133 103      1. General:  in No  Acute distress   Chronically ill  -appearing 2. Psychological: Alert and   Oriented 3. Head/ENT:  Dry Mucous Membranes                          Head Non traumatic, neck supple                          Poor Dentition 4. SKIN:  decreased Skin turgor,  Skin clean Dry and intact no rash    5. Heart: Regular rate and rhythm no  Murmur, no Rub or gallop 6. Lungs:  occASIONAL wheezes SOME crackles   7. Abdomen: Soft,  non-tender, Non distended bowel sounds present 8. Lower extremities: no clubbing, cyanosis, no  edema 9. Neurologically Grossly intact, moving all 4 extremities equally   10. MSK: Normal range of motion    Chart has been reviewed  ______________________________________________________________________________________________  Assessment/Plan 73 y.o. 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 chronic respiratory failure, interstitial lung disease, CAD status post CABG   Admitted for COPD exacerbation and hemoptysis in the setting of known metastatic lung cancer with worsening dyspnea    Present on Admission:  COPD exacerbation  (HCC)  Hemoptysis  Hyperlipidemia  Hypertension  ILD (interstitial lung disease) (HCC)  Primary cancer of right lower lobe of lung (HCC)  Type 2 diabetes mellitus with microalbuminuria (HCC)  Hypomagnesemia  Prolonged QT interval  Atrial tachycardia (HCC)    COPD exacerbation (HCC)  -  - Will initiate: Steroid taper  -  Antibiotics Rocephin  - Albuterol   PRN, - scheduled duoneb,    -  Mucinex .  Titrate O2 to saturation >90%. Follow patients respiratory status.     VBG without evidence of acute hypercapnia    Currently mentating well no evidence of symptomatic hypercarbia Pulmonology consult  Respiratory panel   Hemoptysis Recurrent improved with TXA nebulizer while in ER appreciate pulmonology consult in a.m. monitor and progressive on continuous pulse ox  Hyperlipidemia Chronic stable continue Pravachol   Hypertension Continues zebeta  2.5 mg po q day if able would restart Norvasc   ILD (interstitial lung disease) (HCC) Appreciate pulmonology consult  Primary cancer of right lower lobe of lung (HCC) Defer to pulmonology if repeat bronchoscopy would be helpful patient is followed with oncology will notify oncology patient is being admitted  Type 2 diabetes mellitus with microalbuminuria (HCC) Order sliding scale hold p.o. medications  Hypomagnesemia Will repalce and recheck  Prolonged QT interval - will monitor on tele avoid QT prolonging medications, rehydrate correct electrolytes   Atrial tachycardia (HCC) Discussed care with cardiology  Will restart home zabeta tonight But for now try a small dose of Cardizem  5 mg IV X1 to see if would help with rate control Appreciate cardiology consult in AM   Other plan as per orders.  DVT prophylaxis:  SCD     Code Status:    Code Status: Prior FULL CODE as per patient   I had personally discussed CODE STATUS with patient     ACP   none     Family Communication:   Family not at  Bedside    Diet n.p.o.  postmidnight otherwise diabetic diet   Disposition Plan:     To home once workup is complete and patient is stable   Following barriers for discharge:                                                         Pain controlled with PO medications                          , white count improving able to transition to PO antibiotics                                                    Will need consultants to evaluate patient prior to discharge                           Work of breathing improves       Consult Orders  (From admission, onward)           Start     Ordered   08/16/24 1850  Consult to hospitalist  Once       Provider:  (Not yet assigned)  Question Answer Comment  Place call to: [facility]   Reason for Consult Admit      08/16/24 1849                               Would benefit from PT/OT eval prior to DC  Ordered                                      Consults called: Pulmonology in the morning Sent message to  oncology Sent email to cardiology    Admission status:  ED Disposition     ED Disposition  Admit   Condition  --   Comment  Hospital [facility] [[id number]]  Level of Care: Progressive [102]  Admit to Progressive based on following criteria: RESPIRATORY PROBLEMS hypoxemic/hypercapnic respiratory failure that is responsive to NIPPV (BiPAP) or High Flow Nasal Cannula (6-80 lpm). Frequent assessment/intervention, no > Q2 hrs < Q4 hrs, to maintain oxygenation and pulmonary hygiene.  May admit patient to [facility] or [facility] if equivalent level of care is available:: No  Covid Evaluation: Asymptomatic - no recent exposure (last 10 days) testing not required  Diagnosis: COPD exacerbation [facility]) [[id number]]  Admitting Physician: Adyn Hoes [[id number]]  Attending Physician: Kamryn Messineo [[id number]]  Certification:: I certify this patient will need inpatient services for at least 2 midnights  Expected Medical Readiness:  08/18/2024            inpatient     I Expect 2 midnight stay secondary to severity of patient's current illness need for inpatient interventions justified by the following:  hemodynamic instability despite optimal treatment ( tachypnea )   Severe lab/radiological/exam abnormalities including:   Copd exacerbation  Hemoptysis    and extensive comorbidities including:  DM2   COPD/asthma   malignancy,  That are currently affecting medical management.   I expect  patient to be hospitalized for 2 midnights requiring inpatient medical care.  Patient is at high risk for adverse outcome (such as loss of life or disability) if not treated.  Indication for inpatient stay as follows:  New or worsening hypoxia    Need for IV antibiotics, IV fluids,, IV pain medications,     Level of care   progressive   Fallan Mccarey 08/16/2024, 10:06 PM    [facility]     after 2 AM please page floor coverage   If 7AM-7PM, please contact the day team taking care of the patient using [email or web address]

## 2024-08-16 NOTE — Assessment & Plan Note (Signed)
-  will monitor on tele avoid QT prolonging medications, rehydrate correct electrolytes ? ?

## 2024-08-17 ENCOUNTER — Other Ambulatory Visit: Payer: Self-pay

## 2024-08-17 DIAGNOSIS — R000 Tachycardia, unspecified: Secondary | ICD-10-CM | POA: Diagnosis not present

## 2024-08-17 DIAGNOSIS — I10 Essential (primary) hypertension: Secondary | ICD-10-CM | POA: Diagnosis not present

## 2024-08-17 DIAGNOSIS — J441 Chronic obstructive pulmonary disease with (acute) exacerbation: Secondary | ICD-10-CM | POA: Diagnosis not present

## 2024-08-17 DIAGNOSIS — R042 Hemoptysis: Secondary | ICD-10-CM | POA: Diagnosis not present

## 2024-08-17 DIAGNOSIS — J90 Pleural effusion, not elsewhere classified: Secondary | ICD-10-CM

## 2024-08-17 DIAGNOSIS — C3431 Malignant neoplasm of lower lobe, right bronchus or lung: Secondary | ICD-10-CM | POA: Diagnosis not present

## 2024-08-17 LAB — PHOSPHORUS: Phosphorus: 3.5 mg/dL (ref 2.5–4.6)

## 2024-08-17 LAB — IRON AND TIBC
Iron: 19 ug/dL — ABNORMAL LOW (ref 28–170)
Saturation Ratios: 8 % — ABNORMAL LOW (ref 10.4–31.8)
TIBC: 241 ug/dL — ABNORMAL LOW (ref 250–450)
UIBC: 222 ug/dL

## 2024-08-17 LAB — RETICULOCYTES
Immature Retic Fract: 26.4 % — ABNORMAL HIGH (ref 2.3–15.9)
RBC.: 3.55 MIL/uL — ABNORMAL LOW (ref 3.87–5.11)
Retic Count, Absolute: 76.7 K/uL (ref 19.0–186.0)
Retic Ct Pct: 2.2 % (ref 0.4–3.1)

## 2024-08-17 LAB — MAGNESIUM: Magnesium: 2 mg/dL (ref 1.7–2.4)

## 2024-08-17 LAB — CBG MONITORING, ED
Glucose-Capillary: 204 mg/dL — ABNORMAL HIGH (ref 70–99)
Glucose-Capillary: 203 mg/dL — ABNORMAL HIGH (ref 70–99)

## 2024-08-17 LAB — HEMOGLOBIN A1C
Hgb A1c MFr Bld: 9.3 % — ABNORMAL HIGH (ref 4.8–5.6)
Mean Plasma Glucose: 220.21 mg/dL

## 2024-08-17 LAB — COMPREHENSIVE METABOLIC PANEL WITH GFR
ALT: 5 U/L (ref 0–44)
AST: 11 U/L — ABNORMAL LOW (ref 15–41)
Albumin: 2.6 g/dL — ABNORMAL LOW (ref 3.5–5.0)
Alkaline Phosphatase: 75 U/L (ref 38–126)
Anion gap: 19 — ABNORMAL HIGH (ref 5–15)
BUN: 12 mg/dL (ref 8–23)
CO2: 12 mmol/L — ABNORMAL LOW (ref 22–32)
Calcium: 8.8 mg/dL — ABNORMAL LOW (ref 8.9–10.3)
Chloride: 102 mmol/L (ref 98–111)
Creatinine, Ser: 0.67 mg/dL (ref 0.44–1.00)
GFR, Estimated: >60 mL/min (ref >60)
Glucose, Bld: 231 mg/dL — ABNORMAL HIGH (ref 70–99)
Potassium: 4.1 mmol/L (ref 3.5–5.1)
Sodium: 133 mmol/L — ABNORMAL LOW (ref 135–145)
Total Bilirubin: 1.2 mg/dL (ref 0.0–1.2)
Total Protein: 7.5 g/dL (ref 6.5–8.1)

## 2024-08-17 LAB — CBC
HCT: 30.8 % — ABNORMAL LOW (ref 36.0–46.0)
Hemoglobin: 9 g/dL — ABNORMAL LOW (ref 12.0–15.0)
MCH: 25.9 pg — ABNORMAL LOW (ref 26.0–34.0)
MCHC: 29.2 g/dL — ABNORMAL LOW (ref 30.0–36.0)
MCV: 88.5 fL (ref 80.0–100.0)
Platelets: 474 K/uL — ABNORMAL HIGH (ref 150–400)
RBC: 3.48 MIL/uL — ABNORMAL LOW (ref 3.87–5.11)
RDW: 18 % — ABNORMAL HIGH (ref 11.5–15.5)
WBC: 12.5 K/uL — ABNORMAL HIGH (ref 4.0–10.5)
nRBC: 0 % (ref 0.0–0.2)

## 2024-08-17 LAB — BASIC METABOLIC PANEL WITH GFR
Anion gap: 20 — ABNORMAL HIGH (ref 5–15)
BUN: 14 mg/dL (ref 8–23)
CO2: 16 mmol/L — ABNORMAL LOW (ref 22–32)
Calcium: 9.1 mg/dL (ref 8.9–10.3)
Chloride: 106 mmol/L (ref 98–111)
Creatinine, Ser: 0.8 mg/dL (ref 0.44–1.00)
GFR, Estimated: >60 mL/min (ref >60)
Glucose, Bld: 200 mg/dL — ABNORMAL HIGH (ref 70–99)
Potassium: 3.8 mmol/L (ref 3.5–5.1)
Sodium: 142 mmol/L (ref 135–145)

## 2024-08-17 LAB — GLUCOSE, CAPILLARY
Glucose-Capillary: 238 mg/dL — ABNORMAL HIGH (ref 70–99)
Glucose-Capillary: 320 mg/dL — ABNORMAL HIGH (ref 70–99)

## 2024-08-17 LAB — VITAMIN B12: Vitamin B-12: 1978 pg/mL — ABNORMAL HIGH (ref 180–914)

## 2024-08-17 LAB — MRSA NEXT GEN BY PCR, NASAL: MRSA by PCR Next Gen: NOT DETECTED

## 2024-08-17 LAB — FERRITIN: Ferritin: 198 ng/mL (ref 11–307)

## 2024-08-17 LAB — FOLATE: Folate: 15.9 ng/mL (ref >5.9)

## 2024-08-17 MED ORDER — SODIUM BICARBONATE 8.4 % IV SOLN
INTRAVENOUS | Status: AC
  Filled 2024-08-17: qty 50

## 2024-08-17 MED ORDER — UMECLIDINIUM-VILANTEROL 62.5-25 MCG/ACT IN AEPB
1.0000 | INHALATION_SPRAY | Freq: Every day | RESPIRATORY_TRACT | Status: DC
  Administered 2024-08-18 – 2024-08-21 (×4): 1 via RESPIRATORY_TRACT
  Filled 2024-08-17: qty 14

## 2024-08-17 MED ORDER — SODIUM BICARBONATE 8.4 % IV SOLN
100.0000 meq | Freq: Once | INTRAVENOUS | Status: AC
  Administered 2024-08-17: 100 meq via INTRAVENOUS
  Filled 2024-08-17: qty 50

## 2024-08-17 MED ORDER — PANTOPRAZOLE SODIUM 40 MG PO TBEC
40.0000 mg | DELAYED_RELEASE_TABLET | Freq: Every day | ORAL | Status: DC
  Administered 2024-08-17 – 2024-08-25 (×9): 40 mg via ORAL
  Filled 2024-08-17 (×9): qty 1

## 2024-08-17 MED ORDER — ORAL CARE MOUTH RINSE: 15.0000 mL | OROMUCOSAL | Status: DC | PRN

## 2024-08-17 MED ORDER — SODIUM BICARBONATE 8.4 % IV SOLN
100.0000 meq | INTRAVENOUS | Status: AC
  Administered 2024-08-17 (×2): 100 meq via INTRAVENOUS
  Filled 2024-08-17 (×2): qty 50
  Filled 2024-08-17: qty 100

## 2024-08-17 MED ORDER — HYDROXYZINE HCL 25 MG PO TABS
25.0000 mg | ORAL_TABLET | Freq: Three times a day (TID) | ORAL | Status: DC
  Administered 2024-08-17 – 2024-08-25 (×25): 25 mg via ORAL
  Filled 2024-08-17 (×25): qty 1

## 2024-08-17 MED ORDER — SODIUM CHLORIDE 0.9 % IV SOLN: INTRAVENOUS | Status: AC | PRN

## 2024-08-17 MED ORDER — CHLORHEXIDINE GLUCONATE CLOTH 2 % EX PADS
6.0000 | MEDICATED_PAD | Freq: Every day | CUTANEOUS | Status: DC
  Administered 2024-08-19 – 2024-08-21 (×4): 6 via TOPICAL

## 2024-08-17 MED ORDER — GUAIFENESIN ER 600 MG PO TB12
600.0000 mg | ORAL_TABLET | Freq: Two times a day (BID) | ORAL | Status: DC
  Administered 2024-08-17 – 2024-08-25 (×17): 600 mg via ORAL
  Filled 2024-08-17 (×17): qty 1

## 2024-08-17 MED ORDER — BISOPROLOL FUMARATE 5 MG PO TABS
10.0000 mg | ORAL_TABLET | Freq: Every day | ORAL | Status: DC
  Administered 2024-08-18 – 2024-08-25 (×8): 10 mg via ORAL
  Filled 2024-08-17 (×8): qty 2

## 2024-08-17 MED ORDER — DILTIAZEM HCL 60 MG PO TABS
60.0000 mg | ORAL_TABLET | Freq: Three times a day (TID) | ORAL | Status: DC
  Administered 2024-08-17 – 2024-08-19 (×6): 60 mg via ORAL
  Filled 2024-08-17: qty 1
  Filled 2024-08-17: qty 2
  Filled 2024-08-17 (×4): qty 1

## 2024-08-17 MED ORDER — MELATONIN 5 MG PO TABS
5.0000 mg | ORAL_TABLET | Freq: Every evening | ORAL | Status: AC | PRN
  Administered 2024-08-17 – 2024-08-18 (×2): 5 mg via ORAL
  Filled 2024-08-17 (×2): qty 1

## 2024-08-17 NOTE — Progress Notes (Signed)
 [facility] Progress Note  Patient: Lindsey Williamson    FMW:[id number]  DOA: 08/16/2024     Date of Service: the patient was seen and examined on 08/17/2024  No chief complaint on file.  Brief hospital course: Lindsey Williamson is a 73 y.o. female with a hx of RLL cancer, ILD, COPD, DM2, CAD, HTN, HLD, chronic respiratory failure,    Assessment and Plan:  Metabolic acidosis 8/21 Bicarb level 12 very low Bicarbonate 100 mEq every hourly x 2 doses give Repeat bicarb level 16, another dose of bicarbonate 100 mEq x 1 given Check BMP tomorrow a.m.   COPD exacerbation ILD and chronic respiratory failure on 2 L at home Continue supplemental O2 inhalation Empiric antibiotics ceftriaxone  IV Solu-Medrol  followed by tapering prednisone  DuoNeb every 6 hourly Mucinex  600 mg p.o. twice daily Anoro Ellipta  inhaler   # Sinus tachycardia versus atrial tachycardia Continue to monitor on telemetry Seen by cardiology Continue bisoprolol , started Cardizem  CD 120 mg p.o. daily Discontinued amlodipine  Follow cardiology for further recommendation  # Hypertension PTA amlodipine  discontinued  -Bisoprolol     Started Cardizem  CD 120 mg p.o. daily Monitor BP and titrate medications accordingly   NSCLC stage 3 Follow-up with oncology as an outpatient  Hemoptysis Improved with TXA nebs Most likely secondary to underlying malignancy Seen by PCCM/pulmonary Recommended to consultation with IR to see if there is a target bronchial artery that could be embolized and ablated. Other option would be discussion with thoracic surgery regarding possible APC or laser via bronchoscopy .  Standard bronchoscopy would not really offer much in the way of hemostasis, although could treat tumor with epinephrine  for some temporary effect -risk probably not worth the low benefit.  Cryotherapy by bronchoscopy would allow some tumor debridement but would probably not decrease frequency of bleeding. -Would be  reasonable to discuss therapeutic thoracentesis with IR to help with her work of breathing.  Question whether she would benefit from Pleurx catheter placement.  This would be palliative, but possibly beneficial since it looks like her cancer is progressing.  Hypomagnesemia, mag repleted Monitor electrolytes daily and replete as needed   Diabetes mellitus type Hemoglobin A1c 9.3, elevated At risk for hyperglycemia due to steroid NovoLog  sliding scale Monitor CBG, diabetic diet  CAD and HLD Continue statin Held aspirin  for now due to hemoptysis  Anxiety, started Atarax  25 mg p.o. 3 times daily  Body mass index is 25.06 kg/m.  Interventions:  Diet: Carb modified diet DVT Prophylaxis: SCDs  Advance goals of care discussion: Full code  Family Communication: family was not present at bedside, at the time of interview.  The pt provided permission to discuss medical plan with the family. Opportunity was given to ask question and all questions were answered satisfactorily.   Disposition:  Pt is from home, admitted with COPD exacerbation and hemoptysis, found to have metabolic acidosis, still has symptoms, which precludes a safe discharge. Discharge to home, when stable, may need few days to improve.  Subjective: No significant events overnight, patient still has shortness of breath and mild productive cough.  Denied any other complaints.  Physical Exam: General: NAD, lying comfortably, mild shortness of breath Appear in no distress, affect appropriate Eyes: PERRLA ENT: Oral Mucosa Clear, moist  Neck: no JVD,  Cardiovascular: S1 and S2 Present, no Murmur, tachycardia Respiratory: good respiratory effort, Bilateral Air entry equal and Decreased, no Crackles, no wheezes Abdomen: Bowel Sound present, Soft and no tenderness,  Skin: no rashes Extremities: no Pedal edema, no  calf tenderness Neurologic: without any new focal findings Gait not checked due to patient safety  concerns  Vitals:   08/17/24 1111 08/17/24 1156 08/17/24 1230 08/17/24 1300  BP: (!) 151/87  (!) 162/80 (!) 157/82  Pulse: (!) 132  (!) 109 (!) 102  Resp: (!) 33  (!) 29 (!) 31  Temp: (!) 97.4 F (36.3 C)     TempSrc: Oral     SpO2: (!) 27% 95% 95% 97%  Weight:      Height:        Intake/Output Summary (Last 24 hours) at 08/17/2024 1427 Last data filed at 08/16/2024 2328 Gross per 24 hour  Intake 1392.64 ml  Output --  Net 1392.64 ml   Filed Weights   08/16/24 1434  Weight: 66.2 kg    Data Reviewed: I have personally reviewed and interpreted daily labs, tele strips, imagings as discussed above. I reviewed all nursing notes, pharmacy notes, vitals, pertinent old records I have discussed plan of care as described above with RN and patient/family.  CBC: Recent Labs  Lab 08/13/24 0843 08/16/24 1529 08/17/24 0349  WBC 10.9* 12.4* 12.5*  NEUTROABS 8.6* 10.0*  --   HGB 10.0* 9.7* 9.0*  HCT 32.1* 33.6* 30.8*  MCV 83.4 87.0 88.5  PLT 440* 500* 474*   Basic Metabolic Panel: Recent Labs  Lab 08/13/24 0843 08/16/24 1529 08/16/24 2002 08/17/24 0349  NA 136 139  --  133*  K 3.7 3.9  --  4.1  CL 102 107  --  102  CO2 20* 17*  --  12*  GLUCOSE 224* 154*  --  231*  BUN 10 11  --  12  CREATININE 0.59 0.61  --  0.67  CALCIUM 9.1 9.6  --  8.8*  MG  --   --  1.5* 2.0  PHOS  --   --  3.0 3.5    Studies: CT Angio Chest PE W and/or Wo Contrast Result Date: 08/16/2024 CLINICAL DATA:  Shortness of breath, hemoptysis. History of lung cancer. EXAM: CT ANGIOGRAPHY CHEST WITH CONTRAST TECHNIQUE: Multidetector CT imaging of the chest was performed using the standard protocol during bolus administration of intravenous contrast. Multiplanar CT image reconstructions and MIPs were obtained to evaluate the vascular anatomy. RADIATION DOSE REDUCTION: This exam was performed according to the departmental dose-optimization program which includes automated exposure control, adjustment of the  mA and/or kV according to patient size and/or use of iterative reconstruction technique. CONTRAST:  75mL OMNIPAQUE  IOHEXOL  350 MG/ML SOLN COMPARISON:  July 16, 2024.  June 28, 2024. FINDINGS: Cardiovascular: Satisfactory opacification of the pulmonary arteries to the segmental level. No evidence of pulmonary embolism. Mild cardiomegaly. No pericardial effusion. Mediastinum/Nodes: Thyroid  gland is unremarkable. The esophagus is unremarkable. Extensive adenopathy is noted in the subcarinal and right hilar regions as noted on recent PET scan. This mass appears to extend into and occlude the right lower lobe bronchus proximally. Lungs/Pleura: No pneumothorax is noted. Emphysematous disease is noted. 3.7 cm mass noted posteriorly in left lower lobe consistent with metastatic disease. Moderate size right pleural effusion is noted with adjacent atelectasis or infiltrate of residual right upper lobe. Upper Abdomen: No acute abnormality. Musculoskeletal: No chest wall abnormality. No acute or significant osseous findings. Review of the MIP images confirms the above findings. IMPRESSION: 1. No definite evidence of pulmonary embolus. 2. Extensive subcarinal and right hilar adenopathy is noted as noted on recent PET scan. This mass appears to extend into and occlude the right lower lobe  bronchus proximally. 3. Moderate size right pleural effusion is noted with adjacent atelectasis or infiltrate of residual right upper lobe. 4. 3.7 cm mass noted posteriorly in left lower lobe consistent with metastatic disease. 5. Emphysema. Aortic Atherosclerosis (ICD10-I70.0) and Emphysema (ICD10-J43.9). Electronically Signed   By: Lynwood Landy Raddle M.D.   On: 08/16/2024 17:30    Scheduled Meds:  [START ON 08/18/2024] bisoprolol   10 mg Oral Daily   [START ON 08/18/2024] Chlorhexidine  Gluconate Cloth  6 each Topical Q0600   diltiazem   60 mg Oral Q8H   guaiFENesin   600 mg Oral BID   hydrOXYzine   25 mg Oral TID   insulin  aspart  0-9 Units  Subcutaneous Q4H   ipratropium-albuterol   3 mL Nebulization QID   methylPREDNISolone  (SOLU-MEDROL ) injection  40 mg Intravenous Q12H   Followed by   NOREEN ON 08/18/2024] predniSONE   40 mg Oral Q breakfast   pantoprazole   40 mg Oral Daily   pravastatin   80 mg Oral QHS   umeclidinium-vilanterol  1 puff Inhalation Daily   Continuous Infusions:  sodium chloride  100 mL/hr at 08/16/24 2213   sodium chloride      cefTRIAXone  (ROCEPHIN )  IV     PRN Meds: sodium chloride , acetaminophen  **OR** acetaminophen , albuterol , fentaNYL  (SUBLIMAZE ) injection, HYDROcodone -acetaminophen , levalbuterol , ondansetron  **OR** ondansetron  (ZOFRAN ) IV, mouth rinse  Time spent: 55 minutes  Author: ELVAN SOR. MD [facility] 08/17/2024 2:27 PM  To reach On-call, see care teams to locate the attending and reach out to them via [email or web address]. If 7PM-7AM, please contact night-coverage If you still have difficulty reaching the attending provider, please page the [facility] (Director on Call) for [facility] on amion for assistance.

## 2024-08-17 NOTE — Progress Notes (Signed)
 PT Cancellation Note  Patient Details Name: Lindsey Williamson MRN: [id number] DOB: 1951-12-22   Cancelled Treatment:    Reason Eval/Treat Not Completed: Medical issues which prohibited therapy. PT transitioned to ICU due to elevated HR and and RR. PT to continue to follow acutely.   [place], PT Acute Rehab   [place] VEAR Drone 08/17/2024, 1:57 PM

## 2024-08-17 NOTE — Consult Note (Signed)
 Cardiology Consultation   Patient ID: [name] MRN: [id number]; DOB: 06-12-1951  Admit date: 08/16/2024 Date of Consult: 08/17/2024  PCP:  [name], MD   [facility] Providers Cardiologist:  Maude Emmer, MD      Patient Profile: Lindsey Williamson is a 73 y.o. female with a hx of RLL cancer, ILD, COPD, DM2, HLD, HTN, chronic respiratory failure, and CAD who is being seen 08/17/2024 for the evaluation of tachycardia at the request of Dr. Von.  History of Present Illness: 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 Follow up heart cath confirmed nonobstructive CAD.    She has a history of ILD with RLL cancer and recent bronchoscopy complicated by hemoptysis. She was recently admitted May 2025 with hemoptysis. Bronchoscopy with endobronchial lesions that appeared to be high risk for bleeding.   She presented to [facility] with worsening SOB. EKG consistent with atrial tachycardia, prompting cardiology consult.   Telemetry with bouts of what appears to be SR with PACs preceding what appears to be brief bouts of atrial tachycardia with HR in the 120s.   She has been treated with 5 mg IV cardizem , tachycardia less frequent.   Home medications include amlodipine , bisoprolol , 25 mg jardiance .   On exam, she is seen sitting up eating. She appears NAD. She denies palpitations. We review her heart cath last year.    Past Medical History:  Diagnosis Date   Angioedema 10/17/2023   Arthritis    Diabetes mellitus without complication (HCC)    Hypertension     Past Surgical History:  Procedure Laterality Date   BRONCHIAL BIOPSY  03/30/2022   Procedure: BRONCHIAL BIOPSIES;  Surgeon: Brenna Adine CROME, DO;  Location: [facility] ENDOSCOPY;  Service: Pulmonary;;   BRONCHIAL BIOPSY  10/17/2023   Procedure: BRONCHIAL BIOPSIES;  Surgeon: Shelah Lamar RAMAN, MD;  Location: [facility] ENDOSCOPY;  Service: Pulmonary;;   BRONCHIAL BRUSHINGS  03/30/2022   Procedure: BRONCHIAL BRUSHINGS;   Surgeon: Brenna Adine CROME, DO;  Location: [facility] ENDOSCOPY;  Service: Pulmonary;;   BRONCHIAL BRUSHINGS  10/17/2023   Procedure: BRONCHIAL BRUSHINGS;  Surgeon: Shelah Lamar RAMAN, MD;  Location: [facility] ENDOSCOPY;  Service: Pulmonary;;   BRONCHIAL NEEDLE ASPIRATION BIOPSY  03/30/2022   Procedure: BRONCHIAL NEEDLE ASPIRATION BIOPSIES;  Surgeon: Brenna Adine CROME, DO;  Location: [facility] ENDOSCOPY;  Service: Pulmonary;;   BRONCHIAL NEEDLE ASPIRATION BIOPSY  10/17/2023   Procedure: BRONCHIAL NEEDLE ASPIRATION BIOPSIES;  Surgeon: Shelah Lamar RAMAN, MD;  Location: [facility] ENDOSCOPY;  Service: Pulmonary;;   BRONCHIAL WASHINGS  05/19/2024   Procedure: IRRIGATION, BRONCHUS;  Surgeon: Jude Harden GAILS, MD;  Location: [facility] ENDOSCOPY;  Service: Cardiopulmonary;;   EYE SURGERY     FIDUCIAL MARKER PLACEMENT  03/30/2022   Procedure: FIDUCIAL MARKER PLACEMENT;  Surgeon: Brenna Adine CROME, DO;  Location: [facility] ENDOSCOPY;  Service: Pulmonary;;   FINE NEEDLE ASPIRATION  10/17/2023   Procedure: FINE NEEDLE ASPIRATION;  Surgeon: Shelah Lamar RAMAN, MD;  Location: [facility] ENDOSCOPY;  Service: Pulmonary;;   FOREIGN BODY REMOVAL  03/30/2022   Procedure: FOREIGN BODY REMOVAL;  Surgeon: Brenna Adine CROME, DO;  Location: [facility] ENDOSCOPY;  Service: Pulmonary;;   HEMOSTASIS CONTROL  10/17/2023   Procedure: HEMOSTASIS CONTROL;  Surgeon: Shelah Lamar RAMAN, MD;  Location: [facility] ENDOSCOPY;  Service: Pulmonary;;   VIDEO BRONCHOSCOPY Bilateral 05/19/2024   Procedure: VIDEO BRONCHOSCOPY WITHOUT FLUORO;  Surgeon: Jude Harden GAILS, MD;  Location: [facility] ENDOSCOPY;  Service: Cardiopulmonary;  Laterality: Bilateral;   VIDEO BRONCHOSCOPY WITH ENDOBRONCHIAL ULTRASOUND Bilateral 03/30/2022  Procedure: VIDEO BRONCHOSCOPY WITH ENDOBRONCHIAL ULTRASOUND;  Surgeon: Brenna Adine CROME, DO;  Location: [facility] ENDOSCOPY;  Service: Pulmonary;  Laterality: Bilateral;   VIDEO BRONCHOSCOPY WITH ENDOBRONCHIAL ULTRASOUND Right 10/17/2023   Procedure: VIDEO BRONCHOSCOPY WITH ENDOBRONCHIAL ULTRASOUND;  Surgeon: Shelah Lamar RAMAN, MD;  Location: [facility] ENDOSCOPY;  Service: Pulmonary;  Laterality: Right;   VIDEO BRONCHOSCOPY WITH RADIAL ENDOBRONCHIAL ULTRASOUND  03/30/2022   Procedure: VIDEO BRONCHOSCOPY WITH RADIAL ENDOBRONCHIAL ULTRASOUND;  Surgeon: Brenna Adine CROME, DO;  Location: [facility] ENDOSCOPY;  Service: Pulmonary;;     Home Medications:  Prior to Admission medications   Medication Sig Start Date End Date Taking? Authorizing Provider  albuterol  (VENTOLIN  HFA) 108 (90 Base) MCG/ACT inhaler Inhale 2 puffs into the lungs every 6 (six) hours as needed for wheezing or shortness of breath. 08/07/24  Yes Olalere, Adewale A, MD  amLODipine  (NORVASC ) 5 MG tablet Take 5 mg by mouth in the morning. 02/08/22  Yes [provider]  aspirin  EC 81 MG tablet Take 81 mg by mouth daily. Swallow whole.   Yes [provider]  bisoprolol  (ZEBETA ) 5 MG tablet Take 2.5 mg by mouth 2 (two) times daily.   Yes [provider]  cetirizine  (ZYRTEC  ALLERGY ) 10 MG tablet Take 1 tablet (10 mg total) by mouth daily. 03/08/24  Yes Lorin Norris, MD  dexamethasone  (DECADRON ) 4 MG tablet Take 2 tablets (8mg ) by mouth daily starting the day after carboplatin  for 3 days. Take with food 07/23/24  Yes Sherrod Sherrod, MD  diclofenac Sodium (VOLTAREN) 1 % GEL Apply 1 Application topically daily as needed (pain).   Yes [provider]  empagliflozin  (JARDIANCE ) 25 MG TABS tablet Take 25 mg by mouth daily at 6 PM. 02/08/22  Yes [provider]  ferrous sulfate  325 (65 FE) MG tablet Take 325 mg by mouth daily with breakfast.   Yes [provider]  glipiZIDE  (GLUCOTROL  XL) 5 MG 24 hr tablet Take 1 tablet (5 mg total) by mouth daily with breakfast. 04/11/24  Yes Amin, Ankit C, MD  guaiFENesin -codeine  100-10 MG/5ML syrup Take 5 mLs by mouth 3 (three) times daily as needed for cough. 08/13/24  Yes Heilingoetter, Cassandra L, PA-C  insulin  aspart (NOVOLOG ) 100 UNIT/ML FlexPen Inject 0-11 Units into the skin 3  (three) times daily with meals. Check Blood Glucose (BG) and inject per scale: BG <150= 0 unit; BG 150-200= 1 unit; BG 201-250= 3 unit; BG 251-300= 5 unit; BG 301-350= 7 unit; BG 351-400= 9 unit; BG >400= 11 unit and Call Primary Care. Patient taking differently: Inject 0-11 Units into the skin as needed for high blood sugar. Check Blood Glucose (BG) and inject per scale: BG <150= 0 unit; BG 150-200= 1 unit; BG 201-250= 3 unit; BG 251-300= 5 unit; BG 301-350= 7 unit; BG 351-400= 9 unit; BG >400= 11 unit and Call Primary Care. 04/11/24  Yes Amin, Ankit C, MD  levofloxacin  (LEVAQUIN ) 500 MG tablet Take 1 tablet (500 mg total) by mouth daily. 08/13/24  Yes Heilingoetter, Cassandra L, PA-C  metFORMIN (GLUCOPHAGE-XR) 500 MG 24 hr tablet Take 1,000 mg by mouth in the morning and at bedtime. 02/08/22  Yes [provider]  pravastatin  (PRAVACHOL ) 80 MG tablet Take 80 mg by mouth at bedtime.   Yes [provider]  prochlorperazine  (COMPAZINE ) 10 MG tablet Take 1 tablet (10 mg total) by mouth every 6 (six) hours as needed. 07/17/24  Yes Heilingoetter, Cassandra L, PA-C  umeclidinium-vilanterol (ANORO ELLIPTA ) 62.5-25 MCG/ACT AEPB Inhale 1 puff into  the lungs daily. 05/09/24  Yes Ruthell Lauraine FALCON, NP  doxycycline  (VIBRA -TABS) 100 MG tablet Take 1 tablet (100 mg total) by mouth 2 (two) times daily. Patient not taking: Reported on 08/16/2024 07/30/24   Heilingoetter, Cassandra L, PA-C  ipratropium-albuterol  (DUONEB) 0.5-2.5 (3) MG/3ML SOLN Take 3 mLs by nebulization in the morning and at bedtime. 05/14/24   Ruthell Lauraine FALCON, NP  lidocaine -prilocaine  (EMLA ) cream Apply to affected area once 07/23/24   Sherrod Sherrod, MD  omeprazole  (PRILOSEC) 20 MG capsule Take 20 mg by mouth daily. Patient not taking: Reported on 08/16/2024    [provider]  ondansetron  (ZOFRAN ) 8 MG tablet Take 1 tablet (8 mg total) by mouth every 8 (eight) hours as needed for nausea or vomiting. Start on the third day after  carboplatin . 07/23/24   Sherrod Sherrod, MD  polyethylene glycol (MIRALAX  / GLYCOLAX ) 17 g packet Take 17 g packet by mouth daily. 02/19/24   Adhikari, Amrit, MD  potassium chloride  SA (KLOR-CON  M) 20 MEQ tablet Take 1 tablet (20 mEq total) by mouth daily. Patient not taking: Reported on 08/16/2024 08/06/24   Sherrod Sherrod, MD  predniSONE  (DELTASONE ) 20 MG tablet Take 40 mg by mouth daily with breakfast. Patient not taking: Reported on 08/16/2024    [provider]    Scheduled Meds:  bisoprolol   2.5 mg Oral Daily   guaiFENesin   600 mg Oral BID   insulin  aspart  0-9 Units Subcutaneous Q4H   ipratropium-albuterol   3 mL Nebulization QID   methylPREDNISolone  (SOLU-MEDROL ) injection  40 mg Intravenous Q12H   Followed by   NOREEN ON 08/18/2024] predniSONE   40 mg Oral Q breakfast   pantoprazole   40 mg Oral Daily   pravastatin   80 mg Oral QHS   sodium bicarbonate   100 mEq Intravenous Q1 Hr x 2   umeclidinium-vilanterol  1 puff Inhalation Daily   Continuous Infusions:  sodium chloride  100 mL/hr at 08/16/24 2213   cefTRIAXone  (ROCEPHIN )  IV     PRN Meds: acetaminophen  **OR** acetaminophen , albuterol , fentaNYL  (SUBLIMAZE ) injection, HYDROcodone -acetaminophen , levalbuterol , ondansetron  **OR** ondansetron  (ZOFRAN ) IV  Allergies:    Allergies  Allergen Reactions   Benzonatate Palpitations   Lisinopril Swelling and Other (See Comments)    Patient ended up in the ED with a swollen mouth and tongue   Aspirin  Nausea And Vomiting and Palpitations    Can tolerate baby aspirin  81 mg without difficulty but can't tolerate higher dosages   Azithromycin  Palpitations   Codeine  Palpitations    Patient states that this is not an allergy     Social History:   Social History   Socioeconomic History   Marital status: Married    Spouse name: Not on file   Number of children: Not on file   Years of education: Not on file   Highest education level: Not on file  Occupational History   Not on  file  Tobacco Use   Smoking status: Former    Current packs/day: 0.00    Types: Cigarettes    Quit date: 2024    Years since quitting: 1.6    Passive exposure: Past   Smokeless tobacco: Never  Vaping Use   Vaping status: Never Used  Substance and Sexual Activity   Alcohol use: No   Drug use: Never   Sexual activity: Not Currently  Other Topics Concern   Not on file  Social History Narrative   Not on file   Social Drivers of Health   Financial Resource Strain: Low Risk  (  04/24/2024)   Received from [facility]   Overall Financial Resource Strain (CARDIA)    Difficulty of Paying Living Expenses: Not very hard  Food Insecurity: No Food Insecurity (05/18/2024)   Hunger Vital Sign    Worried About Running Out of Food in the Last Year: Never true    Ran Out of Food in the Last Year: Never true  Transportation Needs: No Transportation Needs (05/18/2024)   PRAPARE - Administrator, Civil Service (Medical): No    Lack of Transportation (Non-Medical): No  Physical Activity: Inactive (04/24/2024)   Received from [facility]   Exercise Vital Sign    On average, how many days per week do you engage in moderate to strenuous exercise (like a brisk walk)?: 0 days    On average, how many minutes do you engage in exercise at this level?: 20 min  Stress: Stress Concern Present (04/24/2024)   Received from [facility] - Occupational Stress Questionnaire    Feeling of Stress : Rather much  Social Connections: Socially Integrated (05/18/2024)   Social Connection and Isolation Panel    Frequency of Communication with Friends and Family: More than three times a week    Frequency of Social Gatherings with Friends and Family: Once a week    Attends Religious Services: 1 to 4 times per year    Active Member of [employer] or Organizations: No    Attends Engineer, structural: 1 to 4 times per year    Marital Status: Married  Careers information officer Violence: Not At Risk (05/18/2024)   Humiliation, Afraid, Rape, and Kick questionnaire    Fear of Current or Ex-Partner: No    Emotionally Abused: No    Physically Abused: No    Sexually Abused: No    Family History:    Family History  Problem Relation Age of Onset   Allergic rhinitis Sister    Asthma Sister    Angioedema Neg Hx    Atopy Neg Hx    Immunodeficiency Neg Hx    Urticaria Neg Hx    Eczema Neg Hx      ROS:  Please see the history of present illness.   All other ROS reviewed and negative.     Physical Exam/Data: Vitals:   08/17/24 0230 08/17/24 0300 08/17/24 0530 08/17/24 0717  BP:  (!) 153/81 (!) 160/81 (!) 156/87  Pulse:  88 89 90  Resp:  (!) 24 (!) 22 (!) 29  Temp: 98.6 F (37 C) 98.5 F (36.9 C)  97.7 F (36.5 C)  TempSrc:    Oral  SpO2:  96% 97% 98%  Weight:      Height:        Intake/Output Summary (Last 24 hours) at 08/17/2024 0947 Last data filed at 08/16/2024 2328 Gross per 24 hour  Intake 1392.64 ml  Output --  Net 1392.64 ml      08/16/2024    2:34 PM 07/30/2024    8:58 AM 07/17/2024    2:35 PM  Last 3 Weights  Weight (lbs) 146 lb 150 lb 9.6 oz 149 lb  Weight (kg) 66.225 kg 68.312 kg 67.586 kg     Body mass index is 25.06 kg/m.  General:  Well nourished, well developed, in no acute distress HEENT: normal Neck: no JVD Vascular: No carotid bruits; Distal pulses 2+ bilaterally Cardiac:  regular rhythm, tachycardic rate Lungs:  rhonchi throughout, diminished on right base  Abd: soft,  nontender, no hepatomegaly  Ext: no edema Musculoskeletal:  No deformities, BUE and BLE strength normal and equal Skin: warm and dry  Neuro:  CNs 2-12 intact, no focal abnormalities noted Psych:  Normal affect   EKG:  The EKG was personally reviewed and demonstrates:  sinus tachycardia with HR 138, PVC Telemetry:  Telemetry was personally reviewed and demonstrates:  SR with bouts of atrial tachycardia vs ST in the 120s, appears to start with a  PAC  Relevant CV Studies:  Echo 01/2024:  1. Left ventricular ejection fraction, by estimation, is 65 to 70%. The  left ventricle has normal function. The left ventricle has no regional  wall motion abnormalities. There is mild left ventricular hypertrophy.  Left ventricular diastolic parameters  are indeterminate.   2. Right ventricular systolic function is normal. The right ventricular  size is normal. There is mildly elevated pulmonary artery systolic  pressure. The estimated right ventricular systolic pressure is 38.5 mmHg.   3. The mitral valve is degenerative. No evidence of mitral valve  regurgitation. No evidence of mitral stenosis.   4. The aortic valve is tricuspid. There is mild calcification of the  aortic valve. Aortic valve regurgitation is not visualized. Aortic valve  sclerosis is present, with no evidence of aortic valve stenosis.   5. The inferior vena cava is normal in size with greater than 50%  respiratory variability, suggesting right atrial pressure of 3 mmHg.   LHC 04/2023: Prox RCA lesion is 15% stenosed.    Mid RCA lesion is 15% stenosed.    Dist RCA lesion is 15% stenosed.    Prox LAD lesion is 20% stenosed.    1st Diag lesion is 15% stenosed.    Ost Cx to Prox Cx lesion is 20% stenosed.    Mid Cx to Dist Cx lesion is 20% stenosed.    The ejection fraction is greater than 55% by visual estimate.    The left ventricular systolic function is normal.    Laboratory Data: High Sensitivity Troponin:  No results for input(s): TROPONINIHS in the last 720 hours.   Chemistry Recent Labs  Lab 08/13/24 0843 08/16/24 1529 08/16/24 2002 08/17/24 0349  NA 136 139  --  133*  K 3.7 3.9  --  4.1  CL 102 107  --  102  CO2 20* 17*  --  12*  GLUCOSE 224* 154*  --  231*  BUN 10 11  --  12  CREATININE 0.59 0.61  --  0.67  CALCIUM 9.1 9.6  --  8.8*  MG  --   --  1.5* 2.0  GFRNONAA >60 >60  --  >60  ANIONGAP 14 15  --  19*    Recent Labs  Lab 08/13/24 0843  08/16/24 1529 08/17/24 0349  PROT 7.4 8.2* 7.5  ALBUMIN 3.5 2.8* 2.6*  AST 11* 15 11*  ALT <5 <5 5  ALKPHOS 87 82 75  BILITOT 0.3 0.9 1.2   Lipids No results for input(s): CHOL, TRIG, HDL, LABVLDL, LDLCALC, CHOLHDL in the last 168 hours.  Hematology Recent Labs  Lab 08/13/24 0843 08/16/24 1529 08/17/24 0349 08/17/24 0421  WBC 10.9* 12.4* 12.5*  --   RBC 3.85* 3.86* 3.48* 3.55*  HGB 10.0* 9.7* 9.0*  --   HCT 32.1* 33.6* 30.8*  --   MCV 83.4 87.0 88.5  --   MCH 26.0 25.1* 25.9*  --   MCHC 31.2 28.9* 29.2*  --   RDW 17.4* 17.8* 18.0*  --  PLT 440* 500* 474*  --    Thyroid  No results for input(s): TSH, FREET4 in the last 168 hours.  BNPNo results for input(s): BNP, PROBNP in the last 168 hours.  DDimer No results for input(s): DDIMER in the last 168 hours.  Radiology/Studies:  CT Angio Chest PE W and/or Wo Contrast Result Date: 08/16/2024 CLINICAL DATA:  Shortness of breath, hemoptysis. History of lung cancer. EXAM: CT ANGIOGRAPHY CHEST WITH CONTRAST TECHNIQUE: Multidetector CT imaging of the chest was performed using the standard protocol during bolus administration of intravenous contrast. Multiplanar CT image reconstructions and MIPs were obtained to evaluate the vascular anatomy. RADIATION DOSE REDUCTION: This exam was performed according to the departmental dose-optimization program which includes automated exposure control, adjustment of the mA and/or kV according to patient size and/or use of iterative reconstruction technique. CONTRAST:  75mL OMNIPAQUE  IOHEXOL  350 MG/ML SOLN COMPARISON:  July 16, 2024.  June 28, 2024. FINDINGS: Cardiovascular: Satisfactory opacification of the pulmonary arteries to the segmental level. No evidence of pulmonary embolism. Mild cardiomegaly. No pericardial effusion. Mediastinum/Nodes: Thyroid  gland is unremarkable. The esophagus is unremarkable. Extensive adenopathy is noted in the subcarinal and right hilar regions as noted  on recent PET scan. This mass appears to extend into and occlude the right lower lobe bronchus proximally. Lungs/Pleura: No pneumothorax is noted. Emphysematous disease is noted. 3.7 cm mass noted posteriorly in left lower lobe consistent with metastatic disease. Moderate size right pleural effusion is noted with adjacent atelectasis or infiltrate of residual right upper lobe. Upper Abdomen: No acute abnormality. Musculoskeletal: No chest wall abnormality. No acute or significant osseous findings. Review of the MIP images confirms the above findings. IMPRESSION: 1. No definite evidence of pulmonary embolus. 2. Extensive subcarinal and right hilar adenopathy is noted as noted on recent PET scan. This mass appears to extend into and occlude the right lower lobe bronchus proximally. 3. Moderate size right pleural effusion is noted with adjacent atelectasis or infiltrate of residual right upper lobe. 4. 3.7 cm mass noted posteriorly in left lower lobe consistent with metastatic disease. 5. Emphysema. Aortic Atherosclerosis (ICD10-I70.0) and Emphysema (ICD10-J43.9). Electronically Signed   By: Lynwood Landy Raddle M.D.   On: 08/16/2024 17:30     Assessment and Plan:  Sinus tachycardia vs Atrial tachycardia - EKG with HR 138, PV-C - telemetry with SR-ST 80-120s, some bouts of tachycardia begins with PAC, suspect atrial tachycardia - suspect related to lung cancer, dyspnea, anemia, and albuterol   - will stop bisoprolol  given lung cancer, COPD, and chronic respiratory failure - will hold amlodipine  and start 120 mg cardizem    Hypertension - PTA amlodipine  and bisoprolol  - holding amlodipine , bisoprolol  reduced to 2.5 mg - given tachycardia, and ongoing dypsnea, consider switching BB to PO cardizem  - stop bisoprolol  and start 120 mg cardizem  - will also hold amlodipine    NSCLC stage 3 COPD ILD Chronic respiratory failure on home O2 - per primary, stopping BB as above   Risk Assessment/Risk Scores:        For questions or updates, please contact [facility] Please consult [email or web address] for contact info under    Signed, Jon Nat Hails, PA  08/17/2024 9:47 AM

## 2024-08-17 NOTE — Progress Notes (Signed)
 OT Cancellation Note  Patient Details Name: Lindsey Williamson MRN: [id number] DOB: 09/11/1951   Cancelled Treatment:    Reason Eval/Treat Not Completed: Medical issues which prohibited therapy. Orders received, chart reviewed. Pt recently transferred to higher level of care with elevated respiration rate and HR. OT will continue to follow and check back when appropriate.   Jossie Smoot L. Quinlan Vollmer, OTR/L  08/17/24, 1:47 PM

## 2024-08-17 NOTE — Consult Note (Signed)
 NAME:  JESSIECA RHEM, MRN:  [id number], DOB:  03-14-1951, LOS: 1 ADMISSION DATE:  08/16/2024, CONSULTATION DATE: 08/17/2024 REFERRING MD: Dr. 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  Followed in our office by Dr Neda, chronic hypoxemic respiratory failure on continuous O2.  Repeat bronchoscopy 05/19/2024 for hemoptysis showed an endobronchial lesion in the bronchus intermedius extending to the right lower lobe with associated bleeding.  Followed conservatively. She underwent chemoradiation with right upper lobe XRT, and then Imfinzi  complicated by associated pneumonitis June 2025 treated with steroids.  Most recent imaging included PET scan 07/16/2024 that showed progressive disease in the right lower lobe with associated complete atelectasis, contralateral left lower lobe metastasis (versus primary), progressive hilar and mediastinal adenopathy.  Current therapy is carboplatin /Paclitaxol + Keytruda .  Has continued to have cough, tried Hydromet with only partial success.  Her treatment was delayed and she was treated for possible bronchitis with levofloxacin  08/13/2024. Admitted now with progressive dyspnea, increased hemoptysis since the May hospitalization and bronchoscopy.  In the emergency department she received TXA nebs. She describes bloody mucous, at it's heaviest about 1/2 a cup on day prior to presentation. Usually bright red, sometimes dark spotty clots. Last time she saw any was this am - scant dark blood (after the TXA).     Pertinent  Medical History   Past Medical History:  Diagnosis Date   Angioedema 10/17/2023   Arthritis    Diabetes mellitus without complication (HCC)    Hypertension   CAD Right stage IIIa squamous cell lung  cancer  Significant Hospital Events: Including procedures, antibiotic start and stop dates in addition to other pertinent events   8/21 CT-PA >> no pulmonary embolism, extensive subcarinal and right hilar adenopathy that appears to extend and occlude the right lower lobe bronchus proximally.  3.7 cm left lower lobe mass.  Moderate right pleural effusion with associated right upper lobe atelectasis  Interim History / Subjective:     Objective    Blood pressure (!) 156/87, pulse 90, temperature 97.7 F (36.5 C), temperature source Oral, resp. rate (!) 29, height 5' 4 (1.626 m), weight 66.2 kg, SpO2 98%.        Intake/Output Summary (Last 24 hours) at 08/17/2024 0917 Last data filed at 08/16/2024 2328 Gross per 24 hour  Intake 1392.64 ml  Output --  Net 1392.64 ml   Filed Weights   08/16/24 1434  Weight: 66.2 kg    Examination: General: Comfortable laying in bed, oxygen in place HENT: Strong voice, no secretions, no stridor Lungs: Rhonchi on the right, mostly clear on the left.  A few scattered crackles Cardiovascular: Regular, no murmur Abdomen: Soft, nondistended with positive bowel sounds Extremities: No edema Neuro: Awake, alert, appropriate, moves all extremities, follows commands GU: Deferred  Resolved problem list   Assessment and Plan   Metastatic squamous cell lung cancer with right lower lobe endobronchial disease, progressive on most recent imaging Cough with hemoptysis associated with above, worse over several months Right pleural effusion  Recommendations: -Hemoptysis has eased off with the TXA nebs.  Almost certainly due to her known endobronchial lesion, known progression of malignancy.  Hopefully the treatment that she is beginning with oncology will be beneficial here. Won't be immediate. - If bleeding increases then would need to consider other interventions.  Probably  most straightforward would be consultation with IR to see if there is a target  bronchial artery that could be embolized and ablated.  Other option would be discussion with thoracic surgery regarding possible APC or laser via bronchoscopy, although I believe they do this infrequently.  Standard bronchoscopy would not really offer much in the way of hemostasis, although could treat tumor with epinephrine  for some temporary effect -risk probably not worth the low benefit.  Cryotherapy by bronchoscopy would allow some tumor debridement but would probably not decrease frequency of bleeding. -Would be reasonable to discuss therapeutic thoracentesis with IR to help with her work of breathing.  Question whether she would benefit from Pleurx catheter placement.  This would be palliative, but possibly beneficial since it looks like her cancer is progressing.    Labs   CBC: Recent Labs  Lab 08/13/24 0843 08/16/24 1529 08/17/24 0349  WBC 10.9* 12.4* 12.5*  NEUTROABS 8.6* 10.0*  --   HGB 10.0* 9.7* 9.0*  HCT 32.1* 33.6* 30.8*  MCV 83.4 87.0 88.5  PLT 440* 500* 474*    Basic Metabolic Panel: Recent Labs  Lab 08/13/24 0843 08/16/24 1529 08/16/24 2002 08/17/24 0349  NA 136 139  --  133*  K 3.7 3.9  --  4.1  CL 102 107  --  102  CO2 20* 17*  --  12*  GLUCOSE 224* 154*  --  231*  BUN 10 11  --  12  CREATININE 0.59 0.61  --  0.67  CALCIUM 9.1 9.6  --  8.8*  MG  --   --  1.5* 2.0  PHOS  --   --  3.0 3.5   GFR: Estimated Creatinine Clearance: 57.8 mL/min (by C-G formula based on SCr of 0.67 mg/dL). Recent Labs  Lab 08/13/24 0843 08/16/24 1529 08/16/24 2002 08/17/24 0349  WBC 10.9* 12.4*  --  12.5*  LATICACIDVEN  --   --  1.1  --     Liver Function Tests: Recent Labs  Lab 08/13/24 0843 08/16/24 1529 08/17/24 0349  AST 11* 15 11*  ALT <5 <5 5  ALKPHOS 87 82 75  BILITOT 0.3 0.9 1.2  PROT 7.4 8.2* 7.5  ALBUMIN 3.5 2.8* 2.6*   No results for input(s): LIPASE, AMYLASE in the last 168 hours. No results for input(s): AMMONIA in the last 168  hours.  ABG    Component Value Date/Time   HCO3 19.8 (L) 08/16/2024 1612   ACIDBASEDEF 4.0 (H) 08/16/2024 1612   O2SAT 36.5 08/16/2024 1612     Coagulation Profile: Recent Labs  Lab 08/16/24 1612  INR 1.0    Cardiac Enzymes: Recent Labs  Lab 08/16/24 2002  CKTOTAL 27*    HbA1C: Hgb A1c MFr Bld  Date/Time Value Ref Range Status  08/17/2024 04:21 AM 9.3 (H) 4.8 - 5.6 % Final    Comment:    (NOTE) Diagnosis of Diabetes The following HbA1c ranges recommended by the American Diabetes Association (ADA) may be used as an aid in the diagnosis of diabetes mellitus.  Hemoglobin             Suggested A1C NGSP%              Diagnosis  <5.7                   Non Diabetic  5.7-6.4                Pre-Diabetic  >6.4  Diabetic  <7.0                   Glycemic control for                       adults with diabetes.    02/16/2024 05:59 AM 7.4 (H) 4.8 - 5.6 % Final    Comment:    (NOTE) Pre diabetes:          5.7%-6.4%  Diabetes:              >6.4%  Glycemic control for   <7.0% adults with diabetes     CBG: Recent Labs  Lab 08/16/24 2122 08/16/24 2328 08/17/24 0343  GLUCAP 236* 185* 204*    Review of Systems:   As per HPI  Past Medical History:  She,  has a past medical history of Angioedema (10/17/2023), Arthritis, Diabetes mellitus without complication (HCC), and Hypertension.   Surgical History:   Past Surgical History:  Procedure Laterality Date   BRONCHIAL BIOPSY  03/30/2022   Procedure: BRONCHIAL BIOPSIES;  Surgeon: Brenna Adine CROME, DO;  Location: [facility] ENDOSCOPY;  Service: Pulmonary;;   BRONCHIAL BIOPSY  10/17/2023   Procedure: BRONCHIAL BIOPSIES;  Surgeon: Shelah Lamar RAMAN, MD;  Location: [facility] ENDOSCOPY;  Service: Pulmonary;;   BRONCHIAL BRUSHINGS  03/30/2022   Procedure: BRONCHIAL BRUSHINGS;  Surgeon: Brenna Adine CROME, DO;  Location: [facility] ENDOSCOPY;  Service: Pulmonary;;   BRONCHIAL BRUSHINGS  10/17/2023   Procedure: BRONCHIAL  BRUSHINGS;  Surgeon: Shelah Lamar RAMAN, MD;  Location: [facility] ENDOSCOPY;  Service: Pulmonary;;   BRONCHIAL NEEDLE ASPIRATION BIOPSY  03/30/2022   Procedure: BRONCHIAL NEEDLE ASPIRATION BIOPSIES;  Surgeon: Brenna Adine CROME, DO;  Location: [facility] ENDOSCOPY;  Service: Pulmonary;;   BRONCHIAL NEEDLE ASPIRATION BIOPSY  10/17/2023   Procedure: BRONCHIAL NEEDLE ASPIRATION BIOPSIES;  Surgeon: Shelah Lamar RAMAN, MD;  Location: [facility] ENDOSCOPY;  Service: Pulmonary;;   BRONCHIAL WASHINGS  05/19/2024   Procedure: IRRIGATION, BRONCHUS;  Surgeon: Jude Harden GAILS, MD;  Location: [facility] ENDOSCOPY;  Service: Cardiopulmonary;;   EYE SURGERY     FIDUCIAL MARKER PLACEMENT  03/30/2022   Procedure: FIDUCIAL MARKER PLACEMENT;  Surgeon: Brenna Adine CROME, DO;  Location: [facility] ENDOSCOPY;  Service: Pulmonary;;   FINE NEEDLE ASPIRATION  10/17/2023   Procedure: FINE NEEDLE ASPIRATION;  Surgeon: Shelah Lamar RAMAN, MD;  Location: [facility] ENDOSCOPY;  Service: Pulmonary;;   FOREIGN BODY REMOVAL  03/30/2022   Procedure: FOREIGN BODY REMOVAL;  Surgeon: Brenna Adine CROME, DO;  Location: [facility] ENDOSCOPY;  Service: Pulmonary;;   HEMOSTASIS CONTROL  10/17/2023   Procedure: HEMOSTASIS CONTROL;  Surgeon: Shelah Lamar RAMAN, MD;  Location: [facility] ENDOSCOPY;  Service: Pulmonary;;   VIDEO BRONCHOSCOPY Bilateral 05/19/2024   Procedure: VIDEO BRONCHOSCOPY WITHOUT FLUORO;  Surgeon: Jude Harden GAILS, MD;  Location: [facility] ENDOSCOPY;  Service: Cardiopulmonary;  Laterality: Bilateral;   VIDEO BRONCHOSCOPY WITH ENDOBRONCHIAL ULTRASOUND Bilateral 03/30/2022   Procedure: VIDEO BRONCHOSCOPY WITH ENDOBRONCHIAL ULTRASOUND;  Surgeon: Brenna Adine CROME, DO;  Location: [facility] ENDOSCOPY;  Service: Pulmonary;  Laterality: Bilateral;   VIDEO BRONCHOSCOPY WITH ENDOBRONCHIAL ULTRASOUND Right 10/17/2023   Procedure: VIDEO BRONCHOSCOPY WITH ENDOBRONCHIAL ULTRASOUND;  Surgeon: Shelah Lamar RAMAN, MD;  Location: [facility] ENDOSCOPY;  Service: Pulmonary;  Laterality: Right;   VIDEO BRONCHOSCOPY WITH RADIAL ENDOBRONCHIAL ULTRASOUND   03/30/2022   Procedure: VIDEO BRONCHOSCOPY WITH RADIAL ENDOBRONCHIAL ULTRASOUND;  Surgeon: Brenna Adine CROME, DO;  Location: [facility] ENDOSCOPY;  Service: Pulmonary;;     Social History:   reports that she quit  smoking about 19 months ago. Her smoking use included cigarettes. She has been exposed to tobacco smoke. She has never used smokeless tobacco. She reports that she does not drink alcohol and does not use drugs.   Family History:  Her family history includes Allergic rhinitis in her sister; Asthma in her sister. There is no history of Angioedema, Atopy, Immunodeficiency, Urticaria, or Eczema.   Allergies Allergies  Allergen Reactions   Benzonatate Palpitations   Lisinopril Swelling and Other (See Comments)    Patient ended up in the ED with a swollen mouth and tongue   Aspirin  Nausea And Vomiting and Palpitations    Can tolerate baby aspirin  81 mg without difficulty but can't tolerate higher dosages   Azithromycin  Palpitations   Codeine  Palpitations    Patient states that this is not an allergy      Home Medications  Prior to Admission medications   Medication Sig Start Date End Date Taking? Authorizing Provider  albuterol  (VENTOLIN  HFA) 108 (90 Base) MCG/ACT inhaler Inhale 2 puffs into the lungs every 6 (six) hours as needed for wheezing or shortness of breath. 08/07/24  Yes Olalere, Adewale A, MD  amLODipine  (NORVASC ) 5 MG tablet Take 5 mg by mouth in the morning. 02/08/22  Yes [provider]  aspirin  EC 81 MG tablet Take 81 mg by mouth daily. Swallow whole.   Yes [provider]  bisoprolol  (ZEBETA ) 5 MG tablet Take 2.5 mg by mouth 2 (two) times daily.   Yes [provider]  cetirizine  (ZYRTEC  ALLERGY ) 10 MG tablet Take 1 tablet (10 mg total) by mouth daily. 03/08/24  Yes Lorin Norris, MD  dexamethasone  (DECADRON ) 4 MG tablet Take 2 tablets (8mg ) by mouth daily starting the day after carboplatin  for 3 days. Take with food 07/23/24  Yes Sherrod Sherrod, MD   diclofenac Sodium (VOLTAREN) 1 % GEL Apply 1 Application topically daily as needed (pain).   Yes [provider]  empagliflozin  (JARDIANCE ) 25 MG TABS tablet Take 25 mg by mouth daily at 6 PM. 02/08/22  Yes [provider]  ferrous sulfate  325 (65 FE) MG tablet Take 325 mg by mouth daily with breakfast.   Yes [provider]  glipiZIDE  (GLUCOTROL  XL) 5 MG 24 hr tablet Take 1 tablet (5 mg total) by mouth daily with breakfast. 04/11/24  Yes Amin, Ankit C, MD  guaiFENesin -codeine  100-10 MG/5ML syrup Take 5 mLs by mouth 3 (three) times daily as needed for cough. 08/13/24  Yes Heilingoetter, Cassandra L, PA-C  insulin  aspart (NOVOLOG ) 100 UNIT/ML FlexPen Inject 0-11 Units into the skin 3 (three) times daily with meals. Check Blood Glucose (BG) and inject per scale: BG <150= 0 unit; BG 150-200= 1 unit; BG 201-250= 3 unit; BG 251-300= 5 unit; BG 301-350= 7 unit; BG 351-400= 9 unit; BG >400= 11 unit and Call Primary Care. Patient taking differently: Inject 0-11 Units into the skin as needed for high blood sugar. Check Blood Glucose (BG) and inject per scale: BG <150= 0 unit; BG 150-200= 1 unit; BG 201-250= 3 unit; BG 251-300= 5 unit; BG 301-350= 7 unit; BG 351-400= 9 unit; BG >400= 11 unit and Call Primary Care. 04/11/24  Yes Amin, Ankit C, MD  levofloxacin  (LEVAQUIN ) 500 MG tablet Take 1 tablet (500 mg total) by mouth daily. 08/13/24  Yes Heilingoetter, Cassandra L, PA-C  metFORMIN (GLUCOPHAGE-XR) 500 MG 24 hr tablet Take 1,000 mg by mouth in the morning and at bedtime. 02/08/22  Yes [provider]  pravastatin  (PRAVACHOL )  80 MG tablet Take 80 mg by mouth at bedtime.   Yes [provider]  prochlorperazine  (COMPAZINE ) 10 MG tablet Take 1 tablet (10 mg total) by mouth every 6 (six) hours as needed. 07/17/24  Yes Heilingoetter, Cassandra L, PA-C  umeclidinium-vilanterol (ANORO ELLIPTA ) 62.5-25 MCG/ACT AEPB Inhale 1 puff into the lungs daily. 05/09/24  Yes Ruthell Lauraine FALCON, NP   doxycycline  (VIBRA -TABS) 100 MG tablet Take 1 tablet (100 mg total) by mouth 2 (two) times daily. Patient not taking: Reported on 08/16/2024 07/30/24   Heilingoetter, Cassandra L, PA-C  ipratropium-albuterol  (DUONEB) 0.5-2.5 (3) MG/3ML SOLN Take 3 mLs by nebulization in the morning and at bedtime. 05/14/24   Ruthell Lauraine FALCON, NP  lidocaine -prilocaine  (EMLA ) cream Apply to affected area once 07/23/24   Sherrod Sherrod, MD  omeprazole  (PRILOSEC) 20 MG capsule Take 20 mg by mouth daily. Patient not taking: Reported on 08/16/2024    [provider]  ondansetron  (ZOFRAN ) 8 MG tablet Take 1 tablet (8 mg total) by mouth every 8 (eight) hours as needed for nausea or vomiting. Start on the third day after carboplatin . 07/23/24   Sherrod Sherrod, MD  polyethylene glycol (MIRALAX  / GLYCOLAX ) 17 g packet Take 17 g packet by mouth daily. 02/19/24   Adhikari, Amrit, MD  potassium chloride  SA (KLOR-CON  M) 20 MEQ tablet Take 1 tablet (20 mEq total) by mouth daily. Patient not taking: Reported on 08/16/2024 08/06/24   Sherrod Sherrod, MD  predniSONE  (DELTASONE ) 20 MG tablet Take 40 mg by mouth daily with breakfast. Patient not taking: Reported on 08/16/2024    [provider]     Critical care time: NA     Lamar Chris, MD, PhD 08/17/2024, 9:17 AM [facility] [phone] or if no answer before 7:00PM call [phone] For any issues after 7:00PM please call eLink [phone]

## 2024-08-18 ENCOUNTER — Inpatient Hospital Stay (HOSPITAL_COMMUNITY)

## 2024-08-18 DIAGNOSIS — J9610 Chronic respiratory failure, unspecified whether with hypoxia or hypercapnia: Secondary | ICD-10-CM | POA: Diagnosis not present

## 2024-08-18 DIAGNOSIS — R000 Tachycardia, unspecified: Secondary | ICD-10-CM | POA: Diagnosis not present

## 2024-08-18 DIAGNOSIS — R042 Hemoptysis: Secondary | ICD-10-CM | POA: Diagnosis not present

## 2024-08-18 DIAGNOSIS — I4719 Other supraventricular tachycardia: Secondary | ICD-10-CM | POA: Diagnosis not present

## 2024-08-18 DIAGNOSIS — R0600 Dyspnea, unspecified: Secondary | ICD-10-CM | POA: Diagnosis not present

## 2024-08-18 DIAGNOSIS — J90 Pleural effusion, not elsewhere classified: Secondary | ICD-10-CM | POA: Diagnosis not present

## 2024-08-18 DIAGNOSIS — C3431 Malignant neoplasm of lower lobe, right bronchus or lung: Secondary | ICD-10-CM | POA: Diagnosis not present

## 2024-08-18 DIAGNOSIS — E782 Mixed hyperlipidemia: Secondary | ICD-10-CM | POA: Diagnosis not present

## 2024-08-18 DIAGNOSIS — J441 Chronic obstructive pulmonary disease with (acute) exacerbation: Secondary | ICD-10-CM | POA: Diagnosis not present

## 2024-08-18 LAB — CBC
HCT: 31.1 % — ABNORMAL LOW (ref 36.0–46.0)
Hemoglobin: 8.9 g/dL — ABNORMAL LOW (ref 12.0–15.0)
MCH: 25.1 pg — ABNORMAL LOW (ref 26.0–34.0)
MCHC: 28.6 g/dL — ABNORMAL LOW (ref 30.0–36.0)
MCV: 87.9 fL (ref 80.0–100.0)
Platelets: 495 K/uL — ABNORMAL HIGH (ref 150–400)
RBC: 3.54 MIL/uL — ABNORMAL LOW (ref 3.87–5.11)
RDW: 18 % — ABNORMAL HIGH (ref 11.5–15.5)
WBC: 12.9 K/uL — ABNORMAL HIGH (ref 4.0–10.5)
nRBC: 0.2 % (ref 0.0–0.2)

## 2024-08-18 LAB — GLUCOSE, CAPILLARY
Glucose-Capillary: 196 mg/dL — ABNORMAL HIGH (ref 70–99)
Glucose-Capillary: 224 mg/dL — ABNORMAL HIGH (ref 70–99)
Glucose-Capillary: 242 mg/dL — ABNORMAL HIGH (ref 70–99)
Glucose-Capillary: 267 mg/dL — ABNORMAL HIGH (ref 70–99)
Glucose-Capillary: 281 mg/dL — ABNORMAL HIGH (ref 70–99)
Glucose-Capillary: 302 mg/dL — ABNORMAL HIGH (ref 70–99)
Glucose-Capillary: 345 mg/dL — ABNORMAL HIGH (ref 70–99)

## 2024-08-18 LAB — GLUCOSE, PLEURAL OR PERITONEAL FLUID: Glucose, Fluid: 283 mg/dL

## 2024-08-18 LAB — BASIC METABOLIC PANEL WITH GFR
Anion gap: 12 (ref 5–15)
BUN: 16 mg/dL (ref 8–23)
CO2: 23 mmol/L (ref 22–32)
Calcium: 8.6 mg/dL — ABNORMAL LOW (ref 8.9–10.3)
Chloride: 105 mmol/L (ref 98–111)
Creatinine, Ser: 0.78 mg/dL (ref 0.44–1.00)
GFR, Estimated: >60 mL/min (ref >60)
Glucose, Bld: 245 mg/dL — ABNORMAL HIGH (ref 70–99)
Potassium: 3.6 mmol/L (ref 3.5–5.1)
Sodium: 140 mmol/L (ref 135–145)

## 2024-08-18 LAB — MAGNESIUM: Magnesium: 1.7 mg/dL (ref 1.7–2.4)

## 2024-08-18 LAB — BODY FLUID CELL COUNT WITH DIFFERENTIAL
Eos, Fluid: 0 %
Lymphs, Fluid: 40 %
Monocyte-Macrophage-Serous Fluid: 40 % — ABNORMAL LOW (ref 50–90)
Neutrophil Count, Fluid: 20 % (ref 0–25)
Total Nucleated Cell Count, Fluid: 589 uL (ref 0–1000)

## 2024-08-18 LAB — PHOSPHORUS: Phosphorus: 3.1 mg/dL (ref 2.5–4.6)

## 2024-08-18 LAB — LACTATE DEHYDROGENASE, PLEURAL OR PERITONEAL FLUID: LD, Fluid: 138 U/L — ABNORMAL HIGH (ref 3–23)

## 2024-08-18 LAB — PROTEIN, PLEURAL OR PERITONEAL FLUID: Total protein, fluid: 3.8 g/dL

## 2024-08-18 LAB — LACTATE DEHYDROGENASE: LDH: 175 U/L (ref 98–192)

## 2024-08-18 LAB — PROTEIN, TOTAL: Total Protein: 6.8 g/dL (ref 6.5–8.1)

## 2024-08-18 MED ORDER — TRANEXAMIC ACID FOR INHALATION
500.0000 mg | Freq: Three times a day (TID) | RESPIRATORY_TRACT | Status: AC
  Administered 2024-08-18 – 2024-08-19 (×6): 500 mg via RESPIRATORY_TRACT
  Filled 2024-08-18 (×6): qty 10

## 2024-08-18 MED ORDER — ADULT MULTIVITAMIN W/MINERALS CH
1.0000 | ORAL_TABLET | Freq: Every day | ORAL | Status: DC
  Administered 2024-08-19 – 2024-08-25 (×7): 1 via ORAL
  Filled 2024-08-18 (×7): qty 1

## 2024-08-18 MED ORDER — HYDRALAZINE HCL 20 MG/ML IJ SOLN
10.0000 mg | Freq: Four times a day (QID) | INTRAMUSCULAR | Status: DC | PRN
  Administered 2024-08-18 – 2024-08-22 (×3): 10 mg via INTRAVENOUS
  Filled 2024-08-18 (×3): qty 1

## 2024-08-18 MED ORDER — GLUCERNA SHAKE PO LIQD
237.0000 mL | Freq: Three times a day (TID) | ORAL | Status: DC
  Administered 2024-08-20 – 2024-08-25 (×4): 237 mL via ORAL
  Filled 2024-08-18 (×24): qty 237

## 2024-08-18 MED ORDER — DEXTROMETHORPHAN POLISTIREX ER 30 MG/5ML PO SUER
60.0000 mg | Freq: Once | ORAL | Status: AC
  Administered 2024-08-19: 60 mg via ORAL
  Filled 2024-08-18: qty 10

## 2024-08-18 NOTE — Progress Notes (Signed)
 Pleural fluid analysis  Exudative effusion.  Await cytology  Chest x-ray with moderate effusion, no pneumothorax

## 2024-08-18 NOTE — Procedures (Signed)
 Thoracentesis  Procedure Note  ETOLA MULL  [id number]  June 05, 1951  Date:08/18/24  Time:11:14 AM   Provider Performing:Dominigue Gellner A Lamar Naef   Procedure: Thoracentesis with imaging guidance (67444)  Indication(s) Pleural Effusion  Consent Risks of the procedure as well as the alternatives and risks of each were explained to the patient and/or caregiver.  Consent for the procedure was obtained and is signed in the bedside chart  Anesthesia Topical only with 1% lidocaine     Time Out Verified patient identification, verified procedure, site/side was marked, verified correct patient position, special equipment/implants available, medications/allergies/relevant history reviewed, required imaging and test results available.   Sterile Technique Maximal sterile technique including full sterile barrier drape, hand hygiene, sterile gown, sterile gloves, mask, hair covering, sterile ultrasound probe cover (if used).  Procedure Description Ultrasound was used to identify appropriate pleural anatomy for placement and overlying skin marked.  Area of drainage cleaned and draped in sterile fashion. Lidocaine  was used to anesthetize the skin and subcutaneous tissue.  500 cc's of clear amber appearing fluid was drained from the right pleural space. Catheter then removed and bandaid applied to site.   Complications/Tolerance None; patient tolerated the procedure well. Chest X-ray is ordered to confirm no post-procedural complication.   EBL none   Specimen(s) Pleural fluid

## 2024-08-18 NOTE — Hospital Course (Addendum)
 73 y.o. female with a hx of RLL cancer, ILD, COPD, DM2, CAD, HTN, HLD, chronic respiratory failure who presented with Hemoptysis and progressive dyspnea since her prior hospitalization.  She was treated with TXA nebs with improvement in hemoptysis.  She was also found to have worsening pleural effusion status post thoracentesis Seen by pulmonary and cardiology Cytology from thoracentesis on 08/18/2024 noted to have malignant cells>Oncology aware with no change recommended and plans for now, continuing on serial thoracentesis at discharge versus consideration of Pleurx catheter patient followed by palliative care and oncology. She wants to continue on intermittent thoracentesis and fu with onco and PMT as outpatient. Stable from respiratory stand point.  Subjective: Seen and examined today Overnight afebrile noted blood sugar in 400s last night level fluctuating was as low as 50s In am. Resting comfortably respiration and breathing at baseline Patient's son at the bedside She is thinking whether to go to rehab versus home She does not want to go to CIR and would like to get discharged home today.  Discharge diagnosis:   Acute on chronic hypoxic respiratory failure Malignant pleural effusion: Lung cancer: Followed by pulmonary, oncology also aware, managed by thoracentesis 8/23, 8/27 with resultant reexpansion pulmonary edema leading to more shortness of breath.  Cytology 8/23 suspicious for malignant cells, this is likely malignant, would benefit from Pleurx-an patient is thinking about either do Pleurx versus intermittent thoracentesis, continue to follow-up with palliative care oncology pulmonary. Continue supplemental oxygen, bronchodilators. 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 Follows with Dr. Sherrod and currently on palliative  systemic chemotherapy with carboplatin  and paclitaxel  as well as Keytruda .Palliative care input appreciated continue ongoing GOC   Hemoptysis: likely secondary to underlying malignancy.Managed with tranexamic  nebs-resolved if recurs, recommendation would be for consideration of IR evaluation for possible embolization  COPD/emphysema ILD Continue oxygen, takes 2 L at home, continue bronchodilators,.  Completed steroid course and antibiotics  Essential hypertension-BP stable  sinus tachycardia vs atrial tachycardia: Seen by cardiology they feel that her heart rate is improved with improvement in her pulm status. This is likely sinus tachycardia-but  she may have had a brief atrial tachycardia episode in the ED. Manage respiratory status.  Continue bisoprolol , Cardizem  Amlodipine  has been discontinued Zio patch being planned for at discharge   Metabolic acidosis s/p treatment   Hypokalemia Hypophosphatemia Hypomagnesemia Repleted  Normocytic Anemia  Hgb/Hct Trend relatively stable and last check was 9.2/32.2 w/ MCV of 88.5.Anemia Panel and showed iron level of 27, TIBC 194, TIBC 221, saturation of 12%, ferritin 1169, vitamin B12 1832.  CTM for S/Sx of Bleeding;No overt bleeding noted   Diabetes Mellitus Type 2 aA1c 9.3, elevated; at risk for hyperglycemia due to steroid.  Given sliding scale patient having fluctuation in blood sugar regimen, home meds glipizide  and metformin  resumed blood sugar fairly stable without- hypoglycemia Recent Labs  Lab 08/24/24 1609 08/24/24 1714 08/24/24 2220 08/25/24 0757 08/25/24 1129  GLUCAP 78 176* 416* 283* 260*    CAD HLD: No further mortality benefit with statin, discontinued   Anxiety Cont tarax 25 mg p.o. 3 times daily  GOC: DNR. At high risk for readmission. Sen by PMT She patient also seems to indicate that she does not want to have a life moving back and forth to the hospital.  Her palliative chemo she felt she tolerated okay but  then  felt pretty bad a week or 2 later.  We discussed that it is difficult to tell whether this is a manifestation of the chemotherapy versus just fluid accumulation and lung collapse coincidentally soon after her chemotherapy.  After a long time discussing options the patient would like to continue attempts at palliative chemotherapy for more time and repeated thoracentesis to keep pleural effusions under control.  She is clear that if she does not tolerate these treatments that she can change gears and make a different decision down the road.  I offered, and she accepted, referral to outpatient palliative care and a cancer center with [facility]   Mobility: PT Orders: Active PT Follow up Rec: Home Health Pt (Pt States She Is Currently On Caseload)08/24/2024 1600   DVT prophylaxis: SCDs Start: 08/16/24 2332 Code Status:   Code Status: Limited: Do not attempt resuscitation (DNR) -DNR-LIMITED -Do Not Intubate/DNI  Family Communication: plan of care discussed with patient/son at bedside. Patient status is: Remains hospitalized because of severity of illness Level of care: Progressive   Dispo: The patient is from: home.            Anticipated disposition: will dc home.  Objective: Vitals last 24 hrs: Vitals:   08/25/24 0744 08/25/24 1248 08/25/24 1305 08/25/24 1331  BP:   (!) 117/56 (!) 145/66  Pulse:    89  Resp:    16  Temp:    98.9 F (37.2 C)  TempSrc:    Oral  SpO2: 97% 97%  98%  Weight:      Height:       Physical Examination: General exam: alert awake, oriented, older than stated age HEENT:Oral mucosa moist, Ear/Nose WNL grossly Respiratory system: Bilaterally diminished BS,no use of accessory muscle Cardiovascular system: S1 & S2 +, No JVD. Gastrointestinal system: Abdomen soft,NT,ND, BS+ Nervous System: Alert, awake, moving all extremities,and following commands. Extremities: LE edema neg, distal extremities warm.  Skin: No rashes,no icterus. MSK: Normal muscle bulk,tone, power

## 2024-08-18 NOTE — Progress Notes (Signed)
 Initial Nutrition Assessment  DOCUMENTATION CODES:   Not applicable  INTERVENTION:   Glucerna Shake po TID, each supplement provides 220 kcal and 10 grams of protein  MVI po daily   Pt at refeed risk; recommend monitor potassium, magnesium  and phosphorus labs daily until stable  Daily weights  NUTRITION DIAGNOSIS:   Increased nutrient needs related to cancer and cancer related treatments as evidenced by estimated needs.  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  RD working remotely.  -Pt s/p thoracentesis today with output  Per chart review, pt with fairly good appetite and oral intake at baseline. Pt eating 100% of meals in hospital. Per chart, pt appears weight stable pta. Pt was being followed by the RD at the [facility] while receiving treatment. Pt previously drinking Glucerna. RD will add supplements and MVI to help pt meet her estimated needs. Pt is at refeed risk. RD will obtain exam and history at follow up.   Medications reviewed and include: insulin , protonix , prednisone , NaCl @100ml /hr, ceftriaxone    Labs reviewed: K 3.6 wnl, P 3.1 wnl, Mg 1.7 wnl Iron 19(L), TIBC 241(L), ferritin 198 wnl, folate 15.9, B12 1978(H)- 8/22 Wbc- 12.9(H), Hgb 8.9(L), Hct 31.1(L), MCH 25.1(L), MCHC 28.5(L) Cbgs- 196, 242, 267 x 24 hrs  AIC 9.3(H)- 8/22  NUTRITION - FOCUSED PHYSICAL EXAM: Unable to perform at this time   Diet Order:   Diet  Order             Diet Carb Modified Fluid consistency: Thin; Room service appropriate? Yes  Diet effective now                  EDUCATION NEEDS:   No education needs have been identified at this time  Skin:  Skin Assessment: Reviewed RN Assessment  Last BM:  8/22- type 5  Height:   Ht Readings from Last 1 Encounters:  08/18/24 5' 4 (1.626 m)    Weight:   Wt Readings from Last 1 Encounters:  08/18/24 69.9 kg    Ideal Body Weight:  54.5 kg  BMI:  Body mass index is 26.45 kg/m.  Estimated Nutritional Needs:   Kcal:  1700-2000kcal/day  Protein:  85-100g/day  Fluid:  1.7-2.0L/day  Augustin Shams MS, RD, LDN If unable to be reached, please send secure chat to RD inpatient available from 8:00a-4:00p daily

## 2024-08-18 NOTE — Progress Notes (Signed)
 PROGRESS NOTE    Lindsey Williamson  FMW:[id number] DOB: 23-Jun-1951 DOA: 08/16/2024 PCP: [name], MD   Brief Narrative:  Lindsey Williamson is a 73 y.o. female with a hx of RLL cancer, ILD, COPD, DM2, CAD, HTN, HLD, chronic respiratory failure who presents with Hemoptysis and progressive dyspnea since her prior hospitalization.  Found to have a worsening pleural effusion as well and had some sinus tachycardia versus atrial tachycardia.  She received TXA nebs and pulmonary cardiology been consulted.  She has undergone a thoracentesis per pulmonary and cardiology has adjusted her medications.  Assessment and Plan:  Metabolic acidosis: 8/21 Bicarb level 12 very low; Bicarbonate 100 mEq every hourly x 2 doses given; Repeat bicarb level 16, another dose of bicarbonate 100 mEq x 1 given.  Now her metabolic acidosis improved and she has a CO2 of 23, anion gap of 12, chloride level of 105    COPD exacerbation ILD and chronic respiratory failure on 2 L at home Continue supplemental O2 inhalation Empiric antibiotics ceftriaxone  IV Solu-Medrol  followed by tapering prednisone  DuoNeb every 6 hourly Mucinex  600 mg p.o. twice daily Anoro Ellipta  inhaler   Sinus tachycardia versus atrial tachycardia: Continue to monitor on telemetry Seen by cardiology they feel that her heart rate is improved with improvement in her pulm status.  They believe this is most likely sinus tachycardia but she may have had a brief atrial tachycardia episode in the ED. Continue bisoprolol , started Cardizem  CD 120 mg p.o. daily and cardiology recommending consolidating to LA on discharge, Discontinued amlodipine  Follow cardiology for further recommendation   Essential Hypertension: PTA amlodipine  discontinued; C/w Bisoprolol  and  Cardizem  CD 120 mg p.o. daily. 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 Follows with Dr. Gatha and currently on palliative systemic chemotherapy with carboplatin  and paclitaxel  as well as Keytruda    Hemoptysis: Improved with TXA nebs and Most likely secondary to underlying malignancy Seen by PCCM/pulmonary; Currently no intervention but if worsens they Recommended to consultation with IR to see if there is a target bronchial artery that could be embolized and ablated. Other option would be discussion with thoracic surgery regarding possible APC or laser via bronchoscopy. Pulmonary feels Standard bronchoscopy would not really offer much in the way of hemostasis, although could treat tumor with epinephrine  for some temporary effect -risk probably not worth the low benefit. Cryotherapy by bronchoscopy would allow some tumor debridement but would probably not decrease frequency of bleeding.  Continuing TXA every 8 hours per pulmonary  Right Sided Pleural Effusion: Pulmonary pursing Thoracentesis for further evaluation. They note is likely related to a neoplastic process however this not been proven cytologically  Normocytic Anemia: Hgb/Hct Trend:  Recent Labs  Lab 07/23/24 0839 07/30/24 0829 08/01/24 1515 08/13/24 0843 08/16/24 1529 08/17/24 0349 08/18/24 0321  HGB 9.7* 9.9* 10.2* 10.0* 9.7* 9.0* 8.9*  HCT 31.0* 31.8* 32.4* 32.1* 33.6* 30.8* 31.1*  MCV 82.4 81.7 82.2 83.4 87.0 88.5 87.9  -Check Anemia Panel in the AM. CTM for S/Sx of Bleeding; No overt bleeding noted. Repeat CBC in the AM   Uncontrolled Diabetes Mellitus Type 2: Hemoglobin A1c 9.3, elevated; At risk for hyperglycemia due to steroid. C/w NovoLog  sliding scale and Monitor CBG, diabetic diet   CAD and HLD: Continue statin; Held aspirin  for now due to hemoptysis   Anxiety:  C/w Atarax  25 mg p.o. 3 times daily  Overweight: Complicates overall prognosis and care. Estimated body mass index is 26.45 kg/m as calculated from the following:   Height as  of this encounter: 5' 4 (1.626 m).   Weight as of this encounter: 69.9 kg. Weight Loss and Dietary Counseling given   DVT prophylaxis: SCDs Start: 08/16/24 2332    Code Status: Full Code Family Communication: No family currently at bedside  Disposition Plan:  Level of care: Progressive Status is: Inpatient Remains inpatient appropriate because: Needs further clinical improvement and clearance by specialists   Consultants:  Cardiology Pulmonary  Procedures:  As delineated as above  Antimicrobials:  Anti-infectives (From admission, onward)    Start     Dose/Rate Route Frequency Ordered Stop   08/17/24 1600  cefTRIAXone  (ROCEPHIN ) 1 g in sodium chloride  0.9 % 100 mL IVPB        1 g 200 mL/hr over 30 Minutes Intravenous Every 24 hours 08/16/24 2331 08/22/24 1559   08/16/24 1900  cefTRIAXone  (ROCEPHIN ) 2 g in sodium chloride  0.9 % 100 mL IVPB        2 g 200 mL/hr over 30 Minutes Intravenous  Once 08/16/24 1849 08/16/24 1955   08/16/24 1900  doxycycline  (VIBRAMYCIN ) 100 mg in sodium chloride  0.9 % 250 mL IVPB        100 mg 125 mL/hr over 120 Minutes Intravenous  Once 08/16/24 1849 08/16/24 2218       Subjective: Seen and examined at bedside and states her hemoptysis is doing better and she had a little bit this morning but not as much as what she was having.  Shortness of breath is doing okay as well.  No lightheadedness or dizziness.  Feels okay otherwise.  Objective: Vitals:   08/18/24 2000 08/18/24 2001 08/18/24 2031 08/18/24 2100  BP: (!) 180/83 (!) 180/83 (!) 196/89 (!) 172/90  Pulse: 91  98 97  Resp: (!) 27  (!) 25 (!) 29  Temp:      TempSrc:      SpO2: 94%  95% 98%  Weight:      Height:        Intake/Output Summary (Last 24 hours) at 08/18/2024 2128 Last data filed at 08/18/2024 2020 Gross per 24 hour  Intake 2420.33 ml  Output 1450 ml  Net 970.33 ml   Filed Weights   08/16/24 1434 08/18/24 0315  Weight: 66.2 kg 69.9 kg   Examination: Physical  Exam:  Constitutional: Overweight chronically ill-appearing African-American female in no acute distress Respiratory: Diminished to auscultation bilaterally, no wheezing, rales, rhonchi or crackles. Normal respiratory effort and patient is not tachypenic. No accessory muscle use.  Unlabored breathing Cardiovascular: RRR, no murmurs / rubs / gallops. S1 and S2 auscultated.  Minimal extremity edema Abdomen: Soft, non-tender, slightly distended secondary to body habitus. Bowel sounds positive.  GU: Deferred. Musculoskeletal: No clubbing / cyanosis of digits/nails. No joint deformity upper and lower extremities.  Skin: No rashes, lesions, ulcers on limited skin evaluation. No induration; Warm and dry.  Neurologic: CN 2-12 grossly intact with no focal deficits. Romberg sign and cerebellar reflexes not assessed.  Psychiatric: Normal judgment and insight. Alert and oriented x 3. Normal mood and appropriate affect.   Data Reviewed: I have personally reviewed following labs and imaging studies  CBC: Recent Labs  Lab 08/13/24 0843 08/16/24 1529 08/17/24 0349 08/18/24 0321  WBC 10.9* 12.4* 12.5* 12.9*  NEUTROABS 8.6* 10.0*  --   --   HGB 10.0*  9.7* 9.0* 8.9*  HCT 32.1* 33.6* 30.8* 31.1*  MCV 83.4 87.0 88.5 87.9  PLT 440* 500* 474* 495*   Basic Metabolic Panel: Recent Labs  Lab 08/13/24 0843 08/16/24 1529 08/16/24 2002 08/17/24 0349 08/17/24 1402 08/18/24 0321  NA 136 139  --  133* 142 140  K 3.7 3.9  --  4.1 3.8 3.6  CL 102 107  --  102 106 105  CO2 20* 17*  --  12* 16* 23  GLUCOSE 224* 154*  --  231* 200* 245*  BUN 10 11  --  12 14 16   CREATININE 0.59 0.61  --  0.67 0.80 0.78  CALCIUM  9.1 9.6  --  8.8* 9.1 8.6*  MG  --   --  1.5* 2.0  --  1.7  PHOS  --   --  3.0 3.5  --  3.1   GFR: Estimated Creatinine Clearance: 59.2 mL/min (by C-G formula based on SCr of 0.78 mg/dL). Liver Function Tests: Recent Labs  Lab 08/13/24 0843 08/16/24 1529 08/17/24 0349 08/18/24 1142  AST  11* 15 11*  --   ALT <5 <5 5  --   ALKPHOS 87 82 75  --   BILITOT 0.3 0.9 1.2  --   PROT 7.4 8.2* 7.5 6.8  ALBUMIN 3.5 2.8* 2.6*  --    No results for input(s): LIPASE, AMYLASE in the last 168 hours. No results for input(s): AMMONIA in the last 168 hours. Coagulation Profile: Recent Labs  Lab 08/16/24 1612  INR 1.0   Cardiac Enzymes: Recent Labs  Lab 08/16/24 2002  CKTOTAL 27*   BNP (last 3 results) No results for input(s): PROBNP in the last 8760 hours. HbA1C: Recent Labs    08/17/24 0421  HGBA1C 9.3*   CBG: Recent Labs  Lab 08/18/24 0312 08/18/24 0801 08/18/24 1203 08/18/24 1622 08/18/24 1933  GLUCAP 242* 196* 281* 302* 345*   Lipid Profile: No results for input(s): CHOL, HDL, LDLCALC, TRIG, CHOLHDL, LDLDIRECT in the last 72 hours. Thyroid  Function Tests: No results for input(s): TSH, T4TOTAL, FREET4, T3FREE, THYROIDAB in the last 72 hours. Anemia Panel: Recent Labs    08/17/24 0421 08/17/24 1402  VITAMINB12  --  1,978*  FOLATE  --  15.9  FERRITIN  --  198  TIBC  --  241*  IRON  --  19*  RETICCTPCT 2.2  --    Sepsis Labs: Recent Labs  Lab 08/16/24 2002  LATICACIDVEN 1.1   Recent Results (from the past 240 hours)  Resp panel by RT-PCR (RSV, Flu A&B, Covid) Anterior Nasal Swab     Status: None   Collection Time: 08/16/24  9:24 PM   Specimen: Anterior Nasal Swab  Result Value Ref Range Status   SARS Coronavirus 2 by RT PCR NEGATIVE NEGATIVE Final    Comment: (NOTE) SARS-CoV-2 target nucleic acids are NOT DETECTED.  The SARS-CoV-2 RNA is generally detectable in upper respiratory specimens during the acute phase of infection. The lowest concentration of SARS-CoV-2 viral copies this assay can detect is 138 copies/mL. A negative result does not preclude SARS-Cov-2 infection and should not be used as the sole basis for treatment or other patient management decisions. A negative result may occur with  improper specimen  collection/handling, submission of specimen other than nasopharyngeal swab, presence of viral mutation(s) within the areas targeted by this assay, and inadequate number of viral copies(<138 copies/mL). A negative result must be combined with clinical observations, patient history, and epidemiological information. The expected  result is Negative.  Fact Sheet for Patients:  BloggerCourse.com  Fact Sheet for Healthcare Providers:  SeriousBroker.it  This test is no t yet approved or cleared by the [place]  FDA and  has been authorized for detection and/or diagnosis of SARS-CoV-2 by FDA under an Emergency Use Authorization (EUA). This EUA will remain  in effect (meaning this test can be used) for the duration of the COVID-19 declaration under Section 564(b)(1) of the Act, 21 U.S.C.section 360bbb-3(b)(1), unless the authorization is terminated  or revoked sooner.       Influenza A by PCR NEGATIVE NEGATIVE Final   Influenza B by PCR NEGATIVE NEGATIVE Final    Comment: (NOTE) The Xpert Xpress SARS-CoV-2/FLU/RSV plus assay is intended as an aid in the diagnosis of influenza from Nasopharyngeal swab specimens and should not be used as a sole basis for treatment. Nasal washings and aspirates are unacceptable for Xpert Xpress SARS-CoV-2/FLU/RSV testing.  Fact Sheet for Patients: BloggerCourse.com  Fact Sheet for Healthcare Providers: SeriousBroker.it  This test is not yet approved or cleared by the [place]  FDA and has been authorized for detection and/or diagnosis of SARS-CoV-2 by FDA under an Emergency Use Authorization (EUA). This EUA will remain in effect (meaning this test can be used) for the duration of the COVID-19 declaration under Section 564(b)(1) of the Act, 21 U.S.C. section 360bbb-3(b)(1), unless the authorization is terminated or revoked.     Resp Syncytial  Virus by PCR NEGATIVE NEGATIVE Final    Comment: (NOTE) Fact Sheet for Patients: BloggerCourse.com  Fact Sheet for Healthcare Providers: SeriousBroker.it  This test is not yet approved or cleared by the [place]  FDA and has been authorized for detection and/or diagnosis of SARS-CoV-2 by FDA under an Emergency Use Authorization (EUA). This EUA will remain in effect (meaning this test can be used) for the duration of the COVID-19 declaration under Section 564(b)(1) of the Act, 21 U.S.C. section 360bbb-3(b)(1), unless the authorization is terminated or revoked.  Performed at [facility], [address], [place], [place] [address]   MRSA Next Gen by PCR, Nasal     Status: None   Collection Time: 08/17/24  2:05 PM   Specimen: Nasal Mucosa; Nasal Swab  Result Value Ref Range Status   MRSA by PCR Next Gen NOT DETECTED NOT DETECTED Final    Comment: (NOTE) The GeneXpert MRSA Assay (FDA approved for NASAL specimens only), is one component of a comprehensive MRSA colonization surveillance program. It is not intended to diagnose MRSA infection nor to guide or monitor treatment for MRSA infections. Test performance is not FDA approved in patients less than 20 years old. Performed at [facility], [address], [place], [place] [address]   Body fluid culture w Gram Stain     Status: None (Preliminary result)   Collection Time: 08/18/24 11:13 AM   Specimen: Pleural Fluid  Result Value Ref Range Status   Specimen Description   Final    PLEURAL Performed at [facility], [address], [place], [place] [address]    Special Requests   Final    NONE Performed at [facility], [address], [place], [place] [address]    Gram Stain   Final    WBC PRESENT,BOTH PMN AND MONONUCLEAR RED BLOOD CELLS PRESENT NO ORGANISMS SEEN Performed at [facility], [address], [place], [place] [address]    Culture PENDING  Incomplete   Report Status PENDING  Incomplete    Radiology Studies: DG CHEST PORT 1 VIEW Result Date: 08/18/2024 CLINICAL DATA:  Pleural effusion.  Lung carcinoma.  Thoracentesis. EXAM: PORTABLE CHEST 1 VIEW COMPARISON:  05/18/2024 FINDINGS: Moderate right pleural effusion shows increase in size since prior exam. Increased right lower lung atelectasis. No pneumothorax visualized. Chronic interstitial lung disease again noted. Stable heart size. IMPRESSION: Increased size of moderate right pleural effusion and right lower lung atelectasis. No pneumothorax visualized. Chronic interstitial lung disease. Electronically Signed   By: Norleen DELENA Kil M.D.   On: 08/18/2024 11:57   Scheduled Meds:  bisoprolol   10 mg Oral Daily   Chlorhexidine  Gluconate Cloth  6 each Topical Q0600   diltiazem   60 mg Oral Q8H   feeding supplement (GLUCERNA SHAKE)  237 mL Oral TID BM   guaiFENesin   600 mg Oral BID   hydrOXYzine   25 mg Oral TID   insulin  aspart  0-9 Units Subcutaneous Q4H   ipratropium-albuterol   3 mL Nebulization QID   [START ON 08/19/2024] multivitamin with minerals  1 tablet Oral Daily   pantoprazole   40 mg Oral Daily   pravastatin   80 mg Oral QHS   predniSONE   40 mg Oral Q breakfast   tranexamic acid   500 mg Nebulization Q8H   umeclidinium-vilanterol  1 puff Inhalation Daily   Continuous Infusions:  sodium chloride  100 mL/hr at 08/18/24 1733   cefTRIAXone  (ROCEPHIN )  IV Stopped (08/18/24 1615)    LOS: 2 days   Alejandro Marker, DO [facility] Available via Epic secure chat 7am-7pm After these hours, please refer to coverage provider listed on [email or web address] 08/18/2024, 9:28 PM

## 2024-08-18 NOTE — Progress Notes (Signed)
 Subjective:   Breathing better with no hemoptysis HR much lower With good sats  Objective:  Vitals:   08/18/24 0805 08/18/24 0835 08/18/24 0837 08/18/24 0844  BP: (!) 160/71     Pulse:  88    Resp:  19    Temp:      TempSrc:      SpO2:  94% 100% 96%  Weight:      Height:        Intake/Output from previous day:  Intake/Output Summary (Last 24 hours) at 08/18/2024 0845 Last data filed at 08/18/2024 0744 Gross per 24 hour  Intake 3136.52 ml  Output 300 ml  Net 2836.52 ml    Physical Exam:  Chronically ill black female Cushingoid Rhonchi/wheezing bilateral  No murmur Abdomen benign Trace edema  Lab Results: Basic Metabolic Panel: Recent Labs    08/17/24 0349 08/17/24 1402 08/18/24 0321  NA 133* 142 140  K 4.1 3.8 3.6  CL 102 106 105  CO2 12* 16* 23  GLUCOSE 231* 200* 245*  BUN 12 14 16   CREATININE 0.67 0.80 0.78  CALCIUM  8.8* 9.1 8.6*  MG 2.0  --  1.7  PHOS 3.5  --  3.1   Liver Function Tests: Recent Labs    08/16/24 1529 08/17/24 0349  AST 15 11*  ALT <5 5  ALKPHOS 82 75  BILITOT 0.9 1.2  PROT 8.2* 7.5  ALBUMIN 2.8* 2.6*   No results for input(s): LIPASE, AMYLASE in the last 72 hours. CBC: Recent Labs    08/16/24 1529 08/17/24 0349 08/18/24 0321  WBC 12.4* 12.5* 12.9*  NEUTROABS 10.0*  --   --   HGB 9.7* 9.0* 8.9*  HCT 33.6* 30.8* 31.1*  MCV 87.0 88.5 87.9  PLT 500* 474* 495*   Cardiac Enzymes: Recent Labs    08/16/24 2002  CKTOTAL 27*   BNP: Invalid input(s): POCBNP D-Dimer: No results for input(s): DDIMER in the last 72 hours. Hemoglobin A1C: Recent Labs    08/17/24 0421  HGBA1C 9.3*    Anemia Panel: Recent Labs    08/17/24 0421 08/17/24 1402  VITAMINB12  --  1,978*  FOLATE  --  15.9  FERRITIN  --  198  TIBC  --  241*  IRON  --  19*  RETICCTPCT 2.2  --     Imaging: CT Angio Chest PE W and/or Wo Contrast Result Date: 08/16/2024 CLINICAL DATA:  Shortness of breath, hemoptysis. History of lung  cancer. EXAM: CT ANGIOGRAPHY CHEST WITH CONTRAST TECHNIQUE: Multidetector CT imaging of the chest was performed using the standard protocol during bolus administration of intravenous contrast. Multiplanar CT image reconstructions and MIPs were obtained to evaluate the vascular anatomy. RADIATION DOSE REDUCTION: This exam was performed according to the departmental dose-optimization program which includes automated exposure control, adjustment of the mA and/or kV according to patient size and/or use of iterative reconstruction technique. CONTRAST:  75mL OMNIPAQUE  IOHEXOL  350 MG/ML SOLN COMPARISON:  July 16, 2024.  June 28, 2024. FINDINGS: Cardiovascular: Satisfactory opacification of the pulmonary arteries to the segmental level. No evidence of pulmonary embolism. Mild cardiomegaly. No pericardial effusion. Mediastinum/Nodes: Thyroid  gland is unremarkable. The esophagus is unremarkable. Extensive adenopathy is noted in the subcarinal and right hilar regions as noted on recent PET scan. This mass appears to extend into and occlude the right lower lobe bronchus proximally. Lungs/Pleura: No pneumothorax is noted. Emphysematous disease is noted. 3.7 cm mass noted posteriorly in left lower lobe consistent with metastatic disease. Moderate size  right pleural effusion is noted with adjacent atelectasis or infiltrate of residual right upper lobe. Upper Abdomen: No acute abnormality. Musculoskeletal: No chest wall abnormality. No acute or significant osseous findings. Review of the MIP images confirms the above findings. IMPRESSION: 1. No definite evidence of pulmonary embolus. 2. Extensive subcarinal and right hilar adenopathy is noted as noted on recent PET scan. This mass appears to extend into and occlude the right lower lobe bronchus proximally. 3. Moderate size right pleural effusion is noted with adjacent atelectasis or infiltrate of residual right upper lobe. 4. 3.7 cm mass noted posteriorly in left lower lobe  consistent with metastatic disease. 5. Emphysema. Aortic Atherosclerosis (ICD10-I70.0) and Emphysema (ICD10-J43.9). Electronically Signed   By: Lynwood Landy Raddle M.D.   On: 08/16/2024 17:30    Cardiac Studies:  ECG:  ST    Telemetry: SR rates 80-90  Echo: 02/15/24 EF 65-70%  Medications:    bisoprolol   10 mg Oral Daily   Chlorhexidine  Gluconate Cloth  6 each Topical Q0600   diltiazem   60 mg Oral Q8H   guaiFENesin   600 mg Oral BID   hydrOXYzine   25 mg Oral TID   insulin  aspart  0-9 Units Subcutaneous Q4H   ipratropium-albuterol   3 mL Nebulization QID   pantoprazole   40 mg Oral Daily   pravastatin   80 mg Oral QHS   predniSONE   40 mg Oral Q breakfast   tranexamic acid   500 mg Nebulization Q8H   umeclidinium-vilanterol  1 puff Inhalation Daily      sodium chloride  100 mL/hr at 08/18/24 0744   sodium chloride      cefTRIAXone  (ROCEPHIN )  IV Stopped (08/17/24 1640)    Assessment/Plan:   Tachycardia:  improved with improvement in pulmonary status. Mostly Sinus Tachycarcia maybe brief episode atrial tachycardia in ER. No PAF/.  Continue bisoprolol  10 mg and cardizem  Can consolidate to LA on d/c.  Pulmonary: stage 4 lung cancer. Seen by Dr Shelah. On rocephin  ? Pneumonia. Considering Pleur X cathter for malignant right effusion for symptomatic relief. Hemoptysis from endobronchial lesion not clear what best approach would be Hct stable 31.1   Lindsey Williamson 08/18/2024, 8:45 AM

## 2024-08-18 NOTE — Progress Notes (Signed)
 PT Cancellation Note  Patient Details Name: Lindsey Williamson MRN: [id number] DOB: [birth year]   Cancelled Treatment:     PT order received but eval deferred at request of pt - c/o fatigue following thoracentesis this am.  Will follow.    Tracey Hermance 08/18/2024, 3:23 PM

## 2024-08-18 NOTE — Progress Notes (Signed)
 NAME:  Lindsey Williamson, MRN:  [id number], DOB:  Jan 03, 1951, LOS: 2 ADMISSION DATE:  08/16/2024, CONSULTATION DATE: 08/17/2024 REFERRING MD: Dr. 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  Followed in our office by Dr Neda, chronic hypoxemic respiratory failure on continuous O2.  Repeat bronchoscopy 05/19/2024 for hemoptysis showed an endobronchial lesion in the bronchus intermedius extending to the right lower lobe with associated bleeding.  Followed conservatively. She underwent chemoradiation with right upper lobe XRT, and then Imfinzi  complicated by associated pneumonitis June 2025 treated with steroids.  Most recent imaging included PET scan 07/16/2024 that showed progressive disease in the right lower lobe with associated complete atelectasis, contralateral left lower lobe metastasis (versus primary), progressive hilar and mediastinal adenopathy.  Current therapy is carboplatin /Paclitaxol + Keytruda .  Has continued to have cough, tried Hydromet with only partial success.  Her treatment was delayed and she was treated for possible bronchitis with levofloxacin  08/13/2024. Admitted now with progressive dyspnea, increased hemoptysis since the May hospitalization and bronchoscopy.  In the emergency department she received TXA nebs. She describes bloody mucous, at it's heaviest about 1/2 a cup on day prior to presentation. Usually bright red, sometimes dark spotty clots. Last time she saw any was this am - scant dark blood (after the TXA).     Pertinent  Medical History   Past Medical History:  Diagnosis Date   Angioedema 10/17/2023   Arthritis    Diabetes mellitus without complication (HCC)    Hypertension    Significant Hospital Events: Including  procedures, antibiotic start and stop dates in addition to other pertinent events   8/21 CT-PA >> no pulmonary embolism, extensive subcarinal and right hilar adenopathy that appears to extend and occlude the right lower lobe bronchus proximally.  3.7 cm left lower lobe mass.  Moderate right pleural effusion with associated right upper lobe atelectasis  Interim History / Subjective:  Uneventful night Still has a cough Pinkish phlegm, no bright red blood  Objective    Blood pressure (!) 156/74, pulse 84, temperature 97.7 F (36.5 C), temperature source Oral, resp. rate (!) 25, height 5' 4 (1.626 m), weight 69.9 kg, SpO2 95%.    FiO2 (%):  [28 %] 28 %   Intake/Output Summary (Last 24 hours) at 08/18/2024 0800 Last data filed at 08/18/2024 0744 Gross per 24 hour  Intake 3136.52 ml  Output 300 ml  Net 2836.52 ml   Filed Weights   08/16/24 1434 08/18/24 0315  Weight: 66.2 kg 69.9 kg    Examination: General: Chronically ill-appearing, appears comfortable, on oxygen supplementation HENT: Moist oral mucosa Lungs: Bilateral rhonchi, decreased air movement on the right Cardiovascular: S1-S2 appreciated Abdomen: Soft, bowel sounds appreciated Extremities: No clubbing, no edema Neuro: Awake alert moving extremities appropriately GU:   I reviewed last 24 h vitals and pain scores, last 48 h intake and output, last 24 h labs and trends, and last 24 h imaging results.  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 Gatha - On palliative systemic chemotherapy with carboplatin  and paclitaxel , Keytruda   Hemoptysis - Did improve with tranexamic acid  - Will continue tranexamic acid  every 8  Chronic obstructive pulmonary disease/emphysema -Bronchodilator treatment  Chronic respiratory failure - Oxygen  supplementation  New right-sided pleural effusion worse from previous - Likely related to underlying neoplastic process however not proven cytologically  Diabetes, hypertension, hyperlipidemia  Will plan for thoracentesis, if cytology positive, upgrades staging to metastatic disease unfortunately May benefit from Pleurx catheter placement  Options for management of the hemoptysis may have to include bronchial artery embolization if she continues to bleed significantly despite ongoing treatment  Flexible bronchoscopy will likely not be of any benefit as we already know she does have endobronchial involvement  Best Practice (right click and Reselect all SmartList Selections daily)   Per Primary service Labs   CBC: Recent Labs  Lab 08/13/24 0843 08/16/24 1529 08/17/24 0349 08/18/24 0321  WBC 10.9* 12.4* 12.5* 12.9*  NEUTROABS 8.6* 10.0*  --   --   HGB 10.0* 9.7* 9.0* 8.9*  HCT 32.1* 33.6* 30.8* 31.1*  MCV 83.4 87.0 88.5 87.9  PLT 440* 500* 474* 495*    Basic Metabolic Panel: Recent Labs  Lab 08/13/24 0843 08/16/24 1529 08/16/24 2002 08/17/24 0349 08/17/24 1402 08/18/24 0321  NA 136 139  --  133* 142 140  K 3.7 3.9  --  4.1 3.8 3.6  CL 102 107  --  102 106 105  CO2 20* 17*  --  12* 16* 23  GLUCOSE 224* 154*  --  231* 200* 245*  BUN 10 11  --  12 14 16   CREATININE 0.59 0.61  --  0.67 0.80 0.78  CALCIUM  9.1 9.6  --  8.8* 9.1 8.6*  MG  --   --  1.5* 2.0  --  1.7  PHOS  --   --  3.0 3.5  --  3.1   GFR: Estimated Creatinine Clearance: 59.2 mL/min (by C-G formula based on SCr of 0.78 mg/dL). Recent Labs  Lab 08/13/24 0843 08/16/24 1529 08/16/24 2002 08/17/24 0349 08/18/24 0321  WBC 10.9* 12.4*  --  12.5* 12.9*  LATICACIDVEN  --   --  1.1  --   --     Liver Function Tests: Recent Labs  Lab 08/13/24 0843 08/16/24 1529 08/17/24 0349  AST 11* 15 11*  ALT <5 <5 5  ALKPHOS 87 82 75  BILITOT 0.3 0.9 1.2  PROT 7.4 8.2* 7.5  ALBUMIN 3.5 2.8* 2.6*   No  results for input(s): LIPASE, AMYLASE in the last 168 hours. No results for input(s): AMMONIA in the last 168 hours.  ABG    Component Value Date/Time   HCO3 19.8 (L) 08/16/2024 1612   ACIDBASEDEF 4.0 (H) 08/16/2024 1612   O2SAT 36.5 08/16/2024 1612     Coagulation Profile: Recent Labs  Lab 08/16/24 1612  INR 1.0    Cardiac Enzymes: Recent Labs  Lab 08/16/24 2002  CKTOTAL 27*    HbA1C: Hgb A1c MFr Bld  Date/Time Value Ref Range Status  08/17/2024 04:21 AM 9.3 (H) 4.8 - 5.6 % Final    Comment:    (NOTE) Diagnosis of Diabetes The following HbA1c ranges recommended by the American Diabetes Association (ADA) may be used as an aid in the diagnosis of diabetes mellitus.  Hemoglobin             Suggested A1C NGSP%              Diagnosis  <5.7                   Non Diabetic  5.7-6.4  Pre-Diabetic  >6.4                   Diabetic  <7.0                   Glycemic control for                       adults with diabetes.    02/16/2024 05:59 AM 7.4 (H) 4.8 - 5.6 % Final    Comment:    (NOTE) Pre diabetes:          5.7%-6.4%  Diabetes:              >6.4%  Glycemic control for   <7.0% adults with diabetes     CBG: Recent Labs  Lab 08/17/24 1218 08/17/24 1537 08/17/24 2006 08/18/24 0034 08/18/24 0312  GLUCAP 203* 238* 320* 267* 242*    Review of Systems:   Shortness of breath, cough  Past Medical History:  She,  has a past medical history of Angioedema (10/17/2023), Arthritis, Diabetes mellitus without complication (HCC), and Hypertension.   Surgical History:   Past Surgical History:  Procedure Laterality Date   BRONCHIAL BIOPSY  03/30/2022   Procedure: BRONCHIAL BIOPSIES;  Surgeon: Brenna Adine CROME, DO;  Location: [facility] ENDOSCOPY;  Service: Pulmonary;;   BRONCHIAL BIOPSY  10/17/2023   Procedure: BRONCHIAL BIOPSIES;  Surgeon: Shelah Lamar RAMAN, MD;  Location: [facility] ENDOSCOPY;  Service: Pulmonary;;   BRONCHIAL BRUSHINGS  03/30/2022    Procedure: BRONCHIAL BRUSHINGS;  Surgeon: Brenna Adine CROME, DO;  Location: [facility] ENDOSCOPY;  Service: Pulmonary;;   BRONCHIAL BRUSHINGS  10/17/2023   Procedure: BRONCHIAL BRUSHINGS;  Surgeon: Shelah Lamar RAMAN, MD;  Location: [facility] ENDOSCOPY;  Service: Pulmonary;;   BRONCHIAL NEEDLE ASPIRATION BIOPSY  03/30/2022   Procedure: BRONCHIAL NEEDLE ASPIRATION BIOPSIES;  Surgeon: Brenna Adine CROME, DO;  Location: [facility] ENDOSCOPY;  Service: Pulmonary;;   BRONCHIAL NEEDLE ASPIRATION BIOPSY  10/17/2023   Procedure: BRONCHIAL NEEDLE ASPIRATION BIOPSIES;  Surgeon: Shelah Lamar RAMAN, MD;  Location: [facility] ENDOSCOPY;  Service: Pulmonary;;   BRONCHIAL WASHINGS  05/19/2024   Procedure: IRRIGATION, BRONCHUS;  Surgeon: Jude Harden GAILS, MD;  Location: [facility] ENDOSCOPY;  Service: Cardiopulmonary;;   EYE SURGERY     FIDUCIAL MARKER PLACEMENT  03/30/2022   Procedure: FIDUCIAL MARKER PLACEMENT;  Surgeon: Brenna Adine CROME, DO;  Location: [facility] ENDOSCOPY;  Service: Pulmonary;;   FINE NEEDLE ASPIRATION  10/17/2023   Procedure: FINE NEEDLE ASPIRATION;  Surgeon: Shelah Lamar RAMAN, MD;  Location: [facility] ENDOSCOPY;  Service: Pulmonary;;   FOREIGN BODY REMOVAL  03/30/2022   Procedure: FOREIGN BODY REMOVAL;  Surgeon: Brenna Adine CROME, DO;  Location: [facility] ENDOSCOPY;  Service: Pulmonary;;   HEMOSTASIS CONTROL  10/17/2023   Procedure: HEMOSTASIS CONTROL;  Surgeon: Shelah Lamar RAMAN, MD;  Location: [facility] ENDOSCOPY;  Service: Pulmonary;;   VIDEO BRONCHOSCOPY Bilateral 05/19/2024   Procedure: VIDEO BRONCHOSCOPY WITHOUT FLUORO;  Surgeon: Jude Harden GAILS, MD;  Location: [facility] ENDOSCOPY;  Service: Cardiopulmonary;  Laterality: Bilateral;   VIDEO BRONCHOSCOPY WITH ENDOBRONCHIAL ULTRASOUND Bilateral 03/30/2022   Procedure: VIDEO BRONCHOSCOPY WITH ENDOBRONCHIAL ULTRASOUND;  Surgeon: Brenna Adine CROME, DO;  Location: [facility] ENDOSCOPY;  Service: Pulmonary;  Laterality: Bilateral;   VIDEO BRONCHOSCOPY WITH ENDOBRONCHIAL ULTRASOUND Right 10/17/2023   Procedure: VIDEO BRONCHOSCOPY WITH ENDOBRONCHIAL  ULTRASOUND;  Surgeon: Shelah Lamar RAMAN, MD;  Location: [facility] ENDOSCOPY;  Service: Pulmonary;  Laterality: Right;   VIDEO BRONCHOSCOPY WITH RADIAL ENDOBRONCHIAL ULTRASOUND  03/30/2022   Procedure: VIDEO BRONCHOSCOPY WITH  RADIAL ENDOBRONCHIAL ULTRASOUND;  Surgeon: Brenna Adine CROME, DO;  Location: [facility] ENDOSCOPY;  Service: Pulmonary;;     Social History:   reports that she quit smoking about 19 months ago. Her smoking use included cigarettes. She has been exposed to tobacco smoke. She has never used smokeless tobacco. She reports that she does not drink alcohol and does not use drugs.   Family History:  Her family history includes Allergic rhinitis in her sister; Asthma in her sister. There is no history of Angioedema, Atopy, Immunodeficiency, Urticaria, or Eczema.   Allergies Allergies  Allergen Reactions   Benzonatate Palpitations   Lisinopril Swelling and Other (See Comments)    Patient ended up in the ED with a swollen mouth and tongue   Aspirin  Nausea And Vomiting and Palpitations    Can tolerate baby aspirin  81 mg without difficulty but can't tolerate higher dosages   Azithromycin  Palpitations   Codeine  Palpitations    Patient states that this is not an allergy      Home Medications  Prior to Admission medications   Medication Sig Start Date End Date Taking? Authorizing Provider  albuterol  (VENTOLIN  HFA) 108 (90 Base) MCG/ACT inhaler Inhale 2 puffs into the lungs every 6 (six) hours as needed for wheezing or shortness of breath. 08/07/24  Yes Ellison Leisure A, MD  amLODipine  (NORVASC ) 5 MG tablet Take 5 mg by mouth in the morning. 02/08/22  Yes [provider]  aspirin  EC 81 MG tablet Take 81 mg by mouth daily. Swallow whole.   Yes [provider]  bisoprolol  (ZEBETA ) 5 MG tablet Take 2.5 mg by mouth 2 (two) times daily.   Yes [provider]  cetirizine  (ZYRTEC  ALLERGY ) 10 MG tablet Take 1 tablet (10 mg total) by mouth daily. 03/08/24  Yes Lorin Norris, MD   dexamethasone  (DECADRON ) 4 MG tablet Take 2 tablets (8mg ) by mouth daily starting the day after carboplatin  for 3 days. Take with food 07/23/24  Yes Sherrod Sherrod, MD  diclofenac Sodium (VOLTAREN) 1 % GEL Apply 1 Application topically daily as needed (pain).   Yes [provider]  empagliflozin  (JARDIANCE ) 25 MG TABS tablet Take 25 mg by mouth daily at 6 PM. 02/08/22  Yes [provider]  ferrous sulfate  325 (65 FE) MG tablet Take 325 mg by mouth daily with breakfast.   Yes [provider]  glipiZIDE  (GLUCOTROL  XL) 5 MG 24 hr tablet Take 1 tablet (5 mg total) by mouth daily with breakfast. 04/11/24  Yes Amin, Ankit C, MD  guaiFENesin -codeine  100-10 MG/5ML syrup Take 5 mLs by mouth 3 (three) times daily as needed for cough. 08/13/24  Yes Heilingoetter, Cassandra L, PA-C  insulin  aspart (NOVOLOG ) 100 UNIT/ML FlexPen Inject 0-11 Units into the skin 3 (three) times daily with meals. Check Blood Glucose (BG) and inject per scale: BG <150= 0 unit; BG 150-200= 1 unit; BG 201-250= 3 unit; BG 251-300= 5 unit; BG 301-350= 7 unit; BG 351-400= 9 unit; BG >400= 11 unit and Call Primary Care. Patient taking differently: Inject 0-11 Units into the skin as needed for high blood sugar. Check Blood Glucose (BG) and inject per scale: BG <150= 0 unit; BG 150-200= 1 unit; BG 201-250= 3 unit; BG 251-300= 5 unit; BG 301-350= 7 unit; BG 351-400= 9 unit; BG >400= 11 unit and Call Primary Care. 04/11/24  Yes Amin, Ankit C, MD  levofloxacin  (LEVAQUIN ) 500 MG tablet Take 1 tablet (500 mg total) by mouth daily. 08/13/24  Yes Heilingoetter, Cassandra L, PA-C  metFORMIN (GLUCOPHAGE-XR) 500 MG 24 hr tablet Take 1,000 mg by mouth in the morning and at bedtime. 02/08/22  Yes [provider]  pravastatin  (PRAVACHOL ) 80 MG tablet Take 80 mg by mouth at bedtime.   Yes [provider]  prochlorperazine  (COMPAZINE ) 10 MG tablet Take 1 tablet (10 mg total) by mouth every 6 (six) hours as needed.  07/17/24  Yes Heilingoetter, Cassandra L, PA-C  umeclidinium-vilanterol (ANORO ELLIPTA ) 62.5-25 MCG/ACT AEPB Inhale 1 puff into the lungs daily. 05/09/24  Yes Ruthell Lauraine FALCON, NP  doxycycline  (VIBRA -TABS) 100 MG tablet Take 1 tablet (100 mg total) by mouth 2 (two) times daily. Patient not taking: Reported on 08/16/2024 07/30/24   Heilingoetter, Cassandra L, PA-C  ipratropium-albuterol  (DUONEB) 0.5-2.5 (3) MG/3ML SOLN Take 3 mLs by nebulization in the morning and at bedtime. 05/14/24   Ruthell Lauraine FALCON, NP  lidocaine -prilocaine  (EMLA ) cream Apply to affected area once 07/23/24   Sherrod Sherrod, MD  omeprazole  (PRILOSEC) 20 MG capsule Take 20 mg by mouth daily. Patient not taking: Reported on 08/16/2024    [provider]  ondansetron  (ZOFRAN ) 8 MG tablet Take 1 tablet (8 mg total) by mouth every 8 (eight) hours as needed for nausea or vomiting. Start on the third day after carboplatin . 07/23/24   Sherrod Sherrod, MD  polyethylene glycol (MIRALAX  / GLYCOLAX ) 17 g packet Take 17 g packet by mouth daily. 02/19/24   Adhikari, Amrit, MD  potassium chloride  SA (KLOR-CON  M) 20 MEQ tablet Take 1 tablet (20 mEq total) by mouth daily. Patient not taking: Reported on 08/16/2024 08/06/24   Sherrod Sherrod, MD  predniSONE  (DELTASONE ) 20 MG tablet Take 40 mg by mouth daily with breakfast. Patient not taking: Reported on 08/16/2024    [provider]    Jennet Epley, MD [place] [facility] Pager: See Tracey

## 2024-08-18 NOTE — Plan of Care (Signed)
  Problem: Skin Integrity: Goal: Risk for impaired skin integrity will decrease Outcome: Progressing   Problem: Activity: Goal: Ability to tolerate increased activity will improve Outcome: Progressing   Problem: Respiratory: Goal: Ability to maintain adequate ventilation will improve Outcome: Progressing   Problem: Activity: Goal: Risk for activity intolerance will decrease Outcome: Progressing   Problem: Coping: Goal: Level of anxiety will decrease Outcome: Progressing   Problem: Elimination: Goal: Will not experience complications related to bowel motility Outcome: Progressing Goal: Will not experience complications related to urinary retention Outcome: Progressing   Problem: Pain Managment: Goal: General experience of comfort will improve and/or be controlled Outcome: Progressing   Problem: Safety: Goal: Ability to remain free from injury will improve Outcome: Progressing

## 2024-08-19 ENCOUNTER — Inpatient Hospital Stay (HOSPITAL_COMMUNITY)

## 2024-08-19 DIAGNOSIS — I10 Essential (primary) hypertension: Secondary | ICD-10-CM | POA: Diagnosis not present

## 2024-08-19 DIAGNOSIS — J441 Chronic obstructive pulmonary disease with (acute) exacerbation: Secondary | ICD-10-CM | POA: Diagnosis not present

## 2024-08-19 DIAGNOSIS — R042 Hemoptysis: Secondary | ICD-10-CM | POA: Diagnosis not present

## 2024-08-19 DIAGNOSIS — R000 Tachycardia, unspecified: Secondary | ICD-10-CM | POA: Diagnosis not present

## 2024-08-19 DIAGNOSIS — R0600 Dyspnea, unspecified: Secondary | ICD-10-CM | POA: Diagnosis not present

## 2024-08-19 DIAGNOSIS — C3431 Malignant neoplasm of lower lobe, right bronchus or lung: Secondary | ICD-10-CM | POA: Diagnosis not present

## 2024-08-19 DIAGNOSIS — J9610 Chronic respiratory failure, unspecified whether with hypoxia or hypercapnia: Secondary | ICD-10-CM | POA: Diagnosis not present

## 2024-08-19 DIAGNOSIS — I4719 Other supraventricular tachycardia: Secondary | ICD-10-CM | POA: Diagnosis not present

## 2024-08-19 LAB — CBC WITH DIFFERENTIAL/PLATELET
Abs Immature Granulocytes: 0.1 K/uL — ABNORMAL HIGH (ref 0.00–0.07)
Basophils Absolute: 0 K/uL (ref 0.0–0.1)
Basophils Relative: 0 %
Eosinophils Absolute: 0 K/uL (ref 0.0–0.5)
Eosinophils Relative: 0 %
HCT: 32.2 % — ABNORMAL LOW (ref 36.0–46.0)
Hemoglobin: 9.2 g/dL — ABNORMAL LOW (ref 12.0–15.0)
Immature Granulocytes: 1 %
Lymphocytes Relative: 9 %
Lymphs Abs: 1 K/uL (ref 0.7–4.0)
MCH: 25.3 pg — ABNORMAL LOW (ref 26.0–34.0)
MCHC: 28.6 g/dL — ABNORMAL LOW (ref 30.0–36.0)
MCV: 88.5 fL (ref 80.0–100.0)
Monocytes Absolute: 1.4 K/uL — ABNORMAL HIGH (ref 0.1–1.0)
Monocytes Relative: 13 %
Neutro Abs: 8.8 K/uL — ABNORMAL HIGH (ref 1.7–7.7)
Neutrophils Relative %: 77 %
Platelets: 560 K/uL — ABNORMAL HIGH (ref 150–400)
RBC: 3.64 MIL/uL — ABNORMAL LOW (ref 3.87–5.11)
RDW: 18 % — ABNORMAL HIGH (ref 11.5–15.5)
WBC: 11.3 K/uL — ABNORMAL HIGH (ref 4.0–10.5)
nRBC: 0.2 % (ref 0.0–0.2)

## 2024-08-19 LAB — COMPREHENSIVE METABOLIC PANEL WITH GFR
ALT: 6 U/L (ref 0–44)
AST: 12 U/L — ABNORMAL LOW (ref 15–41)
Albumin: 2.6 g/dL — ABNORMAL LOW (ref 3.5–5.0)
Alkaline Phosphatase: 67 U/L (ref 38–126)
Anion gap: 13 (ref 5–15)
BUN: 11 mg/dL (ref 8–23)
CO2: 22 mmol/L (ref 22–32)
Calcium: 8.8 mg/dL — ABNORMAL LOW (ref 8.9–10.3)
Chloride: 105 mmol/L (ref 98–111)
Creatinine, Ser: 0.47 mg/dL (ref 0.44–1.00)
GFR, Estimated: >60 mL/min (ref >60)
Glucose, Bld: 192 mg/dL — ABNORMAL HIGH (ref 70–99)
Potassium: 3.2 mmol/L — ABNORMAL LOW (ref 3.5–5.1)
Sodium: 140 mmol/L (ref 135–145)
Total Bilirubin: 0.3 mg/dL (ref 0.0–1.2)
Total Protein: 6.6 g/dL (ref 6.5–8.1)

## 2024-08-19 LAB — GLUCOSE, CAPILLARY
Glucose-Capillary: 177 mg/dL — ABNORMAL HIGH (ref 70–99)
Glucose-Capillary: 182 mg/dL — ABNORMAL HIGH (ref 70–99)
Glucose-Capillary: 252 mg/dL — ABNORMAL HIGH (ref 70–99)
Glucose-Capillary: 318 mg/dL — ABNORMAL HIGH (ref 70–99)
Glucose-Capillary: 335 mg/dL — ABNORMAL HIGH (ref 70–99)
Glucose-Capillary: 341 mg/dL — ABNORMAL HIGH (ref 70–99)
Glucose-Capillary: 382 mg/dL — ABNORMAL HIGH (ref 70–99)

## 2024-08-19 LAB — PHOSPHORUS: Phosphorus: 1.9 mg/dL — ABNORMAL LOW (ref 2.5–4.6)

## 2024-08-19 LAB — FOLATE: Folate: 8.8 ng/mL (ref >5.9)

## 2024-08-19 LAB — IRON AND TIBC
Iron: 27 ug/dL — ABNORMAL LOW (ref 28–170)
Saturation Ratios: 12 % (ref 10.4–31.8)
TIBC: 221 ug/dL — ABNORMAL LOW (ref 250–450)
UIBC: 194 ug/dL

## 2024-08-19 LAB — RETICULOCYTES
Immature Retic Fract: 30.3 % — ABNORMAL HIGH (ref 2.3–15.9)
RBC.: 3.65 MIL/uL — ABNORMAL LOW (ref 3.87–5.11)
Retic Count, Absolute: 80.7 K/uL (ref 19.0–186.0)
Retic Ct Pct: 2.2 % (ref 0.4–3.1)

## 2024-08-19 LAB — VITAMIN B12: Vitamin B-12: 1832 pg/mL — ABNORMAL HIGH (ref 180–914)

## 2024-08-19 LAB — FERRITIN: Ferritin: 169 ng/mL (ref 11–307)

## 2024-08-19 LAB — MAGNESIUM: Magnesium: 1.6 mg/dL — ABNORMAL LOW (ref 1.7–2.4)

## 2024-08-19 MED ORDER — MAGNESIUM SULFATE 2 GM/50ML IV SOLN
2.0000 g | Freq: Once | INTRAVENOUS | Status: AC
  Administered 2024-08-19: 2 g via INTRAVENOUS
  Filled 2024-08-19: qty 50

## 2024-08-19 MED ORDER — HYDROCOD POLI-CHLORPHE POLI ER 10-8 MG/5ML PO SUER
5.0000 mL | Freq: Two times a day (BID) | ORAL | Status: DC | PRN
  Administered 2024-08-19 – 2024-08-25 (×8): 5 mL via ORAL
  Filled 2024-08-19 (×8): qty 115

## 2024-08-19 MED ORDER — DILTIAZEM HCL 60 MG PO TABS
90.0000 mg | ORAL_TABLET | Freq: Four times a day (QID) | ORAL | Status: DC
  Administered 2024-08-19 – 2024-08-25 (×25): 90 mg via ORAL
  Filled 2024-08-19 (×25): qty 1

## 2024-08-19 MED ORDER — HYDRALAZINE HCL 20 MG/ML IJ SOLN
10.0000 mg | Freq: Once | INTRAMUSCULAR | Status: AC
  Administered 2024-08-19: 10 mg via INTRAVENOUS
  Filled 2024-08-19: qty 1

## 2024-08-19 MED ORDER — K PHOS MONO-SOD PHOS DI & MONO 155-852-130 MG PO TABS
500.0000 mg | ORAL_TABLET | Freq: Two times a day (BID) | ORAL | Status: AC
  Administered 2024-08-19 (×2): 500 mg via ORAL
  Filled 2024-08-19 (×2): qty 2

## 2024-08-19 MED ORDER — IPRATROPIUM-ALBUTEROL 0.5-2.5 (3) MG/3ML IN SOLN
3.0000 mL | Freq: Three times a day (TID) | RESPIRATORY_TRACT | Status: DC
  Administered 2024-08-19 – 2024-08-21 (×7): 3 mL via RESPIRATORY_TRACT
  Filled 2024-08-19 (×7): qty 3

## 2024-08-19 MED ORDER — POTASSIUM CHLORIDE CRYS ER 20 MEQ PO TBCR
40.0000 meq | EXTENDED_RELEASE_TABLET | Freq: Two times a day (BID) | ORAL | Status: AC
Start: 2024-08-19 — End: 2024-08-19
  Administered 2024-08-19 (×2): 40 meq via ORAL
  Filled 2024-08-19 (×2): qty 2

## 2024-08-19 NOTE — Progress Notes (Signed)
 PROGRESS NOTE    Lindsey Williamson  FMW:[id number] DOB: 12/01/1951 DOA: 08/16/2024 PCP: [name], MD   Brief Narrative:  Lindsey Williamson is a 73 y.o. female with a hx of RLL cancer, ILD, COPD, DM2, CAD, HTN, HLD, chronic respiratory failure who presents with Hemoptysis and progressive dyspnea since her prior hospitalization.  Found to have a worsening pleural effusion as well and had some sinus tachycardia versus atrial tachycardia.  She received TXA nebs and pulmonary cardiology been consulted.  She has undergone a thoracentesis per pulmonary and cardiology has adjusted her medications.   Assessment and Plan:  Hemoptysis: Improved with TXA nebs and Most likely secondary to underlying malignancy Seen by PCCM/pulmonary; Currently no intervention but if worsens they Recommended to consultation with IR to see if there is a target bronchial artery that could be embolized and ablated. Other option would be discussion with thoracic surgery regarding possible APC or laser via bronchoscopy. Pulmonary feels Standard bronchoscopy would not really offer much in the way of hemostasis, although could treat tumor with epinephrine  for some temporary effect -risk probably not worth the low benefit. Cryotherapy by bronchoscopy would allow some tumor debridement but would probably not decrease frequency of bleeding.  Continuing TXA every 8 hours per pulmonary and is continues to improve  COPD exacerbation / ILD and chronic respiratory failure on 2 L at home: Continue supplemental O2 inhalation, C/w Empiric antibiotics ceftriaxone . IV Solu-Medrol  followed by tapering Prednisone  (Now on 40 mg Prednisone ), DuoNeb every 6 hourly changed to TID and continuing Umeclidinium-Vilanterol 1 puff IH Daily; C/w Xopenex  0.63 mg Neb q4hprn Wheezing; C/w Guaifenesin  600 mg p.o. twice daily. Repeat CXR in the AM   Sinus tachycardia versus atrial tachycardia: Continue to monitor on telemetry Seen by cardiology they feel that  her heart rate is improved with improvement in her pulm status.  They believe this is most likely sinus tachycardia but she may have had a brief atrial tachycardia episode in the ED. Continue bisoprolol ; They started Cardizem  CD 120 mg p.o. daily but now changed to Dilitazem 90 mg q6h. Cardiology recommending consolidating to LA on discharge, Discontinued amlodipine , Follow cardiology for further recommendation   Right Sided Pleural Effusion: Pulmonary pursing Thoracentesis for further evaluation and she had 500 mL off. They note that this is likely related to a neoplastic process however this not been proven cytologically. Awaiting Cytological Studies but this an Exudative Effusion.  Pulmonary feels that it is essential to confirm malignant effusion as cause of nonmalignant effusion may be related to postobstructive pneumonia radiation or chemo changes or other issues such as trapped lung or infection.  They feel that if she does have a malignant effusion that she may benefit from a Pleurx catheter  Metabolic acidosis: 8/21 Bicarb level 12 very low; Bicarbonate 100 mEq every hourly x 2 doses given; Repeat bicarb level 16, another dose of bicarbonate 100 mEq x 1 given.  Now her metabolic acidosis improved and she has a CO2 of 22, anion gap of 13, chloride level of 105   Essential Hypertension: PTA amlodipine  discontinued; C/w Bisoprolol  10 mg po Daily and Cardizem  CD 120 mg p.o. daily being changed to Diltiazem  90 mg po q6h by Cardiology. CTM BP per protocol and titrate medications accordingly. 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 Follows with Dr. Sherrod and currently on  palliative systemic chemotherapy with carboplatin  and paclitaxel  as well as Keytruda .  Will notify Dr. Sherrod of her admission   Electrolyte Abnormalities: K+ is 3.2, Phos is 1.9, and Mag is  1.6. Replete w/ po KCL 40 mEQ BID, po K Phos  500 mg po BID, and IV Mag Sulfate 2 grams. CTM and Trend and repeat CMP, Mag, Phos in the AM.  Normocytic Anemia: Hgb/Hct Trend relatively stable and last check was 9.2/32.2 w/ MCV of 88.5. Checked Anemia Panel and showed iron level of 27, TIBC 194, TIBC 221, saturation of 12%, ferritin 1169, vitamin B12 1832.  CTM for S/Sx of Bleeding; No overt bleeding noted. Repeat CBC in the AM   Uncontrolled Diabetes Mellitus Type 2: Hemoglobin A1c 9.3, elevated; At risk for hyperglycemia due to steroid. C/w NovoLog  sliding scale and Monitor CBG, diabetic diet; CBGs ranging from 177-341   CAD and HLD: Continue statin; Held aspirin  for now due to hemoptysis   Anxiety: C/w Atarax  25 mg p.o. 3 times daily  Overweight: Complicates overall prognosis and care. Estimated body mass index is 27.55 kg/m as calculated from the following:   Height as of this encounter: 5' 4 (1.626 m).   Weight as of this encounter: 72.8 kg. Weight Loss and Dietary Counseling given   DVT prophylaxis: SCDs Start: 08/16/24 2332    Code Status: Full Code Family Communication: No family present @ bedside   Disposition Plan:  Level of care: Progressive Status is: Inpatient Remains inpatient appropriate because: Needs further clinical improvement and clearance by the Specialists   Consultants:  Cardiology Pulmonary  Notified Oncology   Procedures:  Thoracentesis   Antimicrobials:  Anti-infectives (From admission, onward)    Start     Dose/Rate Route Frequency Ordered Stop   08/17/24 1600  cefTRIAXone  (ROCEPHIN ) 1 g in sodium chloride  0.9 % 100 mL IVPB        1 g 200 mL/hr over 30 Minutes Intravenous Every 24 hours 08/16/24 2331 08/22/24 1559   08/16/24 1900  cefTRIAXone  (ROCEPHIN ) 2 g in sodium chloride  0.9 % 100 mL IVPB        2 g 200 mL/hr over 30 Minutes Intravenous  Once 08/16/24 1849 08/16/24 1955   08/16/24 1900  doxycycline  (VIBRAMYCIN ) 100 mg in sodium chloride  0.9 %  250 mL IVPB        100 mg 125 mL/hr over 120 Minutes Intravenous  Once 08/16/24 1849 08/16/24 2218       Subjective: Seen and examined at bedside and was complaining of significant coughing overnight.  No nausea or vomiting.  Feels okay and states that hemoptysis is improved.  Denies any other concerns or complaints at this time.  Objective: Vitals:   08/19/24 1700 08/19/24 1703 08/19/24 1724 08/19/24 1907  BP: 134/63 134/63    Pulse: 86     Resp: 19     Temp:   97.6 F (36.4 C) (!) 97.5 F (36.4 C)  TempSrc:   Oral Axillary  SpO2: 99%     Weight:      Height:        Intake/Output Summary (Last 24 hours) at 08/19/2024 1956 Last data filed at 08/19/2024 1828 Gross per 24 hour  Intake 1816.38 ml  Output 1350 ml  Net 466.38 ml   Filed Weights   08/16/24 1434 08/18/24 0315 08/19/24 0310  Weight: 66.2 kg 69.9 kg 72.8 kg   Examination: Physical Exam:  Constitutional: WN/WD overweight chronically ill-appearing African-American female in no acute distress Respiratory: Diminished to  auscultation bilaterally with coarse breath sounds though she does have some rhonchi and some slight wheezing but no appreciable crackles or rales. Normal respiratory effort and patient is not tachypenic. No accessory muscle use.  Not wearing supplemental oxygen via nasal cannula Cardiovascular: RRR, no murmurs / rubs / gallops. S1 and S2 auscultated.  Minimal extremity edema Abdomen: Soft, non-tender, distended secondary to body habitus. Bowel sounds positive.  GU: Deferred. Musculoskeletal: No clubbing / cyanosis of digits/nails. No joint deformity upper and lower extremities.  Skin: No rashes, lesions, ulcers on limited skin evaluation. No induration; Warm and dry.  Neurologic: CN 2-12 grossly intact with no focal deficits. Romberg sign and cerebellar reflexes not assessed.  Psychiatric: Normal judgment and insight. Alert and oriented x 3. Normal mood and appropriate affect.   Data Reviewed: I have  personally reviewed following labs and imaging studies  CBC: Recent Labs  Lab 08/13/24 0843 08/16/24 1529 08/17/24 0349 08/18/24 0321 08/19/24 0301  WBC 10.9* 12.4* 12.5* 12.9* 11.3*  NEUTROABS 8.6* 10.0*  --   --  8.8*  HGB 10.0* 9.7* 9.0* 8.9* 9.2*  HCT 32.1* 33.6* 30.8* 31.1* 32.2*  MCV 83.4 87.0 88.5 87.9 88.5  PLT 440* 500* 474* 495* 560*   Basic Metabolic Panel: Recent Labs  Lab 08/16/24 1529 08/16/24 2002 08/17/24 0349 08/17/24 1402 08/18/24 0321 08/19/24 0301  NA 139  --  133* 142 140 140  K 3.9  --  4.1 3.8 3.6 3.2*  CL 107  --  102 106 105 105  CO2 17*  --  12* 16* 23 22  GLUCOSE 154*  --  231* 200* 245* 192*  BUN 11  --  12 14 16 11   CREATININE 0.61  --  0.67 0.80 0.78 0.47  CALCIUM  9.6  --  8.8* 9.1 8.6* 8.8*  MG  --  1.5* 2.0  --  1.7 1.6*  PHOS  --  3.0 3.5  --  3.1 1.9*   GFR: Estimated Creatinine Clearance: 60.3 mL/min (by C-G formula based on SCr of 0.47 mg/dL). Liver Function Tests: Recent Labs  Lab 08/13/24 0843 08/16/24 1529 08/17/24 0349 08/18/24 1142 08/19/24 0301  AST 11* 15 11*  --  12*  ALT <5 <5 5  --  6  ALKPHOS 87 82 75  --  67  BILITOT 0.3 0.9 1.2  --  0.3  PROT 7.4 8.2* 7.5 6.8 6.6  ALBUMIN 3.5 2.8* 2.6*  --  2.6*   No results for input(s): LIPASE, AMYLASE in the last 168 hours. No results for input(s): AMMONIA in the last 168 hours. Coagulation Profile: Recent Labs  Lab 08/16/24 1612  INR 1.0   Cardiac Enzymes: Recent Labs  Lab 08/16/24 2002  CKTOTAL 27*   BNP (last 3 results) No results for input(s): PROBNP in the last 8760 hours. HbA1C: Recent Labs    08/17/24 0421  HGBA1C 9.3*   CBG: Recent Labs  Lab 08/19/24 0303 08/19/24 0812 08/19/24 1210 08/19/24 1359 08/19/24 1654  GLUCAP 182* 177* 335* 318* 341*   Lipid Profile: No results for input(s): CHOL, HDL, LDLCALC, TRIG, CHOLHDL, LDLDIRECT in the last 72 hours. Thyroid  Function Tests: No results for input(s): TSH, T4TOTAL,  FREET4, T3FREE, THYROIDAB in the last 72 hours. Anemia Panel: Recent Labs    08/17/24 0421 08/17/24 1402 08/19/24 0301 08/19/24 0725  VITAMINB12  --  1,978*  --  1,832*  FOLATE  --  15.9 8.8  --   FERRITIN  --  198  --  169  TIBC  --  241*  --  221*  IRON  --  19*  --  27*  RETICCTPCT 2.2  --  2.2  --    Sepsis Labs: Recent Labs  Lab 08/16/24 2002  LATICACIDVEN 1.1    Recent Results (from the past 240 hours)  Resp panel by RT-PCR (RSV, Flu A&B, Covid) Anterior Nasal Swab     Status: None   Collection Time: 08/16/24  9:24 PM   Specimen: Anterior Nasal Swab  Result Value Ref Range Status   SARS Coronavirus 2 by RT PCR NEGATIVE NEGATIVE Final    Comment: (NOTE) SARS-CoV-2 target nucleic acids are NOT DETECTED.  The SARS-CoV-2 RNA is generally detectable in upper respiratory specimens during the acute phase of infection. The lowest concentration of SARS-CoV-2 viral copies this assay can detect is 138 copies/mL. A negative result does not preclude SARS-Cov-2 infection and should not be used as the sole basis for treatment or other patient management decisions. A negative result may occur with  improper specimen collection/handling, submission of specimen other than nasopharyngeal swab, presence of viral mutation(s) within the areas targeted by this assay, and inadequate number of viral copies(<138 copies/mL). A negative result must be combined with clinical observations, patient history, and epidemiological information. The expected result is Negative.  Fact Sheet for Patients:  BloggerCourse.com  Fact Sheet for Healthcare Providers:  SeriousBroker.it  This test is no t yet approved or cleared by the [place]  FDA and  has been authorized for detection and/or diagnosis of SARS-CoV-2 by FDA under an Emergency Use Authorization (EUA). This EUA will remain  in effect (meaning this test can be used) for the  duration of the COVID-19 declaration under Section 564(b)(1) of the Act, 21 U.S.C.section 360bbb-3(b)(1), unless the authorization is terminated  or revoked sooner.       Influenza A by PCR NEGATIVE NEGATIVE Final   Influenza B by PCR NEGATIVE NEGATIVE Final    Comment: (NOTE) The Xpert Xpress SARS-CoV-2/FLU/RSV plus assay is intended as an aid in the diagnosis of influenza from Nasopharyngeal swab specimens and should not be used as a sole basis for treatment. Nasal washings and aspirates are unacceptable for Xpert Xpress SARS-CoV-2/FLU/RSV testing.  Fact Sheet for Patients: BloggerCourse.com  Fact Sheet for Healthcare Providers: SeriousBroker.it  This test is not yet approved or cleared by the [place]  FDA and has been authorized for detection and/or diagnosis of SARS-CoV-2 by FDA under an Emergency Use Authorization (EUA). This EUA will remain in effect (meaning this test can be used) for the duration of the COVID-19 declaration under Section 564(b)(1) of the Act, 21 U.S.C. section 360bbb-3(b)(1), unless the authorization is terminated or revoked.     Resp Syncytial Virus by PCR NEGATIVE NEGATIVE Final    Comment: (NOTE) Fact Sheet for Patients: BloggerCourse.com  Fact Sheet for Healthcare Providers: SeriousBroker.it  This test is not yet approved or cleared by the [place]  FDA and has been authorized for detection and/or diagnosis of SARS-CoV-2 by FDA under an Emergency Use Authorization (EUA). This EUA will remain in effect (meaning this test can be used) for the duration of the COVID-19 declaration under Section 564(b)(1) of the Act, 21 U.S.C. section 360bbb-3(b)(1), unless the authorization is terminated or revoked.  Performed at [facility], [address] [place], [place] [address]   MRSA Next Gen by PCR, Nasal     Status: None    Collection Time: 08/17/24  2:05 PM   Specimen: Nasal  Mucosa; Nasal Swab  Result Value Ref Range Status   MRSA by PCR Next Gen NOT DETECTED NOT DETECTED Final    Comment: (NOTE) The GeneXpert MRSA Assay (FDA approved for NASAL specimens only), is one component of a comprehensive MRSA colonization surveillance program. It is not intended to diagnose MRSA infection nor to guide or monitor treatment for MRSA infections. Test performance is not FDA approved in patients less than 68 years old. Performed at [facility], [address] [place], [place] [address]   Body fluid culture w Gram Stain     Status: None (Preliminary result)   Collection Time: 08/18/24 11:13 AM   Specimen: Pleural Fluid  Result Value Ref Range Status   Specimen Description   Final    PLEURAL Performed at [facility], [address], [place], [place] [address]    Special Requests   Final    NONE Performed at [facility], [address] [place], [place] [address]    Gram Stain   Final    WBC PRESENT,BOTH PMN AND MONONUCLEAR RED BLOOD CELLS PRESENT NO ORGANISMS SEEN    Culture   Final    NO GROWTH < 24 HOURS Performed at [facility], [address] [place], [place] [address]    Report Status PENDING  Incomplete    Radiology Studies: DG CHEST PORT 1 VIEW Result Date: 08/19/2024 CLINICAL DATA:  Shortness of breath. EXAM: PORTABLE CHEST 1 VIEW COMPARISON:  08/18/2024. FINDINGS: The cardio pericardial silhouette is enlarged. Right base collapse/consolidation with moderate right effusion is similar. Background diffuse interstitial opacity is similar. No left pleural effusion. Bones are diffusely demineralized. Telemetry leads overlie the chest. IMPRESSION: No substantial interval change. Right base collapse/consolidation with moderate right effusion. Electronically Signed   By: Camellia Candle M.D.   On: 08/19/2024 09:17   DG CHEST PORT 1 VIEW Result  Date: 08/18/2024 CLINICAL DATA:  Pleural effusion.  Lung carcinoma.  Thoracentesis. EXAM: PORTABLE CHEST 1 VIEW COMPARISON:  05/18/2024 FINDINGS: Moderate right pleural effusion shows increase in size since prior exam. Increased right lower lung atelectasis. No pneumothorax visualized. Chronic interstitial lung disease again noted. Stable heart size. IMPRESSION: Increased size of moderate right pleural effusion and right lower lung atelectasis. No pneumothorax visualized. Chronic interstitial lung disease. Electronically Signed   By: Norleen DELENA Kil M.D.   On: 08/18/2024 11:57   Scheduled Meds:  bisoprolol   10 mg Oral Daily   Chlorhexidine  Gluconate Cloth  6 each Topical Q0600   diltiazem   90 mg Oral Q6H   feeding supplement (GLUCERNA SHAKE)  237 mL Oral TID BM   guaiFENesin   600 mg Oral BID   hydrOXYzine   25 mg Oral TID   insulin  aspart  0-9 Units Subcutaneous Q4H   ipratropium-albuterol   3 mL Nebulization TID   multivitamin with minerals  1 tablet Oral Daily   pantoprazole   40 mg Oral Daily   phosphorus  500 mg Oral BID   potassium chloride   40 mEq Oral BID   pravastatin   80 mg Oral QHS   predniSONE   40 mg Oral Q breakfast   tranexamic acid   500 mg Nebulization Q8H   umeclidinium-vilanterol  1 puff Inhalation Daily   Continuous Infusions:  cefTRIAXone  (ROCEPHIN )  IV Stopped (08/19/24 1741)    LOS: 3 days   Alejandro Marker, DO [facility] Available via Epic secure chat 7am-7pm After these hours, please refer to coverage provider listed on [email or web address] 08/19/2024, 7:56 PM

## 2024-08-19 NOTE — Plan of Care (Signed)
  Problem: Coping: Goal: Ability to adjust to condition or change in health will improve Outcome: Progressing   Problem: Fluid Volume: Goal: Ability to maintain a balanced intake and output will improve Outcome: Progressing   Problem: Skin Integrity: Goal: Risk for impaired skin integrity will decrease Outcome: Progressing   Problem: Tissue Perfusion: Goal: Adequacy of tissue perfusion will improve Outcome: Progressing   Problem: Respiratory: Goal: Ability to maintain a clear airway will improve Outcome: Progressing Goal: Ability to maintain adequate ventilation will improve Outcome: Progressing   Problem: Education: Goal: Knowledge of General Education information will improve Description: Including pain rating scale, medication(s)/side effects and non-pharmacologic comfort measures Outcome: Progressing   Problem: Nutrition: Goal: Adequate nutrition will be maintained Outcome: Progressing

## 2024-08-19 NOTE — Progress Notes (Signed)
 NAME:  Lindsey Williamson, MRN:  [id number], DOB:  15-Mar-1951, LOS: 3 ADMISSION DATE:  08/16/2024, CONSULTATION DATE: 08/17/2024 REFERRING MD: Dr. 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  Followed in our office by Dr Neda, chronic hypoxemic respiratory failure on continuous O2.  Repeat bronchoscopy 05/19/2024 for hemoptysis showed an endobronchial lesion in the bronchus intermedius extending to the right lower lobe with associated bleeding.  Followed conservatively. She underwent chemoradiation with right upper lobe XRT, and then Imfinzi  complicated by associated pneumonitis June 2025 treated with steroids.  Most recent imaging included PET scan 07/16/2024 that showed progressive disease in the right lower lobe with associated complete atelectasis, contralateral left lower lobe metastasis (versus primary), progressive hilar and mediastinal adenopathy.  Current therapy is carboplatin /Paclitaxol + Keytruda .  Has continued to have cough, tried Hydromet with only partial success.  Her treatment was delayed and she was treated for possible bronchitis with levofloxacin  08/13/2024. Admitted now with progressive dyspnea, increased hemoptysis since the May hospitalization and bronchoscopy.  In the emergency department she received TXA nebs. She describes bloody mucous, at it's heaviest about 1/2 a cup on day prior to presentation. Usually bright red, sometimes dark spotty clots. Last time she saw any was this am - scant dark blood (after the TXA).    Pertinent  Medical History   Past Medical History:  Diagnosis Date   Angioedema 10/17/2023   Arthritis    Diabetes mellitus without complication (HCC)    Hypertension    Significant Hospital Events: Including  procedures, antibiotic start and stop dates in addition to other pertinent events   8/21 CT-PA >> no pulmonary embolism, extensive subcarinal and right hilar adenopathy that appears to extend and occlude the right lower lobe bronchus proximally.  3.7 cm left lower lobe mass.  Moderate right pleural effusion with associated right upper lobe atelectasis 8/24 thoracentesis for 500 amber-colored fluid, exudative  Interim History / Subjective:  Uneventful night Cough, not bringing up any blood rated  Objective    Blood pressure (!) 153/75, pulse 93, temperature 97.7 F (36.5 C), temperature source Oral, resp. rate (!) 31, height 5' 4 (1.626 m), weight 72.8 kg, SpO2 96%.        Intake/Output Summary (Last 24 hours) at 08/19/2024 9092 Last data filed at 08/19/2024 9366 Gross per 24 hour  Intake 2176.15 ml  Output 2150 ml  Net 26.15 ml   Filed Weights   08/16/24 1434 08/18/24 0315 08/19/24 0310  Weight: 66.2 kg 69.9 kg 72.8 kg    Examination: General: Chronically ill-appearing, comfortable, on oxygen supplementation HENT: Moist oral mucosa Lungs: Bilateral rhonchi, decreased air movement on the right Cardiovascular: S1-S2 appreciated Abdomen: Soft, bowel sounds appreciated Extremities: No clubbing, no edema Neuro: Awake alert moving extremities appropriately GU:   I reviewed last 24 h vitals and pain scores, last 48 h intake and output, last 24 h labs and trends, and last 24 h imaging results. 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 Gatha - On palliative systemic chemotherapy with carboplatin  and paclitaxel , Keytruda   Large right pleural effusion Postthoracentesis Exudative effusion -  500 cc of fluid was removed, cytology  pending -Essential to confirm malignant effusion as causes of nonmalignant effusion may be related to postobstructive pneumonia, radiation or chemo changes, trapped lung or infection -May benefit from Pleurx catheter placement if effusion found to be malignant  Hemoptysis Improved with tranexamic acid  -On tranexamic acid  every 8  Chronic obstructive pulmonary disease/emphysema Chronic respiratory failure - Continue oxygen supplementation Continue bronchodilator treatment  Diabetes, hypertension, hyperlipidemia  Options for management of the hemoptysis may have to include bronchial artery embolization if she continues to bleed significantly despite ongoing treatment  Flexible bronchoscopy will likely not be of any benefit as we already know she does have endobronchial involvement  Best Practice (right click and Reselect all SmartList Selections daily)   Per Primary service Labs   CBC: Recent Labs  Lab 08/13/24 0843 08/16/24 1529 08/17/24 0349 08/18/24 0321 08/19/24 0301  WBC 10.9* 12.4* 12.5* 12.9* 11.3*  NEUTROABS 8.6* 10.0*  --   --  8.8*  HGB 10.0* 9.7* 9.0* 8.9* 9.2*  HCT 32.1* 33.6* 30.8* 31.1* 32.2*  MCV 83.4 87.0 88.5 87.9 88.5  PLT 440* 500* 474* 495* 560*    Basic Metabolic Panel: Recent Labs  Lab 08/16/24 1529 08/16/24 2002 08/17/24 0349 08/17/24 1402 08/18/24 0321 08/19/24 0301  NA 139  --  133* 142 140 140  K 3.9  --  4.1 3.8 3.6 3.2*  CL 107  --  102 106 105 105  CO2 17*  --  12* 16* 23 22  GLUCOSE 154*  --  231* 200* 245* 192*  BUN 11  --  12 14 16 11   CREATININE 0.61  --  0.67 0.80 0.78 0.47  CALCIUM  9.6  --  8.8* 9.1 8.6* 8.8*  MG  --  1.5* 2.0  --  1.7 1.6*  PHOS  --  3.0 3.5  --  3.1 1.9*   GFR: Estimated Creatinine Clearance: 60.3 mL/min (by C-G formula based on SCr of 0.47 mg/dL). Recent Labs  Lab 08/16/24 1529 08/16/24 2002 08/17/24 0349 08/18/24 0321 08/19/24 0301  WBC 12.4*  --  12.5* 12.9* 11.3*  LATICACIDVEN  --  1.1  --    --   --     Liver Function Tests: Recent Labs  Lab 08/13/24 0843 08/16/24 1529 08/17/24 0349 08/18/24 1142 08/19/24 0301  AST 11* 15 11*  --  12*  ALT <5 <5 5  --  6  ALKPHOS 87 82 75  --  67  BILITOT 0.3 0.9 1.2  --  0.3  PROT 7.4 8.2* 7.5 6.8 6.6  ALBUMIN 3.5 2.8* 2.6*  --  2.6*   No results for input(s): LIPASE, AMYLASE in the last 168 hours. No results for input(s): AMMONIA in the last 168 hours.  ABG    Component Value Date/Time   HCO3 19.8 (L) 08/16/2024 1612   ACIDBASEDEF 4.0 (H) 08/16/2024 1612   O2SAT 36.5 08/16/2024 1612     Coagulation Profile: Recent Labs  Lab 08/16/24 1612  INR 1.0    Cardiac Enzymes: Recent Labs  Lab 08/16/24 2002  CKTOTAL 27*    HbA1C: Hgb A1c MFr Bld  Date/Time Value Ref Range Status  08/17/2024 04:21 AM 9.3 (H) 4.8 - 5.6 % Final    Comment:    (NOTE) Diagnosis of Diabetes The following HbA1c ranges recommended by the American Diabetes Association (ADA) may be used as an aid in the diagnosis of diabetes mellitus.  Hemoglobin  Suggested A1C NGSP%              Diagnosis  <5.7                   Non Diabetic  5.7-6.4                Pre-Diabetic  >6.4                   Diabetic  <7.0                   Glycemic control for                       adults with diabetes.    02/16/2024 05:59 AM 7.4 (H) 4.8 - 5.6 % Final    Comment:    (NOTE) Pre diabetes:          5.7%-6.4%  Diabetes:              >6.4%  Glycemic control for   <7.0% adults with diabetes     CBG: Recent Labs  Lab 08/18/24 1622 08/18/24 1933 08/18/24 2317 08/19/24 0303 08/19/24 0812  GLUCAP 302* 345* 224* 182* 177*    Review of Systems:   Shortness of breath, cough  Past Medical History:  She,  has a past medical history of Angioedema (10/17/2023), Arthritis, Diabetes mellitus without complication (HCC), and Hypertension.   Surgical History:   Past Surgical History:  Procedure Laterality Date   BRONCHIAL BIOPSY   03/30/2022   Procedure: BRONCHIAL BIOPSIES;  Surgeon: Brenna Adine CROME, DO;  Location: [facility] ENDOSCOPY;  Service: Pulmonary;;   BRONCHIAL BIOPSY  10/17/2023   Procedure: BRONCHIAL BIOPSIES;  Surgeon: Shelah Lamar RAMAN, MD;  Location: [facility] ENDOSCOPY;  Service: Pulmonary;;   BRONCHIAL BRUSHINGS  03/30/2022   Procedure: BRONCHIAL BRUSHINGS;  Surgeon: Brenna Adine CROME, DO;  Location: [facility] ENDOSCOPY;  Service: Pulmonary;;   BRONCHIAL BRUSHINGS  10/17/2023   Procedure: BRONCHIAL BRUSHINGS;  Surgeon: Shelah Lamar RAMAN, MD;  Location: [facility] ENDOSCOPY;  Service: Pulmonary;;   BRONCHIAL NEEDLE ASPIRATION BIOPSY  03/30/2022   Procedure: BRONCHIAL NEEDLE ASPIRATION BIOPSIES;  Surgeon: Brenna Adine CROME, DO;  Location: [facility] ENDOSCOPY;  Service: Pulmonary;;   BRONCHIAL NEEDLE ASPIRATION BIOPSY  10/17/2023   Procedure: BRONCHIAL NEEDLE ASPIRATION BIOPSIES;  Surgeon: Shelah Lamar RAMAN, MD;  Location: [facility] ENDOSCOPY;  Service: Pulmonary;;   BRONCHIAL WASHINGS  05/19/2024   Procedure: IRRIGATION, BRONCHUS;  Surgeon: Jude Harden GAILS, MD;  Location: [facility] ENDOSCOPY;  Service: Cardiopulmonary;;   EYE SURGERY     FIDUCIAL MARKER PLACEMENT  03/30/2022   Procedure: FIDUCIAL MARKER PLACEMENT;  Surgeon: Brenna Adine CROME, DO;  Location: [facility] ENDOSCOPY;  Service: Pulmonary;;   FINE NEEDLE ASPIRATION  10/17/2023   Procedure: FINE NEEDLE ASPIRATION;  Surgeon: Shelah Lamar RAMAN, MD;  Location[facility]OSCOPY;  Service: Pulmonary;;   FOREIGN BODY REMOVAL  03/30/2022   Procedure: FOREIGN BODY REMOVAL;  Surgeon: Brenna Adine CROME, DO;  Location: [facility] ENDOSCOPY;  Service: Pulmonary;;   HEMOSTASIS CONTROL  10/17/2023   Procedure: HEMOSTASIS CONTROL;  Surgeon: Shelah Lamar RAMAN, MD;  Location: [facility] ENDOSCOPY;  Service: Pulmonary;;   VIDEO BRONCHOSCOPY Bilateral 05/19/2024   Procedure: VIDEO BRONCHOSCOPY WITHOUT FLUORO;  Surgeon: Jude Harden GAILS, MD;  Location: [facility] ENDOSCOPY;  Service: Cardiopulmonary;  Laterality: Bilateral;   VIDEO BRONCHOSCOPY WITH ENDOBRONCHIAL ULTRASOUND  Bilateral 03/30/2022   Procedure: VIDEO BRONCHOSCOPY WITH ENDOBRONCHIAL ULTRASOUND;  Surgeon: Brenna Adine CROME, DO;  Location: [facility]  ENDOSCOPY;  Service: Pulmonary;  Laterality: Bilateral;   VIDEO BRONCHOSCOPY WITH ENDOBRONCHIAL ULTRASOUND Right 10/17/2023   Procedure: VIDEO BRONCHOSCOPY WITH ENDOBRONCHIAL ULTRASOUND;  Surgeon: Shelah Lamar RAMAN, MD;  Location: [facility] ENDOSCOPY;  Service: Pulmonary;  Laterality: Right;   VIDEO BRONCHOSCOPY WITH RADIAL ENDOBRONCHIAL ULTRASOUND  03/30/2022   Procedure: VIDEO BRONCHOSCOPY WITH RADIAL ENDOBRONCHIAL ULTRASOUND;  Surgeon: Brenna Adine CROME, DO;  Location: [facility] ENDOSCOPY;  Service: Pulmonary;;     Social History:   reports that she quit smoking about 19 months ago. Her smoking use included cigarettes. She has been exposed to tobacco smoke. She has never used smokeless tobacco. She reports that she does not drink alcohol and does not use drugs.   Family History:  Her family history includes Allergic rhinitis in her sister; Asthma in her sister. There is no history of Angioedema, Atopy, Immunodeficiency, Urticaria, or Eczema.   Allergies Allergies  Allergen Reactions   Benzonatate Palpitations   Lisinopril Swelling and Other (See Comments)    Patient ended up in the ED with a swollen mouth and tongue   Aspirin  Nausea And Vomiting and Palpitations    Can tolerate baby aspirin  81 mg without difficulty but can't tolerate higher dosages   Azithromycin  Palpitations   Codeine  Palpitations    Patient states that this is not an allergy     Jennet Epley, MD [place] [facility] Pager: See Tracey

## 2024-08-19 NOTE — Evaluation (Signed)
 Physical Therapy Evaluation Patient Details Name: Lindsey Williamson MRN: [id number] DOB: 1951-08-04 Today's Date: 08/19/2024  History of Present Illness  Pt admitted from home 2* hemoptysis, COPD exacerbation, metabolic acidosis.  Pt s/p thoracentesis 08/18/24.  Pt with hx of DM, htn, angioedema, COPD on 2L home O2, CAD s/p CABG, and stg 3 Lung CA  Clinical Impression  Pt admitted as above and presenting with functional mobility limitations 2* generalized weakness, ambulatory balance deficits, and decreased activity tolerance. Pt should progress to dc home with family assist and would like to continue with HHPT (was on caseload prior to admit per pt).        If plan is discharge home, recommend the following: A little help with walking and/or transfers;A little help with bathing/dressing/bathroom;Assistance with cooking/housework;Assist for transportation;Help with stairs or ramp for entrance   Can travel by private vehicle        Equipment Recommendations None recommended by PT  Recommendations for Other Services       Functional Status Assessment Patient has had a recent decline in their functional status and demonstrates the ability to make significant improvements in function in a reasonable and predictable amount of time.     Precautions / Restrictions Precautions Precautions: Fall Restrictions Weight Bearing Restrictions Per Provider Order: No      Mobility  Bed Mobility Overal bed mobility: Needs Assistance Bed Mobility: Supine to Sit     Supine to sit: Contact guard, Used rails     General bed mobility comments: for safety    Transfers Overall transfer level: Needs assistance Equipment used: Rolling walker (2 wheels) Transfers: Sit to/from Stand Sit to Stand: Min assist           General transfer comment: Steady assist with cues for use of UEs to self assist    Ambulation/Gait Ambulation/Gait assistance: Min assist, +2 safety/equipment Gait Distance  (Feet): 34 Feet Assistive device: Rolling walker (2 wheels) Gait Pattern/deviations: Step-to pattern, Step-through pattern, Decreased step length - right, Decreased step length - left, Shuffle, Trunk flexed Gait velocity: decr     General Gait Details: Increased time, standing rest breaks, cues for posture and position from [employer]            Wheelchair Mobility     Tilt Bed    Modified Rankin (Stroke Patients Only)       Balance Overall balance assessment: Needs assistance Sitting-balance support: No upper extremity supported, Feet supported Sitting balance-Leahy Scale: Fair     Standing balance support: Bilateral upper extremity supported Standing balance-Leahy Scale: Poor                               Pertinent Vitals/Pain Pain Assessment Pain Assessment: No/denies pain    Home Living Family/patient expects to be discharged to:: Private residence Living Arrangements: Spouse/significant other Available Help at Discharge: Family;Available 24 hours/day Type of Home: Apartment Home Access: Stairs to enter Entrance Stairs-Rails: Right Entrance Stairs-Number of Steps: 15   Home Layout: One level Home Equipment: Agricultural consultant (2 wheels);Rollator (4 wheels)      Prior Function Prior Level of Function : Independent/Modified Independent             Mobility Comments: Has walkers at home but tends to not use       Extremity/Trunk Assessment   Upper Extremity Assessment Upper Extremity Assessment: Generalized weakness    Lower Extremity Assessment Lower Extremity Assessment: Generalized weakness  Communication   Communication Communication: No apparent difficulties    Cognition Arousal: Alert Behavior During Therapy: WFL for tasks assessed/performed   PT - Cognitive impairments: No apparent impairments                         Following commands: Intact       Cueing Cueing Techniques: Verbal cues      General Comments      Exercises     Assessment/Plan    PT Assessment Patient needs continued PT services  PT Problem List Decreased strength;Decreased range of motion;Decreased activity tolerance;Decreased balance;Decreased mobility;Decreased knowledge of use of DME;Pain       PT Treatment Interventions DME instruction;Gait training;Stair training;Functional mobility training;Therapeutic activities;Therapeutic exercise;Patient/family education    PT Goals (Current goals can be found in the Care Plan section)  Acute Rehab PT Goals Patient Stated Goal: Regain IND PT Goal Formulation: With patient Time For Goal Achievement: 09/02/24 Potential to Achieve Goals: Good    Frequency Min 2X/week     Co-evaluation               AM-PAC PT 6 Clicks Mobility  Outcome Measure Help needed turning from your back to your side while in a flat bed without using bedrails?: A Little Help needed moving from lying on your back to sitting on the side of a flat bed without using bedrails?: A Little Help needed moving to and from a bed to a chair (including a wheelchair)?: A Little Help needed standing up from a chair using your arms (e.g., wheelchair or bedside chair)?: A Little Help needed to walk in hospital room?: A Little Help needed climbing 3-5 steps with a railing? : A Lot 6 Click Score: 17    End of Session Equipment Utilized During Treatment: Gait belt Activity Tolerance: Patient tolerated treatment well Patient left: in chair;with call bell/phone within reach;with chair alarm set;with nursing/sitter in room Nurse Communication: Mobility status PT Visit Diagnosis: Difficulty in walking, not elsewhere classified (R26.2)    Time: 8856-8792 PT Time Calculation (min) (ACUTE ONLY): 24 min   Charges:   PT Evaluation $PT Eval Low Complexity: 1 Low PT Treatments $Gait Training: 8-22 mins PT General Charges $$ ACUTE PT VISIT: 1 Visit         [facility] PT Acute  Rehabilitation Services Office [phone]   Sherrilynn Gudgel 08/19/2024, 1:13 PM

## 2024-08-19 NOTE — Evaluation (Signed)
 Occupational Therapy Evaluation Patient Details Name: Lindsey Williamson MRN: [id number] DOB: October 29, 1951 Today's Date: 08/19/2024   History of Present Illness   Pt admitted from home with COPD exacerbation, metabolic acidosis.  Pt s/p thoracentesis 08/18/24.  PMH: DM, HTN, angioedema, COPD on 2L home O2, CAD s/p CABG, and stage 3 Lung CA     Clinical Impressions The pt is currently presenting slightly below her baseline level of functioning for self-care management. She is limited by the below listed deficits (see OT problem list). During the session, she required CGA for tasks, including sit to stand, performing a step-pivot transfer to the bedside commode, and CGA for toileting at bedside commode level. She reported feeling winded with activity, as well as feeling weaker than typical. She will benefit from further OT services to maximize her independence with self-care tasks and to decrease the risk for restricted participation in meaningful activities. Home health OT is recommended.      If plan is discharge home, recommend the following:   Assistance with cooking/housework;Help with stairs or ramp for entrance;Assist for transportation     Functional Status Assessment   Patient has had a recent decline in their functional status and demonstrates the ability to make significant improvements in function in a reasonable and predictable amount of time.     Equipment Recommendations   None recommended by OT     Recommendations for Other Services         Precautions/Restrictions   Restrictions Weight Bearing Restrictions Per Provider Order: No Other Position/Activity Restrictions: O2 use at baseline     Mobility Bed Mobility Overal bed mobility: Needs Assistance Bed Mobility: Sit to Supine     Supine to sit: Supervision, HOB elevated          Transfers Overall transfer level: Needs assistance Equipment used: None Transfers: Sit to/from Stand Sit to Stand:  Contact guard assist                  Balance     Sitting balance-Leahy Scale: Good       Standing balance-Leahy Scale:  (CGA)         ADL either performed or assessed with clinical judgement   ADL Overall ADL's : Needs assistance/impaired Eating/Feeding: Independent;Sitting   Grooming: Set up;Sitting           Upper Body Dressing : Set up;Sitting   Lower Body Dressing: Minimal assistance;Sit to/from stand   Toilet Transfer: Contact guard assist;Stand-pivot;BSC/3in1   Toileting- Clothing Manipulation and Hygiene: Contact guard assist;Sit to/from stand Toileting - Clothing Manipulation Details (indicate cue type and reason): Toileting management performed at bedside commode level.              Pertinent Vitals/Pain Pain Assessment Pain Assessment: No/denies pain     Extremity/Trunk Assessment Upper Extremity Assessment Upper Extremity Assessment: RUE deficits/detail;LUE deficits/detail;Overall WFL for tasks assessed;Right hand dominant   Lower Extremity Assessment Lower Extremity Assessment: RLE deficits/detail;LLE deficits/detail RLE Deficits / Details: AROM WFL LLE Deficits / Details: AROM WFL       Communication Communication Communication: No apparent difficulties   Cognition Arousal: Alert Behavior During Therapy: WFL for tasks assessed/performed               OT - Cognition Comments: Oriented x4                 Following commands: Intact       Cueing  General Comments   Cueing Techniques: Verbal cues  Home Living Family/patient expects to be discharged to:: Private residence Living Arrangements: Spouse/significant other Available Help at Discharge: Family Type of Home: Apartment Home Access: Stairs to enter Secretary/administrator of Steps: 15 Entrance Stairs-Rails: Right Home Layout: One level               Home Equipment: Pharmacist, hospital (2 wheels);Rollator (4 wheels)    Additional Comments: The pt has 5 children, 2 of whom are local and able to assist as needed.      Prior Functioning/Environment Prior Level of Function : Independent/Modified Independent             Mobility Comments: She did not use an assistive device inside the home. ADLs Comments: She was modified independent to independent with ADLs, except her spouse assisted her into and out of the tub. Her sons assisted with cooking and cleaning. She does not drive. She used 2L O2 at all times.    OT Problem List: Decreased strength;Decreased activity tolerance;Cardiopulmonary status limiting activity   OT Treatment/Interventions: Self-care/ADL training;Therapeutic exercise;Therapeutic activities;Energy conservation;DME and/or AE instruction;Patient/family education      OT Goals(Current goals can be found in the care plan section)   Acute Rehab OT Goals Patient Stated Goal: to be as independent as possible OT Goal Formulation: With patient Time For Goal Achievement: 09/02/24 Potential to Achieve Goals: Good ADL Goals Pt Will Perform Grooming: with modified independence;standing Pt Will Perform Lower Body Dressing: with modified independence;sitting/lateral leans;sit to/from stand Pt Will Transfer to Toilet: with modified independence;ambulating Pt Will Perform Toileting - Clothing Manipulation and hygiene: with modified independence;sit to/from stand Additional ADL Goal #1: The pt will independently verbalize and/or implement at least 3 energy conservation strategies to implement as needed during self-care tasks, to facilitate improved overall activity tolerance.   OT Frequency:  Min 2X/week       AM-PAC OT 6 Clicks Daily Activity     Outcome Measure Help from another person eating meals?: None Help from another person taking care of personal grooming?: A Little Help from another person toileting, which includes using toliet, bedpan, or urinal?: A Little Help from another  person bathing (including washing, rinsing, drying)?: A Little Help from another person to put on and taking off regular upper body clothing?: None Help from another person to put on and taking off regular lower body clothing?: A Little 6 Click Score: 20   End of Session Equipment Utilized During Treatment: Oxygen Nurse Communication: Mobility status  Activity Tolerance: Other (comment) (Fair+ tolerance) Patient left: in bed;with call bell/phone within reach;with bed alarm set  OT Visit Diagnosis: Muscle weakness (generalized) (M62.81)                Time: 8557-8491 OT Time Calculation (min): 26 min Charges:  OT General Charges $OT Visit: 1 Visit OT Evaluation $OT Eval Moderate Complexity: 1 Mod OT Treatments $Self Care/Home Management : 8-22 mins    Delanna JINNY Lesches, OTR/L 08/19/2024, 4:01 PM

## 2024-08-19 NOTE — Progress Notes (Signed)
 Subjective:   Bad coughing spells post IR right thoracentesis ?  Objective:  Vitals:   08/19/24 0505 08/19/24 0600 08/19/24 0700 08/19/24 0800  BP:  (!) 164/75 (!) 171/77 (!) 153/75  Pulse:  91 92 93  Resp:  (!) 22 (!) 23 (!) 31  Temp:      TempSrc:      SpO2: 96% 98% 97% 96%  Weight:      Height:        Intake/Output from previous day:  Intake/Output Summary (Last 24 hours) at 08/19/2024 0813 Last data filed at 08/19/2024 9366 Gross per 24 hour  Intake 2176.15 ml  Output 2150 ml  Net 26.15 ml    Physical Exam:  Chronically ill black female Cushingoid Rhonchi/wheezing bilateral decreased BS right base No murmur Abdomen benign Trace edema  Lab Results: Basic Metabolic Panel: Recent Labs    08/18/24 0321 08/19/24 0301  NA 140 140  K 3.6 3.2*  CL 105 105  CO2 23 22  GLUCOSE 245* 192*  BUN 16 11  CREATININE 0.78 0.47  CALCIUM  8.6* 8.8*  MG 1.7 1.6*  PHOS 3.1 1.9*   Liver Function Tests: Recent Labs    08/17/24 0349 08/18/24 1142 08/19/24 0301  AST 11*  --  12*  ALT 5  --  6  ALKPHOS 75  --  67  BILITOT 1.2  --  0.3  PROT 7.5 6.8 6.6  ALBUMIN 2.6*  --  2.6*   No results for input(s): LIPASE, AMYLASE in the last 72 hours. CBC: Recent Labs    08/16/24 1529 08/17/24 0349 08/18/24 0321 08/19/24 0301  WBC 12.4*   < > 12.9* 11.3*  NEUTROABS 10.0*  --   --  8.8*  HGB 9.7*   < > 8.9* 9.2*  HCT 33.6*   < > 31.1* 32.2*  MCV 87.0   < > 87.9 88.5  PLT 500*   < > 495* 560*   < > = values in this interval not displayed.   Cardiac Enzymes: Recent Labs    08/16/24 2002  CKTOTAL 27*   BNP: Invalid input(s): POCBNP D-Dimer: No results for input(s): DDIMER in the last 72 hours. Hemoglobin A1C: Recent Labs    08/17/24 0421  HGBA1C 9.3*    Anemia Panel: Recent Labs    08/17/24 1402 08/19/24 0301  VITAMINB12 1,978*  --   FOLATE 15.9 8.8  FERRITIN 198  --   TIBC 241*  --   IRON 19*  --   RETICCTPCT  --  2.2     Imaging: DG CHEST PORT 1 VIEW Result Date: 08/18/2024 CLINICAL DATA:  Pleural effusion.  Lung carcinoma.  Thoracentesis. EXAM: PORTABLE CHEST 1 VIEW COMPARISON:  05/18/2024 FINDINGS: Moderate right pleural effusion shows increase in size since prior exam. Increased right lower lung atelectasis. No pneumothorax visualized. Chronic interstitial lung disease again noted. Stable heart size. IMPRESSION: Increased size of moderate right pleural effusion and right lower lung atelectasis. No pneumothorax visualized. Chronic interstitial lung disease. Electronically Signed   By: Norleen DELENA Kil M.D.   On: 08/18/2024 11:57    Cardiac Studies:  ECG:  ST    Telemetry: SR rates 80-90  Echo: 02/15/24 EF 65-70%  Medications:    bisoprolol   10 mg Oral Daily   Chlorhexidine  Gluconate Cloth  6 each Topical Q0600   diltiazem   60 mg Oral Q8H   feeding supplement (GLUCERNA SHAKE)  237 mL Oral TID BM   guaiFENesin   600 mg Oral  BID   hydrOXYzine   25 mg Oral TID   insulin  aspart  0-9 Units Subcutaneous Q4H   ipratropium-albuterol   3 mL Nebulization QID   multivitamin with minerals  1 tablet Oral Daily   pantoprazole   40 mg Oral Daily   pravastatin   80 mg Oral QHS   predniSONE   40 mg Oral Q breakfast   tranexamic acid   500 mg Nebulization Q8H   umeclidinium-vilanterol  1 puff Inhalation Daily      sodium chloride  100 mL/hr at 08/19/24 0353   cefTRIAXone  (ROCEPHIN )  IV Stopped (08/18/24 1615)    Assessment/Plan:   Tachycardia:  improved with improvement in pulmonary status. Mostly Sinus Tachycarcia maybe brief episode atrial tachycardia in ER. No PAF/.  Continue bisoprolol  10 mg and cardizem  Can consolidate to LA on d/c.  Pulmonary: stage 4 lung cancer. Seen by Dr Shelah. On rocephin  ? Pneumonia. Considering Pleur X cathter for malignant right effusion for symptomatic relief. Hemoptysis from endobronchial lesion not clear what best approach would be Hct stable 32.2 Post right IR thoracentesis per patient  Pleur X not done f/u CXR this am pending  HTN:  BP up from dyspnea and coughing will increase cardizem    Maude Emmer 08/19/2024, 8:13 AM

## 2024-08-20 ENCOUNTER — Telehealth: Payer: Self-pay | Admitting: Cardiology

## 2024-08-20 ENCOUNTER — Inpatient Hospital Stay (HOSPITAL_COMMUNITY)

## 2024-08-20 ENCOUNTER — Encounter: Payer: Self-pay | Admitting: Internal Medicine

## 2024-08-20 ENCOUNTER — Inpatient Hospital Stay

## 2024-08-20 ENCOUNTER — Other Ambulatory Visit: Payer: Self-pay | Admitting: Physician Assistant

## 2024-08-20 DIAGNOSIS — R042 Hemoptysis: Secondary | ICD-10-CM | POA: Diagnosis not present

## 2024-08-20 DIAGNOSIS — I4719 Other supraventricular tachycardia: Secondary | ICD-10-CM

## 2024-08-20 DIAGNOSIS — R0600 Dyspnea, unspecified: Secondary | ICD-10-CM | POA: Diagnosis not present

## 2024-08-20 DIAGNOSIS — C3431 Malignant neoplasm of lower lobe, right bronchus or lung: Secondary | ICD-10-CM

## 2024-08-20 DIAGNOSIS — J90 Pleural effusion, not elsewhere classified: Secondary | ICD-10-CM | POA: Diagnosis not present

## 2024-08-20 DIAGNOSIS — R000 Tachycardia, unspecified: Secondary | ICD-10-CM | POA: Diagnosis not present

## 2024-08-20 DIAGNOSIS — J441 Chronic obstructive pulmonary disease with (acute) exacerbation: Secondary | ICD-10-CM | POA: Diagnosis not present

## 2024-08-20 LAB — COMPREHENSIVE METABOLIC PANEL WITH GFR
ALT: 9 U/L (ref 0–44)
AST: 27 U/L (ref 15–41)
Albumin: 2.4 g/dL — ABNORMAL LOW (ref 3.5–5.0)
Alkaline Phosphatase: 61 U/L (ref 38–126)
Anion gap: 9 (ref 5–15)
BUN: 16 mg/dL (ref 8–23)
CO2: 24 mmol/L (ref 22–32)
Calcium: 8.7 mg/dL — ABNORMAL LOW (ref 8.9–10.3)
Chloride: 105 mmol/L (ref 98–111)
Creatinine, Ser: 0.6 mg/dL (ref 0.44–1.00)
GFR, Estimated: >60 mL/min (ref >60)
Glucose, Bld: 147 mg/dL — ABNORMAL HIGH (ref 70–99)
Potassium: 4 mmol/L (ref 3.5–5.1)
Sodium: 138 mmol/L (ref 135–145)
Total Bilirubin: 0.5 mg/dL (ref 0.0–1.2)
Total Protein: 6.4 g/dL — ABNORMAL LOW (ref 6.5–8.1)

## 2024-08-20 LAB — CBC WITH DIFFERENTIAL/PLATELET
Abs Immature Granulocytes: 0.07 K/uL (ref 0.00–0.07)
Basophils Absolute: 0 K/uL (ref 0.0–0.1)
Basophils Relative: 0 %
Eosinophils Absolute: 0.1 K/uL (ref 0.0–0.5)
Eosinophils Relative: 1 %
HCT: 29.1 % — ABNORMAL LOW (ref 36.0–46.0)
Hemoglobin: 8.3 g/dL — ABNORMAL LOW (ref 12.0–15.0)
Immature Granulocytes: 1 %
Lymphocytes Relative: 10 %
Lymphs Abs: 0.9 K/uL (ref 0.7–4.0)
MCH: 24.9 pg — ABNORMAL LOW (ref 26.0–34.0)
MCHC: 28.5 g/dL — ABNORMAL LOW (ref 30.0–36.0)
MCV: 87.1 fL (ref 80.0–100.0)
Monocytes Absolute: 1.4 K/uL — ABNORMAL HIGH (ref 0.1–1.0)
Monocytes Relative: 15 %
Neutro Abs: 7 K/uL (ref 1.7–7.7)
Neutrophils Relative %: 73 %
Platelets: 498 K/uL — ABNORMAL HIGH (ref 150–400)
RBC: 3.34 MIL/uL — ABNORMAL LOW (ref 3.87–5.11)
RDW: 18.2 % — ABNORMAL HIGH (ref 11.5–15.5)
WBC: 9.5 K/uL (ref 4.0–10.5)
nRBC: 0 % (ref 0.0–0.2)

## 2024-08-20 LAB — MAGNESIUM: Magnesium: 1.9 mg/dL (ref 1.7–2.4)

## 2024-08-20 LAB — GLUCOSE, CAPILLARY
Glucose-Capillary: 141 mg/dL — ABNORMAL HIGH (ref 70–99)
Glucose-Capillary: 141 mg/dL — ABNORMAL HIGH (ref 70–99)
Glucose-Capillary: 240 mg/dL — ABNORMAL HIGH (ref 70–99)
Glucose-Capillary: 279 mg/dL — ABNORMAL HIGH (ref 70–99)
Glucose-Capillary: 387 mg/dL — ABNORMAL HIGH (ref 70–99)

## 2024-08-20 LAB — PHOSPHORUS: Phosphorus: 3.2 mg/dL (ref 2.5–4.6)

## 2024-08-20 NOTE — Plan of Care (Signed)
  Problem: Metabolic: Goal: Ability to maintain appropriate glucose levels will improve Outcome: Progressing   Problem: Skin Integrity: Goal: Risk for impaired skin integrity will decrease Outcome: Progressing   Problem: Tissue Perfusion: Goal: Adequacy of tissue perfusion will improve Outcome: Progressing   Problem: Activity: Goal: Ability to tolerate increased activity will improve Outcome: Progressing   Problem: Respiratory: Goal: Levels of oxygenation will improve Outcome: Progressing Goal: Ability to maintain adequate ventilation will improve Outcome: Progressing   Problem: Clinical Measurements: Goal: Respiratory complications will improve Outcome: Progressing Goal: Cardiovascular complication will be avoided Outcome: Progressing

## 2024-08-20 NOTE — Progress Notes (Signed)
 Lindsey Williamson   DOB:[birth year]   FM#:[id number]      ASSESSMENT & PLAN:  Lindsey Williamson is a 73 year old female patient with oncologic diagnosis significant for NSCLC.  She was admitted on 08/16/2024 with complaints of shortness of breath and hemoptysis.  Follows with medical oncology Dr. Sherrod.  Recurrent non-small cell lung cancer, adenocarcinoma RUL - Diagnosed 2023 - Status post concurrent chemoradiation.  Subsequently received consolidation immunotherapy which was discontinued after cycle 1 due to concern for pneumonitis.  Chemotherapy currently on hold. - Patient last seen in outpatient oncology office on 08/13/2024.  At that time palliative systemic chemotherapy with carbo, Keytruda  followed by Neulasta  planned every 3 weeks.  Received cycle 1 on 07/23/2024. - Medical oncology/Dr. Sherrod following.  Recommend close outpatient oncology follow-up upon discharge.  COPD exacerbation Shortness of breath Hemoptysis - Patient admitted 08/16/2024 with complaints of progressive shortness of breath and hemoptysis - Likely secondary to malignancy - Continue respiratory treatments and supportive care  Anemia - Hemoglobin low 8.3 - Likely multifactorial - Recommend PRBC transfusion for Hgb <7.0 - Continue to monitor CBC with differential  Thrombocytosis - Likely reactive - Platelets 498K - No intervention required at this time - Continue to monitor CBC with differential    Code Status Full   Subjective:  Patient seen awake and alert sitting on bedside commode.  Complains of ongoing shortness of breath. No chest pain. No acute GI complaints.    Objective:   Intake/Output Summary (Last 24 hours) at 08/20/2024 1225 Last data filed at 08/20/2024 1208 Gross per 24 hour  Intake 700 ml  Output 350 ml  Net 350 ml     PHYSICAL EXAMINATION: ECOG PERFORMANCE STATUS: 2 - Symptomatic, <50% confined to bed  Vitals:   08/20/24 1000 08/20/24 1206  BP: (!) 123/48 (!) 151/71   Pulse: 89   Resp: (!) 25   Temp:    SpO2: 96%    Filed Weights   08/16/24 1434 08/18/24 0315 08/19/24 0310  Weight: 146 lb (66.2 kg) 154 lb 1.6 oz (69.9 kg) 160 lb 7.9 oz (72.8 kg)    GENERAL: alert, no distress and comfortable SKIN: Sign pale skin color, texture, turgor are normal, no rashes or significant lesions EYES: normal, conjunctiva are pink and non-injected, sclera clear OROPHARYNX: no exudate, no erythema and lips, buccal mucosa, and tongue normal  NECK: supple, thyroid  normal size, non-tender, without nodularity LYMPH: no palpable lymphadenopathy in the cervical, axillary or inguinal LUNGS: + Diminished to auscultation +complaints of shortness of breath HEART: regular rate & rhythm and no murmurs and no lower extremity edema ABDOMEN: abdomen soft, non-tender and normal bowel sounds MUSCULOSKELETAL: no cyanosis of digits and no clubbing  PSYCH: alert & oriented x 3 with fluent speech NEURO: no focal motor/sensory deficits   All questions were answered. The patient knows to call the clinic with any problems, questions or concerns.   The total time spent in the appointment was 40 minutes encounter with patient including review of chart and various tests results, discussions about plan of care and coordination of care plan  Olam JINNY Brunner, NP 08/20/2024 12:25 PM    Labs Reviewed:  Lab Results  Component Value Date   WBC 9.5 08/20/2024   HGB 8.3 (L) 08/20/2024   HCT 29.1 (L) 08/20/2024   MCV 87.1 08/20/2024   PLT 498 (H) 08/20/2024   Recent Labs    08/17/24 0349 08/17/24 1402 08/18/24 0321 08/18/24 1142 08/19/24 0301 08/20/24 0754  NA 133*   < >  140  --  140 138  K 4.1   < > 3.6  --  3.2* 4.0  CL 102   < > 105  --  105 105  CO2 12*   < > 23  --  22 24  GLUCOSE 231*   < > 245*  --  192* 147*  BUN 12   < > 16  --  11 16  CREATININE 0.67   < > 0.78  --  0.47 0.60  CALCIUM  8.8*   < > 8.6*  --  8.8* 8.7*  GFRNONAA >60   < > >60  --  >60 >60  PROT 7.5  --    --  6.8 6.6 6.4*  ALBUMIN 2.6*  --   --   --  2.6* 2.4*  AST 11*  --   --   --  12* 27  ALT 5  --   --   --  6 9  ALKPHOS 75  --   --   --  67 61  BILITOT 1.2  --   --   --  0.3 0.5   < > = values in this interval not displayed.    Studies Reviewed:  DG CHEST PORT 1 VIEW Result Date: 08/20/2024 CLINICAL DATA:  Follow-up right pleural effusion. EXAM: PORTABLE CHEST 1 VIEW COMPARISON:  08/19/2024 and chest CTA dated 08/16/2024. FINDINGS: The heart size can not be assessed due to obscuration of the right heart border. No significant change in confluence and patchy increased density in the right mid and lower lung zones. No significant change in chronic bilateral interstitial lung disease and right lung bullous changes. Stable laterally tracking pleural fluid on the right. Tortuous and partially calcified thoracic aorta. Unremarkable bones. IMPRESSION: 1. No significant change in right mid and lower lung zone pneumonia/atelectasis and right pleural effusion. 2. Stable chronic bilateral interstitial lung disease and right lung bullous changes. Electronically Signed   By: Elspeth Bathe M.D.   On: 08/20/2024 10:49   DG CHEST PORT 1 VIEW Result Date: 08/19/2024 CLINICAL DATA:  Shortness of breath. EXAM: PORTABLE CHEST 1 VIEW COMPARISON:  08/18/2024. FINDINGS: The cardio pericardial silhouette is enlarged. Right base collapse/consolidation with moderate right effusion is similar. Background diffuse interstitial opacity is similar. No left pleural effusion. Bones are diffusely demineralized. Telemetry leads overlie the chest. IMPRESSION: No substantial interval change. Right base collapse/consolidation with moderate right effusion. Electronically Signed   By: Camellia Candle M.D.   On: 08/19/2024 09:17   DG CHEST PORT 1 VIEW Result Date: 08/18/2024 CLINICAL DATA:  Pleural effusion.  Lung carcinoma.  Thoracentesis. EXAM: PORTABLE CHEST 1 VIEW COMPARISON:  05/18/2024 FINDINGS: Moderate right pleural effusion shows  increase in size since prior exam. Increased right lower lung atelectasis. No pneumothorax visualized. Chronic interstitial lung disease again noted. Stable heart size. IMPRESSION: Increased size of moderate right pleural effusion and right lower lung atelectasis. No pneumothorax visualized. Chronic interstitial lung disease. Electronically Signed   By: Norleen DELENA Kil M.D.   On: 08/18/2024 11:57   CT Angio Chest PE W and/or Wo Contrast Result Date: 08/16/2024 CLINICAL DATA:  Shortness of breath, hemoptysis. History of lung cancer. EXAM: CT ANGIOGRAPHY CHEST WITH CONTRAST TECHNIQUE: Multidetector CT imaging of the chest was performed using the standard protocol during bolus administration of intravenous contrast. Multiplanar CT image reconstructions and MIPs were obtained to evaluate the vascular anatomy. RADIATION DOSE REDUCTION: This exam was performed according to the departmental dose-optimization program which includes automated exposure  control, adjustment of the mA and/or kV according to patient size and/or use of iterative reconstruction technique. CONTRAST:  75mL OMNIPAQUE  IOHEXOL  350 MG/ML SOLN COMPARISON:  July 16, 2024.  June 28, 2024. FINDINGS: Cardiovascular: Satisfactory opacification of the pulmonary arteries to the segmental level. No evidence of pulmonary embolism. Mild cardiomegaly. No pericardial effusion. Mediastinum/Nodes: Thyroid  gland is unremarkable. The esophagus is unremarkable. Extensive adenopathy is noted in the subcarinal and right hilar regions as noted on recent PET scan. This mass appears to extend into and occlude the right lower lobe bronchus proximally. Lungs/Pleura: No pneumothorax is noted. Emphysematous disease is noted. 3.7 cm mass noted posteriorly in left lower lobe consistent with metastatic disease. Moderate size right pleural effusion is noted with adjacent atelectasis or infiltrate of residual right upper lobe. Upper Abdomen: No acute abnormality. Musculoskeletal: No  chest wall abnormality. No acute or significant osseous findings. Review of the MIP images confirms the above findings. IMPRESSION: 1. No definite evidence of pulmonary embolus. 2. Extensive subcarinal and right hilar adenopathy is noted as noted on recent PET scan. This mass appears to extend into and occlude the right lower lobe bronchus proximally. 3. Moderate size right pleural effusion is noted with adjacent atelectasis or infiltrate of residual right upper lobe. 4. 3.7 cm mass noted posteriorly in left lower lobe consistent with metastatic disease. 5. Emphysema. Aortic Atherosclerosis (ICD10-I70.0) and Emphysema (ICD10-J43.9). Electronically Signed   By: Lynwood Landy Raddle M.D.   On: 08/16/2024 17:30

## 2024-08-20 NOTE — Progress Notes (Signed)
 NAME:  Lindsey Williamson, MRN:  [id number], DOB:  18-Dec-1951, LOS: 4 ADMISSION DATE:  08/16/2024, CONSULTATION DATE: 08/17/2024 REFERRING MD: Dr. 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  Followed in our office by Dr Neda, chronic hypoxemic respiratory failure on continuous O2.  Repeat bronchoscopy 05/19/2024 for hemoptysis showed an endobronchial lesion in the bronchus intermedius extending to the right lower lobe with associated bleeding.  Followed conservatively. She underwent chemoradiation with right upper lobe XRT, and then Imfinzi  complicated by associated pneumonitis June 2025 treated with steroids.  Most recent imaging included PET scan 07/16/2024 that showed progressive disease in the right lower lobe with associated complete atelectasis, contralateral left lower lobe metastasis (versus primary), progressive hilar and mediastinal adenopathy.  Current therapy is carboplatin /Paclitaxol + Keytruda .  Has continued to have cough, tried Hydromet with only partial success.  Her treatment was delayed and she was treated for possible bronchitis with levofloxacin  08/13/2024. Admitted now with progressive dyspnea, increased hemoptysis since the May hospitalization and bronchoscopy.  In the emergency department she received TXA nebs. She describes bloody mucous, at it's heaviest about 1/2 a cup on day prior to presentation. Usually bright red, sometimes dark spotty clots. Last time she saw any was this am - scant dark blood (after the TXA).    Pertinent  Medical History   Past Medical History:  Diagnosis Date   Angioedema 10/17/2023   Arthritis    Diabetes mellitus without complication (HCC)    Hypertension    Significant Hospital Events: Including  procedures, antibiotic start and stop dates in addition to other pertinent events   8/21 CT-PA >> no pulmonary embolism, extensive subcarinal and right hilar adenopathy that appears to extend and occlude the right lower lobe bronchus proximally.  3.7 cm left lower lobe mass.  Moderate right pleural effusion with associated right upper lobe atelectasis 8/24 thoracentesis for 500 amber-colored fluid, exudative  Interim History / Subjective:   No acute events overnight Did have some hemoptysis this morning Overall breathing is improved  Objective    Blood pressure (!) 143/78, pulse 75, temperature (!) 97.4 F (36.3 C), temperature source Oral, resp. rate 15, height 5' 4 (1.626 m), weight 72.8 kg, SpO2 100%.        Intake/Output Summary (Last 24 hours) at 08/20/2024 0748 Last data filed at 08/20/2024 0005 Gross per 24 hour  Intake 1065.04 ml  Output --  Net 1065.04 ml   Filed Weights   08/16/24 1434 08/18/24 0315 08/19/24 0310  Weight: 66.2 kg 69.9 kg 72.8 kg    Examination: General: Chronically ill-appearing, comfortable, on oxygen supplementation HENT: Moist oral mucosa Lungs: decreased breath sounds right base, no wheezing Cardiovascular: S1-S2 appreciated Abdomen: Soft, bowel sounds appreciated Extremities: No clubbing, no edema Neuro: Awake alert moving extremities appropriately GU:   Resolved problem list   Assessment and Plan   Patient with stage IIIa squamous cell lung cancer diagnosed October 2024 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 Gatha - On palliative systemic chemotherapy with carboplatin  and paclitaxel , Keytruda   Large right pleural effusion Post-thoracentesis 8/23 with removed Exudative effusion - follow up cytology  - May benefit from Pleurx catheter placement if effusion found to be malignant  Hemoptysis Improved with tranexamic acid  -s/p  tranexamic  acid every 8hr for 2 days, completed 8/24 - continue to monitor - Options for management of the hemoptysis may have to include bronchial artery embolization if she continues to bleed significantly despite ongoing treatment - Flexible bronchoscopy will likely not be of any benefit as we already know she does have endobronchial involvement  Chronic obstructive pulmonary disease/emphysema Chronic respiratory failure - Continue oxygen supplementation, goal SpO2 92% or greater - Continue bronchodilator treatment  Diabetes, hypertension, hyperlipidemia   Best Practice (right click and Reselect all SmartList Selections daily)   Per Primary service  Labs   CBC: Recent Labs  Lab 08/13/24 0843 08/16/24 1529 08/17/24 0349 08/18/24 0321 08/19/24 0301  WBC 10.9* 12.4* 12.5* 12.9* 11.3*  NEUTROABS 8.6* 10.0*  --   --  8.8*  HGB 10.0* 9.7* 9.0* 8.9* 9.2*  HCT 32.1* 33.6* 30.8* 31.1* 32.2*  MCV 83.4 87.0 88.5 87.9 88.5  PLT 440* 500* 474* 495* 560*    Basic Metabolic Panel: Recent Labs  Lab 08/16/24 1529 08/16/24 2002 08/17/24 0349 08/17/24 1402 08/18/24 0321 08/19/24 0301  NA 139  --  133* 142 140 140  K 3.9  --  4.1 3.8 3.6 3.2*  CL 107  --  102 106 105 105  CO2 17*  --  12* 16* 23 22  GLUCOSE 154*  --  231* 200* 245* 192*  BUN 11  --  12 14 16 11   CREATININE 0.61  --  0.67 0.80 0.78 0.47  CALCIUM  9.6  --  8.8* 9.1 8.6* 8.8*  MG  --  1.5* 2.0  --  1.7 1.6*  PHOS  --  3.0 3.5  --  3.1 1.9*   GFR: Estimated Creatinine Clearance: 60.3 mL/min (by C-G formula based on SCr of 0.47 mg/dL). Recent Labs  Lab 08/16/24 1529 08/16/24 2002 08/17/24 0349 08/18/24 0321 08/19/24 0301  WBC 12.4*  --  12.5* 12.9* 11.3*  LATICACIDVEN  --  1.1  --   --   --     Liver Function Tests: Recent Labs  Lab 08/13/24 0843 08/16/24 1529 08/17/24 0349 08/18/24 1142 08/19/24 0301  AST 11* 15 11*  --  12*  ALT <5 <5 5  --  6  ALKPHOS 87 82 75  --  67  BILITOT 0.3 0.9 1.2  --  0.3   PROT 7.4 8.2* 7.5 6.8 6.6  ALBUMIN 3.5 2.8* 2.6*  --  2.6*   No results for input(s): LIPASE, AMYLASE in the last 168 hours. No results for input(s): AMMONIA in the last 168 hours.  ABG    Component Value Date/Time   HCO3 19.8 (L) 08/16/2024 1612   ACIDBASEDEF 4.0 (H) 08/16/2024 1612   O2SAT 36.5 08/16/2024 1612     Coagulation Profile: Recent Labs  Lab 08/16/24 1612  INR 1.0    Cardiac Enzymes: Recent Labs  Lab 08/16/24 2002  CKTOTAL 27*    HbA1C: Hgb A1c MFr Bld  Date/Time Value Ref Range Status  08/17/2024 04:21 AM 9.3 (H) 4.8 - 5.6 % Final    Comment:    (NOTE) Diagnosis of Diabetes The following HbA1c ranges recommended by the American Diabetes Association (ADA) may be used as an aid in the diagnosis of diabetes mellitus.  Hemoglobin             Suggested A1C NGSP%              Diagnosis  <5.7  Non Diabetic  5.7-6.4                Pre-Diabetic  >6.4                   Diabetic  <7.0                   Glycemic control for                       adults with diabetes.    02/16/2024 05:59 AM 7.4 (H) 4.8 - 5.6 % Final    Comment:    (NOTE) Pre diabetes:          5.7%-6.4%  Diabetes:              >6.4%  Glycemic control for   <7.0% adults with diabetes     CBG: Recent Labs  Lab 08/19/24 1359 08/19/24 1654 08/19/24 2002 08/19/24 2358 08/20/24 0314  GLUCAP 318* 341* 382* 252* 141*    Dorn Chill, MD [facility] Office: [phone]   See Amion for personal pager PCCM on call pager [phone] until 7pm. Please call Elink 7p-7a. [phone]

## 2024-08-20 NOTE — Progress Notes (Signed)
 PROGRESS NOTE    Lindsey Williamson  FMW:[id number] DOB: 10-Jun-1951 DOA: 08/16/2024 PCP: [name], MD     Brief Narrative: 73 y.o. female with a hx of RLL cancer, ILD, COPD, DM2, CAD, HTN, HLD, chronic respiratory failure who presents with Hemoptysis and progressive dyspnea since her prior hospitalization.  She was treated with TXA nebs with improvement in hemoptysis.  She was also found to have worsening pleural effusion status post thoracentesis awaiting studies.  Consulted pulmonary and cardiology.    Assessment & Plan:   Principal Problem:   COPD exacerbation (HCC) Active Problems:   Primary cancer of right lower lobe of lung (HCC)   Hyperlipidemia   Hypertension   Type 2 diabetes mellitus with microalbuminuria (HCC)   Dyspnea   ILD (interstitial lung disease) (HCC)   Hemoptysis   Hypomagnesemia   Prolonged QT interval   Atrial tachycardia (HCC)   Hemoptysis: Improved with TXA nebs and Most likely secondary to underlying malignancy Seen by PCCM/pulmonary; Currently no intervention but if worsens they Recommended to consultation with IR to see if there is a target bronchial artery that could be embolized and ablated. Other option would be discussion with thoracic surgery regarding possible APC or laser via bronchoscopy. Pulmonary feels Standard bronchoscopy would not really offer much in the way of hemostasis, although could treat tumor with epinephrine  for some temporary effect -risk probably not worth the low benefit. Cryotherapy by bronchoscopy would allow some tumor debridement but would probably not decrease frequency of bleeding.  Continuing TXA every 8 hours for 2 days.   COPD exacerbation / ILD and chronic respiratory failure on 2 L at home: Continue supplemental O2 inhalation, C/w Empiric antibiotics ceftriaxone . IV Solu-Medrol  followed by tapering Prednisone  (Now on 40 mg Prednisone ), DuoNeb every 6 hourly changed to TID and continuing Umeclidinium-Vilanterol 1 puff  IH Daily; C/w Xopenex  0.63 mg Neb q4hprn Wheezing; C/w Guaifenesin  600 mg p.o. twice daily. Repeat CXR pending   sinus tachycardia versus atrial tachycardia: Continue to monitor on telemetry Seen by cardiology they feel that her heart rate is improved with improvement in her pulm status.  They believe this is most likely sinus tachycardia but she may have had a brief atrial tachycardia episode in the ED. Continue bisoprolol ; They started Cardizem  CD 120 mg p.o. daily but now changed to Dilitazem 90 mg q6h. Cardiology recommending consolidating to LA on discharge, Discontinued amlodipine , Follow cardiology for further recommendation   Right Sided Pleural Effusion: Pulmonary pursing Thoracentesis for further evaluation and she had 500 mL off. They note that this is likely related to a neoplastic process however this not been proven cytologically. Awaiting Cytological Studies but this an Exudative Effusion.  Pulmonary feels that it is essential to confirm malignant effusion as cause of nonmalignant effusion may be related to postobstructive pneumonia radiation or chemo changes or other issues such as trapped lung or infection.  They feel that if she does have a malignant effusion that she may benefit from a Pleurx catheter.  As of now the cytology still pending.   Metabolic acidosis: 8/21 Bicarb level 12 very low; Bicarbonate 100 mEq every hourly x 2 doses given; Repeat bicarb level 16, another dose of bicarbonate 100 mEq x 1 given.  Now her metabolic acidosis has resolved.  Essential Hypertension: PTA amlodipine  discontinued; C/w Bisoprolol  10 mg po Daily and Cardizem  CD 120 mg p.o. daily being changed to Diltiazem  90 mg po q6h by Cardiology. 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 Follows with Dr. Sherrod and currently on palliative  systemic chemotherapy with carboplatin  and paclitaxel  as well as Keytruda .  Prior hospitalist informed Dr. Sherrod of admission.    Essential Hypertension: PTA amlodipine  discontinued; continue with Cardizem  and bisoprolol .      Electrolyte Abnormalities: Resolved  Normocytic Anemia: Hemoglobin 8.3 from 9.2 yesterday no active bleeding overnight but did have hemoptysis on admission.  Anemia panel indicates low iron of 27.  Follow clinically will hold off on transfusion at this point will monitor closely and follow-up levels in AM.     Uncontrolled Diabetes Mellitus Type 2: Hemoglobin A1c 9.3, elevated; At risk for hyperglycemia due to steroid. C/w NovoLog  sliding scale and Monitor CBG, diabetic diet; CBG (last 3)  Recent Labs    08/19/24 2358 08/20/24 0314 08/20/24 0742  GLUCAP 252* 141* 141*     CAD and HLD: Continue statin; Held aspirin  for now due to hemoptysis   Anxiety: C/w Atarax  25 mg p.o. 3 times daily   Overweight: Complicates overall prognosis and care. Estimated body mass index is 27.55 kg/m as calculated from the following:   Height as of this encounter: 5' 4 (1.626 m).   Weight as of this encounter: 72.8 kg. Weight Loss and Dietary Counseling given     Nutrition Problem: Increased nutrient needs Etiology: cancer and cancer related treatments  Signs/Symptoms: estimated needs     Estimated body mass index is 27.55 kg/m as calculated from the following:   Height as of this encounter: 5' 4 (1.626 m).   Weight as of this encounter: 72.8 kg.  DVT prophylaxis: SCD Code Status: Full code Family Communication: No  disposition Plan:  Status is: Inpatient Remains inpatient appropriate because: Acute illness   Consultants:  PCCM and cardiology  Procedures: Thoracentesis  Antimicrobials:  Anti-infectives (From admission, onward)    Start     Dose/Rate Route Frequency Ordered Stop   08/17/24 1600  cefTRIAXone  (ROCEPHIN ) 1 g in sodium chloride  0.9 % 100 mL IVPB         1 g 200 mL/hr over 30 Minutes Intravenous Every 24 hours 08/16/24 2331 08/22/24 1559   08/16/24 1900  cefTRIAXone  (ROCEPHIN ) 2 g in sodium chloride  0.9 % 100 mL IVPB        2 g 200 mL/hr over 30 Minutes Intravenous  Once 08/16/24 1849 08/16/24 1955   08/16/24 1900  doxycycline  (VIBRAMYCIN ) 100 mg in sodium chloride  0.9 % 250 mL IVPB        100 mg 125 mL/hr over 120 Minutes Intravenous  Once 08/16/24 1849 08/16/24 2218        Subjective:  Seen at the bedside Slept better Coughing better Denies any hemoptysis  Objective: Vitals:   08/20/24 0743 08/20/24 0800 08/20/24 0818 08/20/24 0833  BP:  (!) 152/75    Pulse:  86 85   Resp:  (!) 29 (!) 21   Temp: (!) 97.4 F (36.3 C)     TempSrc: Oral     SpO2:  97% 90% 98%  Weight:      Height:        Intake/Output Summary (Last 24 hours) at 08/20/2024 0905 Last data filed at 08/20/2024 0828 Gross per 24 hour  Intake 1065.04 ml  Output 350 ml  Net 715.04 ml   Filed Weights   08/16/24 1434 08/18/24 0315 08/19/24 0310  Weight: 66.2 kg 69.9 kg 72.8 kg    Examination:  General exam: Appears calm and comfortable resting in bed Respiratory system: Clear to auscultation. Respiratory effort normal. Cardiovascular system: Occasional tachycardia noted  gastrointestinal system: Abdomen is nondistended, soft and nontender. No organomegaly or masses felt. Normal bowel sounds heard. Central nervous system: Alert and oriented. No focal neurological deficits. Extremities: Trace edema  Data Reviewed: I have personally reviewed following labs and imaging studies  CBC: Recent Labs  Lab 08/16/24 1529 08/17/24 0349 08/18/24 0321 08/19/24 0301 08/20/24 0754  WBC 12.4* 12.5* 12.9* 11.3* 9.5  NEUTROABS 10.0*  --   --  8.8* 7.0  HGB 9.7* 9.0* 8.9* 9.2* 8.3*  HCT 33.6* 30.8* 31.1* 32.2* 29.1*  MCV 87.0 88.5 87.9 88.5 87.1  PLT 500* 474* 495* 560* 498*   Basic Metabolic Panel: Recent Labs  Lab 08/16/24 2002 08/17/24 0349  08/17/24 1402 08/18/24 0321 08/19/24 0301 08/20/24 0754  NA  --  133* 142 140 140 138  K  --  4.1 3.8 3.6 3.2* 4.0  CL  --  102 106 105 105 105  CO2  --  12* 16* 23 22 24   GLUCOSE  --  231* 200* 245* 192* 147*  BUN  --  12 14 16 11 16   CREATININE  --  0.67 0.80 0.78 0.47 0.60  CALCIUM   --  8.8* 9.1 8.6* 8.8* 8.7*  MG 1.5* 2.0  --  1.7 1.6* 1.9  PHOS 3.0 3.5  --  3.1 1.9* 3.2   GFR: Estimated Creatinine Clearance: 60.3 mL/min (by C-G formula based on SCr of 0.6 mg/dL). Liver Function Tests: Recent Labs  Lab 08/16/24 1529 08/17/24 0349 08/18/24 1142 08/19/24 0301 08/20/24 0754  AST 15 11*  --  12* 27  ALT <5 5  --  6 9  ALKPHOS 82 75  --  67 61  BILITOT 0.9 1.2  --  0.3 0.5  PROT 8.2* 7.5 6.8 6.6 6.4*  ALBUMIN 2.8* 2.6*  --  2.6* 2.4*   No results for input(s): LIPASE, AMYLASE in the last 168 hours. No results for input(s): AMMONIA in the last 168 hours. Coagulation Profile: Recent Labs  Lab 08/16/24 1612  INR 1.0   Cardiac Enzymes: Recent Labs  Lab 08/16/24 2002  CKTOTAL 27*   BNP (last 3 results) No results for input(s): PROBNP in the last 8760 hours. HbA1C: No results for input(s): HGBA1C in the last 72 hours. CBG: Recent Labs  Lab 08/19/24 1654 08/19/24 2002 08/19/24 2358 08/20/24 0314 08/20/24 0742  GLUCAP 341* 382* 252* 141* 141*   Lipid Profile: No results for input(s): CHOL, HDL, LDLCALC, TRIG, CHOLHDL, LDLDIRECT in the last 72 hours. Thyroid  Function Tests: No results for input(s): TSH, T4TOTAL, FREET4, T3FREE, THYROIDAB in the last 72 hours. Anemia Panel: Recent Labs    08/17/24 1402 08/19/24 0301 08/19/24 0725  VITAMINB12 1,978*  --  1,832*  FOLATE 15.9 8.8  --   FERRITIN 198  --  169  TIBC 241*  --  221*  IRON 19*  --  27*  RETICCTPCT  --  2.2  --    Sepsis Labs: Recent Labs  Lab 08/16/24 2002  LATICACIDVEN 1.1    Recent Results (from the past 240 hours)  Resp panel by RT-PCR (RSV, Flu A&B,  Covid) Anterior Nasal Swab     Status: None   Collection Time: 08/16/24  9:24 PM   Specimen: Anterior Nasal Swab  Result Value Ref Range Status  SARS Coronavirus 2 by RT PCR NEGATIVE NEGATIVE Final    Comment: (NOTE) SARS-CoV-2 target nucleic acids are NOT DETECTED.  The SARS-CoV-2 RNA is generally detectable in upper respiratory specimens during the acute phase of infection. The lowest concentration of SARS-CoV-2 viral copies this assay can detect is 138 copies/mL. A negative result does not preclude SARS-Cov-2 infection and should not be used as the sole basis for treatment or other patient management decisions. A negative result may occur with  improper specimen collection/handling, submission of specimen other than nasopharyngeal swab, presence of viral mutation(s) within the areas targeted by this assay, and inadequate number of viral copies(<138 copies/mL). A negative result must be combined with clinical observations, patient history, and epidemiological information. The expected result is Negative.  Fact Sheet for Patients:  BloggerCourse.com  Fact Sheet for Healthcare Providers:  SeriousBroker.it  This test is no t yet approved or cleared by the [place]  FDA and  has been authorized for detection and/or diagnosis of SARS-CoV-2 by FDA under an Emergency Use Authorization (EUA). This EUA will remain  in effect (meaning this test can be used) for the duration of the COVID-19 declaration under Section 564(b)(1) of the Act, 21 U.S.C.section 360bbb-3(b)(1), unless the authorization is terminated  or revoked sooner.       Influenza A by PCR NEGATIVE NEGATIVE Final   Influenza B by PCR NEGATIVE NEGATIVE Final    Comment: (NOTE) The Xpert Xpress SARS-CoV-2/FLU/RSV plus assay is intended as an aid in the diagnosis of influenza from Nasopharyngeal swab specimens and should not be used as a sole basis for treatment. Nasal  washings and aspirates are unacceptable for Xpert Xpress SARS-CoV-2/FLU/RSV testing.  Fact Sheet for Patients: BloggerCourse.com  Fact Sheet for Healthcare Providers: SeriousBroker.it  This test is not yet approved or cleared by the [place]  FDA and has been authorized for detection and/or diagnosis of SARS-CoV-2 by FDA under an Emergency Use Authorization (EUA). This EUA will remain in effect (meaning this test can be used) for the duration of the COVID-19 declaration under Section 564(b)(1) of the Act, 21 U.S.C. section 360bbb-3(b)(1), unless the authorization is terminated or revoked.     Resp Syncytial Virus by PCR NEGATIVE NEGATIVE Final    Comment: (NOTE) Fact Sheet for Patients: BloggerCourse.com  Fact Sheet for Healthcare Providers: SeriousBroker.it  This test is not yet approved or cleared by the [place]  FDA and has been authorized for detection and/or diagnosis of SARS-CoV-2 by FDA under an Emergency Use Authorization (EUA). This EUA will remain in effect (meaning this test can be used) for the duration of the COVID-19 declaration under Section 564(b)(1) of the Act, 21 U.S.C. section 360bbb-3(b)(1), unless the authorization is terminated or revoked.  Performed at [facility], [address] [place], [place] [address]   MRSA Next Gen by PCR, Nasal     Status: None   Collection Time: 08/17/24  2:05 PM   Specimen: Nasal Mucosa; Nasal Swab  Result Value Ref Range Status   MRSA by PCR Next Gen NOT DETECTED NOT DETECTED Final    Comment: (NOTE) The GeneXpert MRSA Assay (FDA approved for NASAL specimens only), is one component of a comprehensive MRSA colonization surveillance program. It is not intended to diagnose MRSA infection nor to guide or monitor treatment for MRSA infections. Test performance is not FDA approved in patients less  than 30 years old. Performed at [facility], [address] [place], [place] [address]   Body fluid culture  w Gram Stain     Status: None (Preliminary result)   Collection Time: 08/18/24 11:13 AM   Specimen: Pleural Fluid  Result Value Ref Range Status   Specimen Description   Final    PLEURAL Performed at [facility], [address], [place], [place] [address]    Special Requests   Final    NONE Performed at [facility], [address] [place], [place] [address]    Gram Stain   Final    WBC PRESENT,BOTH PMN AND MONONUCLEAR RED BLOOD CELLS PRESENT NO ORGANISMS SEEN    Culture   Final    NO GROWTH < 24 HOURS Performed at [facility], [address] [place], [place] [address]    Report Status PENDING  Incomplete         Radiology Studies: DG CHEST PORT 1 VIEW Result Date: 08/19/2024 CLINICAL DATA:  Shortness of breath. EXAM: PORTABLE CHEST 1 VIEW COMPARISON:  08/18/2024. FINDINGS: The cardio pericardial silhouette is enlarged. Right base collapse/consolidation with moderate right effusion is similar. Background diffuse interstitial opacity is similar. No left pleural effusion. Bones are diffusely demineralized. Telemetry leads overlie the chest. IMPRESSION: No substantial interval change. Right base collapse/consolidation with moderate right effusion. Electronically Signed   By: Camellia Candle M.D.   On: 08/19/2024 09:17   DG CHEST PORT 1 VIEW Result Date: 08/18/2024 CLINICAL DATA:  Pleural effusion.  Lung carcinoma.  Thoracentesis. EXAM: PORTABLE CHEST 1 VIEW COMPARISON:  05/18/2024 FINDINGS: Moderate right pleural effusion shows increase in size since prior exam. Increased right lower lung atelectasis. No pneumothorax visualized. Chronic interstitial lung disease again noted. Stable heart size. IMPRESSION: Increased size of moderate right pleural effusion and right lower lung atelectasis. No pneumothorax  visualized. Chronic interstitial lung disease. Electronically Signed   By: Norleen DELENA Kil M.D.   On: 08/18/2024 11:57    Scheduled Meds:  bisoprolol   10 mg Oral Daily   Chlorhexidine  Gluconate Cloth  6 each Topical Q0600   diltiazem   90 mg Oral Q6H   feeding supplement (GLUCERNA SHAKE)  237 mL Oral TID BM   guaiFENesin   600 mg Oral BID   hydrOXYzine   25 mg Oral TID   insulin  aspart  0-9 Units Subcutaneous Q4H   ipratropium-albuterol   3 mL Nebulization TID   multivitamin with minerals  1 tablet Oral Daily   pantoprazole   40 mg Oral Daily   pravastatin   80 mg Oral QHS   predniSONE   40 mg Oral Q breakfast   umeclidinium-vilanterol  1 puff Inhalation Daily   Continuous Infusions:  cefTRIAXone  (ROCEPHIN )  IV Stopped (08/19/24 1741)     LOS: 4 days     Almarie KANDICE Hoots, MD  08/20/2024, 9:05 AM

## 2024-08-20 NOTE — Progress Notes (Signed)
 Report called. Patient's belongings collected (purse, phone, Advertising account planner, Clothing). Patient stable at time of transfer

## 2024-08-20 NOTE — Progress Notes (Unsigned)
 Enrolled patient for a day Zio XT monitor to be mailed to patients home   Nishan to read

## 2024-08-20 NOTE — Progress Notes (Signed)
  Progress Note  Patient Name: Lindsey Williamson Date of Encounter: 08/20/2024 [facility] Cardiologist: Maude Emmer, MD   Interval Summary   Feeling much better.  No cardiovascular complaints. Normal rhythm since the episodes of tachycardia that occurred in the emergency room.  Vital Signs Vitals:   08/20/24 0818 08/20/24 0833 08/20/24 0900 08/20/24 1000  BP:   (!) 140/63 (!) 123/48  Pulse: 85  79 89  Resp: (!) 21  17 (!) 25  Temp:      TempSrc:      SpO2: 90% 98% 95% 96%  Weight:      Height:        Intake/Output Summary (Last 24 hours) at 08/20/2024 1033 Last data filed at 08/20/2024 9171 Gross per 24 hour  Intake 1065.04 ml  Output 350 ml  Net 715.04 ml      08/19/2024    3:10 AM 08/18/2024    3:15 AM 08/16/2024    2:34 PM  Last 3 Weights  Weight (lbs) 160 lb 7.9 oz 154 lb 1.6 oz 146 lb  Weight (kg) 72.8 kg 69.9 kg 66.225 kg      Telemetry/ECG  Normal sinus rhythm since ED- Personally Reviewed  Example of onset and termination of ectopic atrial tachycardia during her ED evaluation (abrupt onset and termination with narrow complex long RP mechanism, subtle change in P wave morphology and longer PR interval,) consistent with ectopic atrial tachycardia    Physical Exam  GEN: No acute distress.   Neck: No JVD Cardiac: RRR, no murmurs, rubs, or gallops.  Respiratory: Clear to auscultation bilaterally. GI: Soft, nontender, non-distended  MS: No edema  Assessment & Plan  Ectopic atrial tachycardia, likely associated with deterioration in pulmonary status. Seems to be tolerating the combination of bisoprolol  and diltiazem .  No evidence of bradycardia or AV block.  Blood pressure in normal range, albeit with a rather low diastolic at times. [facility] will sign off.   The patient is ready for discharge today from a cardiac standpoint. Medication Recommendations:   Bisoprolol  10 mg once daily Diltiazem  sustained-release 360 mg once daily Stop  amlodipine  Continue Jardiance , pravastatin  and aspirin  at previous doses Other recommendations (labs, testing, etc): 2 week ZIO monitor as outpatient (we will make arrangements). Consider purchasing a personal electronic rhythm monitoring device such as a smart watch or Kardia Follow up as an outpatient: Will make arrangements for follow-up visit in 1-2 months.  For questions or updates, please contact  [facility] Please consult [email or web address] for contact info under       Signed, Jerel Balding, MD

## 2024-08-20 NOTE — TOC Initial Note (Signed)
 Transition of Care [facility]) - Initial/Assessment Note    Patient Details  Name: Lindsey Williamson MRN: [id number] Date of Birth: Dec 14, 1951  Transition of [facility]) CM/SW Contact:    Jon ONEIDA Anon, RN Phone Number: 08/20/2024, 10:21 AM  Clinical Narrative:                 NCM met with pt at bedside, introducing NCM role in DC planning. Pt states she lives at home with her spouse. She states she uses 2L O2 at baseline and [employer] supplies her O2 needs. Her husband will provide travel tank at DC. Pt uses RW to assist in ambulation at baseline. She is active with [facility] for [facility] PT and RN services. Cindie with [facility] confirms. Pt will need ROC orders for HHPT/OT/RN at DC. Pt husband will provide transortation at DC. Pt does not voice any SDOH concerns or further DME needs at this time. IP Care Management will continue to follow.      Expected Discharge Plan: Home w Home Health Services Barriers to Discharge: Continued Medical Work up   Patient Goals and CMS Choice Patient states their goals for this hospitalization and ongoing recovery are:: To return home with spouse CMS Medicare.gov Compare Post Acute Care list provided to:: Patient Choice offered to / list presented to : Patient [place] ownership interest in [facility].provided to:: Patient    Expected Discharge Plan and Services In-house Referral: NA Discharge Planning Services: CM Consult Post Acute Care Choice: Home Health, Durable Medical Equipment Living arrangements for the past 2 months: Apartment                 DME Arranged: N/A DME Agency: [employer]       HH Arranged: PT, OT, RN HH Agency: [facility] Date [facility] Agency Contacted: 08/20/24 Time HH Agency Contacted: 1015 Representative spoke with at [facility] Agency: Cindie Sillmon  Prior Living Arrangements/Services Living arrangements for the past 2 months: Apartment Lives with:: Spouse Patient language and need for interpreter  reviewed:: Yes Do you feel safe going back to the place where you live?: Yes      Need for Family Participation in Patient Care: Yes (Comment) Care giver support system in place?: Yes (comment) Current home services: DME, Home RN, Home PT Criminal Activity/Legal Involvement Pertinent to Current Situation/Hospitalization: No - Comment as needed  Activities of Daily Living   ADL Screening (condition at time of admission) Independently performs ADLs?: Yes (appropriate for developmental age) Is the patient deaf or have difficulty hearing?: No Does the patient have difficulty seeing, even when wearing glasses/contacts?: No Does the patient have difficulty concentrating, remembering, or making decisions?: No  Permission Sought/Granted Permission sought to share information with : Family Supports, Oceanographer granted to share information with : Yes, Verbal Permission Granted  Share Information with NAME: Carisa, Backhaus (Spouse)  [phone]  Permission granted to share info w AGENCY: [facility]        Emotional Assessment Appearance:: Appears stated age Attitude/Demeanor/Rapport: Engaged, Gracious Affect (typically observed): Accepting, Appropriate Orientation: : Oriented to Self, Oriented to  Time, Oriented to Place, Oriented to Situation Alcohol / Substance Use: Not Applicable Psych Involvement: No (comment)  Admission diagnosis:  COPD exacerbation (HCC) [J44.1] Hemoptysis [R04.2] Dyspnea, unspecified type [R06.00] Patient Active Problem List   Diagnosis Date Noted   COPD exacerbation (HCC) 08/16/2024   Hypomagnesemia 08/16/2024   Prolonged QT interval 08/16/2024   Atrial tachycardia (HCC) 08/16/2024  Hemoptysis 05/18/2024   Pneumonitis 04/06/2024   Acute on chronic hypoxic respiratory failure (HCC) 04/06/2024   ILD (interstitial lung disease) (HCC) 04/06/2024   COPD (chronic obstructive pulmonary disease) (HCC) 04/06/2024   Hypoxia  02/13/2024   Encounter for antineoplastic immunotherapy 02/09/2024   Encounter for antineoplastic chemotherapy 11/15/2023   Goals of care, counseling/discussion 10/25/2023   Dyspnea 10/18/2023   Angioedema 10/17/2023   Bleeding per rectum 07/30/2022   Diverticular disease of colon 07/30/2022   First degree hemorrhoids 07/30/2022   Hyperglycemia due to type 2 diabetes mellitus (HCC) 07/30/2022   Primary cancer of right lower lobe of lung (HCC) 04/08/2022   Lung nodule 03/22/2022   Dizziness 05/19/2020   Allergic rhinitis with a predominant nonallergic component. 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   Tobacco abuse 12/16/2014   Hyperlipidemia 12/12/2013   Hypertension 12/12/2013   PCP:  [name], MD Pharmacy:   [facility] [phone] [address], [place] - [address] AT [facility] OF [facility] [address] [place] [address] Phone: [phone] Fax: [phone]     Social Drivers of Health (SDOH) Social History: SDOH Screenings   Food Insecurity: No Food Insecurity (08/17/2024)  Housing: Low Risk  (08/17/2024)  Transportation Needs: No Transportation Needs (08/17/2024)  Utilities: Not At Risk (08/17/2024)  Depression (PHQ2-9): Low Risk  (07/23/2024)  Financial Resource Strain: Low Risk  (04/24/2024)   Received from [facility]  Physical Activity: Inactive (04/24/2024)   [facility] from [facility]  Social Connections: Moderately Integrated (08/17/2024)  Stress: Stress Concern Present (04/24/2024)   Received from [facility]  Tobacco Use: Medium Risk  (08/16/2024)   SDOH Interventions:     Readmission Risk Interventions    08/20/2024   10:10 AM 02/18/2024    9:51 AM  Readmission Risk Prevention Plan  Transportation Screening Complete Complete  PCP or Specialist Appt within 3-5 Days  Complete  HRI or Home Care Consult  Complete  Social Work Consult for Recovery Care Planning/Counseling  Complete  Palliative Care Screening  Not Applicable  Medication Review Oceanographer) Complete Complete  PCP or Specialist appointment within 3-5 days of discharge Complete   HRI or Home Care Consult Complete   SW Recovery Care/Counseling Consult Complete   Palliative Care Screening Not Applicable   Skilled Nursing Facility Not Applicable

## 2024-08-20 NOTE — Telephone Encounter (Signed)
 Hi Shelly can you please help me organize 2-week nonlive monitor for ectopic atrial tachycardia please.  Dr. Delford  to read.

## 2024-08-21 ENCOUNTER — Inpatient Hospital Stay (HOSPITAL_COMMUNITY)

## 2024-08-21 ENCOUNTER — Other Ambulatory Visit

## 2024-08-21 ENCOUNTER — Ambulatory Visit

## 2024-08-21 DIAGNOSIS — J90 Pleural effusion, not elsewhere classified: Secondary | ICD-10-CM | POA: Diagnosis not present

## 2024-08-21 DIAGNOSIS — J441 Chronic obstructive pulmonary disease with (acute) exacerbation: Secondary | ICD-10-CM | POA: Diagnosis not present

## 2024-08-21 DIAGNOSIS — C3431 Malignant neoplasm of lower lobe, right bronchus or lung: Secondary | ICD-10-CM | POA: Diagnosis not present

## 2024-08-21 DIAGNOSIS — R042 Hemoptysis: Secondary | ICD-10-CM | POA: Diagnosis not present

## 2024-08-21 LAB — CBC
HCT: 30.8 % — ABNORMAL LOW (ref 36.0–46.0)
Hemoglobin: 9 g/dL — ABNORMAL LOW (ref 12.0–15.0)
MCH: 25.2 pg — ABNORMAL LOW (ref 26.0–34.0)
MCHC: 29.2 g/dL — ABNORMAL LOW (ref 30.0–36.0)
MCV: 86.3 fL (ref 80.0–100.0)
Platelets: 562 K/uL — ABNORMAL HIGH (ref 150–400)
RBC: 3.57 MIL/uL — ABNORMAL LOW (ref 3.87–5.11)
RDW: 18.2 % — ABNORMAL HIGH (ref 11.5–15.5)
WBC: 10.9 K/uL — ABNORMAL HIGH (ref 4.0–10.5)
nRBC: 0 % (ref 0.0–0.2)

## 2024-08-21 LAB — COMPREHENSIVE METABOLIC PANEL WITH GFR
ALT: 7 U/L (ref 0–44)
AST: 11 U/L — ABNORMAL LOW (ref 15–41)
Albumin: 2.4 g/dL — ABNORMAL LOW (ref 3.5–5.0)
Alkaline Phosphatase: 62 U/L (ref 38–126)
Anion gap: 9 (ref 5–15)
BUN: 15 mg/dL (ref 8–23)
CO2: 25 mmol/L (ref 22–32)
Calcium: 9 mg/dL (ref 8.9–10.3)
Chloride: 104 mmol/L (ref 98–111)
Creatinine, Ser: 0.55 mg/dL (ref 0.44–1.00)
GFR, Estimated: >60 mL/min (ref >60)
Glucose, Bld: 139 mg/dL — ABNORMAL HIGH (ref 70–99)
Potassium: 3.7 mmol/L (ref 3.5–5.1)
Sodium: 138 mmol/L (ref 135–145)
Total Bilirubin: 0.3 mg/dL (ref 0.0–1.2)
Total Protein: 6.4 g/dL — ABNORMAL LOW (ref 6.5–8.1)

## 2024-08-21 LAB — GLUCOSE, CAPILLARY
Glucose-Capillary: 131 mg/dL — ABNORMAL HIGH (ref 70–99)
Glucose-Capillary: 149 mg/dL — ABNORMAL HIGH (ref 70–99)
Glucose-Capillary: 271 mg/dL — ABNORMAL HIGH (ref 70–99)
Glucose-Capillary: 319 mg/dL — ABNORMAL HIGH (ref 70–99)
Glucose-Capillary: 331 mg/dL — ABNORMAL HIGH (ref 70–99)
Glucose-Capillary: 368 mg/dL — ABNORMAL HIGH (ref 70–99)
Glucose-Capillary: 394 mg/dL — ABNORMAL HIGH (ref 70–99)

## 2024-08-21 LAB — BODY FLUID CULTURE W GRAM STAIN: Culture: NO GROWTH

## 2024-08-21 MED ORDER — FUROSEMIDE 10 MG/ML IJ SOLN
40.0000 mg | Freq: Once | INTRAMUSCULAR | Status: AC
  Administered 2024-08-21: 40 mg via INTRAVENOUS
  Filled 2024-08-21: qty 4

## 2024-08-21 MED ORDER — LIDOCAINE-EPINEPHRINE 1 %-1:100000 IJ SOLN
INTRAMUSCULAR | Status: AC
  Filled 2024-08-21: qty 1

## 2024-08-21 MED ORDER — ORAL CARE MOUTH RINSE
15.0000 mL | OROMUCOSAL | Status: DC
  Administered 2024-08-21 – 2024-08-25 (×13): 15 mL via OROMUCOSAL

## 2024-08-21 MED ORDER — ORAL CARE MOUTH RINSE: 15.0000 mL | OROMUCOSAL | Status: DC | PRN

## 2024-08-21 MED ORDER — IPRATROPIUM-ALBUTEROL 0.5-2.5 (3) MG/3ML IN SOLN
3.0000 mL | Freq: Four times a day (QID) | RESPIRATORY_TRACT | Status: DC
  Administered 2024-08-21 – 2024-08-25 (×16): 3 mL via RESPIRATORY_TRACT
  Filled 2024-08-21 (×16): qty 3

## 2024-08-21 NOTE — Progress Notes (Signed)
 RT called to room 1421 for pt wheezing and WOB after thoracentesis. RT gave Albuterol  PRN, chest x-ray pending. No further orders for RT at this time.

## 2024-08-21 NOTE — Progress Notes (Signed)
 PT Cancellation Note  Patient Details Name: Lindsey Williamson MRN: [id number] DOB: [birth year]   Cancelled Treatment:    Reason Eval/Treat Not Completed: Fatigue/lethargy limiting ability to participate  Reports she  feels weak, states to have TC soon. Darice Potters PT Acute Rehabilitation Services Office [phone]  Potters Darice Norris 08/21/2024, 2:18 PM

## 2024-08-21 NOTE — Progress Notes (Signed)
   08/21/24 0722  Oxygen Therapy/Pulse Ox  O2 Device Nasal Cannula  O2 Therapy Oxygen humidified  O2 Flow Rate (L/min) 3 L/min  FiO2 (%) 32 %  SpO2 (!) (S)  80 % (Initiated [place] @ 7 L . Notified MD.)

## 2024-08-21 NOTE — Progress Notes (Signed)
 NAME:  Lindsey Williamson, MRN:  [id number], DOB:  07-15-1951, LOS: 5 ADMISSION DATE:  08/16/2024, CONSULTATION DATE: 08/17/2024 REFERRING MD: Dr. 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  Followed in our office by Dr Neda, chronic hypoxemic respiratory failure on continuous O2.  Repeat bronchoscopy 05/19/2024 for hemoptysis showed an endobronchial lesion in the bronchus intermedius extending to the right lower lobe with associated bleeding.  Followed conservatively. She underwent chemoradiation with right upper lobe XRT, and then Imfinzi  complicated by associated pneumonitis June 2025 treated with steroids.  Most recent imaging included PET scan 07/16/2024 that showed progressive disease in the right lower lobe with associated complete atelectasis, contralateral left lower lobe metastasis (versus primary), progressive hilar and mediastinal adenopathy.  Current therapy is carboplatin /Paclitaxol + Keytruda .  Has continued to have cough, tried Hydromet with only partial success.  Her treatment was delayed and she was treated for possible bronchitis with levofloxacin  08/13/2024. Admitted now with progressive dyspnea, increased hemoptysis since the May hospitalization and bronchoscopy.  In the emergency department she received TXA nebs. She describes bloody mucous, at it's heaviest about 1/2 a cup on day prior to presentation. Usually bright red, sometimes dark spotty clots. Last time she saw any was this am - scant dark blood (after the TXA).    Pertinent  Medical History   Past Medical History:  Diagnosis Date   Angioedema 10/17/2023   Arthritis    Diabetes mellitus without complication (HCC)    Hypertension    Significant Hospital Events: Including  procedures, antibiotic start and stop dates in addition to other pertinent events   8/21 CT-PA >> no pulmonary embolism, extensive subcarinal and right hilar adenopathy that appears to extend and occlude the right lower lobe bronchus proximally.  3.7 cm left lower lobe mass.  Moderate right pleural effusion with associated right upper lobe atelectasis 8/24 thoracentesis for 500 amber-colored fluid, exudative  Interim History / Subjective:   No acute events overnight She is more short of breath and having increasing cough today We discussed having PleurX catheter placed vs having repeat thoracentesis performed. She preferred to have repeat thoracentesis and wait on pleurX.   Objective    Blood pressure (!) 142/83, pulse (!) 101, temperature 98.2 F (36.8 C), temperature source Oral, resp. rate (!) 22, height 5' 4 (1.626 m), weight 72.8 kg, SpO2 93%.    FiO2 (%):  [32 %] 32 %   Intake/Output Summary (Last 24 hours) at 08/21/2024 1647 Last data filed at 08/21/2024 9076 Gross per 24 hour  Intake --  Output 300 ml  Net -300 ml   Filed Weights   08/16/24 1434 08/18/24 0315 08/19/24 0310  Weight: 66.2 kg 69.9 kg 72.8 kg    Examination: General: Chronically ill-appearing, comfortable, on oxygen supplementation HENT: Moist oral mucosa Lungs: decreased breath sounds right base, no wheezing Cardiovascular: S1-S2 appreciated Abdomen: Soft, bowel sounds appreciated Extremities: No clubbing, no edema Neuro: Awake alert moving extremities appropriately GU:   Resolved problem list   Assessment and Plan   Patient with stage IIIa squamous cell lung cancer diagnosed October 2024 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 Sherrod, wishes to discuss future with Oncology team while in patient - On palliative systemic chemotherapy with carboplatin  and  paclitaxel , Keytruda   Large right pleural  effusion Post-thoracentesis 8/23 with removed Exudative effusion - Cytology is suspicious for malignant cells - Discussed PleurX placement today vs Thoracentesis. She prefers thoracentesis at this time. IR consult place  Hemoptysis Improved with tranexamic acid  -s/p tranexamic acid  every 8hr for 2 days, completed 8/24 - continue to monitor - Options for management of the hemoptysis may have to include bronchial artery embolization if she continues to bleed significantly despite ongoing treatment - Flexible bronchoscopy will likely not be of any benefit as we already know she does have endobronchial involvement  Chronic obstructive pulmonary disease/emphysema Chronic respiratory failure - Continue oxygen supplementation, goal SpO2 92% or greater - Continue bronchodilator treatment  Best Practice (right click and Reselect all SmartList Selections daily)   Per Primary service  Labs   CBC: Recent Labs  Lab 08/16/24 1529 08/17/24 0349 08/18/24 0321 08/19/24 0301 08/20/24 0754 08/21/24 0503  WBC 12.4* 12.5* 12.9* 11.3* 9.5 10.9*  NEUTROABS 10.0*  --   --  8.8* 7.0  --   HGB 9.7* 9.0* 8.9* 9.2* 8.3* 9.0*  HCT 33.6* 30.8* 31.1* 32.2* 29.1* 30.8*  MCV 87.0 88.5 87.9 88.5 87.1 86.3  PLT 500* 474* 495* 560* 498* 562*    Basic Metabolic Panel: Recent Labs  Lab 08/16/24 2002 08/17/24 0349 08/17/24 1402 08/18/24 0321 08/19/24 0301 08/20/24 0754 08/21/24 0503  NA  --  133* 142 140 140 138 138  K  --  4.1 3.8 3.6 3.2* 4.0 3.7  CL  --  102 106 105 105 105 104  CO2  --  12* 16* 23 22 24 25   GLUCOSE  --  231* 200* 245* 192* 147* 139*  BUN  --  12 14 16 11 16 15   CREATININE  --  0.67 0.80 0.78 0.47 0.60 0.55  CALCIUM   --  8.8* 9.1 8.6* 8.8* 8.7* 9.0  MG 1.5* 2.0  --  1.7 1.6* 1.9  --   PHOS 3.0 3.5  --  3.1 1.9* 3.2  --    GFR: Estimated Creatinine Clearance: 60.3 mL/min (by C-G formula based on SCr of 0.55 mg/dL). Recent Labs  Lab 08/16/24 2002 08/17/24 0349  08/18/24 0321 08/19/24 0301 08/20/24 0754 08/21/24 0503  WBC  --    < > 12.9* 11.3* 9.5 10.9*  LATICACIDVEN 1.1  --   --   --   --   --    < > = values in this interval not displayed.    Liver Function Tests: Recent Labs  Lab 08/16/24 1529 08/17/24 0349 08/18/24 1142 08/19/24 0301 08/20/24 0754 08/21/24 0503  AST 15 11*  --  12* 27 11*  ALT <5 5  --  6 9 7   ALKPHOS 82 75  --  67 61 62  BILITOT 0.9 1.2  --  0.3 0.5 0.3  PROT 8.2* 7.5 6.8 6.6 6.4* 6.4*  ALBUMIN 2.8* 2.6*  --  2.6* 2.4* 2.4*   No results for input(s): LIPASE, AMYLASE in the last 168 hours. No results for input(s): AMMONIA in the last 168 hours.  ABG    Component Value Date/Time   HCO3 19.8 (L) 08/16/2024 1612   ACIDBASEDEF 4.0 (H) 08/16/2024 1612   O2SAT 36.5 08/16/2024 1612     Coagulation Profile: Recent Labs  Lab 08/16/24 1612  INR 1.0    Cardiac Enzymes: Recent Labs  Lab 08/16/24 2002  CKTOTAL 27*    HbA1C: Hgb A1c MFr Bld  Date/Time Value Ref Range Status  08/17/2024 04:21 AM 9.3 (H) 4.8 - 5.6 % Final    Comment:    (NOTE) Diagnosis of Diabetes The following HbA1c ranges recommended by the American Diabetes Association (ADA) may be used as an aid in the diagnosis of diabetes mellitus.  Hemoglobin             Suggested A1C NGSP%              Diagnosis  <5.7                   Non Diabetic  5.7-6.4                Pre-Diabetic  >6.4                   Diabetic  <7.0                   Glycemic control for                       adults with diabetes.    02/16/2024 05:59 AM 7.4 (H) 4.8 - 5.6 % Final    Comment:    (NOTE) Pre diabetes:          5.7%-6.4%  Diabetes:              >6.4%  Glycemic control for   <7.0% adults with diabetes     CBG: Recent Labs  Lab 08/21/24 0011 08/21/24 0404 08/21/24 0751 08/21/24 1206 08/21/24 1637  GLUCAP 368* 131* 149* 271* 319*    Dorn Chill, MD [facility] Office: [phone]   See Amion  for personal pager PCCM on call pager [phone] until 7pm. Please call Elink 7p-7a. [phone]

## 2024-08-21 NOTE — Progress Notes (Signed)
 Progress Note    Lindsey Williamson   FMW:[id number]  DOB: 05-May-1951  DOA: 08/16/2024     5 PCP: [name], MD  Initial CC: SOB, hemoptysis  Hospital Course:  73 y.o. female with a hx of RLL cancer, ILD, COPD, DM2, CAD, HTN, HLD, chronic respiratory failure who presented with Hemoptysis and progressive dyspnea since her prior hospitalization.  She was treated with TXA nebs with improvement in hemoptysis.  She was also found to have worsening pleural effusion status post thoracentesis awaiting studies.  Consulted pulmonary and cardiology.    Assessment and Plan:  Hemoptysis -  Improved with TXA nebs and Most likely secondary to underlying malignancy - Seen by PCCM/pulmonary;  - Currently no intervention but if worsens they Recommended to consultation with IR to see if there is a target bronchial artery that could be embolized and ablated. Other option would be discussion with thoracic surgery regarding possible APC or laser via bronchoscopy. Pulmonary feels Standard bronchoscopy would not really offer much in the way of hemostasis, although could treat tumor with epinephrine  for some temporary effect -risk probably not worth the low benefit. Cryotherapy by bronchoscopy would allow some tumor debridement but would probably not decrease frequency of bleeding. - Continuing TXA every 8 hours per pulmonary and is continues to improve. PT/OT recommending Home Health when stable for D/C  COPD exacerbation ILD Chronic respiratory failure on 2 L at home - Continue supplemental O2 inhalation, C/w Empiric antibiotics ceftriaxone . IV Solu-Medrol  followed by tapering Prednisone  (Now on 40 mg Prednisone ), DuoNeb every 6 hourly changed to TID and continuing Umeclidinium-Vilanterol 1 puff IH Daily; C/w Xopenex  0.63 mg Neb q4hprn Wheezing; C/w Guaifenesin  600 mg p.o. twice daily   Sinus tachycardia versus atrial tachycardia: Continue to monitor on telemetry Seen by cardiology they feel that her heart  rate is improved with improvement in her pulm status.  They believe this is most likely sinus tachycardia but she may have had a brief atrial tachycardia episode in the ED. - Tolerating bisoprolol  and Cardizem .  Amlodipine  has been discontinued -Zio patch being planned for at discharge   Right Sided Pleural Effusion -  Pulmonary pursing Thoracentesis for further evaluation and she had 500 mL off. They note that this is likely related to a neoplastic process however this not been proven cytologically. -  Awaiting Cytological Studies.  Pulmonary feels that it is essential to confirm malignant effusion as cause of nonmalignant effusion may be related to postobstructive pneumonia radiation or chemo changes or other issues such as trapped lung or infection.  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 Follows with Dr. Sherrod and currently on palliative systemic chemotherapy with carboplatin  and paclitaxel  as well as Keytruda    Hypokalemia Hypophosphatemia Hypomagnesemia -Repleted  Normocytic Anemia - Hgb/Hct Trend relatively stable and last check was 9.2/32.2 w/ MCV of 88.5. Checked Anemia Panel and showed iron level of 27, TIBC 194, TIBC 221, saturation of 12%, ferritin 1169, vitamin B12 1832.  CTM for S/Sx of Bleeding; No overt bleeding noted   Diabetes Mellitus Type 2 - Hemoglobin A1c 9.3, elevated; At risk for hyperglycemia due to steroid - NovoLog   sliding scale and Monitor CBG, diabetic diet   CAD HLD  Continue statin; Held aspirin  for now due to hemoptysis   Anxiety -Atarax  25 mg p.o. 3 times daily  Overweight: Complicates overall prognosis  and care. Estimated body mass index is 27.55 kg/m as calculated from the following:   Height as of this encounter: 5' 4 (1.626 m).   Weight as of this encounter: 72.8 kg. Weight Loss and Dietary Counseling given  Interval History:  No events overnight but this morning became more dyspneic and required increase in O2 up to 7L salter high flow.  Anxious appearing but comfortable when seen on rounds.   Old records reviewed in assessment of this patient  Antimicrobials: Rocephin  08/16/2024 >> current  DVT prophylaxis:  SCDs Start: 08/16/24 2332   Code Status:   Code Status: Full Code  Mobility Assessment (Last 72 Hours)     Mobility Assessment     Row Name 08/21/24 [phone] 08/20/24 2113 08/20/24 1445 08/20/24 0900 08/19/24 2000   Does the patient have exclusion criteria? No - Perform mobility assessment No - Perform mobility assessment No - Perform mobility assessment No - Perform mobility assessment No - Perform mobility assessment   What is the highest level of mobility based on the mobility assessment? Level 4 (Ambulates with assistance) - Balance while stepping forward/back - Complete Level 2 (Chairfast) - Balance while sitting on edge of bed and cannot stand Level 4 (Ambulates with assistance) - Balance while stepping forward/back - Complete Level 4 (Ambulates with assistance) - Balance while stepping forward/back - Complete Level 4 (Ambulates with assistance) - Balance while stepping forward/back - Complete   Is the above level different from baseline mobility prior to current illness? Yes - Recommend PT order -- Yes - Recommend PT order Yes - Recommend PT order Yes - Recommend PT order    Row Name 08/19/24 1557 08/19/24 1206 08/19/24 0708 08/18/24 2000     Does the patient have exclusion criteria? -- -- No - Perform mobility assessment No - Perform mobility assessment    What is the highest level of mobility based on the mobility assessment? Level 4 (Ambulates with assistance) -  Balance while stepping forward/back - Complete Level 4 (Ambulates with assistance) - Balance while stepping forward/back - Complete Level 3 (Stands with assistance) - Balance while standing  and cannot march in place Level 2 (Chairfast) - Balance while sitting on edge of bed and cannot stand    Is the above level different from baseline mobility prior to current illness? -- -- Yes - Recommend PT order Yes - Recommend PT order       Barriers to discharge: None Disposition Plan: Home HH orders placed: TBD Status is: Inpatient  Objective: Blood pressure (!) 146/79, pulse 81, temperature 98 F (36.7 C), temperature source Oral, resp. rate (!) 21, height 5' 4 (1.626 m), weight 72.8 kg, SpO2 97%.  Examination:  Physical Exam Constitutional:      Appearance: Normal appearance.  HENT:     Head: Normocephalic and atraumatic.     Mouth/Throat:     Mouth: Mucous membranes are moist.  Eyes:     Extraocular Movements: Extraocular movements intact.  Cardiovascular:     Rate and Rhythm: Normal rate and regular rhythm.  Pulmonary:     Effort: Pulmonary effort is normal. No respiratory distress.     Breath sounds: Examination of the right-middle field reveals decreased breath sounds. Examination of the right-lower field reveals decreased breath sounds. Decreased breath  sounds present. No wheezing.  Abdominal:     General: Bowel sounds are normal. There is no distension.     Palpations: Abdomen is soft.     Tenderness: There is no abdominal tenderness.  Musculoskeletal:        General: Normal range of motion.     Cervical back: Normal range of motion and neck supple.  Skin:    General: Skin is warm and dry.  Neurological:     General: No focal deficit present.     Mental Status: She is alert.  Psychiatric:        Mood and Affect: Mood normal.      Consultants:  Pulmonology   Procedures:    Data Reviewed: Results for orders placed or performed during the hospital encounter of  08/16/24 (from the past 24 hours)  Glucose, capillary     Status: Abnormal   Collection Time: 08/20/24  4:34 PM  Result Value Ref Range   Glucose-Capillary 279 (H) 70 - 99 mg/dL  Glucose, capillary     Status: Abnormal   Collection Time: 08/20/24  8:37 PM  Result Value Ref Range   Glucose-Capillary 387 (H) 70 - 99 mg/dL  Glucose, capillary     Status: Abnormal   Collection Time: 08/21/24 12:11 AM  Result Value Ref Range   Glucose-Capillary 368 (H) 70 - 99 mg/dL  Glucose, capillary     Status: Abnormal   Collection Time: 08/21/24  4:04 AM  Result Value Ref Range   Glucose-Capillary 131 (H) 70 - 99 mg/dL  CBC     Status: Abnormal   Collection Time: 08/21/24  5:03 AM  Result Value Ref Range   WBC 10.9 (H) 4.0 - 10.5 K/uL   RBC 3.57 (L) 3.87 - 5.11 MIL/uL   Hemoglobin 9.0 (L) 12.0 - 15.0 g/dL   HCT 69.1 (L) 63.9 - 53.9 %   MCV 86.3 80.0 - 100.0 fL   MCH 25.2 (L) 26.0 - 34.0 pg   MCHC 29.2 (L) 30.0 - 36.0 g/dL   RDW 81.7 (H) 88.4 - 84.4 %   Platelets 562 (H) 150 - 400 K/uL   nRBC 0.0 0.0 - 0.2 %  Comprehensive metabolic panel     Status: Abnormal   Collection Time: 08/21/24  5:03 AM  Result Value Ref Range   Sodium 138 135 - 145 mmol/L   Potassium 3.7 3.5 - 5.1 mmol/L   Chloride 104 98 - 111 mmol/L   CO2 25 22 - 32 mmol/L   Glucose, Bld 139 (H) 70 - 99 mg/dL   BUN 15 8 - 23 mg/dL   Creatinine, Ser 9.44 0.44 - 1.00 mg/dL   Calcium  9.0 8.9 - 10.3 mg/dL   Total Protein 6.4 (L) 6.5 - 8.1 g/dL   Albumin 2.4 (L) 3.5 - 5.0 g/dL   AST 11 (L) 15 - 41 U/L   ALT 7 0 - 44 U/L   Alkaline Phosphatase 62 38 - 126 U/L   Total Bilirubin 0.3 0.0 - 1.2 mg/dL   GFR, Estimated >39 >39 mL/min   Anion gap 9 5 - 15  Glucose, capillary     Status: Abnormal   Collection Time: 08/21/24  7:51 AM  Result Value Ref Range   Glucose-Capillary 149 (H) 70 - 99 mg/dL  Glucose, capillary     Status: Abnormal   Collection Time: 08/21/24 12:06 PM  Result Value Ref Range   Glucose-Capillary 271 (H) 70 -  99 mg/dL    I  have reviewed pertinent nursing notes, vitals, labs, and images as necessary. I have ordered labwork to follow up on as indicated.  I have reviewed the last notes from staff over past 24 hours. I have discussed patient's care plan and test results with nursing staff, CM/SW, and other staff as appropriate.  Time spent: Greater than 50% of the 55 minute visit was spent in counseling/coordination of care for the patient as laid out in the A&P.   LOS: 5 days   Alm Apo, MD [facility] 08/21/2024, 2:20 PM

## 2024-08-21 NOTE — Inpatient Diabetes Management (Addendum)
 Inpatient Diabetes Program Recommendations  AACE/ADA: New Consensus Statement on Inpatient Glycemic Control (2015)  Target Ranges:  Prepandial:   less than 140 mg/dL      Peak postprandial:   less than 180 mg/dL (1-2 hours)      Critically ill patients:  140 - 180 mg/dL   Lab Results  Component Value Date   GLUCAP 271 (H) 08/21/2024   HGBA1C 9.3 (H) 08/17/2024    Review of Glycemic Control  Latest Reference Range & Units 08/21/24 07:51 08/21/24 12:06  Glucose-Capillary 70 - 99 mg/dL 850 (H) 728 (H)  (H): Data is abnormally high  Diabetes history: DM2 Outpatient Diabetes medications: Jardiance  25 mg every day, Glipizide  5 mg every day, Novolog  0-11 units TID, Metformin XR 1000 mg BID Current orders for Inpatient glycemic control: Novolog  0-9 units Q4H  Inpatient Diabetes Program Recommendations:    Might consider:  Novolog  4 units TID with meals if he consumes at least 50%.    Met with patient at bedside.  Reviewed patient's current A1c of 9.3%. Explained what a A1c is and what it measures. Also reviewed goal A1c with patient, importance of good glucose control @ home, and blood sugar goals. She states her BG has gone up with her cancer treatment and steroids.  Encouraged her to avoid beverages with sugar and be mindful of CHO's.  She verbalizes understanding.     Thank you, Wyvonna Pinal, MSN, CDCES Diabetes Coordinator Inpatient Diabetes Program [phone] (team pager from 8a-5p)

## 2024-08-21 NOTE — Procedures (Signed)
 Ultrasound-guided  therapeutic right thoracentesis performed yielding 750 cc of slightly hazy, yellow fluid. No immediate complications. Follow-up chest x-ray pending.EBL none. Due to persistent pt coughing only the above amount of fluid was removed today.

## 2024-08-22 ENCOUNTER — Ambulatory Visit: Admitting: Pulmonary Disease

## 2024-08-22 ENCOUNTER — Encounter: Payer: Self-pay | Admitting: Internal Medicine

## 2024-08-22 ENCOUNTER — Inpatient Hospital Stay

## 2024-08-22 ENCOUNTER — Inpatient Hospital Stay (HOSPITAL_COMMUNITY)

## 2024-08-22 DIAGNOSIS — J910 Malignant pleural effusion: Secondary | ICD-10-CM

## 2024-08-22 DIAGNOSIS — J441 Chronic obstructive pulmonary disease with (acute) exacerbation: Secondary | ICD-10-CM | POA: Diagnosis not present

## 2024-08-22 DIAGNOSIS — J9601 Acute respiratory failure with hypoxia: Secondary | ICD-10-CM

## 2024-08-22 DIAGNOSIS — J90 Pleural effusion, not elsewhere classified: Secondary | ICD-10-CM | POA: Diagnosis not present

## 2024-08-22 DIAGNOSIS — Z7189 Other specified counseling: Secondary | ICD-10-CM

## 2024-08-22 DIAGNOSIS — J9621 Acute and chronic respiratory failure with hypoxia: Secondary | ICD-10-CM

## 2024-08-22 DIAGNOSIS — Z515 Encounter for palliative care: Secondary | ICD-10-CM

## 2024-08-22 LAB — GLUCOSE, CAPILLARY
Glucose-Capillary: 139 mg/dL — ABNORMAL HIGH (ref 70–99)
Glucose-Capillary: 157 mg/dL — ABNORMAL HIGH (ref 70–99)
Glucose-Capillary: 188 mg/dL — ABNORMAL HIGH (ref 70–99)
Glucose-Capillary: 244 mg/dL — ABNORMAL HIGH (ref 70–99)
Glucose-Capillary: 368 mg/dL — ABNORMAL HIGH (ref 70–99)
Glucose-Capillary: 389 mg/dL — ABNORMAL HIGH (ref 70–99)

## 2024-08-22 LAB — BASIC METABOLIC PANEL WITH GFR
Anion gap: 13 (ref 5–15)
BUN: 15 mg/dL (ref 8–23)
CO2: 24 mmol/L (ref 22–32)
Calcium: 9.1 mg/dL (ref 8.9–10.3)
Chloride: 100 mmol/L (ref 98–111)
Creatinine, Ser: 0.69 mg/dL (ref 0.44–1.00)
GFR, Estimated: >60 mL/min (ref >60)
Glucose, Bld: 157 mg/dL — ABNORMAL HIGH (ref 70–99)
Potassium: 3.7 mmol/L (ref 3.5–5.1)
Sodium: 137 mmol/L (ref 135–145)

## 2024-08-22 LAB — CHOLESTEROL, BODY FLUID: Cholesterol, Fluid: 44 mg/dL

## 2024-08-22 LAB — CBC
HCT: 31.4 % — ABNORMAL LOW (ref 36.0–46.0)
Hemoglobin: 9.3 g/dL — ABNORMAL LOW (ref 12.0–15.0)
MCH: 25.5 pg — ABNORMAL LOW (ref 26.0–34.0)
MCHC: 29.6 g/dL — ABNORMAL LOW (ref 30.0–36.0)
MCV: 86.3 fL (ref 80.0–100.0)
Platelets: 533 K/uL — ABNORMAL HIGH (ref 150–400)
RBC: 3.64 MIL/uL — ABNORMAL LOW (ref 3.87–5.11)
RDW: 18.1 % — ABNORMAL HIGH (ref 11.5–15.5)
WBC: 10.2 K/uL (ref 4.0–10.5)
nRBC: 0.3 % — ABNORMAL HIGH (ref 0.0–0.2)

## 2024-08-22 MED ORDER — CALCIUM CARBONATE ANTACID 500 MG PO CHEW
2.0000 | CHEWABLE_TABLET | Freq: Three times a day (TID) | ORAL | Status: DC | PRN
  Administered 2024-08-22: 400 mg via ORAL
  Filled 2024-08-22: qty 2

## 2024-08-22 MED ORDER — METHYLPREDNISOLONE SODIUM SUCC 40 MG IJ SOLR
40.0000 mg | Freq: Every day | INTRAMUSCULAR | Status: DC
  Administered 2024-08-22 – 2024-08-23 (×2): 40 mg via INTRAVENOUS
  Filled 2024-08-22 (×2): qty 1

## 2024-08-22 MED ORDER — FUROSEMIDE 10 MG/ML IJ SOLN
40.0000 mg | Freq: Once | INTRAMUSCULAR | Status: AC
  Administered 2024-08-22: 40 mg via INTRAVENOUS
  Filled 2024-08-22: qty 4

## 2024-08-22 MED ORDER — MORPHINE SULFATE (PF) 2 MG/ML IV SOLN: 1.0000 mg | INTRAVENOUS | Status: DC | PRN

## 2024-08-22 MED ORDER — CHLORHEXIDINE GLUCONATE CLOTH 2 % EX PADS
6.0000 | MEDICATED_PAD | Freq: Every day | CUTANEOUS | Status: DC
  Administered 2024-08-22 – 2024-08-25 (×3): 6 via TOPICAL

## 2024-08-22 MED ORDER — OXYCODONE HCL 5 MG PO TABS: 5.0000 mg | ORAL_TABLET | ORAL | Status: DC | PRN

## 2024-08-22 NOTE — Plan of Care (Signed)
  Problem: Nutritional: Goal: Maintenance of adequate nutrition will improve Outcome: Progressing   Problem: Skin Integrity: Goal: Risk for impaired skin integrity will decrease Outcome: Progressing   Problem: Respiratory: Goal: Levels of oxygenation will improve Outcome: Progressing   Problem: Education: Goal: Knowledge of General Education information will improve Description: Including pain rating scale, medication(s)/side effects and non-pharmacologic comfort measures Outcome: Progressing   Problem: Clinical Measurements: Goal: Cardiovascular complication will be avoided Outcome: Progressing   Problem: Coping: Goal: Level of anxiety will decrease Outcome: Progressing   Problem: Pain Managment: Goal: General experience of comfort will improve and/or be controlled Outcome: Progressing   Problem: Safety: Goal: Ability to remain free from injury will improve Outcome: Progressing   Problem: Skin Integrity: Goal: Risk for impaired skin integrity will decrease Outcome: Progressing

## 2024-08-22 NOTE — Progress Notes (Signed)
 Progress Note    Lindsey Williamson   FMW:[id number]  DOB: 1951/05/19  DOA: 08/16/2024     6 PCP: [name], MD  Initial CC: SOB, hemoptysis  Hospital Course:  73 y.o. female with a hx of RLL cancer, ILD, COPD, DM2, CAD, HTN, HLD, chronic respiratory failure who presented with Hemoptysis and progressive dyspnea since her prior hospitalization.  She was treated with TXA nebs with improvement in hemoptysis.  She was also found to have worsening pleural effusion status post thoracentesis awaiting studies.  Consulted pulmonary and cardiology.    Assessment and Plan:  Right Sided Pleural Effusion Acute on chronic hypoxic respiratory failure -  Pulmonary pursing Thoracentesis for further evaluation and she had 500 mL off. - Cytology from thoracentesis on 08/18/2024 noted to have malignant cells -Oncology aware with no change recommended and plans for now - Pulmonology discussing potential PleurX catheter - Had further decompensation with respiratory status after repeat thoracentesis on 08/21/2024 - Continue with Lasix  intermittently - Weaning oxygen as able; back to SDU on 8/26 and placed on HHFNC  Hemoptysis - resolved  -  Improved with TXA nebs and Most likely secondary to underlying malignancy - Seen by PCCM/pulmonary - If recurs, recommendation would be for consideration of IR evaluation for possible embolization - s/p TXA  COPD exacerbation ILD - Continue supplemental O2; on 2L at home - Completed Rocephin  course -Completed steroid course -Continue breathing treatments   Sinus tachycardia versus atrial tachycardia: Continue to monitor on telemetry Seen by cardiology they feel that her heart rate is improved with improvement in her pulm status.  They believe this is most likely sinus tachycardia but she may have had a brief atrial tachycardia episode in the ED. - Tolerating bisoprolol  and Cardizem .  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 Follows with Dr. Sherrod and currently on palliative systemic chemotherapy with carboplatin  and paclitaxel  as well as Keytruda    Hypokalemia Hypophosphatemia Hypomagnesemia -Repleting as necessary  Normocytic Anemia - Hgb/Hct Trend relatively stable and last check was 9.2/32.2 w/ MCV of 88.5. Checked Anemia Panel and showed iron level of 27, TIBC 194, TIBC 221, saturation of 12%, ferritin 1169, vitamin B12 1832.  CTM for S/Sx of Bleeding; No overt bleeding noted   Diabetes Mellitus Type 2 - Hemoglobin A1c 9.3, elevated; At risk for hyperglycemia due to steroid - NovoLog  sliding scale and Monitor CBG, diabetic diet   CAD HLD  Continue statin; Held aspirin  for now due to hemoptysis   Anxiety -Atarax  25 mg p.o. 3 times daily  Overweight: Complicates overall prognosis and care. Estimated body mass index is 27.55 kg/m as calculated from the following:   Height as of this encounter: 5' 4 (1.626 m).   Weight as of this encounter: 72.8 kg. Weight Loss and Dietary Counseling given  Interval History:  Patient developed worsening respiratory status yesterday after thoracentesis and we transferred her back to stepdown unit. This morning she is more comfortable on heated high flow and resting comfortably.  Goal will be for oxygen weaning at this time   Old records reviewed in assessment of this patient  Antimicrobials: Rocephin  08/16/2024 >> 08/22/2024  DVT prophylaxis:  SCDs  Start: 08/16/24 2332   Code Status:   Code Status: Full Code  Mobility Assessment (Last 72 Hours)     Mobility Assessment     Row Name 08/22/24 0800 08/21/24 1945 08/21/24 0923 08/20/24 2113 08/20/24 1445    Does the patient have exclusion criteria? No - Perform mobility assessment No - Perform mobility assessment No - Perform mobility assessment No - Perform mobility assessment No - Perform mobility assessment   What is the highest level of mobility based on the mobility assessment? Level 5 (Ambulates independently) - Balance while walking independently - Complete Level 4 (Ambulates with assistance) - Balance while stepping forward/back - Complete Level 4 (Ambulates with assistance) - Balance while stepping forward/back - Complete Level 2 (Chairfast) - Balance while sitting on edge of bed and cannot stand Level 4 (Ambulates with assistance) - Balance while stepping forward/back - Complete   Is the above level different from baseline mobility prior to current illness? No - Consider discontinuing PT/OT -- Yes - Recommend PT order -- Yes - Recommend PT order    Row Name 08/20/24 0900 08/19/24 2000 08/19/24 1557       Does the patient have exclusion criteria? No - Perform mobility assessment No - Perform mobility assessment --     What is the highest level of mobility based on the mobility assessment? Level 4 (Ambulates with assistance) - Balance while stepping forward/back - Complete Level 4 (Ambulates with assistance) - Balance while stepping forward/back - Complete Level 4 (Ambulates with assistance) - Balance while stepping forward/back - Complete     Is the above level different from baseline mobility prior to current illness? Yes - Recommend PT order Yes - Recommend PT order --        Barriers to discharge: None Disposition Plan: Home HH orders placed: TBD Status is: Inpatient  Objective: Blood pressure (!) 158/73, pulse 89, temperature 98.4 F (36.9 C), temperature source Oral, resp. rate (!) 21, height 5' 4 (1.626 m), weight 72.3 kg, SpO2 99%.  Examination:  Physical Exam Constitutional:      Appearance: Normal appearance.  HENT:     Head: Normocephalic and atraumatic.     Mouth/Throat:      Mouth: Mucous membranes are moist.  Eyes:     Extraocular Movements: Extraocular movements intact.  Cardiovascular:     Rate and Rhythm: Normal rate and regular rhythm.  Pulmonary:     Effort: Pulmonary effort is normal. No respiratory distress.     Breath sounds: Examination of the right-middle field reveals decreased breath sounds. Examination of the right-lower field reveals decreased breath sounds. Decreased breath sounds present. No wheezing.  Abdominal:     General: Bowel sounds are normal. There is no distension.     Palpations: Abdomen is soft.     Tenderness: There is no abdominal tenderness.  Musculoskeletal:        General: Normal range of motion.     Cervical back: Normal range of motion and neck supple.  Skin:    General: Skin is warm and dry.  Neurological:     General: No focal deficit present.     Mental Status: She is alert.  Psychiatric:        Mood and Affect: Mood normal.      Consultants:  Pulmonology   Procedures:    Data Reviewed: Results for orders placed or performed during the hospital encounter of 08/16/24 (from the past 24 hours)  Glucose, capillary     Status: Abnormal  Collection Time: 08/21/24  4:37 PM  Result Value Ref Range   Glucose-Capillary 319 (H) 70 - 99 mg/dL  Glucose, capillary     Status: Abnormal   Collection Time: 08/21/24  7:44 PM  Result Value Ref Range   Glucose-Capillary 394 (H) 70 - 99 mg/dL  Glucose, capillary     Status: Abnormal   Collection Time: 08/21/24 11:38 PM  Result Value Ref Range   Glucose-Capillary 331 (H) 70 - 99 mg/dL  Glucose, capillary     Status: Abnormal   Collection Time: 08/22/24  3:21 AM  Result Value Ref Range   Glucose-Capillary 157 (H) 70 - 99 mg/dL  Basic metabolic panel with GFR     Status: Abnormal   Collection Time: 08/22/24  3:22 AM  Result Value Ref Range   Sodium 137 135 - 145 mmol/L   Potassium 3.7 3.5 - 5.1 mmol/L   Chloride 100 98 - 111 mmol/L   CO2 24 22 - 32 mmol/L    Glucose, Bld 157 (H) 70 - 99 mg/dL   BUN 15 8 - 23 mg/dL   Creatinine, Ser 9.30 0.44 - 1.00 mg/dL   Calcium  9.1 8.9 - 10.3 mg/dL   GFR, Estimated >39 >39 mL/min   Anion gap 13 5 - 15  CBC     Status: Abnormal   Collection Time: 08/22/24  3:22 AM  Result Value Ref Range   WBC 10.2 4.0 - 10.5 K/uL   RBC 3.64 (L) 3.87 - 5.11 MIL/uL   Hemoglobin 9.3 (L) 12.0 - 15.0 g/dL   HCT 68.5 (L) 63.9 - 53.9 %   MCV 86.3 80.0 - 100.0 fL   MCH 25.5 (L) 26.0 - 34.0 pg   MCHC 29.6 (L) 30.0 - 36.0 g/dL   RDW 81.8 (H) 88.4 - 84.4 %   Platelets 533 (H) 150 - 400 K/uL   nRBC 0.3 (H) 0.0 - 0.2 %  Glucose, capillary     Status: Abnormal   Collection Time: 08/22/24  7:44 AM  Result Value Ref Range   Glucose-Capillary 139 (H) 70 - 99 mg/dL   Comment 1 Notify RN    Comment 2 Document in Chart     I have reviewed pertinent nursing notes, vitals, labs, and images as necessary. I have ordered labwork to follow up on as indicated.  I have reviewed the last notes from staff over past 24 hours. I have discussed patient's care plan and test results with nursing staff, CM/SW, and other staff as appropriate.  Time spent: Greater than 50% of the 55 minute visit was spent in counseling/coordination of care for the patient as laid out in the A&P.   LOS: 6 days   Alm Apo, MD [facility] 08/22/2024, 12:17 PM

## 2024-08-22 NOTE — Consult Note (Signed)
 Palliative Care Consult Note                                  Date: 08/22/2024   Patient Name: Lindsey Williamson  DOB: [birth year]  MRN: [id number]  Age / Sex: 73 y.o., female  PCP: [name], MD Referring Physician: Patsy Lenis, MD  Reason for Consultation: {Reason for Consult:23484}  Past Medical History:  Diagnosis Date   Angioedema 10/17/2023   Arthritis    Diabetes mellitus without complication (HCC)    Hypertension     Subjective:   This NP Camellia Kays reviewed medical records, received report from team, assessed the patient and then meet at the patient's bedside to discuss diagnosis, prognosis, GOC, EOL wishes disposition and options.  Before meeting with the patient/family, I spent time reviewing the chart notes including ***. I also reviewed vital signs, nursing flowsheets, medication administrations record, labs, and imaging. Labs reviewed include ***.  I met with ***.   We meet to discuss diagnosis prognosis, GOC, EOL wishes, disposition and options. Concept of Palliative Care was introduced as specialized medical care for people and their families living with serious illness.  If focuses on providing relief from the symptoms and stress of a serious illness.  The goal is to improve quality of life for both the patient and the family. Values and goals of care important to patient and family were attempted to be elicited.  Created space and opportunity for patient  and family to explore thoughts and feelings regarding current medical situation   Natural trajectory and current clinical status were discussed. Questions and concerns addressed. Patient  encouraged to call with questions or concerns.    Patient/Family Understanding of Illness: ***  Life Review: ***  Patient Values: ***  Baseline Status: ***  Today's Discussion: ***  Goals: ***  Review of Systems  Objective:   Primary Diagnoses: Present  on Admission:  COPD exacerbation (HCC)  (Resolved) Hemoptysis  Hyperlipidemia  Hypertension  ILD (interstitial lung disease) (HCC)  Primary cancer of right lower lobe of lung (HCC)  Type 2 diabetes mellitus with microalbuminuria (HCC)  Hypomagnesemia  Prolonged QT interval  Ectopic atrial tachycardia (HCC)  (Resolved) Dyspnea  Acute on chronic respiratory failure with hypoxia (HCC)   Vital Signs:  BP 136/60   Pulse 95   Temp 98.4 F (36.9 C) (Oral)   Resp 18   Ht 5' 4 (1.626 m)   Wt 72.3 kg   SpO2 99%   BMI 27.36 kg/m   Physical Exam  Palliative Assessment/Data: ***   Advanced Care Planning:   Existing Vynca/ACP Documentation: ***  Primary Decision Maker: {Primary Decision Fjxzm:78612}  Pertinent diagnosis: ***  The patient and/or family consented to a voluntary Advance Care Planning Conversation in person/over the phone***. Individuals present for the conversation:  Summary of the conversation: ***  Outcome of the conversations and/or documents completed: ***  I spent *** minutes providing separately identifiable ACP services with the patient and/or surrogate decision maker in a voluntary, in-person conversation discussing the patient's wishes and goals as detailed in the above note.  Assessment & Plan:   HPI/Patient Profile: 73 y.o. female  with past medical history of *** admitted on 08/16/2024 with ***.   SUMMARY OF RECOMMENDATIONS   ***  Symptom Management:  ***  Code Status: {Updated Palliative Code Status:33307}  Prognosis:  {Palliative Care Prognosis:23504}  Discharge Planning:  {Palliative dispostion:23505}  Discussed with: ***    Thank you for allowing us  to participate in the care of [name] PMT will continue to support holistically.  Time Total: ***  Detailed review of medical records (labs, imaging, vital signs), medically appropriate exam, discussed with treatment team, counseling and education to patient, family,  & staff, documenting clinical information, medication management, coordination of care  Signed by: Camellia Kays, NP Palliative Medicine Team  Team Phone # [phone] (Nights/Weekends)  08/22/2024, 2:43 PM

## 2024-08-22 NOTE — Progress Notes (Signed)
 Nutrition Follow-up  DOCUMENTATION CODES:   Not applicable  INTERVENTION:  - Carb Modified diet.  - Glucerna Shake po TID, each supplement provides 220 kcal and 10 grams of protein - Encourage intake at all meals and of supplements.  - Monitor weight trends.  NUTRITION DIAGNOSIS:   Increased nutrient needs related to cancer and cancer related treatments as evidenced by estimated needs. 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  Patient in bed eating breakfast at time of visit.  She reports a UBW of 154# and feels her weight has been stable over the past year. Per EMR, no significant changes.   Patient admits she has not been able to eat well the past few weeks due to decreased appetite with cancer treatment. She typically would consume 3 meals a day but since treatment she may only eat 1-2 small meals a day. Thankfully, she has been drinking Glucerna 1-2x/day at home.  Since admission, she reports her appetite remains about the same but she has been ordering 3 meals a day and trying to eat what she can of them. Encouraged patient to continue to do this. There has been no meal intakes documented since 8/23 to assess oral intake.  She also endorses consuming Glucerna around once daily. However, per [facility] documentation she has often been refusing. Encouraged patient to try and consume Glucerna between meals to support intake.     Medications reviewed and include: MVI  Labs reviewed:  HA1C 9.3 Blood Glucose 139-394 x24 hours   NUTRITION - FOCUSED PHYSICAL EXAM:  Patient eating  Diet Order:   Diet Order             Diet Carb Modified Fluid consistency: Thin; Room service appropriate? Yes  Diet effective now                   EDUCATION NEEDS:  No education needs have been identified at this time  Skin:  Skin Assessment: Reviewed RN Assessment  Last BM:  8/26 - type 4  Height:  Ht Readings from Last 1 Encounters:  08/21/24 5' 4 (1.626 m)   Weight:  Wt Readings from Last 1 Encounters:  08/22/24 72.3 kg   Ideal Body Weight:  54.5 kg  BMI:  Body mass index is 27.36 kg/m.  Estimated Nutritional Needs:  Kcal:  1700-2000kcal/day Protein:  85-100g/day Fluid:  1.7-2.0L/day    Trude Ned RD, LDN Contact via Secure Chat.

## 2024-08-22 NOTE — Progress Notes (Signed)
 PT Cancellation Note  Patient Details Name: Lindsey Williamson MRN: [id number] DOB: [birth year]   Cancelled Treatment:    Reason Eval/Treat Not Completed: Medical issues which prohibited therapy  Patient reports that she is too tired, patient on HHFNC  now. Will continue to follow for mobility.  Darice Potters PT Acute Rehabilitation Services Office [phone]  Potters Darice Norris 08/22/2024, 3:05 PM

## 2024-08-22 NOTE — Inpatient Diabetes Management (Signed)
 Inpatient Diabetes Program Recommendations  AACE/ADA: New Consensus Statement on Inpatient Glycemic Control (2015)  Target Ranges:  Prepandial:   less than 140 mg/dL      Peak postprandial:   less than 180 mg/dL (1-2 hours)      Critically ill patients:  140 - 180 mg/dL   Lab Results  Component Value Date   GLUCAP 139 (H) 08/22/2024   HGBA1C 9.3 (H) 08/17/2024    Review of Glycemic Control  Latest Reference Range & Units 08/21/24 19:44 08/21/24 23:38 08/22/24 03:21 08/22/24 07:44  Glucose-Capillary 70 - 99 mg/dL 605 (H) 668 (H) 842 (H) 139 (H)  (H): Data is abnormally high Diabetes history: DM2 Outpatient Diabetes medications: Jardiance  25 mg every day, Glipizide  5 mg every day, Novolog  0-11 units TID, Metformin XR 1000 mg BID Current orders for Inpatient glycemic control: Novolog  0-9 units Q4H Prednisone - complete   Inpatient Diabetes Program Recommendations:    With diet order, consider changing correction to TID and Novolog  3 units TID with meals if he consumes at least 50%.    Thanks, Tinnie Minus, MSN, RNC-OB Diabetes Coordinator [phone] (8a-5p)

## 2024-08-22 NOTE — Progress Notes (Signed)
 NAME:  Lindsey Williamson, MRN:  [id number], DOB:  [birth year], LOS: 6 ADMISSION DATE:  08/16/2024, CONSULTATION DATE: 08/17/2024 REFERRING MD: Dr. 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  Followed in our office by Dr Neda, chronic hypoxemic respiratory failure on continuous O2.  Repeat bronchoscopy 05/19/2024 for hemoptysis showed an endobronchial lesion in the bronchus intermedius extending to the right lower lobe with associated bleeding.  Followed conservatively. She underwent chemoradiation with right upper lobe XRT, and then Imfinzi  complicated by associated pneumonitis June 2025 treated with steroids.  Most recent imaging included PET scan 07/16/2024 that showed progressive disease in the right lower lobe with associated complete atelectasis, contralateral left lower lobe metastasis (versus primary), progressive hilar and mediastinal adenopathy.  Current therapy is carboplatin /Paclitaxol + Keytruda .  Has continued to have cough, tried Hydromet with only partial success.  Her treatment was delayed and she was treated for possible bronchitis with levofloxacin  08/13/2024. Admitted now with progressive dyspnea, increased hemoptysis since the May hospitalization and bronchoscopy.  In the emergency department she received TXA nebs. She describes bloody mucous, at it's heaviest about 1/2 a cup on day prior to presentation. Usually bright red, sometimes dark spotty clots. Last time she saw any was this am - scant dark blood (after the TXA).    Pertinent  Medical History   Past Medical History:  Diagnosis Date   Angioedema 10/17/2023   Arthritis    Diabetes mellitus without complication (HCC)    Hypertension    Significant Hospital Events: Including  procedures, antibiotic start and stop dates in addition to other pertinent events   8/21 CT-PA >> no pulmonary embolism, extensive subcarinal and right hilar adenopathy that appears to extend and occlude the right lower lobe bronchus proximally.  3.7 cm left lower lobe mass.  Moderate right pleural effusion with associated right upper lobe atelectasis 8/24 thoracentesis for 500 amber-colored fluid, exudative 8/26 thoracentesis by IR, removed. Developed re-expansion pulmonary edema post-procedure, transferred back to step down unit  Interim History / Subjective:   No acute events overnight She reports having a good night. Remains on heated high flow nasal canula.  She is open to palliative care consult. No treatment plan changes per Dr. Sherrod.  Objective    Blood pressure (!) 151/75, pulse (!) 101, temperature 98 F (36.7 C), temperature source Axillary, resp. rate 18, height 5' 4 (1.626 m), weight 72.3 kg, SpO2 92%.    FiO2 (%):  [45 %-80 %] 45 %   Intake/Output Summary (Last 24 hours) at 08/22/2024 9187 Last data filed at 08/22/2024 0520 Gross per 24 hour  Intake --  Output 500 ml  Net -500 ml   Filed Weights   08/19/24 0310 08/21/24 1847 08/22/24 0500  Weight: 72.8 kg 70.8 kg 72.3 kg    Examination: General: Chronically ill-appearing, comfortable, on HHFNC HENT: Moist oral mucosa Lungs: decreased breath sounds right base, audible upper airway secretions Cardiovascular: S1-S2 appreciated Abdomen: Soft, bowel sounds appreciated Extremities: No clubbing, no edema Neuro: Awake alert moving extremities appropriately GU:   Resolved problem list   Assessment and Plan   Acute Hypoxemic respiratory Failure - continue heated high flow oxygen, goal SpO2 92% or higher - in setting of lung cancer, pleural effusion and re-expansion pulmonary edema  Patient with stage IV squamous cell lung cancer  diagnosed October 2024 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 No changes in plan per Oncology team.  - Palliative care consulted  Malignant Large right pleural effusion Post-thoracentesis 8/23 with removed Exudative effusion - Cytology is suspicious for malignant cells - Discussed PleurX placement vs Thoracentesis. She is determining whether she wants pleurX. - Thoracentesis 8/26, removed  Hemoptysis Improved with tranexamic acid  -s/p tranexamic acid  every 8hr for 2 days, completed 8/24 - continue to monitor - Options for management of the hemoptysis may have to include bronchial artery embolization if she continues to bleed significantly despite ongoing treatment - Flexible bronchoscopy will likely not be of any benefit as we already know she does have endobronchial involvement  Chronic obstructive pulmonary disease/emphysema Chronic respiratory failure - Continue oxygen supplementation, goal SpO2 92% or greater - Continue bronchodilator treatment  Best Practice (right click and Reselect all SmartList Selections daily)   Per Primary service  Labs   CBC: Recent Labs  Lab 08/16/24 1529 08/17/24 0349 08/18/24 0321 08/19/24 0301 08/20/24 0754 08/21/24 0503 08/22/24 0322  WBC 12.4*   < > 12.9* 11.3* 9.5 10.9* 10.2  NEUTROABS 10.0*  --   --  8.8* 7.0  --   --   HGB 9.7*   < > 8.9* 9.2* 8.3* 9.0* 9.3*  HCT 33.6*   < > 31.1* 32.2* 29.1* 30.8* 31.4*  MCV 87.0   < > 87.9 88.5 87.1 86.3 86.3  PLT 500*   < > 495* 560* 498* 562* 533*   < > = values in this interval not displayed.    Basic Metabolic Panel: Recent Labs  Lab 08/16/24 2002 08/17/24 0349 08/17/24 1402 08/18/24 0321 08/19/24 0301 08/20/24 0754 08/21/24 0503 08/22/24 0322  NA  --  133*   < > 140 140 138 138 137  K  --  4.1   < > 3.6 3.2* 4.0 3.7 3.7  CL  --  102   < > 105 105 105 104 100  CO2  --  12*   < > 23 22 24 25 24   GLUCOSE   --  231*   < > 245* 192* 147* 139* 157*  BUN  --  12   < > 16 11 16 15 15   CREATININE  --  0.67   < > 0.78 0.47 0.60 0.55 0.69  CALCIUM   --  8.8*   < > 8.6* 8.8* 8.7* 9.0 9.1  MG 1.5* 2.0  --  1.7 1.6* 1.9  --   --   PHOS 3.0 3.5  --  3.1 1.9* 3.2  --   --    < > = values in this interval not displayed.   GFR: Estimated Creatinine Clearance: 60.1 mL/min (by C-G formula based on SCr of 0.69 mg/dL). Recent Labs  Lab 08/16/24 2002 08/17/24 0349 08/19/24 0301 08/20/24 0754 08/21/24 0503 08/22/24 0322  WBC  --    < > 11.3* 9.5 10.9* 10.2  LATICACIDVEN 1.1  --   --   --   --   --    < > = values in this interval not displayed.    Liver Function Tests: Recent Labs  Lab 08/16/24 1529 08/17/24 0349 08/18/24 1142 08/19/24 0301 08/20/24 0754 08/21/24 0503  AST 15 11*  --  12* 27 11*  ALT <5 5  --  6 9 7   ALKPHOS 82 75  --  67 61 62  BILITOT 0.9 1.2  --  0.3 0.5 0.3  PROT 8.2* 7.5 6.8 6.6 6.4* 6.4*  ALBUMIN 2.8* 2.6*  --  2.6* 2.4* 2.4*   No results for input(s): LIPASE, AMYLASE in the last 168 hours. No results for input(s): AMMONIA in the last 168 hours.  ABG    Component Value Date/Time   HCO3 19.8 (L) 08/16/2024 1612   ACIDBASEDEF 4.0 (H) 08/16/2024 1612   O2SAT 36.5 08/16/2024 1612     Coagulation Profile: Recent Labs  Lab 08/16/24 1612  INR 1.0    Cardiac Enzymes: Recent Labs  Lab 08/16/24 2002  CKTOTAL 27*    HbA1C: Hgb A1c MFr Bld  Date/Time Value Ref Range Status  08/17/2024 04:21 AM 9.3 (H) 4.8 - 5.6 % Final    Comment:    (NOTE) Diagnosis of Diabetes The following HbA1c ranges recommended by the American Diabetes Association (ADA) may be used as an aid in the diagnosis of diabetes mellitus.  Hemoglobin             Suggested A1C NGSP%              Diagnosis  <5.7                   Non Diabetic  5.7-6.4                Pre-Diabetic  >6.4                   Diabetic  <7.0                   Glycemic control for                        adults with diabetes.    02/16/2024 05:59 AM 7.4 (H) 4.8 - 5.6 % Final    Comment:    (NOTE) Pre diabetes:          5.7%-6.4%  Diabetes:              >6.4%  Glycemic control for   <7.0% adults with diabetes     CBG: Recent Labs  Lab 08/21/24 1637 08/21/24 1944 08/21/24 2338 08/22/24 0321 08/22/24 0744  GLUCAP 319* 394* 331* 157* 139*    Dorn Chill, MD [facility] Office: [phone]   See Amion for personal pager PCCM on call pager [phone] until 7pm. Please call Elink 7p-7a. [phone]

## 2024-08-23 ENCOUNTER — Ambulatory Visit

## 2024-08-23 DIAGNOSIS — J9601 Acute respiratory failure with hypoxia: Secondary | ICD-10-CM | POA: Diagnosis not present

## 2024-08-23 DIAGNOSIS — Z515 Encounter for palliative care: Secondary | ICD-10-CM | POA: Diagnosis not present

## 2024-08-23 DIAGNOSIS — J9621 Acute and chronic respiratory failure with hypoxia: Secondary | ICD-10-CM | POA: Diagnosis not present

## 2024-08-23 DIAGNOSIS — Z789 Other specified health status: Secondary | ICD-10-CM

## 2024-08-23 DIAGNOSIS — Z66 Do not resuscitate: Secondary | ICD-10-CM

## 2024-08-23 DIAGNOSIS — J910 Malignant pleural effusion: Secondary | ICD-10-CM | POA: Diagnosis not present

## 2024-08-23 DIAGNOSIS — J441 Chronic obstructive pulmonary disease with (acute) exacerbation: Secondary | ICD-10-CM | POA: Diagnosis not present

## 2024-08-23 DIAGNOSIS — J90 Pleural effusion, not elsewhere classified: Secondary | ICD-10-CM | POA: Diagnosis not present

## 2024-08-23 LAB — GLUCOSE, CAPILLARY
Glucose-Capillary: 243 mg/dL — ABNORMAL HIGH (ref 70–99)
Glucose-Capillary: 216 mg/dL — ABNORMAL HIGH (ref 70–99)
Glucose-Capillary: 375 mg/dL — ABNORMAL HIGH (ref 70–99)
Glucose-Capillary: 448 mg/dL — ABNORMAL HIGH (ref 70–99)
Glucose-Capillary: 495 mg/dL — ABNORMAL HIGH (ref 70–99)

## 2024-08-23 MED ORDER — INSULIN GLARGINE 100 UNIT/ML ~~LOC~~ SOLN
10.0000 [IU] | Freq: Every day | SUBCUTANEOUS | Status: DC
  Administered 2024-08-23: 10 [IU] via SUBCUTANEOUS
  Filled 2024-08-23: qty 0.1

## 2024-08-23 MED ORDER — INSULIN ASPART 100 UNIT/ML IJ SOLN
0.0000 [IU] | Freq: Three times a day (TID) | INTRAMUSCULAR | Status: DC
  Administered 2024-08-23: 9 [IU] via SUBCUTANEOUS
  Administered 2024-08-23: 3 [IU] via SUBCUTANEOUS

## 2024-08-23 MED ORDER — INSULIN ASPART 100 UNIT/ML IJ SOLN
20.0000 [IU] | INTRAMUSCULAR | Status: AC
  Administered 2024-08-23: 20 [IU] via SUBCUTANEOUS

## 2024-08-23 MED ORDER — FUROSEMIDE 10 MG/ML IJ SOLN
40.0000 mg | Freq: Once | INTRAMUSCULAR | Status: AC
  Administered 2024-08-23: 40 mg via INTRAVENOUS
  Filled 2024-08-23: qty 4

## 2024-08-23 MED ORDER — INSULIN GLARGINE-YFGN 100 UNIT/ML ~~LOC~~ SOPN: 10.0000 [IU] | PEN_INJECTOR | Freq: Every day | SUBCUTANEOUS | Status: DC

## 2024-08-23 MED ORDER — INSULIN ASPART 100 UNIT/ML IJ SOLN
4.0000 [IU] | Freq: Three times a day (TID) | INTRAMUSCULAR | Status: DC
  Administered 2024-08-24 (×2): 4 [IU] via SUBCUTANEOUS

## 2024-08-23 MED ORDER — INSULIN ASPART 100 UNIT/ML IJ SOLN: 0.0000 [IU] | Freq: Every day | INTRAMUSCULAR | Status: DC

## 2024-08-23 MED ORDER — INSULIN GLARGINE 100 UNIT/ML ~~LOC~~ SOLN
18.0000 [IU] | Freq: Every day | SUBCUTANEOUS | Status: DC
  Filled 2024-08-23: qty 0.18

## 2024-08-23 MED ORDER — INSULIN ASPART 100 UNIT/ML IJ SOLN
10.0000 [IU] | Freq: Once | INTRAMUSCULAR | Status: AC
  Administered 2024-08-23: 10 [IU] via SUBCUTANEOUS

## 2024-08-23 MED ORDER — INSULIN GLARGINE 100 UNIT/ML ~~LOC~~ SOLN
8.0000 [IU] | Freq: Once | SUBCUTANEOUS | Status: AC
  Administered 2024-08-23: 8 [IU] via SUBCUTANEOUS
  Filled 2024-08-23: qty 0.08

## 2024-08-23 MED ORDER — INSULIN ASPART 100 UNIT/ML IJ SOLN
0.0000 [IU] | Freq: Three times a day (TID) | INTRAMUSCULAR | Status: DC
  Administered 2024-08-24: 11 [IU] via SUBCUTANEOUS
  Administered 2024-08-24: 20 [IU] via SUBCUTANEOUS

## 2024-08-23 NOTE — Progress Notes (Signed)
 PT Cancellation Note  Patient Details Name: Lindsey Williamson MRN: [id number] DOB: 05-Aug-1951   Cancelled Treatment:    Reason Eval/Treat Not Completed: Medical issues which prohibited therapy Has been  in recliner, not able to progress activity  , on HHFNC. Darice Potters PT Acute Rehabilitation Services Office [phone]   Potters Darice Norris 08/23/2024, 3:57 PM

## 2024-08-23 NOTE — Plan of Care (Signed)
  Problem: Coping: Goal: Ability to adjust to condition or change in health will improve Outcome: Progressing   Problem: Fluid Volume: Goal: Ability to maintain a balanced intake and output will improve Outcome: Progressing   Problem: Nutritional: Goal: Maintenance of adequate nutrition will improve Outcome: Progressing   Problem: Skin Integrity: Goal: Risk for impaired skin integrity will decrease Outcome: Progressing   Problem: Activity: Goal: Ability to tolerate increased activity will improve Outcome: Progressing   Problem: Respiratory: Goal: Levels of oxygenation will improve Outcome: Progressing

## 2024-08-23 NOTE — Progress Notes (Signed)
 RT took pt off HHFNC and placed on HFNC 10L. Pt doing well at this time.

## 2024-08-23 NOTE — Progress Notes (Signed)
 Daily Progress Note   Date: 08/23/2024   Patient Name: Lindsey Williamson  DOB: 1950-12-31  MRN: [id number]  Age / Sex: 73 y.o., female  Attending Physician: Patsy Lenis, MD Primary Care Physician: [name], MD Admit Date: 08/16/2024 Length of Stay: 7 days  Reason for Follow-up: Establishing goals of care  Past Medical History:  Diagnosis Date   Angioedema 10/17/2023   Arthritis    Diabetes mellitus without complication (HCC)    Hypertension     Subjective:   Subjective: Chart Reviewed. Updates received. Patient Assessed. Created space and opportunity for patient  and family to explore thoughts and feelings regarding current medical situation.  Today's Discussion: Today before meeting with the patient/family, I reviewed the chart notes including nursing note from today, PCCM note from today, hospitalist note from today. I also reviewed vital signs, nursing flowsheets, medication administrations record, labs, and imaging.  No significant labs ordered today other than serial CBGs.  Today I saw the patient the bedside, her husband was present.  We met for planned 11:00 AM family meeting.  The patient's pastor showed up to participate after a few minutes as well.  Overall today she feels better than she did yesterday.  Noticeably she is off heated high flow nasal cannula and is down to 4 to 6 L per nasal cannula.  She is sitting in the bedside chair where yesterday she was confined to the bed due to activity intolerance.  She is not appearing tachypneic or in any respiratory distress.  We spent time talking about options moving forward.  We discussed the pathophysiology behind her lung cancer, fluid accumulation, the uncurable state of her cancer.  She is currently getting palliative chemotherapy and had 1 treatment.  We spent time talking about options for removing fluid moving forward.  We discussed PleurX drain placement versus repeated thoracentesis.  After discussion the patient  is clear that she would like to try repeated thoracentesis rather than PleurX placement.  Her husband is in agreement.  The patient also seems to indicate that she does not want to have a life moving back and forth to the hospital.  Her palliative chemo she felt she tolerated okay but then felt pretty bad a week or 2 later.  We discussed that it is difficult to tell whether this is a manifestation of the chemotherapy versus just fluid accumulation and lung collapse coincidentally soon after her chemotherapy.  After a long time discussing options the patient would like to continue attempts at palliative chemotherapy for more time and repeated thoracentesis to keep pleural effusions under control.  She is clear that if she does not tolerate these treatments that she can change gears and make a different decision down the road.  I offered, and she accepted, referral to outpatient palliative care and a cancer center with Levon Borer, NP.  Finally we talked about CODE STATUS and the patient was quite clear that she does not want resuscitation and agrees to DNR/DNI.  I shared that goals are clear at this time and palliative medicine would back off.  We are available for any significant needs moving forward.  I provided emotional and general support through therapeutic listening, empathy, sharing of stories, therapeutic touch, and other techniques. I answered all questions and addressed all concerns to the best of my ability.  Review of Systems  Constitutional:  Positive for fatigue.       Denies pain in general  Respiratory:  Negative for shortness of breath (Significantly  improved).   Gastrointestinal:  Negative for abdominal pain, nausea and vomiting.    Objective:   Primary Diagnoses: Present on Admission:  COPD exacerbation (HCC)  (Resolved) Hemoptysis  Hyperlipidemia  Hypertension  ILD (interstitial lung disease) (HCC)  Primary cancer of right lower lobe of lung (HCC)  Type 2  diabetes mellitus with microalbuminuria (HCC)  Hypomagnesemia  Prolonged QT interval  Ectopic atrial tachycardia (HCC)  (Resolved) Dyspnea  Acute on chronic respiratory failure with hypoxia (HCC)   Vital Signs:  BP (!) 106/53   Pulse 91   Temp 97.9 F (36.6 C) (Oral)   Resp 19   Ht 5' 4 (1.626 m)   Wt 73.5 kg   SpO2 97%   BMI 27.81 kg/m   Physical Exam Vitals and nursing note reviewed.  Constitutional:      General: She is not in acute distress.    Appearance: She is ill-appearing. She is not toxic-appearing.  HENT:     Head: Normocephalic and atraumatic.  Cardiovascular:     Rate and Rhythm: Normal rate.  Pulmonary:     Effort: Pulmonary effort is normal. No respiratory distress.  Abdominal:     General: Abdomen is flat.     Palpations: Abdomen is soft.  Skin:    General: Skin is warm and dry.  Neurological:     General: No focal deficit present.     Mental Status: She is alert and oriented to person, place, and time. Mental status is at baseline.  Psychiatric:        Mood and Affect: Mood normal.        Behavior: Behavior normal.     Palliative Assessment/Data: 50-60%   Advanced Care Planning:   Existing Vynca/ACP Documentation: Goals of care signed 07/17/2024  Primary Decision Maker: PATIENT  Pertinent diagnosis: Stage IV small cell lung cancer, hemoptysis, COPD, acute on chronic respiratory failure requiring advanced oxygen  The patient and/or family consented to a voluntary Advance Care Planning Conversation in person. Individuals present for the conversation: The patient, her husband Lindsey Williamson, her pastor, Lindsey Kays, NP  Summary of the conversation: We discussed details about her current clinical situation, acute illness, chronic illness including stage IV lung cancer noncurable at this point with recommended palliative chemotherapy/immunotherapy.  We discussed CODE STATUS, options moving forward, recommendations in the medical team.  Outcome of the  conversations and/or documents completed: Change CODE STATUS to DNR-limited (DNR/DNI), declines PleurX catheter and prefers regular thoracentesis moving forward, is open to continuing palliative chemotherapy, further goals of care conversations in the coming weeks as her clinical progress and treatments progress  I spent 30 minutes providing separately identifiable ACP services with the patient and/or surrogate decision maker in a voluntary, in-person conversation discussing the patient's wishes and goals as detailed in the above note.  Assessment & Plan:   HPI/Patient Profile:  73 y.o. female  with past medical history of RLL cancer, ILD, COPD, DM2, CAD, HTN, HLD, chronic respiratory failure who presented with Hemoptysis and progressive dyspnea since her prior hospitalization.  She was admitted on 08/16/2024 with right-sided pleural effusion, acute on chronic hypoxic respiratory failure, hemoptysis, COPD exacerbation, ILD, NSCLC stage IIIa, and others.    Palliative medicine was consulted for GOC conversations.  SUMMARY OF RECOMMENDATIONS   Changed to DNR-limited (DNR/DNI) Continue to treat treatable/current scope of care Ongoing emotional support of patient and family Referral to outpatient palliative care in the cancer center at discharge Palliative medicine will back off at this time as goals  are clear Please notify us  of any significant clinical change or new palliative needs  Symptom Management:  Per primary team Palliative medicine is available to assist as needed  Code Status: DNR - Limited (DNR/DNI)  Prognosis: < 6 months  Discharge Planning: To Be Determined  Discussed with: Patient, family, medical team, nursing team, patient's pastor  Thank you for allowing us  to participate in the care of WENDOLYN RASO PMT will continue to support holistically.  Billing based on MDM: High  Problems Addressed: One or more chronic illnesses with severe exacerbation, progression, or  side effects of treatment.  Risks: Decision not to resuscitate or to de-escalate care because of poor prognosis  Detailed review of medical records (labs, imaging, vital signs), medically appropriate exam, discussed with treatment team, counseling and education to patient, family, & staff, documenting clinical information, medication management, coordination of care  Lindsey Kays, NP Palliative Medicine Team  Team Phone # [phone] (Nights/Weekends)  08/25/2021, 8:17 AM

## 2024-08-23 NOTE — Inpatient Diabetes Management (Signed)
 Inpatient Diabetes Program Recommendations  AACE/ADA: New Consensus Statement on Inpatient Glycemic Control (2015)  Target Ranges:  Prepandial:   less than 140 mg/dL      Peak postprandial:   less than 180 mg/dL (1-2 hours)      Critically ill patients:  140 - 180 mg/dL   Lab Results  Component Value Date   GLUCAP 216 (H) 08/23/2024   HGBA1C 9.3 (H) 08/17/2024    Review of Glycemic Control  Latest Reference Range & Units 08/22/24 23:42 08/23/24 04:30 08/23/24 07:41  Glucose-Capillary 70 - 99 mg/dL 610 (H) 756 (H) 783 (H)  (H): Data is abnormally high Diabetes history: DM2 Outpatient Diabetes medications: Jardiance  25 mg every day, Glipizide  5 mg every day, Novolog  0-11 units TID, Metformin XR 1000 mg BID Current orders for Inpatient glycemic control: Novolog  0-9 units TID, Lantus  10 units QD Solumedrol 40 mg QD   Inpatient Diabetes Program Recommendations:     Consider adding Novolog  3 units TID with meals if he consumes at least 50%.    Thanks, Tinnie Minus, MSN, RNC-OB Diabetes Coordinator [phone] (8a-5p)

## 2024-08-23 NOTE — Progress Notes (Signed)
 NAME:  Lindsey Williamson, MRN:  [id number], DOB:  08-29-1951, LOS: 7 ADMISSION DATE:  08/16/2024, CONSULTATION DATE: 08/17/2024 REFERRING MD: Dr. 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  Followed in our office by Dr Neda, chronic hypoxemic respiratory failure on continuous O2.  Repeat bronchoscopy 05/19/2024 for hemoptysis showed an endobronchial lesion in the bronchus intermedius extending to the right lower lobe with associated bleeding.  Followed conservatively. She underwent chemoradiation with right upper lobe XRT, and then Imfinzi  complicated by associated pneumonitis June 2025 treated with steroids.  Most recent imaging included PET scan 07/16/2024 that showed progressive disease in the right lower lobe with associated complete atelectasis, contralateral left lower lobe metastasis (versus primary), progressive hilar and mediastinal adenopathy.  Current therapy is carboplatin /Paclitaxol + Keytruda .  Has continued to have cough, tried Hydromet with only partial success.  Her treatment was delayed and she was treated for possible bronchitis with levofloxacin  08/13/2024. Admitted now with progressive dyspnea, increased hemoptysis since the May hospitalization and bronchoscopy.  In the emergency department she received TXA nebs. She describes bloody mucous, at it's heaviest about 1/2 a cup on day prior to presentation. Usually bright red, sometimes dark spotty clots. Last time she saw any was this am - scant dark blood (after the TXA).    Pertinent  Medical History   Past Medical History:  Diagnosis Date   Angioedema 10/17/2023   Arthritis    Diabetes mellitus without complication (HCC)    Hypertension    Significant Hospital Events: Including  procedures, antibiotic start and stop dates in addition to other pertinent events   8/21 CT-PA >> no pulmonary embolism, extensive subcarinal and right hilar adenopathy that appears to extend and occlude the right lower lobe bronchus proximally.  3.7 cm left lower lobe mass.  Moderate right pleural effusion with associated right upper lobe atelectasis 8/24 thoracentesis for 500 amber-colored fluid, exudative 8/26 thoracentesis by IR, removed. Developed re-expansion pulmonary edema post-procedure, transferred back to step down unit  Interim History / Subjective:   No acute events overnight She is feeling a bit better today Transitioned to salter canula from HHFNC, weaned to 4L during our conversation  Objective    Blood pressure (!) 110/55, pulse 79, temperature 98.3 F (36.8 C), temperature source Oral, resp. rate 15, height 5' 4 (1.626 m), weight 73.5 kg, SpO2 98%.    FiO2 (%):  [40 %] 40 %   Intake/Output Summary (Last 24 hours) at 08/23/2024 0847 Last data filed at 08/23/2024 0400 Gross per 24 hour  Intake 120 ml  Output 800 ml  Net -680 ml   Filed Weights   08/21/24 1847 08/22/24 0500 08/23/24 0500  Weight: 70.8 kg 72.3 kg 73.5 kg    Examination: General: Chronically ill-appearing, comfortable, on HHFNC HENT: Moist oral mucosa Lungs: decreased breath sounds right base, scattered wheezes Cardiovascular: S1-S2 appreciated Abdomen: Soft, bowel sounds appreciated Extremities: No clubbing, no edema Neuro: Awake alert moving extremities appropriately GU:   Resolved problem list   Assessment and Plan   Acute Hypoxemic respiratory Failure - in setting of lung cancer, pleural effusion and re-expansion pulmonary edema - goal SpO2 92% or higher, wean O2 as able  Chronic obstructive pulmonary disease/emphysema Chronic respiratory failure - Continue oxygen supplementation, goal SpO2 92% or greater - Continue bronchodilator treatment -  continue steroids  Patient with  stage IV squamous cell lung cancer diagnosed October 2024 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 No changes in plan per Oncology team.  - Palliative care consulted  Malignant Large right pleural effusion Post-thoracentesis 8/23 with removed Exudative effusion - Cytology is suspicious for malignant cells - Discussed PleurX placement vs Thoracentesis. She is determining whether she wants pleurX. - Thoracentesis 8/26, removed  Hemoptysis Improved with tranexamic acid  -s/p tranexamic acid  every 8hr for 2 days, completed 8/24 - continue to monitor - Options for management of the hemoptysis may have to include bronchial artery embolization if she continues to bleed significantly despite ongoing treatment - Flexible bronchoscopy will likely not be of any benefit as we already know she does have endobronchial involvement   Best Practice (right click and Reselect all SmartList Selections daily)   Per Primary service  Labs   CBC: Recent Labs  Lab 08/16/24 1529 08/17/24 0349 08/18/24 0321 08/19/24 0301 08/20/24 0754 08/21/24 0503 08/22/24 0322  WBC 12.4*   < > 12.9* 11.3* 9.5 10.9* 10.2  NEUTROABS 10.0*  --   --  8.8* 7.0  --   --   HGB 9.7*   < > 8.9* 9.2* 8.3* 9.0* 9.3*  HCT 33.6*   < > 31.1* 32.2* 29.1* 30.8* 31.4*  MCV 87.0   < > 87.9 88.5 87.1 86.3 86.3  PLT 500*   < > 495* 560* 498* 562* 533*   < > = values in this interval not displayed.    Basic Metabolic Panel: Recent Labs  Lab 08/16/24 2002 08/17/24 0349 08/17/24 1402 08/18/24 0321 08/19/24 0301 08/20/24 0754 08/21/24 0503 08/22/24 0322  NA  --  133*   < > 140 140 138 138 137  K  --  4.1   < > 3.6 3.2* 4.0 3.7 3.7  CL  --  102   < > 105 105 105 104 100  CO2  --  12*   < > 23 22 24 25 24   GLUCOSE  --  231*   < > 245* 192* 147* 139* 157*  BUN  --  12   < > 16  11 16 15 15   CREATININE  --  0.67   < > 0.78 0.47 0.60 0.55 0.69  CALCIUM   --  8.8*   < > 8.6* 8.8* 8.7* 9.0 9.1  MG 1.5* 2.0  --  1.7 1.6* 1.9  --   --   PHOS 3.0 3.5  --  3.1 1.9* 3.2  --   --    < > = values in this interval not displayed.   GFR: Estimated Creatinine Clearance: 60.6 mL/min (by C-G formula based on SCr of 0.69 mg/dL). Recent Labs  Lab 08/16/24 2002 08/17/24 0349 08/19/24 0301 08/20/24 0754 08/21/24 0503 08/22/24 0322  WBC  --    < > 11.3* 9.5 10.9* 10.2  LATICACIDVEN 1.1  --   --   --   --   --    < > = values in this interval not displayed.    Liver Function Tests: Recent Labs  Lab 08/16/24 1529 08/17/24 0349 08/18/24 1142 08/19/24 0301 08/20/24 0754 08/21/24 0503  AST 15 11*  --  12* 27 11*  ALT <5 5  --  6 9 7   ALKPHOS 82 75  --  67 61 62  BILITOT 0.9  1.2  --  0.3 0.5 0.3  PROT 8.2* 7.5 6.8 6.6 6.4* 6.4*  ALBUMIN 2.8* 2.6*  --  2.6* 2.4* 2.4*   No results for input(s): LIPASE, AMYLASE in the last 168 hours. No results for input(s): AMMONIA in the last 168 hours.  ABG    Component Value Date/Time   HCO3 19.8 (L) 08/16/2024 1612   ACIDBASEDEF 4.0 (H) 08/16/2024 1612   O2SAT 36.5 08/16/2024 1612     Coagulation Profile: Recent Labs  Lab 08/16/24 1612  INR 1.0    Cardiac Enzymes: Recent Labs  Lab 08/16/24 2002  CKTOTAL 27*    HbA1C: Hgb A1c MFr Bld  Date/Time Value Ref Range Status  08/17/2024 04:21 AM 9.3 (H) 4.8 - 5.6 % Final    Comment:    (NOTE) Diagnosis of Diabetes The following HbA1c ranges recommended by the American Diabetes Association (ADA) may be used as an aid in the diagnosis of diabetes mellitus.  Hemoglobin             Suggested A1C NGSP%              Diagnosis  <5.7                   Non Diabetic  5.7-6.4                Pre-Diabetic  >6.4                   Diabetic  <7.0                   Glycemic control for                       adults with diabetes.    02/16/2024 05:59 AM 7.4 (H) 4.8 -  5.6 % Final    Comment:    (NOTE) Pre diabetes:          5.7%-6.4%  Diabetes:              >6.4%  Glycemic control for   <7.0% adults with diabetes     CBG: Recent Labs  Lab 08/22/24 1655 08/22/24 2000 08/22/24 2342 08/23/24 0430 08/23/24 0741  GLUCAP 188* 368* 389* 243* 216*    Dorn Chill, MD [facility] Office: [phone]   See Amion for personal pager PCCM on call pager [phone] until 7pm. Please call Elink 7p-7a. [phone]

## 2024-08-23 NOTE — Plan of Care (Signed)
  Problem: Tissue Perfusion: Goal: Adequacy of tissue perfusion will improve Outcome: Progressing   Problem: Respiratory: Goal: Ability to maintain a clear airway will improve Outcome: Progressing   Problem: Coping: Goal: Level of anxiety will decrease Outcome: Progressing   Problem: Elimination: Goal: Will not experience complications related to urinary retention Outcome: Progressing

## 2024-08-23 NOTE — Progress Notes (Signed)
 Progress Note    Lindsey Williamson   FMW:[id number]  DOB: 1951/01/14  DOA: 08/16/2024     7 PCP: [name], MD  Initial CC: SOB, hemoptysis  Hospital Course:  73 y.o. female with a hx of RLL cancer, ILD, COPD, DM2, CAD, HTN, HLD, chronic respiratory failure who presented with Hemoptysis and progressive dyspnea since her prior hospitalization.  She was treated with TXA nebs with improvement in hemoptysis.  She was also found to have worsening pleural effusion status post thoracentesis awaiting studies.  Consulted pulmonary and cardiology.    Assessment and Plan:  Right Sided Pleural Effusion Acute on chronic hypoxic respiratory failure -  Pulmonary pursing Thoracentesis for further evaluation and she had 500 mL off. - Cytology from thoracentesis on 08/18/2024 noted to have malignant cells -Oncology aware with no change recommended and plans for now - Pulmonology discussing potential PleurX catheter - Had further decompensation with respiratory status after repeat thoracentesis on 08/21/2024 - Continue with Lasix  intermittently - Weaning oxygen as able; back to SDU on 8/26 and placed on HHFNC - Oxygen levels able to be weaned down on 08/23/2024; continue weaning as able  NSCLC stage 3a GOC discussions - Diagnosed in October 2020 for 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 Follows with Dr. Sherrod and currently on palliative systemic chemotherapy with carboplatin  and paclitaxel  as well as Keytruda  - Palliative care meeting with patient to discuss options at discharge  Hemoptysis - resolved  -  Improved with TXA nebs and Most likely secondary to underlying malignancy - Seen by PCCM/pulmonary - If recurs, recommendation would be for consideration of IR evaluation for possible embolization - s/p TXA  COPD exacerbation ILD - Continue supplemental O2; on 2L at home - Completed Rocephin  course -Completed steroid  course -Continue breathing treatments   Sinus tachycardia versus atrial tachycardia: Continue to monitor on telemetry Seen by cardiology they feel that her heart rate is improved with improvement in her pulm status.  They believe this is most likely sinus tachycardia but she may have had a brief atrial tachycardia episode in the ED. - Tolerating bisoprolol  and Cardizem .  Amlodipine  has been discontinued -Zio patch being planned for at discharge   Metabolic acidosis - s/p treatment   Essential Hypertension - BP regimen modified after admission - Continue bisoprolol  and Cardizem    Hypokalemia Hypophosphatemia Hypomagnesemia -Repleting as necessary  Normocytic Anemia - Hgb/Hct Trend relatively stable and last check was 9.2/32.2 w/ MCV of 88.5. Checked Anemia Panel and showed iron level of 27, TIBC 194, TIBC 221, saturation of 12%, ferritin 1169, vitamin B12 1832.  CTM for S/Sx of Bleeding; No overt bleeding noted   Diabetes Mellitus Type 2 - Hemoglobin A1c 9.3, elevated; At risk for hyperglycemia due to steroid - NovoLog  sliding scale and Monitor CBG, diabetic diet   CAD HLD  Continue statin; Held aspirin  for now due to hemoptysis   Anxiety -Atarax  25 mg p.o. 3 times daily  Overweight: Complicates overall prognosis and care. Estimated body mass index is 27.55 kg/m as calculated from the following:   Height as of this encounter: 5' 4 (1.626 m).   Weight as of this encounter: 72.8 kg. Weight Loss and Dietary Counseling given  Interval History:  No events overnight.  Starting to breathe a little bit easier over the past couple days.  Oxygen level able to be weaned further this morning.  Diuresing well with Lasix .   Old  records reviewed in assessment of this patient  Antimicrobials: Rocephin  08/16/2024 >> 08/22/2024  DVT prophylaxis:  SCDs Start: 08/16/24 2332   Code Status:   Code Status: Full Code  Mobility Assessment (Last 72 Hours)     Mobility Assessment     Row  Name 08/23/24 0800 08/22/24 2000 08/22/24 0800 08/21/24 1945 08/21/24 0923   Does the patient have exclusion criteria? No - Perform mobility assessment No - Perform mobility assessment No - Perform mobility assessment No - Perform mobility assessment No - Perform mobility assessment   What is the highest level of mobility based on the mobility assessment? Level 4 (Ambulates with assistance) - Balance while stepping forward/back - Complete Level 4 (Ambulates with assistance) - Balance while stepping forward/back - Complete Level 5 (Ambulates independently) - Balance while walking independently - Complete Level 4 (Ambulates with assistance) - Balance while stepping forward/back - Complete Level 4 (Ambulates with assistance) - Balance while stepping forward/back - Complete   Is the above level different from baseline mobility prior to current illness? No - Consider discontinuing PT/OT -- No - Consider discontinuing PT/OT -- Yes - Recommend PT order    Row Name 08/20/24 2113 08/20/24 1445         Does the patient have exclusion criteria? No - Perform mobility assessment No - Perform mobility assessment      What is the highest level of mobility based on the mobility assessment? Level 2 (Chairfast) - Balance while sitting on edge of bed and cannot stand Level 4 (Ambulates with assistance) - Balance while stepping forward/back - Complete      Is the above level different from baseline mobility prior to current illness? -- Yes - Recommend PT order         Barriers to discharge: None Disposition Plan: Home HH orders placed: TBD Status is: Inpatient  Objective: Blood pressure (!) 117/50, pulse 86, temperature 97.9 F (36.6 C), temperature source Oral, resp. rate 20, height 5' 4 (1.626 m), weight 73.5 kg, SpO2 97%.  Examination:  Physical Exam Constitutional:      Appearance: Normal appearance.  HENT:     Head: Normocephalic and atraumatic.     Mouth/Throat:     Mouth: Mucous membranes are  moist.  Eyes:     Extraocular Movements: Extraocular movements intact.  Cardiovascular:     Rate and Rhythm: Normal rate and regular rhythm.  Pulmonary:     Effort: Pulmonary effort is normal. No respiratory distress.     Breath sounds: Examination of the right-middle field reveals decreased breath sounds. Examination of the right-lower field reveals decreased breath sounds. Decreased breath sounds (Slowly improving) present. No wheezing.  Abdominal:     General: Bowel sounds are normal. There is no distension.     Palpations: Abdomen is soft.     Tenderness: There is no abdominal tenderness.  Musculoskeletal:        General: Normal range of motion.     Cervical back: Normal range of motion and neck supple.  Skin:    General: Skin is warm and dry.  Neurological:     General: No focal deficit present.     Mental Status: She is alert.  Psychiatric:        Mood and Affect: Mood normal.      Consultants:  Pulmonology   Procedures:    Data Reviewed: Results for orders placed or performed during the hospital encounter of 08/16/24 (from the past 24 hours)  Glucose, capillary  Status: Abnormal   Collection Time: 08/22/24  4:55 PM  Result Value Ref Range   Glucose-Capillary 188 (H) 70 - 99 mg/dL   Comment 1 Notify RN    Comment 2 Document in Chart   Glucose, capillary     Status: Abnormal   Collection Time: 08/22/24  8:00 PM  Result Value Ref Range   Glucose-Capillary 368 (H) 70 - 99 mg/dL  Glucose, capillary     Status: Abnormal   Collection Time: 08/22/24 11:42 PM  Result Value Ref Range   Glucose-Capillary 389 (H) 70 - 99 mg/dL  Glucose, capillary     Status: Abnormal   Collection Time: 08/23/24  4:30 AM  Result Value Ref Range   Glucose-Capillary 243 (H) 70 - 99 mg/dL  Glucose, capillary     Status: Abnormal   Collection Time: 08/23/24  7:41 AM  Result Value Ref Range   Glucose-Capillary 216 (H) 70 - 99 mg/dL   Comment 1 Notify RN    Comment 2 Document in Chart    Glucose, capillary     Status: Abnormal   Collection Time: 08/23/24 11:40 AM  Result Value Ref Range   Glucose-Capillary 375 (H) 70 - 99 mg/dL   Comment 1 Notify RN    Comment 2 Document in Chart     I have reviewed pertinent nursing notes, vitals, labs, and images as necessary. I have ordered labwork to follow up on as indicated.  I have reviewed the last notes from staff over past 24 hours. I have discussed patient's care plan and test results with nursing staff, CM/SW, and other staff as appropriate.  Time spent: Greater than 50% of the 55 minute visit was spent in counseling/coordination of care for the patient as laid out in the A&P.   LOS: 7 days   Alm Apo, MD [facility] 08/23/2024, 2:02 PM

## 2024-08-24 ENCOUNTER — Other Ambulatory Visit (HOSPITAL_COMMUNITY): Payer: Self-pay

## 2024-08-24 DIAGNOSIS — J90 Pleural effusion, not elsewhere classified: Secondary | ICD-10-CM | POA: Diagnosis not present

## 2024-08-24 DIAGNOSIS — J441 Chronic obstructive pulmonary disease with (acute) exacerbation: Secondary | ICD-10-CM | POA: Diagnosis not present

## 2024-08-24 DIAGNOSIS — J449 Chronic obstructive pulmonary disease, unspecified: Secondary | ICD-10-CM | POA: Diagnosis not present

## 2024-08-24 DIAGNOSIS — J9621 Acute and chronic respiratory failure with hypoxia: Secondary | ICD-10-CM | POA: Diagnosis not present

## 2024-08-24 DIAGNOSIS — J910 Malignant pleural effusion: Secondary | ICD-10-CM | POA: Diagnosis not present

## 2024-08-24 DIAGNOSIS — J9601 Acute respiratory failure with hypoxia: Secondary | ICD-10-CM | POA: Diagnosis not present

## 2024-08-24 LAB — GLUCOSE, CAPILLARY
Glucose-Capillary: 365 mg/dL — ABNORMAL HIGH (ref 70–99)
Glucose-Capillary: 262 mg/dL — ABNORMAL HIGH (ref 70–99)
Glucose-Capillary: 50 mg/dL — ABNORMAL LOW (ref 70–99)
Glucose-Capillary: 57 mg/dL — ABNORMAL LOW (ref 70–99)
Glucose-Capillary: 78 mg/dL (ref 70–99)
Glucose-Capillary: 176 mg/dL — ABNORMAL HIGH (ref 70–99)
Glucose-Capillary: 416 mg/dL — ABNORMAL HIGH (ref 70–99)

## 2024-08-24 LAB — GLUCOSE, RANDOM: Glucose, Bld: 480 mg/dL — ABNORMAL HIGH (ref 70–99)

## 2024-08-24 LAB — CYTOLOGY - NON PAP

## 2024-08-24 MED ORDER — INSULIN GLARGINE 100 UNIT/ML ~~LOC~~ SOLN
10.0000 [IU] | Freq: Every day | SUBCUTANEOUS | Status: DC
  Administered 2024-08-24 – 2024-08-25 (×2): 10 [IU] via SUBCUTANEOUS
  Filled 2024-08-24 (×2): qty 0.1

## 2024-08-24 MED ORDER — BISOPROLOL FUMARATE 10 MG PO TABS
10.0000 mg | ORAL_TABLET | Freq: Every day | ORAL | 3 refills | Status: DC
  Filled 2024-08-24: qty 90, 90d supply, fill #0

## 2024-08-24 MED ORDER — INSULIN ASPART 100 UNIT/ML IJ SOLN
10.0000 [IU] | Freq: Once | INTRAMUSCULAR | Status: AC
  Administered 2024-08-24: 10 [IU] via SUBCUTANEOUS

## 2024-08-24 MED ORDER — GUAIFENESIN-CODEINE 100-10 MG/5ML PO SOLN
5.0000 mL | Freq: Three times a day (TID) | ORAL | 0 refills | Status: DC | PRN
  Filled 2024-08-24: qty 120, 8d supply, fill #0

## 2024-08-24 MED ORDER — DEXTROSE 50 % IV SOLN: 12.5000 g | Freq: Once | INTRAVENOUS | Status: DC

## 2024-08-24 MED ORDER — DILTIAZEM HCL ER COATED BEADS 360 MG PO CP24
360.0000 mg | ORAL_CAPSULE | Freq: Every day | ORAL | 3 refills | Status: DC
  Filled 2024-08-24: qty 90, 90d supply, fill #0

## 2024-08-24 NOTE — Progress Notes (Signed)
 Physical Therapy Treatment Patient Details Name: Lindsey Williamson MRN: [id number] DOB: 01-24-1951 Today's Date: 08/24/2024   History of Present Illness Pt admitted from home 2* hemoptysis, COPD exacerbation, metabolic acidosis.  Pt s/p thoracentesis 08/18/24.  Pt with hx of DM, htn, angioedema, COPD on 2L home O2, CAD s/p CABG, and stg 3 Lung CA    PT Comments   Pt admitted with above diagnosis.  Pt currently with functional limitations due to the deficits listed below (see PT Problem List). Pt seated EOB when PT arrived. OT providing intervention and pt expressed concern per readiness to d/c home. Pt states she has not been up and ambulating in about one week. Pt reports that she has 15 steps to navigate to get into apartment, spouse present and states he just is tired of pt going in/out of the hospital and wants to make sure she is ready. Pt reported dizziness sitting EOB, dizziness increased with standing, brief gait tasks and with SPT to recliner, pt required CGA for sit to stand  and SPT as well as gait tasks with RW forward/backward. PT recommends RW for safety, stability and energy conservation at this time. Pt limited with mobility due to orthostatic hypotension, please see below.  Pt will benefit from acute skilled PT to increase their independence and safety with mobility to allow discharge.   blood pressure seated EOB 134/53 Bp standing 107/48 Bp immediate return to bed 129/53 Bp s/p SPT bed to recliner 121/67 with reports of dizziness. Nursing aware of Bp findings and pt report.     If plan is discharge home, recommend the following: A little help with walking and/or transfers;A little help with bathing/dressing/bathroom;Assistance with cooking/housework;Assist for transportation;Help with stairs or ramp for entrance   Can travel by private vehicle        Equipment Recommendations  None recommended by PT    Recommendations for Other Services       Precautions / Restrictions  Precautions Precautions: Fall (othostatic hypotension) Restrictions Weight Bearing Restrictions Per Provider Order: No Other Position/Activity Restrictions: O2 use at baseline     Mobility  Bed Mobility               General bed mobility comments: pt seated EOB when PT arrived    Transfers Overall transfer level: Needs assistance Equipment used: Rolling walker (2 wheels) Transfers: Sit to/from Stand, Bed to chair/wheelchair/BSC Sit to Stand: Contact guard assist Stand pivot transfers: Contact guard assist         General transfer comment: min cues and pt able to push to stand from EOB    Ambulation/Gait Ambulation/Gait assistance: Contact guard assist Gait Distance (Feet): 3 Feet Assistive device: Rolling walker (2 wheels) Gait Pattern/deviations: Step-through pattern, Trunk flexed Gait velocity: decr     General Gait Details: slight trunk flexion with reports of dizziness, pt able to amb forward and backward 3 feet x 2   Stairs             Wheelchair Mobility     Tilt Bed    Modified Rankin (Stroke Patients Only)       Balance Overall balance assessment: Needs assistance Sitting-balance support: No upper extremity supported, Feet supported Sitting balance-Leahy Scale: Good     Standing balance support: Bilateral upper extremity supported, During functional activity, Reliant on assistive device for balance Standing balance-Leahy Scale: Fair Standing balance comment: static standing without UE support  Communication Communication Communication: No apparent difficulties  Cognition Arousal: Alert Behavior During Therapy: WFL for tasks assessed/performed   PT - Cognitive impairments: No apparent impairments                         Following commands: Intact      Cueing Cueing Techniques: Verbal cues  Exercises General Exercises - Lower Extremity Ankle Circles/Pumps: AROM, Both, 10 reps,  Seated Long Arc Quad: AROM, Both, 10 reps Hip Flexion/Marching: AROM, Both, 10 reps    General Comments General comments (skin integrity, edema, etc.): pt able to maintain O2 saturation >/=91% on 4 L/min no reports of SOB, pt productive cough with bloody discharge and reports of dizziness seated EOB and with standing tasks      Pertinent Vitals/Pain Pain Assessment Pain Assessment: No/denies pain    Home Living                          Prior Function            PT Goals (current goals can now be found in the care plan section) Acute Rehab PT Goals Patient Stated Goal: Regain IND PT Goal Formulation: With patient Time For Goal Achievement: 09/02/24 Potential to Achieve Goals: Good Progress towards PT goals: Progressing toward goals    Frequency    Min 2X/week      PT Plan      Co-evaluation PT/OT/SLP Co-Evaluation/Treatment: Yes Reason for Co-Treatment: Complexity of the patient's impairments (multi-system involvement);Necessary to address cognition/behavior during functional activity;For patient/therapist safety PT goals addressed during session: Mobility/safety with mobility;Balance;Proper use of DME        AM-PAC PT 6 Clicks Mobility   Outcome Measure  Help needed turning from your back to your side while in a flat bed without using bedrails?: A Little Help needed moving from lying on your back to sitting on the side of a flat bed without using bedrails?: A Little Help needed moving to and from a bed to a chair (including a wheelchair)?: A Little Help needed standing up from a chair using your arms (e.g., wheelchair or bedside chair)?: A Little Help needed to walk in hospital room?: A Little Help needed climbing 3-5 steps with a railing? : A Lot 6 Click Score: 17    End of Session Equipment Utilized During Treatment: Gait belt Activity Tolerance: Treatment limited secondary to medical complications (Comment) Patient left: in chair;with call  bell/phone within reach;with chair alarm set Nurse Communication: Mobility status PT Visit Diagnosis: Difficulty in walking, not elsewhere classified (R26.2)     Time: 1442-1510 PT Time Calculation (min) (ACUTE ONLY): 28 min  Charges:    $Therapeutic Activity: 8-22 mins PT General Charges $$ ACUTE PT VISIT: 1 Visit                     [place], PT Acute Rehab    [place] VEAR Drone 08/24/2024, 4:45 PM

## 2024-08-24 NOTE — TOC Transition Note (Signed)
 Transition of Care [facility]) - Discharge Note   Patient Details  Name: Lindsey Williamson MRN: [id number] Date of Birth: 1951/12/16  [facility] CM/SW Contact:  Jon ONEIDA Anon, RN Phone Number: 08/24/2024, 10:31 AM   Clinical Narrative:    Pt will discharge home with [facility] PT/OT/RN services with [facility]. Pt is aware and agreeable to DC plan. Pt already receiving O2 through [employer]. New Home O2 orders sent with new Liter flow. [employer] will deliver travel tank to hospital room. Pt spouse will provide transportation at DC. There are no other IP Care Management needs at this time.     Final next level of care: Home w Home Health Services Barriers to Discharge: No Barriers Identified   Patient Goals and CMS Choice Patient states their goals for this hospitalization and ongoing recovery are:: To return home with Home health services CMS Medicare.gov Compare Post Acute Care list provided to:: Patient Choice offered to / list presented to : Patient [place] ownership interest in [facility].provided to:: Patient    Discharge Placement                       Discharge Plan and Services Additional resources added to the After Visit Summary for   In-house Referral: NA Discharge Planning Services: CM Consult Post Acute Care Choice: Home Health, Durable Medical Equipment          DME Arranged: Oxygen DME Agency: [employer] Date DME Agency Contacted: 08/24/24 Time DME Agency Contacted: [phone] Representative spoke with at DME Agency: [place] with [employer] HH Arranged: RN, PT, OT [facility] Agency: [facility] Date [facility] Agency Contacted: 08/24/24 Time HH Agency Contacted: [phone] Representative spoke with at [facility] Agency: [place] with [place]  Social Drivers of Health (SDOH) Interventions SDOH Screenings   Food Insecurity: No Food Insecurity (08/17/2024)  Housing: Low Risk  (08/17/2024)  Transportation Needs: No Transportation Needs (08/17/2024)  Utilities:  Not At Risk (08/17/2024)  Depression (PHQ2-9): Low Risk  (07/23/2024)  Financial Resource Strain: Low Risk  (04/24/2024)   Received from [facility]  Physical Activity: Inactive (04/24/2024)   Received from [facility]  Social Connections: Moderately Integrated (08/17/2024)  Stress: Stress Concern Present (04/24/2024)   Received from [facility]  Tobacco Use: Medium Risk (08/16/2024)     Readmission Risk Interventions    08/20/2024   10:10 AM 02/18/2024    9:51 AM  Readmission Risk Prevention Plan  Transportation Screening Complete Complete  PCP or Specialist Appt within 3-5 Days  Complete  HRI or Home Care Consult  Complete  Social Work Consult for Recovery Care Planning/Counseling  Complete  Palliative Care Screening  Not Applicable  Medication Review [employer]) Complete Complete  PCP or Specialist appointment within 3-5 days of discharge Complete   HRI or Home Care Consult Complete   SW Recovery Care/Counseling Consult Complete   Palliative Care Screening Not Applicable   Skilled Nursing Facility Not Applicable

## 2024-08-24 NOTE — Progress Notes (Signed)
 Patient was set to discharge today, but after working with PT, the MD made the decision to cancel the d/c order. Medications were delivered to the room for discharge prior to the d/c cancellation. I sent the medications home with the husband so the patient would have them when she was discharged.

## 2024-08-24 NOTE — Progress Notes (Signed)
Discharge medications delivered to patient at bedside.

## 2024-08-24 NOTE — Discharge Instructions (Addendum)
 Cardiology will arrange for a heart monitor at discharge and arrange follow up appointment as well.   Discuss routine/repeat thoracentesis with oncology and palliative care  Pravastatin  discontinued due to no further benefit from continuing.

## 2024-08-24 NOTE — Plan of Care (Signed)
  Problem: Coping: Goal: Ability to adjust to condition or change in health will improve Outcome: Progressing   Problem: Fluid Volume: Goal: Ability to maintain a balanced intake and output will improve Outcome: Progressing   Problem: Health Behavior/Discharge Planning: Goal: Ability to manage health-related needs will improve Outcome: Progressing   Problem: Metabolic: Goal: Ability to maintain appropriate glucose levels will improve Outcome: Not Progressing   Problem: Activity: Goal: Ability to tolerate increased activity will improve Outcome: Progressing   Problem: Coping: Goal: Level of anxiety will decrease Outcome: Progressing   Problem: Pain Managment: Goal: General experience of comfort will improve and/or be controlled Outcome: Progressing   Problem: Safety: Goal: Ability to remain free from injury will improve Outcome: Progressing

## 2024-08-24 NOTE — Discharge Summary (Signed)
 Physician Discharge Summary   Lindsey Williamson FMW:[id number] DOB: [birth year] DOA: 08/16/2024  PCP: [name], MD  Admit date: 08/16/2024 Discharge date: 08/24/2024  Admitted From: Home Disposition:  Home Discharging physician: Alm Apo, MD Barriers to discharge: none  Recommendations at discharge: Follow up with cardiology and zio monitoring Follow up with oncology. Consider routine thoracentesis vs pleurX discussions  Continue ongoing goals of care talks with palliative care   Discharge Condition: stable CODE STATUS: DNR/DNI Diet recommendation:  Diet Orders (From admission, onward)     Start     Ordered   08/24/24 0000  Diet Carb Modified        08/24/24 0921   08/16/24 2332  Diet Carb Modified Fluid consistency: Thin; Room service appropriate? Yes  Diet effective now       Question Answer Comment  Diet-HS Snack? Nothing   Calorie Level Medium 1600-2000   Fluid consistency: Thin   Room service appropriate? Yes      08/16/24 2331            Hospital Course:  73 y.o. female with a hx of RLL cancer, ILD, COPD, DM2, CAD, HTN, HLD, chronic respiratory failure who presented with Hemoptysis and progressive dyspnea since her prior hospitalization.  She was treated with TXA nebs with improvement in hemoptysis.  She was also found to have worsening pleural effusion status post thoracentesis awaiting studies.  Consulted pulmonary and cardiology.    Assessment and Plan:  Right Sided Pleural Effusion Acute on chronic hypoxic respiratory failure -  Pulmonary pursing Thoracentesis for further evaluation and she had 500 mL off. - Cytology from thoracentesis on 08/18/2024 noted to have malignant cells -Oncology aware with no change recommended and plans for now - Pulmonology discussing potential PleurX catheter; patient wishes to think about it and discuss further outpatient - Had further decompensation with respiratory status after repeat thoracentesis on 08/21/2024 -  Treated with Lasix  intermittently; diuresed well - Weaning oxygen as able; back to SDU on 8/26 and placed on HHFNC; oxygen able to be weaned down to 3 to 4 L prior to discharge - Would benefit from serial thoracentesis at discharge versus consideration of PleurX catheter; further discussions to continue with palliative care and oncology at discharge  NSCLC stage 3a GOC discussions - Diagnosed in October 2020 for 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 Follows with Dr. Sherrod and currently on palliative systemic chemotherapy with carboplatin  and paclitaxel  as well as Keytruda  - Palliative care met with patient during hospitalization; ongoing discussions to continue at discharge  Hemoptysis - resolved  -  Improved with TXA nebs and Most likely secondary to underlying malignancy - Seen by PCCM/pulmonary - If recurs, recommendation would be for consideration of IR evaluation for possible embolization - s/p TXA  COPD exacerbation ILD - Continue supplemental O2; on 2L at home - Completed Rocephin  course -Completed steroid course; caused significant hyperglycemia.  Not on steroids on admission and therefore will not continue further at discharge -Continue breathing treatments   Sinus tachycardia versus atrial tachycardia: Continue to monitor on telemetry Seen by cardiology they feel that her heart rate is improved with improvement in her pulm status.  They believe this is most likely sinus tachycardia but she may have had a brief atrial tachycardia episode in the ED. - Tolerating bisoprolol  and Cardizem .  Amlodipine  has been discontinued -Zio patch being planned for at discharge   Metabolic acidosis - s/p  treatment   Essential Hypertension - BP regimen modified after admission - Continue bisoprolol  and Cardizem    Hypokalemia Hypophosphatemia Hypomagnesemia -Repleted  Normocytic Anemia - Hgb/Hct Trend relatively  stable and last check was 9.2/32.2 w/ MCV of 88.5. Checked Anemia Panel and showed iron level of 27, TIBC 194, TIBC 221, saturation of 12%, ferritin 1169, vitamin B12 1832.  CTM for S/Sx of Bleeding; No overt bleeding noted   Diabetes Mellitus Type 2 - Hemoglobin A1c 9.3, elevated; At risk for hyperglycemia due to steroid - NovoLog  sliding scale and Monitor CBG, diabetic diet   CAD HLD -No further mortality benefit with statin, discontinued   Anxiety -Atarax  25 mg p.o. 3 times daily  Overweight: Complicates overall prognosis and care. Estimated body mass index is 27.55 kg/m as calculated from the following:   Height as of this encounter: 5' 4 (1.626 m).   Weight as of this encounter: 72.8 kg. Weight Loss and Dietary Counseling given    Principal Diagnosis: COPD exacerbation (HCC)  Discharge Diagnoses: Active Hospital Problems   Diagnosis Date Noted   COPD exacerbation (HCC) 08/16/2024   Pleural effusion on right 08/22/2024    Priority: 1.   Acute on chronic respiratory failure with hypoxia (HCC) 04/06/2024    Priority: 1.   Primary cancer of right lower lobe of lung (HCC) 04/08/2022    Priority: 1.   ILD (interstitial lung disease) (HCC) 04/06/2024    Priority: 4.   Hypomagnesemia 08/16/2024   Prolonged QT interval 08/16/2024   Ectopic atrial tachycardia (HCC) 08/16/2024   Type 2 diabetes mellitus with microalbuminuria (HCC) 04/23/2015   Hyperlipidemia 12/12/2013   Hypertension 12/12/2013    Resolved Hospital Problems   Diagnosis Date Noted Date Resolved   Hemoptysis 05/18/2024 08/22/2024    Priority: 1.   Dyspnea 10/18/2023 08/22/2024    Priority: 5.     Discharge Instructions     Amb Referral to Palliative Care   Complete by: As directed    Ongoing GOC pending tolerance/QOL with palliative treatment; may need assist with symptom management too   Diet Carb Modified   Complete by: As directed    Increase activity slowly   Complete by: As directed        Allergies as of 08/24/2024       Reactions   Benzonatate Palpitations   Lisinopril Swelling, Other (See Comments)   Patient ended up in the ED with a swollen mouth and tongue   Aspirin  Nausea And Vomiting, Palpitations   Can tolerate baby aspirin  81 mg without difficulty but can't tolerate higher dosages   Azithromycin  Palpitations   Codeine  Palpitations   Patient states that this is not an allergy         Medication List     STOP taking these medications    amLODipine  5 MG tablet Commonly known as: NORVASC    dexamethasone  4 MG tablet Commonly known as: DECADRON    doxycycline  100 MG tablet Commonly known as: VIBRA -TABS   levofloxacin  500 MG tablet Commonly known as: LEVAQUIN    omeprazole  20 MG capsule Commonly known as: PRILOSEC   potassium chloride  SA 20 MEQ tablet Commonly known as: KLOR-CON  M   pravastatin  80 MG tablet Commonly known as: PRAVACHOL    predniSONE  20 MG tablet Commonly known as: DELTASONE        TAKE these medications    albuterol  108 (90 Base) MCG/ACT inhaler Commonly known as: VENTOLIN  HFA Inhale 2 puffs into the lungs every 6 (six) hours as needed for wheezing or shortness of  breath.   aspirin  EC 81 MG tablet Take 81 mg by mouth daily. Swallow whole.   bisoprolol  10 MG tablet Commonly known as: ZEBETA  Take 1 tablet (10 mg total) by mouth daily. What changed:  medication strength how much to take when to take this   cetirizine  10 MG tablet Commonly known as: ZyrTEC  Allergy  Take 1 tablet (10 mg total) by mouth daily.   diclofenac Sodium 1 % Gel Commonly known as: VOLTAREN Apply 1 Application topically daily as needed (pain).   diltiazem  360 MG 24 hr capsule Commonly known as: Cardizem  CD Take 1 capsule (360 mg total) by mouth daily.   empagliflozin  25 MG Tabs tablet Commonly known as: JARDIANCE  Take 25 mg by mouth daily at 6 PM.   ferrous sulfate  325 (65 FE) MG tablet Take 325 mg by mouth daily with breakfast.    glipiZIDE  5 MG 24 hr tablet Commonly known as: GLUCOTROL  XL Take 1 tablet (5 mg total) by mouth daily with breakfast.   guaiFENesin -codeine  100-10 MG/5ML syrup Take 5 mLs by mouth 3 (three) times daily as needed for cough.   ipratropium-albuterol  0.5-2.5 (3) MG/3ML Soln Commonly known as: DUONEB Take 3 mLs by nebulization in the morning and at bedtime.   lidocaine -prilocaine  cream Commonly known as: EMLA  Apply to affected area once   metFORMIN 500 MG 24 hr tablet Commonly known as: GLUCOPHAGE-XR Take 1,000 mg by mouth in the morning and at bedtime.   NovoLOG  FlexPen 100 UNIT/ML FlexPen Generic drug: insulin  aspart Inject 0-11 Units into the skin 3 (three) times daily with meals. Check Blood Glucose (BG) and inject per scale: BG <150= 0 unit; BG 150-200= 1 unit; BG 201-250= 3 unit; BG 251-300= 5 unit; BG 301-350= 7 unit; BG 351-400= 9 unit; BG >400= 11 unit and Call Primary Care. What changed:  when to take this reasons to take this   ondansetron  8 MG tablet Commonly known as: Zofran  Take 1 tablet (8 mg total) by mouth every 8 (eight) hours as needed for nausea or vomiting. Start on the third day after carboplatin .   PEG 3350  17 g Pack Take 17 g packet by mouth daily.   prochlorperazine  10 MG tablet Commonly known as: COMPAZINE  Take 1 tablet (10 mg total) by mouth every 6 (six) hours as needed.   umeclidinium-vilanterol 62.5-25 MCG/ACT Aepb Commonly known as: Anoro Ellipta  Inhale 1 puff into the lungs daily.               Durable Medical Equipment  (From admission, onward)           Start     Ordered   08/24/24 0858  For home use only DME oxygen  Once       Question Answer Comment  Length of Need Lifetime   Mode or (Route) Nasal cannula   Liters per Minute 4   Frequency Continuous (stationary and portable oxygen unit needed)   Oxygen conserving device Yes   Oxygen delivery system Gas      08/24/24 0857            Follow-up Information      Croitoru, Mihai, MD. Schedule an appointment as soon as possible for a visit in 1 month(s).   Specialty: Cardiology Contact information: [address] [place] [address] [phone]                Allergies  Allergen Reactions   Benzonatate Palpitations   Lisinopril Swelling and Other (See Comments)  Patient ended up in the ED with a swollen mouth and tongue   Aspirin  Nausea And Vomiting and Palpitations    Can tolerate baby aspirin  81 mg without difficulty but can't tolerate higher dosages   Azithromycin  Palpitations   Codeine  Palpitations    Patient states that this is not an allergy     Consultations: Pulmonology Palliative care  Procedures:   Discharge Exam: BP (!) 126/49   Pulse 70   Temp 98.6 F (37 C) (Oral)   Resp 16   Ht 5' 4 (1.626 m)   Wt 76.1 kg   SpO2 94%   BMI 28.80 kg/m  Physical Exam Constitutional:      Appearance: Normal appearance.  HENT:     Head: Normocephalic and atraumatic.     Mouth/Throat:     Mouth: Mucous membranes are moist.  Eyes:     Extraocular Movements: Extraocular movements intact.  Cardiovascular:     Rate and Rhythm: Normal rate and regular rhythm.  Pulmonary:     Effort: Pulmonary effort is normal. No respiratory distress.     Breath sounds: Examination of the right-middle field reveals decreased breath sounds. Examination of the right-lower field reveals decreased breath sounds. Decreased breath sounds (Slowly improving) present. No wheezing.  Abdominal:     General: Bowel sounds are normal. There is no distension.     Palpations: Abdomen is soft.     Tenderness: There is no abdominal tenderness.  Musculoskeletal:        General: Normal range of motion.     Cervical back: Normal range of motion and neck supple.  Skin:    General: Skin is warm and dry.  Neurological:     General: No focal deficit present.     Mental Status: She is alert.  Psychiatric:        Mood and Affect: Mood normal.       The results of significant diagnostics from this hospitalization (including imaging, microbiology, ancillary and laboratory) are listed below for reference.   Microbiology: Recent Results (from the past 240 hours)  Resp panel by RT-PCR (RSV, Flu A&B, Covid) Anterior Nasal Swab     Status: None   Collection Time: 08/16/24  9:24 PM   Specimen: Anterior Nasal Swab  Result Value Ref Range Status   SARS Coronavirus 2 by RT PCR NEGATIVE NEGATIVE Final    Comment: (NOTE) SARS-CoV-2 target nucleic acids are NOT DETECTED.  The SARS-CoV-2 RNA is generally detectable in upper respiratory specimens during the acute phase of infection. The lowest concentration of SARS-CoV-2 viral copies this assay can detect is 138 copies/mL. A negative result does not preclude SARS-Cov-2 infection and should not be used as the sole basis for treatment or other patient management decisions. A negative result may occur with  improper specimen collection/handling, submission of specimen other than nasopharyngeal swab, presence of viral mutation(s) within the areas targeted by this assay, and inadequate number of viral copies(<138 copies/mL). A negative result must be combined with clinical observations, patient history, and epidemiological information. The expected result is Negative.  Fact Sheet for Patients:  BloggerCourse.com  Fact Sheet for Healthcare Providers:  SeriousBroker.it  This test is no t yet approved or cleared by the [place]  FDA and  has been authorized for detection and/or diagnosis of SARS-CoV-2 by FDA under an Emergency Use Authorization (EUA). This EUA will remain  in effect (meaning this test can be used) for the duration of the COVID-19 declaration under Section 564(b)(1) of the Act,  21 U.S.C.section 360bbb-3(b)(1), unless the authorization is terminated  or revoked sooner.       Influenza A by PCR NEGATIVE NEGATIVE Final    Influenza B by PCR NEGATIVE NEGATIVE Final    Comment: (NOTE) The Xpert Xpress SARS-CoV-2/FLU/RSV plus assay is intended as an aid in the diagnosis of influenza from Nasopharyngeal swab specimens and should not be used as a sole basis for treatment. Nasal washings and aspirates are unacceptable for Xpert Xpress SARS-CoV-2/FLU/RSV testing.  Fact Sheet for Patients: BloggerCourse.com  Fact Sheet for Healthcare Providers: SeriousBroker.it  This test is not yet approved or cleared by the [place]  FDA and has been authorized for detection and/or diagnosis of SARS-CoV-2 by FDA under an Emergency Use Authorization (EUA). This EUA will remain in effect (meaning this test can be used) for the duration of the COVID-19 declaration under Section 564(b)(1) of the Act, 21 U.S.C. section 360bbb-3(b)(1), unless the authorization is terminated or revoked.     Resp Syncytial Virus by PCR NEGATIVE NEGATIVE Final    Comment: (NOTE) Fact Sheet for Patients: BloggerCourse.com  Fact Sheet for Healthcare Providers: SeriousBroker.it  This test is not yet approved or cleared by the [place]  FDA and has been authorized for detection and/or diagnosis of SARS-CoV-2 by FDA under an Emergency Use Authorization (EUA). This EUA will remain in effect (meaning this test can be used) for the duration of the COVID-19 declaration under Section 564(b)(1) of the Act, 21 U.S.C. section 360bbb-3(b)(1), unless the authorization is terminated or revoked.  Performed at [facility], [address] [place], [place] [address]   MRSA Next Gen by PCR, Nasal     Status: None   Collection Time: 08/17/24  2:05 PM   Specimen: Nasal Mucosa; Nasal Swab  Result Value Ref Range Status   MRSA by PCR Next Gen NOT DETECTED NOT DETECTED Final    Comment: (NOTE) The GeneXpert MRSA Assay (FDA approved for  NASAL specimens only), is one component of a comprehensive MRSA colonization surveillance program. It is not intended to diagnose MRSA infection nor to guide or monitor treatment for MRSA infections. Test performance is not FDA approved in patients less than 32 years old. Performed at [facility], [address], [place], [place] [address]   Body fluid culture w Gram Stain     Status: None   Collection Time: 08/18/24 11:13 AM   Specimen: Pleural Fluid  Result Value Ref Range Status   Specimen Description   Final    PLEURAL Performed at [facility], [address], [place], [place] [address]    Special Requests   Final    NONE Performed at [facility], [address] [place], [place] [address]    Gram Stain   Final    WBC PRESENT,BOTH PMN AND MONONUCLEAR RED BLOOD CELLS PRESENT NO ORGANISMS SEEN    Culture   Final    NO GROWTH 3 DAYS Performed at [facility], [address], [place], [place] [address]    Report Status 08/21/2024 FINAL  Final     Labs: BNP (last 3 results) Recent Labs    04/06/24 1807 04/10/24 0411  BNP 141.6* 90.9   Basic Metabolic Panel: Recent Labs  Lab 08/18/24 0321 08/19/24 0301 08/20/24 0754 08/21/24 0503 08/22/24 0322  NA 140 140 138 138 137  K 3.6 3.2* 4.0 3.7 3.7  CL 105 105 105 104 100  CO2 23 22 24 25  24  GLUCOSE 245* 192* 147* 139* 157*  BUN 16 11 16 15 15   CREATININE 0.78 0.47 0.60 0.55 0.69  CALCIUM  8.6* 8.8* 8.7* 9.0 9.1  MG 1.7 1.6* 1.9  --   --   PHOS 3.1 1.9* 3.2  --   --    Liver Function Tests: Recent Labs  Lab 08/18/24 1142 08/19/24 0301 08/20/24 0754 08/21/24 0503  AST  --  12* 27 11*  ALT  --  6 9 7   ALKPHOS  --  67 61 62  BILITOT  --  0.3 0.5 0.3  PROT 6.8 6.6 6.4* 6.4*  ALBUMIN  --  2.6* 2.4* 2.4*   No results for input(s): LIPASE, AMYLASE in the last 168 hours. No results for input(s): AMMONIA in the last 168 hours. CBC: Recent  Labs  Lab 08/18/24 0321 08/19/24 0301 08/20/24 0754 08/21/24 0503 08/22/24 0322  WBC 12.9* 11.3* 9.5 10.9* 10.2  NEUTROABS  --  8.8* 7.0  --   --   HGB 8.9* 9.2* 8.3* 9.0* 9.3*  HCT 31.1* 32.2* 29.1* 30.8* 31.4*  MCV 87.9 88.5 87.1 86.3 86.3  PLT 495* 560* 498* 562* 533*   Cardiac Enzymes: No results for input(s): CKTOTAL, CKMB, CKMBINDEX, TROPONINI in the last 168 hours. BNP: Invalid input(s): POCBNP CBG: Recent Labs  Lab 08/23/24 1140 08/23/24 1709 08/23/24 2159 08/24/24 0802 08/24/24 1204  GLUCAP 375* 448* 495* 365* 262*   D-Dimer No results for input(s): DDIMER in the last 72 hours. Hgb A1c No results for input(s): HGBA1C in the last 72 hours. Lipid Profile No results for input(s): CHOL, HDL, LDLCALC, TRIG, CHOLHDL, LDLDIRECT in the last 72 hours. Thyroid  function studies No results for input(s): TSH, T4TOTAL, T3FREE, THYROIDAB in the last 72 hours.  Invalid input(s): FREET3 Anemia work up No results for input(s): VITAMINB12, FOLATE, FERRITIN, TIBC, IRON, RETICCTPCT in the last 72 hours. Urinalysis No results found for: COLORURINE, APPEARANCEUR, LABSPEC, PHURINE, GLUCOSEU, HGBUR, BILIRUBINUR, KETONESUR, PROTEINUR, UROBILINOGEN, NITRITE, LEUKOCYTESUR Sepsis Labs Recent Labs  Lab 08/19/24 0301 08/20/24 0754 08/21/24 0503 08/22/24 0322  WBC 11.3* 9.5 10.9* 10.2   Microbiology Recent Results (from the past 240 hours)  Resp panel by RT-PCR (RSV, Flu A&B, Covid) Anterior Nasal Swab     Status: None   Collection Time: 08/16/24  9:24 PM   Specimen: Anterior Nasal Swab  Result Value Ref Range Status   SARS Coronavirus 2 by RT PCR NEGATIVE NEGATIVE Final    Comment: (NOTE) SARS-CoV-2 target nucleic acids are NOT DETECTED.  The SARS-CoV-2 RNA is generally detectable in upper respiratory specimens during the acute phase of infection. The lowest concentration of SARS-CoV-2 viral copies this  assay can detect is 138 copies/mL. A negative result does not preclude SARS-Cov-2 infection and should not be used as the sole basis for treatment or other patient management decisions. A negative result may occur with  improper specimen collection/handling, submission of specimen other than nasopharyngeal swab, presence of viral mutation(s) within the areas targeted by this assay, and inadequate number of viral copies(<138 copies/mL). A negative result must be combined with clinical observations, patient history, and epidemiological information. The expected result is Negative.  Fact Sheet for Patients:  BloggerCourse.com  Fact Sheet for Healthcare Providers:  SeriousBroker.it  This test is no t yet approved or cleared by the [place]  FDA and  has been authorized for detection and/or diagnosis of SARS-CoV-2 by FDA under an Emergency Use Authorization (EUA). This EUA will remain  in effect (meaning this test can  be used) for the duration of the COVID-19 declaration under Section 564(b)(1) of the Act, 21 U.S.C.section 360bbb-3(b)(1), unless the authorization is terminated  or revoked sooner.       Influenza A by PCR NEGATIVE NEGATIVE Final   Influenza B by PCR NEGATIVE NEGATIVE Final    Comment: (NOTE) The Xpert Xpress SARS-CoV-2/FLU/RSV plus assay is intended as an aid in the diagnosis of influenza from Nasopharyngeal swab specimens and should not be used as a sole basis for treatment. Nasal washings and aspirates are unacceptable for Xpert Xpress SARS-CoV-2/FLU/RSV testing.  Fact Sheet for Patients: BloggerCourse.com  Fact Sheet for Healthcare Providers: SeriousBroker.it  This test is not yet approved or cleared by the [place]  FDA and has been authorized for detection and/or diagnosis of SARS-CoV-2 by FDA under an Emergency Use Authorization (EUA). This EUA will  remain in effect (meaning this test can be used) for the duration of the COVID-19 declaration under Section 564(b)(1) of the Act, 21 U.S.C. section 360bbb-3(b)(1), unless the authorization is terminated or revoked.     Resp Syncytial Virus by PCR NEGATIVE NEGATIVE Final    Comment: (NOTE) Fact Sheet for Patients: BloggerCourse.com  Fact Sheet for Healthcare Providers: SeriousBroker.it  This test is not yet approved or cleared by the [place]  FDA and has been authorized for detection and/or diagnosis of SARS-CoV-2 by FDA under an Emergency Use Authorization (EUA). This EUA will remain in effect (meaning this test can be used) for the duration of the COVID-19 declaration under Section 564(b)(1) of the Act, 21 U.S.C. section 360bbb-3(b)(1), unless the authorization is terminated or revoked.  Performed at [facility], [address], [place], [place] [address]   MRSA Next Gen by PCR, Nasal     Status: None   Collection Time: 08/17/24  2:05 PM   Specimen: Nasal Mucosa; Nasal Swab  Result Value Ref Range Status   MRSA by PCR Next Gen NOT DETECTED NOT DETECTED Final    Comment: (NOTE) The GeneXpert MRSA Assay (FDA approved for NASAL specimens only), is one component of a comprehensive MRSA colonization surveillance program. It is not intended to diagnose MRSA infection nor to guide or monitor treatment for MRSA infections. Test performance is not FDA approved in patients less than 49 years old. Performed at [facility], [address] [place], [place] [address]   Body fluid culture w Gram Stain     Status: None   Collection Time: 08/18/24 11:13 AM   Specimen: Pleural Fluid  Result Value Ref Range Status   Specimen Description   Final    PLEURAL Performed at [facility], [address], [place], [place] [address]    Special Requests   Final    NONE Performed at  [facility], [address] [place], [place] [address]    Gram Stain   Final    WBC PRESENT,BOTH PMN AND MONONUCLEAR RED BLOOD CELLS PRESENT NO ORGANISMS SEEN    Culture   Final    NO GROWTH 3 DAYS Performed at [facility], [address], [place], [place] [address]    Report Status 08/21/2024 FINAL  Final    Procedures/Studies: DG CHEST PORT 1 VIEW Result Date: 08/22/2024 CLINICAL DATA:  Respiratory failure EXAM: PORTABLE CHEST 1 VIEW COMPARISON:  08/21/2024 FINDINGS: Large right pleural effusion again noted. Continued right lower lobe airspace disease. Improving right upper lobe airspace disease. Patchy airspace disease throughout the left lung, stable. Heart likely mildly  enlarged. Aortic atherosclerosis. IMPRESSION: Large right pleural effusion with some improvement in the right upper lobe consolidation. Otherwise no change. Electronically Signed   By: Franky Crease M.D.   On: 08/22/2024 14:28   DG Chest Port 1 View Result Date: 08/21/2024 CLINICAL DATA:  Status post right thoracentesis yielding 750 cc, known lung cancer EXAM: PORTABLE CHEST 1 VIEW COMPARISON:  08/20/2024, 08/17/2019 FINDINGS: Minimal improvement in the right effusion following thoracentesis. Residual right effusion remains. Similar diffuse mixed interstitial and airspace opacities with right lower lung collapse/consolidation. No significant left effusion. No pneumothorax. Known right lower lobe central obstructing mass is obscured and better demonstrated by comparison PET-CT. No acute osseous finding. IMPRESSION: 1. Minimal improvement in the right effusion following thoracentesis. No pneumothorax. 2. Similar diffuse mixed interstitial and airspace opacities with right lower lung collapse/consolidation. Electronically Signed   By: CHRISTELLA.  Shick M.D.   On: 08/21/2024 17:28   IR THORACENTESIS ASP PLEURAL SPACE W/IMG GUIDE Result Date: 08/21/2024 INDICATION: Patient with history of lung cancer, dyspnea,  recurrent right pleural effusion. Request received for therapeutic right thoracentesis. EXAM: ULTRASOUND GUIDED THERAPEUTIC RIGHT THORACENTESIS MEDICATIONS: 8 mL 1% lidocaine  with epinephrine  COMPLICATIONS: None immediate. PROCEDURE: An ultrasound guided thoracentesis was thoroughly discussed with the patient and questions answered. The benefits, risks, alternatives and complications were also discussed. The patient understands and wishes to proceed with the procedure. Written consent was obtained. Ultrasound was performed to localize and mark an adequate pocket of fluid in the right chest. The area was then prepped and draped in the normal sterile fashion. 1% Lidocaine  was used for local anesthesia. Under ultrasound guidance a 6 Fr Safe-T-Centesis catheter was introduced. Thoracentesis was performed. The catheter was removed and a dressing applied. FINDINGS: A total of approximately 750 cc of slightly hazy, yellow fluid was removed. IMPRESSION: Successful ultrasound guided therapeutic right thoracentesis yielding 750 cc of pleural fluid. Performed by: Franky Rakers, PA-C Electronically Signed   By: CHRISTELLA.  Shick M.D.   On: 08/21/2024 16:21   DG CHEST PORT 1 VIEW Result Date: 08/20/2024 CLINICAL DATA:  Follow-up right pleural effusion. EXAM: PORTABLE CHEST 1 VIEW COMPARISON:  08/19/2024 and chest CTA dated 08/16/2024. FINDINGS: The heart size can not be assessed due to obscuration of the right heart border. No significant change in confluence and patchy increased density in the right mid and lower lung zones. No significant change in chronic bilateral interstitial lung disease and right lung bullous changes. Stable laterally tracking pleural fluid on the right. Tortuous and partially calcified thoracic aorta. Unremarkable bones. IMPRESSION: 1. No significant change in right mid and lower lung zone pneumonia/atelectasis and right pleural effusion. 2. Stable chronic bilateral interstitial lung disease and right lung  bullous changes. Electronically Signed   By: Elspeth Bathe M.D.   On: 08/20/2024 10:49   DG CHEST PORT 1 VIEW Result Date: 08/19/2024 CLINICAL DATA:  Shortness of breath. EXAM: PORTABLE CHEST 1 VIEW COMPARISON:  08/18/2024. FINDINGS: The cardio pericardial silhouette is enlarged. Right base collapse/consolidation with moderate right effusion is similar. Background diffuse interstitial opacity is similar. No left pleural effusion. Bones are diffusely demineralized. Telemetry leads overlie the chest. IMPRESSION: No substantial interval change. Right base collapse/consolidation with moderate right effusion. Electronically Signed   By: Camellia Candle M.D.   On: 08/19/2024 09:17   DG CHEST PORT 1 VIEW Result Date: 08/18/2024 CLINICAL DATA:  Pleural effusion.  Lung carcinoma.  Thoracentesis. EXAM: PORTABLE CHEST 1 VIEW COMPARISON:  05/18/2024 FINDINGS: Moderate right pleural effusion shows increase in size  since prior exam. Increased right lower lung atelectasis. No pneumothorax visualized. Chronic interstitial lung disease again noted. Stable heart size. IMPRESSION: Increased size of moderate right pleural effusion and right lower lung atelectasis. No pneumothorax visualized. Chronic interstitial lung disease. Electronically Signed   By: Norleen DELENA Kil M.D.   On: 08/18/2024 11:57   CT Angio Chest PE W and/or Wo Contrast Result Date: 08/16/2024 CLINICAL DATA:  Shortness of breath, hemoptysis. History of lung cancer. EXAM: CT ANGIOGRAPHY CHEST WITH CONTRAST TECHNIQUE: Multidetector CT imaging of the chest was performed using the standard protocol during bolus administration of intravenous contrast. Multiplanar CT image reconstructions and MIPs were obtained to evaluate the vascular anatomy. RADIATION DOSE REDUCTION: This exam was performed according to the departmental dose-optimization program which includes automated exposure control, adjustment of the mA and/or kV according to patient size and/or use of iterative  reconstruction technique. CONTRAST:  75mL OMNIPAQUE  IOHEXOL  350 MG/ML SOLN COMPARISON:  July 16, 2024.  June 28, 2024. FINDINGS: Cardiovascular: Satisfactory opacification of the pulmonary arteries to the segmental level. No evidence of pulmonary embolism. Mild cardiomegaly. No pericardial effusion. Mediastinum/Nodes: Thyroid  gland is unremarkable. The esophagus is unremarkable. Extensive adenopathy is noted in the subcarinal and right hilar regions as noted on recent PET scan. This mass appears to extend into and occlude the right lower lobe bronchus proximally. Lungs/Pleura: No pneumothorax is noted. Emphysematous disease is noted. 3.7 cm mass noted posteriorly in left lower lobe consistent with metastatic disease. Moderate size right pleural effusion is noted with adjacent atelectasis or infiltrate of residual right upper lobe. Upper Abdomen: No acute abnormality. Musculoskeletal: No chest wall abnormality. No acute or significant osseous findings. Review of the MIP images confirms the above findings. IMPRESSION: 1. No definite evidence of pulmonary embolus. 2. Extensive subcarinal and right hilar adenopathy is noted as noted on recent PET scan. This mass appears to extend into and occlude the right lower lobe bronchus proximally. 3. Moderate size right pleural effusion is noted with adjacent atelectasis or infiltrate of residual right upper lobe. 4. 3.7 cm mass noted posteriorly in left lower lobe consistent with metastatic disease. 5. Emphysema. Aortic Atherosclerosis (ICD10-I70.0) and Emphysema (ICD10-J43.9). Electronically Signed   By: Lynwood Landy Raddle M.D.   On: 08/16/2024 17:30     Time coordinating discharge: Over 30 minutes    Alm Apo, MD  [facility] 08/24/2024, 12:24 PM

## 2024-08-24 NOTE — Progress Notes (Signed)
 Progress Note    Lindsey Williamson   FMW:[id number]  DOB: 01-10-1951  DOA: 08/16/2024     8 PCP: [name], MD  Initial CC: SOB, hemoptysis  Hospital Course:  73 y.o. female with a hx of RLL cancer, ILD, COPD, DM2, CAD, HTN, HLD, chronic respiratory failure who presented with Hemoptysis and progressive dyspnea since her prior hospitalization.  She was treated with TXA nebs with improvement in hemoptysis.  She was also found to have worsening pleural effusion status post thoracentesis awaiting studies.  Consulted pulmonary and cardiology.    Assessment and Plan:  Right Sided Pleural Effusion Acute on chronic hypoxic respiratory failure -  Pulmonary pursing Thoracentesis for further evaluation and she had 500 mL off. - Cytology from thoracentesis on 08/18/2024 noted to have malignant cells -Oncology aware with no change recommended and plans for now - Pulmonology discussing potential PleurX catheter; patient wishes to think about it and discuss further outpatient - Had further decompensation with respiratory status after repeat thoracentesis on 08/21/2024 - Treated with Lasix  intermittently; diuresed well - Weaning oxygen as able; back to SDU on 8/26 and placed on HHFNC; oxygen able to be weaned down to 3 to 4 L prior to discharge - Would benefit from serial thoracentesis at discharge versus consideration of PleurX catheter; further discussions to continue with palliative care and oncology at discharge  NSCLC stage 3a GOC discussions - Diagnosed in October 2020 for 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 Follows with Dr. Sherrod and currently on palliative systemic chemotherapy with carboplatin  and paclitaxel  as well as Keytruda  - Palliative care met with patient during hospitalization; ongoing discussions to continue at discharge  Hemoptysis - resolved  -  Improved with TXA nebs and Most likely secondary to  underlying malignancy - Seen by PCCM/pulmonary - If recurs, recommendation would be for consideration of IR evaluation for possible embolization - s/p TXA  COPD exacerbation ILD - Continue supplemental O2; on 2L at home - Completed Rocephin  course -Completed steroid course; caused significant hyperglycemia.  Not on steroids on admission and therefore will not continue further at discharge -Continue breathing treatments   Sinus tachycardia versus atrial tachycardia: Continue to monitor on telemetry Seen by cardiology they feel that her heart rate is improved with improvement in her pulm status.  They believe this is most likely sinus tachycardia but she may have had a brief atrial tachycardia episode in the ED. - Tolerating bisoprolol  and Cardizem .  Amlodipine  has been discontinued -Zio patch being planned for at discharge   Metabolic acidosis - s/p treatment   Essential Hypertension - BP regimen modified after admission - Continue bisoprolol  and Cardizem    Hypokalemia Hypophosphatemia Hypomagnesemia -Repleted  Normocytic Anemia - Hgb/Hct Trend relatively stable and last check was 9.2/32.2 w/ MCV of 88.5. Checked Anemia Panel and showed iron level of 27, TIBC 194, TIBC 221, saturation of 12%, ferritin 1169, vitamin B12 1832.  CTM for S/Sx of Bleeding; No overt bleeding noted   Diabetes Mellitus Type 2 - Hemoglobin A1c 9.3, elevated; At risk for hyperglycemia due to steroid - NovoLog  sliding scale and Monitor CBG, diabetic diet   CAD HLD -No further mortality benefit with statin, discontinued   Anxiety -Atarax  25 mg p.o. 3 times daily  Overweight: Complicates overall prognosis and care. Estimated body mass index is 27.55 kg/m as calculated from the following:   Height as of this encounter: 5' 4 (1.626 m).  Weight as of this encounter: 72.8 kg. Weight Loss and Dietary Counseling given  Interval History:  Tried to discharge but patient too deconditioned and frail.   Steps to get up at home and now getting dizzy when standing and low BP.  Unclear dispo now. Doubt wants to go to SNF nor would help much. She's just declining further possibly.  If can't d/c again on Saturday, will need to reengage palliative care to discuss further.    Old records reviewed in assessment of this patient  Antimicrobials: Rocephin  08/16/2024 >> 08/22/2024  DVT prophylaxis:  SCDs Start: 08/16/24 2332   Code Status:   Code Status: Limited: Do not attempt resuscitation (DNR) -DNR-LIMITED -Do Not Intubate/DNI   Mobility Assessment (Last 72 Hours)     Mobility Assessment     Row Name 08/24/24 0800 08/23/24 2000 08/23/24 0800 08/22/24 2000 08/22/24 0800   Does the patient have exclusion criteria? No - Perform mobility assessment No - Perform mobility assessment No - Perform mobility assessment No - Perform mobility assessment No - Perform mobility assessment   What is the highest level of mobility based on the mobility assessment? Level 4 (Ambulates with assistance) - Balance while stepping forward/back - Complete Level 4 (Ambulates with assistance) - Balance while stepping forward/back - Complete Level 4 (Ambulates with assistance) - Balance while stepping forward/back - Complete Level 4 (Ambulates with assistance) - Balance while stepping forward/back - Complete Level 5 (Ambulates independently) - Balance while walking independently - Complete   Is the above level different from baseline mobility prior to current illness? No - Consider discontinuing PT/OT -- No - Consider discontinuing PT/OT -- No - Consider discontinuing PT/OT    Row Name 08/21/24 1945           Does the patient have exclusion criteria? No - Perform mobility assessment       What is the highest level of mobility based on the mobility assessment? Level 4 (Ambulates with assistance) - Balance while stepping forward/back - Complete          Barriers to discharge: None Disposition Plan: Home?? HH orders  placed: TBD Status is: Inpatient  Objective: Blood pressure (!) 126/49, pulse 70, temperature 98.4 F (36.9 C), temperature source Oral, resp. rate 16, height 5' 4 (1.626 m), weight 76.1 kg, SpO2 94%.  Examination:  Physical Exam Constitutional:      Appearance: Normal appearance.  HENT:     Head: Normocephalic and atraumatic.     Mouth/Throat:     Mouth: Mucous membranes are moist.  Eyes:     Extraocular Movements: Extraocular movements intact.  Cardiovascular:     Rate and Rhythm: Normal rate and regular rhythm.  Pulmonary:     Effort: Pulmonary effort is normal. No respiratory distress.     Breath sounds: Examination of the right-middle field reveals decreased breath sounds. Examination of the right-lower field reveals decreased breath sounds. Decreased breath sounds (Slowly improving) present. No wheezing.  Abdominal:     General: Bowel sounds are normal. There is no distension.     Palpations: Abdomen is soft.     Tenderness: There is no abdominal tenderness.  Musculoskeletal:        General: Normal range of motion.     Cervical back: Normal range of motion and neck supple.  Skin:    General: Skin is warm and dry.  Neurological:     General: No focal deficit present.     Mental Status: She is alert.  Psychiatric:  Mood and Affect: Mood normal.      Consultants:  Pulmonology   Procedures:    Data Reviewed: Results for orders placed or performed during the hospital encounter of 08/16/24 (from the past 24 hours)  Glucose, capillary     Status: Abnormal   Collection Time: 08/23/24  5:09 PM  Result Value Ref Range   Glucose-Capillary 448 (H) 70 - 99 mg/dL   Comment 1 Notify RN    Comment 2 Document in Chart   Glucose, capillary     Status: Abnormal   Collection Time: 08/23/24  9:59 PM  Result Value Ref Range   Glucose-Capillary 495 (H) 70 - 99 mg/dL  Glucose, capillary     Status: Abnormal   Collection Time: 08/24/24  8:02 AM  Result Value Ref Range    Glucose-Capillary 365 (H) 70 - 99 mg/dL  Glucose, capillary     Status: Abnormal   Collection Time: 08/24/24 12:04 PM  Result Value Ref Range   Glucose-Capillary 262 (H) 70 - 99 mg/dL   Comment 1 Notify RN    Comment 2 Document in Chart     I have reviewed pertinent nursing notes, vitals, labs, and images as necessary. I have ordered labwork to follow up on as indicated.  I have reviewed the last notes from staff over past 24 hours. I have discussed patient's care plan and test results with nursing staff, CM/SW, and other staff as appropriate.  Time spent: Greater than 50% of the 55 minute visit was spent in counseling/coordination of care for the patient as laid out in the A&P.   LOS: 8 days   Alm Apo, MD [facility] 08/24/2024, 3:06 PM

## 2024-08-24 NOTE — Progress Notes (Signed)
 NAME:  ADYN HOES, MRN:  [id number], DOB:  04/29/1951, LOS: 8 ADMISSION DATE:  08/16/2024, CONSULTATION DATE: 08/17/2024 REFERRING MD: Dr. 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  Followed in our office by Dr Neda, chronic hypoxemic respiratory failure on continuous O2.  Repeat bronchoscopy 05/19/2024 for hemoptysis showed an endobronchial lesion in the bronchus intermedius extending to the right lower lobe with associated bleeding.  Followed conservatively. She underwent chemoradiation with right upper lobe XRT, and then Imfinzi  complicated by associated pneumonitis June 2025 treated with steroids.  Most recent imaging included PET scan 07/16/2024 that showed progressive disease in the right lower lobe with associated complete atelectasis, contralateral left lower lobe metastasis (versus primary), progressive hilar and mediastinal adenopathy.  Current therapy is carboplatin /Paclitaxol + Keytruda .  Has continued to have cough, tried Hydromet with only partial success.  Her treatment was delayed and she was treated for possible bronchitis with levofloxacin  08/13/2024. Admitted now with progressive dyspnea, increased hemoptysis since the May hospitalization and bronchoscopy.  In the emergency department she received TXA nebs. She describes bloody mucous, at it's heaviest about 1/2 a cup on day prior to presentation. Usually bright red, sometimes dark spotty clots. Last time she saw any was this am - scant dark blood (after the TXA).    Pertinent  Medical History   Past Medical History:  Diagnosis Date   Angioedema 10/17/2023   Arthritis    Diabetes mellitus without complication (HCC)    Hypertension    Significant Hospital Events: Including  procedures, antibiotic start and stop dates in addition to other pertinent events   8/21 CT-PA >> no pulmonary embolism, extensive subcarinal and right hilar adenopathy that appears to extend and occlude the right lower lobe bronchus proximally.  3.7 cm left lower lobe mass.  Moderate right pleural effusion with associated right upper lobe atelectasis 8/24 thoracentesis for 500 amber-colored fluid, exudative 8/26 thoracentesis by IR, removed. Developed re-expansion pulmonary edema post-procedure, transferred back to step down unit  Interim History / Subjective:   No overnight events Emotional Denies any significant shortness of breath On oxygen supplementation  Objective    Blood pressure (!) 124/53, pulse 79, temperature 98.6 F (37 C), temperature source Oral, resp. rate 18, height 5' 4 (1.626 m), weight 76.1 kg, SpO2 93%.        Intake/Output Summary (Last 24 hours) at 08/24/2024 1049 Last data filed at 08/24/2024 0600 Gross per 24 hour  Intake --  Output 650 ml  Net -650 ml   Filed Weights   08/22/24 0500 08/23/24 0500 08/24/24 0416  Weight: 72.3 kg 73.5 kg 76.1 kg    Examination: General: Chronically ill-appearing, on supplemental oxygen HENT: Moist oral mucosa Lungs: Decreased breath sounds bibasilarly Cardiovascular: S1-S2 appreciated no clubbing, no edema Abdomen: Soft, bowel sounds appreciated Extremities: No clubbing, no edema Neuro: Awake alert moving extremities appropriately GU:   I reviewed last 24 h vitals and pain scores, last 48 h intake and output, last 24 h labs and trends, and last 24 h imaging results. 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 Had an initial cancer that was adenocarcinoma of the right upper lobe for which she had radiation treatment in 2023 - She remains on palliative systemic chemotherapy with carboplatin  and paclitaxel , Keytruda  -Appreciate palliative care involvement -She has an appointment to follow-up with oncology September 2  Malignant pleural effusion Postthoracentesis 8/23, 8/27 with resultant X reexpansion pulmonary edema leading to more shortness of breath - Cytology on 823 suspicious for malignant cells but this is likely malignant - Will benefit from Pleurx - She is actively thinking about whether she wants a Pleurx placed or intermittent thoracentesis as tolerated  Hemoptysis - Initial reason for admission has resolved, required tranexamic acid   Options of management of hemoptysis if it recurs may include bronchial artery embolization Flexible bronchoscopy unlikely to provide any information that helpful  Patient is stable from a respiratory perspective be considered for discharge She is quite frail and the risk of readmission is high as she has poor reserves Appreciate palliative care involvement in ongoing goals of care She is DNR status  Jennet Epley, MD [place] [facility] Pager: See Tracey

## 2024-08-24 NOTE — Progress Notes (Signed)
 Occupational Therapy Treatment Patient Details Name: Lindsey Williamson MRN: [id number] DOB: 08-19-1951 Today's Date: 08/24/2024   History of present illness Pt admitted from home with hemoptysis, COPD exacerbation, metabolic acidosis.  Pt s/p thoracentesis 08/18/24. PMH: DM, HTN, angioedema, COPD on 2L home O2, CAD s/p CABG, and stage 3 Lung CA   OT comments  The pt was received seated EOB. OT educated the pt on energy conservation strategies to implement as needed during ADLs and/or household IADLs; relevant handout reviewed and issued. Pt further required set-up assist for upper body dressing and min assist for lower body dressing. Upon sit to stand, she reported severe dizziness and lightheadedness, and needed to sit down shortly after. Her blood pressure was noted to be 134/53 seated EOB and 107/48 in standing;  pt and her spouse expressed concerns about pt's readiness to discharge home, given her symptoms, her having a history of repeat hospitalizations, and due to her not being able to progress her activity as desired during her hospital stay; nurse informed.Pt would like to progress her overall activity next session, if able to safely due so. Continue OT plan of care. Home health OT recommended.       If plan is discharge home, recommend the following:  Assistance with cooking/housework;Help with stairs or ramp for entrance;Assist for transportation;A little help with bathing/dressing/bathroom   Equipment Recommendations  BSC/3in1    Recommendations for Other Services      Precautions / Restrictions Precautions Precautions: Fall Precaution/Restrictions Comments: monitor O2 saturation and blood pressure Restrictions Other Position/Activity Restrictions: O2 use at baseline       Mobility Bed Mobility      General bed mobility comments:  (pt was received seated EOB)    Transfers Overall transfer level: Needs assistance Equipment used: Rolling walker (2 wheels) Transfers: Sit  to/from Stand, Bed to chair/wheelchair/BSC Sit to Stand: Contact guard assist     Step pivot transfers: Contact guard assist           Balance     Sitting balance-Leahy Scale: Poor       Standing balance-Leahy Scale: Fair            ADL either performed or assessed with clinical judgement   ADL Overall ADL's : Needs assistance/impaired                 Upper Body Dressing : Set up;Sitting Upper Body Dressing Details (indicate cue type and reason): She donned an over head shirt seated EOB. Lower Body Dressing: Minimal assistance;Sit to/from stand Lower Body Dressing Details (indicate cue type and reason): She required assist to donn her slide in shoes and pants seated EOB.                              Communication Communication Communication: No apparent difficulties   Cognition Arousal: Alert Behavior During Therapy: WFL for tasks assessed/performed          Following commands: Intact        Cueing   Cueing Techniques: Verbal cues        General Comments pt able to maintain O2 saturation >/=91% on 4 L, no reports of SOB, pt productive cough with bloody discharge and reports of dizziness seated EOB and with standing tasks    Pertinent Vitals/ Pain       Pain Assessment Pain Assessment: No/denies pain   Frequency  Min 2X/week  Progress Toward Goals  OT Goals(current goals can now be found in the care plan section)     Acute Rehab OT Goals Patient Stated Goal: to get stronger OT Goal Formulation: With patient/family Time For Goal Achievement: 09/02/24 Potential to Achieve Goals: Good  Plan      Co-evaluation      Reason for Co-Treatment: Complexity of the patient's impairments (multi-system involvement);For patient/therapist safety PT goals addressed during session: Mobility/safety with mobility;Balance;Proper use of DME        AM-PAC OT 6 Clicks Daily Activity     Outcome Measure   Help from another  person eating meals?: None Help from another person taking care of personal grooming?: A Little Help from another person toileting, which includes using toliet, bedpan, or urinal?: A Little Help from another person bathing (including washing, rinsing, drying)?: A Little Help from another person to put on and taking off regular upper body clothing?: A Little Help from another person to put on and taking off regular lower body clothing?: A Little 6 Click Score: 19    End of Session Equipment Utilized During Treatment: Gait belt;Rolling walker (2 wheels);Oxygen  OT Visit Diagnosis: Muscle weakness (generalized) (M62.81)   Activity Tolerance Other (comment) (Limited by dizziness and lightheadedness with activity)   Patient Left in chair;with call bell/phone within reach;with chair alarm set   Nurse Communication Other (comment) (Pt's dizziness and drop in blood pressure upon standing)        Time: 1430-1510 OT Time Calculation (min): 40 min  Charges: OT General Charges $OT Visit: 1 Visit OT Treatments $Self Care/Home Management : 8-22 mins $Therapeutic Activity: 8-22 mins     Delanna JINNY Lesches, OTR/L 08/24/2024, 5:28 PM

## 2024-08-25 ENCOUNTER — Other Ambulatory Visit (HOSPITAL_COMMUNITY): Payer: Self-pay

## 2024-08-25 DIAGNOSIS — J441 Chronic obstructive pulmonary disease with (acute) exacerbation: Secondary | ICD-10-CM | POA: Diagnosis not present

## 2024-08-25 LAB — GLUCOSE, CAPILLARY
Glucose-Capillary: 283 mg/dL — ABNORMAL HIGH (ref 70–99)
Glucose-Capillary: 260 mg/dL — ABNORMAL HIGH (ref 70–99)

## 2024-08-25 MED ORDER — GLIPIZIDE ER 5 MG PO TB24
5.0000 mg | ORAL_TABLET | Freq: Every day | ORAL | Status: DC
  Administered 2024-08-25: 5 mg via ORAL
  Filled 2024-08-25: qty 1

## 2024-08-25 MED ORDER — INSULIN ASPART 100 UNIT/ML IJ SOLN
0.0000 [IU] | Freq: Three times a day (TID) | INTRAMUSCULAR | Status: DC
  Administered 2024-08-25: 3 [IU] via SUBCUTANEOUS

## 2024-08-25 MED ORDER — METFORMIN HCL ER 500 MG PO TB24
1000.0000 mg | ORAL_TABLET | Freq: Every day | ORAL | Status: DC
  Administered 2024-08-25: 1000 mg via ORAL
  Filled 2024-08-25: qty 2

## 2024-08-25 MED ORDER — INSULIN ASPART 100 UNIT/ML IJ SOLN: 0.0000 [IU] | Freq: Three times a day (TID) | INTRAMUSCULAR | Status: DC

## 2024-08-25 NOTE — Progress Notes (Signed)
 Occupational Therapy Treatment Patient Details Name: Lindsey Williamson MRN: [id number] DOB: [birth year] Today's Date: 08/25/2024   History of present illness Pt admitted from home 2* hemoptysis, COPD exacerbation, metabolic acidosis.  Pt s/p thoracentesis 08/18/24.  Pt with hx of DM, htn, angioedema, COPD on 2L home O2, CAD s/p CABG, and stg 3 Lung CA   OT comments  Pt making incremental progress towards OT goals. Emphasis on assessment of orthostatics and energy conservation with activity. Orthostatics negative during session but dizziness reported with static standing. Pt able to manage in-room mobility using RW and LB dressing with no more than CGA w/ rest breaks as needed. Pt with continued concerns regarding BP/dizziness, blood sugar issues and varying respiratory status during admission. Pt could benefit from rehab stay > 3 hours per day to address medical issues and functional abilities. Pt open to consider this though hopeful to resolve issues for return home with HHOT. Will continue to monitor.   BP supine: 133/54 BP sitting: 131/59 BP standing: 131/62 (dizzy, required seated rest break) BP post activity: 146/63  SpO2 to 85% on 4 L O2 after activity; improved to 91% within 1 min with pursed lip breathing      If plan is discharge home, recommend the following:  Assistance with cooking/housework;Help with stairs or ramp for entrance;Assist for transportation;A little help with bathing/dressing/bathroom;A little help with walking and/or transfers   Equipment Recommendations  BSC/3in1    Recommendations for Other Services Rehab consult    Precautions / Restrictions Precautions Precautions: Fall Precaution/Restrictions Comments: monitor O2 and orthostatics Restrictions Weight Bearing Restrictions Per Provider Order: No       Mobility Bed Mobility Overal bed mobility: Modified Independent                  Transfers Overall transfer level: Needs assistance Equipment  used: Rolling walker (2 wheels) Transfers: Sit to/from Stand Sit to Stand: Contact guard assist           General transfer comment: good carryover pushing up from bed with BUE     Balance Overall balance assessment: Needs assistance Sitting-balance support: No upper extremity supported, Feet supported Sitting balance-Leahy Scale: Fair     Standing balance support: Bilateral upper extremity supported Standing balance-Leahy Scale: Poor                             ADL either performed or assessed with clinical judgement   ADL Overall ADL's : Needs assistance/impaired                     Lower Body Dressing: Contact guard assist;Sit to/from stand;Sitting/lateral leans Lower Body Dressing Details (indicate cue type and reason): Able to don socks with setup EOB; encouraged figure four position for energy conservation. would need CGA in standing             Functional mobility during ADLs: Contact guard assist;Rolling walker (2 wheels) General ADL Comments: Emphasis on energy conservation, assessing BP with certain tasks, as well as SpO2    Extremity/Trunk Assessment Upper Extremity Assessment Upper Extremity Assessment: Generalized weakness;Right hand dominant   Lower Extremity Assessment Lower Extremity Assessment: Defer to PT evaluation        Vision   Vision Assessment?: No apparent visual deficits   Perception     Praxis     Communication Communication Communication: No apparent difficulties   Cognition Arousal: Alert Behavior During Therapy: [facility] for tasks assessed/performed  Cognition: No apparent impairments                               Following commands: Intact        Cueing   Cueing Techniques: Verbal cues  Exercises      Shoulder Instructions       General Comments      Pertinent Vitals/ Pain       Pain Assessment Pain Assessment: No/denies pain  Home Living                                           Prior Functioning/Environment              Frequency  Min 2X/week        Progress Toward Goals  OT Goals(current goals can now be found in the care plan section)  Progress towards OT goals: Progressing toward goals  Acute Rehab OT Goals Patient Stated Goal: control BP and blood sugar OT Goal Formulation: With patient/family Time For Goal Achievement: 09/02/24 Potential to Achieve Goals: Good ADL Goals Pt Will Perform Grooming: with modified independence;standing Pt Will Perform Lower Body Dressing: with modified independence;sitting/lateral leans;sit to/from stand Pt Will Transfer to Toilet: with modified independence;ambulating Pt Will Perform Toileting - Clothing Manipulation and hygiene: with modified independence;sit to/from stand Additional ADL Goal #1: The pt will independently verbalize and/or implement at least 3 energy conservation strategies to implement as needed during self-care tasks, to facilitate improved overall activity tolerance.  Plan      Co-evaluation                 AM-PAC OT 6 Clicks Daily Activity     Outcome Measure   Help from another person eating meals?: None Help from another person taking care of personal grooming?: A Little Help from another person toileting, which includes using toliet, bedpan, or urinal?: A Little Help from another person bathing (including washing, rinsing, drying)?: A Little Help from another person to put on and taking off regular upper body clothing?: A Little Help from another person to put on and taking off regular lower body clothing?: A Little 6 Click Score: 19    End of Session Equipment Utilized During Treatment: Gait belt;Rolling walker (2 wheels);Oxygen  OT Visit Diagnosis: Muscle weakness (generalized) (M62.81)   Activity Tolerance Patient tolerated treatment well   Patient Left in bed;with call bell/phone within reach;with bed alarm set;with family/visitor present   Nurse  Communication Mobility status;Other (comment) (BP)        Time: 9195-9161 OT Time Calculation (min): 34 min  Charges: OT General Charges $OT Visit: 1 Visit OT Treatments $Self Care/Home Management : 8-22 mins $Therapeutic Activity: 8-22 mins  Mliss NOVAK, OTR/L Acute Rehab Services Office: [phone]   Mliss Fish 08/25/2024, 8:46 AM

## 2024-08-25 NOTE — Progress Notes (Signed)

## 2024-08-25 NOTE — Plan of Care (Signed)
 Problem: Education: Goal: Ability to describe self-care measures that may prevent or decrease complications (Diabetes Survival Skills Education) will improve 08/25/2024 1451 by Terance Leonor BRAVO, RN Outcome: Adequate for Discharge 08/25/2024 0755 by Terance Leonor BRAVO, RN Outcome: Progressing Goal: Individualized Educational Video(s) 08/25/2024 1451 by Terance Leonor BRAVO, RN Outcome: Adequate for Discharge 08/25/2024 0755 by Terance Leonor BRAVO, RN Outcome: Progressing   Problem: Coping: Goal: Ability to adjust to condition or change in health will improve 08/25/2024 1451 by Terance Leonor BRAVO, RN Outcome: Adequate for Discharge 08/25/2024 0755 by Terance Leonor BRAVO, RN Outcome: Progressing   Problem: Fluid Volume: Goal: Ability to maintain a balanced intake and output will improve 08/25/2024 1451 by Terance Leonor BRAVO, RN Outcome: Adequate for Discharge 08/25/2024 0755 by Terance Leonor BRAVO, RN Outcome: Progressing   Problem: Health Behavior/Discharge Planning: Goal: Ability to identify and utilize available resources and services will improve 08/25/2024 1451 by Terance Leonor BRAVO, RN Outcome: Adequate for Discharge 08/25/2024 0755 by Terance Leonor BRAVO, RN Outcome: Progressing Goal: Ability to manage health-related needs will improve 08/25/2024 1451 by Terance Leonor BRAVO, RN Outcome: Adequate for Discharge 08/25/2024 0755 by Terance Leonor BRAVO, RN Outcome: Progressing   Problem: Metabolic: Goal: Ability to maintain appropriate glucose levels will improve 08/25/2024 1451 by Terance Leonor BRAVO, RN Outcome: Adequate for Discharge 08/25/2024 0755 by Terance Leonor BRAVO, RN Outcome: Progressing   Problem: Nutritional: Goal: Maintenance of adequate nutrition will improve 08/25/2024 1451 by Terance Leonor BRAVO, RN Outcome: Adequate for Discharge 08/25/2024 0755 by Terance Leonor BRAVO, RN Outcome: Progressing Goal: Progress toward achieving an optimal weight will  improve 08/25/2024 1451 by Terance Leonor BRAVO, RN Outcome: Adequate for Discharge 08/25/2024 0755 by Terance Leonor BRAVO, RN Outcome: Progressing   Problem: Skin Integrity: Goal: Risk for impaired skin integrity will decrease 08/25/2024 1451 by Terance Leonor BRAVO, RN Outcome: Adequate for Discharge 08/25/2024 0755 by Terance Leonor BRAVO, RN Outcome: Progressing   Problem: Tissue Perfusion: Goal: Adequacy of tissue perfusion will improve 08/25/2024 1451 by Terance Leonor BRAVO, RN Outcome: Adequate for Discharge 08/25/2024 0755 by Terance Leonor BRAVO, RN Outcome: Progressing   Problem: Education: Goal: Knowledge of disease or condition will improve 08/25/2024 1451 by Terance Leonor BRAVO, RN Outcome: Adequate for Discharge 08/25/2024 0755 by Terance Leonor BRAVO, RN Outcome: Progressing Goal: Knowledge of the prescribed therapeutic regimen will improve 08/25/2024 1451 by Terance Leonor BRAVO, RN Outcome: Adequate for Discharge 08/25/2024 0755 by Terance Leonor BRAVO, RN Outcome: Progressing Goal: Individualized Educational Video(s) 08/25/2024 1451 by Terance Leonor BRAVO, RN Outcome: Adequate for Discharge 08/25/2024 0755 by Terance Leonor BRAVO, RN Outcome: Progressing   Problem: Activity: Goal: Ability to tolerate increased activity will improve 08/25/2024 1451 by Terance Leonor BRAVO, RN Outcome: Adequate for Discharge 08/25/2024 0755 by Terance Leonor BRAVO, RN Outcome: Progressing Goal: Will verbalize the importance of balancing activity with adequate rest periods 08/25/2024 1451 by Terance Leonor BRAVO, RN Outcome: Adequate for Discharge 08/25/2024 0755 by Terance Leonor BRAVO, RN Outcome: Progressing   Problem: Respiratory: Goal: Ability to maintain a clear airway will improve 08/25/2024 1451 by Terance Leonor BRAVO, RN Outcome: Adequate for Discharge 08/25/2024 0755 by Terance Leonor BRAVO, RN Outcome: Progressing Goal: Levels of oxygenation will improve 08/25/2024 1451 by Terance Leonor BRAVO, RN Outcome: Adequate for Discharge 08/25/2024 0755 by Terance Leonor BRAVO, RN Outcome: Progressing Goal: Ability to maintain adequate ventilation will improve 08/25/2024 1451 by Terance Leonor BRAVO, RN Outcome: Adequate for Discharge 08/25/2024 0755 by Terance Leonor BRAVO, RN Outcome: Progressing  Problem: Education: Goal: Knowledge of General Education information will improve Description: Including pain rating scale, medication(s)/side effects and non-pharmacologic comfort measures 08/25/2024 1451 by Terance Leonor BRAVO, RN Outcome: Adequate for Discharge 08/25/2024 0755 by Terance Leonor BRAVO, RN Outcome: Progressing   Problem: Health Behavior/Discharge Planning: Goal: Ability to manage health-related needs will improve 08/25/2024 1451 by Terance Leonor BRAVO, RN Outcome: Adequate for Discharge 08/25/2024 0755 by Terance Leonor BRAVO, RN Outcome: Progressing   Problem: Clinical Measurements: Goal: Ability to maintain clinical measurements within normal limits will improve 08/25/2024 1451 by Terance Leonor BRAVO, RN Outcome: Adequate for Discharge 08/25/2024 0755 by Terance Leonor BRAVO, RN Outcome: Progressing Goal: Will remain free from infection 08/25/2024 1451 by Terance Leonor BRAVO, RN Outcome: Adequate for Discharge 08/25/2024 0755 by Terance Leonor BRAVO, RN Outcome: Progressing Goal: Diagnostic test results will improve 08/25/2024 1451 by Terance Leonor BRAVO, RN Outcome: Adequate for Discharge 08/25/2024 0755 by Terance Leonor BRAVO, RN Outcome: Progressing Goal: Respiratory complications will improve 08/25/2024 1451 by Terance Leonor BRAVO, RN Outcome: Adequate for Discharge 08/25/2024 0755 by Terance Leonor BRAVO, RN Outcome: Progressing Goal: Cardiovascular complication will be avoided 08/25/2024 1451 by Terance Leonor BRAVO, RN Outcome: Adequate for Discharge 08/25/2024 0755 by Terance Leonor BRAVO, RN Outcome: Progressing   Problem: Activity: Goal: Risk for  activity intolerance will decrease 08/25/2024 1451 by Terance Leonor BRAVO, RN Outcome: Adequate for Discharge 08/25/2024 0755 by Terance Leonor BRAVO, RN Outcome: Progressing   Problem: Nutrition: Goal: Adequate nutrition will be maintained 08/25/2024 1451 by Terance Leonor BRAVO, RN Outcome: Adequate for Discharge 08/25/2024 0755 by Terance Leonor BRAVO, RN Outcome: Progressing   Problem: Coping: Goal: Level of anxiety will decrease 08/25/2024 1451 by Terance Leonor BRAVO, RN Outcome: Adequate for Discharge 08/25/2024 0755 by Terance Leonor BRAVO, RN Outcome: Progressing   Problem: Elimination: Goal: Will not experience complications related to bowel motility 08/25/2024 1451 by Terance Leonor BRAVO, RN Outcome: Adequate for Discharge 08/25/2024 0755 by Terance Leonor BRAVO, RN Outcome: Progressing Goal: Will not experience complications related to urinary retention 08/25/2024 1451 by Terance Leonor BRAVO, RN Outcome: Adequate for Discharge 08/25/2024 0755 by Terance Leonor BRAVO, RN Outcome: Progressing   Problem: Pain Managment: Goal: General experience of comfort will improve and/or be controlled 08/25/2024 1451 by Terance Leonor BRAVO, RN Outcome: Adequate for Discharge 08/25/2024 0755 by Terance Leonor BRAVO, RN Outcome: Progressing   Problem: Safety: Goal: Ability to remain free from injury will improve 08/25/2024 1451 by Terance Leonor BRAVO, RN Outcome: Adequate for Discharge 08/25/2024 0755 by Terance Leonor BRAVO, RN Outcome: Progressing   Problem: Skin Integrity: Goal: Risk for impaired skin integrity will decrease 08/25/2024 1451 by Terance Leonor BRAVO, RN Outcome: Adequate for Discharge 08/25/2024 0755 by Terance Leonor BRAVO, RN Outcome: Progressing

## 2024-08-25 NOTE — Plan of Care (Signed)
   Problem: Education: Goal: Ability to describe self-care measures that may prevent or decrease complications (Diabetes Survival Skills Education) will improve Outcome: Progressing Goal: Individualized Educational Video(s) Outcome: Progressing   Problem: Coping: Goal: Ability to adjust to condition or change in health will improve Outcome: Progressing   Problem: Fluid Volume: Goal: Ability to maintain a balanced intake and output will improve Outcome: Progressing   Problem: Health Behavior/Discharge Planning: Goal: Ability to identify and utilize available resources and services will improve Outcome: Progressing Goal: Ability to manage health-related needs will improve Outcome: Progressing   Problem: Metabolic: Goal: Ability to maintain appropriate glucose levels will improve Outcome: Progressing   Problem: Nutritional: Goal: Maintenance of adequate nutrition will improve Outcome: Progressing Goal: Progress toward achieving an optimal weight will improve Outcome: Progressing   Problem: Skin Integrity: Goal: Risk for impaired skin integrity will decrease Outcome: Progressing   Problem: Tissue Perfusion: Goal: Adequacy of tissue perfusion will improve Outcome: Progressing   Problem: Education: Goal: Knowledge of disease or condition will improve Outcome: Progressing Goal: Knowledge of the prescribed therapeutic regimen will improve Outcome: Progressing Goal: Individualized Educational Video(s) Outcome: Progressing   Problem: Activity: Goal: Ability to tolerate increased activity will improve Outcome: Progressing Goal: Will verbalize the importance of balancing activity with adequate rest periods Outcome: Progressing   Problem: Respiratory: Goal: Ability to maintain a clear airway will improve Outcome: Progressing Goal: Levels of oxygenation will improve Outcome: Progressing Goal: Ability to maintain adequate ventilation will improve Outcome: Progressing    Problem: Education: Goal: Knowledge of General Education information will improve Description: Including pain rating scale, medication(s)/side effects and non-pharmacologic comfort measures Outcome: Progressing   Problem: Health Behavior/Discharge Planning: Goal: Ability to manage health-related needs will improve Outcome: Progressing   Problem: Clinical Measurements: Goal: Ability to maintain clinical measurements within normal limits will improve Outcome: Progressing Goal: Will remain free from infection Outcome: Progressing Goal: Diagnostic test results will improve Outcome: Progressing Goal: Respiratory complications will improve Outcome: Progressing Goal: Cardiovascular complication will be avoided Outcome: Progressing   Problem: Activity: Goal: Risk for activity intolerance will decrease Outcome: Progressing   Problem: Nutrition: Goal: Adequate nutrition will be maintained Outcome: Progressing   Problem: Coping: Goal: Level of anxiety will decrease Outcome: Progressing   Problem: Elimination: Goal: Will not experience complications related to bowel motility Outcome: Progressing Goal: Will not experience complications related to urinary retention Outcome: Progressing   Problem: Pain Managment: Goal: General experience of comfort will improve and/or be controlled Outcome: Progressing   Problem: Safety: Goal: Ability to remain free from injury will improve Outcome: Progressing   Problem: Skin Integrity: Goal: Risk for impaired skin integrity will decrease Outcome: Progressing

## 2024-08-25 NOTE — Progress Notes (Signed)
 Physician Discharge Summary  Lindsey Williamson FMW:[id number] DOB: 22-Feb-1951 DOA: 08/16/2024 PCP: [name], MD Admit date: 08/16/2024 Discharge date: 08/25/2024 Recommendations for Outpatient Follow-up:  Follow up with PCP in 1 weeks-call for appointment. Please obtain BMP/CBC in one week. F/u with palliative care and pulmonary as outpatient.  Discharge Dispo: home Discharge Condition: Stable Code Status:   Code Status: Limited: Do not attempt resuscitation (DNR) -DNR-LIMITED -Do Not Intubate/DNI  Diet recommendation:  Diet Order             Diet Carb Modified           Diet Carb Modified Fluid consistency: Thin; Room service appropriate? Yes  Diet effective now                   Brief/Interim Summary: 73 y.o. female with a hx of RLL cancer, ILD, COPD, DM2, CAD, HTN, HLD, chronic respiratory failure who presented with Hemoptysis and progressive dyspnea since her prior hospitalization.  She was treated with TXA nebs with improvement in hemoptysis.  She was also found to have worsening pleural effusion status post thoracentesis Seen by pulmonary and cardiology Cytology from thoracentesis on 08/18/2024 noted to have malignant cells>Oncology aware with no change recommended and plans for now, continuing on serial thoracentesis at discharge versus consideration of Pleurx catheter patient followed by palliative care and oncology. She wants to continue on intermittent thoracentesis and fu with onco and PMT as outpatient. Stable from respiratory stand point.  Subjective: Seen and examined today Overnight afebrile noted blood sugar in 400s last night level fluctuating was as low as 50s In am. Resting comfortably respiration and breathing at baseline Patient's son at the bedside She is thinking whether to go to rehab versus home She does not want to go to CIR and would like to get discharged home today.  Discharge diagnosis:   Acute on chronic hypoxic respiratory  failure Malignant pleural effusion: Lung cancer: Followed by pulmonary, oncology also aware, managed by thoracentesis 8/23, 8/27 with resultant reexpansion pulmonary edema leading to more shortness of breath.  Cytology 8/23 suspicious for malignant cells, this is likely malignant, would benefit from Pleurx-an patient is thinking about either do Pleurx versus intermittent thoracentesis, continue to follow-up with palliative care oncology pulmonary. Continue supplemental oxygen, bronchodilators. 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 Follows with Dr. Sherrod and currently on palliative systemic chemotherapy with carboplatin  and paclitaxel  as well as Keytruda .Palliative care input appreciated continue ongoing GOC   Hemoptysis: likely secondary to underlying malignancy.Managed with tranexamic  nebs-resolved if recurs, recommendation would be for consideration of IR evaluation for possible embolization  COPD/emphysema ILD Continue oxygen, takes 2 L at home, continue bronchodilators,.  Completed steroid course and antibiotics  Essential hypertension-BP stable  sinus tachycardia vs atrial tachycardia: Seen by cardiology they feel that her heart rate is improved with improvement in her pulm status. This is likely sinus tachycardia-but  she may have had a brief atrial tachycardia episode in the ED. Manage respiratory status.  Continue bisoprolol , Cardizem  Amlodipine  has been discontinued Zio patch being planned for at discharge   Metabolic acidosis s/p treatment   Hypokalemia Hypophosphatemia Hypomagnesemia Repleted  Normocytic Anemia  Hgb/Hct Trend relatively stable and last check was 9.2/32.2 w/ MCV of 88.5.Anemia Panel and showed iron level of 27, TIBC 194, TIBC 221, saturation of 12%, ferritin 1169, vitamin  B12 1832.  CTM for S/Sx of  Bleeding;No overt bleeding noted   Diabetes Mellitus Type 2 aA1c 9.3, elevated; at risk for hyperglycemia due to steroid.  Given sliding scale patient having fluctuation in blood sugar regimen, home meds glipizide  and metformin  resumed blood sugar fairly stable without- hypoglycemia Recent Labs  Lab 08/24/24 1609 08/24/24 1714 08/24/24 2220 08/25/24 0757 08/25/24 1129  GLUCAP 78 176* 416* 283* 260*    CAD HLD: No further mortality benefit with statin, discontinued   Anxiety Cont tarax 25 mg p.o. 3 times daily  GOC: DNR. At high risk for readmission. Sen by PMT She patient also seems to indicate that she does not want to have a life moving back and forth to the hospital.  Her palliative chemo she felt she tolerated okay but then felt pretty bad a week or 2 later.  We discussed that it is difficult to tell whether this is a manifestation of the chemotherapy versus just fluid accumulation and lung collapse coincidentally soon after her chemotherapy.  After a long time discussing options the patient would like to continue attempts at palliative chemotherapy for more time and repeated thoracentesis to keep pleural effusions under control.  She is clear that if she does not tolerate these treatments that she can change gears and make a different decision down the road.  I offered, and she accepted, referral to outpatient palliative care and a cancer center with [facility]   Mobility: PT Orders: Active PT Follow up Rec: Home Health Pt (Pt States She Is Currently On Caseload)08/24/2024 1600   DVT prophylaxis: SCDs Start: 08/16/24 2332 Code Status:   Code Status: Limited: Do not attempt resuscitation (DNR) -DNR-LIMITED -Do Not Intubate/DNI  Family Communication: plan of care discussed with patient/son at bedside. Patient status is: Remains hospitalized because of severity of illness Level of care: Progressive   Dispo: The patient is from: home.            Anticipated disposition: will dc  home.  Objective: Vitals last 24 hrs: Vitals:   08/25/24 0744 08/25/24 1248 08/25/24 1305 08/25/24 1331  BP:   (!) 117/56 (!) 145/66  Pulse:    89  Resp:    16  Temp:    98.9 F (37.2 C)  TempSrc:    Oral  SpO2: 97% 97%  98%  Weight:      Height:       Physical Examination: General exam: alert awake, oriented, older than stated age HEENT:Oral mucosa moist, Ear/Nose WNL grossly Respiratory system: Bilaterally diminished BS,no use of accessory muscle Cardiovascular system: S1 & S2 +, No JVD. Gastrointestinal system: Abdomen soft,NT,ND, BS+ Nervous System: Alert, awake, moving all extremities,and following commands. Extremities: LE edema neg, distal extremities warm.  Skin: No rashes,no icterus. MSK: Normal muscle bulk,tone, power    Consultation: See note.  Discharge Instructions  Discharge Instructions     Amb Referral to Palliative Care   Complete by: As directed    Ongoing GOC pending tolerance/QOL with palliative treatment; may need assist with symptom management too   Diet Carb Modified   Complete by: As directed    Discharge instructions   Complete by: As directed    Please call call MD or return to ER for similar or worsening recurring problem that brought you to hospital or if any fever,nausea/vomiting,abdominal pain, uncontrolled pain, chest pain,  shortness of breath or any other alarming symptoms.  Please follow-up your doctor as instructed in a week time and call the office  for appointment.  Please avoid alcohol, smoking, or any other illicit substance and maintain healthy habits including taking your regular medications as prescribed.  You were cared for by a hospitalist during your hospital stay. If you have any questions about your discharge medications or the care you received while you were in the hospital after you are discharged, you can call the unit and ask to speak with the hospitalist on call if the hospitalist that took care of you is not  available.  Once you are discharged, your primary care physician will handle any further medical issues. Please note that NO REFILLS for any discharge medications will be authorized once you are discharged, as it is imperative that you return to your primary care physician (or establish a relationship with a primary care physician if you do not have one) for your aftercare needs so that they can reassess your need for medications and monitor your lab values    Check blood sugar 3 times a day and bedtime at home. If blood sugar running above 200 less than 70 please call your MD to adjust insulin . If blood sugars running less 100 do not use insulin  and call MD. If you noticed signs and symptoms of hypoglycemia or low blood sugar like jitteriness, confusion, thirst, tremor, sweating- Check blood sugar, drink sugary drink/biscuits/sweets to increase sugar level and call MD or return to ER.   Discharge instructions   Complete by: As directed    Please call call MD or return to ER for similar or worsening recurring problem that brought you to hospital or if any fever,nausea/vomiting,abdominal pain, uncontrolled pain, chest pain,  shortness of breath or any other alarming symptoms.  Please follow-up your doctor as instructed in a week time and call the office for appointment.  Please avoid alcohol, smoking, or any other illicit substance and maintain healthy habits including taking your regular medications as prescribed.  You were cared for by a hospitalist during your hospital stay. If you have any questions about your discharge medications or the care you received while you were in the hospital after you are discharged, you can call the unit and ask to speak with the hospitalist on call if the hospitalist that took care of you is not available.  Once you are discharged, your primary care physician will handle any further medical issues. Please note that NO REFILLS for any discharge medications will be  authorized once you are discharged, as it is imperative that you return to your primary care physician (or establish a relationship with a primary care physician if you do not have one) for your aftercare needs so that they can reassess your need for medications and monitor your lab values   Increase activity slowly   Complete by: As directed    Increase activity slowly   Complete by: As directed    Increase activity slowly   Complete by: As directed       Allergies as of 08/25/2024       Reactions   Benzonatate Palpitations   Lisinopril Swelling, Other (See Comments)   Patient ended up in the ED with a swollen mouth and tongue   Aspirin  Nausea And Vomiting, Palpitations   Can tolerate baby aspirin  81 mg without difficulty but can't tolerate higher dosages   Azithromycin  Palpitations   Codeine  Palpitations   Patient states that this is not an allergy         Medication List     STOP taking these medications  amLODipine  5 MG tablet Commonly known as: NORVASC    dexamethasone  4 MG tablet Commonly known as: DECADRON    doxycycline  100 MG tablet Commonly known as: VIBRA -TABS   levofloxacin  500 MG tablet Commonly known as: LEVAQUIN    omeprazole  20 MG capsule Commonly known as: PRILOSEC   potassium chloride  SA 20 MEQ tablet Commonly known as: KLOR-CON  M   pravastatin  80 MG tablet Commonly known as: PRAVACHOL    predniSONE  20 MG tablet Commonly known as: DELTASONE        TAKE these medications    albuterol  108 (90 Base) MCG/ACT inhaler Commonly known as: VENTOLIN  HFA Inhale 2 puffs into the lungs every 6 (six) hours as needed for wheezing or shortness of breath.   aspirin  EC 81 MG tablet Take 81 mg by mouth daily. Swallow whole.   bisoprolol  10 MG tablet Commonly known as: ZEBETA  Take 1 tablet (10 mg total) by mouth daily. What changed:  medication strength how much to take when to take this   cetirizine  10 MG tablet Commonly known as: ZyrTEC   Allergy  Take 1 tablet (10 mg total) by mouth daily.   diclofenac Sodium 1 % Gel Commonly known as: VOLTAREN Apply 1 Application topically daily as needed (pain).   diltiazem  360 MG 24 hr capsule Commonly known as: Cardizem  CD Take 1 capsule (360 mg total) by mouth daily.   empagliflozin  25 MG Tabs tablet Commonly known as: JARDIANCE  Take 25 mg by mouth daily at 6 PM.   ferrous sulfate  325 (65 FE) MG tablet Take 325 mg by mouth daily with breakfast.   glipiZIDE  5 MG 24 hr tablet Commonly known as: GLUCOTROL  XL Take 1 tablet (5 mg total) by mouth daily with breakfast.   guaiFENesin -codeine  100-10 MG/5ML syrup Take 5 mLs by mouth 3 (three) times daily as needed for cough.   ipratropium-albuterol  0.5-2.5 (3) MG/3ML Soln Commonly known as: DUONEB Take 3 mLs by nebulization in the morning and at bedtime.   lidocaine -prilocaine  cream Commonly known as: EMLA  Apply to affected area once   metFORMIN  500 MG 24 hr tablet Commonly known as: GLUCOPHAGE -XR Take 1,000 mg by mouth in the morning and at bedtime.   NovoLOG  FlexPen 100 UNIT/ML FlexPen Generic drug: insulin  aspart Inject 0-11 Units into the skin 3 (three) times daily with meals. Check Blood Glucose (BG) and inject per scale: BG <150= 0 unit; BG 150-200= 1 unit; BG 201-250= 3 unit; BG 251-300= 5 unit; BG 301-350= 7 unit; BG 351-400= 9 unit; BG >400= 11 unit and Call Primary Care. What changed:  when to take this reasons to take this   ondansetron  8 MG tablet Commonly known as: Zofran  Take 1 tablet (8 mg total) by mouth every 8 (eight) hours as needed for nausea or vomiting. Start on the third day after carboplatin .   PEG 3350  17 g Pack Take 17 g packet by mouth daily.   prochlorperazine  10 MG tablet Commonly known as: COMPAZINE  Take 1 tablet (10 mg total) by mouth every 6 (six) hours as needed.   umeclidinium-vilanterol 62.5-25 MCG/ACT Aepb Commonly known as: Anoro Ellipta  Inhale 1 puff into the lungs daily.                Durable Medical Equipment  (From admission, onward)           Start     Ordered   08/24/24 0858  For home use only DME oxygen  Once       Question Answer Comment  Length of Need Lifetime  Mode or (Route) Nasal cannula   Liters per Minute 4   Frequency Continuous (stationary and portable oxygen unit needed)   Oxygen conserving device Yes   Oxygen delivery system Gas      08/24/24 0857            Follow-up Information     Croitoru, Mihai, MD. Schedule an appointment as soon as possible for a visit in 1 month(s).   Specialty: Cardiology Contact information: [address] [place] [address] [phone]         [name], MD Follow up in 1 week(s).   Specialty: Family Medicine Contact information: [address] [place] [address] [phone]                Allergies  Allergen Reactions   Benzonatate Palpitations   Lisinopril Swelling and Other (See Comments)    Patient ended up in the ED with a swollen mouth and tongue   Aspirin  Nausea And Vomiting and Palpitations    Can tolerate baby aspirin  81 mg without difficulty but can't tolerate higher dosages   Azithromycin  Palpitations   Codeine  Palpitations    Patient states that this is not an allergy     The results of significant diagnostics from this hospitalization (including imaging, microbiology, ancillary and laboratory) are listed below for reference.    Microbiology: Recent Results (from the past 240 hours)  Resp panel by RT-PCR (RSV, Flu A&B, Covid) Anterior Nasal Swab     Status: None   Collection Time: 08/16/24  9:24 PM   Specimen: Anterior Nasal Swab  Result Value Ref Range Status   SARS Coronavirus 2 by RT PCR NEGATIVE NEGATIVE Final    Comment: (NOTE) SARS-CoV-2 target nucleic acids are NOT DETECTED.  The SARS-CoV-2 RNA is generally detectable in upper respiratory specimens during the acute phase of infection. The  lowest concentration of SARS-CoV-2 viral copies this assay can detect is 138 copies/mL. A negative result does not preclude SARS-Cov-2 infection and should not be used as the sole basis for treatment or other patient management decisions. A negative result may occur with  improper specimen collection/handling, submission of specimen other than nasopharyngeal swab, presence of viral mutation(s) within the areas targeted by this assay, and inadequate number of viral copies(<138 copies/mL). A negative result must be combined with clinical observations, patient history, and epidemiological information. The expected result is Negative.  Fact Sheet for Patients:  BloggerCourse.com  Fact Sheet for Healthcare Providers:  SeriousBroker.it  This test is no t yet approved or cleared by the [place]  FDA and  has been authorized for detection and/or diagnosis of SARS-CoV-2 by FDA under an Emergency Use Authorization (EUA). This EUA will remain  in effect (meaning this test can be used) for the duration of the COVID-19 declaration under Section 564(b)(1) of the Act, 21 U.S.C.section 360bbb-3(b)(1), unless the authorization is terminated  or revoked sooner.       Influenza A by PCR NEGATIVE NEGATIVE Final   Influenza B by PCR NEGATIVE NEGATIVE Final    Comment: (NOTE) The Xpert Xpress SARS-CoV-2/FLU/RSV plus assay is intended as an aid in the diagnosis of influenza from Nasopharyngeal swab specimens and should not be used as a sole basis for treatment. Nasal washings and aspirates are unacceptable for Xpert Xpress SARS-CoV-2/FLU/RSV testing.  Fact Sheet for Patients: BloggerCourse.com  Fact Sheet for Healthcare Providers: SeriousBroker.it  This test is not yet approved or cleared by the [place]  FDA and has been authorized  for detection and/or diagnosis of SARS-CoV-2 by FDA under  an Emergency Use Authorization (EUA). This EUA will remain in effect (meaning this test can be used) for the duration of the COVID-19 declaration under Section 564(b)(1) of the Act, 21 U.S.C. section 360bbb-3(b)(1), unless the authorization is terminated or revoked.     Resp Syncytial Virus by PCR NEGATIVE NEGATIVE Final    Comment: (NOTE) Fact Sheet for Patients: BloggerCourse.com  Fact Sheet for Healthcare Providers: SeriousBroker.it  This test is not yet approved or cleared by the [place]  FDA and has been authorized for detection and/or diagnosis of SARS-CoV-2 by FDA under an Emergency Use Authorization (EUA). This EUA will remain in effect (meaning this test can be used) for the duration of the COVID-19 declaration under Section 564(b)(1) of the Act, 21 U.S.C. section 360bbb-3(b)(1), unless the authorization is terminated or revoked.  Performed at [facility], [address] [place], [place] [address]   MRSA Next Gen by PCR, Nasal     Status: None   Collection Time: 08/17/24  2:05 PM   Specimen: Nasal Mucosa; Nasal Swab  Result Value Ref Range Status   MRSA by PCR Next Gen NOT DETECTED NOT DETECTED Final    Comment: (NOTE) The GeneXpert MRSA Assay (FDA approved for NASAL specimens only), is one component of a comprehensive MRSA colonization surveillance program. It is not intended to diagnose MRSA infection nor to guide or monitor treatment for MRSA infections. Test performance is not FDA approved in patients less than 49 years old. Performed at [facility], [address], [place], [place] [address]   Body fluid culture w Gram Stain     Status: None   Collection Time: 08/18/24 11:13 AM   Specimen: Pleural Fluid  Result Value Ref Range Status   Specimen Description   Final    PLEURAL Performed at [facility], [address], [place], [place] [address]     Special Requests   Final    NONE Performed at [facility], [address], [place], [place] [address]    Gram Stain   Final    WBC PRESENT,BOTH PMN AND MONONUCLEAR RED BLOOD CELLS PRESENT NO ORGANISMS SEEN    Culture   Final    NO GROWTH 3 DAYS Performed at [facility], [address], [place], [place] [address]    Report Status 08/21/2024 FINAL  Final    Procedures/Studies: DG CHEST PORT 1 VIEW Result Date: 08/22/2024 CLINICAL DATA:  Respiratory failure EXAM: PORTABLE CHEST 1 VIEW COMPARISON:  08/21/2024 FINDINGS: Large right pleural effusion again noted. Continued right lower lobe airspace disease. Improving right upper lobe airspace disease. Patchy airspace disease throughout the left lung, stable. Heart likely mildly enlarged. Aortic atherosclerosis. IMPRESSION: Large right pleural effusion with some improvement in the right upper lobe consolidation. Otherwise no change. Electronically Signed   By: Franky Crease M.D.   On: 08/22/2024 14:28   DG Chest Port 1 View Result Date: 08/21/2024 CLINICAL DATA:  Status post right thoracentesis yielding 750 cc, known lung cancer EXAM: PORTABLE CHEST 1 VIEW COMPARISON:  08/20/2024, 08/17/2019 FINDINGS: Minimal improvement in the right effusion following thoracentesis. Residual right effusion remains. Similar diffuse mixed interstitial and airspace opacities with right lower lung collapse/consolidation. No significant left effusion. No pneumothorax. Known right lower lobe central obstructing mass is obscured and better demonstrated by comparison PET-CT. No acute osseous finding. IMPRESSION: 1. Minimal improvement in the right effusion following thoracentesis. No  pneumothorax. 2. Similar diffuse mixed interstitial and airspace opacities with right lower lung collapse/consolidation. Electronically Signed   By: CHRISTELLA.  Shick M.D.   On: 08/21/2024 17:28   IR THORACENTESIS ASP PLEURAL SPACE W/IMG GUIDE Result Date:  08/21/2024 INDICATION: Patient with history of lung cancer, dyspnea, recurrent right pleural effusion. Request received for therapeutic right thoracentesis. EXAM: ULTRASOUND GUIDED THERAPEUTIC RIGHT THORACENTESIS MEDICATIONS: 8 mL 1% lidocaine  with epinephrine  COMPLICATIONS: None immediate. PROCEDURE: An ultrasound guided thoracentesis was thoroughly discussed with the patient and questions answered. The benefits, risks, alternatives and complications were also discussed. The patient understands and wishes to proceed with the procedure. Written consent was obtained. Ultrasound was performed to localize and mark an adequate pocket of fluid in the right chest. The area was then prepped and draped in the normal sterile fashion. 1% Lidocaine  was used for local anesthesia. Under ultrasound guidance a 6 Fr Safe-T-Centesis catheter was introduced. Thoracentesis was performed. The catheter was removed and a dressing applied. FINDINGS: A total of approximately 750 cc of slightly hazy, yellow fluid was removed. IMPRESSION: Successful ultrasound guided therapeutic right thoracentesis yielding 750 cc of pleural fluid. Performed by: Franky Rakers, PA-C Electronically Signed   By: CHRISTELLA.  Shick M.D.   On: 08/21/2024 16:21   DG CHEST PORT 1 VIEW Result Date: 08/20/2024 CLINICAL DATA:  Follow-up right pleural effusion. EXAM: PORTABLE CHEST 1 VIEW COMPARISON:  08/19/2024 and chest CTA dated 08/16/2024. FINDINGS: The heart size can not be assessed due to obscuration of the right heart border. No significant change in confluence and patchy increased density in the right mid and lower lung zones. No significant change in chronic bilateral interstitial lung disease and right lung bullous changes. Stable laterally tracking pleural fluid on the right. Tortuous and partially calcified thoracic aorta. Unremarkable bones. IMPRESSION: 1. No significant change in right mid and lower lung zone pneumonia/atelectasis and right pleural effusion. 2.  Stable chronic bilateral interstitial lung disease and right lung bullous changes. Electronically Signed   By: Elspeth Bathe M.D.   On: 08/20/2024 10:49   DG CHEST PORT 1 VIEW Result Date: 08/19/2024 CLINICAL DATA:  Shortness of breath. EXAM: PORTABLE CHEST 1 VIEW COMPARISON:  08/18/2024. FINDINGS: The cardio pericardial silhouette is enlarged. Right base collapse/consolidation with moderate right effusion is similar. Background diffuse interstitial opacity is similar. No left pleural effusion. Bones are diffusely demineralized. Telemetry leads overlie the chest. IMPRESSION: No substantial interval change. Right base collapse/consolidation with moderate right effusion. Electronically Signed   By: Camellia Candle M.D.   On: 08/19/2024 09:17   DG CHEST PORT 1 VIEW Result Date: 08/18/2024 CLINICAL DATA:  Pleural effusion.  Lung carcinoma.  Thoracentesis. EXAM: PORTABLE CHEST 1 VIEW COMPARISON:  05/18/2024 FINDINGS: Moderate right pleural effusion shows increase in size since prior exam. Increased right lower lung atelectasis. No pneumothorax visualized. Chronic interstitial lung disease again noted. Stable heart size. IMPRESSION: Increased size of moderate right pleural effusion and right lower lung atelectasis. No pneumothorax visualized. Chronic interstitial lung disease. Electronically Signed   By: Norleen DELENA Kil M.D.   On: 08/18/2024 11:57   CT Angio Chest PE W and/or Wo Contrast Result Date: 08/16/2024 CLINICAL DATA:  Shortness of breath, hemoptysis. History of lung cancer. EXAM: CT ANGIOGRAPHY CHEST WITH CONTRAST TECHNIQUE: Multidetector CT imaging of the chest was performed using the standard protocol during bolus administration of intravenous contrast. Multiplanar CT image reconstructions and MIPs were obtained to evaluate the vascular anatomy. RADIATION DOSE REDUCTION: This exam was performed according to the departmental  dose-optimization program which includes automated exposure control, adjustment of  the mA and/or kV according to patient size and/or use of iterative reconstruction technique. CONTRAST:  75mL OMNIPAQUE  IOHEXOL  350 MG/ML SOLN COMPARISON:  July 16, 2024.  June 28, 2024. FINDINGS: Cardiovascular: Satisfactory opacification of the pulmonary arteries to the segmental level. No evidence of pulmonary embolism. Mild cardiomegaly. No pericardial effusion. Mediastinum/Nodes: Thyroid  gland is unremarkable. The esophagus is unremarkable. Extensive adenopathy is noted in the subcarinal and right hilar regions as noted on recent PET scan. This mass appears to extend into and occlude the right lower lobe bronchus proximally. Lungs/Pleura: No pneumothorax is noted. Emphysematous disease is noted. 3.7 cm mass noted posteriorly in left lower lobe consistent with metastatic disease. Moderate size right pleural effusion is noted with adjacent atelectasis or infiltrate of residual right upper lobe. Upper Abdomen: No acute abnormality. Musculoskeletal: No chest wall abnormality. No acute or significant osseous findings. Review of the MIP images confirms the above findings. IMPRESSION: 1. No definite evidence of pulmonary embolus. 2. Extensive subcarinal and right hilar adenopathy is noted as noted on recent PET scan. This mass appears to extend into and occlude the right lower lobe bronchus proximally. 3. Moderate size right pleural effusion is noted with adjacent atelectasis or infiltrate of residual right upper lobe. 4. 3.7 cm mass noted posteriorly in left lower lobe consistent with metastatic disease. 5. Emphysema. Aortic Atherosclerosis (ICD10-I70.0) and Emphysema (ICD10-J43.9). Electronically Signed   By: Lynwood Landy Raddle M.D.   On: 08/16/2024 17:30   Labs: BNP (last 3 results) Recent Labs    04/06/24 1807 04/10/24 0411  BNP 141.6* 90.9   Basic Metabolic Panel: Recent Labs  Lab 08/19/24 0301 08/20/24 0754 08/21/24 0503 08/22/24 0322 08/24/24 2245  NA 140 138 138 137  --   K 3.2* 4.0 3.7 3.7  --    CL 105 105 104 100  --   CO2 22 24 25 24   --   GLUCOSE 192* 147* 139* 157* 480*  BUN 11 16 15 15   --   CREATININE 0.47 0.60 0.55 0.69  --   CALCIUM  8.8* 8.7* 9.0 9.1  --   MG 1.6* 1.9  --   --   --   PHOS 1.9* 3.2  --   --   --    Liver Function Tests: Recent Labs  Lab 08/19/24 0301 08/20/24 0754 08/21/24 0503  AST 12* 27 11*  ALT 6 9 7   ALKPHOS 67 61 62  BILITOT 0.3 0.5 0.3  PROT 6.6 6.4* 6.4*  ALBUMIN 2.6* 2.4* 2.4*   No results for input(s): LIPASE, AMYLASE in the last 168 hours. No results for input(s): AMMONIA in the last 168 hours. CBC: Recent Labs  Lab 08/19/24 0301 08/20/24 0754 08/21/24 0503 08/22/24 0322  WBC 11.3* 9.5 10.9* 10.2  NEUTROABS 8.8* 7.0  --   --   HGB 9.2* 8.3* 9.0* 9.3*  HCT 32.2* 29.1* 30.8* 31.4*  MCV 88.5 87.1 86.3 86.3  PLT 560* 498* 562* 533*   CBG: Recent Labs  Lab 08/24/24 1609 08/24/24 1714 08/24/24 2220 08/25/24 0757 08/25/24 1129  GLUCAP 78 176* 416* 283* 260*   Recent Labs  Lab 08/19/24 0301 08/20/24 0754 08/21/24 0503 08/22/24 0322  WBC 11.3* 9.5 10.9* 10.2   Microbiology Recent Results (from the past 240 hours)  Resp panel by RT-PCR (RSV, Flu A&B, Covid) Anterior Nasal Swab     Status: None   Collection Time: 08/16/24  9:24 PM   Specimen: Anterior  Nasal Swab  Result Value Ref Range Status   SARS Coronavirus 2 by RT PCR NEGATIVE NEGATIVE Final    Comment: (NOTE) SARS-CoV-2 target nucleic acids are NOT DETECTED.  The SARS-CoV-2 RNA is generally detectable in upper respiratory specimens during the acute phase of infection. The lowest concentration of SARS-CoV-2 viral copies this assay can detect is 138 copies/mL. A negative result does not preclude SARS-Cov-2 infection and should not be used as the sole basis for treatment or other patient management decisions. A negative result may occur with  improper specimen collection/handling, submission of specimen other than nasopharyngeal swab, presence of  viral mutation(s) within the areas targeted by this assay, and inadequate number of viral copies(<138 copies/mL). A negative result must be combined with clinical observations, patient history, and epidemiological information. The expected result is Negative.  Fact Sheet for Patients:  BloggerCourse.com  Fact Sheet for Healthcare Providers:  SeriousBroker.it  This test is no t yet approved or cleared by the [place]  FDA and  has been authorized for detection and/or diagnosis of SARS-CoV-2 by FDA under an Emergency Use Authorization (EUA). This EUA will remain  in effect (meaning this test can be used) for the duration of the COVID-19 declaration under Section 564(b)(1) of the Act, 21 U.S.C.section 360bbb-3(b)(1), unless the authorization is terminated  or revoked sooner.       Influenza A by PCR NEGATIVE NEGATIVE Final   Influenza B by PCR NEGATIVE NEGATIVE Final    Comment: (NOTE) The Xpert Xpress SARS-CoV-2/FLU/RSV plus assay is intended as an aid in the diagnosis of influenza from Nasopharyngeal swab specimens and should not be used as a sole basis for treatment. Nasal washings and aspirates are unacceptable for Xpert Xpress SARS-CoV-2/FLU/RSV testing.  Fact Sheet for Patients: BloggerCourse.com  Fact Sheet for Healthcare Providers: SeriousBroker.it  This test is not yet approved or cleared by the [place]  FDA and has been authorized for detection and/or diagnosis of SARS-CoV-2 by FDA under an Emergency Use Authorization (EUA). This EUA will remain in effect (meaning this test can be used) for the duration of the COVID-19 declaration under Section 564(b)(1) of the Act, 21 U.S.C. section 360bbb-3(b)(1), unless the authorization is terminated or revoked.     Resp Syncytial Virus by PCR NEGATIVE NEGATIVE Final    Comment: (NOTE) Fact Sheet for  Patients: BloggerCourse.com  Fact Sheet for Healthcare Providers: SeriousBroker.it  This test is not yet approved or cleared by the [place]  FDA and has been authorized for detection and/or diagnosis of SARS-CoV-2 by FDA under an Emergency Use Authorization (EUA). This EUA will remain in effect (meaning this test can be used) for the duration of the COVID-19 declaration under Section 564(b)(1) of the Act, 21 U.S.C. section 360bbb-3(b)(1), unless the authorization is terminated or revoked.  Performed at [facility], [address], [place], [place] [address]   MRSA Next Gen by PCR, Nasal     Status: None   Collection Time: 08/17/24  2:05 PM   Specimen: Nasal Mucosa; Nasal Swab  Result Value Ref Range Status   MRSA by PCR Next Gen NOT DETECTED NOT DETECTED Final    Comment: (NOTE) The GeneXpert MRSA Assay (FDA approved for NASAL specimens only), is one component of a comprehensive MRSA colonization surveillance program. It is not intended to diagnose MRSA infection nor to guide or monitor treatment for MRSA infections. Test performance is not FDA approved in patients less than 11 years old. Performed at [facility], [address], [place], [place] [address]   Body fluid culture w Gram Stain     Status: None   Collection Time: 08/18/24 11:13 AM   Specimen: Pleural Fluid  Result Value Ref Range Status   Specimen Description   Final    PLEURAL Performed at [facility], [address] [place], [place] [address]    Special Requests   Final    NONE Performed at [facility], [address] [place], [place] [address]    Gram Stain   Final    WBC PRESENT,BOTH PMN AND MONONUCLEAR RED BLOOD CELLS PRESENT NO ORGANISMS SEEN    Culture   Final    NO GROWTH 3 DAYS Performed at [facility], [address], [place], [place] [address]     Report Status 08/21/2024 FINAL  Final   Time coordinating discharge: 35 minutes SIGNED: Mennie LAMY, MD  [facility] 08/25/2024, 2:30 PM  If 7PM-7AM, please contact night-coverage [email or web address]

## 2024-08-25 NOTE — Plan of Care (Signed)
  Problem: Education: Goal: Ability to describe self-care measures that may prevent or decrease complications (Diabetes Survival Skills Education) will improve Outcome: Progressing   Problem: Clinical Measurements: Goal: Diagnostic test results will improve Outcome: Progressing   Problem: Clinical Measurements: Goal: Diagnostic test results will improve Outcome: Progressing   Problem: Coping: Goal: Level of anxiety will decrease Outcome: Progressing

## 2024-08-27 NOTE — Progress Notes (Unsigned)
 [facility] OFFICE PROGRESS NOTE  Lindsey Luke POUR, MD [address] [place] [address]  DIAGNOSIS:  1) history of stage Ia non-small cell lung cancer, adenocarcinoma the right upper lobe who under went radiation under the care of Dr. Patrcia from 04/23/2022-04/29/22 2) Recurrent lung cancer, initially diagnosed as Stage IIIa confirm (T3, N2, M0) non-small cell lung cancer, squamous cell carcinoma. She presented with recurrent tumor at the right lung base, associated right lower lobe metastasis, and right hilar and subcarinal nodal metastases. This was diagnosed in October 2024.   PDL1: 85%   PRIOR THERAPY: 1) Radiation to the right upper lobe under the care of Dr. Patrcia from 04/23/2022-04/29/22  2) Concurrent chemoradiation with carboplatin  for an AUC of 2 and paclitaxel  45 mg/m.  Last dose on 12/20/23 Status post 7 cycle.  3) Consolidation immunotherapy with Imfinzi  1500 mg IV every 4 weeks, first dose expected on 02/07/2024. Discontinued after 1 cycle due to concern for pneumonitis  CURRENT THERAPY:  Palliative systemic chemotherapy with carboplatin  for an AUC of 5, paclitaxel  175 mg/m, Keytruda  200 mg IV every 3 weeks first dose on 07/23/2024.  She will also receive Neulasta  support.   INTERVAL HISTORY: Lindsey Williamson 73 y.o. female returns to the clinic today for a follow up visit. The patient was last seen in the clinic on 08/13/24. She was supposed to undergo cycle #2 but was not feeling well. Therefore she was given a course of antibiotics. She presented to the ER a few days later on 08/16/24-08/24/24 for worsening hemoptysis and dyspnea. She underwent thoracentesis which was positive for malignant cells. Pulmonary discussed potential pleurX catheter but the patient wanted to consider her options. She underwent intermittent lasix . She underwent nebulizer. If she has recurrent hemoptysis, then she can be considered for IR  embolization. She was treated with  antibiotics with rocephin . Steroids caused hyperglycemia previously so she did not receive steroids. She also had tachycardia and zio patch was being planned at discharge.  Since being discharged, she is feeling ***.   She denies fever, chills, or night sweats. Her appetite is ***. Her breathing is ***. She is on *** L of oxygen. She denies chest pain or hemoptysis. She denies nausea, vomiting, diarrhea, and constipation. She denies rashes or skin changes. She denies headaches or vision changes. She is scheduled for cycle #2 on ***.     MEDICAL HISTORY: Past Medical History:  Diagnosis Date   Angioedema 10/17/2023   Arthritis    Diabetes mellitus without complication (HCC)    Hypertension     ALLERGIES:  is allergic to benzonatate, lisinopril, aspirin , azithromycin , and codeine .  MEDICATIONS:  Current Outpatient Medications  Medication Sig Dispense Refill   albuterol  (VENTOLIN  HFA) 108 (90 Base) MCG/ACT inhaler Inhale 2 puffs into the lungs every 6 (six) hours as needed for wheezing or shortness of breath. 20.1 g 2   aspirin  EC 81 MG tablet Take 81 mg by mouth daily. Swallow whole.     bisoprolol  (ZEBETA ) 10 MG tablet Take 1 tablet (10 mg total) by mouth daily. 90 tablet 3   cetirizine  (ZYRTEC  ALLERGY ) 10 MG tablet Take 1 tablet (10 mg total) by mouth daily. 90 tablet 1   diclofenac Sodium (VOLTAREN) 1 % GEL Apply 1 Application topically daily as needed (pain).     diltiazem  (CARDIZEM  CD) 360 MG 24 hr capsule Take 1 capsule (360 mg total) by mouth daily. 90 capsule 3   empagliflozin  (JARDIANCE ) 25 MG  TABS tablet Take 25 mg by mouth daily at 6 PM.     ferrous sulfate  325 (65 FE) MG tablet Take 325 mg by mouth daily with breakfast.     glipiZIDE  (GLUCOTROL  XL) 5 MG 24 hr tablet Take 1 tablet (5 mg total) by mouth daily with breakfast. 30 tablet 0   guaiFENesin -codeine  100-10 MG/5ML syrup Take 5 mLs by mouth 3 (three) times daily as needed for cough. 120 mL 0   insulin  aspart (NOVOLOG )  100 UNIT/ML FlexPen Inject 0-11 Units into the skin 3 (three) times daily with meals. Check Blood Glucose (BG) and inject per scale: BG <150= 0 unit; BG 150-200= 1 unit; BG 201-250= 3 unit; BG 251-300= 5 unit; BG 301-350= 7 unit; BG 351-400= 9 unit; BG >400= 11 unit and Call Primary Care. (Patient taking differently: Inject 0-11 Units into the skin as needed for high blood sugar. Check Blood Glucose (BG) and inject per scale: BG <150= 0 unit; BG 150-200= 1 unit; BG 201-250= 3 unit; BG 251-300= 5 unit; BG 301-350= 7 unit; BG 351-400= 9 unit; BG >400= 11 unit and Call Primary Care.) 15 mL 0   ipratropium-albuterol  (DUONEB) 0.5-2.5 (3) MG/3ML SOLN Take 3 mLs by nebulization in the morning and at bedtime. 360 mL 2   lidocaine -prilocaine  (EMLA ) cream Apply to affected area once 30 g 3   metFORMIN  (GLUCOPHAGE -XR) 500 MG 24 hr tablet Take 1,000 mg by mouth in the morning and at bedtime.     ondansetron  (ZOFRAN ) 8 MG tablet Take 1 tablet (8 mg total) by mouth every 8 (eight) hours as needed for nausea or vomiting. Start on the third day after carboplatin . 30 tablet 1   polyethylene glycol (MIRALAX  / GLYCOLAX ) 17 g packet Take 17 g packet by mouth daily. 14 each 0   prochlorperazine  (COMPAZINE ) 10 MG tablet Take 1 tablet (10 mg total) by mouth every 6 (six) hours as needed. 30 tablet 2   umeclidinium-vilanterol (ANORO ELLIPTA ) 62.5-25 MCG/ACT AEPB Inhale 1 puff into the lungs daily. 14 each 6   No current facility-administered medications for this visit.    SURGICAL HISTORY:  Past Surgical History:  Procedure Laterality Date   BRONCHIAL BIOPSY  03/30/2022   Procedure: BRONCHIAL BIOPSIES;  Surgeon: Brenna Adine CROME, DO;  Location: [facility] ENDOSCOPY;  Service: Pulmonary;;   BRONCHIAL BIOPSY  10/17/2023   Procedure: BRONCHIAL BIOPSIES;  Surgeon: Shelah Lamar RAMAN, MD;  Location: [facility] ENDOSCOPY;  Service: Pulmonary;;   BRONCHIAL BRUSHINGS  03/30/2022   Procedure: BRONCHIAL BRUSHINGS;  Surgeon: Brenna Adine CROME, DO;   Location: [facility] ENDOSCOPY;  Service: Pulmonary;;   BRONCHIAL BRUSHINGS  10/17/2023   Procedure: BRONCHIAL BRUSHINGS;  Surgeon: Shelah Lamar RAMAN, MD;  Location: [facility] ENDOSCOPY;  Service: Pulmonary;;   BRONCHIAL NEEDLE ASPIRATION BIOPSY  03/30/2022   Procedure: BRONCHIAL NEEDLE ASPIRATION BIOPSIES;  Surgeon: Brenna Adine CROME, DO;  Location: [facility] ENDOSCOPY;  Service: Pulmonary;;   BRONCHIAL NEEDLE ASPIRATION BIOPSY  10/17/2023   Procedure: BRONCHIAL NEEDLE ASPIRATION BIOPSIES;  Surgeon: Shelah Lamar RAMAN, MD;  Location: [facility] ENDOSCOPY;  Service: Pulmonary;;   BRONCHIAL WASHINGS  05/19/2024   Procedure: IRRIGATION, BRONCHUS;  Surgeon: Jude Harden GAILS, MD;  Location: [facility] ENDOSCOPY;  Service: Cardiopulmonary;;   EYE SURGERY     FIDUCIAL MARKER PLACEMENT  03/30/2022   Procedure: FIDUCIAL MARKER PLACEMENT;  Surgeon: Brenna Adine CROME, DO;  Location: [facility] ENDOSCOPY;  Service: Pulmonary;;   FINE NEEDLE ASPIRATION  10/17/2023   Procedure: FINE NEEDLE ASPIRATION;  Surgeon: Shelah Lamar RAMAN,  MD;  Location: [facility] ENDOSCOPY;  Service: Pulmonary;;   FOREIGN BODY REMOVAL  03/30/2022   Procedure: FOREIGN BODY REMOVAL;  Surgeon: Brenna Adine CROME, DO;  Location: [facility] ENDOSCOPY;  Service: Pulmonary;;   HEMOSTASIS CONTROL  10/17/2023   Procedure: HEMOSTASIS CONTROL;  Surgeon: Shelah Lamar RAMAN, MD;  Location: [facility] ENDOSCOPY;  Service: Pulmonary;;   IR THORACENTESIS ASP PLEURAL SPACE W/IMG GUIDE  08/21/2024   VIDEO BRONCHOSCOPY Bilateral 05/19/2024   Procedure: VIDEO BRONCHOSCOPY WITHOUT FLUORO;  Surgeon: Jude Harden GAILS, MD;  Location: [facility] ENDOSCOPY;  Service: Cardiopulmonary;  Laterality: Bilateral;   VIDEO BRONCHOSCOPY WITH ENDOBRONCHIAL ULTRASOUND Bilateral 03/30/2022   Procedure: VIDEO BRONCHOSCOPY WITH ENDOBRONCHIAL ULTRASOUND;  Surgeon: Brenna Adine CROME, DO;  Location: [facility] ENDOSCOPY;  Service: Pulmonary;  Laterality: Bilateral;   VIDEO BRONCHOSCOPY WITH ENDOBRONCHIAL ULTRASOUND Right 10/17/2023   Procedure: VIDEO BRONCHOSCOPY WITH ENDOBRONCHIAL  ULTRASOUND;  Surgeon: Shelah Lamar RAMAN, MD;  Location: [facility] ENDOSCOPY;  Service: Pulmonary;  Laterality: Right;   VIDEO BRONCHOSCOPY WITH RADIAL ENDOBRONCHIAL ULTRASOUND  03/30/2022   Procedure: VIDEO BRONCHOSCOPY WITH RADIAL ENDOBRONCHIAL ULTRASOUND;  Surgeon: Brenna Adine CROME, DO;  Location: [facility] ENDOSCOPY;  Service: Pulmonary;;    REVIEW OF SYSTEMS:   Review of Systems  Constitutional: Negative for appetite change, chills, fatigue, fever and unexpected weight change.  HENT:   Negative for mouth sores, nosebleeds, sore throat and trouble swallowing.   Eyes: Negative for eye problems and icterus.  Respiratory: Negative for cough, hemoptysis, shortness of breath and wheezing.   Cardiovascular: Negative for chest pain and leg swelling.  Gastrointestinal: Negative for abdominal pain, constipation, diarrhea, nausea and vomiting.  Genitourinary: Negative for bladder incontinence, difficulty urinating, dysuria, frequency and hematuria.   Musculoskeletal: Negative for back pain, gait problem, neck pain and neck stiffness.  Skin: Negative for itching and rash.  Neurological: Negative for dizziness, extremity weakness, gait problem, headaches, light-headedness and seizures.  Hematological: Negative for adenopathy. Does not bruise/bleed easily.  Psychiatric/Behavioral: Negative for confusion, depression and sleep disturbance. The patient is not nervous/anxious.     PHYSICAL EXAMINATION:  There were no vitals taken for this visit.  ECOG PERFORMANCE STATUS: {CHL ONC ECOG [id number]  Physical Exam  Constitutional: Oriented to person, place, and time and well-developed, well-nourished, and in no distress. No distress.  HENT:  Head: Normocephalic and atraumatic.  Mouth/Throat: Oropharynx is clear and moist. No oropharyngeal exudate.  Eyes: Conjunctivae are normal. Right eye exhibits no discharge. Left eye exhibits no discharge. No scleral icterus.  Neck: Normal range of motion. Neck supple.   Cardiovascular: Normal rate, regular rhythm, normal heart sounds and intact distal pulses.   Pulmonary/Chest: Effort normal and breath sounds normal. No respiratory distress. No wheezes. No rales.  Abdominal: Soft. Bowel sounds are normal. Exhibits no distension and no mass. There is no tenderness.  Musculoskeletal: Normal range of motion. Exhibits no edema.  Lymphadenopathy:    No cervical adenopathy.  Neurological: Alert and oriented to person, place, and time. Exhibits normal muscle tone. Gait normal. Coordination normal.  Skin: Skin is warm and dry. No rash noted. Not diaphoretic. No erythema. No pallor.  Psychiatric: Mood, memory and judgment normal.  Vitals reviewed.  LABORATORY DATA: Lab Results  Component Value Date   WBC 10.2 08/22/2024   HGB 9.3 (L) 08/22/2024   HCT 31.4 (L) 08/22/2024   MCV 86.3 08/22/2024   PLT 533 (H) 08/22/2024      Chemistry      Component Value Date/Time   NA 137 08/22/2024 0322   K 3.7  08/22/2024 0322   CL 100 08/22/2024 0322   CO2 24 08/22/2024 0322   BUN 15 08/22/2024 0322   CREATININE 0.69 08/22/2024 0322   CREATININE 0.59 08/13/2024 0843      Component Value Date/Time   CALCIUM  9.1 08/22/2024 0322   ALKPHOS 62 08/21/2024 0503   AST 11 (L) 08/21/2024 0503   AST 11 (L) 08/13/2024 0843   ALT 7 08/21/2024 0503   ALT <5 08/13/2024 0843   BILITOT 0.3 08/21/2024 0503   BILITOT 0.3 08/13/2024 0843       RADIOGRAPHIC STUDIES:  DG CHEST PORT 1 VIEW Result Date: 08/22/2024 CLINICAL DATA:  Respiratory failure EXAM: PORTABLE CHEST 1 VIEW COMPARISON:  08/21/2024 FINDINGS: Large right pleural effusion again noted. Continued right lower lobe airspace disease. Improving right upper lobe airspace disease. Patchy airspace disease throughout the left lung, stable. Heart likely mildly enlarged. Aortic atherosclerosis. IMPRESSION: Large right pleural effusion with some improvement in the right upper lobe consolidation. Otherwise no change.  Electronically Signed   By: Franky Crease M.D.   On: 08/22/2024 14:28   DG Chest Port 1 View Result Date: 08/21/2024 CLINICAL DATA:  Status post right thoracentesis yielding 750 cc, known lung cancer EXAM: PORTABLE CHEST 1 VIEW COMPARISON:  08/20/2024, 08/17/2019 FINDINGS: Minimal improvement in the right effusion following thoracentesis. Residual right effusion remains. Similar diffuse mixed interstitial and airspace opacities with right lower lung collapse/consolidation. No significant left effusion. No pneumothorax. Known right lower lobe central obstructing mass is obscured and better demonstrated by comparison PET-CT. No acute osseous finding. IMPRESSION: 1. Minimal improvement in the right effusion following thoracentesis. No pneumothorax. 2. Similar diffuse mixed interstitial and airspace opacities with right lower lung collapse/consolidation. Electronically Signed   By: CHRISTELLA.  Shick M.D.   On: 08/21/2024 17:28   IR THORACENTESIS ASP PLEURAL SPACE W/IMG GUIDE Result Date: 08/21/2024 INDICATION: Patient with history of lung cancer, dyspnea, recurrent right pleural effusion. Request received for therapeutic right thoracentesis. EXAM: ULTRASOUND GUIDED THERAPEUTIC RIGHT THORACENTESIS MEDICATIONS: 8 mL 1% lidocaine  with epinephrine  COMPLICATIONS: None immediate. PROCEDURE: An ultrasound guided thoracentesis was thoroughly discussed with the patient and questions answered. The benefits, risks, alternatives and complications were also discussed. The patient understands and wishes to proceed with the procedure. Written consent was obtained. Ultrasound was performed to localize and mark an adequate pocket of fluid in the right chest. The area was then prepped and draped in the normal sterile fashion. 1% Lidocaine  was used for local anesthesia. Under ultrasound guidance a 6 Fr Safe-T-Centesis catheter was introduced. Thoracentesis was performed. The catheter was removed and a dressing applied. FINDINGS: A total of  approximately 750 cc of slightly hazy, yellow fluid was removed. IMPRESSION: Successful ultrasound guided therapeutic right thoracentesis yielding 750 cc of pleural fluid. Performed by: Franky Rakers, PA-C Electronically Signed   By: CHRISTELLA.  Shick M.D.   On: 08/21/2024 16:21   DG CHEST PORT 1 VIEW Result Date: 08/20/2024 CLINICAL DATA:  Follow-up right pleural effusion. EXAM: PORTABLE CHEST 1 VIEW COMPARISON:  08/19/2024 and chest CTA dated 08/16/2024. FINDINGS: The heart size can not be assessed due to obscuration of the right heart border. No significant change in confluence and patchy increased density in the right mid and lower lung zones. No significant change in chronic bilateral interstitial lung disease and right lung bullous changes. Stable laterally tracking pleural fluid on the right. Tortuous and partially calcified thoracic aorta. Unremarkable bones. IMPRESSION: 1. No significant change in right mid and lower lung zone pneumonia/atelectasis and right  pleural effusion. 2. Stable chronic bilateral interstitial lung disease and right lung bullous changes. Electronically Signed   By: Elspeth Bathe M.D.   On: 08/20/2024 10:49   DG CHEST PORT 1 VIEW Result Date: 08/19/2024 CLINICAL DATA:  Shortness of breath. EXAM: PORTABLE CHEST 1 VIEW COMPARISON:  08/18/2024. FINDINGS: The cardio pericardial silhouette is enlarged. Right base collapse/consolidation with moderate right effusion is similar. Background diffuse interstitial opacity is similar. No left pleural effusion. Bones are diffusely demineralized. Telemetry leads overlie the chest. IMPRESSION: No substantial interval change. Right base collapse/consolidation with moderate right effusion. Electronically Signed   By: Camellia Candle M.D.   On: 08/19/2024 09:17   DG CHEST PORT 1 VIEW Result Date: 08/18/2024 CLINICAL DATA:  Pleural effusion.  Lung carcinoma.  Thoracentesis. EXAM: PORTABLE CHEST 1 VIEW COMPARISON:  05/18/2024 FINDINGS: Moderate right pleural  effusion shows increase in size since prior exam. Increased right lower lung atelectasis. No pneumothorax visualized. Chronic interstitial lung disease again noted. Stable heart size. IMPRESSION: Increased size of moderate right pleural effusion and right lower lung atelectasis. No pneumothorax visualized. Chronic interstitial lung disease. Electronically Signed   By: Norleen DELENA Kil M.D.   On: 08/18/2024 11:57   CT Angio Chest PE W and/or Wo Contrast Result Date: 08/16/2024 CLINICAL DATA:  Shortness of breath, hemoptysis. History of lung cancer. EXAM: CT ANGIOGRAPHY CHEST WITH CONTRAST TECHNIQUE: Multidetector CT imaging of the chest was performed using the standard protocol during bolus administration of intravenous contrast. Multiplanar CT image reconstructions and MIPs were obtained to evaluate the vascular anatomy. RADIATION DOSE REDUCTION: This exam was performed according to the departmental dose-optimization program which includes automated exposure control, adjustment of the mA and/or kV according to patient size and/or use of iterative reconstruction technique. CONTRAST:  75mL OMNIPAQUE  IOHEXOL  350 MG/ML SOLN COMPARISON:  July 16, 2024.  June 28, 2024. FINDINGS: Cardiovascular: Satisfactory opacification of the pulmonary arteries to the segmental level. No evidence of pulmonary embolism. Mild cardiomegaly. No pericardial effusion. Mediastinum/Nodes: Thyroid  gland is unremarkable. The esophagus is unremarkable. Extensive adenopathy is noted in the subcarinal and right hilar regions as noted on recent PET scan. This mass appears to extend into and occlude the right lower lobe bronchus proximally. Lungs/Pleura: No pneumothorax is noted. Emphysematous disease is noted. 3.7 cm mass noted posteriorly in left lower lobe consistent with metastatic disease. Moderate size right pleural effusion is noted with adjacent atelectasis or infiltrate of residual right upper lobe. Upper Abdomen: No acute abnormality.  Musculoskeletal: No chest wall abnormality. No acute or significant osseous findings. Review of the MIP images confirms the above findings. IMPRESSION: 1. No definite evidence of pulmonary embolus. 2. Extensive subcarinal and right hilar adenopathy is noted as noted on recent PET scan. This mass appears to extend into and occlude the right lower lobe bronchus proximally. 3. Moderate size right pleural effusion is noted with adjacent atelectasis or infiltrate of residual right upper lobe. 4. 3.7 cm mass noted posteriorly in left lower lobe consistent with metastatic disease. 5. Emphysema. Aortic Atherosclerosis (ICD10-I70.0) and Emphysema (ICD10-J43.9). Electronically Signed   By: Lynwood Landy Raddle M.D.   On: 08/16/2024 17:30     ASSESSMENT/PLAN:  This is a very pleasant 73 year old African-American female with:  1) history of stage Ia non-small cell lung cancer, adenocarcinoma the right upper lobe who under went radiation under the care of Dr. Patrcia from 04/23/2022-04/29/22 2 Recurrent lung cancer, initially diagnosed as Stage IIIa confirm (T3, N2, M0) non-small cell lung cancer, squamous cell carcinoma. She  presented with recurrent tumor at the right lung base, associated right lower lobe metastasis, and right hilar and subcarinal nodal metastases. This was diagnosed in October 2024.    PDL1 expression 85%.    The patient is scheduled to undergo consolidation immunotherapy with Imfinzi  1500 mg IV every 4 weeks . First dose on 02/07/24.  She is status post 1 cycle.  Her treatment was discontinued due to concerns with pneumonitis.   She was found to have disease progresion in July 2025.    Therefore, Dr. Sherrod recommended palliative systemic chemotherapy with carboplatin  for an AUC of 5 and taxol  175 mg/m2 and Keytruda  200 mg IV every 3 weeks.  She is status post 1 cycle. She underwent cycle #1 on 07/23/24   The patient was seen by Dr. Sherrod today. Labs were reviewed recommend she *** with cycle #2.  ***Dose.   He was advised to avoid gas producing foods and continue Gas-X.  Her pain may also be secondary to coughing.   ***Thora vs pleurx  ***Weekly labs  ***cards and pulm.     We will see her in 3 weeks for evaluation and repeat blood work before cycle #3.     The patient was advised to call immediately if she has any concerning symptoms in the interval. The patient voices understanding of current disease status and treatment options and is in agreement with the current care plan. All questions were answered. The patient knows to call the clinic with any problems, questions or concerns. We can certainly see the patient much sooner if necessary    No orders of the defined types were placed in this encounter.    I spent {CHL ONC TIME VISIT - DTPQU:[id number]} counseling the patient face to face. The total time spent in the appointment was {CHL ONC TIME VISIT - DTPQU:[id number]}.  Caprice Mccaffrey L Doloras [place], PA-C 08/27/24

## 2024-08-28 ENCOUNTER — Encounter: Payer: Self-pay | Admitting: Internal Medicine

## 2024-08-28 ENCOUNTER — Ambulatory Visit (HOSPITAL_COMMUNITY)
Admission: RE | Admit: 2024-08-28 | Discharge: 2024-08-28 | Disposition: A | Discharge status: Home / Self Care | Source: Ambulatory Visit | Attending: Physician Assistant | Admitting: Physician Assistant

## 2024-08-28 ENCOUNTER — Inpatient Hospital Stay (HOSPITAL_BASED_OUTPATIENT_CLINIC_OR_DEPARTMENT_OTHER): Admitting: Physician Assistant

## 2024-08-28 ENCOUNTER — Inpatient Hospital Stay: Attending: Radiation Oncology

## 2024-08-28 VITALS — BP 126/58 | HR 89 | Temp 98.5°F | Resp 16 | Ht 64.0 in | Wt 146.9 lb

## 2024-08-28 DIAGNOSIS — J849 Interstitial pulmonary disease, unspecified: Secondary | ICD-10-CM | POA: Insufficient documentation

## 2024-08-28 DIAGNOSIS — Z85118 Personal history of other malignant neoplasm of bronchus and lung: Secondary | ICD-10-CM | POA: Insufficient documentation

## 2024-08-28 DIAGNOSIS — R000 Tachycardia, unspecified: Secondary | ICD-10-CM | POA: Diagnosis not present

## 2024-08-28 DIAGNOSIS — C7801 Secondary malignant neoplasm of right lung: Secondary | ICD-10-CM | POA: Diagnosis not present

## 2024-08-28 DIAGNOSIS — B370 Candidal stomatitis: Secondary | ICD-10-CM | POA: Insufficient documentation

## 2024-08-28 DIAGNOSIS — J90 Pleural effusion, not elsewhere classified: Secondary | ICD-10-CM | POA: Insufficient documentation

## 2024-08-28 DIAGNOSIS — E119 Type 2 diabetes mellitus without complications: Secondary | ICD-10-CM | POA: Insufficient documentation

## 2024-08-28 DIAGNOSIS — Z9221 Personal history of antineoplastic chemotherapy: Secondary | ICD-10-CM | POA: Insufficient documentation

## 2024-08-28 DIAGNOSIS — Z5111 Encounter for antineoplastic chemotherapy: Secondary | ICD-10-CM | POA: Insufficient documentation

## 2024-08-28 DIAGNOSIS — C3431 Malignant neoplasm of lower lobe, right bronchus or lung: Secondary | ICD-10-CM

## 2024-08-28 DIAGNOSIS — C772 Secondary and unspecified malignant neoplasm of intra-abdominal lymph nodes: Secondary | ICD-10-CM | POA: Insufficient documentation

## 2024-08-28 DIAGNOSIS — Z923 Personal history of irradiation: Secondary | ICD-10-CM | POA: Insufficient documentation

## 2024-08-28 DIAGNOSIS — Z794 Long term (current) use of insulin: Secondary | ICD-10-CM | POA: Diagnosis not present

## 2024-08-28 DIAGNOSIS — J439 Emphysema, unspecified: Secondary | ICD-10-CM | POA: Diagnosis not present

## 2024-08-28 DIAGNOSIS — M1990 Unspecified osteoarthritis, unspecified site: Secondary | ICD-10-CM | POA: Diagnosis not present

## 2024-08-28 DIAGNOSIS — R042 Hemoptysis: Secondary | ICD-10-CM | POA: Insufficient documentation

## 2024-08-28 DIAGNOSIS — Z9981 Dependence on supplemental oxygen: Secondary | ICD-10-CM | POA: Diagnosis not present

## 2024-08-28 DIAGNOSIS — Z7984 Long term (current) use of oral hypoglycemic drugs: Secondary | ICD-10-CM | POA: Insufficient documentation

## 2024-08-28 DIAGNOSIS — R630 Anorexia: Secondary | ICD-10-CM | POA: Insufficient documentation

## 2024-08-28 DIAGNOSIS — I119 Hypertensive heart disease without heart failure: Secondary | ICD-10-CM | POA: Diagnosis not present

## 2024-08-28 DIAGNOSIS — I700 Atherosclerosis of aorta: Secondary | ICD-10-CM | POA: Diagnosis not present

## 2024-08-28 DIAGNOSIS — C3411 Malignant neoplasm of upper lobe, right bronchus or lung: Secondary | ICD-10-CM | POA: Insufficient documentation

## 2024-08-28 DIAGNOSIS — Z7982 Long term (current) use of aspirin: Secondary | ICD-10-CM | POA: Diagnosis not present

## 2024-08-28 DIAGNOSIS — Z79899 Other long term (current) drug therapy: Secondary | ICD-10-CM | POA: Insufficient documentation

## 2024-08-28 DIAGNOSIS — D649 Anemia, unspecified: Secondary | ICD-10-CM

## 2024-08-28 LAB — CMP (CANCER CENTER ONLY)
ALT: <5 U/L (ref 0–44)
AST: 15 U/L (ref 15–41)
Albumin: 3.1 g/dL — ABNORMAL LOW (ref 3.5–5.0)
Alkaline Phosphatase: 66 U/L (ref 38–126)
Anion gap: 12 (ref 5–15)
BUN: 10 mg/dL (ref 8–23)
CO2: 26 mmol/L (ref 22–32)
Calcium: 9.4 mg/dL (ref 8.9–10.3)
Chloride: 99 mmol/L (ref 98–111)
Creatinine: 0.55 mg/dL (ref 0.44–1.00)
GFR, Estimated: >60 mL/min (ref >60)
Glucose, Bld: 221 mg/dL — ABNORMAL HIGH (ref 70–99)
Potassium: 3.7 mmol/L (ref 3.5–5.1)
Sodium: 137 mmol/L (ref 135–145)
Total Bilirubin: 0.4 mg/dL (ref 0.0–1.2)
Total Protein: 6.8 g/dL (ref 6.5–8.1)

## 2024-08-28 LAB — CBC WITH DIFFERENTIAL (CANCER CENTER ONLY)
Abs Immature Granulocytes: 0.06 K/uL (ref 0.00–0.07)
Basophils Absolute: 0.1 K/uL (ref 0.0–0.1)
Basophils Relative: 1 %
Eosinophils Absolute: 0.1 K/uL (ref 0.0–0.5)
Eosinophils Relative: 1 %
HCT: 30.2 % — ABNORMAL LOW (ref 36.0–46.0)
Hemoglobin: 9.2 g/dL — ABNORMAL LOW (ref 12.0–15.0)
Immature Granulocytes: 1 %
Lymphocytes Relative: 7 %
Lymphs Abs: 0.9 K/uL (ref 0.7–4.0)
MCH: 25.6 pg — ABNORMAL LOW (ref 26.0–34.0)
MCHC: 30.5 g/dL (ref 30.0–36.0)
MCV: 83.9 fL (ref 80.0–100.0)
Monocytes Absolute: 1.1 K/uL — ABNORMAL HIGH (ref 0.1–1.0)
Monocytes Relative: 9 %
Neutro Abs: 10.4 K/uL — ABNORMAL HIGH (ref 1.7–7.7)
Neutrophils Relative %: 81 %
Platelet Count: 445 K/uL — ABNORMAL HIGH (ref 150–400)
RBC: 3.6 MIL/uL — ABNORMAL LOW (ref 3.87–5.11)
RDW: 17.8 % — ABNORMAL HIGH (ref 11.5–15.5)
WBC Count: 12.6 K/uL — ABNORMAL HIGH (ref 4.0–10.5)
nRBC: 0 % (ref 0.0–0.2)

## 2024-08-28 LAB — SAMPLE TO BLOOD BANK

## 2024-08-28 MED ORDER — NYSTATIN 100000 UNIT/ML MT SUSP: 5.0000 mL | Freq: Four times a day (QID) | OROMUCOSAL | 0 refills | Status: DC

## 2024-08-29 ENCOUNTER — Inpatient Hospital Stay

## 2024-08-29 ENCOUNTER — Telehealth: Payer: Self-pay | Admitting: Physician Assistant

## 2024-08-29 NOTE — Telephone Encounter (Signed)
 Rescheduled appointments per provider treatment plan changes. Talked with the patient and she is aware of the changes made to her upcoming appointments.

## 2024-08-30 ENCOUNTER — Other Ambulatory Visit: Payer: Self-pay

## 2024-08-30 ENCOUNTER — Ambulatory Visit (HOSPITAL_COMMUNITY)
Admission: RE | Admit: 2024-08-30 | Discharge: 2024-08-30 | Disposition: A | Discharge status: Home / Self Care | Source: Ambulatory Visit | Attending: Physician Assistant | Admitting: Physician Assistant

## 2024-08-30 ENCOUNTER — Ambulatory Visit (HOSPITAL_COMMUNITY)
Admission: RE | Admit: 2024-08-30 | Discharge: 2024-08-30 | Disposition: A | Discharge status: Home / Self Care | Source: Ambulatory Visit | Attending: Radiology | Admitting: Radiology

## 2024-08-30 DIAGNOSIS — C3431 Malignant neoplasm of lower lobe, right bronchus or lung: Secondary | ICD-10-CM | POA: Diagnosis not present

## 2024-08-30 DIAGNOSIS — J90 Pleural effusion, not elsewhere classified: Secondary | ICD-10-CM | POA: Insufficient documentation

## 2024-08-30 MED ORDER — LIDOCAINE-EPINEPHRINE 1 %-1:100000 IJ SOLN: 20.0000 mL | Freq: Once | INTRAMUSCULAR | Status: DC

## 2024-08-30 MED ORDER — LIDOCAINE-EPINEPHRINE 1 %-1:100000 IJ SOLN
INTRAMUSCULAR | Status: AC
Start: 2024-08-30 — End: 2024-08-30
  Filled 2024-08-30: qty 1

## 2024-08-30 NOTE — Procedures (Signed)
 Ultrasound-guided diagnostic and therapeutic right thoracentesis performed yielding 725 cc of slightly hazy, yellow  fluid. No immediate complications. Follow-up chest x-ray pending. The fluid was sent to the lab for cytology. EBL none.

## 2024-08-31 ENCOUNTER — Ambulatory Visit

## 2024-09-03 ENCOUNTER — Ambulatory Visit

## 2024-09-03 ENCOUNTER — Other Ambulatory Visit

## 2024-09-03 ENCOUNTER — Ambulatory Visit: Admitting: Internal Medicine

## 2024-09-03 LAB — CYTOLOGY - NON PAP

## 2024-09-04 ENCOUNTER — Encounter: Payer: Self-pay | Admitting: Internal Medicine

## 2024-09-04 ENCOUNTER — Inpatient Hospital Stay

## 2024-09-04 VITALS — BP 114/63 | HR 95 | Temp 98.3°F | Resp 18 | Wt 143.8 lb

## 2024-09-04 DIAGNOSIS — C3431 Malignant neoplasm of lower lobe, right bronchus or lung: Secondary | ICD-10-CM

## 2024-09-04 DIAGNOSIS — D649 Anemia, unspecified: Secondary | ICD-10-CM

## 2024-09-04 DIAGNOSIS — C3411 Malignant neoplasm of upper lobe, right bronchus or lung: Secondary | ICD-10-CM | POA: Diagnosis not present

## 2024-09-04 LAB — CBC WITH DIFFERENTIAL (CANCER CENTER ONLY)
Abs Immature Granulocytes: 0.06 K/uL (ref 0.00–0.07)
Basophils Absolute: 0.1 K/uL (ref 0.0–0.1)
Basophils Relative: 1 %
Eosinophils Absolute: 0.1 K/uL (ref 0.0–0.5)
Eosinophils Relative: 1 %
HCT: 28.6 % — ABNORMAL LOW (ref 36.0–46.0)
Hemoglobin: 8.7 g/dL — ABNORMAL LOW (ref 12.0–15.0)
Immature Granulocytes: 1 %
Lymphocytes Relative: 6 %
Lymphs Abs: 0.8 K/uL (ref 0.7–4.0)
MCH: 25 pg — ABNORMAL LOW (ref 26.0–34.0)
MCHC: 30.4 g/dL (ref 30.0–36.0)
MCV: 82.2 fL (ref 80.0–100.0)
Monocytes Absolute: 0.8 K/uL (ref 0.1–1.0)
Monocytes Relative: 6 %
Neutro Abs: 10.3 K/uL — ABNORMAL HIGH (ref 1.7–7.7)
Neutrophils Relative %: 85 %
Platelet Count: 464 K/uL — ABNORMAL HIGH (ref 150–400)
RBC: 3.48 MIL/uL — ABNORMAL LOW (ref 3.87–5.11)
RDW: 17.8 % — ABNORMAL HIGH (ref 11.5–15.5)
WBC Count: 12.1 K/uL — ABNORMAL HIGH (ref 4.0–10.5)
nRBC: 0.2 % (ref 0.0–0.2)

## 2024-09-04 LAB — CMP (CANCER CENTER ONLY)
ALT: <5 U/L (ref 0–44)
AST: 8 U/L — ABNORMAL LOW (ref 15–41)
Albumin: 3.3 g/dL — ABNORMAL LOW (ref 3.5–5.0)
Alkaline Phosphatase: 71 U/L (ref 38–126)
Anion gap: 8 (ref 5–15)
BUN: 8 mg/dL (ref 8–23)
CO2: 25 mmol/L (ref 22–32)
Calcium: 9.9 mg/dL (ref 8.9–10.3)
Chloride: 102 mmol/L (ref 98–111)
Creatinine: 0.51 mg/dL (ref 0.44–1.00)
GFR, Estimated: >60 mL/min (ref >60)
Glucose, Bld: 194 mg/dL — ABNORMAL HIGH (ref 70–99)
Potassium: 4.1 mmol/L (ref 3.5–5.1)
Sodium: 135 mmol/L (ref 135–145)
Total Bilirubin: 0.3 mg/dL (ref 0.0–1.2)
Total Protein: 7.2 g/dL (ref 6.5–8.1)

## 2024-09-04 LAB — SAMPLE TO BLOOD BANK

## 2024-09-04 MED ORDER — ACETAMINOPHEN 325 MG PO TABS
650.0000 mg | ORAL_TABLET | Freq: Once | ORAL | Status: AC
  Administered 2024-09-04: 650 mg via ORAL
  Filled 2024-09-04: qty 2

## 2024-09-04 MED ORDER — DIPHENHYDRAMINE HCL 50 MG/ML IJ SOLN
50.0000 mg | Freq: Once | INTRAMUSCULAR | Status: AC
  Administered 2024-09-04: 50 mg via INTRAVENOUS
  Filled 2024-09-04: qty 1

## 2024-09-04 MED ORDER — SODIUM CHLORIDE 0.9 % IV SOLN
310.8000 mg | Freq: Once | INTRAVENOUS | Status: AC
  Administered 2024-09-04: 310 mg via INTRAVENOUS
  Filled 2024-09-04: qty 31

## 2024-09-04 MED ORDER — DEXAMETHASONE SODIUM PHOSPHATE 10 MG/ML IJ SOLN
10.0000 mg | Freq: Once | INTRAMUSCULAR | Status: AC
  Administered 2024-09-04: 10 mg via INTRAVENOUS
  Filled 2024-09-04: qty 1

## 2024-09-04 MED ORDER — FAMOTIDINE IN NACL 20-0.9 MG/50ML-% IV SOLN
20.0000 mg | Freq: Once | INTRAVENOUS | Status: AC
  Administered 2024-09-04: 20 mg via INTRAVENOUS
  Filled 2024-09-04: qty 50

## 2024-09-04 MED ORDER — ACETAMINOPHEN 325 MG PO TABS
ORAL_TABLET | ORAL | Status: AC
  Filled 2024-09-04: qty 1

## 2024-09-04 MED ORDER — PALONOSETRON HCL INJECTION 0.25 MG/5ML
0.2500 mg | Freq: Once | INTRAVENOUS | Status: AC
  Administered 2024-09-04: 0.25 mg via INTRAVENOUS
  Filled 2024-09-04: qty 5

## 2024-09-04 MED ORDER — APREPITANT 130 MG/18ML IV EMUL
130.0000 mg | Freq: Once | INTRAVENOUS | Status: AC
  Administered 2024-09-04: 130 mg via INTRAVENOUS
  Filled 2024-09-04: qty 18

## 2024-09-04 MED ORDER — SODIUM CHLORIDE 0.9 % IV SOLN
200.0000 mg | Freq: Once | INTRAVENOUS | Status: AC
  Administered 2024-09-04: 200 mg via INTRAVENOUS
  Filled 2024-09-04: qty 200

## 2024-09-04 MED ORDER — SODIUM CHLORIDE 0.9 % IV SOLN
150.0000 mg/m2 | Freq: Once | INTRAVENOUS | Status: AC
  Administered 2024-09-04: 258 mg via INTRAVENOUS
  Filled 2024-09-04: qty 43

## 2024-09-04 MED ORDER — SODIUM CHLORIDE 0.9 % IV SOLN: INTRAVENOUS | Status: DC

## 2024-09-04 NOTE — Patient Instructions (Addendum)
 [facility] - A DEPT OF [facility]  Discharge Instructions: Thank you for choosing [facility] to provide your oncology and hematology care.   If you have a lab appointment with the Cancer Center, please go directly to the Cancer Center and check in at the registration area.   Wear comfortable clothing and clothing appropriate for easy access to any Portacath or PICC line.   We strive to give you quality time with your provider. You may need to reschedule your appointment if you arrive late (15 or more minutes).  Arriving late affects you and other patients whose appointments are after yours.  Also, if you miss three or more appointments without notifying the office, you may be dismissed from the clinic at the provider's discretion.      For prescription refill requests, have your pharmacy contact our office and allow 72 hours for refills to be completed.    Today you received the following chemotherapy and/or immunotherapy agents: Keytruda, Taxol, Carboplatin      To help prevent nausea and vomiting after your treatment, we encourage you to take your nausea medication as directed.  BELOW ARE SYMPTOMS THAT SHOULD BE REPORTED IMMEDIATELY: *FEVER GREATER THAN 100.4 F (38 C) OR HIGHER *CHILLS OR SWEATING *NAUSEA AND VOMITING THAT IS NOT CONTROLLED WITH YOUR NAUSEA MEDICATION *UNUSUAL SHORTNESS OF BREATH *UNUSUAL BRUISING OR BLEEDING *URINARY PROBLEMS (pain or burning when urinating, or frequent urination) *BOWEL PROBLEMS (unusual diarrhea, constipation, pain near the anus) TENDERNESS IN MOUTH AND THROAT WITH OR WITHOUT PRESENCE OF ULCERS (sore throat, sores in mouth, or a toothache) UNUSUAL RASH, SWELLING OR PAIN  UNUSUAL VAGINAL DISCHARGE OR ITCHING   Items with * indicate a potential emergency and should be followed up as soon as possible or go to the Emergency Department if any problems should occur.  Please show the CHEMOTHERAPY ALERT  CARD or IMMUNOTHERAPY ALERT CARD at check-in to the Emergency Department and triage nurse.  Should you have questions after your visit or need to cancel or reschedule your appointment, please contact [facility] - A DEPT OF [facility]  Dept: [phone]  and follow the prompts.  Office hours are 8:00 a.m. to 4:30 p.m. Monday - Friday. Please note that voicemails left after 4:00 p.m. may not be returned until the following business day.  We are closed weekends and major holidays. You have access to a nurse at all times for urgent questions. Please call the main number to the clinic Dept: [phone] and follow the prompts.   For any non-urgent questions, you may also contact your provider using MyChart. We now offer e-Visits for anyone 40 and older to request care online for non-urgent symptoms. For details visit [email or web address].   Also download the MyChart app! Go to the app store, search "MyChart", open the app, select [place], and log in with your MyChart username and password.

## 2024-09-05 ENCOUNTER — Other Ambulatory Visit

## 2024-09-05 ENCOUNTER — Ambulatory Visit

## 2024-09-06 ENCOUNTER — Other Ambulatory Visit

## 2024-09-06 ENCOUNTER — Telehealth: Payer: Self-pay | Admitting: Medical Oncology

## 2024-09-06 ENCOUNTER — Inpatient Hospital Stay

## 2024-09-06 ENCOUNTER — Ambulatory Visit: Admitting: Internal Medicine

## 2024-09-06 ENCOUNTER — Encounter: Payer: Self-pay | Admitting: Pulmonary Disease

## 2024-09-06 NOTE — Telephone Encounter (Signed)
 Injection rescheduled. Pt does not feel well today because of abdominal pain . She cancelled her injection of GCSF.    She described it as  like gas and everything. Today the pain is in her back. She took 1000 mg tylenol  and felt better after that.  She took compazine   and ondansetron  and dexamethasone  today .   I told her next time she has these gas like pains occur to take Gas-X. Injection r/s,

## 2024-09-07 ENCOUNTER — Ambulatory Visit

## 2024-09-10 ENCOUNTER — Ambulatory Visit: Admitting: Internal Medicine

## 2024-09-10 ENCOUNTER — Ambulatory Visit

## 2024-09-10 ENCOUNTER — Encounter (HOSPITAL_COMMUNITY): Payer: Self-pay

## 2024-09-10 ENCOUNTER — Emergency Department (HOSPITAL_COMMUNITY)

## 2024-09-10 ENCOUNTER — Inpatient Hospital Stay (HOSPITAL_COMMUNITY)
Admission: EM | Admit: 2024-09-10 | Discharge: 2024-09-14 | DRG: 181 | Disposition: A | Discharge status: Skilled Nursing Facility | Attending: Internal Medicine | Admitting: Internal Medicine

## 2024-09-10 ENCOUNTER — Other Ambulatory Visit

## 2024-09-10 ENCOUNTER — Other Ambulatory Visit: Payer: Self-pay

## 2024-09-10 ENCOUNTER — Telehealth: Payer: Self-pay | Admitting: Medical Oncology

## 2024-09-10 ENCOUNTER — Inpatient Hospital Stay: Attending: Radiation Oncology | Admitting: Dietician

## 2024-09-10 ENCOUNTER — Inpatient Hospital Stay

## 2024-09-10 ENCOUNTER — Encounter: Payer: Self-pay | Admitting: Pulmonary Disease

## 2024-09-10 DIAGNOSIS — Z1152 Encounter for screening for COVID-19: Secondary | ICD-10-CM | POA: Diagnosis not present

## 2024-09-10 DIAGNOSIS — E119 Type 2 diabetes mellitus without complications: Secondary | ICD-10-CM | POA: Diagnosis present

## 2024-09-10 DIAGNOSIS — D649 Anemia, unspecified: Secondary | ICD-10-CM | POA: Diagnosis not present

## 2024-09-10 DIAGNOSIS — R809 Proteinuria, unspecified: Secondary | ICD-10-CM | POA: Diagnosis present

## 2024-09-10 DIAGNOSIS — E860 Dehydration: Secondary | ICD-10-CM | POA: Diagnosis present

## 2024-09-10 DIAGNOSIS — I2510 Atherosclerotic heart disease of native coronary artery without angina pectoris: Secondary | ICD-10-CM | POA: Diagnosis present

## 2024-09-10 DIAGNOSIS — C3431 Malignant neoplasm of lower lobe, right bronchus or lung: Secondary | ICD-10-CM | POA: Diagnosis present

## 2024-09-10 DIAGNOSIS — J90 Pleural effusion, not elsewhere classified: Secondary | ICD-10-CM

## 2024-09-10 DIAGNOSIS — J439 Emphysema, unspecified: Secondary | ICD-10-CM | POA: Diagnosis present

## 2024-09-10 DIAGNOSIS — I4719 Other supraventricular tachycardia: Secondary | ICD-10-CM | POA: Diagnosis present

## 2024-09-10 DIAGNOSIS — Z881 Allergy status to other antibiotic agents status: Secondary | ICD-10-CM

## 2024-09-10 DIAGNOSIS — I10 Essential (primary) hypertension: Secondary | ICD-10-CM | POA: Diagnosis present

## 2024-09-10 DIAGNOSIS — Z7982 Long term (current) use of aspirin: Secondary | ICD-10-CM | POA: Diagnosis not present

## 2024-09-10 DIAGNOSIS — N390 Urinary tract infection, site not specified: Secondary | ICD-10-CM | POA: Diagnosis not present

## 2024-09-10 DIAGNOSIS — J9611 Chronic respiratory failure with hypoxia: Secondary | ICD-10-CM | POA: Diagnosis present

## 2024-09-10 DIAGNOSIS — Z923 Personal history of irradiation: Secondary | ICD-10-CM

## 2024-09-10 DIAGNOSIS — T783XXA Angioneurotic edema, initial encounter: Secondary | ICD-10-CM | POA: Diagnosis present

## 2024-09-10 DIAGNOSIS — E1129 Type 2 diabetes mellitus with other diabetic kidney complication: Secondary | ICD-10-CM | POA: Diagnosis present

## 2024-09-10 DIAGNOSIS — Z9981 Dependence on supplemental oxygen: Secondary | ICD-10-CM

## 2024-09-10 DIAGNOSIS — R531 Weakness: Principal | ICD-10-CM | POA: Diagnosis present

## 2024-09-10 DIAGNOSIS — Z794 Long term (current) use of insulin: Secondary | ICD-10-CM

## 2024-09-10 DIAGNOSIS — Z79899 Other long term (current) drug therapy: Secondary | ICD-10-CM

## 2024-09-10 DIAGNOSIS — Z6824 Body mass index (BMI) 24.0-24.9, adult: Secondary | ICD-10-CM

## 2024-09-10 DIAGNOSIS — Z886 Allergy status to analgesic agent status: Secondary | ICD-10-CM

## 2024-09-10 DIAGNOSIS — I959 Hypotension, unspecified: Secondary | ICD-10-CM | POA: Diagnosis present

## 2024-09-10 DIAGNOSIS — Z66 Do not resuscitate: Secondary | ICD-10-CM | POA: Diagnosis present

## 2024-09-10 DIAGNOSIS — J441 Chronic obstructive pulmonary disease with (acute) exacerbation: Secondary | ICD-10-CM | POA: Diagnosis present

## 2024-09-10 DIAGNOSIS — B370 Candidal stomatitis: Secondary | ICD-10-CM | POA: Diagnosis present

## 2024-09-10 DIAGNOSIS — D630 Anemia in neoplastic disease: Secondary | ICD-10-CM | POA: Diagnosis present

## 2024-09-10 DIAGNOSIS — J910 Malignant pleural effusion: Secondary | ICD-10-CM | POA: Diagnosis present

## 2024-09-10 DIAGNOSIS — Z885 Allergy status to narcotic agent status: Secondary | ICD-10-CM

## 2024-09-10 DIAGNOSIS — Z825 Family history of asthma and other chronic lower respiratory diseases: Secondary | ICD-10-CM

## 2024-09-10 DIAGNOSIS — Z888 Allergy status to other drugs, medicaments and biological substances status: Secondary | ICD-10-CM

## 2024-09-10 DIAGNOSIS — E871 Hypo-osmolality and hyponatremia: Secondary | ICD-10-CM | POA: Diagnosis present

## 2024-09-10 DIAGNOSIS — Z7984 Long term (current) use of oral hypoglycemic drugs: Secondary | ICD-10-CM

## 2024-09-10 DIAGNOSIS — J449 Chronic obstructive pulmonary disease, unspecified: Secondary | ICD-10-CM | POA: Diagnosis present

## 2024-09-10 DIAGNOSIS — J849 Interstitial pulmonary disease, unspecified: Secondary | ICD-10-CM | POA: Diagnosis present

## 2024-09-10 DIAGNOSIS — T451X5A Adverse effect of antineoplastic and immunosuppressive drugs, initial encounter: Secondary | ICD-10-CM | POA: Diagnosis present

## 2024-09-10 DIAGNOSIS — R627 Adult failure to thrive: Secondary | ICD-10-CM | POA: Diagnosis present

## 2024-09-10 DIAGNOSIS — Z87891 Personal history of nicotine dependence: Secondary | ICD-10-CM

## 2024-09-10 LAB — HEPATIC FUNCTION PANEL
ALT: <5 U/L (ref 0–44)
AST: 11 U/L — ABNORMAL LOW (ref 15–41)
Albumin: 3.5 g/dL (ref 3.5–5.0)
Alkaline Phosphatase: 68 U/L (ref 38–126)
Bilirubin, Direct: 0.2 mg/dL (ref 0.0–0.2)
Indirect Bilirubin: 0.2 mg/dL — ABNORMAL LOW (ref 0.3–0.9)
Total Bilirubin: 0.3 mg/dL (ref 0.0–1.2)
Total Protein: 7.1 g/dL (ref 6.5–8.1)

## 2024-09-10 LAB — CBC WITH DIFFERENTIAL/PLATELET
Abs Immature Granulocytes: 0.08 K/uL — ABNORMAL HIGH (ref 0.00–0.07)
Basophils Absolute: 0 K/uL (ref 0.0–0.1)
Basophils Relative: 0 %
Eosinophils Absolute: 0.4 K/uL (ref 0.0–0.5)
Eosinophils Relative: 6 %
HCT: 33.2 % — ABNORMAL LOW (ref 36.0–46.0)
Hemoglobin: 9.4 g/dL — ABNORMAL LOW (ref 12.0–15.0)
Immature Granulocytes: 1 %
Lymphocytes Relative: 10 %
Lymphs Abs: 0.7 K/uL (ref 0.7–4.0)
MCH: 23.8 pg — ABNORMAL LOW (ref 26.0–34.0)
MCHC: 28.3 g/dL — ABNORMAL LOW (ref 30.0–36.0)
MCV: 84.1 fL (ref 80.0–100.0)
Monocytes Absolute: 0.2 K/uL (ref 0.1–1.0)
Monocytes Relative: 3 %
Neutro Abs: 5.8 K/uL (ref 1.7–7.7)
Neutrophils Relative %: 80 %
Platelets: 497 K/uL — ABNORMAL HIGH (ref 150–400)
RBC: 3.95 MIL/uL (ref 3.87–5.11)
RDW: 16.8 % — ABNORMAL HIGH (ref 11.5–15.5)
WBC: 7.2 K/uL (ref 4.0–10.5)
nRBC: 0 % (ref 0.0–0.2)

## 2024-09-10 LAB — PRO BRAIN NATRIURETIC PEPTIDE: Pro Brain Natriuretic Peptide: 467 pg/mL — ABNORMAL HIGH (ref <300.0)

## 2024-09-10 LAB — RESP PANEL BY RT-PCR (RSV, FLU A&B, COVID)  RVPGX2
Influenza A by PCR: NEGATIVE
Influenza B by PCR: NEGATIVE
Resp Syncytial Virus by PCR: NEGATIVE
SARS Coronavirus 2 by RT PCR: NEGATIVE

## 2024-09-10 LAB — BASIC METABOLIC PANEL WITH GFR
Anion gap: 10 (ref 5–15)
BUN: 20 mg/dL (ref 8–23)
CO2: 29 mmol/L (ref 22–32)
Calcium: 12.2 mg/dL — ABNORMAL HIGH (ref 8.9–10.3)
Chloride: 94 mmol/L — ABNORMAL LOW (ref 98–111)
Creatinine, Ser: 0.53 mg/dL (ref 0.44–1.00)
GFR, Estimated: >60 mL/min (ref >60)
Glucose, Bld: 193 mg/dL — ABNORMAL HIGH (ref 70–99)
Potassium: 4.1 mmol/L (ref 3.5–5.1)
Sodium: 132 mmol/L — ABNORMAL LOW (ref 135–145)

## 2024-09-10 LAB — MAGNESIUM: Magnesium: 1.4 mg/dL — ABNORMAL LOW (ref 1.7–2.4)

## 2024-09-10 LAB — TROPONIN T, HIGH SENSITIVITY
Troponin T High Sensitivity: 25 ng/L — ABNORMAL HIGH (ref 0–19)
Troponin T High Sensitivity: 25 ng/L — ABNORMAL HIGH (ref 0–19)

## 2024-09-10 LAB — GLUCOSE, CAPILLARY: Glucose-Capillary: 229 mg/dL — ABNORMAL HIGH (ref 70–99)

## 2024-09-10 MED ORDER — ENOXAPARIN SODIUM 40 MG/0.4ML IJ SOSY
40.0000 mg | PREFILLED_SYRINGE | INTRAMUSCULAR | Status: DC
  Administered 2024-09-10 – 2024-09-13 (×4): 40 mg via SUBCUTANEOUS
  Filled 2024-09-10 (×4): qty 0.4

## 2024-09-10 MED ORDER — MAGNESIUM SULFATE 4 GM/100ML IV SOLN
4.0000 g | Freq: Once | INTRAVENOUS | Status: AC
Start: 2024-09-10 — End: 2024-09-10
  Administered 2024-09-10: 4 g via INTRAVENOUS
  Filled 2024-09-10: qty 100

## 2024-09-10 MED ORDER — UMECLIDINIUM-VILANTEROL 62.5-25 MCG/ACT IN AEPB
1.0000 | INHALATION_SPRAY | Freq: Every day | RESPIRATORY_TRACT | Status: DC
  Administered 2024-09-11 – 2024-09-14 (×4): 1 via RESPIRATORY_TRACT
  Filled 2024-09-10: qty 14

## 2024-09-10 MED ORDER — MAGNESIUM SULFATE 2 GM/50ML IV SOLN
2.0000 g | Freq: Once | INTRAVENOUS | Status: AC
  Administered 2024-09-10: 2 g via INTRAVENOUS
  Filled 2024-09-10: qty 50

## 2024-09-10 MED ORDER — IPRATROPIUM-ALBUTEROL 0.5-2.5 (3) MG/3ML IN SOLN
3.0000 mL | Freq: Four times a day (QID) | RESPIRATORY_TRACT | Status: DC
  Administered 2024-09-10 – 2024-09-11 (×2): 3 mL via RESPIRATORY_TRACT
  Filled 2024-09-10 (×2): qty 3

## 2024-09-10 MED ORDER — PROCHLORPERAZINE EDISYLATE 10 MG/2ML IJ SOLN: 10.0000 mg | INTRAMUSCULAR | Status: DC | PRN

## 2024-09-10 MED ORDER — ONDANSETRON HCL 4 MG/2ML IJ SOLN
4.0000 mg | Freq: Once | INTRAMUSCULAR | Status: AC
  Administered 2024-09-10: 4 mg via INTRAVENOUS
  Filled 2024-09-10: qty 2

## 2024-09-10 MED ORDER — ACETAMINOPHEN 650 MG RE SUPP: 650.0000 mg | Freq: Four times a day (QID) | RECTAL | Status: DC | PRN

## 2024-09-10 MED ORDER — ACETAMINOPHEN 325 MG PO TABS
650.0000 mg | ORAL_TABLET | Freq: Four times a day (QID) | ORAL | Status: DC | PRN
  Administered 2024-09-11 – 2024-09-14 (×4): 650 mg via ORAL
  Filled 2024-09-10 (×4): qty 2

## 2024-09-10 MED ORDER — SODIUM CHLORIDE 0.9 % IV BOLUS
1000.0000 mL | Freq: Once | INTRAVENOUS | Status: AC
  Administered 2024-09-10: 1000 mL via INTRAVENOUS

## 2024-09-10 MED ORDER — INSULIN ASPART 100 UNIT/ML IJ SOLN
0.0000 [IU] | Freq: Three times a day (TID) | INTRAMUSCULAR | Status: DC
  Administered 2024-09-11: 5 [IU] via SUBCUTANEOUS
  Administered 2024-09-11: 8 [IU] via SUBCUTANEOUS
  Administered 2024-09-11: 5 [IU] via SUBCUTANEOUS
  Administered 2024-09-12: 8 [IU] via SUBCUTANEOUS
  Filled 2024-09-10: qty 0.15

## 2024-09-10 MED ORDER — MORPHINE SULFATE (PF) 4 MG/ML IV SOLN
4.0000 mg | Freq: Once | INTRAVENOUS | Status: AC
  Administered 2024-09-10: 4 mg via INTRAVENOUS
  Filled 2024-09-10: qty 1

## 2024-09-10 MED ORDER — GLUCERNA SHAKE PO LIQD
237.0000 mL | Freq: Three times a day (TID) | ORAL | Status: DC
  Administered 2024-09-10 – 2024-09-14 (×12): 237 mL via ORAL
  Filled 2024-09-10 (×14): qty 237

## 2024-09-10 MED ORDER — NYSTATIN 100000 UNIT/ML MT SUSP
5.0000 mL | Freq: Four times a day (QID) | OROMUCOSAL | Status: DC
Start: 2024-09-10 — End: 2024-09-14
  Administered 2024-09-10 – 2024-09-14 (×15): 500000 [IU] via ORAL
  Filled 2024-09-10 (×16): qty 5

## 2024-09-10 MED ORDER — OXYCODONE HCL 5 MG PO TABS
5.0000 mg | ORAL_TABLET | ORAL | Status: DC | PRN
  Administered 2024-09-10 – 2024-09-13 (×6): 5 mg via ORAL
  Filled 2024-09-10 (×6): qty 1

## 2024-09-10 MED ORDER — BISOPROLOL FUMARATE 5 MG PO TABS
10.0000 mg | ORAL_TABLET | Freq: Every day | ORAL | Status: DC
  Administered 2024-09-11 – 2024-09-14 (×4): 10 mg via ORAL
  Filled 2024-09-10 (×4): qty 2

## 2024-09-10 MED ORDER — FLUCONAZOLE 100 MG PO TABS
100.0000 mg | ORAL_TABLET | Freq: Every day | ORAL | Status: AC
  Administered 2024-09-10 – 2024-09-14 (×5): 100 mg via ORAL
  Filled 2024-09-10 (×5): qty 1

## 2024-09-10 MED ORDER — SODIUM CHLORIDE 0.9 % IV SOLN: INTRAVENOUS | Status: DC

## 2024-09-10 NOTE — ED Provider Notes (Signed)
 [place] EMERGENCY DEPARTMENT AT [facility] Provider Note   CSN: [id number] Arrival date & time: 09/10/24  1505     Patient presents with: Weakness   KAILANY DINUNZIO is a 73 y.o. female.   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  Pt said she has been feeling very weak since chemo.  Today, she had an appointment, but did not have any energy to walk down to stairs.  She has a lot of pain to her back and chest.          Prior to Admission medications   Medication Sig Start Date End Date Taking? Authorizing Provider  albuterol  (VENTOLIN  HFA) 108 (90 Base) MCG/ACT inhaler Inhale 2 puffs into the lungs every 6 (six) hours as needed for wheezing or shortness of breath. 08/07/24   Neda Jennet LABOR, MD  aspirin  EC 81 MG tablet Take 81 mg by mouth daily. Swallow whole.    [provider]  bisoprolol  (ZEBETA ) 10 MG tablet Take 1 tablet (10 mg total) by mouth daily. 08/24/24   Patsy Lenis, MD  cetirizine  (ZYRTEC  ALLERGY ) 10 MG tablet Take 1 tablet (10 mg total) by mouth daily. 03/08/24   Lorin Norris, MD  diclofenac Sodium (VOLTAREN) 1 % GEL Apply 1 Application topically daily as needed (pain).    [provider]  diltiazem  (CARDIZEM  CD) 360 MG 24 hr capsule Take 1 capsule (360 mg total) by mouth daily. 08/24/24 08/24/25  Patsy Lenis, MD  empagliflozin  (JARDIANCE ) 25 MG TABS tablet Take 25 mg by mouth daily at 6 PM. 02/08/22   [provider]  ferrous sulfate  325 (65 FE) MG tablet Take 325 mg by mouth daily with breakfast.    [provider]  glipiZIDE  (GLUCOTROL  XL) 5 MG 24 hr tablet Take 1 tablet (5 mg total) by mouth daily with breakfast. 04/11/24   Amin, Ankit C, MD  guaiFENesin -codeine  100-10 MG/5ML syrup Take 5 mLs by mouth 3 (three) times daily as needed for cough. 08/24/24   Patsy Lenis, MD  insulin  aspart (NOVOLOG ) 100 UNIT/ML  FlexPen Inject 0-11 Units into the skin 3 (three) times daily with meals. Check Blood Glucose (BG) and inject per scale: BG <150= 0 unit; BG 150-200= 1 unit; BG 201-250= 3 unit; BG 251-300= 5 unit; BG 301-350= 7 unit; BG 351-400= 9 unit; BG >400= 11 unit and Call Primary Care. Patient taking differently: Inject 0-11 Units into the skin as needed for high blood sugar. Check Blood Glucose (BG) and inject per scale: BG <150= 0 unit; BG 150-200= 1 unit; BG 201-250= 3 unit; BG 251-300= 5 unit; BG 301-350= 7 unit; BG 351-400= 9 unit; BG >400= 11 unit and Call Primary Care. 04/11/24   Amin, Ankit C, MD  ipratropium-albuterol  (DUONEB) 0.5-2.5 (3) MG/3ML SOLN Take 3 mLs by nebulization in the morning and at bedtime. 05/14/24   Ruthell Lauraine FALCON, NP  lidocaine -prilocaine  (EMLA ) cream Apply to affected area once 07/23/24   Sherrod Sherrod, MD  metFORMIN  (GLUCOPHAGE -XR) 500 MG 24 hr tablet Take 1,000 mg by mouth in the morning and at bedtime. 02/08/22   [provider]  nystatin  (MYCOSTATIN ) 100000 UNIT/ML suspension Take 5 mLs (500,000 Units total) by mouth 4 (four) times daily. 08/28/24   Heilingoetter, Cassandra L, PA-C  ondansetron  (ZOFRAN ) 8 MG tablet Take 1 tablet (8 mg total) by mouth every 8 (eight) hours as needed for nausea  or vomiting. Start on the third day after carboplatin . 07/23/24   Sherrod Sherrod, MD  polyethylene glycol (MIRALAX  / GLYCOLAX ) 17 g packet Take 17 g packet by mouth daily. 02/19/24   Jillian Buttery, MD  prochlorperazine  (COMPAZINE ) 10 MG tablet Take 1 tablet (10 mg total) by mouth every 6 (six) hours as needed. 07/17/24   Heilingoetter, Cassandra L, PA-C  umeclidinium-vilanterol (ANORO ELLIPTA ) 62.5-25 MCG/ACT AEPB Inhale 1 puff into the lungs daily. 05/09/24   Ruthell Lauraine FALCON, NP    Allergies: Benzonatate, Lisinopril, Aspirin , Azithromycin , and Codeine     Review of Systems  Constitutional:  Positive for fatigue.  All other systems reviewed and are negative.   Updated Vital  Signs BP (!) 109/58   Pulse 71   Temp (!) 97.5 F (36.4 C) (Oral)   Resp 19   SpO2 96%   Physical Exam Vitals and nursing note reviewed.  Constitutional:      Appearance: Normal appearance.  HENT:     Head: Normocephalic and atraumatic.     Right Ear: External ear normal.     Left Ear: External ear normal.     Nose: Nose normal.     Mouth/Throat:     Mouth: Mucous membranes are dry.  Eyes:     Extraocular Movements: Extraocular movements intact.     Conjunctiva/sclera: Conjunctivae normal.     Pupils: Pupils are equal, round, and reactive to light.  Cardiovascular:     Rate and Rhythm: Normal rate and regular rhythm.     Pulses: Normal pulses.     Heart sounds: Normal heart sounds.  Pulmonary:     Effort: Pulmonary effort is normal.     Breath sounds: Normal breath sounds.  Abdominal:     General: Abdomen is flat. Bowel sounds are normal.     Palpations: Abdomen is soft.  Musculoskeletal:        General: Normal range of motion.     Cervical back: Normal range of motion and neck supple.  Skin:    General: Skin is warm.     Capillary Refill: Capillary refill takes less than 2 seconds.  Neurological:     General: No focal deficit present.     Mental Status: She is alert and oriented to person, place, and time.  Psychiatric:        Mood and Affect: Mood normal.        Behavior: Behavior normal.     (all labs ordered are listed, but only abnormal results are displayed) Labs Reviewed  BASIC METABOLIC PANEL WITH GFR - Abnormal; Notable for the following components:      Result Value   Sodium 132 (*)    Chloride 94 (*)    Glucose, Bld 193 (*)    Calcium  12.2 (*)    All other components within normal limits  PRO BRAIN NATRIURETIC PEPTIDE - Abnormal; Notable for the following components:   Pro Brain Natriuretic Peptide 467.0 (*)    All other components within normal limits  CBC WITH DIFFERENTIAL/PLATELET - Abnormal; Notable for the following components:   Hemoglobin  9.4 (*)    HCT 33.2 (*)    MCH 23.8 (*)    MCHC 28.3 (*)    RDW 16.8 (*)    Platelets 497 (*)    Abs Immature Granulocytes 0.08 (*)    All other components within normal limits  MAGNESIUM  - Abnormal; Notable for the following components:   Magnesium  1.4 (*)    All other components within  normal limits  HEPATIC FUNCTION PANEL - Abnormal; Notable for the following components:   AST 11 (*)    Indirect Bilirubin 0.2 (*)    All other components within normal limits  TROPONIN T, HIGH SENSITIVITY - Abnormal; Notable for the following components:   Troponin T High Sensitivity 25 (*)    All other components within normal limits  RESP PANEL BY RT-PCR (RSV, FLU A&B, COVID)  RVPGX2  COMPREHENSIVE METABOLIC PANEL WITH GFR  CBC  PROTIME-INR  URINALYSIS, ROUTINE W REFLEX MICROSCOPIC  MAGNESIUM   TROPONIN T, HIGH SENSITIVITY    EKG: EKG Interpretation Date/Time:  Monday September 10 2024 15:24:34 EDT Ventricular Rate:  84 PR Interval:  165 QRS Duration:  72 QT Interval:  342 QTC Calculation: 405 R Axis:   66  Text Interpretation: Sinus rhythm Left atrial enlargement ST elevation, consider inferior injury No significant change since last tracing Confirmed by Dean Clarity [phone]) on 09/10/2024 3:53:14 PM  Radiology: [facility] Chest Port 1 View Result Date: 09/10/2024 EXAM: 1 VIEW XRAY OF THE CHEST 09/10/2024 04:25:34 PM COMPARISON: 08/30/2024 CLINICAL HISTORY: Patient brought in by [facility] for weakness that she's been having since her chemo treatment on 09/04/24. FINDINGS: LUNGS AND PLEURA: Persistent right basilar airspace opacity. Persistent diffuse interstitial prominence. Moderate right pleural effusion with right basilar atelectasis. HEART AND MEDIASTINUM: Atherosclerotic plaque. BONES AND SOFT TISSUES: No acute osseous abnormality. IMPRESSION: 1. Moderate right pleural effusion with right basilar opacities, increased from prior. 2. Similar appearance of interstitial edema. Electronically signed  by: Donnice Mania MD 09/10/2024 04:46 PM EDT RP Workstation: [id number]     Procedures   Medications Ordered in the ED  feeding supplement (GLUCERNA SHAKE) (GLUCERNA SHAKE) liquid 237 mL (237 mLs Oral Given 09/10/24 1736)  bisoprolol  (ZEBETA ) tablet 10 mg (has no administration in time range)  prochlorperazine  (COMPAZINE ) injection 10 mg (has no administration in time range)  ipratropium-albuterol  (DUONEB) 0.5-2.5 (3) MG/3ML nebulizer solution 3 mL (has no administration in time range)  umeclidinium-vilanterol (ANORO ELLIPTA ) 62.5-25 MCG/ACT 1 puff (has no administration in time range)  nystatin  (MYCOSTATIN ) 100000 UNIT/ML suspension 500,000 Units (has no administration in time range)  fluconazole  (DIFLUCAN ) tablet 100 mg (has no administration in time range)  insulin  aspart (novoLOG ) injection 0-15 Units (has no administration in time range)  enoxaparin  (LOVENOX ) injection 40 mg (has no administration in time range)  acetaminophen  (TYLENOL ) tablet 650 mg (has no administration in time range)    Or  acetaminophen  (TYLENOL ) suppository 650 mg (has no administration in time range)  oxyCODONE  (Oxy IR/ROXICODONE ) immediate release tablet 5 mg (has no administration in time range)  0.9 %  sodium chloride  infusion (has no administration in time range)  magnesium  sulfate IVPB 4 g 100 mL (has no administration in time range)  sodium chloride  0.9 % bolus 1,000 mL (0 mLs Intravenous Stopped 09/10/24 1740)  morphine  (PF) 4 MG/ML injection 4 mg (4 mg Intravenous Given 09/10/24 1552)  ondansetron  (ZOFRAN ) injection 4 mg (4 mg Intravenous Given 09/10/24 1553)  magnesium  sulfate IVPB 2 g 50 mL (0 g Intravenous Stopped 09/10/24 1747)                                    Medical Decision Making Amount and/or Complexity of Data Reviewed Labs: ordered. Radiology: ordered.  Risk OTC drugs. Prescription drug management. Decision regarding hospitalization.   This patient presents to the ED for concern of  weakness, this  involves an extensive number of treatment options, and is a complaint that carries with it a high risk of complications and morbidity.  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  The pertinent results include:  cbc with hgb 9.4 (8.7 on 9/9); bmp nl except for glucose elevated at 193, calcium  elevated at 12.2; lfts nl; mg low at 1.4; trop sl elevated at 25; bnp sl elevated at 467; covid/flu/rsv neg   Imaging Studies ordered:  I ordered imaging studies including cxr  I independently visualized and interpreted imaging which showed   Moderate right pleural effusion with right basilar opacities, increased from  prior.  2. Similar appearance of interstitial edema.   I agree with the radiologist interpretation   Cardiac Monitoring:  The patient was maintained on a cardiac monitor.  I personally viewed and interpreted the cardiac monitored which showed an underlying rhythm of: nsr   Medicines ordered and prescription drug management:  I ordered medication including ivfs  for hypercalcemia  Reevaluation of the patient after these medicines showed that the patient improved I have reviewed the patients home medicines and have made adjustments as needed   Test Considered:  ct   Critical Interventions:  Ivfs/mg   Consultations Obtained:  I requested consultation with the hospitalist (Dr.Krishnan) ,  and discussed lab and imaging findings as well as pertinent plan - he will admit   Problem List / ED Course:  Hypomagnesemia:  IV Mg ordered Hypercalcemia:  IVFs  ordered Recurrent right pleural effusion:  pulmonology and IR (8/24 and 8/26) has done thoracentesis in the past.  It looks like a PleurX catheter has been discussed, but pt has not decided that she wants this.  She may need another thoracentesis this admission.   Reevaluation:  After the interventions noted above, I reevaluated the patient and found that they have :improved   Social Determinants of Health:  Lives at home   Dispostion:  After consideration of the diagnostic results and the patients response to treatment, I feel that the patent would benefit from admission.       Final diagnoses:  Weakness  Hypercalcemia  Hypomagnesemia  Pleural effusion on right    ED Discharge Orders     None          Dean Clarity, MD 09/10/24 1818

## 2024-09-10 NOTE — Telephone Encounter (Signed)
 Called patient,patient is complaining of difficulty and a lack of energy and a lot of pain in the right lung most likely do to chemo offered an appointment for tomorrow to be seen and evaluated,technically declined due to the immense amount of pain she is in she lives on second floor and it is very difficult for her to leave the house wants input from doctor,told her to reach out to her oncologists in regards to pain from chemo,will route to [employer]

## 2024-09-10 NOTE — ED Triage Notes (Signed)
 Patient brought in by [facility] for weakness that she's been having since her chemo treatment on 09/04/24.

## 2024-09-10 NOTE — Telephone Encounter (Signed)
 Lindsey Williamson reports that she is unable to attend her lab appointment today. She states she is feeling weak and is experiencing significant pain. She also expresses concern that she may not be able to safely navigate the stairs. It is noted that she canceled her GCSF appointments twice last week as well as today's lab appointment. She was instructed to go to ED via EMS.

## 2024-09-10 NOTE — H&P (Signed)
 [facility] History and Physical  Lindsey Williamson FMW:[id number] DOB: 06-05-1951 DOA: 09/10/2024   PCP: [name], MD  Specialists: Dr. Sherrod is her medical oncologist.  Dr. Neda is her pulmonologist  Chief Complaint: Generalized weakness, some shortness of breath, dizziness  HPI: Lindsey Williamson is a 73 y.o. female with past medical history of right lung cancer, malignant pleural effusion, interstitial lung disease, COPD, diabetes mellitus type 2, coronary artery disease, ectopic atrial tachycardia, chronic hypoxic respiratory failure who was recently hospitalized in August and was discharged on August 30 after a stay for malignant pleural effusion in the setting of lung cancer.  She underwent thoracentesis on multiple occasions during that admission.  She also presented at that time with hemoptysis which was managed with tranexamic nebulizer treatments. It appears that her last thoracentesis was on 9//2025 and 725 cc of pleural fluid was removed.  She was doing well and went for her chemotherapy on 9/9.  She was given pembrolizumab , paclitaxel  and carboplatin .   After her chemotherapy on 9/9 patient has been feeling weak and fatigued.  She did have some loose stools but no profuse diarrhea.  Denies any abdominal pain.  Had some nausea but no vomiting.  No headaches.  Shortness of breath is a little bit worse than usual.  She has been feeling dizzy but did not have any passing out episodes.  She decided to come into the emergency department.  Currently she denies any pain issues.  Husband is at the bedside.  In the emergency department she was initially noted to be hypotensive with blood pressure in the 90s.  After fluid boluses this has improved.  Blood work shows hyponatremia, hypomagnesemia and hypercalcemia.  CBC is at baseline.  Chest x-ray shows reaccumulation of pleural effusion.  Home Medications: Prior to Admission medications   Medication Sig Start Date End Date  Taking? Authorizing Provider  albuterol  (VENTOLIN  HFA) 108 (90 Base) MCG/ACT inhaler Inhale 2 puffs into the lungs every 6 (six) hours as needed for wheezing or shortness of breath. 08/07/24   Neda Jennet LABOR, MD  aspirin  EC 81 MG tablet Take 81 mg by mouth daily. Swallow whole.    [provider]  bisoprolol  (ZEBETA ) 10 MG tablet Take 1 tablet (10 mg total) by mouth daily. 08/24/24   Patsy Lenis, MD  cetirizine  (ZYRTEC  ALLERGY ) 10 MG tablet Take 1 tablet (10 mg total) by mouth daily. 03/08/24   Lorin Norris, MD  diclofenac Sodium (VOLTAREN) 1 % GEL Apply 1 Application topically daily as needed (pain).    [provider]  diltiazem  (CARDIZEM  CD) 360 MG 24 hr capsule Take 1 capsule (360 mg total) by mouth daily. 08/24/24 08/24/25  Patsy Lenis, MD  empagliflozin  (JARDIANCE ) 25 MG TABS tablet Take 25 mg by mouth daily at 6 PM. 02/08/22   [provider]  ferrous sulfate  325 (65 FE) MG tablet Take 325 mg by mouth daily with breakfast.    [provider]  glipiZIDE  (GLUCOTROL  XL) 5 MG 24 hr tablet Take 1 tablet (5 mg total) by mouth daily with breakfast. 04/11/24   Amin, Ankit C, MD  guaiFENesin -codeine  100-10 MG/5ML syrup Take 5 mLs by mouth 3 (three) times daily as needed for cough. 08/24/24   Patsy Lenis, MD  insulin  aspart (NOVOLOG ) 100 UNIT/ML FlexPen Inject 0-11 Units into the skin 3 (three) times daily with meals. Check Blood Glucose (BG) and inject per scale: BG <150= 0 unit; BG 150-200= 1 unit; BG 201-250= 3 unit;  BG 251-300= 5 unit; BG 301-350= 7 unit; BG 351-400= 9 unit; BG >400= 11 unit and Call Primary Care. Patient taking differently: Inject 0-11 Units into the skin as needed for high blood sugar. Check Blood Glucose (BG) and inject per scale: BG <150= 0 unit; BG 150-200= 1 unit; BG 201-250= 3 unit; BG 251-300= 5 unit; BG 301-350= 7 unit; BG 351-400= 9 unit; BG >400= 11 unit and Call Primary Care. 04/11/24   Amin, Ankit C, MD  ipratropium-albuterol   (DUONEB) 0.5-2.5 (3) MG/3ML SOLN Take 3 mLs by nebulization in the morning and at bedtime. 05/14/24   Ruthell Lauraine FALCON, NP  lidocaine -prilocaine  (EMLA ) cream Apply to affected area once 07/23/24   Sherrod Sherrod, MD  metFORMIN  (GLUCOPHAGE -XR) 500 MG 24 hr tablet Take 1,000 mg by mouth in the morning and at bedtime. 02/08/22   [provider]  nystatin  (MYCOSTATIN ) 100000 UNIT/ML suspension Take 5 mLs (500,000 Units total) by mouth 4 (four) times daily. 08/28/24   Heilingoetter, Cassandra L, PA-C  ondansetron  (ZOFRAN ) 8 MG tablet Take 1 tablet (8 mg total) by mouth every 8 (eight) hours as needed for nausea or vomiting. Start on the third day after carboplatin . 07/23/24   Sherrod Sherrod, MD  polyethylene glycol (MIRALAX  / GLYCOLAX ) 17 g packet Take 17 g packet by mouth daily. 02/19/24   Adhikari, Amrit, MD  prochlorperazine  (COMPAZINE ) 10 MG tablet Take 1 tablet (10 mg total) by mouth every 6 (six) hours as needed. 07/17/24   Heilingoetter, Cassandra L, PA-C  umeclidinium-vilanterol (ANORO ELLIPTA ) 62.5-25 MCG/ACT AEPB Inhale 1 puff into the lungs daily. 05/09/24   Ruthell Lauraine FALCON, NP    Allergies:  Allergies  Allergen Reactions   Benzonatate Palpitations   Lisinopril Swelling and Other (See Comments)    Patient ended up in the ED with a swollen mouth and tongue   Aspirin  Nausea And Vomiting and Palpitations    Can tolerate baby aspirin  81 mg without difficulty but can't tolerate higher dosages   Azithromycin  Palpitations   Codeine  Palpitations    Patient states that this is not an allergy     Past Medical History: Past Medical History:  Diagnosis Date   Angioedema 10/17/2023   Arthritis    Diabetes mellitus without complication (HCC)    Hypertension     Past Surgical History:  Procedure Laterality Date   BRONCHIAL BIOPSY  03/30/2022   Procedure: BRONCHIAL BIOPSIES;  Surgeon: Brenna Adine CROME, DO;  Location: [facility] ENDOSCOPY;  Service: Pulmonary;;   BRONCHIAL BIOPSY  10/17/2023    Procedure: BRONCHIAL BIOPSIES;  Surgeon: Shelah Lamar RAMAN, MD;  Location: [facility]OSCOPY;  Service: Pulmonary;;   BRONCHIAL BRUSHINGS  03/30/2022   Procedure: BRONCHIAL BRUSHINGS;  Surgeon: Brenna Adine CROME, DO;  Location: [facility] ENDOSCOPY;  Service: Pulmonary;;   BRONCHIAL BRUSHINGS  10/17/2023   Procedure: BRONCHIAL BRUSHINGS;  Surgeon: Shelah Lamar RAMAN, MD;  Location: [facility] ENDOSCOPY;  Service: Pulmonary;;   BRONCHIAL NEEDLE ASPIRATION BIOPSY  03/30/2022   Procedure: BRONCHIAL NEEDLE ASPIRATION BIOPSIES;  Surgeon: Brenna Adine CROME, DO;  Location: [facility] ENDOSCOPY;  Service: Pulmonary;;   BRONCHIAL NEEDLE ASPIRATION BIOPSY  10/17/2023   Procedure: BRONCHIAL NEEDLE ASPIRATION BIOPSIES;  Surgeon: Shelah Lamar RAMAN, MD;  Location: [facility] ENDOSCOPY;  Service: Pulmonary;;   BRONCHIAL WASHINGS  05/19/2024   Procedure: IRRIGATION, BRONCHUS;  Surgeon: Jude Harden GAILS, MD;  Location: [facility] ENDOSCOPY;  Service: Cardiopulmonary;;   EYE SURGERY     FIDUCIAL MARKER PLACEMENT  03/30/2022   Procedure: FIDUCIAL MARKER PLACEMENT;  Surgeon: Brenna,  Adine CROME, DO;  Location: [facility] ENDOSCOPY;  Service: Pulmonary;;   FINE NEEDLE ASPIRATION  10/17/2023   Procedure: FINE NEEDLE ASPIRATION;  Surgeon: Shelah Lamar RAMAN, MD;  Location: [facility] ENDOSCOPY;  Service: Pulmonary;;   FOREIGN BODY REMOVAL  03/30/2022   Procedure: FOREIGN BODY REMOVAL;  Surgeon: Brenna Adine CROME, DO;  Location: [facility] ENDOSCOPY;  Service: Pulmonary;;   HEMOSTASIS CONTROL  10/17/2023   Procedure: HEMOSTASIS CONTROL;  Surgeon: Shelah Lamar RAMAN, MD;  Location: [facility] ENDOSCOPY;  Service: Pulmonary;;   IR THORACENTESIS ASP PLEURAL SPACE W/IMG GUIDE  08/21/2024   IR THORACENTESIS ASP PLEURAL SPACE W/IMG GUIDE  08/30/2024   VIDEO BRONCHOSCOPY Bilateral 05/19/2024   Procedure: VIDEO BRONCHOSCOPY WITHOUT FLUORO;  Surgeon: Jude Harden GAILS, MD;  Location: [facility] ENDOSCOPY;  Service: Cardiopulmonary;  Laterality: Bilateral;   VIDEO BRONCHOSCOPY WITH ENDOBRONCHIAL ULTRASOUND Bilateral 03/30/2022   Procedure: VIDEO  BRONCHOSCOPY WITH ENDOBRONCHIAL ULTRASOUND;  Surgeon: Brenna Adine CROME, DO;  Location: [facility] ENDOSCOPY;  Service: Pulmonary;  Laterality: Bilateral;   VIDEO BRONCHOSCOPY WITH ENDOBRONCHIAL ULTRASOUND Right 10/17/2023   Procedure: VIDEO BRONCHOSCOPY WITH ENDOBRONCHIAL ULTRASOUND;  Surgeon: Shelah Lamar RAMAN, MD;  Location: [facility] ENDOSCOPY;  Service: Pulmonary;  Laterality: Right;   VIDEO BRONCHOSCOPY WITH RADIAL ENDOBRONCHIAL ULTRASOUND  03/30/2022   Procedure: VIDEO BRONCHOSCOPY WITH RADIAL ENDOBRONCHIAL ULTRASOUND;  Surgeon: Brenna Adine CROME, DO;  Location: [facility] ENDOSCOPY;  Service: Pulmonary;;    Social History: Lives with her husband.  Not a current smoker.  No alcohol use.  Family History:  Family History  Problem Relation Age of Onset   Allergic rhinitis Sister    Asthma Sister    Angioedema Neg Hx    Atopy Neg Hx    Immunodeficiency Neg Hx    Urticaria Neg Hx    Eczema Neg Hx      Review of Systems - History obtained from the patient General ROS: positive for  - fatigue Psychological ROS: negative Ophthalmic ROS: negative ENT ROS: negative Allergy  and Immunology ROS: negative Hematological and Lymphatic ROS: negative Endocrine ROS: negative Respiratory ROS: As in HPI Cardiovascular ROS: As in HPI Gastrointestinal ROS: no abdominal pain, change in bowel habits, or black or bloody stools Genito-Urinary ROS: no dysuria, trouble voiding, or hematuria Musculoskeletal ROS: negative Neurological ROS: no TIA or stroke symptoms Dermatological ROS: negative  Physical Examination  Vitals:   09/10/24 1534 09/10/24 1537 09/10/24 1545 09/10/24 1655  BP:   107/61 (!) 109/58  Pulse:   79 71  Resp:   13 19  Temp:    (!) 97.5 F (36.4 C)  TempSrc:    Oral  SpO2: 98% 95% 95% 96%    BP (!) 109/58   Pulse 71   Temp (!) 97.5 F (36.4 C) (Oral)   Resp 19   SpO2 96%   General appearance: alert, cooperative, appears stated age, fatigued, and no distress Head: Normocephalic, without obvious  abnormality, atraumatic Eyes: conjunctivae/corneas clear. PERRL, EOM's intact. Throat: lips, mucosa, and tongue normal; teeth and gums normal Neck: no adenopathy, no carotid bruit, no JVD, supple, symmetrical, trachea midline, and thyroid  not enlarged, symmetric, no tenderness/mass/nodules Back: symmetric, no curvature. ROM normal. No CVA tenderness. Resp: Dullness to percussion right base.  Crackles bilateral bases.  No wheezing appreciated.  Mildly tachypneic.  No use of accessory muscles. Cardio: regular rate and rhythm, S1, S2 normal, no murmur, click, rub or gallop GI: soft, non-tender; bowel sounds normal; no masses,  no organomegaly Extremities: extremities normal, atraumatic, no cyanosis or edema Pulses: 2+ and  symmetric Skin: Skin color, texture, turgor normal. No rashes or lesions Lymph nodes: Cervical, supraclavicular, and axillary nodes normal. Neurologic: Alert and oriented x 3.  Cranial nerves II to XII intact.  Motor strength equal bilateral upper and lower extremities.  Gait not assessed.   Labs on Admission: I have personally reviewed following labs and imaging studies  CBC: Recent Labs  Lab 09/04/24 1052 09/10/24 1550  WBC 12.1* 7.2  NEUTROABS 10.3* 5.8  HGB 8.7* 9.4*  HCT 28.6* 33.2*  MCV 82.2 84.1  PLT 464* 497*   Basic Metabolic Panel: Recent Labs  Lab 09/04/24 1052 09/10/24 1550  NA 135 132*  K 4.1 4.1  CL 102 94*  CO2 25 29  GLUCOSE 194* 193*  BUN 8 20  CREATININE 0.51 0.53  CALCIUM  9.9 12.2*  MG  --  1.4*   GFR: Estimated Creatinine Clearance: 53.3 mL/min (by C-G formula based on SCr of 0.53 mg/dL). Liver Function Tests: Recent Labs  Lab 09/04/24 1052 09/10/24 1550  AST 8* 11*  ALT <5 <5  ALKPHOS 71 68  BILITOT 0.3 0.3  PROT 7.2 7.1  ALBUMIN 3.3* 3.5    BNP (last 3 results) Recent Labs    09/10/24 1550  PROBNP 467.0*    Radiological Exams on Admission: DG Chest Port 1 View Result Date: 09/10/2024 EXAM: 1 VIEW XRAY OF THE  CHEST 09/10/2024 04:25:34 PM COMPARISON: 08/30/2024 CLINICAL HISTORY: Patient brought in by [facility] for weakness that she's been having since her chemo treatment on 09/04/24. FINDINGS: LUNGS AND PLEURA: Persistent right basilar airspace opacity. Persistent diffuse interstitial prominence. Moderate right pleural effusion with right basilar atelectasis. HEART AND MEDIASTINUM: Atherosclerotic plaque. BONES AND SOFT TISSUES: No acute osseous abnormality. IMPRESSION: 1. Moderate right pleural effusion with right basilar opacities, increased from prior. 2. Similar appearance of interstitial edema. Electronically signed by: Donnice Mania MD 09/10/2024 04:46 PM EDT RP Workstation: [id number]    My interpretation of Electrocardiogram: Sinus rhythm in the 80s.  Normal axis.  Nonspecific ST and T wave changes.  Intervals are normal.   Problem List  Active Problems:   Primary cancer of right lower lobe of lung (HCC)   ILD (interstitial lung disease) (HCC)   Type 2 diabetes mellitus with microalbuminuria (HCC)   COPD (chronic obstructive pulmonary disease) (HCC)   Hypomagnesemia   Ectopic atrial tachycardia (HCC)   FTT (failure to thrive) in adult   Hypercalcemia   Hyponatremia   Normocytic anemia   Malignant pleural effusion   Assessment: This is a 73 year old African-American female with past medical history as stated earlier who comes in with generalized weakness fatigue after undergoing chemotherapy on 9/9.  She is noted to have electrolyte imbalance.  She is noted to be dehydrated.  Appears to be reaccumulating pleural effusion.  Plan: #1 Generalized weakness/failure to thrive: This is likely multifactorial including recent chemotherapy, pleural effusion, electrolyte imbalance and dehydration.  She was also mildly hypotensive initially.  She will be given IV fluids.  PT and OT eval.  Will check UA.  COVID-19 influenza and RSV PCR negative.  2.  Hyponatremia/hypomagnesemia/hypercalcemia: Will give her  normal saline infusion.  Supplement magnesium .  Hypercalcemia most likely is due to dehydration.  Calcium  level was 9.9 on 09/04/2024.  We will recheck calcium  tomorrow.  If there is no improvement we may have to consider bisphosphonates. Renal function is noted to be normal.  Potassium is 4.1.  3.  History of lung cancer with malignant pleural effusion/chronic hypoxic respiratory failure: Undergoing chemotherapy.  Last infusion was on 9/9.  WBC is normal.  She is afebrile.  No evidence for infection.  Her last thoracentesis was on 9/4 by interventional radiology.  She is followed by pulmonology.  Previously a PleurX catheter was discussed with her but she was hesitant.  Now willing to reconsider.  Will consult pulmonology in the morning.  She had hemoptysis during previous hospitalization which has resolved. On oxygen at 3 L by nasal cannula.  Respiratory status seems to be stable.  #4 Normocytic anemia: Hemoglobin seems to be close to baseline.  No evidence for any overt bleeding currently  #5. Oral candidiasis: She is on nystatin  swish and swallow, but may not have been compliant with it.  Denies any dysphagia.  Continue with nystatin .  Diflucan  for a few days.  QT interval noted to be normal.  #6. Ectopic atrial tachycardia: This was noted during previous hospitalization.  Continue bisoprolol  but will initiate from tomorrow.  #7. History of COPD/interstitial lung disease: Continue her home inhalers.  Respiratory status is stable  #8. Diabetes mellitus type 2: HbA1c 9.3 in August.  Noted to be on glipizide  and metformin  prior to admission.  Will initiate SSI.  Monitor CBGs.  Hold her oral agents for now.  #9. Essential hypertension: Low blood pressures noted initially.  Improved with IV fluid boluses.  Will hold her Cardizem  for now.    DVT Prophylaxis: Subcutaneous Lovenox  Code Status: DNR Family Communication: Discussed with patient and her husband Disposition: Hopefully return home when  improved Consults called: Will consult pulmonology in the morning Admission Status: Status is: Inpatient Remains inpatient appropriate because: Failure to thrive, recurrent malignant pleural effusion, electrolyte imbalances    Severity of Illness: The appropriate patient status for this patient is INPATIENT. Inpatient status is judged to be reasonable and necessary in order to provide the required intensity of service to ensure the patient's safety. The patient's presenting symptoms, physical exam findings, and initial radiographic and laboratory data in the context of their chronic comorbidities is felt to place them at high risk for further clinical deterioration. Furthermore, it is not anticipated that the patient will be medically stable for discharge from the hospital within 2 midnights of admission.   * I certify that at the point of admission it is my clinical judgment that the patient will require inpatient hospital care spanning beyond 2 midnights from the point of admission due to high intensity of service, high risk for further deterioration and high frequency of surveillance required.*   Further management decisions will depend on results of further testing and patient's response to treatment.   Florance Paolillo  [facility] Pager on [email or web address]  09/10/2024, 6:01 PM

## 2024-09-11 ENCOUNTER — Encounter: Payer: Self-pay | Admitting: Internal Medicine

## 2024-09-11 ENCOUNTER — Inpatient Hospital Stay (HOSPITAL_COMMUNITY)

## 2024-09-11 DIAGNOSIS — R531 Weakness: Principal | ICD-10-CM

## 2024-09-11 DIAGNOSIS — C3431 Malignant neoplasm of lower lobe, right bronchus or lung: Secondary | ICD-10-CM | POA: Diagnosis not present

## 2024-09-11 DIAGNOSIS — J90 Pleural effusion, not elsewhere classified: Secondary | ICD-10-CM | POA: Diagnosis not present

## 2024-09-11 DIAGNOSIS — D649 Anemia, unspecified: Secondary | ICD-10-CM | POA: Diagnosis not present

## 2024-09-11 DIAGNOSIS — J9611 Chronic respiratory failure with hypoxia: Secondary | ICD-10-CM

## 2024-09-11 DIAGNOSIS — R627 Adult failure to thrive: Secondary | ICD-10-CM | POA: Diagnosis not present

## 2024-09-11 DIAGNOSIS — J910 Malignant pleural effusion: Secondary | ICD-10-CM | POA: Diagnosis not present

## 2024-09-11 DIAGNOSIS — J449 Chronic obstructive pulmonary disease, unspecified: Secondary | ICD-10-CM

## 2024-09-11 LAB — GLUCOSE, CAPILLARY
Glucose-Capillary: 248 mg/dL — ABNORMAL HIGH (ref 70–99)
Glucose-Capillary: 289 mg/dL — ABNORMAL HIGH (ref 70–99)
Glucose-Capillary: 248 mg/dL — ABNORMAL HIGH (ref 70–99)
Glucose-Capillary: 218 mg/dL — ABNORMAL HIGH (ref 70–99)

## 2024-09-11 LAB — URINALYSIS, ROUTINE W REFLEX MICROSCOPIC
Bilirubin Urine: NEGATIVE
Glucose, UA: >=500 mg/dL — AB
Hgb urine dipstick: NEGATIVE
Ketones, ur: 5 mg/dL — AB
Nitrite: NEGATIVE
Protein, ur: 30 mg/dL — AB
Specific Gravity, Urine: 1.027 (ref 1.005–1.030)
pH: 5 (ref 5.0–8.0)

## 2024-09-11 LAB — ABO/RH: ABO/RH(D): O POS

## 2024-09-11 LAB — HEMOGLOBIN AND HEMATOCRIT, BLOOD
HCT: 34.2 % — ABNORMAL LOW (ref 36.0–46.0)
Hemoglobin: 9.7 g/dL — ABNORMAL LOW (ref 12.0–15.0)

## 2024-09-11 LAB — PROTEIN, TOTAL: Total Protein: 5.6 g/dL — ABNORMAL LOW (ref 6.5–8.1)

## 2024-09-11 LAB — COMPREHENSIVE METABOLIC PANEL WITH GFR
ALT: <5 U/L (ref 0–44)
AST: 10 U/L — ABNORMAL LOW (ref 15–41)
Albumin: 2.8 g/dL — ABNORMAL LOW (ref 3.5–5.0)
Alkaline Phosphatase: 56 U/L (ref 38–126)
Anion gap: 12 (ref 5–15)
BUN: 14 mg/dL (ref 8–23)
CO2: 22 mmol/L (ref 22–32)
Calcium: 10.4 mg/dL — ABNORMAL HIGH (ref 8.9–10.3)
Chloride: 98 mmol/L (ref 98–111)
Creatinine, Ser: 0.38 mg/dL — ABNORMAL LOW (ref 0.44–1.00)
GFR, Estimated: >60 mL/min (ref >60)
Glucose, Bld: 243 mg/dL — ABNORMAL HIGH (ref 70–99)
Potassium: 4 mmol/L (ref 3.5–5.1)
Sodium: 132 mmol/L — ABNORMAL LOW (ref 135–145)
Total Bilirubin: 0.2 mg/dL (ref 0.0–1.2)
Total Protein: 5.8 g/dL — ABNORMAL LOW (ref 6.5–8.1)

## 2024-09-11 LAB — PROTEIN, PLEURAL OR PERITONEAL FLUID: Total protein, fluid: 3.1 g/dL

## 2024-09-11 LAB — CBC
HCT: 27.6 % — ABNORMAL LOW (ref 36.0–46.0)
Hemoglobin: 7.7 g/dL — ABNORMAL LOW (ref 12.0–15.0)
MCH: 24.1 pg — ABNORMAL LOW (ref 26.0–34.0)
MCHC: 27.9 g/dL — ABNORMAL LOW (ref 30.0–36.0)
MCV: 86.3 fL (ref 80.0–100.0)
Platelets: 331 K/uL (ref 150–400)
RBC: 3.2 MIL/uL — ABNORMAL LOW (ref 3.87–5.11)
RDW: 17 % — ABNORMAL HIGH (ref 11.5–15.5)
WBC: 5 K/uL (ref 4.0–10.5)
nRBC: 0 % (ref 0.0–0.2)

## 2024-09-11 LAB — GLUCOSE, PLEURAL OR PERITONEAL FLUID: Glucose, Fluid: 290 mg/dL

## 2024-09-11 LAB — PREPARE RBC (CROSSMATCH)

## 2024-09-11 LAB — LACTATE DEHYDROGENASE: LDH: 187 U/L (ref 98–192)

## 2024-09-11 LAB — LACTATE DEHYDROGENASE, PLEURAL OR PERITONEAL FLUID: LD, Fluid: 115 U/L — ABNORMAL HIGH (ref 3–23)

## 2024-09-11 LAB — PROTIME-INR
INR: 0.9 (ref 0.8–1.2)
Prothrombin Time: 13.1 s (ref 11.4–15.2)

## 2024-09-11 LAB — MAGNESIUM: Magnesium: 1.9 mg/dL (ref 1.7–2.4)

## 2024-09-11 MED ORDER — FENTANYL CITRATE PF 50 MCG/ML IJ SOSY
12.5000 ug | PREFILLED_SYRINGE | Freq: Once | INTRAMUSCULAR | Status: AC
  Administered 2024-09-11: 12.5 ug via INTRAVENOUS
  Filled 2024-09-11: qty 1

## 2024-09-11 MED ORDER — GUAIFENESIN 100 MG/5ML PO LIQD
5.0000 mL | ORAL | Status: DC | PRN
  Administered 2024-09-11 – 2024-09-13 (×4): 5 mL via ORAL
  Filled 2024-09-11 (×4): qty 10

## 2024-09-11 MED ORDER — INSULIN GLARGINE 100 UNIT/ML ~~LOC~~ SOLN
5.0000 [IU] | Freq: Every day | SUBCUTANEOUS | Status: DC
  Administered 2024-09-11 – 2024-09-12 (×2): 5 [IU] via SUBCUTANEOUS
  Filled 2024-09-11 (×2): qty 0.05

## 2024-09-11 MED ORDER — SODIUM CHLORIDE 0.9 % IV SOLN
1.0000 g | INTRAVENOUS | Status: AC
  Administered 2024-09-11 – 2024-09-13 (×3): 1 g via INTRAVENOUS
  Filled 2024-09-11 (×3): qty 10

## 2024-09-11 MED ORDER — IPRATROPIUM-ALBUTEROL 0.5-2.5 (3) MG/3ML IN SOLN
3.0000 mL | Freq: Two times a day (BID) | RESPIRATORY_TRACT | Status: DC
Start: 2024-09-11 — End: 2024-09-14
  Administered 2024-09-11 – 2024-09-14 (×6): 3 mL via RESPIRATORY_TRACT
  Filled 2024-09-11 (×6): qty 3

## 2024-09-11 MED ORDER — SODIUM CHLORIDE 0.9% IV SOLUTION
Freq: Once | INTRAVENOUS | Status: AC
Start: 2024-09-11 — End: 2024-09-11

## 2024-09-11 MED ORDER — FENTANYL CITRATE PF 50 MCG/ML IJ SOSY: 25.0000 ug | PREFILLED_SYRINGE | Freq: Once | INTRAMUSCULAR | Status: DC

## 2024-09-11 MED ORDER — SODIUM CHLORIDE 0.9 % IV BOLUS
1000.0000 mL | Freq: Once | INTRAVENOUS | Status: AC
  Administered 2024-09-11: 1000 mL via INTRAVENOUS

## 2024-09-11 MED ORDER — SODIUM CHLORIDE 0.9 % IV SOLN: INTRAVENOUS | Status: AC

## 2024-09-11 NOTE — Procedures (Signed)
 Thoracentesis Procedure Note  ELLIANAH CORDY  [id number]  09-27-1951  Date:09/11/24  Time:10:48 AM   Provider Performing:Ayona Yniguez LOISE Blush   Procedure: Thoracentesis with imaging guidance ([id number])  Indication(s) Pleural Effusion  Consent Risks of the procedure as well as the alternatives and risks of each were explained to the patient and/or caregiver.  Consent for the procedure was obtained and is signed in the bedside chart  Anesthesia Topical only with 1% lidocaine     Time Out Verified patient identification, verified procedure, site/side was marked, verified correct patient position, special equipment/implants available, medications/allergies/relevant history reviewed, required imaging and test results available.   Sterile Technique Maximal sterile technique including full sterile barrier drape, hand hygiene, sterile gown, sterile gloves, mask, hair covering, sterile ultrasound probe cover (if used).  Procedure Description Ultrasound was used to identify appropriate pleural anatomy for placement and overlying skin marked.  Area of drainage cleaned and draped in sterile fashion. Lidocaine  was used to anesthetize the skin and subcutaneous tissue.  400 cc's of straw colored, translucent fluid was drained from the right pleural space. Catheter then removed and bandaid applied to site.   Complications/Tolerance None; patient tolerated the procedure well. Chest X-ray is ordered to confirm no post-procedural complication.   EBL Minimal   Specimen(s) Pleural fluid  Rexene LOISE Blush, PA-C [facility] 09/11/24 10:51 AM  Please see [email or web address] for pager details.  From 7A-7P if no response, please call [phone] After hours, please call ELink [phone]

## 2024-09-11 NOTE — Progress Notes (Signed)
 Lindsey Williamson   DOB:[birth year]   FM#:[id number]      ASSESSMENT & PLAN:  Lindsey Williamson is a 73 year old female patient with oncologic history significant for lung cancer, on chemotherapy.  She was admitted 9/15 with complaints of generalized weakness, shortness of breath, and dizziness.  Follows with Medical Oncology.  Non-small Cell Lung cancer, recurrent, stage IV -- Diagnosed in 2023 -- Status post concurrent chemo/RT.  Discontinued Imfinzi /immunotherapy after cycle 1 due to concern for pneumonitis. -- Current chemotherapy regimen: dose reduced Carbo +Taxol  +Keytruda  every 21 days. Initiated on 09/04/24.  -- Medical Oncology/Dr. Sherrod following and will make further treatment recommendations.    Shortness of breath COPD Exacerbation Pleural Effusion -- Admitted with complaints of SOB -- Xray 9/16 shows decreased moderate right pleural effusion.   -- Thoracentesis done today with 400 ml's straw colored fluid removed.  -- On home O2, continue here -- Monitor respiratory status  Anemia, normocytic -- Low HGB 7.7, decreased from 9.4 on admission.  Noted with PRBC transfusing well during assessment today.  -- Recommend PRBC transfusion for HGB <7.0 -- Continue to monitor CBC with differential  UTI Dysuria -- UA done, abnormal. -- On antibiotics, continue as ordered  Generalized weakness Failure to Thrive -- Likely secondary to recent oncologic therapy, malignancy and poor clinical status -- Continue supportive care including IV fluids as ordered. -- Continue PT/OT     Code Status DNR-Limited  Subjective:  Patient seen resting in bed with PRBC transfusing well, patient tolerating well.  Denies current pain.  Admits to shortness of breath although states better since thoracentesis earlier today.    She is weak-appearing.  No other acute complaints offered.  Objective:   Intake/Output Summary (Last 24 hours) at 09/11/2024 1117 Last data filed at 09/11/2024  1014 Gross per 24 hour  Intake 2180.52 ml  Output 100 ml  Net 2080.52 ml     PHYSICAL EXAMINATION: ECOG PERFORMANCE STATUS: 3 - Symptomatic, >50% confined to bed  Vitals:   09/11/24 1049 09/11/24 1112  BP: (!) 90/47 131/65  Pulse:    Resp: 20 (!) 24  Temp:    SpO2:     Filed Weights   09/10/24 2006  Weight: 142 lb 3.2 oz (64.5 kg)    GENERAL: alert, +weak-appearing SKIN: +pale skin color, texture, turgor are normal, no rashes or significant lesions EYES: normal, conjunctiva are pink and non-injected, sclera clear OROPHARYNX: no exudate, no erythema and lips, buccal mucosa, and tongue normal  NECK: supple, thyroid  normal size, non-tender, without nodularity LYMPH: no palpable lymphadenopathy in the cervical, axillary or inguinal LUNGS: clear to auscultation and percussion with normal breathing effort HEART: regular rate & rhythm and no murmurs and no lower extremity edema ABDOMEN: abdomen soft, non-tender and normal bowel sounds MUSCULOSKELETAL: no cyanosis of digits and no clubbing  PSYCH: alert & oriented x 3 with fluent speech NEURO: no focal motor/sensory deficits   All questions were answered. The patient knows to call the clinic with any problems, questions or concerns.   The total time spent in the appointment was 40 minutes encounter with patient including review of chart and various tests results, discussions about plan of care and coordination of care plan  Olam JINNY Brunner, NP 09/11/2024 11:17 AM    Labs Reviewed:  Lab Results  Component Value Date   WBC 5.0 09/11/2024   HGB 7.7 (L) 09/11/2024   HCT 27.6 (L) 09/11/2024   MCV 86.3 09/11/2024   PLT 331 09/11/2024  Recent Labs    09/04/24 1052 09/10/24 1550 09/11/24 0532  NA 135 132* 132*  K 4.1 4.1 4.0  CL 102 94* 98  CO2 25 29 22   GLUCOSE 194* 193* 243*  BUN 8 20 14   CREATININE 0.51 0.53 0.38*  CALCIUM  9.9 12.2* 10.4*  GFRNONAA >60 >60 >60  PROT 7.2 7.1 5.8*  ALBUMIN 3.3* 3.5 2.8*  AST 8*  11* 10*  ALT <5 <5 <5  ALKPHOS 71 68 56  BILITOT 0.3 0.3 0.2  BILIDIR  --  0.2  --   IBILI  --  0.2*  --     Studies Reviewed:  DG Chest Port 1 View Result Date: 09/11/2024 CLINICAL DATA:  Status post right thoracentesis EXAM: PORTABLE CHEST 1 VIEW COMPARISON:  Chest radiograph dated 09/10/2024 FINDINGS: Low lung volumes. Similar diffuse interstitial and dense right basilar opacity. Decreased moderate right pleural effusion. No definite pneumothorax. Right heart border is obscured. No acute osseous abnormality. IMPRESSION: 1. Decreased moderate right pleural effusion. No definite pneumothorax. 2. Similar diffuse interstitial and dense right basilar opacity. Electronically Signed   By: Limin  Xu M.D.   On: 09/11/2024 11:14   DG Chest Port 1 View Result Date: 09/10/2024 EXAM: 1 VIEW XRAY OF THE CHEST 09/10/2024 04:25:34 PM COMPARISON: 08/30/2024 CLINICAL HISTORY: Patient brought in by [facility] for weakness that she's been having since her chemo treatment on 09/04/24. FINDINGS: LUNGS AND PLEURA: Persistent right basilar airspace opacity. Persistent diffuse interstitial prominence. Moderate right pleural effusion with right basilar atelectasis. HEART AND MEDIASTINUM: Atherosclerotic plaque. BONES AND SOFT TISSUES: No acute osseous abnormality. IMPRESSION: 1. Moderate right pleural effusion with right basilar opacities, increased from prior. 2. Similar appearance of interstitial edema. Electronically signed by: Donnice Mania MD 09/10/2024 04:46 PM EDT RP Workstation: [id number]   IR THORACENTESIS ASP PLEURAL SPACE W/IMG GUIDE Result Date: 08/30/2024 INDICATION: Patient with history of lung cancer, dyspnea, recurrent right pleural effusion; request received for diagnostic and therapeutic right thoracentesis EXAM: ULTRASOUND GUIDED DIAGNOSTIC AND THERAPEUTIC RIGHT  THORACENTESIS MEDICATIONS: 8 ml 1% lidocaine  with epinephrine  to skin/subcutaneous tissue COMPLICATIONS: None immediate. PROCEDURE: An ultrasound  guided thoracentesis was thoroughly discussed with the patient and questions answered. The benefits, risks, alternatives and complications were also discussed. The patient understands and wishes to proceed with the procedure. Written consent was obtained. Ultrasound was performed to localize and mark an adequate pocket of fluid in the right chest. The area was then prepped and draped in the normal sterile fashion. 1% Lidocaine  was used for local anesthesia. Under ultrasound guidance a 6 Fr Safe-T-Centesis catheter was introduced. Thoracentesis was performed. The catheter was removed and a dressing applied. FINDINGS: A total of approximately 725 cc of slightly hazy, yellow fluid was removed. Samples were sent to the laboratory as requested by the clinical team. IMPRESSION: Successful ultrasound guided diagnostic and therapeutic right thoracentesis yielding 725 cc of pleural fluid. Performed by: Franky Rakers, PA-C Electronically Signed   By: Juliene Balder M.D.   On: 08/30/2024 15:36   DG Chest 1 View Result Date: 08/30/2024 CLINICAL DATA:  Post right thoracentesis EXAM: CHEST  1 VIEW COMPARISON:  08/28/2024 FINDINGS: Moderate right pleural effusion again noted, slightly decreased following thoracentesis. No pneumothorax. Diffuse interstitial prominence throughout the lungs is similar prior study. Continued right lower lobe airspace opacity, favor atelectasis although pneumonia cannot be excluded. Mild cardiomegaly. Aortic atherosclerosis. No acute bony abnormality. IMPRESSION: Slight decrease in the size of the moderate right pleural effusion following thoracentesis. No pneumothorax. Continued probable interstitial  edema. Right lower lobe atelectasis or pneumonia. Electronically Signed   By: Franky Crease M.D.   On: 08/30/2024 11:44   DG Chest 2 View Result Date: 08/28/2024 CLINICAL DATA:  Right pleural effusion. History of right lung cancer. EXAM: CHEST - 2 VIEW COMPARISON:  August 22, 2024 FINDINGS: Stable  cardiomediastinal silhouette. Moderate size right pleural effusion is noted. Mild reticular densities are noted throughout both lungs concerning for pulmonary edema. Bony thorax is unremarkable. IMPRESSION: Moderate size right pleural effusion. Probable mild pulmonary edema. Electronically Signed   By: Lynwood Landy Raddle M.D.   On: 08/28/2024 15:48   DG CHEST PORT 1 VIEW Result Date: 08/22/2024 CLINICAL DATA:  Respiratory failure EXAM: PORTABLE CHEST 1 VIEW COMPARISON:  08/21/2024 FINDINGS: Large right pleural effusion again noted. Continued right lower lobe airspace disease. Improving right upper lobe airspace disease. Patchy airspace disease throughout the left lung, stable. Heart likely mildly enlarged. Aortic atherosclerosis. IMPRESSION: Large right pleural effusion with some improvement in the right upper lobe consolidation. Otherwise no change. Electronically Signed   By: Franky Crease M.D.   On: 08/22/2024 14:28   DG Chest Port 1 View Result Date: 08/21/2024 CLINICAL DATA:  Status post right thoracentesis yielding 750 cc, known lung cancer EXAM: PORTABLE CHEST 1 VIEW COMPARISON:  08/20/2024, 08/17/2019 FINDINGS: Minimal improvement in the right effusion following thoracentesis. Residual right effusion remains. Similar diffuse mixed interstitial and airspace opacities with right lower lung collapse/consolidation. No significant left effusion. No pneumothorax. Known right lower lobe central obstructing mass is obscured and better demonstrated by comparison PET-CT. No acute osseous finding. IMPRESSION: 1. Minimal improvement in the right effusion following thoracentesis. No pneumothorax. 2. Similar diffuse mixed interstitial and airspace opacities with right lower lung collapse/consolidation. Electronically Signed   By: CHRISTELLA.  Shick M.D.   On: 08/21/2024 17:28   IR THORACENTESIS ASP PLEURAL SPACE W/IMG GUIDE Result Date: 08/21/2024 INDICATION: Patient with history of lung cancer, dyspnea, recurrent right  pleural effusion. Request received for therapeutic right thoracentesis. EXAM: ULTRASOUND GUIDED THERAPEUTIC RIGHT THORACENTESIS MEDICATIONS: 8 mL 1% lidocaine  with epinephrine  COMPLICATIONS: None immediate. PROCEDURE: An ultrasound guided thoracentesis was thoroughly discussed with the patient and questions answered. The benefits, risks, alternatives and complications were also discussed. The patient understands and wishes to proceed with the procedure. Written consent was obtained. Ultrasound was performed to localize and mark an adequate pocket of fluid in the right chest. The area was then prepped and draped in the normal sterile fashion. 1% Lidocaine  was used for local anesthesia. Under ultrasound guidance a 6 Fr Safe-T-Centesis catheter was introduced. Thoracentesis was performed. The catheter was removed and a dressing applied. FINDINGS: A total of approximately 750 cc of slightly hazy, yellow fluid was removed. IMPRESSION: Successful ultrasound guided therapeutic right thoracentesis yielding 750 cc of pleural fluid. Performed by: Franky Rakers, PA-C Electronically Signed   By: CHRISTELLA.  Shick M.D.   On: 08/21/2024 16:21   DG CHEST PORT 1 VIEW Result Date: 08/20/2024 CLINICAL DATA:  Follow-up right pleural effusion. EXAM: PORTABLE CHEST 1 VIEW COMPARISON:  08/19/2024 and chest CTA dated 08/16/2024. FINDINGS: The heart size can not be assessed due to obscuration of the right heart border. No significant change in confluence and patchy increased density in the right mid and lower lung zones. No significant change in chronic bilateral interstitial lung disease and right lung bullous changes. Stable laterally tracking pleural fluid on the right. Tortuous and partially calcified thoracic aorta. Unremarkable bones. IMPRESSION: 1. No significant change in right mid and  lower lung zone pneumonia/atelectasis and right pleural effusion. 2. Stable chronic bilateral interstitial lung disease and right lung bullous changes.  Electronically Signed   By: Elspeth Bathe M.D.   On: 08/20/2024 10:49   DG CHEST PORT 1 VIEW Result Date: 08/19/2024 CLINICAL DATA:  Shortness of breath. EXAM: PORTABLE CHEST 1 VIEW COMPARISON:  08/18/2024. FINDINGS: The cardio pericardial silhouette is enlarged. Right base collapse/consolidation with moderate right effusion is similar. Background diffuse interstitial opacity is similar. No left pleural effusion. Bones are diffusely demineralized. Telemetry leads overlie the chest. IMPRESSION: No substantial interval change. Right base collapse/consolidation with moderate right effusion. Electronically Signed   By: Camellia Candle M.D.   On: 08/19/2024 09:17   DG CHEST PORT 1 VIEW Result Date: 08/18/2024 CLINICAL DATA:  Pleural effusion.  Lung carcinoma.  Thoracentesis. EXAM: PORTABLE CHEST 1 VIEW COMPARISON:  05/18/2024 FINDINGS: Moderate right pleural effusion shows increase in size since prior exam. Increased right lower lung atelectasis. No pneumothorax visualized. Chronic interstitial lung disease again noted. Stable heart size. IMPRESSION: Increased size of moderate right pleural effusion and right lower lung atelectasis. No pneumothorax visualized. Chronic interstitial lung disease. Electronically Signed   By: Norleen DELENA Kil M.D.   On: 08/18/2024 11:57   CT Angio Chest PE W and/or Wo Contrast Result Date: 08/16/2024 CLINICAL DATA:  Shortness of breath, hemoptysis. History of lung cancer. EXAM: CT ANGIOGRAPHY CHEST WITH CONTRAST TECHNIQUE: Multidetector CT imaging of the chest was performed using the standard protocol during bolus administration of intravenous contrast. Multiplanar CT image reconstructions and MIPs were obtained to evaluate the vascular anatomy. RADIATION DOSE REDUCTION: This exam was performed according to the departmental dose-optimization program which includes automated exposure control, adjustment of the mA and/or kV according to patient size and/or use of iterative reconstruction  technique. CONTRAST:  75mL OMNIPAQUE  IOHEXOL  350 MG/ML SOLN COMPARISON:  July 16, 2024.  June 28, 2024. FINDINGS: Cardiovascular: Satisfactory opacification of the pulmonary arteries to the segmental level. No evidence of pulmonary embolism. Mild cardiomegaly. No pericardial effusion. Mediastinum/Nodes: Thyroid  gland is unremarkable. The esophagus is unremarkable. Extensive adenopathy is noted in the subcarinal and right hilar regions as noted on recent PET scan. This mass appears to extend into and occlude the right lower lobe bronchus proximally. Lungs/Pleura: No pneumothorax is noted. Emphysematous disease is noted. 3.7 cm mass noted posteriorly in left lower lobe consistent with metastatic disease. Moderate size right pleural effusion is noted with adjacent atelectasis or infiltrate of residual right upper lobe. Upper Abdomen: No acute abnormality. Musculoskeletal: No chest wall abnormality. No acute or significant osseous findings. Review of the MIP images confirms the above findings. IMPRESSION: 1. No definite evidence of pulmonary embolus. 2. Extensive subcarinal and right hilar adenopathy is noted as noted on recent PET scan. This mass appears to extend into and occlude the right lower lobe bronchus proximally. 3. Moderate size right pleural effusion is noted with adjacent atelectasis or infiltrate of residual right upper lobe. 4. 3.7 cm mass noted posteriorly in left lower lobe consistent with metastatic disease. 5. Emphysema. Aortic Atherosclerosis (ICD10-I70.0) and Emphysema (ICD10-J43.9). Electronically Signed   By: Lynwood Landy Raddle M.D.   On: 08/16/2024 17:30

## 2024-09-11 NOTE — Progress Notes (Signed)
 PT Cancellation Note  Patient Details Name: Lindsey Williamson MRN: [id number] DOB: 1951/10/08   Cancelled Treatment:    Reason Eval/Treat Not Completed: Patient at procedure or test/unavailable Had Thoracentesis, not getting blood.  Darice Potters PT Acute Rehabilitation Services Office [phone]'   Potters Darice Norris 09/11/2024, 11:56 AM

## 2024-09-11 NOTE — Progress Notes (Signed)
 OT Cancellation Note  Patient Details Name: Lindsey Williamson MRN: [id number] DOB: [birth year]   Cancelled Treatment:    Reason Eval/Treat Not Completed: Patient at procedure or test/ unavailable per nursing, undergoing bedside thoracentesis. Will follow up later for OT eval as schedule permits.  Mliss Fish 09/11/2024, 10:20 AM

## 2024-09-11 NOTE — Consult Note (Signed)
 NAME:  Lindsey Williamson, MRN:  [id number], DOB:  [birth year], LOS: 1 ADMISSION DATE:  09/10/2024, CONSULTATION DATE:  09/11/24 REFERRING MD:  Dr. 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    Presented 9/15 with weakness, dizziness, and lightheaded, no syncope since receiving chemo (pembrolizumab , paclitaxel , and carboplatin ) on 9/9.  Denies any recent fever, N/V/D, hemoptysis, or worsening SOB beyond her baseline.  Cough is dry and expresses she has ongoing exertional dyspnea, difficult for her as she lives on 2nd floor.  Reports 10lb weight loss.  She is followed in our clinic by Dr. Neda.  She was admitted to [facility] for dehydration and electrolyte derangements and mild hypotension improved with fluids.  CXR showed re-accumulation with moderate right sided pleural effusion with right basilar opacities. She remains on her baseline 3L [place].  Pulmonary consulted for further recommendation of pleural effusion.  She was recently hospitalized 8/21- 8/29 and treated for progressive dyspnea, hemoptysis treated with TXA nebs, and recurrent pleural effusion.  She has undergone right sided thoracentesis 05/19/24 with cytology suspicious for malignant cells, however repeat thoracentesis on 8/23 ( drained, exudate, neg cytology and culture), 8/26 IR thora ( drained), and 9/4 IR thora ( with neg cytology).   Pertinent  Medical History  Chronic hypoxic respiratory failure- 3L, COPD, DMT2, CAD, ectopic atrial tachycardia   Significant Hospital Events: Including procedures, antibiotic start and stop dates in addition to other pertinent events   9/15 admitted 9/16 R thoracentesis  Interim History / Subjective:    Objective    Blood pressure 119/61, pulse 82, temperature 98.3 F (36.8 C), temperature source Oral, resp. rate 16, height 5' 4 (1.626 m), weight 64.5 kg, SpO2 98%.        Intake/Output Summary (Last 24 hours) at 09/11/2024 0914 Last data filed at 09/11/2024 0610 Gross per 24 hour  Intake 1980.52 ml  Output 100 ml  Net 1880.52 ml   Filed Weights   09/10/24 2006  Weight: 64.5 kg   Examination: General:  Frail appearing older female lying in bed in NAD HEENT: MM pink/moist Neuro:  Aox4, MAE CV: rr PULM:  non labored, clear anteriorly, diminished right base posteriorly, few left posterior insp rales GI: soft, bs+ Extremities: warm/dry, no tibial edema  Skin: no rashes     Resolved problem list   Assessment and Plan  Stage IIIa NSCL/ squamous cell lung cancer diagnosed October 2024 Initial cancer was adenocarcinoma right upper lobe for which she had radiation treatment in 2023.  Followed by Dr. Sherrod, currently on palliative chemotherapy, completed cycle 2 on 9/8 of pembrolizumab , paclitaxel , and carboplatin . Recurrent right pleural effusion  S/p prior thoracentesis 5/24, 8/23, 8/26, and 9/4 - right pleural space examined with bedside US .  After discussing with pt, she is a poor candidate for pleurx intervention at this time.  Pt expresses doubt that she or her husband would be able to care and manage it at home and pain from the catheter is a concern for her. This will need to be addressed if reconsidered in the future.  Agreeable to proceed with bedside thoracentesis.  See separate procedure note> 400 ml of straw colored fluid drained.   - Will send fluid for pleural studies and cytology as prior  fluid not confirmed to be malignant.  Post procedure CXR neg for ptx, decreased pleural size, unchanged right basilar opacity - oncology following  COPD/ emphysema, no acute exacerbation Chronic hypoxic respiratory failure - stable on her baseline HOT 3L  - cont LABA/ LAMA/  ICS, prn SABA - pulm hygiene   Remainder per [facility].  Pulmonary will continue to follow, see again when pleural studies returned.  Please reach out sooner if needed.   Labs   CBC: Recent Labs  Lab 09/04/24 1052 09/10/24 1550 09/11/24 0532  WBC 12.1* 7.2 5.0  NEUTROABS 10.3* 5.8  --   HGB 8.7* 9.4* 7.7*  HCT 28.6* 33.2* 27.6*  MCV 82.2 84.1 86.3  PLT 464* 497* 331    Basic Metabolic Panel: Recent Labs  Lab 09/04/24 1052 09/10/24 1550 09/11/24 0532  NA 135 132* 132*  K 4.1 4.1 4.0  CL 102 94* 98  CO2 25 29 22   GLUCOSE 194* 193* 243*  BUN 8 20 14   CREATININE 0.51 0.53 0.38*  CALCIUM  9.9 12.2* 10.4*  MG  --  1.4* 1.9   GFR: Estimated Creatinine Clearance: 53.3 mL/min (A) (by C-G formula based on SCr of 0.38 mg/dL (L)). Recent Labs  Lab 09/04/24 1052 09/10/24 1550 09/11/24 0532  WBC 12.1* 7.2 5.0    Liver Function Tests: Recent Labs  Lab 09/04/24 1052 09/10/24 1550 09/11/24 0532  AST 8* 11* 10*  ALT <5 <5 <5  ALKPHOS 71 68 56  BILITOT 0.3 0.3 0.2  PROT 7.2 7.1 5.8*  ALBUMIN 3.3* 3.5 2.8*   No results for input(s): LIPASE, AMYLASE in the last 168 hours. No results for input(s): AMMONIA in the last 168 hours.  ABG    Component Value Date/Time   HCO3 19.8 (L) 08/16/2024 1612   ACIDBASEDEF 4.0 (H) 08/16/2024 1612   O2SAT 36.5 08/16/2024 1612     Coagulation Profile: Recent Labs  Lab 09/11/24 0532  INR 0.9    Cardiac Enzymes: No results for input(s): CKTOTAL, CKMB, CKMBINDEX, TROPONINI in the last 168 hours.  HbA1C: Hgb A1c MFr Bld  Date/Time Value Ref Range Status  08/17/2024 04:21 AM 9.3 (H) 4.8 - 5.6 % Final    Comment:    (NOTE) Diagnosis of Diabetes The following HbA1c ranges recommended by the American Diabetes Association (ADA) may be used as an aid in the diagnosis of diabetes mellitus.  Hemoglobin             Suggested A1C NGSP%              Diagnosis  <5.7                   Non Diabetic  5.7-6.4                 Pre-Diabetic  >6.4                   Diabetic  <7.0                   Glycemic control for                       adults with diabetes.    02/16/2024 05:59 AM 7.4 (H) 4.8 - 5.6 % Final    Comment:    (NOTE) Pre diabetes:          5.7%-6.4%  Diabetes:              >6.4%  Glycemic  control for   <7.0% adults with diabetes     CBG: Recent Labs  Lab 09/10/24 2209 09/11/24 0738  GLUCAP 229* 248*    Review of Systems:   Review of Systems  Constitutional:  Positive for malaise/fatigue and weight loss. Negative for chills and fever.  Respiratory:  Positive for cough. Negative for hemoptysis, sputum production and wheezing.        Chronic SOB  Cardiovascular:  Negative for chest pain and leg swelling.  Gastrointestinal:  Negative for diarrhea, nausea and vomiting.  Neurological:  Positive for dizziness and weakness. Negative for loss of consciousness.   Past Medical History:  She,  has a past medical history of Angioedema (10/17/2023), Arthritis, Diabetes mellitus without complication (HCC), and Hypertension.   Surgical History:   Past Surgical History:  Procedure Laterality Date   BRONCHIAL BIOPSY  03/30/2022   Procedure: BRONCHIAL BIOPSIES;  Surgeon: Brenna Adine CROME, DO;  Location: [facility] ENDOSCOPY;  Service: Pulmonary;;   BRONCHIAL BIOPSY  10/17/2023   Procedure: BRONCHIAL BIOPSIES;  Surgeon: Shelah Lamar RAMAN, MD;  Location[facility] ENDOSCOPY;  Service: Pulmonary;;   BRONCHIAL BRUSHINGS  03/30/2022   Procedure: BRONCHIAL BRUSHINGS;  Surgeon: Brenna Adine CROME, DO;  Location: [facility] ENDOSCOPY;  Service: Pulmonary;;   BRONCHIAL BRUSHINGS  10/17/2023   Procedure: BRONCHIAL BRUSHINGS;  Surgeon: Shelah Lamar RAMAN, MD;  Location[facility] ENDOSCOPY;  Service: Pulmonary;;   BRONCHIAL NEEDLE ASPIRATION BIOPSY  03/30/2022   Procedure: BRONCHIAL NEEDLE ASPIRATION BIOPSIES;  Surgeon: Brenna Adine CROME, DO;  Location: [facility] ENDOSCOPY;  Service: Pulmonary;;   BRONCHIAL NEEDLE ASPIRATION BIOPSY  10/17/2023    Procedure: BRONCHIAL NEEDLE ASPIRATION BIOPSIES;  Surgeon: Shelah Lamar RAMAN, MD;  Location: [facility] ENDOSCOPY;  Service: Pulmonary;;   BRONCHIAL WASHINGS  05/19/2024   Procedure: IRRIGATION, BRONCHUS;  Surgeon: Jude Harden GAILS, MD;  Location: [facility] ENDOSCOPY;  Service: Cardiopulmonary;;   EYE SURGERY     FIDUCIAL MARKER PLACEMENT  03/30/2022   Procedure: FIDUCIAL MARKER PLACEMENT;  Surgeon: Brenna Adine CROME, DO;  Location: [facility] ENDOSCOPY;  Service: Pulmonary;;   FINE NEEDLE ASPIRATION  10/17/2023   Procedure: FINE NEEDLE ASPIRATION;  Surgeon: Shelah Lamar RAMAN, MD;  Location: [facility] ENDOSCOPY;  Service: Pulmonary;;   FOREIGN BODY REMOVAL  03/30/2022   Procedure: FOREIGN BODY REMOVAL;  Surgeon: Brenna Adine CROME, DO;  Location: [facility] ENDOSCOPY;  Service: Pulmonary;;   HEMOSTASIS CONTROL  10/17/2023   Procedure: HEMOSTASIS CONTROL;  Surgeon: Shelah Lamar RAMAN, MD;  Location: [facility] ENDOSCOPY;  Service: Pulmonary;;   IR THORACENTESIS ASP PLEURAL SPACE W/IMG GUIDE  08/21/2024   IR THORACENTESIS ASP PLEURAL SPACE W/IMG GUIDE  08/30/2024   VIDEO BRONCHOSCOPY Bilateral 05/19/2024   Procedure: VIDEO BRONCHOSCOPY WITHOUT FLUORO;  Surgeon: Jude Harden GAILS, MD;  Location: [facility] ENDOSCOPY;  Service: Cardiopulmonary;  Laterality: Bilateral;   VIDEO BRONCHOSCOPY WITH ENDOBRONCHIAL ULTRASOUND Bilateral 03/30/2022   Procedure: VIDEO BRONCHOSCOPY WITH ENDOBRONCHIAL ULTRASOUND;  Surgeon: Brenna Adine CROME, DO;  Location: [facility] ENDOSCOPY;  Service: Pulmonary;  Laterality: Bilateral;   VIDEO BRONCHOSCOPY WITH ENDOBRONCHIAL ULTRASOUND Right 10/17/2023   Procedure: VIDEO BRONCHOSCOPY WITH ENDOBRONCHIAL ULTRASOUND;  Surgeon: Shelah Lamar RAMAN, MD;  Location[facility] ENDOSCOPY;  Service: Pulmonary;  Laterality: Right;   VIDEO BRONCHOSCOPY WITH RADIAL ENDOBRONCHIAL ULTRASOUND  03/30/2022   Procedure: VIDEO BRONCHOSCOPY WITH RADIAL ENDOBRONCHIAL ULTRASOUND;  Surgeon: Brenna Adine CROME, DO;  Location: [facility] ENDOSCOPY;  Service: Pulmonary;;     Social History:   reports that  she quit smoking about 20 months ago. Her smoking use included cigarettes. She has  been exposed to tobacco smoke. She has never used smokeless tobacco. She reports that she does not drink alcohol and does not use drugs.   Family History:  Her family history includes Allergic rhinitis in her sister; Asthma in her sister. There is no history of Angioedema, Atopy, Immunodeficiency, Urticaria, or Eczema.   Allergies Allergies  Allergen Reactions   Benzonatate Palpitations   Lisinopril Swelling and Other (See Comments)    Patient ended up in the ED with a swollen mouth and tongue   Aspirin  Nausea And Vomiting, Palpitations and Other (See Comments)    Can tolerate baby aspirin  81 mg without difficulty, but can't tolerate higher dosages   Azithromycin  Palpitations   Codeine  Palpitations and Other (See Comments)    Patient states this is not an allergy      Home Medications  Prior to Admission medications   Medication Sig Start Date End Date Taking? Authorizing Provider  aspirin  EC 81 MG tablet Take 81 mg by mouth daily. Swallow whole.   Yes [provider]  albuterol  (VENTOLIN  HFA) 108 (90 Base) MCG/ACT inhaler Inhale 2 puffs into the lungs every 6 (six) hours as needed for wheezing or shortness of breath. 08/07/24   Neda Jennet LABOR, MD  bisoprolol  (ZEBETA ) 10 MG tablet Take 1 tablet (10 mg total) by mouth daily. Patient not taking: Reported on 09/10/2024 08/24/24   Patsy Lenis, MD  cetirizine  (ZYRTEC  ALLERGY ) 10 MG tablet Take 1 tablet (10 mg total) by mouth daily. 03/08/24   Lorin Norris, MD  diclofenac Sodium (VOLTAREN) 1 % GEL Apply 1 Application topically daily as needed (pain).    [provider]  diltiazem  (CARDIZEM  CD) 360 MG 24 hr capsule Take 1 capsule (360 mg total) by mouth daily. 08/24/24 08/24/25  Patsy Lenis, MD  empagliflozin  (JARDIANCE ) 25 MG TABS tablet Take 25 mg by mouth daily at 6 PM. 02/08/22   [provider]  ferrous sulfate  325 (65 FE) MG  tablet Take 325 mg by mouth daily with breakfast.    [provider]  glipiZIDE  (GLUCOTROL  XL) 5 MG 24 hr tablet Take 1 tablet (5 mg total) by mouth daily with breakfast. 04/11/24   Amin, Ankit C, MD  guaiFENesin -codeine  100-10 MG/5ML syrup Take 5 mLs by mouth 3 (three) times daily as needed for cough. 08/24/24   Patsy Lenis, MD  insulin  aspart (NOVOLOG ) 100 UNIT/ML FlexPen Inject 0-11 Units into the skin 3 (three) times daily with meals. Check Blood Glucose (BG) and inject per scale: BG <150= 0 unit; BG 150-200= 1 unit; BG 201-250= 3 unit; BG 251-300= 5 unit; BG 301-350= 7 unit; BG 351-400= 9 unit; BG >400= 11 unit and Call Primary Care. Patient taking differently: Inject 0-11 Units into the skin as needed for high blood sugar. Check Blood Glucose (BG) and inject per scale: BG <150= 0 unit; BG 150-200= 1 unit; BG 201-250= 3 unit; BG 251-300= 5 unit; BG 301-350= 7 unit; BG 351-400= 9 unit; BG >400= 11 unit and Call Primary Care. 04/11/24   Amin, Ankit C, MD  ipratropium-albuterol  (DUONEB) 0.5-2.5 (3) MG/3ML SOLN Take 3 mLs by nebulization in the morning and at bedtime. 05/14/24   Ruthell Lauraine FALCON, NP  lidocaine -prilocaine  (EMLA ) cream Apply to affected area once 07/23/24   Sherrod Sherrod, MD  metFORMIN  (GLUCOPHAGE -XR) 500 MG 24 hr tablet Take 1,000 mg by mouth in the morning and at bedtime. 02/08/22   [provider]  nystatin  (MYCOSTATIN ) 100000 UNIT/ML suspension Take 5 mLs (500,000 Units total)  by mouth 4 (four) times daily. 08/28/24   Heilingoetter, Cassandra L, PA-C  ondansetron  (ZOFRAN ) 8 MG tablet Take 1 tablet (8 mg total) by mouth every 8 (eight) hours as needed for nausea or vomiting. Start on the third day after carboplatin . 07/23/24   Sherrod Sherrod, MD  polyethylene glycol (MIRALAX  / GLYCOLAX ) 17 g packet Take 17 g packet by mouth daily. 02/19/24   Jillian Buttery, MD  prochlorperazine  (COMPAZINE ) 10 MG tablet Take 1 tablet (10 mg total) by mouth every 6 (six) hours as needed.  07/17/24   Heilingoetter, Cassandra L, PA-C  umeclidinium-vilanterol (ANORO ELLIPTA ) 62.5-25 MCG/ACT AEPB Inhale 1 puff into the lungs daily. 05/09/24   Ruthell Lauraine FALCON, NP     Critical care time: n/a       Lyle Pesa, NP [facility] 09/11/2024, 12:13 PM  See Amion for pager If no response to pager , please call [phone] until 7pm After 7:00 pm call Elink  [phone]

## 2024-09-11 NOTE — Progress Notes (Addendum)
 [facility] PROGRESS NOTE   RANDYE TREICHLER FMW:[id number] DOB: 1951-01-30 DOA: 09/10/2024  PCP: [name], MD  Brief History: 73 y.o. 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  She underwent thoracentesis on multiple occasions during that admission.  She also presented at that time with hemoptysis which was managed with tranexamic nebulizer treatments. It appears that her last thoracentesis was on 08/30/2024 and 725 cc of pleural fluid was removed.  She was doing well and went for her chemotherapy on 9/9.  She was given pembrolizumab , paclitaxel  and carboplatin .   After her chemotherapy patient has been feeling weak and fatigued.  She had been feeling dizzy and lightheaded.  Denied any syncopal episodes.  She came back to the emergency department and was noted to be dehydrated with electrolyte imbalance.  Chest x-ray showed reaccumulation of pleural effusion.  Patient was hospitalized for further management.    Consultants: Pulmonology.  Medical oncology notified  Procedures: None yet    Subjective/Interval History: Patient still feels fatigued but slightly better compared to yesterday.  Denies any bleeding episodes.  Still has a cough which is dry.  No hemoptysis.  No nausea vomiting.    Assessment/Plan:   Generalized weakness/failure to thrive This is likely multifactorial including recent chemotherapy, pleural effusion, electrolyte imbalance and dehydration.  She was also mildly hypotensive initially.   Patient was given IV fluids.  Some improvement noted today.  Anemia could also be contributing. Await PT and OT eval. UA  is noted to be abnormal.  Patient does mention dysuria.  Will initiate antibiotics. COVID-19 influenza and RSV PCR negative.   Hyponatremia/hypomagnesemia/hypercalcemia Patient was given IV fluids with improvement in calcium  level.  Sodium level is stable.  There could be an element of SIADH.  Magnesium  level has improved to 1.9.  Continue to monitor calcium  levels on a daily basis.   Acute Urinary tract infection UA noted to be abnormal.  Patient complains of dysuria.  Will initiate ceftriaxone .  Urine cultures.  History of lung cancer/chronic hypoxic respiratory failure Undergoing chemotherapy.  Last infusion was on 9/9.   She is afebrile.  WBC is noted to be 5.0 today.  Neutrophil counts were 5800 yesterday.  Will recheck tomorrow.   Medical oncology notified via epic.   On home oxygen which is being continued.  Recurrent pleural effusion Presumed to be malignant.  Her last thoracentesis was on 9/4 by interventional radiology.  She is followed by pulmonology.  Previously a PleurX catheter was discussed with her but she was hesitant.  Now willing to reconsider.  Will consult pulmonology. She had hemoptysis during previous hospitalization which has resolved.    Normocytic anemia Drop in hemoglobin noted.  Most likely due to chemotherapy.  No overt blood loss noted.  She does have fatigue.  Could have symptomatic anemia.  Will transfuse 1 unit of PRBC.  She is agreeable to same.    Oral candidiasis She is on nystatin  swish and swallow, but may not have been compliant with it.  Denies any dysphagia.  Continue with nystatin .  Diflucan  for a few days.  QT interval noted to be normal.   Ectopic atrial tachycardia This was noted during previous hospitalization.  She  was seen by cardiology at that time.  Okay to resume bisoprolol  since blood pressures have improved.     History of COPD/interstitial lung disease Continue her home inhalers.  Respiratory status is stable   Diabetes mellitus  type 2 HbA1c 9.3 in August.  Noted to be on glipizide  and metformin  prior to admission.  Continue SSI.  Monitor CBGs.  Holding her oral agents.   CBGs noted to be in the 200s.  Will add long-acting insulin .   Essential hypertension Low blood pressures noted initially.  Improved with IV fluid boluses.  Continue to hold her Cardizem  for now.   DVT Prophylaxis: Lovenox  Code Status: DNR Family Communication: No family at bedside.  Discussed with patient Disposition Plan: Hopefully return home when improved  Status is: Inpatient Remains inpatient appropriate because: Failure to thrive, symptomatic anemia, electrolyte imbalance, recurrent pleural effusion      Medications: Scheduled:  bisoprolol   10 mg Oral Daily   enoxaparin  (LOVENOX ) injection  40 mg Subcutaneous Q24H   feeding supplement (GLUCERNA SHAKE)  237 mL Oral TID BM   fluconazole   100 mg Oral Daily   insulin  aspart  0-15 Units Subcutaneous TID WC   ipratropium-albuterol   3 mL Nebulization BID   nystatin   5 mL Oral QID   umeclidinium-vilanterol  1 puff Inhalation Daily   Continuous:  sodium chloride  100 mL/hr at 09/11/24 0610   PRN:acetaminophen  **OR** acetaminophen , oxyCODONE , prochlorperazine   Antibiotics: Anti-infectives (From admission, onward)    Start     Dose/Rate Route Frequency Ordered Stop   09/10/24 1815  fluconazole  (DIFLUCAN ) tablet 100 mg        100 mg Oral Daily 09/10/24 1801 09/15/24 0959       Objective:  Vital Signs  Vitals:   09/11/24 0005 09/11/24 0430 09/11/24 0741 09/11/24 0840  BP: 115/64 (!) 121/59 119/61   Pulse: 80 78 82   Resp: 18 20 16    Temp: 98 F (36.7 C) 98 F (36.7 C) 98.3 F (36.8 C)   TempSrc: Oral Oral Oral   SpO2: 100% 98% 99% 98%  Weight:      Height:        Intake/Output Summary (Last 24 hours) at 09/11/2024 0849 Last data filed at 09/11/2024 0610 Gross per 24 hour  Intake 1980.52 ml  Output 100 ml  Net 1880.52 ml   Filed Weights   09/10/24 2006  Weight:  64.5 kg    General appearance: Awake alert.  In no distress.  Fatigued Resp: Mildly tachypneic.  Dullness to percussion on the right.  Crackles at the bases.  No wheezing or rhonchi this morning. Cardio: S1-S2 is normal regular.  No S3-S4.  No rubs murmurs or bruit GI: Abdomen is soft.  Nontender nondistended.  Bowel sounds are present normal.  No masses organomegaly Extremities: No edema.  She is noted to be moving her extremities but physical deconditioning is noted.   Neurologic: Alert and oriented x3.  No focal neurological deficits.    Lab Results:  Data Reviewed: I have personally reviewed following labs and reports of the imaging studies  CBC: Recent Labs  Lab 09/04/24 1052 09/10/24 1550 09/11/24 0532  WBC 12.1* 7.2 5.0  NEUTROABS 10.3* 5.8  --   HGB 8.7* 9.4* 7.7*  HCT 28.6* 33.2* 27.6*  MCV 82.2 84.1 86.3  PLT 464* 497* 331    Basic Metabolic Panel: Recent Labs  Lab 09/04/24 1052 09/10/24 1550 09/11/24 0532  NA 135 132* 132*  K 4.1 4.1 4.0  CL 102 94* 98  CO2 25 29 22   GLUCOSE 194* 193* 243*  BUN 8 20 14   CREATININE 0.51 0.53 0.38*  CALCIUM  9.9 12.2* 10.4*  MG  --  1.4* 1.9    GFR: Estimated Creatinine Clearance: 53.3 mL/min (A) (by C-G formula based on SCr of 0.38 mg/dL (L)).  Liver Function Tests: Recent Labs  Lab 09/04/24 1052 09/10/24 1550 09/11/24 0532  AST 8* 11* 10*  ALT <5 <5 <5  ALKPHOS 71 68 56  BILITOT 0.3 0.3 0.2  PROT 7.2 7.1 5.8*  ALBUMIN 3.3* 3.5 2.8*    Coagulation Profile: Recent Labs  Lab 09/11/24 0532  INR 0.9   BNP (last 3 results) Recent Labs    09/10/24 1550  PROBNP 467.0*    CBG: Recent Labs  Lab 09/10/24 2209 09/11/24 0738  GLUCAP 229* 248*    Recent Results (from the past 240 hours)  Resp panel by RT-PCR (RSV, Flu A&B, Covid) Anterior Nasal Swab     Status: None   Collection Time: 09/10/24  3:36 PM   Specimen: Anterior Nasal Swab  Result Value Ref Range Status   SARS Coronavirus 2 by RT PCR  NEGATIVE NEGATIVE Final    Comment: (NOTE) SARS-CoV-2 target nucleic acids are NOT DETECTED.  The SARS-CoV-2 RNA is generally detectable in upper respiratory specimens during the acute phase of infection. The lowest concentration of SARS-CoV-2 viral copies this assay can detect is 138 copies/mL. A negative result does not preclude SARS-Cov-2 infection and should not be used as the sole basis for treatment or other patient management decisions. A negative result may occur with  improper specimen collection/handling, submission of specimen other than nasopharyngeal swab, presence of viral mutation(s) within the areas targeted by this assay, and inadequate number of viral copies(<138 copies/mL). A negative result must be combined with clinical observations, patient history, and epidemiological information. The expected result is Negative.  Fact Sheet for Patients:  BloggerCourse.com  Fact Sheet for Healthcare Providers:  SeriousBroker.it  This test is no t yet approved or cleared by the [place]  FDA and  has been authorized for detection and/or diagnosis of SARS-CoV-2 by FDA under an Emergency Use Authorization (EUA). This EUA will remain  in effect (meaning this test can be used) for the duration of the COVID-19 declaration under Section 564(b)(1) of the Act, 21 U.S.C.section 360bbb-3(b)(1), unless the authorization is terminated  or revoked sooner.       Influenza A by PCR NEGATIVE NEGATIVE Final   Influenza B by PCR NEGATIVE NEGATIVE Final    Comment: (NOTE) The Xpert Xpress SARS-CoV-2/FLU/RSV plus assay is intended as an aid in the diagnosis of influenza from Nasopharyngeal swab specimens and should not be used as a sole basis for treatment. Nasal washings and aspirates are unacceptable for Xpert Xpress SARS-CoV-2/FLU/RSV testing.  Fact Sheet for Patients: BloggerCourse.com  Fact Sheet for  Healthcare Providers: SeriousBroker.it  This test is not yet approved or cleared by the [place]  FDA and has been authorized for detection and/or diagnosis of SARS-CoV-2 by FDA under an Emergency Use Authorization (EUA). This EUA will remain in effect (meaning this test can be used) for the duration of the COVID-19 declaration under Section 564(b)(1) of the Act, 21 U.S.C. section 360bbb-3(b)(1), unless the authorization is terminated or revoked.     Resp Syncytial Virus by PCR NEGATIVE NEGATIVE Final    Comment: (NOTE) Fact Sheet for Patients: BloggerCourse.com  Fact Sheet for Healthcare Providers: SeriousBroker.it  This test is not yet  approved or cleared by the [place]  FDA and has been authorized for detection and/or diagnosis of SARS-CoV-2 by FDA under an Emergency Use Authorization (EUA). This EUA will remain in effect (meaning this test can be used) for the duration of the COVID-19 declaration under Section 564(b)(1) of the Act, 21 U.S.C. section 360bbb-3(b)(1), unless the authorization is terminated or revoked.  Performed at [facility], [address] [place], [place] [address]       Radiology Studies: DG Chest Port 1 View Result Date: 09/10/2024 EXAM: 1 VIEW XRAY OF THE CHEST 09/10/2024 04:25:34 PM COMPARISON: 08/30/2024 CLINICAL HISTORY: Patient brought in by [facility] for weakness that she's been having since her chemo treatment on 09/04/24. FINDINGS: LUNGS AND PLEURA: Persistent right basilar airspace opacity. Persistent diffuse interstitial prominence. Moderate right pleural effusion with right basilar atelectasis. HEART AND MEDIASTINUM: Atherosclerotic plaque. BONES AND SOFT TISSUES: No acute osseous abnormality. IMPRESSION: 1. Moderate right pleural effusion with right basilar opacities, increased from prior. 2. Similar appearance of interstitial edema. Electronically  signed by: Donnice Mania MD 09/10/2024 04:46 PM EDT RP Workstation: [id number]       LOS: 1 day   Joette Pebbles  [facility] Pager on [email or web address]  09/11/2024, 8:49 AM

## 2024-09-11 NOTE — Plan of Care (Signed)
  Problem: Education: Goal: Ability to describe self-care measures that may prevent or decrease complications (Diabetes Survival Skills Education) will improve Outcome: Progressing   Problem: Coping: Goal: Ability to adjust to condition or change in health will improve Outcome: Progressing   Problem: Fluid Volume: Goal: Ability to maintain a balanced intake and output will improve Outcome: Progressing   Problem: Metabolic: Goal: Ability to maintain appropriate glucose levels will improve Outcome: Progressing   Problem: Nutritional: Goal: Maintenance of adequate nutrition will improve Outcome: Progressing Goal: Progress toward achieving an optimal weight will improve Outcome: Progressing   

## 2024-09-12 ENCOUNTER — Inpatient Hospital Stay

## 2024-09-12 ENCOUNTER — Ambulatory Visit

## 2024-09-12 DIAGNOSIS — R627 Adult failure to thrive: Secondary | ICD-10-CM | POA: Diagnosis not present

## 2024-09-12 LAB — GLUCOSE, CAPILLARY
Glucose-Capillary: 257 mg/dL — ABNORMAL HIGH (ref 70–99)
Glucose-Capillary: 166 mg/dL — ABNORMAL HIGH (ref 70–99)
Glucose-Capillary: 226 mg/dL — ABNORMAL HIGH (ref 70–99)
Glucose-Capillary: 249 mg/dL — ABNORMAL HIGH (ref 70–99)

## 2024-09-12 LAB — CYTOLOGY - NON PAP

## 2024-09-12 LAB — CBC WITH DIFFERENTIAL/PLATELET
Abs Immature Granulocytes: 0.05 K/uL (ref 0.00–0.07)
Basophils Absolute: 0 K/uL (ref 0.0–0.1)
Basophils Relative: 0 %
Eosinophils Absolute: 0.2 K/uL (ref 0.0–0.5)
Eosinophils Relative: 4 %
HCT: 28 % — ABNORMAL LOW (ref 36.0–46.0)
Hemoglobin: 8.3 g/dL — ABNORMAL LOW (ref 12.0–15.0)
Immature Granulocytes: 1 %
Lymphocytes Relative: 11 %
Lymphs Abs: 0.6 K/uL — ABNORMAL LOW (ref 0.7–4.0)
MCH: 25.5 pg — ABNORMAL LOW (ref 26.0–34.0)
MCHC: 29.6 g/dL — ABNORMAL LOW (ref 30.0–36.0)
MCV: 86.2 fL (ref 80.0–100.0)
Monocytes Absolute: 0.4 K/uL (ref 0.1–1.0)
Monocytes Relative: 7 %
Neutro Abs: 4.4 K/uL (ref 1.7–7.7)
Neutrophils Relative %: 77 %
Platelets: 298 K/uL (ref 150–400)
RBC: 3.25 MIL/uL — ABNORMAL LOW (ref 3.87–5.11)
RDW: 16.7 % — ABNORMAL HIGH (ref 11.5–15.5)
WBC: 5.7 K/uL (ref 4.0–10.5)
nRBC: 0 % (ref 0.0–0.2)

## 2024-09-12 LAB — BASIC METABOLIC PANEL WITH GFR
Anion gap: 11 (ref 5–15)
BUN: 9 mg/dL (ref 8–23)
CO2: 20 mmol/L — ABNORMAL LOW (ref 22–32)
Calcium: 8.9 mg/dL (ref 8.9–10.3)
Chloride: 102 mmol/L (ref 98–111)
Creatinine, Ser: 0.37 mg/dL — ABNORMAL LOW (ref 0.44–1.00)
GFR, Estimated: >60 mL/min (ref >60)
Glucose, Bld: 265 mg/dL — ABNORMAL HIGH (ref 70–99)
Potassium: 3.9 mmol/L (ref 3.5–5.1)
Sodium: 134 mmol/L — ABNORMAL LOW (ref 135–145)

## 2024-09-12 LAB — URINE CULTURE

## 2024-09-12 LAB — TYPE AND SCREEN
ABO/RH(D): O POS
Antibody Screen: NEGATIVE
Unit division: 0

## 2024-09-12 LAB — BPAM RBC
Blood Product Expiration Date: 202510142359
ISSUE DATE / TIME: 202509161146
Unit Type and Rh: 5100

## 2024-09-12 LAB — MAGNESIUM: Magnesium: 1.4 mg/dL — ABNORMAL LOW (ref 1.7–2.4)

## 2024-09-12 MED ORDER — MAGNESIUM SULFATE 2 GM/50ML IV SOLN
2.0000 g | Freq: Once | INTRAVENOUS | Status: AC
  Administered 2024-09-12: 2 g via INTRAVENOUS
  Filled 2024-09-12: qty 50

## 2024-09-12 MED ORDER — INSULIN ASPART 100 UNIT/ML IJ SOLN
2.0000 [IU] | Freq: Three times a day (TID) | INTRAMUSCULAR | Status: DC
  Administered 2024-09-12 – 2024-09-14 (×7): 2 [IU] via SUBCUTANEOUS

## 2024-09-12 MED ORDER — INSULIN GLARGINE 100 UNIT/ML ~~LOC~~ SOLN
12.0000 [IU] | Freq: Every day | SUBCUTANEOUS | Status: DC
Start: 2024-09-13 — End: 2024-09-14
  Administered 2024-09-13 – 2024-09-14 (×2): 12 [IU] via SUBCUTANEOUS
  Filled 2024-09-12 (×2): qty 0.12

## 2024-09-12 NOTE — Progress Notes (Signed)
 PROGRESS NOTE  Lindsey Williamson  FMW:[id number] DOB: 17-May-1951 DOA: 09/10/2024 PCP: [name], MD   Brief Narrative: Patient is a 61 female with history of right lung cancer, recurrent right-sided malignant pleural effusion, ILD, COPD, chronic hypoxic respiratory failure on 3 L of oxygen at home, diabetes type 2, coronary artery disease, recurrent admission for right-sided pleural effusion, currently on chemotherapy for lung cancer presented with complaint of feeling weak, fatigued, lightheadedness.  Chest x-ray showed reaccumulation of the right pleural effusion.  PCCM consulted.  Patient was offered PleurX catheter placement but she declined saying poor support at home.  S/p thoracentesis on 09/11/2024.  Patient interested on being placed at SNF.  Medically stable for discharge whenever possible.  TOC following  Assessment & Plan:  Principal Problem:   FTT (failure to thrive) in adult Active Problems:   Primary cancer of right lower lobe of lung (HCC)   ILD (interstitial lung disease) (HCC)   Type 2 diabetes mellitus with microalbuminuria (HCC)   COPD (chronic obstructive pulmonary disease) (HCC)   Hypomagnesemia   Ectopic atrial tachycardia (HCC)   Hypercalcemia   Hyponatremia   Normocytic anemia   Malignant pleural effusion   Weakness  Generalized weakness/fatigue: Multifactorial, secondary to chemotherapy, malignancy, right pleural effusion, dehydration.  Appeared to be dehydrated, hypotensive on presentation.  Given IV fluid.  PT/OT evaluation done.  Patient is interested on SNF placement.  Patient lives with her husband.  Has to climb stairs to go to her room  Recurrent right-sided pleural effusion: Had thoracentesis multiple times.  Malignant pleural effusion.PCCM consulted.  Patient was offered PleurX catheter placement but she declined saying poor support at home.  S/p thoracentesis on 09/11/2024.  Suspected UTI: UA was suspicious for UTI.  She complained of dysuria on  presentation.  Started on ceftriaxone .  Urine culture showed multiple species.  Will stop antibiotic after 3 doses  History of right lung cancer: Currently on chemotherapy.  Follows with Dr. Sherrod.  Chronic hypoxic respiratory failure/ILD/COPD: Chronically on 3 L of oxygen per minute.  Currently not on exacerbation  Normocytic anemia: Given unit of blood transfusion during this hospitalization.  Currently hemoglobin is stable  Oral candidiasis: Continue nystatin /Diflucan   History of ectopic atrial tachycardia: On bisoprolol .  Seen by cardiology on last hospitalization  Diabetes type 2: A1c of 9.3.  On glipizide , metformin  at home.  Currently on insulin  and sliding scale  Hypertension: Continue to hold Cardizem  for now.On bisoprolol   Hypomagnesemia: Supplemented with magnesium           DVT prophylaxis:enoxaparin  (LOVENOX ) injection 40 mg Start: 09/10/24 2200     Code Status: Limited: Do not attempt resuscitation (DNR) -DNR-LIMITED -Do Not Intubate/DNI   Family Communication: None at bedside  Patient status:Inpatient  Patient is from :Home  Anticipated discharge to:SNF  Estimated DC date:whenever possible   Consultants: PCCM  Procedures:Thoracentesis  Antimicrobials:  Anti-infectives (From admission, onward)    Start     Dose/Rate Route Frequency Ordered Stop   09/11/24 1000  cefTRIAXone  (ROCEPHIN ) 1 g in sodium chloride  0.9 % 100 mL IVPB        1 g 200 mL/hr over 30 Minutes Intravenous Every 24 hours 09/11/24 0903     09/10/24 1815  fluconazole  (DIFLUCAN ) tablet 100 mg        100 mg Oral Daily 09/10/24 1801 09/15/24 0959       Subjective: Patient seen and examined at bedside today.  Sitting in the chair.  Does not look in any kind  of distress.  Alert and oriented.  On 3 L of oxygen per minute which is her baseline.  Currently worsening but shortness of breath or cough.  She says she is interested to go to SNF for few weeks to gain some  strength  Objective: Vitals:   09/12/24 1019 09/12/24 1022 09/12/24 1024 09/12/24 1315  BP:    (!) 137/91  Pulse:    99  Resp:    20  Temp:    98.6 F (37 C)  TempSrc:    Oral  SpO2: 100% 100% 100% 100%  Weight:      Height:        Intake/Output Summary (Last 24 hours) at 09/12/2024 1346 Last data filed at 09/12/2024 0700 Gross per 24 hour  Intake 2207.21 ml  Output 300 ml  Net 1907.21 ml   Filed Weights   09/10/24 2006  Weight: 64.5 kg    Examination:  General exam: Overall comfortable, not in distress, weak appearing HEENT: PERRL Respiratory system: Diminished sounds bilaterally more on the right Cardiovascular system: S1 & S2 heard, RRR.  Gastrointestinal system: Abdomen is nondistended, soft and nontender. Central nervous system: Alert and oriented Extremities: No edema, no clubbing ,no cyanosis Skin: No rashes, no ulcers,no icterus     Data Reviewed: I have personally reviewed following labs and imaging studies  CBC: Recent Labs  Lab 09/10/24 1550 09/11/24 0532 09/11/24 1718 09/12/24 0549  WBC 7.2 5.0  --  5.7  NEUTROABS 5.8  --   --  4.4  HGB 9.4* 7.7* 9.7* 8.3*  HCT 33.2* 27.6* 34.2* 28.0*  MCV 84.1 86.3  --  86.2  PLT 497* 331  --  298   Basic Metabolic Panel: Recent Labs  Lab 09/10/24 1550 09/11/24 0532 09/12/24 0549  NA 132* 132* 134*  K 4.1 4.0 3.9  CL 94* 98 102  CO2 29 22 20*  GLUCOSE 193* 243* 265*  BUN 20 14 9   CREATININE 0.53 0.38* 0.37*  CALCIUM  12.2* 10.4* 8.9  MG 1.4* 1.9 1.4*     Recent Results (from the past 240 hours)  Urine Culture (for pregnant, neutropenic or urologic patients or patients with an indwelling urinary catheter)     Status: Abnormal   Collection Time: 09/10/24  9:07 AM   Specimen: Urine, Clean Catch  Result Value Ref Range Status   Specimen Description   Final    URINE, CLEAN CATCH Performed at [facility], [address], [place], [place] [address]    Special Requests   Final     NONE Performed at [facility], [address] [place], [place] [address]    Culture MULTIPLE SPECIES PRESENT, SUGGEST RECOLLECTION (A)  Final   Report Status 09/12/2024 FINAL  Final  Resp panel by RT-PCR (RSV, Flu A&B, Covid) Anterior Nasal Swab     Status: None   Collection Time: 09/10/24  3:36 PM   Specimen: Anterior Nasal Swab  Result Value Ref Range Status   SARS Coronavirus 2 by RT PCR NEGATIVE NEGATIVE Final    Comment: (NOTE) SARS-CoV-2 target nucleic acids are NOT DETECTED.  The SARS-CoV-2 RNA is generally detectable in upper respiratory specimens during the acute phase of infection. The lowest concentration of SARS-CoV-2 viral copies this assay can detect is 138 copies/mL. A negative result does not preclude SARS-Cov-2 infection and should not be used as the sole basis for treatment or other patient management decisions. A negative result may occur with  improper specimen  collection/handling, submission of specimen other than nasopharyngeal swab, presence of viral mutation(s) within the areas targeted by this assay, and inadequate number of viral copies(<138 copies/mL). A negative result must be combined with clinical observations, patient history, and epidemiological information. The expected result is Negative.  Fact Sheet for Patients:  BloggerCourse.com  Fact Sheet for Healthcare Providers:  SeriousBroker.it  This test is no t yet approved or cleared by the [place]  FDA and  has been authorized for detection and/or diagnosis of SARS-CoV-2 by FDA under an Emergency Use Authorization (EUA). This EUA will remain  in effect (meaning this test can be used) for the duration of the COVID-19 declaration under Section 564(b)(1) of the Act, 21 U.S.C.section 360bbb-3(b)(1), unless the authorization is terminated  or revoked sooner.       Influenza A by PCR NEGATIVE NEGATIVE Final   Influenza B by  PCR NEGATIVE NEGATIVE Final    Comment: (NOTE) The Xpert Xpress SARS-CoV-2/FLU/RSV plus assay is intended as an aid in the diagnosis of influenza from Nasopharyngeal swab specimens and should not be used as a sole basis for treatment. Nasal washings and aspirates are unacceptable for Xpert Xpress SARS-CoV-2/FLU/RSV testing.  Fact Sheet for Patients: BloggerCourse.com  Fact Sheet for Healthcare Providers: SeriousBroker.it  This test is not yet approved or cleared by the [place]  FDA and has been authorized for detection and/or diagnosis of SARS-CoV-2 by FDA under an Emergency Use Authorization (EUA). This EUA will remain in effect (meaning this test can be used) for the duration of the COVID-19 declaration under Section 564(b)(1) of the Act, 21 U.S.C. section 360bbb-3(b)(1), unless the authorization is terminated or revoked.     Resp Syncytial Virus by PCR NEGATIVE NEGATIVE Final    Comment: (NOTE) Fact Sheet for Patients: BloggerCourse.com  Fact Sheet for Healthcare Providers: SeriousBroker.it  This test is not yet approved or cleared by the [place]  FDA and has been authorized for detection and/or diagnosis of SARS-CoV-2 by FDA under an Emergency Use Authorization (EUA). This EUA will remain in effect (meaning this test can be used) for the duration of the COVID-19 declaration under Section 564(b)(1) of the Act, 21 U.S.C. section 360bbb-3(b)(1), unless the authorization is terminated or revoked.  Performed at [facility], [address], [place], [place] [address]   Pleural fluid culture w Gram Stain     Status: None (Preliminary result)   Collection Time: 09/11/24 11:03 AM   Specimen: Pleural Fluid  Result Value Ref Range Status   Specimen Description   Final    PLEURAL Performed at [facility], [address]  [place], [place] [address]    Special Requests   Final    Immunocompromised Performed at [facility], [address], [place], [place] [address]    Gram Stain PENDING  Incomplete   Culture   Final    NO GROWTH < 24 HOURS Performed at [facility], [address] [place], [place] [address]    Report Status PENDING  Incomplete     Radiology Studies: DG Chest Port 1 View Result Date: 09/11/2024 CLINICAL DATA:  Status post right thoracentesis EXAM: PORTABLE CHEST 1 VIEW COMPARISON:  Chest radiograph dated 09/10/2024 FINDINGS: Low lung volumes. Similar diffuse interstitial and dense right basilar opacity. Decreased moderate right pleural effusion. No definite pneumothorax. Right heart border is obscured. No acute osseous abnormality. IMPRESSION: 1. Decreased moderate right pleural effusion. No definite pneumothorax. 2. Similar diffuse interstitial and dense right basilar  opacity. Electronically Signed   By: Limin  Xu M.D.   On: 09/11/2024 11:14   DG Chest Port 1 View Result Date: 09/10/2024 EXAM: 1 VIEW XRAY OF THE CHEST 09/10/2024 04:25:34 PM COMPARISON: 08/30/2024 CLINICAL HISTORY: Patient brought in by [facility] for weakness that she's been having since her chemo treatment on 09/04/24. FINDINGS: LUNGS AND PLEURA: Persistent right basilar airspace opacity. Persistent diffuse interstitial prominence. Moderate right pleural effusion with right basilar atelectasis. HEART AND MEDIASTINUM: Atherosclerotic plaque. BONES AND SOFT TISSUES: No acute osseous abnormality. IMPRESSION: 1. Moderate right pleural effusion with right basilar opacities, increased from prior. 2. Similar appearance of interstitial edema. Electronically signed by: Donnice Mania MD 09/10/2024 04:46 PM EDT RP Workstation: [id number]    Scheduled Meds:  bisoprolol   10 mg Oral Daily   enoxaparin  (LOVENOX ) injection  40 mg Subcutaneous Q24H   feeding supplement (GLUCERNA SHAKE)  237 mL Oral TID BM   fluconazole   100 mg  Oral Daily   insulin  aspart  0-15 Units Subcutaneous TID WC   insulin  glargine  5 Units Subcutaneous Daily   ipratropium-albuterol   3 mL Nebulization BID   nystatin   5 mL Oral QID   umeclidinium-vilanterol  1 puff Inhalation Daily   Continuous Infusions:  cefTRIAXone  (ROCEPHIN )  IV 1 g (09/12/24 1130)   magnesium  sulfate bolus IVPB       LOS: 2 days   Ivonne Mustache, MD [facility] P9/17/2025, 1:46 PM

## 2024-09-12 NOTE — Inpatient Diabetes Management (Signed)
 Inpatient Diabetes Program Recommendations  AACE/ADA: New Consensus Statement on Inpatient Glycemic Control (2015)  Target Ranges:  Prepandial:   less than 140 mg/dL      Peak postprandial:   less than 180 mg/dL (1-2 hours)      Critically ill patients:  140 - 180 mg/dL   Lab Results  Component Value Date   GLUCAP 257 (H) 09/12/2024   HGBA1C 9.3 (H) 08/17/2024    Review of Glycemic Control  Latest Reference Range & Units 09/11/24 07:38 09/11/24 12:05 09/11/24 17:28 09/11/24 21:49 09/12/24 07:19  Glucose-Capillary 70 - 99 mg/dL 751 (H) 710 (H) 751 (H) 218 (H) 257 (H)  (H): Data is abnormally high  Diabetes history: DM2 Outpatient Diabetes medications: Glipizide  XL 5 mg every day, Jardiance  25 every day, Novolog  0-11 units TID  Current orders for Inpatient glycemic control: Lantus  5 units every day, Novolog  0-15 units TID  Inpatient Diabetes Program Recommendations:    Lantus  12 units every day Novolog  2 units TID with meals Novolog  0-5 units at bedtime  Thank you, Wyvonna Pinal, MSN, CDCES Diabetes Coordinator Inpatient Diabetes Program [phone] (team pager from 8a-5p)

## 2024-09-12 NOTE — Evaluation (Signed)
 Occupational Therapy Evaluation Patient Details Name: Lindsey Williamson MRN: [id number] DOB: 01-09-1951 Today's Date: 09/12/2024   History of Present Illness   Patient is a 73 yr old female who presented 09/10/24 with weakness, fatigue, and lightheadedness. Chest x-ray showed re-accumulation of the right pleural effusion. S/P thoracentesis on 09-11-24. PMH: right lung cancer, recurrent right-sided malignant pleural effusion, ILD, COPD, chronic hypoxic respiratory failure on 3 L of oxygen at home, diabetes type 2, coronary artery disease, recurrent hospital admission for right-sided pleural effusion, currently on chemotherapy for lung cancer     Clinical Impressions The pt is currently presenting with the below listed deficits (see OT problem list). As such, her occupational performance is compromised and she requires assist for self-care management. During the session, she required min-mod assist for tasks, including simulated lower body dressing, sit to stand using a RW, and for short distance ambulation to tolerance using a RW. She was also noted to be with increased deconditioning, quick fatigue during activity, and generalized weakness. She required intermittent therapeutic rest breaks and modification of tasks to facilitate improved activity tolerance. She will benefit from further OT services to maximize her independence with self-care tasks and other meaningful activities. Patient will benefit from continued inpatient follow up therapy, <3 hours/day.      If plan is discharge home, recommend the following:   Assistance with cooking/housework;Help with stairs or ramp for entrance;Assist for transportation;A little help with bathing/dressing/bathroom;A little help with walking and/or transfers     Functional Status Assessment   Patient has had a recent decline in their functional status and demonstrates the ability to make significant improvements in function in a reasonable and  predictable amount of time.     Equipment Recommendations   BSC/3in1     Recommendations for Other Services         Precautions/Restrictions   Precautions Precautions: Fall Precaution/Restrictions Comments: monitor O2 Restrictions Weight Bearing Restrictions Per Provider Order: No Other Position/Activity Restrictions: O2 use at baseline     Mobility Bed Mobility Overal bed mobility: Needs Assistance Bed Mobility: Supine to Sit     Supine to sit: Supervision          Transfers Overall transfer level: Needs assistance Equipment used: Rolling walker (2 wheels) Transfers: Sit to/from Stand Sit to Stand: Min assist         Balance     Sitting balance-Leahy Scale:  (static sitting-good. dynamic sitting-fair+)         Standing balance comment: Min assist with RW       ADL either performed or assessed with clinical judgement   ADL Overall ADL's : Needs assistance/impaired Eating/Feeding: Independent;Sitting   Grooming: Set up;Sitting   Upper Body Bathing: Set up;Supervision/ safety;Sitting   Lower Body Bathing: Moderate assistance;Sitting/lateral leans;Sit to/from stand   Upper Body Dressing : Set up;Sitting   Lower Body Dressing: Moderate assistance;Sitting/lateral leans;Sit to/from stand   Toilet Transfer: Minimal assistance;Rolling walker (2 wheels);Ambulation;Grab bars                    Pertinent Vitals/Pain Pain Assessment Pain Assessment: Faces Pain Score: 5  Pain Location:  (site of recent thoracentesis) Pain Descriptors / Indicators: Sore Pain Intervention(s): Limited activity within patient's tolerance, Monitored during session     Extremity/Trunk Assessment Upper Extremity Assessment Upper Extremity Assessment: Generalized weakness;RUE deficits/detail;LUE deficits/detail RUE Deficits / Details: AROM WFL. Grip strength 4-/5 LUE Deficits / Details: AROM WFL. Grip strength 4-/5   Lower Extremity Assessment Lower  Extremity Assessment: Generalized weakness;LLE deficits/detail;RLE deficits/detail RLE Deficits / Details: AROM WFL LLE Deficits / Details: AROM WFL      Communication Communication Communication: No apparent difficulties   Cognition Arousal: Alert Behavior During Therapy: WFL for tasks assessed/performed Cognition: No apparent impairments             OT - Cognition Comments: Oriented x4                 Following commands: Intact       Cueing  General Comments   Cueing Techniques: Verbal cues              Home Living Family/patient expects to be discharged to:: Private residence Living Arrangements: Spouse/significant other Available Help at Discharge: Family Type of Home: Apartment Home Access: Stairs to enter Secretary/administrator of Steps:  (full flight) Entrance Stairs-Rails: Right Home Layout: One level               Home Equipment: Pharmacist, hospital (2 wheels);Rollator (4 wheels)   Additional Comments: The pt has 5 children, 2 of whom are local and able to assist as needed. She has been using 3L O2 around the clock. Her spouse and beula have been performing household chores. She does not drive.      Prior Functioning/Environment Prior Level of Function : Independent/Modified Independent             Mobility Comments:  (Over the past couple weeks, she has been fatigued and weak and has been only ambulating to & from the bathroom at home.) ADLs Comments:  (Over the past couple weeks, has had increased difficulty with performing self-care tasks, given weakness and fatigue. Prior to a couple weeks ago, she was modified independent to independent with ADLs, except for her spouse assisting with tub transfers.)    OT Problem List: Decreased strength;Decreased activity tolerance;Cardiopulmonary status limiting activity;Pain   OT Treatment/Interventions: Self-care/ADL training;Therapeutic exercise;Therapeutic activities;Energy  conservation;DME and/or AE instruction;Patient/family education;Balance training      OT Goals(Current goals can be found in the care plan section)   Acute Rehab OT Goals OT Goal Formulation: With patient Time For Goal Achievement: 09/26/24 Potential to Achieve Goals: Good ADL Goals Pt Will Perform Grooming: with set-up;with supervision;sitting;standing Pt Will Perform Lower Body Dressing: with supervision;with set-up;sitting/lateral leans;sit to/from stand Pt Will Transfer to Toilet: with supervision;ambulating Pt Will Perform Toileting - Clothing Manipulation and hygiene: with supervision;sit to/from stand   OT Frequency:  Min 2X/week    Co-evaluation PT/OT/SLP Co-Evaluation/Treatment: Yes Reason for Co-Treatment: To address functional/ADL transfers PT goals addressed during session: Mobility/safety with mobility;Balance;Proper use of DME OT goals addressed during session: ADL's and self-care      AM-PAC OT 6 Clicks Daily Activity     Outcome Measure Help from another person eating meals?: None Help from another person taking care of personal grooming?: A Little Help from another person toileting, which includes using toliet, bedpan, or urinal?: A Lot Help from another person bathing (including washing, rinsing, drying)?: A Lot Help from another person to put on and taking off regular upper body clothing?: A Little Help from another person to put on and taking off regular lower body clothing?: A Lot 6 Click Score: 16   End of Session Equipment Utilized During Treatment: Gait belt;Rolling walker (2 wheels);Oxygen Nurse Communication: Mobility status  Activity Tolerance: Patient limited by fatigue Patient left: in chair;with call bell/phone within reach;with chair alarm set  OT Visit Diagnosis: Muscle weakness (generalized) (M62.81);Pain Pain -  part of body:  (R flank at thoracentesis site)                Time: 8857-8779 OT Time Calculation (min): 38 min Charges:  OT  General Charges $OT Visit: 1 Visit OT Evaluation $OT Eval Moderate Complexity: 1 Mod OT Treatments $Self Care/Home Management : 8-22 mins    Delanna JINNY Lesches, OTR/L 09/12/2024, 3:43 PM

## 2024-09-12 NOTE — Evaluation (Signed)
 Physical Therapy Evaluation Patient Details Name: Lindsey Williamson MRN: [id number] DOB: 30-Jan-1951 Today's Date: 09/12/2024  History of Present Illness  Patient is a 52 female presented 09/10/24 with complaint of feeling weak, fatigued, lightheadedness. Chest x-ray showed reaccumulation of the right pleural effusion. S/P thoracentesis. PMH: right lung cancer, recurrent right-sided malignant pleural effusion, ILD, COPD, chronic hypoxic respiratory failure on 3 L of oxygen at home, diabetes type 2, coronary artery disease, recurrent admission for right-sided pleural effusion, currently on chemotherapy for lung cancer  Clinical Impression  The patient  reports feeling weak but wants to  ambulate. Patient   reports limited activity since  DC. Patient did ambulate on 3 L  [place] x 10', very slowly.  Patient does appear very fatigued with activity. Patient will benefit from continued inpatient follow up therapy, <3 hours/day         If plan is discharge home, recommend the following: A little help with walking and/or transfers;A little help with bathing/dressing/bathroom;Assistance with cooking/housework;Assist for transportation;Help with stairs or ramp for entrance   Can travel by private vehicle        Equipment Recommendations None recommended by PT  Recommendations for Other Services       Functional Status Assessment Patient has had a recent decline in their functional status and demonstrates the ability to make significant improvements in function in a reasonable and predictable amount of time.     Precautions / Restrictions Precautions Precautions: Fall Precaution/Restrictions Comments: monitor O2 Restrictions Weight Bearing Restrictions Per Provider Order: No Other Position/Activity Restrictions: O2 use at baseline      Mobility  Bed Mobility Overal bed mobility: Needs Assistance Bed Mobility: Supine to Sit     Supine to sit: Supervision          Transfers Overall  transfer level: Needs assistance Equipment used: Rolling walker (2 wheels) Transfers: Sit to/from Stand Sit to Stand: Min assist           General transfer comment: cues for hand placement    Ambulation/Gait Ambulation/Gait assistance: Min assist, +2 safety/equipment Gait Distance (Feet): 10 Feet Assistive device: Rolling walker (2 wheels) Gait Pattern/deviations: Step-through pattern Gait velocity: decr     General Gait Details: slow  to progress forward. Stops to  rest  Stairs            Wheelchair Mobility     Tilt Bed    Modified Rankin (Stroke Patients Only)       Balance Overall balance assessment: Mild deficits observed, not formally tested                                           Pertinent Vitals/Pain Pain Assessment Pain Assessment: Faces Faces Pain Scale: Hurts even more Pain Location: site of TC Pain Descriptors / Indicators: Discomfort, Grimacing, Guarding Pain Intervention(s): Monitored during session    Home Living Family/patient expects to be discharged to:: Private residence Living Arrangements: Spouse/significant other Available Help at Discharge: Family Type of Home: Apartment Home Access: Stairs to enter Entrance Stairs-Rails: Right Entrance Stairs-Number of Steps: 15   Home Layout: One level Home Equipment: Pharmacist, hospital (2 wheels);Rollator (4 wheels) Additional Comments: The pt has 5 children, 2 of whom are local and able to assist as needed.    Prior Function               Mobility Comments: has  been  limited to ambulating to BR with assistance, mosty stays in bed.       Extremity/Trunk Assessment        Lower Extremity Assessment Lower Extremity Assessment: Generalized weakness    Cervical / Trunk Assessment Cervical / Trunk Assessment: Normal  Communication   Communication Communication: No apparent difficulties    Cognition Arousal: Alert Behavior During Therapy: WFL  for tasks assessed/performed   PT - Cognitive impairments: No apparent impairments                         Following commands: Intact       Cueing Cueing Techniques: Verbal cues     General Comments      Exercises     Assessment/Plan    PT Assessment Patient needs continued PT services  PT Problem List Decreased strength;Decreased range of motion;Decreased activity tolerance;Decreased balance;Decreased mobility;Decreased knowledge of use of DME;Pain       PT Treatment Interventions DME instruction;Gait training;Stair training;Functional mobility training;Therapeutic activities;Therapeutic exercise;Patient/family education    PT Goals (Current goals can be found in the Care Plan section)  Acute Rehab PT Goals Patient Stated Goal: Regain IND PT Goal Formulation: With patient Time For Goal Achievement: 09/26/24 Potential to Achieve Goals: Good    Frequency Min 2X/week     Co-evaluation PT/OT/SLP Co-Evaluation/Treatment: Yes Reason for Co-Treatment: To address functional/ADL transfers PT goals addressed during session: Mobility/safety with mobility;Balance;Proper use of DME         AM-PAC PT 6 Clicks Mobility  Outcome Measure Help needed turning from your back to your side while in a flat bed without using bedrails?: A Little Help needed moving from lying on your back to sitting on the side of a flat bed without using bedrails?: A Little Help needed moving to and from a bed to a chair (including a wheelchair)?: A Little Help needed standing up from a chair using your arms (e.g., wheelchair or bedside chair)?: A Little Help needed to walk in hospital room?: A Little Help needed climbing 3-5 steps with a railing? : A Lot 6 Click Score: 17    End of Session Equipment Utilized During Treatment: Gait belt Activity Tolerance: Patient limited by fatigue Patient left: in chair;with call bell/phone within reach;with chair alarm set Nurse Communication:  Mobility status PT Visit Diagnosis: Difficulty in walking, not elsewhere classified (R26.2)    Time: 8857-8779 PT Time Calculation (min) (ACUTE ONLY): 38 min   Charges:   PT Evaluation $PT Eval Low Complexity: 1 Low   PT General Charges $$ ACUTE PT VISIT: 1 Visit         Darice Potters PT Acute Rehabilitation Services Office [phone]   Potters Darice Norris 09/12/2024, 2:12 PM

## 2024-09-12 NOTE — TOC Initial Note (Signed)
 Transition of Care [facility]) - Initial/Assessment Note    Patient Details  Name: Lindsey Williamson MRN: [id number] Date of Birth: January 19, 1951  Transition [facility]) CM/SW Contact:    Doneta Glenys DASEN, RN Phone Number: 09/12/2024, 11:50 AM  Clinical Narrative:                 Presented for weakness. Patient states PTA lives in an apartment with Lartigue,William (Spouse)[phone];verified PCP/insurance; DME-walker, [employer]-oxygen & will need a travel tank, [facility]-PT/OT/Nurse; Elsie will transport home at discharge. CM contacted Hedda Cower to confirm active with services and [employer] Jermaine to request travel tank.  Expected Discharge Plan: Home w Home Health Services Barriers to Discharge: Continued Medical Work up   Patient Goals and CMS Choice Patient states their goals for this hospitalization and ongoing recovery are:: Home with [facility] CMS Medicare.gov Compare Post Acute Care list provided to::  (NA) Choice offered to / list presented to : NA [place] ownership interest in [facility].provided to:: Parent NA    Expected Discharge Plan and Services In-house Referral: NA Discharge Planning Services: CM Consult   Living arrangements for the past 2 months: Apartment                 DME Arranged: N/A DME Agency: NA       HH Arranged: NA HH Agency: NA        Prior Living Arrangements/Services Living arrangements for the past 2 months: Apartment Lives with:: Spouse Patient language and need for interpreter reviewed:: Yes Do you feel safe going back to the place where you live?: Yes      Need for Family Participation in Patient Care: Yes (Comment) Care giver support system in place?: Yes (comment) Current home services: DME (walker, [employer]-oxygen) Criminal Activity/Legal Involvement Pertinent to Current Situation/Hospitalization: No - Comment as needed  Activities of Daily Living   ADL Screening (condition at time of admission) Independently  performs ADLs?: Yes (appropriate for developmental age) Is the patient deaf or have difficulty hearing?: No Does the patient have difficulty seeing, even when wearing glasses/contacts?: No Does the patient have difficulty concentrating, remembering, or making decisions?: No  Permission Sought/Granted Permission sought to share information with : Case Manager Permission granted to share information with : Yes, Verbal Permission Granted  Share Information with NAME: Rivero,William (Spouse)  [phone]  Permission granted to share info w AGENCY: [facility]        Emotional Assessment Appearance:: Appears stated age Attitude/Demeanor/Rapport: Engaged Affect (typically observed): Appropriate Orientation: : Oriented to Self, Oriented to Place, Oriented to  Time, Oriented to Situation Alcohol / Substance Use: Not Applicable Psych Involvement: No (comment)  Admission diagnosis:  Hypercalcemia [E83.52] Hypomagnesemia [E83.42] Weakness [R53.1] FTT (failure to thrive) in adult [R62.7] Pleural effusion on right [J90] Patient Active Problem List   Diagnosis Date Noted   Weakness 09/11/2024   FTT (failure to thrive) in adult 09/10/2024   Hypercalcemia 09/10/2024   Hyponatremia 09/10/2024   Normocytic anemia 09/10/2024   Malignant pleural effusion 09/10/2024   Pleural effusion on right 08/22/2024   COPD exacerbation (HCC) 08/16/2024   Hypomagnesemia 08/16/2024   Prolonged QT interval 08/16/2024   Ectopic atrial tachycardia (HCC) 08/16/2024   Pneumonitis 04/06/2024   Acute on chronic respiratory failure with hypoxia (HCC) 04/06/2024   ILD (interstitial lung disease) (HCC) 04/06/2024   COPD (chronic obstructive pulmonary disease) (HCC) 04/06/2024   Hypoxia 02/13/2024   Encounter for antineoplastic immunotherapy 02/09/2024   Encounter for antineoplastic chemotherapy 11/15/2023  Goals of care, counseling/discussion 10/25/2023   Angioedema 10/17/2023   Bleeding per rectum 07/30/2022    Diverticular disease of colon 07/30/2022   First degree hemorrhoids 07/30/2022   Hyperglycemia due to type 2 diabetes mellitus (HCC) 07/30/2022   Primary cancer of right lower lobe of lung (HCC) 04/08/2022   Lung nodule 03/22/2022   Dizziness 05/19/2020   Allergic rhinitis with a predominant nonallergic component. 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   Tobacco abuse 12/16/2014   Hyperlipidemia 12/12/2013   Hypertension 12/12/2013   PCP:  [name], MD Pharmacy:   [facility] [phone] [address], [place] - [address] AT [facility] OF [facility] [address] [place] [address] Phone: [phone] Fax: [phone]     Social Drivers of Health (SDOH) Social History: SDOH Screenings   Food Insecurity: No Food Insecurity (09/10/2024)  Housing: Low Risk  (09/10/2024)  Transportation Needs: No Transportation Needs (09/10/2024)  Utilities: Not At Risk (09/10/2024)  Depression (PHQ2-9): Low Risk  (09/04/2024)  Financial Resource Strain: Low Risk  (04/24/2024)   Received from [facility]  Physical Activity: Inactive (04/24/2024)   Received from [facility]  Social Connections: Moderately Integrated (09/10/2024)  Stress: Stress Concern Present (04/24/2024)   [facility] from [facility]  Tobacco Use: Medium Risk (09/10/2024)   SDOH Interventions:     Readmission Risk Interventions    09/12/2024   11:45 AM 08/20/2024   10:10 AM 02/18/2024    9:51 AM  Readmission Risk Prevention Plan  Transportation Screening Complete Complete Complete  PCP or Specialist  Appt within 3-5 Days   Complete  HRI or Home Care Consult   Complete  Social Work Consult for Recovery Care Planning/Counseling   Complete  Palliative Care Screening   Not Applicable  Medication Review Oceanographer) Complete Complete Complete  PCP or Specialist appointment within 3-5 days of discharge Complete Complete   HRI or Home Care Consult Complete Complete   SW Recovery Care/Counseling Consult Complete Complete   Palliative Care Screening Patient Refused Not Applicable   Skilled Nursing Facility Not Applicable Not Applicable

## 2024-09-12 NOTE — Plan of Care (Signed)

## 2024-09-13 ENCOUNTER — Inpatient Hospital Stay: Admitting: Licensed Clinical Social Worker

## 2024-09-13 DIAGNOSIS — C3431 Malignant neoplasm of lower lobe, right bronchus or lung: Secondary | ICD-10-CM

## 2024-09-13 DIAGNOSIS — R627 Adult failure to thrive: Secondary | ICD-10-CM | POA: Diagnosis not present

## 2024-09-13 LAB — CBC WITH DIFFERENTIAL/PLATELET
Abs Immature Granulocytes: 0.05 K/uL (ref 0.00–0.07)
Basophils Absolute: 0 K/uL (ref 0.0–0.1)
Basophils Relative: 0 %
Eosinophils Absolute: 0.1 K/uL (ref 0.0–0.5)
Eosinophils Relative: 3 %
HCT: 28.8 % — ABNORMAL LOW (ref 36.0–46.0)
Hemoglobin: 8.4 g/dL — ABNORMAL LOW (ref 12.0–15.0)
Immature Granulocytes: 1 %
Lymphocytes Relative: 14 %
Lymphs Abs: 0.7 K/uL (ref 0.7–4.0)
MCH: 24.9 pg — ABNORMAL LOW (ref 26.0–34.0)
MCHC: 29.2 g/dL — ABNORMAL LOW (ref 30.0–36.0)
MCV: 85.5 fL (ref 80.0–100.0)
Monocytes Absolute: 0.6 K/uL (ref 0.1–1.0)
Monocytes Relative: 13 %
Neutro Abs: 3.4 K/uL (ref 1.7–7.7)
Neutrophils Relative %: 69 %
Platelets: 289 K/uL (ref 150–400)
RBC: 3.37 MIL/uL — ABNORMAL LOW (ref 3.87–5.11)
RDW: 16.9 % — ABNORMAL HIGH (ref 11.5–15.5)
WBC: 4.9 K/uL (ref 4.0–10.5)
nRBC: 0 % (ref 0.0–0.2)

## 2024-09-13 LAB — BASIC METABOLIC PANEL WITH GFR
Anion gap: 11 (ref 5–15)
BUN: 5 mg/dL — ABNORMAL LOW (ref 8–23)
CO2: 21 mmol/L — ABNORMAL LOW (ref 22–32)
Calcium: 9.2 mg/dL (ref 8.9–10.3)
Chloride: 101 mmol/L (ref 98–111)
Creatinine, Ser: 0.34 mg/dL — ABNORMAL LOW (ref 0.44–1.00)
GFR, Estimated: >60 mL/min (ref >60)
Glucose, Bld: 203 mg/dL — ABNORMAL HIGH (ref 70–99)
Potassium: 3.6 mmol/L (ref 3.5–5.1)
Sodium: 133 mmol/L — ABNORMAL LOW (ref 135–145)

## 2024-09-13 LAB — GLUCOSE, CAPILLARY
Glucose-Capillary: 205 mg/dL — ABNORMAL HIGH (ref 70–99)
Glucose-Capillary: 207 mg/dL — ABNORMAL HIGH (ref 70–99)
Glucose-Capillary: 264 mg/dL — ABNORMAL HIGH (ref 70–99)
Glucose-Capillary: 261 mg/dL — ABNORMAL HIGH (ref 70–99)

## 2024-09-13 MED ORDER — POLYETHYLENE GLYCOL 3350 17 G PO PACK
17.0000 g | PACK | Freq: Every day | ORAL | Status: DC
  Administered 2024-09-13 – 2024-09-14 (×2): 17 g via ORAL
  Filled 2024-09-13 (×2): qty 1

## 2024-09-13 MED ORDER — BISACODYL 10 MG RE SUPP
10.0000 mg | Freq: Once | RECTAL | Status: DC
  Filled 2024-09-13: qty 1

## 2024-09-13 MED ORDER — SENNOSIDES-DOCUSATE SODIUM 8.6-50 MG PO TABS
1.0000 | ORAL_TABLET | Freq: Two times a day (BID) | ORAL | Status: DC
  Administered 2024-09-13 – 2024-09-14 (×3): 1 via ORAL
  Filled 2024-09-13 (×3): qty 1

## 2024-09-13 NOTE — TOC Progression Note (Addendum)
 Transition of Care [facility]) - Progression Note    Patient Details  Name: Lindsey Williamson MRN: [id number] Date of Birth: [birth year]  [facility]) CM/SW Contact  Doneta Glenys DASEN, RN Phone Number: 09/13/2024, 8:56 AM  Clinical Narrative:    CM started PASRR - [place] Must ID [phone], came back as a Level 2 requiring additional documentation. FL2 and 30 Day Note started and waiting on provider co-sign. MD aware.  11:20 AM CM uploaded requested documents to [place] Must. 3:31 PM Choice List Given. Room 1621       Jerelene Essex       Service Provider Request Status STAR(s) Address Phone Patient Preferred  [facility] Preferred SNF  Accepted 1 [address], [place] [address] [phone]   [facility] SN[facility]  Accepted 1 [address], [place] [address] [phone]   [facility]  Accepted 2 [address] [place] [address] [phone]   [facility] SN[facility]  Accepted 1 [address] [place] [address] [phone]     Expected Discharge Plan: Home w Home Health Services Barriers to Discharge: Continued Medical Work up               Expected Discharge Plan and Services In-house Referral: NA Discharge Planning Services: CM Consult   Living arrangements for the past 2 months: Apartment                 DME Arranged: N/A DME Agency: NA       HH Arranged: NA HH Agency: NA         Social Drivers of Health (SDOH) Interventions SDOH Screenings   Food Insecurity: No Food Insecurity (09/10/2024)  Housing: Low Risk  (09/10/2024)  Transportation Needs: No Transportation Needs (09/10/2024)  Utilities: Not At Risk (09/10/2024)  Depression (PHQ2-9): Low Risk  (09/04/2024)  Financial Resource Strain: Low Risk  (04/24/2024)   Received from [facility]  Physical Activity: Inactive (04/24/2024)   Received from [facility]  Social Connections: Moderately Integrated (09/10/2024)  Stress: Stress Concern  Present (04/24/2024)   Received from [facility]  Tobacco Use: Medium Risk (09/10/2024)    Readmission Risk Interventions    09/12/2024   11:45 AM 08/20/2024   10:10 AM 02/18/2024    9:51 AM  Readmission Risk Prevention Plan  Transportation Screening Complete Complete Complete  PCP or Specialist Appt within 3-5 Days   Complete  HRI or Home Care Consult   Complete  Social Work Consult for Recovery Care Planning/Counseling   Complete  Palliative Care Screening   Not Applicable  Medication Review Oceanographer) Complete Complete Complete  PCP or Specialist appointment within 3-5 days of discharge Complete Complete   HRI or Home Care Consult Complete Complete   SW Recovery Care/Counseling Consult Complete Complete   Palliative Care Screening Patient Refused Not Applicable   Skilled Nursing Facility Not Applicable Not Applicable

## 2024-09-13 NOTE — Plan of Care (Signed)

## 2024-09-13 NOTE — Progress Notes (Signed)
 PROGRESS NOTE  AINSLEY DEAKINS  FMW:[id number] DOB: [birth year] DOA: 09/10/2024 PCP: [name], MD   Brief Narrative: Patient is a 20 female with history of right lung cancer, recurrent right-sided malignant pleural effusion, ILD, COPD, chronic hypoxic respiratory failure on 3 L of oxygen at home, diabetes type 2, coronary artery disease, recurrent admission for right-sided pleural effusion, currently on chemotherapy for lung cancer presented with complaint of feeling weak, fatigued, lightheadedness.  Chest x-ray showed reaccumulation of the right pleural effusion.  PCCM consulted.  Patient was offered PleurX catheter placement but she declined saying poor support at home.  S/p thoracentesis on 09/11/2024.  Patient interested on being placed at SNF.  Medically stable for discharge whenever possible.  TOC following  Assessment & Plan:  Principal Problem:   FTT (failure to thrive) in adult Active Problems:   Primary cancer of right lower lobe of lung (HCC)   ILD (interstitial lung disease) (HCC)   Type 2 diabetes mellitus with microalbuminuria (HCC)   COPD (chronic obstructive pulmonary disease) (HCC)   Hypomagnesemia   Ectopic atrial tachycardia (HCC)   Hypercalcemia   Hyponatremia   Normocytic anemia   Malignant pleural effusion   Weakness  Generalized weakness/fatigue: Multifactorial, secondary to chemotherapy, malignancy, right pleural effusion, dehydration.  Appeared to be dehydrated, hypotensive on presentation.  Given IV fluid.  PT/OT evaluation done.  Patient is interested on SNF placement.  Patient lives with her husband.  Has to climb stairs to go to her room  Recurrent right-sided pleural effusion: Had thoracentesis multiple times.  Malignant pleural effusion.PCCM consulted.  Patient was offered PleurX catheter placement but she declined saying poor support at home.  S/p thoracentesis on 09/11/2024.  Suspected UTI: UA was suspicious for UTI.  She complained of dysuria on  presentation.  Started on ceftriaxone .  Urine culture showed multiple species.  Completed 3 days course of ceftriaxone   History of right lung cancer: Currently on chemotherapy.  Follows with Dr. Sherrod.  Chronic hypoxic respiratory failure/ILD/COPD: Chronically on 3 L of oxygen per minute.  Currently not on exacerbation  Normocytic anemia: Given a unit of blood transfusion during this hospitalization.  Currently hemoglobin is stable  Oral candidiasis: Continue nystatin /Diflucan   History of ectopic atrial tachycardia: On bisoprolol .  Seen by cardiology on last hospitalization  Diabetes type 2: A1c of 9.3.  On glipizide , metformin  at home.  Currently on insulin  and sliding scale  Hypertension: Continue to hold Cardizem  for now.On bisoprolol   Hypomagnesemia: Supplemented with magnesium           DVT prophylaxis:enoxaparin  (LOVENOX ) injection 40 mg Start: 09/10/24 2200     Code Status: Limited: Do not attempt resuscitation (DNR) -DNR-LIMITED -Do Not Intubate/DNI   Family Communication: None at bedside  Patient status:Inpatient  Patient is from :Home  Anticipated discharge to:SNF  Estimated DC date:whenever possible   Consultants: PCCM  Procedures:Thoracentesis  Antimicrobials:  Anti-infectives (From admission, onward)    Start     Dose/Rate Route Frequency Ordered Stop   09/11/24 1000  cefTRIAXone  (ROCEPHIN ) 1 g in sodium chloride  0.9 % 100 mL IVPB        1 g 200 mL/hr over 30 Minutes Intravenous Every 24 hours 09/11/24 0903 09/13/24 1054   09/10/24 1815  fluconazole  (DIFLUCAN ) tablet 100 mg        100 mg Oral Daily 09/10/24 1801 09/15/24 0959       Subjective: Patient seen and examined at bedside today.  Appears comfortable.  Lying in bed.  On baseline oxygen  requirement.  Hemodynamically stable.  Denies any new complaints.  Agreeable for SNF.  Waiting for bed  Objective: Vitals:   09/12/24 1315 09/12/24 1943 09/12/24 2203 09/13/24 0636  BP: (!) 137/91  (!)  145/89 128/85  Pulse: 99  (!) 101 93  Resp: 20  18 18   Temp: 98.6 F (37 C)  98.7 F (37.1 C) 98.6 F (37 C)  TempSrc: Oral     SpO2: 100% 100% 100% 100%  Weight:      Height:        Intake/Output Summary (Last 24 hours) at 09/13/2024 1129 Last data filed at 09/13/2024 0353 Gross per 24 hour  Intake 143.33 ml  Output --  Net 143.33 ml   Filed Weights   09/10/24 2006  Weight: 64.5 kg    Examination:   General exam: Overall comfortable, not in distress,lying on bed HEENT: PERRL Respiratory system: Diminished bilateral air sounds more on the right Cardiovascular system: S1 & S2 heard, RRR.  Gastrointestinal system: Abdomen is nondistended, soft and nontender. Central nervous system: Alert and oriented Extremities: No edema, no clubbing ,no cyanosis Skin: No rashes, no ulcers,no icterus     Data Reviewed: I have personally reviewed following labs and imaging studies  CBC: Recent Labs  Lab 09/10/24 1550 09/11/24 0532 09/11/24 1718 09/12/24 0549 09/13/24 0541  WBC 7.2 5.0  --  5.7 4.9  NEUTROABS 5.8  --   --  4.4 3.4  HGB 9.4* 7.7* 9.7* 8.3* 8.4*  HCT 33.2* 27.6* 34.2* 28.0* 28.8*  MCV 84.1 86.3  --  86.2 85.5  PLT 497* 331  --  298 289   Basic Metabolic Panel: Recent Labs  Lab 09/10/24 1550 09/11/24 0532 09/12/24 0549 09/13/24 0541  NA 132* 132* 134* 133*  K 4.1 4.0 3.9 3.6  CL 94* 98 102 101  CO2 29 22 20* 21*  GLUCOSE 193* 243* 265* 203*  BUN 20 14 9  5*  CREATININE 0.53 0.38* 0.37* 0.34*  CALCIUM  12.2* 10.4* 8.9 9.2  MG 1.4* 1.9 1.4*  --      Recent Results (from the past 240 hours)  Urine Culture (for pregnant, neutropenic or urologic patients or patients with an indwelling urinary catheter)     Status: Abnormal   Collection Time: 09/10/24  9:07 AM   Specimen: Urine, Clean Catch  Result Value Ref Range Status   Specimen Description   Final    URINE, CLEAN CATCH Performed at [facility], [address], [place],  [place] [address]    Special Requests   Final    NONE Performed at [facility], [address] [place], [place] [address]    Culture MULTIPLE SPECIES PRESENT, SUGGEST RECOLLECTION (A)  Final   Report Status 09/12/2024 FINAL  Final  Resp panel by RT-PCR (RSV, Flu A&B, Covid) Anterior Nasal Swab     Status: None   Collection Time: 09/10/24  3:36 PM   Specimen: Anterior Nasal Swab  Result Value Ref Range Status   SARS Coronavirus 2 by RT PCR NEGATIVE NEGATIVE Final    Comment: (NOTE) SARS-CoV-2 target nucleic acids are NOT DETECTED.  The SARS-CoV-2 RNA is generally detectable in upper respiratory specimens during the acute phase of infection. The lowest concentration of SARS-CoV-2 viral copies this assay can detect is 138 copies/mL. A negative result does not preclude SARS-Cov-2 infection and should not be used as the sole basis for treatment or other patient management decisions. A negative result may occur with  improper specimen collection/handling, submission of specimen other than nasopharyngeal swab, presence of viral mutation(s) within the areas targeted by this assay, and inadequate number of viral copies(<138 copies/mL). A negative result must be combined with clinical observations, patient history, and epidemiological information. The expected result is Negative.  Fact Sheet for Patients:  BloggerCourse.com  Fact Sheet for Healthcare Providers:  SeriousBroker.it  This test is no t yet approved or cleared by the [place]  FDA and  has been authorized for detection and/or diagnosis of SARS-CoV-2 by FDA under an Emergency Use Authorization (EUA). This EUA will remain  in effect (meaning this test can be used) for the duration of the COVID-19 declaration under Section 564(b)(1) of the Act, 21 U.S.C.section 360bbb-3(b)(1), unless the authorization is terminated  or revoked sooner.       Influenza A by PCR  NEGATIVE NEGATIVE Final   Influenza B by PCR NEGATIVE NEGATIVE Final    Comment: (NOTE) The Xpert Xpress SARS-CoV-2/FLU/RSV plus assay is intended as an aid in the diagnosis of influenza from Nasopharyngeal swab specimens and should not be used as a sole basis for treatment. Nasal washings and aspirates are unacceptable for Xpert Xpress SARS-CoV-2/FLU/RSV testing.  Fact Sheet for Patients: BloggerCourse.com  Fact Sheet for Healthcare Providers: SeriousBroker.it  This test is not yet approved or cleared by the [place]  FDA and has been authorized for detection and/or diagnosis of SARS-CoV-2 by FDA under an Emergency Use Authorization (EUA). This EUA will remain in effect (meaning this test can be used) for the duration of the COVID-19 declaration under Section 564(b)(1) of the Act, 21 U.S.C. section 360bbb-3(b)(1), unless the authorization is terminated or revoked.     Resp Syncytial Virus by PCR NEGATIVE NEGATIVE Final    Comment: (NOTE) Fact Sheet for Patients: BloggerCourse.com  Fact Sheet for Healthcare Providers: SeriousBroker.it  This test is not yet approved or cleared by the [place]  FDA and has been authorized for detection and/or diagnosis of SARS-CoV-2 by FDA under an Emergency Use Authorization (EUA). This EUA will remain in effect (meaning this test can be used) for the duration of the COVID-19 declaration under Section 564(b)(1) of the Act, 21 U.S.C. section 360bbb-3(b)(1), unless the authorization is terminated or revoked.  Performed at [facility], [address], [place], [place] [address]   Pleural fluid culture w Gram Stain     Status: None (Preliminary result)   Collection Time: 09/11/24 11:03 AM   Specimen: Pleural Fluid  Result Value Ref Range Status   Specimen Description   Final    PLEURAL Performed at [facility], [address] [place], [place] [address]    Special Requests   Final    Immunocompromised Performed at [facility], [address], [place], [place] [address]    Gram Stain NO WBC SEEN NO ORGANISMS SEEN   Final   Culture   Final    NO GROWTH 2 DAYS Performed at [facility], [address], [place], [place] [address]    Report Status PENDING  Incomplete     Radiology Studies: No results found.   Scheduled Meds:  bisacodyl   10 mg Rectal Once   bisoprolol   10 mg Oral Daily   enoxaparin  (LOVENOX ) injection  40 mg Subcutaneous Q24H   feeding supplement (GLUCERNA SHAKE)  237 mL Oral TID BM   fluconazole   100 mg Oral Daily   insulin  aspart  2 Units Subcutaneous TID WC   insulin  glargine  12 Units Subcutaneous Daily   ipratropium-albuterol   3 mL Nebulization BID   nystatin   5 mL Oral QID   umeclidinium-vilanterol  1 puff Inhalation Daily   Continuous Infusions:     LOS: 3 days   Ivonne Mustache, MD [facility] P9/18/2025, 11:29 AM

## 2024-09-13 NOTE — NC FL2 (Signed)
 [place]  MEDICAID FL2 LEVEL OF CARE FORM     IDENTIFICATION  Patient Name: Lindsey Williamson Birthdate: 1951/10/09 Sex: female Admission Date (Current Location): 09/10/2024  [facility] and [place] Number:  Producer, television/film/video and Address:  [facility],  [address], [place] [address]      Provider Number:    Attending Physician Name and Address:  Jillian Buttery, MD  Relative Name and Phone Number:       Current Level of Care: Hospital Recommended Level of Care: Skilled Nursing Facility Prior Approval Number:    Date Approved/Denied:   PASRR Number:    Discharge Plan: SNF    Current Diagnoses: Patient Active Problem List   Diagnosis Date Noted   Weakness 09/11/2024   FTT (failure to thrive) in adult 09/10/2024   Hypercalcemia 09/10/2024   Hyponatremia 09/10/2024   Normocytic anemia 09/10/2024   Malignant pleural effusion 09/10/2024   Pleural effusion on right 08/22/2024   COPD exacerbation (HCC) 08/16/2024   Hypomagnesemia 08/16/2024   Prolonged QT interval 08/16/2024   Ectopic atrial tachycardia (HCC) 08/16/2024   Pneumonitis 04/06/2024   Acute on chronic respiratory failure with hypoxia (HCC) 04/06/2024   ILD (interstitial lung disease) (HCC) 04/06/2024   COPD (chronic obstructive pulmonary disease) (HCC) 04/06/2024   Hypoxia 02/13/2024   Encounter for antineoplastic immunotherapy 02/09/2024   Encounter for antineoplastic chemotherapy 11/15/2023   Goals of care, counseling/discussion 10/25/2023   Angioedema 10/17/2023   Bleeding per rectum 07/30/2022   Diverticular disease of colon 07/30/2022   First degree hemorrhoids 07/30/2022   Hyperglycemia due to type 2 diabetes mellitus (HCC) 07/30/2022   Primary cancer of right lower lobe of lung (HCC) 04/08/2022   Lung nodule 03/22/2022   Dizziness 05/19/2020   Allergic rhinitis with a predominant nonallergic component. 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   Left foot pain 08/24/2019   Nasal turbinate hypertrophy 10/16/2018   Trigeminal neuralgia 10/16/2018   Chronic nonintractable headache 09/25/2018   Sinusitis 09/25/2018   Arthritis 11/11/2017   Osteopenia 05/09/2017   Atrophic vaginitis 11/10/2016   Type 2 diabetes mellitus with microalbuminuria (HCC) 04/23/2015   Tobacco abuse 12/16/2014   Hyperlipidemia 12/12/2013   Hypertension 12/12/2013    Orientation RESPIRATION BLADDER Height & Weight     Self, Time, Situation, Place  Normal Incontinent, External catheter Weight: 64.5 kg Height:  5' 4 (162.6 cm)  BEHAVIORAL SYMPTOMS/MOOD NEUROLOGICAL BOWEL NUTRITION STATUS      Continent Diet  AMBULATORY STATUS COMMUNICATION OF NEEDS Skin   Extensive Assist Verbally Normal                       Personal Care Assistance Level of Assistance  Bathing, Feeding, Dressing Bathing Assistance: Limited assistance Feeding assistance: Limited assistance Dressing Assistance: Limited assistance     Functional Limitations Info             SPECIAL CARE FACTORS FREQUENCY  PT (By licensed PT), OT (By licensed OT)     PT Frequency: 5X weekly OT Frequency: 5X weekly            Contractures Contractures Info: Not present    Additional Factors Info  Code Status, Allergies, Insulin  Sliding Scale Code Status Info: DNR Limited Allergies Info: Benzonatate, Lisinopril, Aspirin , Azithromycin , Codeine    Insulin  Sliding Scale Info: BG <150= 0 unit; BG 150-200= 1 unit; BG 201-250= 3 unit; BG 251-300= 5 unit;  BG 301-350= 7 unit; BG 351-400= 9 unit; BG >400= 11 unit       Current Medications (09/13/2024):  This is the current hospital active medication list Current Facility-Administered Medications  Medication Dose Route Frequency Provider Last Rate Last Admin   acetaminophen  (TYLENOL ) tablet 650 mg  650 mg Oral Q6H PRN Krishnan, Gokul, MD   650 mg at 09/12/24 1723   Or   acetaminophen   (TYLENOL ) suppository 650 mg  650 mg Rectal Q6H PRN Krishnan, Gokul, MD       bisoprolol  (ZEBETA ) tablet 10 mg  10 mg Oral Daily Krishnan, Gokul, MD   10 mg at 09/12/24 0844   cefTRIAXone  (ROCEPHIN ) 1 g in sodium chloride  0.9 % 100 mL IVPB  1 g Intravenous Q24H Jillian Buttery, MD 200 mL/hr at 09/13/24 0353 Infusion Verify at 09/13/24 0353   enoxaparin  (LOVENOX ) injection 40 mg  40 mg Subcutaneous Q24H Krishnan, Gokul, MD   40 mg at 09/12/24 2125   feeding supplement (GLUCERNA SHAKE) (GLUCERNA SHAKE) liquid 237 mL  237 mL Oral TID BM Dean Clarity, MD   237 mL at 09/12/24 1925   fluconazole  (DIFLUCAN ) tablet 100 mg  100 mg Oral Daily Krishnan, Gokul, MD   100 mg at 09/12/24 0847   guaiFENesin  (ROBITUSSIN) 100 MG/5ML liquid 5 mL  5 mL Oral Q4H PRN Krishnan, Gokul, MD   5 mL at 09/12/24 2137   insulin  aspart (novoLOG ) injection 2 Units  2 Units Subcutaneous TID WC Adhikari, Amrit, MD   2 Units at 09/13/24 0806   insulin  glargine (LANTUS ) injection 12 Units  12 Units Subcutaneous Daily Adhikari, Amrit, MD       ipratropium-albuterol  (DUONEB) 0.5-2.5 (3) MG/3ML nebulizer solution 3 mL  3 mL Nebulization BID Krishnan, Gokul, MD   3 mL at 09/12/24 1943   nystatin  (MYCOSTATIN ) 100000 UNIT/ML suspension 500,000 Units  5 mL Oral QID Krishnan, Gokul, MD   500,000 Units at 09/12/24 2125   oxyCODONE  (Oxy IR/ROXICODONE ) immediate release tablet 5 mg  5 mg Oral Q4H PRN Krishnan, Gokul, MD   5 mg at 09/12/24 2136   prochlorperazine  (COMPAZINE ) injection 10 mg  10 mg Intravenous Q4H PRN Krishnan, Gokul, MD       umeclidinium-vilanterol (ANORO ELLIPTA ) 62.5-25 MCG/ACT 1 puff  1 puff Inhalation Daily Krishnan, Gokul, MD   1 puff at 09/12/24 1021     Discharge Medications: Please see discharge summary for a list of discharge medications.  Relevant Imaging Results:  Relevant Lab Results:   Additional Information SSN  [id number]  Doneta Glenys DASEN, RN

## 2024-09-13 NOTE — Plan of Care (Signed)

## 2024-09-13 NOTE — TOC PASRR Note (Signed)
 30 Day PASRR Note   Patient Details  Name: Lindsey Williamson Date of Birth: 25-Jun-1951   Transition of Care [facility]) CM/SW Contact:    Doneta Glenys DASEN, RN Phone Number: 09/13/2024, 8:54 AM  To Whom It May Concern:  Please be advised that this patient will require a short-term nursing home stay - anticipated 30 days or less for rehabilitation and strengthening.   The plan is for return home.

## 2024-09-14 DIAGNOSIS — R627 Adult failure to thrive: Secondary | ICD-10-CM | POA: Diagnosis not present

## 2024-09-14 LAB — GLUCOSE, CAPILLARY
Glucose-Capillary: 224 mg/dL — ABNORMAL HIGH (ref 70–99)
Glucose-Capillary: 282 mg/dL — ABNORMAL HIGH (ref 70–99)
Glucose-Capillary: 337 mg/dL — ABNORMAL HIGH (ref 70–99)

## 2024-09-14 LAB — BODY FLUID CULTURE W GRAM STAIN
Culture: NO GROWTH
Gram Stain: NONE SEEN

## 2024-09-14 MED ORDER — DILTIAZEM HCL ER COATED BEADS 120 MG PO CP24
120.0000 mg | ORAL_CAPSULE | Freq: Every day | ORAL | Status: DC
  Administered 2024-09-14: 120 mg via ORAL
  Filled 2024-09-14: qty 1

## 2024-09-14 MED ORDER — SENNOSIDES-DOCUSATE SODIUM 8.6-50 MG PO TABS: 1.0000 | ORAL_TABLET | Freq: Two times a day (BID) | ORAL | Status: DC

## 2024-09-14 MED ORDER — POLYETHYLENE GLYCOL 3350 17 G PO PACK: 17.0000 g | PACK | Freq: Every day | ORAL | Status: DC

## 2024-09-14 MED ORDER — METFORMIN HCL 1000 MG PO TABS: 1000.0000 mg | ORAL_TABLET | Freq: Two times a day (BID) | ORAL | Status: DC

## 2024-09-14 NOTE — Plan of Care (Signed)

## 2024-09-14 NOTE — Progress Notes (Signed)
 Physical Therapy Treatment Patient Details Name: Lindsey Williamson MRN: [id number] DOB: 04-07-1951 Today's Date: 09/14/2024   History of Present Illness Patient is a 76 female presented 09/10/24 with complaint of feeling weak, fatigued, lightheadedness. Chest x-ray showed reaccumulation of the right pleural effusion. S/P thoracentesis. PMH: right lung cancer, recurrent right-sided malignant pleural effusion, ILD, COPD, chronic hypoxic respiratory failure on 3 L of oxygen at home, diabetes type 2, coronary artery disease, recurrent admission for right-sided pleural effusion, currently on chemotherapy for lung cancer    PT Comments   Pt admitted with above diagnosis. Pt currently with functional limitations due to the deficits listed below (see PT Problem List). Pt seated in recliner when PT arrived. Pt agreeable to therapy. Pt reports abdominal pain today secondary to constipation. SOB with exertion, no dizziness. Pt required min cues and CGA for sit to stand  from recliner, CGA for gait tasks 80 feet with RW in hallway and cues for coordinated breathing with pt on 2 L/min throughout intervention and pt saturating 94% with exertion. Pt left seated in recliner, all needs in place. Pt to transition to SNF for continued therapy services to maximize IND and safety with functional mobility tasks prior to returning home. Pt will benefit from acute skilled PT to increase their independence and safety with mobility to allow discharge.      If plan is discharge home, recommend the following: A little help with walking and/or transfers;A little help with bathing/dressing/bathroom;Assistance with cooking/housework;Assist for transportation;Help with stairs or ramp for entrance   Can travel by private vehicle     Yes  Equipment Recommendations  None recommended by PT    Recommendations for Other Services       Precautions / Restrictions Precautions Precautions: Fall Precaution/Restrictions Comments:  monitor O2 Restrictions Weight Bearing Restrictions Per Provider Order: No Other Position/Activity Restrictions: O2 use at baseline     Mobility  Bed Mobility               General bed mobility comments: pt seated in recliner whe PT arrived    Transfers Overall transfer level: Needs assistance Equipment used: Rolling walker (2 wheels) Transfers: Sit to/from Stand Sit to Stand: Contact guard assist           General transfer comment: min cues for UE placement and pt able to push to stand    Ambulation/Gait Ambulation/Gait assistance: +2 safety/equipment, Contact guard assist Gait Distance (Feet): 80 Feet Assistive device: Rolling walker (2 wheels) Gait Pattern/deviations: Step-through pattern Gait velocity: decreased     General Gait Details: min cues for posture and coordinated breathing, pt required 3 standing therapeutic rest breaks due to reports of SOB and pt on 2 L/min and 94%   Stairs             Wheelchair Mobility     Tilt Bed    Modified Rankin (Stroke Patients Only)       Balance Overall balance assessment: Mild deficits observed, not formally tested Sitting-balance support: No upper extremity supported, Feet supported Sitting balance-Leahy Scale: Good     Standing balance support: Bilateral upper extremity supported, During functional activity, Reliant on assistive device for balance Standing balance-Leahy Scale: Poor Standing balance comment: CGA with B UE support at [employer]                            Communication Communication Communication: No apparent difficulties  Cognition Arousal: Alert Behavior During Therapy: [facility]  for tasks assessed/performed   PT - Cognitive impairments: No apparent impairments                         Following commands: Intact      Cueing Cueing Techniques: Verbal cues  Exercises      General Comments        Pertinent Vitals/Pain Pain Assessment Pain Assessment:  Faces Faces Pain Scale: Hurts little more Pain Location: abdomen Pain Descriptors / Indicators: Sore Pain Intervention(s): Monitored during session    Home Living                          Prior Function            PT Goals (current goals can now be found in the care plan section) Acute Rehab PT Goals Patient Stated Goal: Regain IND PT Goal Formulation: With patient Time For Goal Achievement: 09/26/24 Potential to Achieve Goals: Good Progress towards PT goals: Progressing toward goals    Frequency    Min 2X/week      PT Plan      Co-evaluation              AM-PAC PT 6 Clicks Mobility   Outcome Measure  Help needed turning from your back to your side while in a flat bed without using bedrails?: A Little Help needed moving from lying on your back to sitting on the side of a flat bed without using bedrails?: A Little Help needed moving to and from a bed to a chair (including a wheelchair)?: A Little Help needed standing up from a chair using your arms (e.g., wheelchair or bedside chair)?: A Little Help needed to walk in hospital room?: A Little Help needed climbing 3-5 steps with a railing? : A Lot 6 Click Score: 17    End of Session Equipment Utilized During Treatment: Gait belt;Oxygen Activity Tolerance: Patient limited by fatigue Patient left: in chair;with call bell/phone within reach Nurse Communication: Mobility status PT Visit Diagnosis: Difficulty in walking, not elsewhere classified (R26.2)     Time: 8669-8641 PT Time Calculation (min) (ACUTE ONLY): 28 min  Charges:    $Gait Training: 8-22 mins $Therapeutic Activity: 8-22 mins PT General Charges $$ ACUTE PT VISIT: 1 Visit                     [place], PT Acute Rehab    [place] VEAR Drone 09/14/2024, 2:17 PM

## 2024-09-14 NOTE — Plan of Care (Signed)

## 2024-09-14 NOTE — TOC Progression Note (Addendum)
 Transition of Care [facility]) - Progression Note    Patient Details  Name: Lindsey Williamson MRN: [id number] Date of Birth: [birth year]  Transition of Care [facility]) CM/SW Contact  Toy LITTIE Agar, RN Phone Number:[phone]  09/14/2024, 1:55 PM  Clinical Narrative:    PASRR# received [id number]  1410 Cm has confirmed choice with patient. Patient wants [facility] for rehab. CM has confirmed with Whitney in admission that patient is able to admit to [facility] today. Message has been sent to MD. Awaiting final confirmation that patient will be able to discharge today.    Expected Discharge Plan: Home w Home Health Services Barriers to Discharge: Continued Medical Work up               Expected Discharge Plan and Services In-house Referral: NA Discharge Planning Services: CM Consult   Living arrangements for the past 2 months: Apartment                 DME Arranged: N/A DME Agency: NA       HH Arranged: NA HH Agency: NA         Social Drivers of Health (SDOH) Interventions SDOH Screenings   Food Insecurity: No Food Insecurity (09/10/2024)  Housing: Low Risk  (09/10/2024)  Transportation Needs: No Transportation Needs (09/10/2024)  Utilities: Not At Risk (09/10/2024)  Depression (PHQ2-9): Low Risk  (09/04/2024)  Financial Resource Strain: Low Risk  (04/24/2024)   Received from [facility]  Physical Activity: Inactive (04/24/2024)   Received from [facility]  Social Connections: Moderately Integrated (09/10/2024)  Stress: Stress Concern Present (04/24/2024)   Received from [facility]  Tobacco Use: Medium Risk (09/10/2024)    Readmission Risk Interventions    09/12/2024   11:45 AM 08/20/2024   10:10 AM 02/18/2024    9:51 AM  Readmission Risk Prevention Plan  Transportation Screening Complete Complete Complete  PCP or Specialist Appt within 3-5 Days   Complete  HRI or Home Care Consult   Complete  Social Work Consult for Recovery Care Planning/Counseling    Complete  Palliative Care Screening   Not Applicable  Medication Review Oceanographer) Complete Complete Complete  PCP or Specialist appointment within 3-5 days of discharge Complete Complete   HRI or Home Care Consult Complete Complete   SW Recovery Care/Counseling Consult Complete Complete   Palliative Care Screening Patient Refused Not Applicable   Skilled Nursing Facility Not Applicable Not Applicable

## 2024-09-14 NOTE — Progress Notes (Signed)
 [facility] CSW Progress Note  Visual merchandiser met with patient to follow-up on emotional support.    Interventions: Pt originally scheduled for virtual counseling; however, was admitted to the hospital.  CSW met w/ pt inpatient to provide emotional support.  Pt w/ recent decline in functional status and progression of disease.  Space provided for pt to express her feelings regarding the current circumstances.  Pt's husband joined the conversation by speaker phone.  Pt verbalized understanding that her cancer has progressed.  Pt would like to go to rehab to regain any physical strength she may be able to and will decide if she will pursue additional treatment post discharge from rehab if she is accepted for transfer.        Follow Up Plan:  Patient will contact CSW with any support or resource needs    Devere JONELLE Manna, LCSW Clinical Social Worker [facility]

## 2024-09-14 NOTE — TOC Transition Note (Addendum)
 Transition of Care [facility]) - Discharge Note   Patient Details  Name: Lindsey Williamson MRN: [id number] Date of Birth: Nov 30, 1951  Transition of Care [facility]) CM/SW Contact:  Toy LITTIE Agar, RN Phone [phone]  09/14/2024, 3:13 PM   Clinical Narrative:    D/c summary and transfer report have been faxed to [facility]. Cm has confirmed with Whitney that patient can d/c to facility. Transportation has been arranged via [facility]. Husband has been made aware. Discharge packet is at nurses station.   Please call report to:  [facility] [phone]      Barriers to Discharge: Continued Medical Work up   Patient Goals and CMS Choice Patient states their goals for this hospitalization and ongoing recovery are:: Home with [facility] CMS Medicare.gov Compare Post Acute Care list provided to::  (NA) Choice offered to / list presented to : NA [place] ownership interest in [facility].provided to:: Parent NA    Discharge Placement PASRR number recieved: 09/14/24 [phone] E)                     Discharge Plan and Services Additional resources added to the After Visit Summary for   In-house Referral: NA Discharge Planning Services: CM Consult            DME Arranged: N/A DME Agency: NA       HH Arranged: NA HH Agency: NA        Social Drivers of Health (SDOH) Interventions SDOH Screenings   Food Insecurity: No Food Insecurity (09/10/2024)  Housing: Low Risk  (09/10/2024)  Transportation Needs: No Transportation Needs (09/10/2024)  Utilities: Not At Risk (09/10/2024)  Depression (PHQ2-9): Low Risk  (09/04/2024)  Financial Resource Strain: Low Risk  (04/24/2024)   Received from [facility]  Physical Activity: Inactive (04/24/2024)   Received from [facility]  Social Connections: Moderately Integrated (09/10/2024)  Stress: Stress Concern Present (04/24/2024)   Received from [facility]  Tobacco Use: Medium Risk (09/10/2024)     Readmission Risk  Interventions    09/12/2024   11:45 AM 08/20/2024   10:10 AM 02/18/2024    9:51 AM  Readmission Risk Prevention Plan  Transportation Screening Complete Complete Complete  PCP or Specialist Appt within 3-5 Days   Complete  HRI or Home Care Consult   Complete  Social Work Consult for Recovery Care Planning/Counseling   Complete  Palliative Care Screening   Not Applicable  Medication Review Oceanographer) Complete Complete Complete  PCP or Specialist appointment within 3-5 days of discharge Complete Complete   HRI or Home Care Consult Complete Complete   SW Recovery Care/Counseling Consult Complete Complete   Palliative Care Screening Patient Refused Not Applicable   Skilled Nursing Facility Not Applicable Not Applicable

## 2024-09-14 NOTE — Progress Notes (Signed)
 PROGRESS NOTE  Lindsey Williamson  FMW:[id number] DOB: 07/31/1951 DOA: 09/10/2024 PCP: [name], MD   Brief Narrative: Patient is a 53 female with history of right lung cancer, recurrent right-sided malignant pleural effusion, ILD, COPD, chronic hypoxic respiratory failure on 3 L of oxygen at home, diabetes type 2, coronary artery disease, recurrent admission for right-sided pleural effusion, currently on chemotherapy for lung cancer presented with complaint of feeling weak, fatigued, lightheadedness.  Chest x-ray showed reaccumulation of the right pleural effusion.  PCCM consulted.  Patient was offered PleurX catheter placement but she declined saying poor support at home.  S/p thoracentesis on 09/11/2024.  Patient interested on being placed at SNF.  Medically stable for discharge whenever possible.  TOC following  Assessment & Plan:  Principal Problem:   FTT (failure to thrive) in adult Active Problems:   Primary cancer of right lower lobe of lung (HCC)   ILD (interstitial lung disease) (HCC)   Type 2 diabetes mellitus with microalbuminuria (HCC)   COPD (chronic obstructive pulmonary disease) (HCC)   Hypomagnesemia   Ectopic atrial tachycardia (HCC)   Hypercalcemia   Hyponatremia   Normocytic anemia   Malignant pleural effusion   Weakness  Generalized weakness/fatigue: Multifactorial, secondary to chemotherapy, malignancy, right pleural effusion, dehydration.  Appeared to be dehydrated, hypotensive on presentation.  Given IV fluid.  PT/OT evaluation done.  Patient is interested on SNF placement.  Patient lives with her husband.  Has to climb stairs to go to her room  Recurrent right-sided pleural effusion: Had thoracentesis multiple times.  Malignant pleural effusion.PCCM consulted.  Patient was offered PleurX catheter placement but she declined saying poor support at home.  S/p thoracentesis on 09/11/2024.  Suspected UTI: UA was suspicious for UTI.  She complained of dysuria on  presentation.  Started on ceftriaxone .  Urine culture showed multiple species.  Completed 3 days course of ceftriaxone   History of right lung cancer: Currently on chemotherapy.  Follows with Dr. Sherrod.  Chronic hypoxic respiratory failure/ILD/COPD: Chronically on 3 L of oxygen per minute.  Currently not on exacerbation  Normocytic anemia: Given a unit of blood transfusion during this hospitalization.  Currently hemoglobin is stable  Oral candidiasis: Continue nystatin /Diflucan   History of ectopic atrial tachycardia: On bisoprolol .  Seen by cardiology on last hospitalization  Diabetes type 2: A1c of 9.3.  On glipizide , metformin  at home.  Currently on insulin  and sliding scale  Hypertension: On bisoprolol .  Restarted home Cardizem  at low-dose  Hypomagnesemia: Supplemented with magnesium   Goals of care: Very deconditioned female with history of lung cancer.  She is DNR          DVT prophylaxis:enoxaparin  (LOVENOX ) injection 40 mg Start: 09/10/24 2200     Code Status: Limited: Do not attempt resuscitation (DNR) -DNR-LIMITED -Do Not Intubate/DNI   Family Communication: Patient says no need to call anybody from her family  Patient status:Inpatient  Patient is from :Home  Anticipated discharge to:SNF  Estimated DC date:whenever possible   Consultants: PCCM  Procedures:Thoracentesis  Antimicrobials:  Anti-infectives (From admission, onward)    Start     Dose/Rate Route Frequency Ordered Stop   09/11/24 1000  cefTRIAXone  (ROCEPHIN ) 1 g in sodium chloride  0.9 % 100 mL IVPB        1 g 200 mL/hr over 30 Minutes Intravenous Every 24 hours 09/11/24 0903 09/13/24 1054   09/10/24 1815  fluconazole  (DIFLUCAN ) tablet 100 mg        100 mg Oral Daily 09/10/24 1801 09/14/24 1011  Subjective: Patient seen and examined at bedside today.  Hemodynamically stable.  Lying in bed.  On baseline oxygen requirement.  Denies new complaints.  She says she feels good.  Appears weak  and deconditioned and has some cough.  No new complaints  Objective: Vitals:   09/13/24 2059 09/13/24 2144 09/14/24 0607 09/14/24 0950  BP:  (!) 157/89 133/84   Pulse:  (!) 106 (!) 101   Resp:  18 16   Temp:  99.4 F (37.4 C) 98.5 F (36.9 C)   TempSrc:  Oral Oral   SpO2: 99% 96% 98% 100%  Weight:      Height:        Intake/Output Summary (Last 24 hours) at 09/14/2024 1143 Last data filed at 09/14/2024 0602 Gross per 24 hour  Intake 240 ml  Output 850 ml  Net -610 ml   Filed Weights   09/10/24 2006  Weight: 64.5 kg    Examination:    General exam: Overall comfortable, not in distress, lying in bed, appears weak and deconditioned HEENT: PERRL Respiratory system: Bilateral diminished air sounds, bilateral crackles Cardiovascular system: S1 & S2 heard, RRR.  Gastrointestinal system: Abdomen is nondistended, soft and nontender. Central nervous system: Alert and oriented Extremities: No edema, no clubbing ,no cyanosis Skin: No rashes, no ulcers,no icterus     Data Reviewed: I have personally reviewed following labs and imaging studies  CBC: Recent Labs  Lab 09/10/24 1550 09/11/24 0532 09/11/24 1718 09/12/24 0549 09/13/24 0541  WBC 7.2 5.0  --  5.7 4.9  NEUTROABS 5.8  --   --  4.4 3.4  HGB 9.4* 7.7* 9.7* 8.3* 8.4*  HCT 33.2* 27.6* 34.2* 28.0* 28.8*  MCV 84.1 86.3  --  86.2 85.5  PLT 497* 331  --  298 289   Basic Metabolic Panel: Recent Labs  Lab 09/10/24 1550 09/11/24 0532 09/12/24 0549 09/13/24 0541  NA 132* 132* 134* 133*  K 4.1 4.0 3.9 3.6  CL 94* 98 102 101  CO2 29 22 20* 21*  GLUCOSE 193* 243* 265* 203*  BUN 20 14 9  5*  CREATININE 0.53 0.38* 0.37* 0.34*  CALCIUM  12.2* 10.4* 8.9 9.2  MG 1.4* 1.9 1.4*  --      Recent Results (from the past 240 hours)  Urine Culture (for pregnant, neutropenic or urologic patients or patients with an indwelling urinary catheter)     Status: Abnormal   Collection Time: 09/10/24  9:07 AM   Specimen: Urine,  Clean Catch  Result Value Ref Range Status   Specimen Description   Final    URINE, CLEAN CATCH Performed at [facility], [address], [place], [place] [address]    Special Requests   Final    NONE Performed at [facility], [address], [place], [place] [address]    Culture MULTIPLE SPECIES PRESENT, SUGGEST RECOLLECTION (A)  Final   Report Status 09/12/2024 FINAL  Final  Resp panel by RT-PCR (RSV, Flu A&B, Covid) Anterior Nasal Swab     Status: None   Collection Time: 09/10/24  3:36 PM   Specimen: Anterior Nasal Swab  Result Value Ref Range Status   SARS Coronavirus 2 by RT PCR NEGATIVE NEGATIVE Final    Comment: (NOTE) SARS-CoV-2 target nucleic acids are NOT DETECTED.  The SARS-CoV-2 RNA is generally detectable in upper respiratory specimens during the acute phase of infection. The lowest concentration of SARS-CoV-2 viral copies this assay can detect is 138 copies/mL. A negative  result does not preclude SARS-Cov-2 infection and should not be used as the sole basis for treatment or other patient management decisions. A negative result may occur with  improper specimen collection/handling, submission of specimen other than nasopharyngeal swab, presence of viral mutation(s) within the areas targeted by this assay, and inadequate number of viral copies(<138 copies/mL). A negative result must be combined with clinical observations, patient history, and epidemiological information. The expected result is Negative.  Fact Sheet for Patients:  BloggerCourse.com  Fact Sheet for Healthcare Providers:  SeriousBroker.it  This test is no t yet approved or cleared by the [place]  FDA and  has been authorized for detection and/or diagnosis of SARS-CoV-2 by FDA under an Emergency Use Authorization (EUA). This EUA will remain  in effect (meaning this test can be used) for the duration of  the COVID-19 declaration under Section 564(b)(1) of the Act, 21 U.S.C.section 360bbb-3(b)(1), unless the authorization is terminated  or revoked sooner.       Influenza A by PCR NEGATIVE NEGATIVE Final   Influenza B by PCR NEGATIVE NEGATIVE Final    Comment: (NOTE) The Xpert Xpress SARS-CoV-2/FLU/RSV plus assay is intended as an aid in the diagnosis of influenza from Nasopharyngeal swab specimens and should not be used as a sole basis for treatment. Nasal washings and aspirates are unacceptable for Xpert Xpress SARS-CoV-2/FLU/RSV testing.  Fact Sheet for Patients: BloggerCourse.com  Fact Sheet for Healthcare Providers: SeriousBroker.it  This test is not yet approved or cleared by the [place]  FDA and has been authorized for detection and/or diagnosis of SARS-CoV-2 by FDA under an Emergency Use Authorization (EUA). This EUA will remain in effect (meaning this test can be used) for the duration of the COVID-19 declaration under Section 564(b)(1) of the Act, 21 U.S.C. section 360bbb-3(b)(1), unless the authorization is terminated or revoked.     Resp Syncytial Virus by PCR NEGATIVE NEGATIVE Final    Comment: (NOTE) Fact Sheet for Patients: BloggerCourse.com  Fact Sheet for Healthcare Providers: SeriousBroker.it  This test is not yet approved or cleared by the [place]  FDA and has been authorized for detection and/or diagnosis of SARS-CoV-2 by FDA under an Emergency Use Authorization (EUA). This EUA will remain in effect (meaning this test can be used) for the duration of the COVID-19 declaration under Section 564(b)(1) of the Act, 21 U.S.C. section 360bbb-3(b)(1), unless the authorization is terminated or revoked.  Performed at [facility], [address], [place], [place] [address]   Pleural fluid culture w Gram Stain     Status: None    Collection Time: 09/11/24 11:03 AM   Specimen: Pleural Fluid  Result Value Ref Range Status   Specimen Description   Final    PLEURAL Performed at [facility], [address] [place], [place] [address]    Special Requests   Final    Immunocompromised Performed at [facility], [address] [place], [place] [address]    Gram Stain NO WBC SEEN NO ORGANISMS SEEN   Final   Culture   Final    NO GROWTH 3 DAYS Performed at [facility], [address] [place], [place] [address]    Report Status 09/14/2024 FINAL  Final     Radiology Studies: No results found.   Scheduled Meds:  bisacodyl   10 mg Rectal Once   bisoprolol   10 mg Oral Daily   enoxaparin  (LOVENOX ) injection  40 mg Subcutaneous Q24H   feeding supplement (GLUCERNA SHAKE)  237 mL Oral TID BM   insulin  aspart  2 Units Subcutaneous TID WC   insulin  glargine  12 Units Subcutaneous Daily   ipratropium-albuterol   3 mL Nebulization BID   nystatin   5 mL Oral QID   polyethylene glycol  17 g Oral Daily   senna-docusate  1 tablet Oral BID   umeclidinium-vilanterol  1 puff Inhalation Daily   Continuous Infusions:     LOS: 4 days   Ivonne Mustache, MD [facility] P9/19/2025, 11:43 AM

## 2024-09-14 NOTE — Discharge Summary (Addendum)
 Physician Discharge Summary  Lindsey Williamson FMW:[id number] DOB: Feb 19, 1951 DOA: 09/10/2024  PCP: [name], MD  Admit date: 09/10/2024 Discharge date: 09/14/2024  Admitted From: Home Disposition:  SnF  Discharge Condition:Stable CODE STATUS: DNR Diet recommendation: Carb Modified  Brief/Interim Summary: Patient is a 53 female with history of right lung cancer, recurrent right-sided malignant pleural effusion, ILD, COPD, chronic hypoxic respiratory failure on 3 L of oxygen at home, diabetes type 2, coronary artery disease, recurrent admission for right-sided pleural effusion, currently on chemotherapy for lung cancer presented with complaint of feeling weak, fatigued, lightheadedness.  Chest x-ray showed reaccumulation of the right pleural effusion.  PCCM consulted.  Patient was offered PleurX catheter placement but she declined saying poor support at home.  S/p thoracentesis on 09/11/2024.  Currently hemodynamically stable.  PT recommended SNF.  Medically stable for discharge whenever possible.  TOC following    Following problems were addressed during the hospitalization:  Generalized weakness/fatigue: Multifactorial, secondary to chemotherapy, malignancy, right pleural effusion, dehydration.  Appeared to be dehydrated, hypotensive on presentation.  Given IV fluid.  PT/OT evaluation done.  Patient is interested on SNF placement.  Patient lives with her husband.    Recurrent right-sided pleural effusion: Had thoracentesis multiple times.  Malignant pleural effusion.PCCM consulted.  Patient was offered PleurX catheter placement but she declined saying poor support at home.  S/p thoracentesis on 09/11/2024.  Needs follow-up with pulmonology as an outpatient.   Suspected UTI: UA was suspicious for UTI.  She complained of dysuria on presentation.  Started on ceftriaxone .  Urine culture showed multiple species.  Completed 3 days course of ceftriaxone    History of right lung cancer: Currently  on chemotherapy.  Follows with Dr. Sherrod.   Chronic hypoxic respiratory failure/ILD/COPD: Chronically on 3 L of oxygen per minute.  Currently not on exacerbation   Normocytic anemia: Given a unit of blood transfusion during this hospitalization.  Currently hemoglobin is stable.  Check CBC in a week   Oral candidiasis: Completed the course of fluconazole    History of ectopic atrial tachycardia: On bisoprolol .  Seen by cardiology on last hospitalization   Diabetes type 2: A1c of 9.3.  On glipizide , metformin ,sliding scale   at home   Hypertension: On bisoprolol .  Restarted home Cardizem     Goals of care: Very deconditioned female with history of lung cancer.  She is DNR    Discharge Diagnoses:  Principal Problem:   FTT (failure to thrive) in adult Active Problems:   Primary cancer of right lower lobe of lung (HCC)   ILD (interstitial lung disease) (HCC)   Type 2 diabetes mellitus with microalbuminuria (HCC)   COPD (chronic obstructive pulmonary disease) (HCC)   Hypomagnesemia   Ectopic atrial tachycardia (HCC)   Hypercalcemia   Hyponatremia   Normocytic anemia   Malignant pleural effusion   Weakness    Discharge Instructions  Discharge Instructions     Diet Carb Modified   Complete by: As directed    Discharge instructions   Complete by: As directed    1)Please take your medications as instructed 2)Do a CBC and BMP tests in a week 3)Follow up with your oncologist as an outpatient   Increase activity slowly   Complete by: As directed       Allergies as of 09/14/2024       Reactions   Benzonatate Palpitations   Lisinopril Swelling, Other (See Comments)   Patient ended up in the ED with a swollen mouth and tongue  Aspirin  Nausea And Vomiting, Palpitations, Other (See Comments)   Can tolerate baby aspirin  81 mg without difficulty, but can't tolerate higher dosages   Azithromycin  Palpitations   Codeine  Palpitations, Other (See Comments)   Patient states this  is not an allergy         Medication List     STOP taking these medications    guaiFENesin -codeine  100-10 MG/5ML syrup   metFORMIN  500 MG 24 hr tablet Commonly known as: GLUCOPHAGE -XR Replaced by: metFORMIN  1000 MG tablet       TAKE these medications    albuterol  108 (90 Base) MCG/ACT inhaler Commonly known as: VENTOLIN  HFA Inhale 2 puffs into the lungs every 6 (six) hours as needed for wheezing or shortness of breath.   aspirin  EC 81 MG tablet Take 81 mg by mouth daily. Swallow whole.   bisoprolol  10 MG tablet Commonly known as: ZEBETA  Take 1 tablet (10 mg total) by mouth daily.   cetirizine  10 MG tablet Commonly known as: ZyrTEC  Allergy  Take 1 tablet (10 mg total) by mouth daily.   diltiazem  360 MG 24 hr capsule Commonly known as: Cardizem  CD Take 1 capsule (360 mg total) by mouth daily.   empagliflozin  25 MG Tabs tablet Commonly known as: JARDIANCE  Take 25 mg by mouth daily at 6 PM.   ferrous sulfate  325 (65 FE) MG tablet Take 325 mg by mouth daily with breakfast.   glipiZIDE  5 MG 24 hr tablet Commonly known as: GLUCOTROL  XL Take 1 tablet (5 mg total) by mouth daily with breakfast.   ipratropium-albuterol  0.5-2.5 (3) MG/3ML Soln Commonly known as: DUONEB Take 3 mLs by nebulization in the morning and at bedtime.   lidocaine -prilocaine  cream Commonly known as: EMLA  Apply to affected area once   metFORMIN  1000 MG tablet Commonly known as: GLUCOPHAGE  Take 1 tablet (1,000 mg total) by mouth 2 (two) times daily with a meal. Replaces: metFORMIN  500 MG 24 hr tablet   NovoLOG  FlexPen 100 UNIT/ML FlexPen Generic drug: insulin  aspart Inject 0-11 Units into the skin 3 (three) times daily with meals. Check Blood Glucose (BG) and inject per scale: BG <150= 0 unit; BG 150-200= 1 unit; BG 201-250= 3 unit; BG 251-300= 5 unit; BG 301-350= 7 unit; BG 351-400= 9 unit; BG >400= 11 unit and Call Primary Care. What changed:  when to take this reasons to take this    nystatin  100000 UNIT/ML suspension Commonly known as: MYCOSTATIN  Take 5 mLs (500,000 Units total) by mouth 4 (four) times daily.   ondansetron  8 MG tablet Commonly known as: Zofran  Take 1 tablet (8 mg total) by mouth every 8 (eight) hours as needed for nausea or vomiting. Start on the third day after carboplatin .   PEG 3350  17 g Pack Take 17 g packet by mouth daily.   prochlorperazine  10 MG tablet Commonly known as: COMPAZINE  Take 1 tablet (10 mg total) by mouth every 6 (six) hours as needed. What changed: reasons to take this   senna-docusate 8.6-50 MG tablet Commonly known as: Senokot-S Take 1 tablet by mouth 2 (two) times daily.   umeclidinium-vilanterol 62.5-25 MCG/ACT Aepb Commonly known as: Anoro Ellipta  Inhale 1 puff into the lungs daily.        Allergies  Allergen Reactions   Benzonatate Palpitations   Lisinopril Swelling and Other (See Comments)    Patient ended up in the ED with a swollen mouth and tongue   Aspirin  Nausea And Vomiting, Palpitations and Other (See Comments)    Can tolerate baby aspirin   81 mg without difficulty, but can't tolerate higher dosages   Azithromycin  Palpitations   Codeine  Palpitations and Other (See Comments)    Patient states this is not an allergy     Consultations: pccm   Procedures/Studies: DG Chest Port 1 View Result Date: 09/11/2024 CLINICAL DATA:  Status post right thoracentesis EXAM: PORTABLE CHEST 1 VIEW COMPARISON:  Chest radiograph dated 09/10/2024 FINDINGS: Low lung volumes. Similar diffuse interstitial and dense right basilar opacity. Decreased moderate right pleural effusion. No definite pneumothorax. Right heart border is obscured. No acute osseous abnormality. IMPRESSION: 1. Decreased moderate right pleural effusion. No definite pneumothorax. 2. Similar diffuse interstitial and dense right basilar opacity. Electronically Signed   By: Limin  Xu M.D.   On: 09/11/2024 11:14   DG Chest Port 1 View Result Date:  09/10/2024 EXAM: 1 VIEW XRAY OF THE CHEST 09/10/2024 04:25:34 PM COMPARISON: 08/30/2024 CLINICAL HISTORY: Patient brought in by [facility] for weakness that she's been having since her chemo treatment on 09/04/24. FINDINGS: LUNGS AND PLEURA: Persistent right basilar airspace opacity. Persistent diffuse interstitial prominence. Moderate right pleural effusion with right basilar atelectasis. HEART AND MEDIASTINUM: Atherosclerotic plaque. BONES AND SOFT TISSUES: No acute osseous abnormality. IMPRESSION: 1. Moderate right pleural effusion with right basilar opacities, increased from prior. 2. Similar appearance of interstitial edema. Electronically signed by: Donnice Mania MD 09/10/2024 04:46 PM EDT RP Workstation: [id number]   IR THORACENTESIS ASP PLEURAL SPACE W/IMG GUIDE Result Date: 08/30/2024 INDICATION: Patient with history of lung cancer, dyspnea, recurrent right pleural effusion; request received for diagnostic and therapeutic right thoracentesis EXAM: ULTRASOUND GUIDED DIAGNOSTIC AND THERAPEUTIC RIGHT  THORACENTESIS MEDICATIONS: 8 ml 1% lidocaine  with epinephrine  to skin/subcutaneous tissue COMPLICATIONS: None immediate. PROCEDURE: An ultrasound guided thoracentesis was thoroughly discussed with the patient and questions answered. The benefits, risks, alternatives and complications were also discussed. The patient understands and wishes to proceed with the procedure. Written consent was obtained. Ultrasound was performed to localize and mark an adequate pocket of fluid in the right chest. The area was then prepped and draped in the normal sterile fashion. 1% Lidocaine  was used for local anesthesia. Under ultrasound guidance a 6 Fr Safe-T-Centesis catheter was introduced. Thoracentesis was performed. The catheter was removed and a dressing applied. FINDINGS: A total of approximately 725 cc of slightly hazy, yellow fluid was removed. Samples were sent to the laboratory as requested by the clinical team. IMPRESSION:  Successful ultrasound guided diagnostic and therapeutic right thoracentesis yielding 725 cc of pleural fluid. Performed by: Franky Rakers, PA-C Electronically Signed   By: Juliene Balder M.D.   On: 08/30/2024 15:36   DG Chest 1 View Result Date: 08/30/2024 CLINICAL DATA:  Post right thoracentesis EXAM: CHEST  1 VIEW COMPARISON:  08/28/2024 FINDINGS: Moderate right pleural effusion again noted, slightly decreased following thoracentesis. No pneumothorax. Diffuse interstitial prominence throughout the lungs is similar prior study. Continued right lower lobe airspace opacity, favor atelectasis although pneumonia cannot be excluded. Mild cardiomegaly. Aortic atherosclerosis. No acute bony abnormality. IMPRESSION: Slight decrease in the size of the moderate right pleural effusion following thoracentesis. No pneumothorax. Continued probable interstitial edema. Right lower lobe atelectasis or pneumonia. Electronically Signed   By: Franky Crease M.D.   On: 08/30/2024 11:44   DG Chest 2 View Result Date: 08/28/2024 CLINICAL DATA:  Right pleural effusion. History of right lung cancer. EXAM: CHEST - 2 VIEW COMPARISON:  August 22, 2024 FINDINGS: Stable cardiomediastinal silhouette. Moderate size right pleural effusion is noted. Mild reticular densities are noted throughout both lungs concerning  for pulmonary edema. Bony thorax is unremarkable. IMPRESSION: Moderate size right pleural effusion. Probable mild pulmonary edema. Electronically Signed   By: Lynwood Landy Raddle M.D.   On: 08/28/2024 15:48   DG CHEST PORT 1 VIEW Result Date: 08/22/2024 CLINICAL DATA:  Respiratory failure EXAM: PORTABLE CHEST 1 VIEW COMPARISON:  08/21/2024 FINDINGS: Large right pleural effusion again noted. Continued right lower lobe airspace disease. Improving right upper lobe airspace disease. Patchy airspace disease throughout the left lung, stable. Heart likely mildly enlarged. Aortic atherosclerosis. IMPRESSION: Large right pleural effusion with some  improvement in the right upper lobe consolidation. Otherwise no change. Electronically Signed   By: Franky Crease M.D.   On: 08/22/2024 14:28   DG Chest Port 1 View Result Date: 08/21/2024 CLINICAL DATA:  Status post right thoracentesis yielding 750 cc, known lung cancer EXAM: PORTABLE CHEST 1 VIEW COMPARISON:  08/20/2024, 08/17/2019 FINDINGS: Minimal improvement in the right effusion following thoracentesis. Residual right effusion remains. Similar diffuse mixed interstitial and airspace opacities with right lower lung collapse/consolidation. No significant left effusion. No pneumothorax. Known right lower lobe central obstructing mass is obscured and better demonstrated by comparison PET-CT. No acute osseous finding. IMPRESSION: 1. Minimal improvement in the right effusion following thoracentesis. No pneumothorax. 2. Similar diffuse mixed interstitial and airspace opacities with right lower lung collapse/consolidation. Electronically Signed   By: CHRISTELLA.  Shick M.D.   On: 08/21/2024 17:28   IR THORACENTESIS ASP PLEURAL SPACE W/IMG GUIDE Result Date: 08/21/2024 INDICATION: Patient with history of lung cancer, dyspnea, recurrent right pleural effusion. Request received for therapeutic right thoracentesis. EXAM: ULTRASOUND GUIDED THERAPEUTIC RIGHT THORACENTESIS MEDICATIONS: 8 mL 1% lidocaine  with epinephrine  COMPLICATIONS: None immediate. PROCEDURE: An ultrasound guided thoracentesis was thoroughly discussed with the patient and questions answered. The benefits, risks, alternatives and complications were also discussed. The patient understands and wishes to proceed with the procedure. Written consent was obtained. Ultrasound was performed to localize and mark an adequate pocket of fluid in the right chest. The area was then prepped and draped in the normal sterile fashion. 1% Lidocaine  was used for local anesthesia. Under ultrasound guidance a 6 Fr Safe-T-Centesis catheter was introduced. Thoracentesis was performed.  The catheter was removed and a dressing applied. FINDINGS: A total of approximately 750 cc of slightly hazy, yellow fluid was removed. IMPRESSION: Successful ultrasound guided therapeutic right thoracentesis yielding 750 cc of pleural fluid. Performed by: Franky Rakers, PA-C Electronically Signed   By: CHRISTELLA.  Shick M.D.   On: 08/21/2024 16:21   DG CHEST PORT 1 VIEW Result Date: 08/20/2024 CLINICAL DATA:  Follow-up right pleural effusion. EXAM: PORTABLE CHEST 1 VIEW COMPARISON:  08/19/2024 and chest CTA dated 08/16/2024. FINDINGS: The heart size can not be assessed due to obscuration of the right heart border. No significant change in confluence and patchy increased density in the right mid and lower lung zones. No significant change in chronic bilateral interstitial lung disease and right lung bullous changes. Stable laterally tracking pleural fluid on the right. Tortuous and partially calcified thoracic aorta. Unremarkable bones. IMPRESSION: 1. No significant change in right mid and lower lung zone pneumonia/atelectasis and right pleural effusion. 2. Stable chronic bilateral interstitial lung disease and right lung bullous changes. Electronically Signed   By: Elspeth Bathe M.D.   On: 08/20/2024 10:49   DG CHEST PORT 1 VIEW Result Date: 08/19/2024 CLINICAL DATA:  Shortness of breath. EXAM: PORTABLE CHEST 1 VIEW COMPARISON:  08/18/2024. FINDINGS: The cardio pericardial silhouette is enlarged. Right base collapse/consolidation with moderate right  effusion is similar. Background diffuse interstitial opacity is similar. No left pleural effusion. Bones are diffusely demineralized. Telemetry leads overlie the chest. IMPRESSION: No substantial interval change. Right base collapse/consolidation with moderate right effusion. Electronically Signed   By: Camellia Candle M.D.   On: 08/19/2024 09:17   DG CHEST PORT 1 VIEW Result Date: 08/18/2024 CLINICAL DATA:  Pleural effusion.  Lung carcinoma.  Thoracentesis. EXAM: PORTABLE  CHEST 1 VIEW COMPARISON:  05/18/2024 FINDINGS: Moderate right pleural effusion shows increase in size since prior exam. Increased right lower lung atelectasis. No pneumothorax visualized. Chronic interstitial lung disease again noted. Stable heart size. IMPRESSION: Increased size of moderate right pleural effusion and right lower lung atelectasis. No pneumothorax visualized. Chronic interstitial lung disease. Electronically Signed   By: Norleen DELENA Kil M.D.   On: 08/18/2024 11:57   CT Angio Chest PE W and/or Wo Contrast Result Date: 08/16/2024 CLINICAL DATA:  Shortness of breath, hemoptysis. History of lung cancer. EXAM: CT ANGIOGRAPHY CHEST WITH CONTRAST TECHNIQUE: Multidetector CT imaging of the chest was performed using the standard protocol during bolus administration of intravenous contrast. Multiplanar CT image reconstructions and MIPs were obtained to evaluate the vascular anatomy. RADIATION DOSE REDUCTION: This exam was performed according to the departmental dose-optimization program which includes automated exposure control, adjustment of the mA and/or kV according to patient size and/or use of iterative reconstruction technique. CONTRAST:  75mL OMNIPAQUE  IOHEXOL  350 MG/ML SOLN COMPARISON:  July 16, 2024.  June 28, 2024. FINDINGS: Cardiovascular: Satisfactory opacification of the pulmonary arteries to the segmental level. No evidence of pulmonary embolism. Mild cardiomegaly. No pericardial effusion. Mediastinum/Nodes: Thyroid  gland is unremarkable. The esophagus is unremarkable. Extensive adenopathy is noted in the subcarinal and right hilar regions as noted on recent PET scan. This mass appears to extend into and occlude the right lower lobe bronchus proximally. Lungs/Pleura: No pneumothorax is noted. Emphysematous disease is noted. 3.7 cm mass noted posteriorly in left lower lobe consistent with metastatic disease. Moderate size right pleural effusion is noted with adjacent atelectasis or infiltrate of  residual right upper lobe. Upper Abdomen: No acute abnormality. Musculoskeletal: No chest wall abnormality. No acute or significant osseous findings. Review of the MIP images confirms the above findings. IMPRESSION: 1. No definite evidence of pulmonary embolus. 2. Extensive subcarinal and right hilar adenopathy is noted as noted on recent PET scan. This mass appears to extend into and occlude the right lower lobe bronchus proximally. 3. Moderate size right pleural effusion is noted with adjacent atelectasis or infiltrate of residual right upper lobe. 4. 3.7 cm mass noted posteriorly in left lower lobe consistent with metastatic disease. 5. Emphysema. Aortic Atherosclerosis (ICD10-I70.0) and Emphysema (ICD10-J43.9). Electronically Signed   By: Lynwood Landy Raddle M.D.   On: 08/16/2024 17:30      Subjective: Patient seen and examined at bedside today.  Hemodynamically stable.  On baseline oxygen requirement.  Denying new complaints.  Denied any worsening shortness of breath or cough.  Remains weak and deconditioned.  Medically stable for discharge to SNF today.  Patient asked not to call any family members  Discharge Exam: Vitals:   09/14/24 0950 09/14/24 1314  BP:  (!) 148/95  Pulse:  92  Resp:  18  Temp:  98.1 F (36.7 C)  SpO2: 100% 99%   Vitals:   09/13/24 2144 09/14/24 0607 09/14/24 0950 09/14/24 1314  BP: (!) 157/89 133/84  (!) 148/95  Pulse: (!) 106 (!) 101  92  Resp: 18 16  18   Temp: 99.4  F (37.4 C) 98.5 F (36.9 C)  98.1 F (36.7 C)  TempSrc: Oral Oral  Oral  SpO2: 96% 98% 100% 99%  Weight:      Height:        General: Pt is alert, awake, not in acute distress, deconditioned, weak Cardiovascular: RRR, S1/S2 +, no rubs, no gallops Respiratory: Diminished sounds bilaterally, bilateral basal crackles Abdominal: Soft, NT, ND, bowel sounds + Extremities: no edema, no cyanosis    The results of significant diagnostics from this hospitalization (including imaging, microbiology,  ancillary and laboratory) are listed below for reference.     Microbiology: Recent Results (from the past 240 hours)  Urine Culture (for pregnant, neutropenic or urologic patients or patients with an indwelling urinary catheter)     Status: Abnormal   Collection Time: 09/10/24  9:07 AM   Specimen: Urine, Clean Catch  Result Value Ref Range Status   Specimen Description   Final    URINE, CLEAN CATCH Performed at [facility], [address], [place], [place] [address]    Special Requests   Final    NONE Performed at [facility], [address], [place], [place] [address]    Culture MULTIPLE SPECIES PRESENT, SUGGEST RECOLLECTION (A)  Final   Report Status 09/12/2024 FINAL  Final  Resp panel by RT-PCR (RSV, Flu A&B, Covid) Anterior Nasal Swab     Status: None   Collection Time: 09/10/24  3:36 PM   Specimen: Anterior Nasal Swab  Result Value Ref Range Status   SARS Coronavirus 2 by RT PCR NEGATIVE NEGATIVE Final    Comment: (NOTE) SARS-CoV-2 target nucleic acids are NOT DETECTED.  The SARS-CoV-2 RNA is generally detectable in upper respiratory specimens during the acute phase of infection. The lowest concentration of SARS-CoV-2 viral copies this assay can detect is 138 copies/mL. A negative result does not preclude SARS-Cov-2 infection and should not be used as the sole basis for treatment or other patient management decisions. A negative result may occur with  improper specimen collection/handling, submission of specimen other than nasopharyngeal swab, presence of viral mutation(s) within the areas targeted by this assay, and inadequate number of viral copies(<138 copies/mL). A negative result must be combined with clinical observations, patient history, and epidemiological information. The expected result is Negative.  Fact Sheet for Patients:  BloggerCourse.com  Fact Sheet for Healthcare Providers:   SeriousBroker.it  This test is no t yet approved or cleared by the [place]  FDA and  has been authorized for detection and/or diagnosis of SARS-CoV-2 by FDA under an Emergency Use Authorization (EUA). This EUA will remain  in effect (meaning this test can be used) for the duration of the COVID-19 declaration under Section 564(b)(1) of the Act, 21 U.S.C.section 360bbb-3(b)(1), unless the authorization is terminated  or revoked sooner.       Influenza A by PCR NEGATIVE NEGATIVE Final   Influenza B by PCR NEGATIVE NEGATIVE Final    Comment: (NOTE) The Xpert Xpress SARS-CoV-2/FLU/RSV plus assay is intended as an aid in the diagnosis of influenza from Nasopharyngeal swab specimens and should not be used as a sole basis for treatment. Nasal washings and aspirates are unacceptable for Xpert Xpress SARS-CoV-2/FLU/RSV testing.  Fact Sheet for Patients: BloggerCourse.com  Fact Sheet for Healthcare Providers: SeriousBroker.it  This test is not yet approved or cleared by the [place]  FDA and has been authorized for detection and/or diagnosis of SARS-CoV-2 by FDA under an Emergency Use Authorization (EUA). This EUA will  remain in effect (meaning this test can be used) for the duration of the COVID-19 declaration under Section 564(b)(1) of the Act, 21 U.S.C. section 360bbb-3(b)(1), unless the authorization is terminated or revoked.     Resp Syncytial Virus by PCR NEGATIVE NEGATIVE Final    Comment: (NOTE) Fact Sheet for Patients: BloggerCourse.com  Fact Sheet for Healthcare Providers: SeriousBroker.it  This test is not yet approved or cleared by the [place]  FDA and has been authorized for detection and/or diagnosis of SARS-CoV-2 by FDA under an Emergency Use Authorization (EUA). This EUA will remain in effect (meaning this test can be used) for  the duration of the COVID-19 declaration under Section 564(b)(1) of the Act, 21 U.S.C. section 360bbb-3(b)(1), unless the authorization is terminated or revoked.  Performed at [facility], [address], [place], [place] [address]   Pleural fluid culture w Gram Stain     Status: None   Collection Time: 09/11/24 11:03 AM   Specimen: Pleural Fluid  Result Value Ref Range Status   Specimen Description   Final    PLEURAL Performed at [facility], [address] [place], [place] [address]    Special Requests   Final    Immunocompromised Performed at [facility], [address], [place], [place] [address]    Gram Stain NO WBC SEEN NO ORGANISMS SEEN   Final   Culture   Final    NO GROWTH 3 DAYS Performed at [facility], [address], [place] [place], [place] [address]    Report Status 09/14/2024 FINAL  Final     Labs: BNP (last 3 results) Recent Labs    04/06/24 1807 04/10/24 0411  BNP 141.6* 90.9   Basic Metabolic Panel: Recent Labs  Lab 09/10/24 1550 09/11/24 0532 09/12/24 0549 09/13/24 0541  NA 132* 132* 134* 133*  K 4.1 4.0 3.9 3.6  CL 94* 98 102 101  CO2 29 22 20* 21*  GLUCOSE 193* 243* 265* 203*  BUN 20 14 9  5*  CREATININE 0.53 0.38* 0.37* 0.34*  CALCIUM  12.2* 10.4* 8.9 9.2  MG 1.4* 1.9 1.4*  --    Liver Function Tests: Recent Labs  Lab 09/10/24 1550 09/11/24 0532 09/11/24 2125  AST 11* 10*  --   ALT <5 <5  --   ALKPHOS 68 56  --   BILITOT 0.3 0.2  --   PROT 7.1 5.8* 5.6*  ALBUMIN 3.5 2.8*  --    No results for input(s): LIPASE, AMYLASE in the last 168 hours. No results for input(s): AMMONIA in the last 168 hours. CBC: Recent Labs  Lab 09/10/24 1550 09/11/24 0532 09/11/24 1718 09/12/24 0549 09/13/24 0541  WBC 7.2 5.0  --  5.7 4.9  NEUTROABS 5.8  --   --  4.4 3.4  HGB 9.4* 7.7* 9.7* 8.3* 8.4*  HCT 33.2* 27.6* 34.2* 28.0* 28.8*  MCV 84.1 86.3  --  86.2 85.5  PLT 497*  331  --  298 289   Cardiac Enzymes: No results for input(s): CKTOTAL, CKMB, CKMBINDEX, TROPONINI in the last 168 hours. BNP: Invalid input(s): POCBNP CBG: Recent Labs  Lab 09/13/24 1137 09/13/24 1717 09/13/24 2211 09/14/24 0735 09/14/24 1128  GLUCAP 207* 264* 261* 224* 282*   D-Dimer No results for input(s): DDIMER in the last 72 hours. Hgb A1c No results for input(s): HGBA1C in the last 72 hours. Lipid Profile No results for input(s): CHOL, HDL, LDLCALC, TRIG, CHOLHDL, LDLDIRECT in the last 72  hours. Thyroid  function studies No results for input(s): TSH, T4TOTAL, T3FREE, THYROIDAB in the last 72 hours.  Invalid input(s): FREET3 Anemia work up No results for input(s): VITAMINB12, FOLATE, FERRITIN, TIBC, IRON, RETICCTPCT in the last 72 hours. Urinalysis    Component Value Date/Time   COLORURINE YELLOW 09/10/2024 2348   APPEARANCEUR CLEAR 09/10/2024 2348   LABSPEC 1.027 09/10/2024 2348   PHURINE 5.0 09/10/2024 2348   GLUCOSEU >=500 (A) 09/10/2024 2348   HGBUR NEGATIVE 09/10/2024 2348   BILIRUBINUR NEGATIVE 09/10/2024 2348   KETONESUR 5 (A) 09/10/2024 2348   PROTEINUR 30 (A) 09/10/2024 2348   NITRITE NEGATIVE 09/10/2024 2348   LEUKOCYTESUR LARGE (A) 09/10/2024 2348   Sepsis Labs Recent Labs  Lab 09/10/24 1550 09/11/24 0532 09/12/24 0549 09/13/24 0541  WBC 7.2 5.0 5.7 4.9   Microbiology Recent Results (from the past 240 hours)  Urine Culture (for pregnant, neutropenic or urologic patients or patients with an indwelling urinary catheter)     Status: Abnormal   Collection Time: 09/10/24  9:07 AM   Specimen: Urine, Clean Catch  Result Value Ref Range Status   Specimen Description   Final    URINE, CLEAN CATCH Performed at [facility], [address], [place], [place] [address]    Special Requests   Final    NONE Performed at [facility], [address], [place], [place]  [address]    Culture MULTIPLE SPECIES PRESENT, SUGGEST RECOLLECTION (A)  Final   Report Status 09/12/2024 FINAL  Final  Resp panel by RT-PCR (RSV, Flu A&B, Covid) Anterior Nasal Swab     Status: None   Collection Time: 09/10/24  3:36 PM   Specimen: Anterior Nasal Swab  Result Value Ref Range Status   SARS Coronavirus 2 by RT PCR NEGATIVE NEGATIVE Final    Comment: (NOTE) SARS-CoV-2 target nucleic acids are NOT DETECTED.  The SARS-CoV-2 RNA is generally detectable in upper respiratory specimens during the acute phase of infection. The lowest concentration of SARS-CoV-2 viral copies this assay can detect is 138 copies/mL. A negative result does not preclude SARS-Cov-2 infection and should not be used as the sole basis for treatment or other patient management decisions. A negative result may occur with  improper specimen collection/handling, submission of specimen other than nasopharyngeal swab, presence of viral mutation(s) within the areas targeted by this assay, and inadequate number of viral copies(<138 copies/mL). A negative result must be combined with clinical observations, patient history, and epidemiological information. The expected result is Negative.  Fact Sheet for Patients:  BloggerCourse.com  Fact Sheet for Healthcare Providers:  SeriousBroker.it  This test is no t yet approved or cleared by the [place]  FDA and  has been authorized for detection and/or diagnosis of SARS-CoV-2 by FDA under an Emergency Use Authorization (EUA). This EUA will remain  in effect (meaning this test can be used) for the duration of the COVID-19 declaration under Section 564(b)(1) of the Act, 21 U.S.C.section 360bbb-3(b)(1), unless the authorization is terminated  or revoked sooner.       Influenza A by PCR NEGATIVE NEGATIVE Final   Influenza B by PCR NEGATIVE NEGATIVE Final    Comment: (NOTE) The Xpert Xpress SARS-CoV-2/FLU/RSV plus  assay is intended as an aid in the diagnosis of influenza from Nasopharyngeal swab specimens and should not be used as a sole basis for treatment. Nasal washings and aspirates are unacceptable for Xpert Xpress SARS-CoV-2/FLU/RSV testing.  Fact Sheet for Patients: BloggerCourse.com  Fact Sheet for Healthcare  Providers: SeriousBroker.it  This test is not yet approved or cleared by the [place]  FDA and has been authorized for detection and/or diagnosis of SARS-CoV-2 by FDA under an Emergency Use Authorization (EUA). This EUA will remain in effect (meaning this test can be used) for the duration of the COVID-19 declaration under Section 564(b)(1) of the Act, 21 U.S.C. section 360bbb-3(b)(1), unless the authorization is terminated or revoked.     Resp Syncytial Virus by PCR NEGATIVE NEGATIVE Final    Comment: (NOTE) Fact Sheet for Patients: BloggerCourse.com  Fact Sheet for Healthcare Providers: SeriousBroker.it  This test is not yet approved or cleared by the [place]  FDA and has been authorized for detection and/or diagnosis of SARS-CoV-2 by FDA under an Emergency Use Authorization (EUA). This EUA will remain in effect (meaning this test can be used) for the duration of the COVID-19 declaration under Section 564(b)(1) of the Act, 21 U.S.C. section 360bbb-3(b)(1), unless the authorization is terminated or revoked.  Performed at [facility], [address], [place], [place] [address]   Pleural fluid culture w Gram Stain     Status: None   Collection Time: 09/11/24 11:03 AM   Specimen: Pleural Fluid  Result Value Ref Range Status   Specimen Description   Final    PLEURAL Performed at [facility], [address], [place], [place] [address]    Special Requests   Final    Immunocompromised Performed at [facility], [address], [place], [place] [address]    Gram Stain NO WBC SEEN NO ORGANISMS SEEN   Final   Culture   Final    NO GROWTH 3 DAYS Performed at [facility], [address] [place], [place] [address]    Report Status 09/14/2024 FINAL  Final    Please note: You were cared for by a hospitalist during your hospital stay. Once you are discharged, your primary care physician will handle any further medical issues. Please note that NO REFILLS for any discharge medications will be authorized once you are discharged, as it is imperative that you return to your primary care physician (or establish a relationship with a primary care physician if you do not have one) for your post hospital discharge needs so that they can reassess your need for medications and monitor your lab values.    Time coordinating discharge: 40 minutes  SIGNED:   Ivonne Mustache, MD  [facility] 09/14/2024, 3:05 PM Pager [phone]  If 7PM-7AM, please contact night-coverage [email or web address] Password [email or web address]

## 2024-09-14 NOTE — Progress Notes (Signed)
 Patient Alert and oriented X4 , transported by [facility] to [facility].  This nurse called 4 times to give report to the receiving facility and no one answered.  Discharge Packet handed to the [facility] Team.No more concerns at this time.

## 2024-09-15 LAB — BODY FLUID CELL COUNT WITH DIFFERENTIAL
Eos, Fluid: 1 %
Lymphs, Fluid: 76 %
Monocyte-Macrophage-Serous Fluid: 10 % — ABNORMAL LOW (ref 50–90)
Neutrophil Count, Fluid: 13 % (ref 0–25)
Total Nucleated Cell Count, Fluid: 438 uL (ref 0–1000)

## 2024-09-17 ENCOUNTER — Telehealth: Payer: Self-pay | Admitting: Medical Oncology

## 2024-09-17 ENCOUNTER — Other Ambulatory Visit

## 2024-09-17 NOTE — Telephone Encounter (Signed)
 Pt resides at [facility].  I LVM that she does not need an appt this week for labs  and she needs to keep her appt for 09/29.  I LVM at [facility] transport team to schedule transportation to next appts .

## 2024-09-19 ENCOUNTER — Inpatient Hospital Stay

## 2024-09-20 ENCOUNTER — Ambulatory Visit

## 2024-09-20 ENCOUNTER — Ambulatory Visit: Admitting: Internal Medicine

## 2024-09-20 ENCOUNTER — Other Ambulatory Visit

## 2024-09-22 ENCOUNTER — Ambulatory Visit

## 2024-09-23 NOTE — Progress Notes (Unsigned)
 [facility] OFFICE PROGRESS NOTE  [name], MD [address] [place] [address]  DIAGNOSIS:  1) history of stage Ia non-small cell lung cancer, adenocarcinoma the right upper lobe who under went radiation under the care of Dr. Patrcia from 04/23/2022-04/29/22 2) Recurrent lung cancer, initially diagnosed as Stage IIIa confirm (T3, N2, M0) non-small cell lung cancer, squamous cell carcinoma. She presented with recurrent tumor at the right lung base, associated right lower lobe metastasis, and right hilar and subcarinal nodal metastases. This was diagnosed in October 2024.   PDL1: 85%   PRIOR THERAPY: 1) Radiation to the right upper lobe under the care of Dr. Patrcia from 04/23/2022-04/29/22  2) Concurrent chemoradiation with carboplatin  for an AUC of 2 and paclitaxel  45 mg/m.  Last dose on 12/20/23 Status post 7 cycle.  3) Consolidation immunotherapy with Imfinzi  1500 mg IV every 4 weeks, first dose expected on 02/07/2024. Discontinued after 1 cycle due to concern for pneumonitis  CURRENT THERAPY: Palliative systemic chemotherapy with carboplatin  for an AUC of 5, paclitaxel  175 mg/m, Keytruda  200 mg IV every 3 weeks first dose on 07/23/2024.  She will also receive Neulasta  support.  Her dose of chemotherapy will be reduced to AUC of 4 and 150 mg/m starting from cycle #2.   INTERVAL HISTORY: DALEYSSA LOISELLE 73 y.o. female returns to the clinic today for a follow up visit.  In summary, she was found to have progressive disease in July 2025. She was started back on chemotherapy and immunotherapy. She has only received two cycle due to frequent hospitalizations.   She was hospitalized for 08/16/24-08/24/24 for worsening hemoptysis and dyspnea. She underwent thoracentesis which was negative for malignant cells. Pulmonary discussed potential pleurX catheter but the patient wanted to consider her options. She underwent intermittent lasix . She underwent nebulizer.  If she has recurrent hemoptysis, then she can be considered for IR  embolization. She was treated with antibiotics with rocephin . Steroids caused hyperglycemia previously so she did not receive steroids. She also had tachycardia and zio patch was being planned at discharge.   We then saw her back on 08/28/24. At that time she still was feeling weak, fatigue, and shortness of breath. She also had oral thrush and decreased appetite. She also was having diarrhea. We also arranged thoracentesis for recurrent right effusion.   We deferred her treatment by 1 week to allow more time to recover. Dr. Sherrod was also planning on reducing her dose of chemotherapy. If she continues to have trouble, then he would consider her for single agent immunotherapy with Keytruda .   She underwent cycle #2 with reduced dose chemotherapy.   She missed her neulasta  injection for cycle #2.   She then presented to the [facility] on 09[facility]10[facility]24 for the chief complaint of weakness. She also had recurrent effusion. Pleurx was offered by pulmonary but she declined. She was discharged to a SNF. She is currently at [facility]. She is expected to be there until later this week. She is getting PT and OT which is helpful.   She also received a unit of blood during her admission. She reports improvement in breathing but continues to experience fatigue and exertional dyspnea. She is on three liters of oxygen, with improved oxygen saturation in the high 90s and a stable heart rate.  She experiences intermittent right-sided pain described as a burning sensation near her right lung, not associated with respiration, sometimes accompanied by belching. Tramadol every six hours.  Her appetite has improved, and she feels hungry, though fatigue persists. She has a history of oral thrush, treated during hospitalization, and experiences taste changes, likely due to chemotherapy. She is considering salt water rinses.   No current cough, nausea, vomiting,  or significant diarrhea, but she has constipation related to iron supplements. She uses stool softeners and increases intake of water, fruits, and vegetables.  She has a rash under the skin folds on her abdomen, which she noticed.   She denies any fever, chills, or night sweats.  She denies any significant cough at this time.  She denies any hemoptysis at this time.  She denies any nausea, vomiting, or diarrhea.  She denies any headache or visual changes.  She is here for evaluation and a more detailed discussion about her current condition and treatment options.    MEDICAL HISTORY: Past Medical History:  Diagnosis Date   Angioedema 10/17/2023   Arthritis    Diabetes mellitus without complication (HCC)    Hypertension     ALLERGIES:  is allergic to benzonatate, lisinopril, aspirin , azithromycin , and codeine .  MEDICATIONS:  Current Outpatient Medications  Medication Sig Dispense Refill   nystatin  (MYCOSTATIN /NYSTOP ) powder Apply 1 Application topically 3 (three) times daily. 30 g 0   albuterol  (VENTOLIN  HFA) 108 (90 Base) MCG/ACT inhaler Inhale 2 puffs into the lungs every 6 (six) hours as needed for wheezing or shortness of breath. 20.1 g 2   aspirin  EC 81 MG tablet Take 81 mg by mouth daily. Swallow whole.     bisoprolol  (ZEBETA ) 10 MG tablet Take 1 tablet (10 mg total) by mouth daily. 90 tablet 3   cetirizine  (ZYRTEC  ALLERGY ) 10 MG tablet Take 1 tablet (10 mg total) by mouth daily. 90 tablet 1   diltiazem  (CARDIZEM  CD) 360 MG 24 hr capsule Take 1 capsule (360 mg total) by mouth daily. 90 capsule 3   empagliflozin  (JARDIANCE ) 25 MG TABS tablet Take 25 mg by mouth daily at 6 PM.     ferrous sulfate  325 (65 FE) MG tablet Take 325 mg by mouth daily with breakfast.     glipiZIDE  (GLUCOTROL  XL) 5 MG 24 hr tablet Take 1 tablet (5 mg total) by mouth daily with breakfast. 30 tablet 0   insulin  aspart (NOVOLOG ) 100 UNIT/ML FlexPen Inject 0-11 Units into the skin 3 (three) times daily with  meals. Check Blood Glucose (BG) and inject per scale: BG <150= 0 unit; BG 150-200= 1 unit; BG 201-250= 3 unit; BG 251-300= 5 unit; BG 301-350= 7 unit; BG 351-400= 9 unit; BG >400= 11 unit and Call Primary Care. (Patient taking differently: Inject 0-11 Units into the skin as needed for high blood sugar. Check Blood Glucose (BG) and inject per scale: BG <150= 0 unit; BG 150-200= 1 unit; BG 201-250= 3 unit; BG 251-300= 5 unit; BG 301-350= 7 unit; BG 351-400= 9 unit; BG >400= 11 unit and Call Primary Care.) 15 mL 0   ipratropium-albuterol  (DUONEB) 0.5-2.5 (3) MG/3ML SOLN Take 3 mLs by nebulization in the morning and at bedtime. 360 mL 2   lidocaine -prilocaine  (EMLA ) cream Apply to affected area once (Patient not taking: Reported on 09/12/2024) 30 g 3   metFORMIN  (GLUCOPHAGE ) 1000 MG tablet Take 1 tablet (1,000 mg total) by mouth 2 (two) times daily with a meal.     nystatin  (MYCOSTATIN ) 100000 UNIT/ML suspension Take 5 mLs (500,000 Units total) by mouth 4 (four) times daily. 60 mL 0   ondansetron  (ZOFRAN ) 8 MG tablet Take 1  tablet (8 mg total) by mouth every 8 (eight) hours as needed for nausea or vomiting. Start on the third day after carboplatin . 30 tablet 1   polyethylene glycol (MIRALAX  / GLYCOLAX ) 17 g packet Take 17 g packet by mouth daily. 14 each 0   prochlorperazine  (COMPAZINE ) 10 MG tablet Take 1 tablet (10 mg total) by mouth every 6 (six) hours as needed. (Patient taking differently: Take 10 mg by mouth every 6 (six) hours as needed for nausea or vomiting.) 30 tablet 2   senna-docusate (SENOKOT-S) 8.6-50 MG tablet Take 1 tablet by mouth 2 (two) times daily.     umeclidinium-vilanterol (ANORO ELLIPTA ) 62.5-25 MCG/ACT AEPB Inhale 1 puff into the lungs daily. 14 each 6   No current facility-administered medications for this visit.   Facility-Administered Medications Ordered in Other Visits  Medication Dose Route Frequency Provider Last Rate Last Admin   0.9 %  sodium chloride  infusion   Intravenous  Continuous Sherrod Sherrod, MD       pembrolizumab  (KEYTRUDA ) 200 mg in sodium chloride  0.9 % 50 mL chemo infusion  200 mg Intravenous Once Sherrod Sherrod, MD        SURGICAL HISTORY:  Past Surgical History:  Procedure Laterality Date   BRONCHIAL BIOPSY  03/30/2022   Procedure: BRONCHIAL BIOPSIES;  Surgeon: Brenna Adine CROME, DO;  Location: [facility] ENDOSCOPY;  Service: Pulmonary;;   BRONCHIAL BIOPSY  10/17/2023   Procedure: BRONCHIAL BIOPSIES;  Surgeon: Shelah Lamar RAMAN, MD;  Location: [facility] ENDOSCOPY;  Service: Pulmonary;;   BRONCHIAL BRUSHINGS  03/30/2022   Procedure: BRONCHIAL BRUSHINGS;  Surgeon: Brenna Adine CROME, DO;  Location: [facility] ENDOSCOPY;  Service: Pulmonary;;   BRONCHIAL BRUSHINGS  10/17/2023   Procedure: BRONCHIAL BRUSHINGS;  Surgeon: Shelah Lamar RAMAN, MD;  Location: [facility] ENDOSCOPY;  Service: Pulmonary;;   BRONCHIAL NEEDLE ASPIRATION BIOPSY  03/30/2022   Procedure: BRONCHIAL NEEDLE ASPIRATION BIOPSIES;  Surgeon: Brenna Adine CROME, DO;  Location: [facility] ENDOSCOPY;  Service: Pulmonary;;   BRONCHIAL NEEDLE ASPIRATION BIOPSY  10/17/2023   Procedure: BRONCHIAL NEEDLE ASPIRATION BIOPSIES;  Surgeon: Shelah Lamar RAMAN, MD;  Location: [facility] ENDOSCOPY;  Service: Pulmonary;;   BRONCHIAL WASHINGS  05/19/2024   Procedure: IRRIGATION, BRONCHUS;  Surgeon: Jude Harden GAILS, MD;  Location: [facility] ENDOSCOPY;  Service: Cardiopulmonary;;   EYE SURGERY     FIDUCIAL MARKER PLACEMENT  03/30/2022   Procedure: FIDUCIAL MARKER PLACEMENT;  Surgeon: Brenna Adine CROME, DO;  Location: [facility] ENDOSCOPY;  Service: Pulmonary;;   FINE NEEDLE ASPIRATION  10/17/2023   Procedure: FINE NEEDLE ASPIRATION;  Surgeon: Shelah Lamar RAMAN, MD;  Location: [facility] ENDOSCOPY;  Service: Pulmonary;;   FOREIGN BODY REMOVAL  03/30/2022   Procedure: FOREIGN BODY REMOVAL;  Surgeon: Brenna Adine CROME, DO;  Location: [facility] ENDOSCOPY;  Service: Pulmonary;;   HEMOSTASIS CONTROL  10/17/2023   Procedure: HEMOSTASIS CONTROL;  Surgeon: Shelah Lamar RAMAN, MD;  Location: [facility] ENDOSCOPY;  Service:  Pulmonary;;   IR THORACENTESIS ASP PLEURAL SPACE W/IMG GUIDE  08/21/2024   IR THORACENTESIS ASP PLEURAL SPACE W/IMG GUIDE  08/30/2024   VIDEO BRONCHOSCOPY Bilateral 05/19/2024   Procedure: VIDEO BRONCHOSCOPY WITHOUT FLUORO;  Surgeon: Jude Harden GAILS, MD;  Location: [facility] ENDOSCOPY;  Service: Cardiopulmonary;  Laterality: Bilateral;   VIDEO BRONCHOSCOPY WITH ENDOBRONCHIAL ULTRASOUND Bilateral 03/30/2022   Procedure: VIDEO BRONCHOSCOPY WITH ENDOBRONCHIAL ULTRASOUND;  Surgeon: Brenna Adine CROME, DO;  Location: [facility] ENDOSCOPY;  Service: Pulmonary;  Laterality: Bilateral;   VIDEO BRONCHOSCOPY WITH ENDOBRONCHIAL ULTRASOUND Right 10/17/2023   Procedure: VIDEO BRONCHOSCOPY WITH ENDOBRONCHIAL ULTRASOUND;  Surgeon: Shelah,  Lamar RAMAN, MD;  Location: [facility] ENDOSCOPY;  Service: Pulmonary;  Laterality: Right;   VIDEO BRONCHOSCOPY WITH RADIAL ENDOBRONCHIAL ULTRASOUND  03/30/2022   Procedure: VIDEO BRONCHOSCOPY WITH RADIAL ENDOBRONCHIAL ULTRASOUND;  Surgeon: Brenna Adine CROME, DO;  Location: [facility] ENDOSCOPY;  Service: Pulmonary;;    REVIEW OF SYSTEMS:   Review of Systems  Constitutional: Positive for fatigue. Improved appetite but intermittent low appetite. Negative for chills, fever and unexpected weight change.  HENT: Positive for taste alterations. Negative for mouth sores, nosebleeds, sore throat and trouble swallowing.   Eyes: Negative for eye problems and icterus.  Respiratory: Positive for dyspnea on exertion. Negative for cough, hemoptysis, and wheezing.   Cardiovascular: Positive for lateral right rib pain. Negative for chest pain and leg swelling.  Gastrointestinal: Positive for occasional constipation. Negative for abdominal pain, diarrhea, nausea and vomiting.  Genitourinary: Negative for bladder incontinence, difficulty urinating, dysuria, frequency and hematuria.   Musculoskeletal: Negative for back pain, gait problem, neck pain and neck stiffness.  Skin: Negative for itching and rash.  Neurological: Negative for  dizziness, extremity weakness, gait problem, headaches, light-headedness and seizures.  Hematological: Negative for adenopathy. Does not bruise/bleed easily.  Psychiatric/Behavioral: Negative for confusion, depression and sleep disturbance. The patient is not nervous/anxious.     PHYSICAL EXAMINATION:  Blood pressure 128/66, pulse (!) 101, temperature 97.6 F (36.4 C), temperature source Temporal, resp. rate 16, height 5' 4 (1.626 m), weight 138 lb (62.6 kg), SpO2 100%.  ECOG PERFORMANCE STATUS: 1  Physical Exam  Constitutional: Oriented to person, place, and time and well-developed, well-nourished, and in no distress.  HENT:  Head: Normocephalic and atraumatic.  Mouth/Throat: Oropharynx is clear and moist. No oropharyngeal exudate.  Eyes: Conjunctivae are normal. Right eye exhibits no discharge. Left eye exhibits no discharge. No scleral icterus.  Neck: Normal range of motion. Neck supple.  Cardiovascular: Normal rate, regular rhythm, normal heart sounds and intact distal pulses.   Pulmonary/Chest: Effort normal. Decreased breath sounds at base of right lung. On supplemental oxygen. No respiratory distress. No wheezes. No rales.  Abdominal: Soft. Bowel sounds are normal. Exhibits no distension and no mass. There is no tenderness.  Musculoskeletal: Normal range of motion. Exhibits no edema.  Lymphadenopathy:    No cervical adenopathy.  Neurological: Alert and oriented to person, place, and time. Exhibits muscle wasting. Examined in the wheelchair.  Skin: Skin is warm and dry. No rash noted. Not diaphoretic. No erythema. No pallor.  Psychiatric: Mood, memory and judgment normal.  Vitals reviewed.  LABORATORY DATA: Lab Results  Component Value Date   WBC 9.6 09/24/2024   HGB 9.5 (L) 09/24/2024   HCT 31.3 (L) 09/24/2024   MCV 82.8 09/24/2024   PLT 438 (H) 09/24/2024      Chemistry      Component Value Date/Time   NA 138 09/24/2024 0944   K 3.6 09/24/2024 0944   CL 103  09/24/2024 0944   CO2 25 09/24/2024 0944   BUN 12 09/24/2024 0944   CREATININE 0.57 09/24/2024 0944      Component Value Date/Time   CALCIUM  9.9 09/24/2024 0944   ALKPHOS 77 09/24/2024 0944   AST 10 (L) 09/24/2024 0944   ALT <5 09/24/2024 0944   BILITOT 0.2 09/24/2024 0944       RADIOGRAPHIC STUDIES:  DG Chest Port 1 View Result Date: 09/11/2024 CLINICAL DATA:  Status post right thoracentesis EXAM: PORTABLE CHEST 1 VIEW COMPARISON:  Chest radiograph dated 09/10/2024 FINDINGS: Low lung volumes. Similar diffuse interstitial and dense right  basilar opacity. Decreased moderate right pleural effusion. No definite pneumothorax. Right heart border is obscured. No acute osseous abnormality. IMPRESSION: 1. Decreased moderate right pleural effusion. No definite pneumothorax. 2. Similar diffuse interstitial and dense right basilar opacity. Electronically Signed   By: Limin  Xu M.D.   On: 09/11/2024 11:14   DG Chest Port 1 View Result Date: 09/10/2024 EXAM: 1 VIEW XRAY OF THE CHEST 09/10/2024 04:25:34 PM COMPARISON: 08/30/2024 CLINICAL HISTORY: Patient brought in by [facility] for weakness that she's been having since her chemo treatment on 09/04/24. FINDINGS: LUNGS AND PLEURA: Persistent right basilar airspace opacity. Persistent diffuse interstitial prominence. Moderate right pleural effusion with right basilar atelectasis. HEART AND MEDIASTINUM: Atherosclerotic plaque. BONES AND SOFT TISSUES: No acute osseous abnormality. IMPRESSION: 1. Moderate right pleural effusion with right basilar opacities, increased from prior. 2. Similar appearance of interstitial edema. Electronically signed by: Donnice Mania MD 09/10/2024 04:46 PM EDT RP Workstation: [id number]   IR THORACENTESIS ASP PLEURAL SPACE W/IMG GUIDE Result Date: 08/30/2024 INDICATION: Patient with history of lung cancer, dyspnea, recurrent right pleural effusion; request received for diagnostic and therapeutic right thoracentesis EXAM: ULTRASOUND GUIDED  DIAGNOSTIC AND THERAPEUTIC RIGHT  THORACENTESIS MEDICATIONS: 8 ml 1% lidocaine  with epinephrine  to skin/subcutaneous tissue COMPLICATIONS: None immediate. PROCEDURE: An ultrasound guided thoracentesis was thoroughly discussed with the patient and questions answered. The benefits, risks, alternatives and complications were also discussed. The patient understands and wishes to proceed with the procedure. Written consent was obtained. Ultrasound was performed to localize and mark an adequate pocket of fluid in the right chest. The area was then prepped and draped in the normal sterile fashion. 1% Lidocaine  was used for local anesthesia. Under ultrasound guidance a 6 Fr Safe-T-Centesis catheter was introduced. Thoracentesis was performed. The catheter was removed and a dressing applied. FINDINGS: A total of approximately 725 cc of slightly hazy, yellow fluid was removed. Samples were sent to the laboratory as requested by the clinical team. IMPRESSION: Successful ultrasound guided diagnostic and therapeutic right thoracentesis yielding 725 cc of pleural fluid. Performed by: Franky Rakers, PA-C Electronically Signed   By: Juliene Balder M.D.   On: 08/30/2024 15:36   DG Chest 1 View Result Date: 08/30/2024 CLINICAL DATA:  Post right thoracentesis EXAM: CHEST  1 VIEW COMPARISON:  08/28/2024 FINDINGS: Moderate right pleural effusion again noted, slightly decreased following thoracentesis. No pneumothorax. Diffuse interstitial prominence throughout the lungs is similar prior study. Continued right lower lobe airspace opacity, favor atelectasis although pneumonia cannot be excluded. Mild cardiomegaly. Aortic atherosclerosis. No acute bony abnormality. IMPRESSION: Slight decrease in the size of the moderate right pleural effusion following thoracentesis. No pneumothorax. Continued probable interstitial edema. Right lower lobe atelectasis or pneumonia. Electronically Signed   By: Franky Crease M.D.   On: 08/30/2024 11:44   DG  Chest 2 View Result Date: 08/28/2024 CLINICAL DATA:  Right pleural effusion. History of right lung cancer. EXAM: CHEST - 2 VIEW COMPARISON:  August 22, 2024 FINDINGS: Stable cardiomediastinal silhouette. Moderate size right pleural effusion is noted. Mild reticular densities are noted throughout both lungs concerning for pulmonary edema. Bony thorax is unremarkable. IMPRESSION: Moderate size right pleural effusion. Probable mild pulmonary edema. Electronically Signed   By: Lynwood Landy Raddle M.D.   On: 08/28/2024 15:48     ASSESSMENT/PLAN:  This is a very pleasant 73 year old African-American female with:  1) history of stage Ia non-small cell lung cancer, adenocarcinoma the right upper lobe who under went radiation under the care of Dr. Patrcia from 04/23/2022-04/29/22 2  Recurrent lung cancer, initially diagnosed as Stage IIIa confirm (T3, N2, M0) non-small cell lung cancer, squamous cell carcinoma. She presented with recurrent tumor at the right lung base, associated right lower lobe metastasis, and right hilar and subcarinal nodal metastases. This was diagnosed in October 2024.    PDL1 expression 85%.    The patient is scheduled to undergo consolidation immunotherapy with Imfinzi  1500 mg IV every 4 weeks . First dose on 02/07/24.  She is status post 1 cycle.  Her treatment was discontinued due to concerns with pneumonitis.   She was found to have disease progresion in July 2025.    Therefore, Dr. Sherrod recommended palliative systemic chemotherapy with carboplatin  for an AUC of 5 and taxol  175 mg/m2 and Keytruda  200 mg IV every 3 weeks.  She is status post 2 cycle. She underwent cycle #1 on 07/23/24. Starting from cycle #2, Dr. Sherrod reduced the dose of carboplatin  to an AUC of 4 and taxol  to 150 mg/m2. We previously discussed if intolerance, that we may discontinue the chemotherapy and proceed on single agent immunotherapy with keytruda .    The patient was seen by Dr. Sherrod today. Labs were  reviewed. Dr. Sherrod recommends discontinuing her carboplatin  and taxol  due to intolerance. She will just proceed on single agent immunotherapy.   Arrange for chest x-ray today to assess the status of her effusion.  She is not interested in a PleurX catheter.  She is expected to follow-up with pulmonary medicine on 10/05/2024.  She no longer is requiring Neulasta  injections.  I have canceled these appointments.  She also does not need weekly labs.  She is scheduled to see member the nutritionist team today.  She is also scheduled to see palliative care on 10/02/2024.  The chemotherapy may be causing taste alteration she was advised to continue taking her treatment for her oral thrush and use salt water rinses and baking soda.  I sent her nystatin  powder for intertrigo.  Will arrange for restaging CT scan of the chest, abdomen, and pelvis to be performed in approximately 2 weeks prior to her next appointment.  She will continue with stool softeners to help with her constipation secondary to her iron supplement.  - Continue tramadol as needed. We will assess this area on CXR and CT scan.  - Recommend use of heating pad at home. - Consider Gas-X for possible gas-related pain.  We will see her back in 3 weeks for evaluation and repeat blood work before undergoing cycle #4  The patient was advised to call immediately if she has any concerning symptoms in the interval. The patient voices understanding of current disease status and treatment options and is in agreement with the current care plan. All questions were answered. The patient knows to call the clinic with any problems, questions or concerns. We can certainly see the patient much sooner if necessary    Orders Placed This Encounter  Procedures   DG Chest 2 View    Standing Status:   Future    Expected Date:   09/24/2024    Expiration Date:   09/24/2025    Reason for Exam (SYMPTOM  OR DIAGNOSIS REQUIRED):   Reasses pleural effusion     Preferred imaging location?:   [facility]   CT CHEST ABDOMEN PELVIS W CONTRAST    Standing Status:   Future    Expected Date:   10/08/2024    Expiration Date:   09/24/2025    If indicated for the ordered  procedure, I authorize the administration of contrast media per Radiology protocol:   Yes    Does the patient have a contrast media/X-ray dye allergy ?:   No    Preferred imaging location?:   [facility]    If indicated for the ordered procedure, I authorize the administration of oral contrast media per Radiology protocol:   Yes      Romelle Muldoon L Adrionna Delcid, PA-C 09/24/24  ADDENDUM: Hematology/Oncology Attending: I had a face-to-face encounter with the patient today.  I reviewed her records, lab, and recommended her care plan.  This is a very pleasant 73 years old African-American female with recurrent non-small cell lung cancer that was initially diagnosed as a stage IIIa status post a course of concurrent chemoradiation followed by 1 cycle of consolidation treatment with immunotherapy discontinued secondary to suspicious pneumonitis.  The patient had evidence for disease recurrence in October 2024 and started palliative systemic chemotherapy with carboplatin , paclitaxel  and Keytruda  status post 2 cycles.  She had frequent hospitalization to the hospital after every cycle of her treatment with suspicious pneumonia. She is currently a resident at skilled nursing facility. I recommended for the patient to discontinue her chemotherapy and we will keep her on single agent Keytruda  for now PD-L1 expression is very high. She will proceed with cycle #3 with single agent Keytruda  today. She will come back for follow-up visit in 3 weeks for evaluation before starting the next cycle of her treatment. The patient was advised to call immediately if she has any other concerning symptoms in the interval. The total time spent in the appointment was 30 minutes including review of  chart and various tests results, discussions about plan of care and coordination of care plan . Disclaimer: This note was dictated with voice recognition software. Similar sounding words can inadvertently be transcribed and may be missed upon review. Sherrod MARLA Sherrod, MD

## 2024-09-24 ENCOUNTER — Ambulatory Visit (HOSPITAL_COMMUNITY)
Admission: RE | Admit: 2024-09-24 | Discharge: 2024-09-24 | Disposition: A | Discharge status: Home / Self Care | Source: Ambulatory Visit | Attending: Physician Assistant | Admitting: Physician Assistant

## 2024-09-24 ENCOUNTER — Inpatient Hospital Stay

## 2024-09-24 ENCOUNTER — Ambulatory Visit: Admitting: Physician Assistant

## 2024-09-24 ENCOUNTER — Ambulatory Visit

## 2024-09-24 ENCOUNTER — Telehealth: Payer: Self-pay

## 2024-09-24 ENCOUNTER — Other Ambulatory Visit: Payer: Self-pay | Admitting: Physician Assistant

## 2024-09-24 ENCOUNTER — Encounter: Payer: Self-pay | Admitting: Internal Medicine

## 2024-09-24 ENCOUNTER — Inpatient Hospital Stay: Admitting: Physician Assistant

## 2024-09-24 ENCOUNTER — Other Ambulatory Visit

## 2024-09-24 VITALS — BP 128/66 | HR 101 | Temp 97.6°F | Resp 16 | Ht 64.0 in | Wt 138.0 lb

## 2024-09-24 VITALS — BP 121/61 | HR 108 | Temp 97.8°F | Resp 18

## 2024-09-24 DIAGNOSIS — D649 Anemia, unspecified: Secondary | ICD-10-CM

## 2024-09-24 DIAGNOSIS — J910 Malignant pleural effusion: Secondary | ICD-10-CM

## 2024-09-24 DIAGNOSIS — Z7982 Long term (current) use of aspirin: Secondary | ICD-10-CM | POA: Diagnosis not present

## 2024-09-24 DIAGNOSIS — C3431 Malignant neoplasm of lower lobe, right bronchus or lung: Secondary | ICD-10-CM | POA: Diagnosis not present

## 2024-09-24 DIAGNOSIS — Z79899 Other long term (current) drug therapy: Secondary | ICD-10-CM | POA: Diagnosis not present

## 2024-09-24 DIAGNOSIS — J90 Pleural effusion, not elsewhere classified: Secondary | ICD-10-CM | POA: Diagnosis not present

## 2024-09-24 DIAGNOSIS — Z7984 Long term (current) use of oral hypoglycemic drugs: Secondary | ICD-10-CM | POA: Diagnosis not present

## 2024-09-24 DIAGNOSIS — R042 Hemoptysis: Secondary | ICD-10-CM | POA: Diagnosis not present

## 2024-09-24 DIAGNOSIS — Z794 Long term (current) use of insulin: Secondary | ICD-10-CM | POA: Diagnosis not present

## 2024-09-24 DIAGNOSIS — Z85118 Personal history of other malignant neoplasm of bronchus and lung: Secondary | ICD-10-CM | POA: Diagnosis not present

## 2024-09-24 DIAGNOSIS — Z5111 Encounter for antineoplastic chemotherapy: Secondary | ICD-10-CM | POA: Diagnosis present

## 2024-09-24 DIAGNOSIS — J439 Emphysema, unspecified: Secondary | ICD-10-CM | POA: Diagnosis not present

## 2024-09-24 DIAGNOSIS — E119 Type 2 diabetes mellitus without complications: Secondary | ICD-10-CM | POA: Diagnosis not present

## 2024-09-24 DIAGNOSIS — Z5112 Encounter for antineoplastic immunotherapy: Secondary | ICD-10-CM

## 2024-09-24 DIAGNOSIS — C7801 Secondary malignant neoplasm of right lung: Secondary | ICD-10-CM | POA: Diagnosis not present

## 2024-09-24 DIAGNOSIS — C772 Secondary and unspecified malignant neoplasm of intra-abdominal lymph nodes: Secondary | ICD-10-CM | POA: Diagnosis not present

## 2024-09-24 DIAGNOSIS — L304 Erythema intertrigo: Secondary | ICD-10-CM

## 2024-09-24 DIAGNOSIS — B370 Candidal stomatitis: Secondary | ICD-10-CM | POA: Diagnosis not present

## 2024-09-24 DIAGNOSIS — M1990 Unspecified osteoarthritis, unspecified site: Secondary | ICD-10-CM | POA: Diagnosis not present

## 2024-09-24 DIAGNOSIS — Z9221 Personal history of antineoplastic chemotherapy: Secondary | ICD-10-CM | POA: Diagnosis not present

## 2024-09-24 DIAGNOSIS — J849 Interstitial pulmonary disease, unspecified: Secondary | ICD-10-CM | POA: Diagnosis not present

## 2024-09-24 DIAGNOSIS — Z923 Personal history of irradiation: Secondary | ICD-10-CM | POA: Diagnosis not present

## 2024-09-24 DIAGNOSIS — I700 Atherosclerosis of aorta: Secondary | ICD-10-CM | POA: Diagnosis not present

## 2024-09-24 DIAGNOSIS — I119 Hypertensive heart disease without heart failure: Secondary | ICD-10-CM | POA: Diagnosis not present

## 2024-09-24 DIAGNOSIS — Z9981 Dependence on supplemental oxygen: Secondary | ICD-10-CM | POA: Diagnosis not present

## 2024-09-24 DIAGNOSIS — C3411 Malignant neoplasm of upper lobe, right bronchus or lung: Secondary | ICD-10-CM | POA: Diagnosis present

## 2024-09-24 DIAGNOSIS — R630 Anorexia: Secondary | ICD-10-CM | POA: Diagnosis not present

## 2024-09-24 DIAGNOSIS — R000 Tachycardia, unspecified: Secondary | ICD-10-CM | POA: Diagnosis not present

## 2024-09-24 LAB — CBC WITH DIFFERENTIAL (CANCER CENTER ONLY)
Abs Immature Granulocytes: 0.03 K/uL (ref 0.00–0.07)
Basophils Absolute: 0.1 K/uL (ref 0.0–0.1)
Basophils Relative: 1 %
Eosinophils Absolute: 0.2 K/uL (ref 0.0–0.5)
Eosinophils Relative: 2 %
HCT: 31.3 % — ABNORMAL LOW (ref 36.0–46.0)
Hemoglobin: 9.5 g/dL — ABNORMAL LOW (ref 12.0–15.0)
Immature Granulocytes: 0 %
Lymphocytes Relative: 16 %
Lymphs Abs: 1.5 K/uL (ref 0.7–4.0)
MCH: 25.1 pg — ABNORMAL LOW (ref 26.0–34.0)
MCHC: 30.4 g/dL (ref 30.0–36.0)
MCV: 82.8 fL (ref 80.0–100.0)
Monocytes Absolute: 0.9 K/uL (ref 0.1–1.0)
Monocytes Relative: 9 %
Neutro Abs: 6.9 K/uL (ref 1.7–7.7)
Neutrophils Relative %: 72 %
Platelet Count: 438 K/uL — ABNORMAL HIGH (ref 150–400)
RBC: 3.78 MIL/uL — ABNORMAL LOW (ref 3.87–5.11)
RDW: 18 % — ABNORMAL HIGH (ref 11.5–15.5)
WBC Count: 9.6 K/uL (ref 4.0–10.5)
nRBC: 0 % (ref 0.0–0.2)

## 2024-09-24 LAB — CMP (CANCER CENTER ONLY)
ALT: <5 U/L (ref 0–44)
AST: 10 U/L — ABNORMAL LOW (ref 15–41)
Albumin: 3.2 g/dL — ABNORMAL LOW (ref 3.5–5.0)
Alkaline Phosphatase: 77 U/L (ref 38–126)
Anion gap: 10 (ref 5–15)
BUN: 12 mg/dL (ref 8–23)
CO2: 25 mmol/L (ref 22–32)
Calcium: 9.9 mg/dL (ref 8.9–10.3)
Chloride: 103 mmol/L (ref 98–111)
Creatinine: 0.57 mg/dL (ref 0.44–1.00)
GFR, Estimated: >60 mL/min (ref >60)
Glucose, Bld: 119 mg/dL — ABNORMAL HIGH (ref 70–99)
Potassium: 3.6 mmol/L (ref 3.5–5.1)
Sodium: 138 mmol/L (ref 135–145)
Total Bilirubin: 0.2 mg/dL (ref 0.0–1.2)
Total Protein: 7.3 g/dL (ref 6.5–8.1)

## 2024-09-24 LAB — TSH: TSH: 1.35 u[IU]/mL (ref 0.350–4.500)

## 2024-09-24 LAB — SAMPLE TO BLOOD BANK

## 2024-09-24 MED ORDER — ACETAMINOPHEN 325 MG PO TABS
650.0000 mg | ORAL_TABLET | Freq: Four times a day (QID) | ORAL | Status: DC | PRN
  Administered 2024-09-24: 650 mg via ORAL
  Filled 2024-09-24: qty 2

## 2024-09-24 MED ORDER — SODIUM CHLORIDE 0.9 % IV SOLN
200.0000 mg | Freq: Once | INTRAVENOUS | Status: AC
  Administered 2024-09-24: 200 mg via INTRAVENOUS
  Filled 2024-09-24: qty 200

## 2024-09-24 MED ORDER — SODIUM CHLORIDE 0.9 % IV SOLN: INTRAVENOUS | Status: DC

## 2024-09-24 MED ORDER — NYSTATIN 100000 UNIT/GM EX POWD: 1.0000 | Freq: Three times a day (TID) | CUTANEOUS | 0 refills | Status: DC

## 2024-09-24 NOTE — Patient Instructions (Signed)

## 2024-09-24 NOTE — Telephone Encounter (Signed)
 Spoke with patient regarding chest X-ray results. Per Cassie, PA, the patient has a large right pleural effusion and would benefit from a thoracentesis. Patient voiced understanding.  Contacted Centralized Scheduling and scheduled the patient for thoracentesis tomorrow at 11:30 A.M. at [facility].  Spoke with Cy at [facility] [phone]) to confirm the appointment for tomorrow at 11:30 A.M.

## 2024-09-24 NOTE — Progress Notes (Signed)
 Nutrition Assessment   Reason for Assessment:  Referral from inpatient RD team   ASSESSMENT:   73 year old female with non small cell lung cancer recurrent.  Past medical history of COPD, DM, CAD, PE, HTN, HLD, MDD, diverticulosis.  Recent hospitalization for new pleural effusion.  Patient to receive immunotherapy (keytruda ) only today.   Met with patient during infusion. Reports that appetite is up and down.  Currently at [facility] and does not like the food.  Reports that family is bringing in food for her to eat.  Drinks glucerna shakes.  Says that she is unable to eat meat at this time due to taste/texture.    Nutrition Focused Physical Exam:   Unable to perform   Medications: nystatin , aspart, metformin , compazine , glipzide, zofran , senokot, miralax , ferrous sulfate    Labs: reviewed   Anthropometrics:   Height: 64 inches Weight: 138 lb 167 lb on 8/29 (hospital admission) BMI: 23  7/8 153 lb  153 lb on 06/05/24  9% weight loss in the last 3 months, significant   Estimated Energy Needs  Kcals: 1575-1900 Protein: 78-95 g Fluid: 1575-1900 ml   NUTRITION DIAGNOSIS: Inadequate oral intake related to cancer and related treatment side effects,  dislikes foods at SNF as evidenced by 9% weight loss in the last 3 months and decreased appetite    INTERVENTION:  Reviewed other foods that contain protein other than meat Continue glucerna shake. Coupons given Family to continue bringing in foods for patient.  Discussed snack options to keep in room   MONITORING, EVALUATION, GOAL: weight trends, intake   Next Visit: Monday, Oct 20 during infusion  Eddy Liszewski B. Dasie SOLON, CSO, LDN Registered Dietitian [phone]

## 2024-09-24 NOTE — Progress Notes (Signed)
 Per Cassie, PA-C, patient will only receive Keytruda  today, so premeds have been removed.  Harlene Nasuti, PharmD Oncology Infusion Pharmacist 09/24/2024 11:38 AM

## 2024-09-25 ENCOUNTER — Telehealth: Payer: Self-pay | Admitting: Physician Assistant

## 2024-09-25 ENCOUNTER — Ambulatory Visit (HOSPITAL_COMMUNITY)
Admission: RE | Admit: 2024-09-25 | Discharge: 2024-09-25 | Disposition: A | Discharge status: Home / Self Care | Source: Ambulatory Visit | Attending: Physician Assistant | Admitting: Physician Assistant

## 2024-09-25 ENCOUNTER — Ambulatory Visit (HOSPITAL_COMMUNITY)
Admission: RE | Admit: 2024-09-25 | Discharge: 2024-09-25 | Disposition: A | Source: Ambulatory Visit | Attending: Radiology | Admitting: Radiology

## 2024-09-25 ENCOUNTER — Ambulatory Visit: Admitting: Internal Medicine

## 2024-09-25 ENCOUNTER — Ambulatory Visit (HOSPITAL_COMMUNITY)

## 2024-09-25 ENCOUNTER — Ambulatory Visit (HOSPITAL_COMMUNITY): Admission: RE | Admit: 2024-09-25 | Source: Ambulatory Visit

## 2024-09-25 ENCOUNTER — Other Ambulatory Visit

## 2024-09-25 ENCOUNTER — Ambulatory Visit

## 2024-09-25 DIAGNOSIS — J910 Malignant pleural effusion: Secondary | ICD-10-CM

## 2024-09-25 DIAGNOSIS — J90 Pleural effusion, not elsewhere classified: Secondary | ICD-10-CM | POA: Diagnosis present

## 2024-09-25 LAB — T4: T4, Total: 8.9 ug/dL (ref 4.5–12.0)

## 2024-09-25 MED ORDER — ACETAMINOPHEN 500 MG PO TABS: 1000.0000 mg | ORAL_TABLET | Freq: Once | ORAL | Status: DC

## 2024-09-25 MED ORDER — LIDOCAINE-EPINEPHRINE 1 %-1:100000 IJ SOLN
20.0000 mL | Freq: Once | INTRAMUSCULAR | Status: AC
  Administered 2024-09-25: 8 mL via INTRADERMAL

## 2024-09-25 MED ORDER — LIDOCAINE-EPINEPHRINE 1 %-1:100000 IJ SOLN
INTRAMUSCULAR | Status: AC
  Filled 2024-09-25: qty 1

## 2024-09-25 NOTE — Procedures (Signed)
 Ultrasound-guided  therapeutic right thoracentesis performed yielding 700 cc of slightly hazy, yellow fluid. No immediate complications. Follow-up chest x-ray pending. EBL none. US  of right post chest today revealed small- moderate amount of free pleural fluid.

## 2024-09-26 ENCOUNTER — Inpatient Hospital Stay

## 2024-09-26 ENCOUNTER — Ambulatory Visit

## 2024-09-26 NOTE — Progress Notes (Deleted)
 Cardiology Office Note:    Date:  09/26/2024   ID:  Lindsey Williamson, DOB 10-05-1951, MRN [id number]  PCP:  [name], MD   [facility] Providers Cardiologist:  Maude Emmer, MD { Click to update primary MD,subspecialty MD or APP then REFRESH:1}    Referring MD: [name], MD   No chief complaint on file. ***  History of Present Illness:    Lindsey Williamson is a 73 y.o. female with a hx of NSCLC stage 3, COPD, ILD, chronic respiratory failure on home O2, nonobstructive CAD, HTN, DM2, and ectopic atrial tachycardia.   LHC 04/2023 with mild nonobstructive CAD. Echo 01/2024 with LVEF 65-70%, no RWMA, mild LVH, and normal RV size and function. No significant valvular disease.   She was recently hospitalized with worsening SOB found to be tachycardic. Tachycardia felt related to ectopic atrial tachycardia likely with deterioration in pulmonary status. Continued on bisoprolol  and diltiazem .  Heart monitor demonstrated __________  She presents for cardiology follow up.     Ectopic atrial tachycardia - felt related to pulmonary status - tolerating bisoprolol  and cardizem    Hypertension - continue 10 mg bisoprolol  and 360 mg cardizem    Nonobstructive CAD - no chest pain - continue ASA    Past Medical History:  Diagnosis Date   Angioedema 10/17/2023   Arthritis    Diabetes mellitus without complication (HCC)    Hypertension     Past Surgical History:  Procedure Laterality Date   BRONCHIAL BIOPSY  03/30/2022   Procedure: BRONCHIAL BIOPSIES;  Surgeon: Brenna Adine CROME, DO;  Location: [facility] ENDOSCOPY;  Service: Pulmonary;;   BRONCHIAL BIOPSY  10/17/2023   Procedure: BRONCHIAL BIOPSIES;  Surgeon: Shelah Lamar RAMAN, MD;  Location: [facility] ENDOSCOPY;  Service: Pulmonary;;   BRONCHIAL BRUSHINGS  03/30/2022   Procedure: BRONCHIAL BRUSHINGS;  Surgeon: Brenna Adine CROME, DO;  Location: [facility] ENDOSCOPY;  Service: Pulmonary;;   BRONCHIAL BRUSHINGS  10/17/2023   Procedure:  BRONCHIAL BRUSHINGS;  Surgeon: Shelah Lamar RAMAN, MD;  Location: [facility] ENDOSCOPY;  Service: Pulmonary;;   BRONCHIAL NEEDLE ASPIRATION BIOPSY  03/30/2022   Procedure: BRONCHIAL NEEDLE ASPIRATION BIOPSIES;  Surgeon: Brenna Adine CROME, DO;  Location: [facility] ENDOSCOPY;  Service: Pulmonary;;   BRONCHIAL NEEDLE ASPIRATION BIOPSY  10/17/2023   Procedure: BRONCHIAL NEEDLE ASPIRATION BIOPSIES;  Surgeon: Shelah Lamar RAMAN, MD;  Location: [facility] ENDOSCOPY;  Service: Pulmonary;;   BRONCHIAL WASHINGS  05/19/2024   Procedure: IRRIGATION, BRONCHUS;  Surgeon: Jude Harden GAILS, MD;  Location: [facility] ENDOSCOPY;  Service: Cardiopulmonary;;   EYE SURGERY     FIDUCIAL MARKER PLACEMENT  03/30/2022   Procedure: FIDUCIAL MARKER PLACEMENT;  Surgeon: Brenna Adine CROME, DO;  Location: [facility] ENDOSCOPY;  Service: Pulmonary;;   FINE NEEDLE ASPIRATION  10/17/2023   Procedure: FINE NEEDLE ASPIRATION;  Surgeon: Shelah Lamar RAMAN, MD;  Location: [facility] ENDOSCOPY;  Service: Pulmonary;;   FOREIGN BODY REMOVAL  03/30/2022   Procedure: FOREIGN BODY REMOVAL;  Surgeon: Brenna Adine CROME, DO;  Location: [facility] ENDOSCOPY;  Service: Pulmonary;;   HEMOSTASIS CONTROL  10/17/2023   Procedure: HEMOSTASIS CONTROL;  Surgeon: Shelah Lamar RAMAN, MD;  Location: [facility] ENDOSCOPY;  Service: Pulmonary;;   IR THORACENTESIS ASP PLEURAL SPACE W/IMG GUIDE  08/21/2024   IR THORACENTESIS ASP PLEURAL SPACE W/IMG GUIDE  08/30/2024   IR THORACENTESIS ASP PLEURAL SPACE W/IMG GUIDE  09/25/2024   VIDEO BRONCHOSCOPY Bilateral 05/19/2024   Procedure: VIDEO BRONCHOSCOPY WITHOUT FLUORO;  Surgeon: Jude Harden GAILS, MD;  Location: [facility] ENDOSCOPY;  Service: Cardiopulmonary;  Laterality: Bilateral;   VIDEO BRONCHOSCOPY WITH ENDOBRONCHIAL ULTRASOUND Bilateral 03/30/2022   Procedure: VIDEO BRONCHOSCOPY WITH ENDOBRONCHIAL ULTRASOUND;  Surgeon: Brenna Adine CROME, DO;  Location: [facility] ENDOSCOPY;  Service: Pulmonary;  Laterality: Bilateral;   VIDEO BRONCHOSCOPY WITH ENDOBRONCHIAL ULTRASOUND Right 10/17/2023   Procedure: VIDEO  BRONCHOSCOPY WITH ENDOBRONCHIAL ULTRASOUND;  Surgeon: Shelah Lamar RAMAN, MD;  Location: [facility] ENDOSCOPY;  Service: Pulmonary;  Laterality: Right;   VIDEO BRONCHOSCOPY WITH RADIAL ENDOBRONCHIAL ULTRASOUND  03/30/2022   Procedure: VIDEO BRONCHOSCOPY WITH RADIAL ENDOBRONCHIAL ULTRASOUND;  Surgeon: Brenna Adine CROME, DO;  Location: [facility] ENDOSCOPY;  Service: Pulmonary;;    Current Medications: No outpatient medications have been marked as taking for the 10/01/24 encounter (Appointment) with Madie Jon Garre, PA.     Allergies:   Benzonatate, Lisinopril, Aspirin , Azithromycin , and Codeine    Social History   Socioeconomic History   Marital status: Married    Spouse name: Not on file   Number of children: Not on file   Years of education: Not on file   Highest education level: Not on file  Occupational History   Not on file  Tobacco Use   Smoking status: Former    Current packs/day: 0.00    Types: Cigarettes    Quit date: 2024    Years since quitting: 1.7    Passive exposure: Past   Smokeless tobacco: Never  Vaping Use   Vaping status: Never Used  Substance and Sexual Activity   Alcohol use: No   Drug use: Never   Sexual activity: Not Currently  Other Topics Concern   Not on file  Social History Narrative   Not on file   Social Drivers of Health   Financial Resource Strain: Low Risk  (04/24/2024)   Received from [facility]   Overall Financial Resource Strain (CARDIA)    Difficulty of Paying Living Expenses: Not very hard  Food Insecurity: No Food Insecurity (09/10/2024)   Hunger Vital Sign    Worried About Running Out of Food in the Last Year: Never true    Ran Out of Food in the Last Year: Never true  Transportation Needs: No Transportation Needs (09/10/2024)   PRAPARE - Administrator, Civil Service (Medical): No    Lack of Transportation (Non-Medical): No  Physical Activity: Inactive (04/24/2024)   Received from [facility]   Exercise Vital Sign    On average,  how many days per week do you engage in moderate to strenuous exercise (like a brisk walk)?: 0 days    On average, how many minutes do you engage in exercise at this level?: 20 min  Stress: Stress Concern Present (04/24/2024)   Received from [facility] - Occupational Stress Questionnaire    Feeling of Stress : Rather much  Social Connections: Moderately Integrated (09/10/2024)   Social Connection and Isolation Panel    Frequency of Communication with Friends and Family: More than three times a week    Frequency of Social Gatherings with Friends and Family: Never    Attends Religious Services: Never    Database administrator or Organizations: Yes    Attends Banker Meetings: Never    Marital Status: Married     Family History: The patient's ***family history includes Allergic rhinitis in her sister; Asthma in her sister. There is no history of Angioedema, Atopy, Immunodeficiency, Urticaria, or Eczema.  ROS:   Please see the history of present illness.    *** All other  systems reviewed and are negative.  EKGs/Labs/Other Studies Reviewed:    The following studies were reviewed today: ***      Recent Labs: 04/10/2024: B Natriuretic Peptide 90.9 09/10/2024: Pro Brain Natriuretic Peptide 467.0 09/12/2024: Magnesium  1.4 09/24/2024: ALT <5; BUN 12; Creatinine 0.57; Hemoglobin 9.5; Platelet Count 438; Potassium 3.6; Sodium 138; TSH 1.350  Recent Lipid Panel    Component Value Date/Time   CHOL 117 04/07/2024 0436   TRIG 54 04/07/2024 0436   HDL 46 04/07/2024 0436   CHOLHDL 2.5 04/07/2024 0436   VLDL 11 04/07/2024 0436   LDLCALC 60 04/07/2024 0436     Risk Assessment/Calculations:   {Does this patient have ATRIAL FIBRILLATION?:[phone]}  No BP recorded.  {Refresh Note OR Click here to enter BP  :1}***         Physical Exam:    VS:  There were no vitals taken for this visit.    Wt Readings from Last 3 Encounters:   09/24/24 138 lb (62.6 kg)  09/10/24 142 lb 3.2 oz (64.5 kg)  09/04/24 143 lb 12 oz (65.2 kg)     GEN: *** Well nourished, well developed in no acute distress HEENT: Normal NECK: No JVD; No carotid bruits LYMPHATICS: No lymphadenopathy CARDIAC: ***RRR, no murmurs, rubs, gallops RESPIRATORY:  Clear to auscultation without rales, wheezing or rhonchi  ABDOMEN: Soft, non-tender, non-distended MUSCULOSKELETAL:  No edema; No deformity  SKIN: Warm and dry NEUROLOGIC:  Alert and oriented x 3 PSYCHIATRIC:  Normal affect   ASSESSMENT:    No diagnosis found. PLAN:    In order of problems listed above:  ***      {Are you ordering a CV Procedure (e.g. stress test, cath, DCCV, TEE, etc)?   Press F2        :[id number]}    Medication Adjustments/Labs and Tests Ordered: Current medicines are reviewed at length with the patient today.  Concerns regarding medicines are outlined above.  No orders of the defined types were placed in this encounter.  No orders of the defined types were placed in this encounter.   There are no Patient Instructions on file for this visit.   Signed, Jon Garre Neema Fluegge, PA  09/26/2024 10:22 AM    [facility]

## 2024-09-27 ENCOUNTER — Ambulatory Visit

## 2024-09-30 ENCOUNTER — Other Ambulatory Visit: Payer: Self-pay

## 2024-10-01 ENCOUNTER — Other Ambulatory Visit

## 2024-10-01 ENCOUNTER — Ambulatory Visit: Admitting: Physician Assistant

## 2024-10-01 ENCOUNTER — Inpatient Hospital Stay

## 2024-10-01 ENCOUNTER — Ambulatory Visit

## 2024-10-01 ENCOUNTER — Ambulatory Visit: Admitting: Internal Medicine

## 2024-10-02 ENCOUNTER — Encounter: Payer: Self-pay | Admitting: Nurse Practitioner

## 2024-10-02 ENCOUNTER — Inpatient Hospital Stay (HOSPITAL_BASED_OUTPATIENT_CLINIC_OR_DEPARTMENT_OTHER): Admitting: Nurse Practitioner

## 2024-10-02 ENCOUNTER — Inpatient Hospital Stay: Attending: Radiation Oncology

## 2024-10-02 VITALS — BP 128/70 | HR 93 | Temp 97.5°F | Resp 18 | Wt 136.9 lb

## 2024-10-02 DIAGNOSIS — C3411 Malignant neoplasm of upper lobe, right bronchus or lung: Secondary | ICD-10-CM | POA: Diagnosis not present

## 2024-10-02 DIAGNOSIS — Z5111 Encounter for antineoplastic chemotherapy: Secondary | ICD-10-CM | POA: Insufficient documentation

## 2024-10-02 DIAGNOSIS — Z794 Long term (current) use of insulin: Secondary | ICD-10-CM | POA: Insufficient documentation

## 2024-10-02 DIAGNOSIS — R5383 Other fatigue: Secondary | ICD-10-CM | POA: Insufficient documentation

## 2024-10-02 DIAGNOSIS — J439 Emphysema, unspecified: Secondary | ICD-10-CM | POA: Insufficient documentation

## 2024-10-02 DIAGNOSIS — Z7189 Other specified counseling: Secondary | ICD-10-CM

## 2024-10-02 DIAGNOSIS — Z515 Encounter for palliative care: Secondary | ICD-10-CM

## 2024-10-02 DIAGNOSIS — I700 Atherosclerosis of aorta: Secondary | ICD-10-CM | POA: Insufficient documentation

## 2024-10-02 DIAGNOSIS — Z7984 Long term (current) use of oral hypoglycemic drugs: Secondary | ICD-10-CM | POA: Diagnosis not present

## 2024-10-02 DIAGNOSIS — E119 Type 2 diabetes mellitus without complications: Secondary | ICD-10-CM | POA: Insufficient documentation

## 2024-10-02 DIAGNOSIS — G893 Neoplasm related pain (acute) (chronic): Secondary | ICD-10-CM | POA: Diagnosis not present

## 2024-10-02 DIAGNOSIS — K5900 Constipation, unspecified: Secondary | ICD-10-CM

## 2024-10-02 DIAGNOSIS — J9610 Chronic respiratory failure, unspecified whether with hypoxia or hypercapnia: Secondary | ICD-10-CM | POA: Insufficient documentation

## 2024-10-02 DIAGNOSIS — I119 Hypertensive heart disease without heart failure: Secondary | ICD-10-CM | POA: Diagnosis not present

## 2024-10-02 DIAGNOSIS — Z7982 Long term (current) use of aspirin: Secondary | ICD-10-CM | POA: Diagnosis not present

## 2024-10-02 DIAGNOSIS — Z923 Personal history of irradiation: Secondary | ICD-10-CM | POA: Insufficient documentation

## 2024-10-02 DIAGNOSIS — R110 Nausea: Secondary | ICD-10-CM | POA: Insufficient documentation

## 2024-10-02 DIAGNOSIS — M129 Arthropathy, unspecified: Secondary | ICD-10-CM | POA: Insufficient documentation

## 2024-10-02 DIAGNOSIS — R530 Neoplastic (malignant) related fatigue: Secondary | ICD-10-CM

## 2024-10-02 DIAGNOSIS — R0602 Shortness of breath: Secondary | ICD-10-CM

## 2024-10-02 DIAGNOSIS — J910 Malignant pleural effusion: Secondary | ICD-10-CM | POA: Diagnosis not present

## 2024-10-02 DIAGNOSIS — R630 Anorexia: Secondary | ICD-10-CM

## 2024-10-02 DIAGNOSIS — Z79899 Other long term (current) drug therapy: Secondary | ICD-10-CM | POA: Diagnosis not present

## 2024-10-02 DIAGNOSIS — Z87891 Personal history of nicotine dependence: Secondary | ICD-10-CM | POA: Diagnosis not present

## 2024-10-02 DIAGNOSIS — C3431 Malignant neoplasm of lower lobe, right bronchus or lung: Secondary | ICD-10-CM

## 2024-10-02 DIAGNOSIS — D649 Anemia, unspecified: Secondary | ICD-10-CM

## 2024-10-02 DIAGNOSIS — R634 Abnormal weight loss: Secondary | ICD-10-CM

## 2024-10-02 LAB — CBC WITH DIFFERENTIAL (CANCER CENTER ONLY)
Abs Immature Granulocytes: 0.04 K/uL (ref 0.00–0.07)
Basophils Absolute: 0.1 K/uL (ref 0.0–0.1)
Basophils Relative: 1 %
Eosinophils Absolute: 0.2 K/uL (ref 0.0–0.5)
Eosinophils Relative: 2 %
HCT: 28.6 % — ABNORMAL LOW (ref 36.0–46.0)
Hemoglobin: 8.8 g/dL — ABNORMAL LOW (ref 12.0–15.0)
Immature Granulocytes: 1 %
Lymphocytes Relative: 12 %
Lymphs Abs: 1.1 K/uL (ref 0.7–4.0)
MCH: 24.6 pg — ABNORMAL LOW (ref 26.0–34.0)
MCHC: 30.8 g/dL (ref 30.0–36.0)
MCV: 80.1 fL (ref 80.0–100.0)
Monocytes Absolute: 0.9 K/uL (ref 0.1–1.0)
Monocytes Relative: 11 %
Neutro Abs: 6.4 K/uL (ref 1.7–7.7)
Neutrophils Relative %: 73 %
Platelet Count: 530 K/uL — ABNORMAL HIGH (ref 150–400)
RBC: 3.57 MIL/uL — ABNORMAL LOW (ref 3.87–5.11)
RDW: 17.9 % — ABNORMAL HIGH (ref 11.5–15.5)
WBC Count: 8.6 K/uL (ref 4.0–10.5)
nRBC: 0 % (ref 0.0–0.2)

## 2024-10-02 LAB — CMP (CANCER CENTER ONLY)
ALT: <5 U/L (ref 0–44)
AST: 9 U/L — ABNORMAL LOW (ref 15–41)
Albumin: 3.1 g/dL — ABNORMAL LOW (ref 3.5–5.0)
Alkaline Phosphatase: 71 U/L (ref 38–126)
Anion gap: 8 (ref 5–15)
BUN: 6 mg/dL — ABNORMAL LOW (ref 8–23)
CO2: 26 mmol/L (ref 22–32)
Calcium: 10.1 mg/dL (ref 8.9–10.3)
Chloride: 102 mmol/L (ref 98–111)
Creatinine: 0.39 mg/dL — ABNORMAL LOW (ref 0.44–1.00)
GFR, Estimated: >60 mL/min (ref >60)
Glucose, Bld: 170 mg/dL — ABNORMAL HIGH (ref 70–99)
Potassium: 3.5 mmol/L (ref 3.5–5.1)
Sodium: 136 mmol/L (ref 135–145)
Total Bilirubin: 0.2 mg/dL (ref 0.0–1.2)
Total Protein: 7.3 g/dL (ref 6.5–8.1)

## 2024-10-02 LAB — SAMPLE TO BLOOD BANK

## 2024-10-02 NOTE — Progress Notes (Signed)
 Palliative Medicine [facility]  [phone] [phone]   Name: Lindsey Williamson Date: 10/02/2024 MRN: [id number]  DOB: 09-10-1951  Patient Care Team: [name], MD as PCP - General (Family Medicine) Delford Maude BROCKS, MD as PCP - Cardiology (Cardiology) Prentis Duwaine BROCKS, RN as Oncology Nurse Navigator Pickenpack-Cousar, Fannie SAILOR, NP as Nurse Practitioner (Hospice and Palliative Medicine) Silvano Valrie SQUIBB, RN as Registered Nurse    REASON FOR CONSULTATION: Lindsey Williamson is a 73 y.o. 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  Palliative is seeing patient for symptom management and goals of care.    SOCIAL HISTORY:     reports that she quit smoking about 21 months ago. Her smoking use included cigarettes. She has been exposed to tobacco smoke. She has never used smokeless tobacco. She reports that she does not drink alcohol and does not use drugs.  ADVANCE DIRECTIVES:  None on file  CODE STATUS: Limited code  PAST MEDICAL HISTORY: Past Medical History:  Diagnosis Date   Angioedema 10/17/2023   Arthritis    Diabetes mellitus without complication (HCC)    Hypertension     PAST SURGICAL HISTORY:  Past Surgical History:  Procedure Laterality Date   BRONCHIAL BIOPSY  03/30/2022   Procedure: BRONCHIAL BIOPSIES;  Surgeon: Brenna Adine CROME, DO;  Location: [facility] ENDOSCOPY;  Service: Pulmonary;;   BRONCHIAL BIOPSY  10/17/2023   Procedure: BRONCHIAL BIOPSIES;  Surgeon: Shelah Lamar RAMAN, MD;  Location: [facility] ENDOSCOPY;  Service:  Pulmonary;;   BRONCHIAL BRUSHINGS  03/30/2022   Procedure: BRONCHIAL BRUSHINGS;  Surgeon: Brenna Adine CROME, DO;  Location: [facility] ENDOSCOPY;  Service: Pulmonary;;   BRONCHIAL BRUSHINGS  10/17/2023   Procedure: BRONCHIAL BRUSHINGS;  Surgeon: Shelah Lamar RAMAN, MD;  Location: [facility] ENDOSCOPY;  Service: Pulmonary;;   BRONCHIAL NEEDLE ASPIRATION BIOPSY  03/30/2022   Procedure: BRONCHIAL NEEDLE ASPIRATION BIOPSIES;  Surgeon: Brenna Adine CROME, DO;  Location: [facility] ENDOSCOPY;  Service: Pulmonary;;   BRONCHIAL NEEDLE ASPIRATION BIOPSY  10/17/2023   Procedure: BRONCHIAL NEEDLE ASPIRATION BIOPSIES;  Surgeon: Shelah Lamar RAMAN, MD;  Location: [facility] ENDOSCOPY;  Service: Pulmonary;;   BRONCHIAL WASHINGS  05/19/2024   Procedure: IRRIGATION, BRONCHUS;  Surgeon: Jude Harden GAILS, MD;  Location: [facility] ENDOSCOPY;  Service: Cardiopulmonary;;   EYE SURGERY     FIDUCIAL MARKER PLACEMENT  03/30/2022   Procedure: FIDUCIAL MARKER PLACEMENT;  Surgeon: Brenna Adine CROME, DO;  Location: [facility] ENDOSCOPY;  Service: Pulmonary;;   FINE NEEDLE ASPIRATION  10/17/2023   Procedure: FINE NEEDLE ASPIRATION;  Surgeon: Shelah Lamar RAMAN, MD;  Location: [facility] ENDOSCOPY;  Service: Pulmonary;;   FOREIGN BODY REMOVAL  03/30/2022   Procedure: FOREIGN BODY REMOVAL;  Surgeon: Brenna Adine CROME, DO;  Location: [facility] ENDOSCOPY;  Service: Pulmonary;;   HEMOSTASIS CONTROL  10/17/2023   Procedure: HEMOSTASIS CONTROL;  Surgeon: Shelah Lamar RAMAN, MD;  Location: [facility] ENDOSCOPY;  Service: Pulmonary;;   IR THORACENTESIS ASP PLEURAL SPACE W/IMG GUIDE  08/21/2024   IR THORACENTESIS ASP PLEURAL SPACE W/IMG GUIDE  08/30/2024   IR THORACENTESIS ASP PLEURAL SPACE W/IMG GUIDE  09/25/2024   VIDEO BRONCHOSCOPY Bilateral 05/19/2024   Procedure: VIDEO BRONCHOSCOPY WITHOUT FLUORO;  Surgeon: Jude Harden  V, MD;  Location: [facility] ENDOSCOPY;  Service: Cardiopulmonary;  Laterality: Bilateral;   VIDEO BRONCHOSCOPY WITH ENDOBRONCHIAL ULTRASOUND Bilateral 03/30/2022   Procedure: VIDEO BRONCHOSCOPY WITH ENDOBRONCHIAL  ULTRASOUND;  Surgeon: Brenna Adine CROME, DO;  Location: [facility] ENDOSCOPY;  Service: Pulmonary;  Laterality: Bilateral;   VIDEO BRONCHOSCOPY WITH ENDOBRONCHIAL ULTRASOUND Right 10/17/2023   Procedure: VIDEO BRONCHOSCOPY WITH ENDOBRONCHIAL ULTRASOUND;  Surgeon: Shelah Lamar RAMAN, MD;  Location: [facility] ENDOSCOPY;  Service: Pulmonary;  Laterality: Right;   VIDEO BRONCHOSCOPY WITH RADIAL ENDOBRONCHIAL ULTRASOUND  03/30/2022   Procedure: VIDEO BRONCHOSCOPY WITH RADIAL ENDOBRONCHIAL ULTRASOUND;  Surgeon: Brenna Adine CROME, DO;  Location: [facility] ENDOSCOPY;  Service: Pulmonary;;    HEMATOLOGY/ONCOLOGY HISTORY:  Oncology History  Primary cancer of right lower lobe of lung (HCC)  04/08/2022 Initial Diagnosis   Primary cancer of right lower lobe of lung (HCC)   10/10/2023 Cancer Staging   Staging form: Lung, AJCC 8th Edition - Clinical stage from 10/10/2023: Stage IIIB (rcT3, cN2, cM0) - Signed by Sherrod Sherrod, MD on 10/25/2023 Histopathologic type: Squamous cell carcinoma, NOS Stage prefix: Recurrence Laterality: Right Specimen type: Bronchial Biopsy   11/07/2023 - 12/20/2023 Chemotherapy   Patient is on Treatment Plan : LUNG Carboplatin  + Paclitaxel  + XRT q7d     02/09/2024 - 02/09/2024 Chemotherapy   Patient is on Treatment Plan : LUNG NSCLC Durvalumab  (1500) q28d     07/23/2024 -  Chemotherapy   Patient is on Treatment Plan : LUNG NSCLC Carboplatin  (6) + Paclitaxel  (200) + Pembrolizumab  (200) D1 q21d x 4 cycles / Pembrolizumab  (200) Maintenance D1 q21d       ALLERGIES:  is allergic to benzonatate, lisinopril, aspirin , azithromycin , and codeine .  MEDICATIONS:  Current Outpatient Medications  Medication Sig Dispense Refill   albuterol  (VENTOLIN  HFA) 108 (90 Base) MCG/ACT inhaler Inhale 2 puffs into the lungs every 6 (six) hours as needed for wheezing or shortness of breath. 20.1 g 2   aspirin  EC 81 MG tablet Take 81 mg by mouth daily. Swallow whole.     bisoprolol  (ZEBETA ) 10 MG tablet Take 1 tablet (10  mg total) by mouth daily. 90 tablet 3   cetirizine  (ZYRTEC  ALLERGY ) 10 MG tablet Take 1 tablet (10 mg total) by mouth daily. 90 tablet 1   diltiazem  (CARDIZEM  CD) 360 MG 24 hr capsule Take 1 capsule (360 mg total) by mouth daily. 90 capsule 3   empagliflozin  (JARDIANCE ) 25 MG TABS tablet Take 25 mg by mouth daily at 6 PM.     ferrous sulfate  325 (65 FE) MG tablet Take 325 mg by mouth daily with breakfast.     glipiZIDE  (GLUCOTROL  XL) 5 MG 24 hr tablet Take 1 tablet (5 mg total) by mouth daily with breakfast. 30 tablet 0   insulin  aspart (NOVOLOG ) 100 UNIT/ML FlexPen Inject 0-11 Units into the skin 3 (three) times daily with meals. Check Blood Glucose (BG) and inject per scale: BG <150= 0 unit; BG 150-200= 1 unit; BG 201-250= 3 unit; BG 251-300= 5 unit; BG 301-350= 7 unit; BG 351-400= 9 unit; BG >400= 11 unit and Call Primary Care. (Patient taking differently: Inject 0-11 Units into the skin as needed for high blood sugar. Check Blood Glucose (BG) and inject per scale: BG <150= 0 unit; BG 150-200= 1 unit; BG 201-250= 3 unit; BG 251-300= 5 unit; BG 301-350= 7 unit; BG 351-400= 9 unit; BG >400= 11 unit and Call Primary Care.) 15 mL 0   ipratropium-albuterol  (DUONEB) 0.5-2.5 (3) MG/3ML SOLN Take 3 mLs by nebulization  in the morning and at bedtime. 360 mL 2   lidocaine -prilocaine  (EMLA ) cream Apply to affected area once (Patient not taking: Reported on 09/12/2024) 30 g 3   metFORMIN  (GLUCOPHAGE ) 1000 MG tablet Take 1 tablet (1,000 mg total) by mouth 2 (two) times daily with a meal.     nystatin  (MYCOSTATIN ) 100000 UNIT/ML suspension Take 5 mLs (500,000 Units total) by mouth 4 (four) times daily. 60 mL 0   nystatin  (MYCOSTATIN /NYSTOP ) powder Apply 1 Application topically 3 (three) times daily. 30 g 0   ondansetron  (ZOFRAN ) 8 MG tablet Take 1 tablet (8 mg total) by mouth every 8 (eight) hours as needed for nausea or vomiting. Start on the third day after carboplatin . 30 tablet 1   polyethylene glycol (MIRALAX  /  GLYCOLAX ) 17 g packet Take 17 g packet by mouth daily. 14 each 0   prochlorperazine  (COMPAZINE ) 10 MG tablet Take 1 tablet (10 mg total) by mouth every 6 (six) hours as needed. (Patient taking differently: Take 10 mg by mouth every 6 (six) hours as needed for nausea or vomiting.) 30 tablet 2   senna-docusate (SENOKOT-S) 8.6-50 MG tablet Take 1 tablet by mouth 2 (two) times daily.     umeclidinium-vilanterol (ANORO ELLIPTA ) 62.5-25 MCG/ACT AEPB Inhale 1 puff into the lungs daily. 14 each 6   No current facility-administered medications for this visit.    VITAL SIGNS: BP 128/70   Pulse 93   Temp (!) 97.5 F (36.4 C)   Resp 18   Wt 136 lb 14.4 oz (62.1 kg)   SpO2 97%   BMI 23.50 kg/m  Filed Weights   10/02/24 1058  Weight: 136 lb 14.4 oz (62.1 kg)    Estimated body mass index is 23.5 kg/m as calculated from the following:   Height as of 09/24/24: 5' 4 (1.626 m).   Weight as of this encounter: 136 lb 14.4 oz (62.1 kg).  LABS: CBC:    Component Value Date/Time   WBC 8.6 10/02/2024 1035   WBC 4.9 09/13/2024 0541   HGB 8.8 (L) 10/02/2024 1035   HCT 28.6 (L) 10/02/2024 1035   PLT 530 (H) 10/02/2024 1035   MCV 80.1 10/02/2024 1035   NEUTROABS 6.4 10/02/2024 1035   LYMPHSABS 1.1 10/02/2024 1035   MONOABS 0.9 10/02/2024 1035   EOSABS 0.2 10/02/2024 1035   BASOSABS 0.1 10/02/2024 1035   Comprehensive Metabolic Panel:    Component Value Date/Time   NA 136 10/02/2024 1035   K 3.5 10/02/2024 1035   CL 102 10/02/2024 1035   CO2 26 10/02/2024 1035   BUN 6 (L) 10/02/2024 1035   CREATININE 0.39 (L) 10/02/2024 1035   GLUCOSE 170 (H) 10/02/2024 1035   CALCIUM  10.1 10/02/2024 1035   AST 9 (L) 10/02/2024 1035   ALT <5 10/02/2024 1035   ALKPHOS 71 10/02/2024 1035   BILITOT 0.2 10/02/2024 1035   PROT 7.3 10/02/2024 1035   ALBUMIN 3.1 (L) 10/02/2024 1035    RADIOGRAPHIC STUDIES: IR THORACENTESIS ASP PLEURAL SPACE W/IMG GUIDE Result Date: 09/25/2024 INDICATION: Large right  effusion Patient with history of lung cancer, dyspnea, recurrent right pleural effusion. Request received for therapeutic right thoracentesis. EXAM: ULTRASOUND GUIDED THERAPEUTIC RIGHT THORACENTESIS MEDICATIONS: 8 mL 1% lidocaine  with epinephrine  to skin and subcutaneous tissue COMPLICATIONS: None immediate. PROCEDURE: An ultrasound guided thoracentesis was thoroughly discussed with the patient and questions answered. The benefits, risks, alternatives and complications were also discussed. The patient understands and wishes to proceed with the procedure. Written consent was obtained.  Ultrasound was performed to localize and mark an adequate pocket of fluid in the RIGHT chest. The area was then prepped and draped in the normal sterile fashion. 1% Lidocaine  was used for local anesthesia. Under ultrasound guidance a 6 Fr Safe-T-Centesis catheter was introduced. Thoracentesis was performed. The catheter was removed and a dressing applied. FINDINGS: *Procedural chest ultrasound revealed small to moderate RIGHT pleural fluid. *A total of approximately 700 mL of slightly hazy, yellow fluid was removed. Due to patient chest discomfort only the above amount of fluid was aspirated today. IMPRESSION: Successful ultrasound guided therapeutic RIGHT thoracentesis yielding 700 mL of pleural fluid. Performed by: Franky Rakers, PA-C under direct supervision of Thom Hall, MD Electronically Signed   By: Thom Hall M.D.   On: 09/25/2024 12:38   DG Chest 1 View Result Date: 09/25/2024 CLINICAL DATA:  Post thoracentesis. EXAM: CHEST  1 VIEW COMPARISON:  09/24/2024 FINDINGS: Lungs are hypoinflated with continued evidence of a moderate right-sided pleural effusion likely with associated basilar atelectasis. No right-sided pneumothorax. Mild hazy interstitial prominence of the left mid to lower lung unchanged. Mild stable cardiomegaly. Remainder of the exam is unchanged. IMPRESSION: 1. Hypoinflation with continued moderate right-sided  pleural effusion likely with associated basilar atelectasis. No pneumothorax. 2. Stable mild hazy interstitial prominence of the left mid to lower lung. Electronically Signed   By: Toribio Agreste M.D.   On: 09/25/2024 12:32   DG Chest 2 View Result Date: 09/24/2024 EXAM: 2 VIEW(S) XRAY OF THE CHEST 09/24/2024 01:42:33 PM COMPARISON: 09/11/2024 CLINICAL HISTORY: Reasses pleural effusion. Reassess Pleural effusion - Past medical history of COPD, DM, CAD, PE, HTN, HLD, MDD, diverticulosis. Recent hospitalization for new pleural effusion. Patient to receive immunotherapy (keytruda ) only today. FINDINGS: LUNGS AND PLEURA: Large right pleural effusion with associated right basilar and mid lung airspace opacities. Diffuse interstitial prominence favoring interstitial pulmonary edema. Small left pleural effusion. HEART AND MEDIASTINUM: Right hilar fullness favoring adenopathy or mass. Atheromatous vascular calcification of the aortic arch. BONES AND SOFT TISSUES: Emphysema noted. Aortic atherosclerosis. IMPRESSION: 1. Large right pleural effusion with associated right basilar and mid lung airspace opacities. 2. Diffuse interstitial prominence favoring interstitial pulmonary edema. 3. Right hilar fullness favoring adenopathy or mass. 4. Small left pleural effusion. 5. Emphysema. 6. Aortic atherosclerosis. Electronically signed by: Ryan Salvage MD 09/24/2024 02:25 PM EDT RP Workstation: [id number]   DG Chest Port 1 View Result Date: 09/11/2024 CLINICAL DATA:  Status post right thoracentesis EXAM: PORTABLE CHEST 1 VIEW COMPARISON:  Chest radiograph dated 09/10/2024 FINDINGS: Low lung volumes. Similar diffuse interstitial and dense right basilar opacity. Decreased moderate right pleural effusion. No definite pneumothorax. Right heart border is obscured. No acute osseous abnormality. IMPRESSION: 1. Decreased moderate right pleural effusion. No definite pneumothorax. 2. Similar diffuse interstitial and dense right  basilar opacity. Electronically Signed   By: Limin  Xu M.D.   On: 09/11/2024 11:14   DG Chest Port 1 View Result Date: 09/10/2024 EXAM: 1 VIEW XRAY OF THE CHEST 09/10/2024 04:25:34 PM COMPARISON: 08/30/2024 CLINICAL HISTORY: Patient brought in by [facility] for weakness that she's been having since her chemo treatment on 09/04/24. FINDINGS: LUNGS AND PLEURA: Persistent right basilar airspace opacity. Persistent diffuse interstitial prominence. Moderate right pleural effusion with right basilar atelectasis. HEART AND MEDIASTINUM: Atherosclerotic plaque. BONES AND SOFT TISSUES: No acute osseous abnormality. IMPRESSION: 1. Moderate right pleural effusion with right basilar opacities, increased from prior. 2. Similar appearance of interstitial edema. Electronically signed by: Donnice Mania MD 09/10/2024 04:46 PM EDT RP Workstation:  [id number]    PERFORMANCE STATUS (ECOG) : 2 - Symptomatic, <50% confined to bed  Review of Systems  Constitutional:  Positive for activity change and fatigue.  Respiratory:  Positive for cough and shortness of breath.   Unless otherwise noted, a complete review of systems is negative.  Physical Exam General: NAD Cardiovascular: regular rate and rhythm Pulmonary: diminished bilaterally, oxygen in place @ 3L/ Abdomen: soft, nontender, + bowel sounds Extremities: no edema, no joint deformities Skin: no rashes Neurological: Alert and oriented x3  Discussed the use of AI scribe software for clinical note transcription with the patient, who gave verbal consent to proceed.  History of Present Illness Lindsey Williamson is a 73 year old female with recurrent metastatic lung cancer who presents for her initial outpatient palliative visit. Patient was initially seen by our team during recent hospitalization. No acute distress. Her granddaughter accompanied her today however not present during discussions. Patient is alert and able to engage appropriately in discussions.   I  introduced myself, Maygan RN, and Palliative's role in collaboration with the oncology team. Concept of Palliative Care was introduced as specialized medical care for people and their families living with serious illness.  It focuses on providing relief from the symptoms and stress of a serious illness.  The goal is to improve quality of life for both the patient and the family. Values and goals of care important to patient and family were attempted to be elicited.  Lindsey Williamson lives in the home with her husband of more than 40 years. They have  five children and 19 grand and great grandchildren, with some family members living locally and others in [place] .  Patient recently discharged this past Friday from [facility]. States she has been doing well since returning home. She is on 3 liters of oxygen. She plans to see her pulmonologist on the 10th. She is receiving physical and occupational therapy at home.  She has experienced significant weight loss, from 142 pounds to 136 pounds. Her appetite fluctuates but also notes that food was not the best at rehab.  States she has a follow-up with her PCP due to confusion and changes in her medication regimen since being at rehab.   Lindsey Williamson states she currently lives on the second floor and finds the stairs challenging, but is learning to manage her breathing and uses a walker. She wants to move to a more convenient location on first floor in the near future.   Lindsey Williamson shares she is in need of some medical equipment to allow her care in the home to improve which would include a bedside commode, hospital bed, and wheelchair. I offered to order these items however patient states she would like to discuss further with her PCP and will contact office as needed.   Patient endorses back and chest wall pain. Pain is controlled with Tramadol 50mg  which she reports only taking at bedtime. Some days are better than others requiring daytime dose but this  does not happen often as she tries to manage with Tylenol . She is also taking Compazine  for nausea as needed.   Goals of Care We discussed Lindsey Williamson's current illness and what it means in the larger context of her on-going co-morbidities. Natural disease trajectory and expectations were discussed.  Patient is realistic in her understanding of incurable cancer and all treatment being palliative focused. Patient shares she does not have a fear of facing end-of-life speaking to her strong [place] faith.  She has a history of depression and is receiving counseling. She goes to church and finds strength in her faith. She is determined to maintain her quality of life and is focused on living in the moment.  Lindsey Williamson is clear in her expressed wishes to continue to treat the treatable allowing her every opportunity to continue thriving while aggressively managing her symptoms to prevent any suffering.  I empathetically approach discussions regarding goals of care, advanced directives, and healthcare limitations.  Patient states she does not have a documented advanced directive however her husband, Lindsey Williamson is very much aware of her wishes.  States he would be her medical decision-maker in the event she is unable to speak for herself.  Her family supports her wishes and has a clear understanding of her overall prognosis.  Patient reports she would not wish for her life to be sustain by artificial feedings and tubes if no improvement or quality of life to be recovered.  I discussed the importance of continued conversation with family and their medical providers regarding overall plan of care and treatment options, ensuring decisions are within the context of the patients values and GOCs.   Assessment & Plan Established therapeutic relationship. Education provided on palliative's role in collaboration with their Oncology/Radiation team.  Recurrent metastatic lung cancer Undergoing active  treatment with a strong will to fight the disease and maintain a positive outlook.  Patient is realistic that her cancer is incurable.  Fatigue Improving since returning home from rehab.  Patient states she is actively working with PT/OT in the home. -Patient expressed need for hospital bed, wheelchair, bedside commode however would like to discuss with her PCP later this week and will contact office if assistance is needed with ordering.  Chronic pain due to cancer Managed with tramadol 50 mg at night, causing drowsiness at times. - Refill tramadol as needed, call when down to 2-3 days' supply  Chronic respiratory failure Requires 3 liters of supplemental oxygen, recently discharged from rehab, managing condition at home. - Discuss oxygen needs with pulmonologist on October 10th  Appetite loss and weight loss Decreased appetite and weight loss, currently 136 lbs, down from 142 lbs. Patient attributes some weight loss to her recent stay at rehabilitation.  We will plan to continue to closely monitor nutritional status.  Nausea Occasional nausea managed with Compazine  10 mg. - Use Compazine  10 mg as needed for nausea  Constipation Managed with stool softener and Miralax , likely related to iron supplements and pain medication. - Continue stool softener and Miralax  as needed  Depression Receiving counseling with a positive outlook and strong support system from family and faith community.  Goals of Care Desires aggressive treatment expressing clear wishes to continue to treat the treatable allow her every opportunity to continue to thrive.  She is realistic in her understanding of incurable cancer and prognosis. - Patient has not completed a healthcare power of attorney or living will.  States her husband Lindsey Williamson would be her medical decision-maker in the event she is unable to speak for herself - Would not want her life to be prolonged by artificial feedings or life-sustaining  measures if no evidence of recovery or improvement in her quality of life.  I will plan to see her back in 3-4 weeks.  Sooner if needed.  Patient expressed understanding and was in agreement with this plan. She also understands that She can call the clinic at any time with any questions, concerns, or complaints.   Thank you  for your referral and allowing Palliative to assist in Lindsey Williamson's care.   Number and complexity of problems addressed: HIGH - 1 or more chronic illnesses with SEVERE exacerbation, progression, or side effects of treatment - advanced cancer, pain. Any controlled substances utilized were prescribed in the context of palliative care.  Visit consisted of counseling and education dealing with the complex and emotionally intense issues of symptom management and palliative care in the setting of serious and potentially life-threatening illness.  Signed by: Levon Borer, AGPCNP-BC Palliative Medicine Team/[facility]

## 2024-10-03 ENCOUNTER — Inpatient Hospital Stay

## 2024-10-03 ENCOUNTER — Ambulatory Visit

## 2024-10-05 ENCOUNTER — Ambulatory Visit (INDEPENDENT_AMBULATORY_CARE_PROVIDER_SITE_OTHER): Admitting: Pulmonary Disease

## 2024-10-05 ENCOUNTER — Encounter: Payer: Self-pay | Admitting: Pulmonary Disease

## 2024-10-05 ENCOUNTER — Ambulatory Visit (INDEPENDENT_AMBULATORY_CARE_PROVIDER_SITE_OTHER)

## 2024-10-05 VITALS — BP 130/76 | HR 88 | Temp 98.4°F | Ht 64.0 in | Wt 137.0 lb

## 2024-10-05 DIAGNOSIS — R0602 Shortness of breath: Secondary | ICD-10-CM

## 2024-10-05 MED ORDER — GUAIFENESIN-DM 100-10 MG/5ML PO SYRP: 5.0000 mL | ORAL_SOLUTION | ORAL | 0 refills | Status: DC | PRN

## 2024-10-05 NOTE — Patient Instructions (Signed)
 I will see you back in about 3 months  We will get a chest x-ray today  If you are feeling more short of breath than usual, just let us  know we will make arrangements to have fluid drained  Continue your current treatments  Continue your current inhalers  Continue oxygen use around-the-clock  Call us  with significant concerns

## 2024-10-05 NOTE — Progress Notes (Signed)
 Lindsey Williamson    [id number]    1951/07/05  Primary Care Physician:Briscoe, Luke POUR, MD  Referring Physician: [name], MD 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 Had presented with recurrent tumor at the right base associated with lower lobe metastasis and right hilar and subcarinal nodal metastasis diagnosed in October 2024  History of radiation to right upper lobe, chemoradiation Immunotherapy with Imfinzi   Has had recent hospitalization Did have large pleural fluid collection for which she has had multiple thoracentesis currently on Palliative systemic chemotherapy including carboplatin  paclitaxel  Keytruda  with Neulasta  support Recently she states that she is only on Keytruda   Previously had hemoptysis that required hospitalization as well  Previously treated for medication associated pneumonitis  She has underlying interstitial lung disease, chronic obstructive pulmonary disease, coronary artery disease, hypertension, diabetes  She feels fair Gets tired easily Gets short of breath with activity  She uses oxygen around-the-clock  Continues to follow-up with oncology  Outpatient Encounter Medications as of 10/05/2024  Medication Sig   albuterol  (VENTOLIN  HFA) 108 (90 Base) MCG/ACT inhaler Inhale 2 puffs into the lungs every 6 (six) hours as needed for wheezing or shortness of breath.   aspirin  EC 81 MG tablet Take 81 mg by mouth daily. Swallow whole.   bisoprolol  (ZEBETA ) 10 MG tablet Take 1 tablet (10 mg total) by mouth daily.   cetirizine  (ZYRTEC  ALLERGY ) 10 MG tablet Take 1 tablet (10 mg total) by mouth daily.   diltiazem  (CARDIZEM  CD) 360 MG 24 hr capsule Take 1 capsule (360 mg total) by  mouth daily.   empagliflozin  (JARDIANCE ) 25 MG TABS tablet Take 25 mg by mouth daily at 6 PM.   ferrous sulfate  325 (65 FE) MG tablet Take 325 mg by mouth daily with breakfast.   glipiZIDE  (GLUCOTROL  XL) 5 MG 24 hr tablet Take 1 tablet (5 mg total) by mouth daily with breakfast.   insulin  aspart (NOVOLOG ) 100 UNIT/ML FlexPen Inject 0-11 Units into the skin 3 (three) times daily with meals. Check Blood Glucose (BG) and inject per scale: BG <150= 0 unit; BG 150-200= 1 unit; BG 201-250= 3 unit; BG 251-300= 5 unit; BG 301-350= 7 unit; BG 351-400= 9 unit; BG >400= 11 unit and Call Primary Care. (Patient taking differently: Inject 0-11 Units into the skin as needed for high blood sugar. Check Blood Glucose (BG) and inject per scale: BG <150= 0 unit; BG 150-200= 1 unit; BG 201-250= 3 unit; BG 251-300= 5 unit; BG 301-350= 7 unit; BG 351-400= 9 unit; BG >400= 11 unit and Call Primary Care.)   ipratropium-albuterol  (DUONEB) 0.5-2.5 (3) MG/3ML SOLN Take 3 mLs by nebulization in the morning and at bedtime.   lidocaine -prilocaine  (EMLA ) cream Apply to affected area once   metFORMIN  (GLUCOPHAGE ) 1000 MG tablet Take 1 tablet (1,000 mg total) by mouth 2 (two) times daily with a meal.   nystatin  (MYCOSTATIN ) 100000 UNIT/ML suspension Take 5 mLs (500,000 Units total) by mouth 4 (four) times daily.   nystatin  (MYCOSTATIN /NYSTOP ) powder Apply 1 Application topically 3 (three) times daily.   ondansetron  (ZOFRAN ) 8 MG tablet Take 1 tablet (8 mg total) by mouth every 8 (eight) hours as needed for nausea or vomiting. Start on  the third day after carboplatin .   polyethylene glycol (MIRALAX  / GLYCOLAX ) 17 g packet Take 17 g packet by mouth daily.   prochlorperazine  (COMPAZINE ) 10 MG tablet Take 1 tablet (10 mg total) by mouth every 6 (six) hours as needed. (Patient taking differently: Take 10 mg by mouth every 6 (six) hours as needed for nausea or vomiting.)   senna-docusate (SENOKOT-S) 8.6-50 MG tablet Take 1 tablet by mouth 2  (two) times daily.   umeclidinium-vilanterol (ANORO ELLIPTA ) 62.5-25 MCG/ACT AEPB Inhale 1 puff into the lungs daily.   No facility-administered encounter medications on file as of 10/05/2024.    Allergies as of 10/05/2024 - Review Complete 10/05/2024  Allergen Reaction Noted   Benzonatate Palpitations 12/13/2022   Lisinopril Swelling and Other (See Comments) 10/17/2023   Aspirin  Nausea And Vomiting, Palpitations, and Other (See Comments) 12/12/2013   Azithromycin  Palpitations 03/14/2015   Codeine  Palpitations and Other (See Comments) 03/14/2015    Past Medical History:  Diagnosis Date   Angioedema 10/17/2023   Arthritis    Diabetes mellitus without complication (HCC)    Hypertension     Past Surgical History:  Procedure Laterality Date   BRONCHIAL BIOPSY  03/30/2022   Procedure: BRONCHIAL BIOPSIES;  Surgeon: Brenna Adine CROME, DO;  Location: [facility] ENDOSCOPY;  Service: Pulmonary;;   BRONCHIAL BIOPSY  10/17/2023   Procedure: BRONCHIAL BIOPSIES;  Surgeon: Shelah Lamar RAMAN, MD;  Location: [facility] ENDOSCOPY;  Service: Pulmonary;;   BRONCHIAL BRUSHINGS  03/30/2022   Procedure: BRONCHIAL BRUSHINGS;  Surgeon: Brenna Adine CROME, DO;  Location: [facility] ENDOSCOPY;  Service: Pulmonary;;   BRONCHIAL BRUSHINGS  10/17/2023   Procedure: BRONCHIAL BRUSHINGS;  Surgeon: Shelah Lamar RAMAN, MD;  Location: [facility] ENDOSCOPY;  Service: Pulmonary;;   BRONCHIAL NEEDLE ASPIRATION BIOPSY  03/30/2022   Procedure: BRONCHIAL NEEDLE ASPIRATION BIOPSIES;  Surgeon: Brenna Adine CROME, DO;  Location: [facility] ENDOSCOPY;  Service: Pulmonary;;   BRONCHIAL NEEDLE ASPIRATION BIOPSY  10/17/2023   Procedure: BRONCHIAL NEEDLE ASPIRATION BIOPSIES;  Surgeon: Shelah Lamar RAMAN, MD;  Location: [facility] ENDOSCOPY;  Service: Pulmonary;;   BRONCHIAL WASHINGS  05/19/2024   Procedure: IRRIGATION, BRONCHUS;  Surgeon: Jude Harden GAILS, MD;  Location: [facility] ENDOSCOPY;  Service: Cardiopulmonary;;   EYE SURGERY     FIDUCIAL MARKER PLACEMENT  03/30/2022   Procedure: FIDUCIAL MARKER  PLACEMENT;  Surgeon: Brenna Adine CROME, DO;  Location: [facility] ENDOSCOPY;  Service: Pulmonary;;   FINE NEEDLE ASPIRATION  10/17/2023   Procedure: FINE NEEDLE ASPIRATION;  Surgeon: Shelah Lamar RAMAN, MD;  Location: [facility] ENDOSCOPY;  Service: Pulmonary;;   FOREIGN BODY REMOVAL  03/30/2022   Procedure: FOREIGN BODY REMOVAL;  Surgeon: Brenna Adine CROME, DO;  Location: [facility] ENDOSCOPY;  Service: Pulmonary;;   HEMOSTASIS CONTROL  10/17/2023   Procedure: HEMOSTASIS CONTROL;  Surgeon: Shelah Lamar RAMAN, MD;  Location: [facility] ENDOSCOPY;  Service: Pulmonary;;   IR THORACENTESIS ASP PLEURAL SPACE W/IMG GUIDE  08/21/2024   IR THORACENTESIS ASP PLEURAL SPACE W/IMG GUIDE  08/30/2024   IR THORACENTESIS ASP PLEURAL SPACE W/IMG GUIDE  09/25/2024   VIDEO BRONCHOSCOPY Bilateral 05/19/2024   Procedure: VIDEO BRONCHOSCOPY WITHOUT FLUORO;  Surgeon: Jude Harden GAILS, MD;  Location: [facility] ENDOSCOPY;  Service: Cardiopulmonary;  Laterality: Bilateral;   VIDEO BRONCHOSCOPY WITH ENDOBRONCHIAL ULTRASOUND Bilateral 03/30/2022   Procedure: VIDEO BRONCHOSCOPY WITH ENDOBRONCHIAL ULTRASOUND;  Surgeon: Brenna Adine CROME, DO;  Location: [facility] ENDOSCOPY;  Service: Pulmonary;  Laterality: Bilateral;   VIDEO BRONCHOSCOPY WITH ENDOBRONCHIAL ULTRASOUND Right 10/17/2023   Procedure: VIDEO BRONCHOSCOPY WITH ENDOBRONCHIAL ULTRASOUND;  Surgeon: Shelah Lamar RAMAN,  MD;  Location: [facility] ENDOSCOPY;  Service: Pulmonary;  Laterality: Right;   VIDEO BRONCHOSCOPY WITH RADIAL ENDOBRONCHIAL ULTRASOUND  03/30/2022   Procedure: VIDEO BRONCHOSCOPY WITH RADIAL ENDOBRONCHIAL ULTRASOUND;  Surgeon: Brenna Adine CROME, DO;  Location: [facility] ENDOSCOPY;  Service: Pulmonary;;    Family History  Problem Relation Age of Onset   Allergic rhinitis Sister    Asthma Sister    Angioedema Neg Hx    Atopy Neg Hx    Immunodeficiency Neg Hx    Urticaria Neg Hx    Eczema Neg Hx     Social History   Socioeconomic History   Marital status: Married    Spouse name: Not on file   Number of children: Not on file    Years of education: Not on file   Highest education level: Not on file  Occupational History   Not on file  Tobacco Use   Smoking status: Former    Current packs/day: 0.00    Types: Cigarettes    Quit date: 2024    Years since quitting: 1.7    Passive exposure: Past   Smokeless tobacco: Never  Vaping Use   Vaping status: Never Used  Substance and Sexual Activity   Alcohol use: No   Drug use: Never   Sexual activity: Not Currently  Other Topics Concern   Not on file  Social History Narrative   Not on file   Social Drivers of Health   Financial Resource Strain: Low Risk  (04/24/2024)   Received from [facility]   Overall Financial Resource Strain (CARDIA)    Difficulty of Paying Living Expenses: Not very hard  Food Insecurity: No Food Insecurity (09/10/2024)   Hunger Vital Sign    Worried About Running Out of Food in the Last Year: Never true    Ran Out of Food in the Last Year: Never true  Transportation Needs: No Transportation Needs (09/10/2024)   PRAPARE - Administrator, Civil Service (Medical): No    Lack of Transportation (Non-Medical): No  Physical Activity: Inactive (04/24/2024)   Received from [facility]   Exercise Vital Sign    On average, how many days per week do you engage in moderate to strenuous exercise (like a brisk walk)?: 0 days    On average, how many minutes do you engage in exercise at this level?: 20 min  Stress: Stress Concern Present (04/24/2024)   Received from [facility] of Occupational [facility] - Occupational Stress Questionnaire    Feeling of Stress : Rather much  Social Connections: Moderately Integrated (09/10/2024)   Social Connection and Isolation Panel    Frequency of Communication with Friends and Family: More than three times a week    Frequency of Social Gatherings with Friends and Family: Never    Attends Religious Services: Never    Database administrator or Organizations: Yes    Attends Occupational hygienist Meetings: Never    Marital Status: Married  Catering manager Violence: Not At Risk (09/10/2024)   Humiliation, Afraid, Rape, and Kick questionnaire    Fear of Current or Ex-Partner: No    Emotionally Abused: No    Physically Abused: No    Sexually Abused: No    Review of Systems  Constitutional:  Positive for fatigue.  Respiratory:  Positive for cough and shortness of breath.     Vitals:   10/05/24 1031  BP: 130/76  Pulse: 88  Temp: 98.4 F (36.9 C)  SpO2:  95%     Physical Exam Constitutional:      Appearance: Normal appearance.  HENT:     Head: Normocephalic and atraumatic.     Mouth/Throat:     Mouth: Mucous membranes are moist.  Eyes:     General: No scleral icterus. Cardiovascular:     Rate and Rhythm: Normal rate and regular rhythm.     Heart sounds: No murmur heard.    No friction rub.  Pulmonary:     Effort: No respiratory distress.     Breath sounds: No stridor. No wheezing or rhonchi.  Musculoskeletal:     Cervical back: No rigidity or tenderness.  Neurological:     Mental Status: She is alert.  Psychiatric:        Mood and Affect: Mood normal.    Data Reviewed: Most recent CT scan 05/14/2024 reviewed showing extensive groundglass changes, emphysema adenopathy, traction bronchiectasis with peripheral honeycombing  Chest x-ray today reviewed showing right pleural effusion X-ray is not significantly changed compared to the last 1 postthoracentesis on 09/25/2024  Recent discharge summary from 09/14/2024 reviewed  Assessment:  History of non-small cell lung cancer of the right upper lobe with history of radiation treatment Recurrent cancer for which she is currently on Keytruda   Malignant pleural effusion - Has had multiple thoracentesis recently - Discussions have been had about having a PleurX catheter placed, patient feels she will not be able to manage the PleurX catheter  Shortness of breath - Multifactorial  Obstructive lung  disease - On Anoro  Chronic respiratory failure On oxygen supplementation around-the-clock   Plan/Recommendations: Continue bronchodilator treatments  Continue current inhalers  Continue oxygen supplementation around-the-clock  Cough medicine was called in Robitussin with codeine   Follow-up in about 3 months  Encouraged to call with significant concerns  We can make arrangements for intermittent thoracentesis depending on symptoms  Encouraged to give us  a call with significant concerns    Jennet Epley MD [facility] 10/05/2024, 10:41 AM  CC: [name], MD

## 2024-10-08 ENCOUNTER — Ambulatory Visit (HOSPITAL_COMMUNITY)
Admission: RE | Admit: 2024-10-08 | Discharge: 2024-10-08 | Disposition: A | Discharge status: Home / Self Care | Source: Ambulatory Visit | Attending: Physician Assistant | Admitting: Physician Assistant

## 2024-10-08 ENCOUNTER — Inpatient Hospital Stay

## 2024-10-08 DIAGNOSIS — C3431 Malignant neoplasm of lower lobe, right bronchus or lung: Secondary | ICD-10-CM | POA: Insufficient documentation

## 2024-10-08 MED ORDER — IOHEXOL 300 MG/ML  SOLN
100.0000 mL | Freq: Once | INTRAMUSCULAR | Status: AC | PRN
  Administered 2024-10-08: 100 mL via INTRAVENOUS

## 2024-10-08 MED ORDER — SODIUM CHLORIDE (PF) 0.9 % IJ SOLN
INTRAMUSCULAR | Status: AC
  Filled 2024-10-08: qty 50

## 2024-10-09 ENCOUNTER — Encounter: Payer: Self-pay | Admitting: Pulmonary Disease

## 2024-10-10 ENCOUNTER — Other Ambulatory Visit

## 2024-10-10 ENCOUNTER — Ambulatory Visit

## 2024-10-10 ENCOUNTER — Ambulatory Visit: Admitting: Internal Medicine

## 2024-10-10 ENCOUNTER — Inpatient Hospital Stay

## 2024-10-11 ENCOUNTER — Inpatient Hospital Stay: Admitting: Licensed Clinical Social Worker

## 2024-10-11 DIAGNOSIS — C3431 Malignant neoplasm of lower lobe, right bronchus or lung: Secondary | ICD-10-CM

## 2024-10-11 NOTE — Progress Notes (Signed)
 [facility] CSW Progress Note  Clinical Child psychotherapist contacted patient by phone to follow-up on emotional support.    Interventions: CSW spoke with pt to check in after discharge from rehab.  Pt reports she was able to regain some strength while in rehab and continues to work with PT/OT outpt.  Pt is realistic in her expectations, stating she would like to continue treatment for as long as she has a quality of life that allows her to spend meaningful time with her family and is able to continue to do most things for herself.  Pt has acquired a wheelchair to use for long distances.  Emotional support provide.        Follow Up Plan:  Patient will contact CSW with any support or resource needs    Devere JONELLE Manna, LCSW Clinical Social Worker [facility]

## 2024-10-12 ENCOUNTER — Other Ambulatory Visit: Payer: Self-pay | Admitting: Pulmonary Disease

## 2024-10-12 ENCOUNTER — Ambulatory Visit

## 2024-10-12 MED ORDER — HYDROCODONE BIT-HOMATROP MBR 5-1.5 MG/5ML PO SOLN: 5.0000 mL | Freq: Four times a day (QID) | ORAL | 0 refills | Status: DC | PRN

## 2024-10-15 ENCOUNTER — Inpatient Hospital Stay

## 2024-10-15 ENCOUNTER — Inpatient Hospital Stay (HOSPITAL_BASED_OUTPATIENT_CLINIC_OR_DEPARTMENT_OTHER): Admitting: Internal Medicine

## 2024-10-15 VITALS — BP 113/72 | HR 80 | Temp 98.0°F | Resp 17 | Ht 64.0 in | Wt 136.5 lb

## 2024-10-15 DIAGNOSIS — C3431 Malignant neoplasm of lower lobe, right bronchus or lung: Secondary | ICD-10-CM

## 2024-10-15 DIAGNOSIS — D649 Anemia, unspecified: Secondary | ICD-10-CM

## 2024-10-15 DIAGNOSIS — Z5111 Encounter for antineoplastic chemotherapy: Secondary | ICD-10-CM | POA: Diagnosis not present

## 2024-10-15 LAB — CMP (CANCER CENTER ONLY)
ALT: <5 U/L (ref 0–44)
AST: <10 U/L — ABNORMAL LOW (ref 15–41)
Albumin: 3.3 g/dL — ABNORMAL LOW (ref 3.5–5.0)
Alkaline Phosphatase: 70 U/L (ref 38–126)
Anion gap: 11 (ref 5–15)
BUN: 9 mg/dL (ref 8–23)
CO2: 24 mmol/L (ref 22–32)
Calcium: 10.9 mg/dL — ABNORMAL HIGH (ref 8.9–10.3)
Chloride: 99 mmol/L (ref 98–111)
Creatinine: 0.44 mg/dL (ref 0.44–1.00)
GFR, Estimated: >60 mL/min (ref >60)
Glucose, Bld: 186 mg/dL — ABNORMAL HIGH (ref 70–99)
Potassium: 3.5 mmol/L (ref 3.5–5.1)
Sodium: 134 mmol/L — ABNORMAL LOW (ref 135–145)
Total Bilirubin: 0.3 mg/dL (ref 0.0–1.2)
Total Protein: 7.6 g/dL (ref 6.5–8.1)

## 2024-10-15 LAB — CBC WITH DIFFERENTIAL (CANCER CENTER ONLY)
Abs Immature Granulocytes: 0.04 K/uL (ref 0.00–0.07)
Basophils Absolute: 0.1 K/uL (ref 0.0–0.1)
Basophils Relative: 1 %
Eosinophils Absolute: 0.1 K/uL (ref 0.0–0.5)
Eosinophils Relative: 1 %
HCT: 30.5 % — ABNORMAL LOW (ref 36.0–46.0)
Hemoglobin: 9.3 g/dL — ABNORMAL LOW (ref 12.0–15.0)
Immature Granulocytes: 0 %
Lymphocytes Relative: 11 %
Lymphs Abs: 1.2 K/uL (ref 0.7–4.0)
MCH: 24.6 pg — ABNORMAL LOW (ref 26.0–34.0)
MCHC: 30.5 g/dL (ref 30.0–36.0)
MCV: 80.7 fL (ref 80.0–100.0)
Monocytes Absolute: 0.9 K/uL (ref 0.1–1.0)
Monocytes Relative: 9 %
Neutro Abs: 8.7 K/uL — ABNORMAL HIGH (ref 1.7–7.7)
Neutrophils Relative %: 78 %
Platelet Count: 543 K/uL — ABNORMAL HIGH (ref 150–400)
RBC: 3.78 MIL/uL — ABNORMAL LOW (ref 3.87–5.11)
RDW: 18.2 % — ABNORMAL HIGH (ref 11.5–15.5)
WBC Count: 11.1 K/uL — ABNORMAL HIGH (ref 4.0–10.5)
nRBC: 0 % (ref 0.0–0.2)

## 2024-10-15 LAB — SAMPLE TO BLOOD BANK

## 2024-10-15 NOTE — Progress Notes (Signed)
 [facility] [phone]   [phone]  OFFICE PROGRESS NOTE  [name], MD [address] [place] [address]  DIAGNOSIS:  1) history of stage Ia non-small cell lung cancer, adenocarcinoma the right upper lobe who under went radiation under the care of Dr. Patrcia from 04/23/2022-04/29/22 2) Recurrent lung cancer, initially diagnosed as Stage IIIa confirm (T3, N2, M0) non-small cell lung cancer, squamous cell carcinoma. She presented with recurrent tumor at the right lung base, associated right lower lobe metastasis, and right hilar and subcarinal nodal metastases. This was diagnosed in October 2024.   PDL1: 85%    PRIOR THERAPY: 1) Radiation to the right upper lobe under the care of Dr. Patrcia from 04/23/2022-04/29/22  2) Concurrent chemoradiation with carboplatin  for an AUC of 2 and paclitaxel  45 mg/m.  Last dose on 12/20/23 Status post 7 cycle.  3) Consolidation immunotherapy with Imfinzi  1500 mg IV every 4 weeks, first dose expected on 02/07/2024. Discontinued after 1 cycle due to concern for pneumonitis. 4) Palliative systemic chemotherapy with carboplatin  for an AUC of 5, paclitaxel  175 mg/m, Keytruda  200 mg IV every 3 weeks first dose on 07/23/2024.  She will also receive Neulasta  support.  Her dose of chemotherapy will be reduced to AUC of 4 and 150 mg/m starting from cycle #2.  She received a total of 3 cycles of treatment including the last cycle with single agent Keytruda .  Last dose was given on 09/24/2024 discontinued secondary to disease progression   CURRENT THERAPY: Gemcitabine 800 mg/M2 on days 1 and 8 every 3 weeks.  First dose October 22, 2024  INTERVAL HISTORY: AHAVA KISSOON 73 y.o. female returns to the clinic today for follow-up visit.Discussed the use of AI scribe software for clinical note transcription with the patient, who gave verbal consent to proceed.  History of Present Illness MARIEME MCMACKIN  is a 73 year old female with recurrent non-small cell lung cancer who presents for evaluation with repeat CT scan for restaging of her disease. She is accompanied by one of her sons from [place] .  She has a history of recurrent non-small cell lung cancer, initially diagnosed as stage IIIA in October 2024. She underwent concurrent chemoradiation followed by one cycle of consolidation immunotherapy, which was discontinued due to disease progression. She then started palliative systemic chemotherapy with carboplatin , paclitaxel , and Keytruda , but this was discontinued after two cycles due to adverse effects and frequent hospitalizations. She received one more cycle of single-agent Keytruda .  She experiences persistent coughing and right-sided chest pain, describing the pain as sometimes radiating from the right side. She also has difficulty breathing and requires continuous oxygen therapy at three liters. She reports that she still has fluid, and her family member also mentioned fluid around her right lung.  She is currently in physical therapy and reports feeling weak, though she states she has been eating well and is able to get around. She has experienced weight loss from the 140s to 136 pounds. She also mentions increased daytime sleepiness and being awake at night.  Her past medical history includes COPD and emphysema, attributed to a long history of smoking.    MEDICAL HISTORY: Past Medical History:  Diagnosis Date   Angioedema 10/17/2023   Arthritis    Diabetes mellitus without complication (HCC)    Hypertension     ALLERGIES:  is allergic to benzonatate, lisinopril, aspirin , azithromycin , and codeine .  MEDICATIONS:  Current Outpatient Medications  Medication Sig Dispense Refill   HYDROcodone  bit-homatropine (HYDROMET) 5-1.5 MG/5ML syrup Take 5 mLs by mouth every 6 (six) hours as needed for cough. 240 mL 0   albuterol  (VENTOLIN  HFA) 108 (90 Base) MCG/ACT inhaler Inhale 2 puffs into  the lungs every 6 (six) hours as needed for wheezing or shortness of breath. 20.1 g 2   aspirin  EC 81 MG tablet Take 81 mg by mouth daily. Swallow whole.     bisoprolol  (ZEBETA ) 10 MG tablet Take 1 tablet (10 mg total) by mouth daily. 90 tablet 3   cetirizine  (ZYRTEC  ALLERGY ) 10 MG tablet Take 1 tablet (10 mg total) by mouth daily. 90 tablet 1   diltiazem  (CARDIZEM  CD) 360 MG 24 hr capsule Take 1 capsule (360 mg total) by mouth daily. 90 capsule 3   empagliflozin  (JARDIANCE ) 25 MG TABS tablet Take 25 mg by mouth daily at 6 PM.     ferrous sulfate  325 (65 FE) MG tablet Take 325 mg by mouth daily with breakfast.     glipiZIDE  (GLUCOTROL  XL) 5 MG 24 hr tablet Take 1 tablet (5 mg total) by mouth daily with breakfast. 30 tablet 0   guaiFENesin -dextromethorphan  (ROBITUSSIN DM) 100-10 MG/5ML syrup Take 5 mLs by mouth every 4 (four) hours as needed for cough. 236 mL 0   insulin  aspart (NOVOLOG ) 100 UNIT/ML FlexPen Inject 0-11 Units into the skin 3 (three) times daily with meals. Check Blood Glucose (BG) and inject per scale: BG <150= 0 unit; BG 150-200= 1 unit; BG 201-250= 3 unit; BG 251-300= 5 unit; BG 301-350= 7 unit; BG 351-400= 9 unit; BG >400= 11 unit and Call Primary Care. (Patient taking differently: Inject 0-11 Units into the skin as needed for high blood sugar. Check Blood Glucose (BG) and inject per scale: BG <150= 0 unit; BG 150-200= 1 unit; BG 201-250= 3 unit; BG 251-300= 5 unit; BG 301-350= 7 unit; BG 351-400= 9 unit; BG >400= 11 unit and Call Primary Care.) 15 mL 0   ipratropium-albuterol  (DUONEB) 0.5-2.5 (3) MG/3ML SOLN Take 3 mLs by nebulization in the morning and at bedtime. 360 mL 2   lidocaine -prilocaine  (EMLA ) cream Apply to affected area once 30 g 3   metFORMIN  (GLUCOPHAGE ) 1000 MG tablet Take 1 tablet (1,000 mg total) by mouth 2 (two) times daily with a meal.     nystatin  (MYCOSTATIN ) 100000 UNIT/ML suspension Take 5 mLs (500,000 Units total) by mouth 4 (four) times daily. 60 mL 0    nystatin  (MYCOSTATIN /NYSTOP ) powder Apply 1 Application topically 3 (three) times daily. 30 g 0   ondansetron  (ZOFRAN ) 8 MG tablet Take 1 tablet (8 mg total) by mouth every 8 (eight) hours as needed for nausea or vomiting. Start on the third day after carboplatin . 30 tablet 1   polyethylene glycol (MIRALAX  / GLYCOLAX ) 17 g packet Take 17 g packet by mouth daily. 14 each 0   prochlorperazine  (COMPAZINE ) 10 MG tablet Take 1 tablet (10 mg total) by mouth every 6 (six) hours as needed. (Patient taking differently: Take 10 mg by mouth every 6 (six) hours as needed for nausea or vomiting.) 30 tablet 2   senna-docusate (SENOKOT-S) 8.6-50 MG tablet Take 1 tablet by mouth 2 (two) times daily.     umeclidinium-vilanterol (ANORO ELLIPTA ) 62.5-25 MCG/ACT AEPB Inhale 1 puff into the lungs daily. 14 each 6   No current facility-administered medications for this visit.    SURGICAL HISTORY:  Past Surgical History:  Procedure Laterality Date   BRONCHIAL BIOPSY  03/30/2022   Procedure: BRONCHIAL BIOPSIES;  Surgeon: Brenna Adine CROME, DO;  Location: [facility] ENDOSCOPY;  Service: Pulmonary;;   BRONCHIAL BIOPSY  10/17/2023   Procedure: BRONCHIAL BIOPSIES;  Surgeon: Shelah Lamar RAMAN, MD;  Location: [facility] ENDOSCOPY;  Service: Pulmonary;;   BRONCHIAL BRUSHINGS  03/30/2022   Procedure: BRONCHIAL BRUSHINGS;  Surgeon: Brenna Adine CROME, DO;  Location: [facility] ENDOSCOPY;  Service: Pulmonary;;   BRONCHIAL BRUSHINGS  10/17/2023   Procedure: BRONCHIAL BRUSHINGS;  Surgeon: Shelah Lamar RAMAN, MD;  Location: [facility] ENDOSCOPY;  Service: Pulmonary;;   BRONCHIAL NEEDLE ASPIRATION BIOPSY  03/30/2022   Procedure: BRONCHIAL NEEDLE ASPIRATION BIOPSIES;  Surgeon: Brenna Adine CROME, DO;  Location: [facility] ENDOSCOPY;  Service: Pulmonary;;   BRONCHIAL NEEDLE ASPIRATION BIOPSY  10/17/2023   Procedure: BRONCHIAL NEEDLE ASPIRATION BIOPSIES;  Surgeon: Shelah Lamar RAMAN, MD;  Location: [facility] ENDOSCOPY;  Service: Pulmonary;;   BRONCHIAL WASHINGS  05/19/2024   Procedure:  IRRIGATION, BRONCHUS;  Surgeon: Jude Harden GAILS, MD;  Location: [facility] ENDOSCOPY;  Service: Cardiopulmonary;;   EYE SURGERY     FIDUCIAL MARKER PLACEMENT  03/30/2022   Procedure: FIDUCIAL MARKER PLACEMENT;  Surgeon: Brenna Adine CROME, DO;  Location: [facility] ENDOSCOPY;  Service: Pulmonary;;   FINE NEEDLE ASPIRATION  10/17/2023   Procedure: FINE NEEDLE ASPIRATION;  Surgeon: Shelah Lamar RAMAN, MD;  Location: [facility] ENDOSCOPY;  Service: Pulmonary;;   FOREIGN BODY REMOVAL  03/30/2022   Procedure: FOREIGN BODY REMOVAL;  Surgeon: Brenna Adine CROME, DO;  Location: [facility] ENDOSCOPY;  Service: Pulmonary;;   HEMOSTASIS CONTROL  10/17/2023   Procedure: HEMOSTASIS CONTROL;  Surgeon: Shelah Lamar RAMAN, MD;  Location: [facility] ENDOSCOPY;  Service: Pulmonary;;   IR THORACENTESIS ASP PLEURAL SPACE W/IMG GUIDE  08/21/2024   IR THORACENTESIS ASP PLEURAL SPACE W/IMG GUIDE  08/30/2024   IR THORACENTESIS ASP PLEURAL SPACE W/IMG GUIDE  09/25/2024   VIDEO BRONCHOSCOPY Bilateral 05/19/2024   Procedure: VIDEO BRONCHOSCOPY WITHOUT FLUORO;  Surgeon: Jude Harden GAILS, MD;  Location: [facility] ENDOSCOPY;  Service: Cardiopulmonary;  Laterality: Bilateral;   VIDEO BRONCHOSCOPY WITH ENDOBRONCHIAL ULTRASOUND Bilateral 03/30/2022   Procedure: VIDEO BRONCHOSCOPY WITH ENDOBRONCHIAL ULTRASOUND;  Surgeon: Brenna Adine CROME, DO;  Location: [facility] ENDOSCOPY;  Service: Pulmonary;  Laterality: Bilateral;   VIDEO BRONCHOSCOPY WITH ENDOBRONCHIAL ULTRASOUND Right 10/17/2023   Procedure: VIDEO BRONCHOSCOPY WITH ENDOBRONCHIAL ULTRASOUND;  Surgeon: Shelah Lamar RAMAN, MD;  Location: [facility] ENDOSCOPY;  Service: Pulmonary;  Laterality: Right;   VIDEO BRONCHOSCOPY WITH RADIAL ENDOBRONCHIAL ULTRASOUND  03/30/2022   Procedure: VIDEO BRONCHOSCOPY WITH RADIAL ENDOBRONCHIAL ULTRASOUND;  Surgeon: Brenna Adine CROME, DO;  Location: [facility] ENDOSCOPY;  Service: Pulmonary;;    REVIEW OF SYSTEMS:  Constitutional: positive for fatigue Eyes: negative Ears, nose, mouth, throat, and face: negative Respiratory: positive for  cough, dyspnea on exertion, and pleurisy/chest pain Cardiovascular: negative Gastrointestinal: negative Genitourinary:negative Integument/breast: negative Hematologic/lymphatic: negative Musculoskeletal:positive for arthralgias and muscle weakness Neurological: negative Behavioral/Psych: negative Endocrine: negative Allergic/Immunologic: negative   PHYSICAL EXAMINATION: General appearance: alert, cooperative, fatigued, and no distress Head: Normocephalic, without obvious abnormality, atraumatic Neck: no adenopathy, no JVD, supple, symmetrical, trachea midline, and thyroid  not enlarged, symmetric, no tenderness/mass/nodules Lymph nodes: Cervical, supraclavicular, and axillary nodes normal. Resp: diminished breath sounds RLL and dullness to percussion RLL Back: symmetric, no curvature. ROM normal. No CVA tenderness. Cardio: regular rate and rhythm, S1, S2 normal, no murmur, click, rub or gallop GI: soft, non-tender; bowel sounds normal; no masses,  no organomegaly Extremities: extremities normal, atraumatic, no cyanosis or edema Neurologic: Alert and oriented X 3, normal strength and tone. Normal symmetric  reflexes. Normal coordination and gait  ECOG PERFORMANCE STATUS: 2 - Symptomatic, <50% confined to bed  There were no vitals taken for this visit.  LABORATORY DATA: Lab Results  Component Value Date   WBC 8.6 10/02/2024   HGB 8.8 (L) 10/02/2024   HCT 28.6 (L) 10/02/2024   MCV 80.1 10/02/2024   PLT 530 (H) 10/02/2024      Chemistry      Component Value Date/Time   NA 136 10/02/2024 1035   K 3.5 10/02/2024 1035   CL 102 10/02/2024 1035   CO2 26 10/02/2024 1035   BUN 6 (L) 10/02/2024 1035   CREATININE 0.39 (L) 10/02/2024 1035      Component Value Date/Time   CALCIUM  10.1 10/02/2024 1035   ALKPHOS 71 10/02/2024 1035   AST 9 (L) 10/02/2024 1035   ALT <5 10/02/2024 1035   BILITOT 0.2 10/02/2024 1035       RADIOGRAPHIC STUDIES: CT CHEST ABDOMEN PELVIS W  CONTRAST Result Date: 10/10/2024 CLINICAL DATA:  Right lower lobe cancer, follow-up. * Tracking Code: BO * EXAM: CT CHEST, ABDOMEN, AND PELVIS WITH CONTRAST TECHNIQUE: Multidetector CT imaging of the chest, abdomen and pelvis was performed following the standard protocol during bolus administration of intravenous contrast. RADIATION DOSE REDUCTION: This exam was performed according to the departmental dose-optimization program which includes automated exposure control, adjustment of the mA and/or kV according to patient size and/or use of iterative reconstruction technique. CONTRAST:  OMNIPAQUE  IOHEXOL  300 MG/ML  SOLN COMPARISON:  Multiple priors including PET-CT July 16, 2024 and CT chest June 28, 2024. FINDINGS: CT CHEST FINDINGS Cardiovascular: Aortic atherosclerosis. Borderline cardiac enlargement. Three-vessel coronary artery calcifications. Enlarged main pulmonary artery. Mediastinum/Nodes: No suspicious thyroid  nodule. Increased size of the mediastinal and right hilar metastatic lymph nodes. For reference: The precarinal lymph node measures 2.3 cm in short axis on image 23/2 previously 17 mm. Patulous esophagus. Lungs/Pleura: Necrotic mass in the right perihilar lung measures 5.1 x 4.3 cm on image 31/2 there is similar filling of the airways with downstream obstructive atelectasis. Similar right-greater-than-left nodular interstitial thickening. Similar moderate right pleural effusion. Subpleural left lower lobe mass measures 4.7 x 3.2 cm on image 92/9 previously 2.7 x 2.5 cm. Emphysema with bilateral subpleural reticulations. Musculoskeletal: Similar diffuse marrow heterogeneity which was not hypermetabolic on prior PET-CT. Thoracic spondylosis. CT ABDOMEN PELVIS FINDINGS Hepatobiliary: No suspicious hepatic lesion. Gallbladder is unremarkable. New prominence of the biliary tree with the common duct measuring 7 mm in diameter on image 54/2 . Pancreas: Pancreatic ductal dilation to the ampulla  measuring up to 8 mm with a questionable 7 mm hyperenhancing mass at the ampulla on image 62/7. Spleen: No splenomegaly. Adrenals/Urinary Tract: Stable 11 mm right adrenal nodule which was not hypermetabolic on prior PET-CT. No suspicious left adrenal nodule. No hydronephrosis. Kidneys demonstrate symmetric enhancement. Urinary bladder is unremarkable for degree of distension. Stomach/Bowel: No radiopaque enteric contrast material was administered. Stomach is unremarkable for degree of distension. No pathologic dilation of small or large bowel. Colonic diverticulosis without findings of acute diverticulitis. Vascular/Lymphatic: Aortic atherosclerosis. Normal caliber abdominal aorta. Smooth IVC contours. The portal, splenic and superior mesenteric veins are patent. No pathologically enlarged abdominal or pelvic lymph nodes. Reproductive: Uterine leiomyomas. Other: No significant abdominopelvic free fluid. Musculoskeletal: Similar heterogeneous marrow which was not hypermetabolic on prior PET-CT. Superior endplate compression deformity at L1 is unchanged. Lumbar spondylosis. Degenerative change of the bilateral hips. IMPRESSION: 1. Increased size of the necrotic right perihilar mass with similar filling  of the airways with downstream obstructive atelectasis. 2. Increased size of the subpleural left lower lobe mass. 3. Increased size of the mediastinal and hilar metastatic lymph nodes. 4. 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 5. Similar moderate right pleural effusion. 6. New prominence of the biliary tree with pancreatic ductal dilation to the ampulla measuring up to 8 mm and a questionable 7 mm hyperenhancing mass at the ampulla, suggest further evaluation with EUS/ERCP. 7. Similar diffuse marrow heterogeneity which was not hypermetabolic on prior PET-CT. Electronically Signed   By: Reyes Holder M.D.   On: 10/10/2024 16:19   DG Chest 2  View Result Date: 10/07/2024 CLINICAL DATA:  Shortness of breath EXAM: CHEST - 2 VIEW COMPARISON:  09/25/2024 FINDINGS: No significant change in chest radiographs, with gross cardiomegaly, a large right pleural effusion and diffuse bilateral interstitial opacity. No new or focal airspace opacity. No acute osseous findings. IMPRESSION: No significant change in chest radiographs, with gross cardiomegaly, a large right pleural effusion and diffuse bilateral interstitial opacity, consistent with edema or infection. No new or focal airspace opacity. Electronically Signed   By: Marolyn JONETTA Jaksch M.D.   On: 10/07/2024 12:11   IR THORACENTESIS ASP PLEURAL SPACE W/IMG GUIDE Result Date: 09/25/2024 INDICATION: Large right effusion Patient with history of lung cancer, dyspnea, recurrent right pleural effusion. Request received for therapeutic right thoracentesis. EXAM: ULTRASOUND GUIDED THERAPEUTIC RIGHT THORACENTESIS MEDICATIONS: 8 mL 1% lidocaine  with epinephrine  to skin and subcutaneous tissue COMPLICATIONS: None immediate. PROCEDURE: An ultrasound guided thoracentesis was thoroughly discussed with the patient and questions answered. The benefits, risks, alternatives and complications were also discussed. The patient understands and wishes to proceed with the procedure. Written consent was obtained. Ultrasound was performed to localize and mark an adequate pocket of fluid in the RIGHT chest. The area was then prepped and draped in the normal sterile fashion. 1% Lidocaine  was used for local anesthesia. Under ultrasound guidance a 6 Fr Safe-T-Centesis catheter was introduced. Thoracentesis was performed. The catheter was removed and a dressing applied. FINDINGS: *Procedural chest ultrasound revealed small to moderate RIGHT pleural fluid. *A total of approximately 700 mL of slightly hazy, yellow fluid was removed. Due to patient chest discomfort only the above amount of fluid was aspirated today. IMPRESSION: Successful  ultrasound guided therapeutic RIGHT thoracentesis yielding 700 mL of pleural fluid. Performed by: Franky Rakers, PA-C under direct supervision of Thom Hall, MD Electronically Signed   By: Thom Hall M.D.   On: 09/25/2024 12:38   DG Chest 1 View Result Date: 09/25/2024 CLINICAL DATA:  Post thoracentesis. EXAM: CHEST  1 VIEW COMPARISON:  09/24/2024 FINDINGS: Lungs are hypoinflated with continued evidence of a moderate right-sided pleural effusion likely with associated basilar atelectasis. No right-sided pneumothorax. Mild hazy interstitial prominence of the left mid to lower lung unchanged. Mild stable cardiomegaly. Remainder of the exam is unchanged. IMPRESSION: 1. Hypoinflation with continued moderate right-sided pleural effusion likely with associated basilar atelectasis. No pneumothorax. 2. Stable mild hazy interstitial prominence of the left mid to lower lung. Electronically Signed   By: Toribio Agreste M.D.   On: 09/25/2024 12:32   DG Chest 2 View Result Date: 09/24/2024 EXAM: 2 VIEW(S) XRAY OF THE CHEST 09/24/2024 01:42:33 PM COMPARISON: 09/11/2024 CLINICAL HISTORY: Reasses pleural effusion. Reassess Pleural effusion - Past medical history of COPD, DM, CAD, PE, HTN, HLD, MDD, diverticulosis. Recent hospitalization for new pleural effusion. Patient to receive immunotherapy (keytruda ) only today. FINDINGS: LUNGS AND PLEURA: Large right pleural effusion with  associated right basilar and mid lung airspace opacities. Diffuse interstitial prominence favoring interstitial pulmonary edema. Small left pleural effusion. HEART AND MEDIASTINUM: Right hilar fullness favoring adenopathy or mass. Atheromatous vascular calcification of the aortic arch. BONES AND SOFT TISSUES: Emphysema noted. Aortic atherosclerosis. IMPRESSION: 1. Large right pleural effusion with associated right basilar and mid lung airspace opacities. 2. Diffuse interstitial prominence favoring interstitial pulmonary edema. 3. Right hilar fullness  favoring adenopathy or mass. 4. Small left pleural effusion. 5. Emphysema. 6. Aortic atherosclerosis. Electronically signed by: Ryan Salvage MD 09/24/2024 02:25 PM EDT RP Workstation: [id number]    ASSESSMENT AND PLAN: This is a very pleasant 73 years old African-American female with recurrent non-small cell lung cancer that was initially diagnosed as a stage IIIa status post a course of concurrent chemoradiation followed by 1 cycle of consolidation treatment with immunotherapy discontinued secondary to suspicious pneumonitis. The patient had evidence for disease recurrence in October 2024 and started palliative systemic chemotherapy with carboplatin , paclitaxel  and Keytruda  status post 2 cycles. She had frequent hospitalization to the hospital after every cycle of her treatment with suspicious pneumonia.  She is currently on treatment with single agent Keytruda  every 3 weeks starting from cycle #3. Assessment and Plan Assessment & Plan Recurrent non-small cell lung cancer with progression and malignant right pleural effusion Recurrent non-small cell lung cancer initially diagnosed as stage IIIA in October 2024. Treated with chemoradiation and immunotherapy, but disease progressed. Current treatment with Keytruda  is not controlling the disease, as evidenced by CT scan showing progression and malignant right pleural effusion. Symptoms include cough, right-sided chest pain, and dyspnea. - Initiate gemcitabine chemotherapy on a schedule of two consecutive weeks followed by one week off. - Monitor for side effects including nausea, vomiting, and blood count changes. - Review CT scan images with her to explain disease progression. - Discuss potential future need for fluid drainage from pleural effusion.  Chronic obstructive pulmonary disease with emphysema COPD with emphysema contributing to respiratory symptoms. CT scan shows significant emphysema with black holes indicating lung damage. Right lung  affected by malignant pleural effusion and scarring from previous radiation. Breathing difficulties exacerbated by cancer progression and emphysema. - Continue current oxygen therapy at 3 liters per minute. - Monitor respiratory status and adjust treatment as needed. The patient was advised to call immediately if she has any other concerning symptoms in the interval.  The patient voices understanding of current disease status and treatment options and is in agreement with the current care plan.  All questions were answered. The patient knows to call the clinic with any problems, questions or concerns. We can certainly see the patient much sooner if necessary. The total time spent in the appointment was 55 minutes including review of chart and various tests results, discussions about plan of care and coordination of care plan .   Disclaimer: This note was dictated with voice recognition software. Similar sounding words can inadvertently be transcribed and may not be corrected upon review.

## 2024-10-15 NOTE — Progress Notes (Signed)
 DISCONTINUE ON PATHWAY REGIMEN - Non-Small Cell Lung     A cycle is every 21 days:     Pembrolizumab       Paclitaxel       Carboplatin    **Always confirm dose/schedule in your pharmacy ordering system**  PRIOR TREATMENT: [id number]: Pembrolizumab  200 mg + Carboplatin  AUC=6 + Paclitaxel  200 mg/m2 q21 Days x 4 Cycles  START OFF PATHWAY REGIMEN - Non-Small Cell Lung   [id number]:Gemcitabine 1,000 mg/m2 IV D1,8 q21 Days:   A cycle is every 21 days:     Gemcitabine   **Always confirm dose/schedule in your pharmacy ordering system**  Patient Characteristics: Stage IV Metastatic, Squamous, Molecular Analysis Not Elected, PS = 0, 1, Second Line - Chemotherapy/Immunotherapy, Prior PD-1/PD-L1 Inhibitor + Chemotherapy or No Prior PD-1/PD-L1 Inhibitor, and Not a Candidate for Immunotherapy Therapeutic Status: Stage IV Metastatic Histology: Squamous Cell ECOG Performance Status: 1 Chemotherapy/Immunotherapy Line of Therapy: Second Line Chemotherapy/Immunotherapy Immunotherapy Candidate Status: Not a Candidate for Immunotherapy Prior Immunotherapy Status: Prior PD-1/PD-L1 Inhibitor + Chemotherapy Intent of Therapy: Non-Curative / Palliative Intent, Discussed with Patient

## 2024-10-17 ENCOUNTER — Ambulatory Visit

## 2024-10-17 ENCOUNTER — Other Ambulatory Visit

## 2024-10-22 ENCOUNTER — Inpatient Hospital Stay

## 2024-10-23 ENCOUNTER — Other Ambulatory Visit: Payer: Self-pay | Admitting: Physician Assistant

## 2024-10-23 ENCOUNTER — Inpatient Hospital Stay

## 2024-10-23 ENCOUNTER — Other Ambulatory Visit: Payer: Self-pay

## 2024-10-23 ENCOUNTER — Encounter: Payer: Self-pay | Admitting: Internal Medicine

## 2024-10-23 VITALS — BP 149/86 | HR 93 | Temp 97.9°F | Resp 18 | Wt 136.5 lb

## 2024-10-23 DIAGNOSIS — D649 Anemia, unspecified: Secondary | ICD-10-CM

## 2024-10-23 DIAGNOSIS — C3431 Malignant neoplasm of lower lobe, right bronchus or lung: Secondary | ICD-10-CM

## 2024-10-23 DIAGNOSIS — Z515 Encounter for palliative care: Secondary | ICD-10-CM

## 2024-10-23 DIAGNOSIS — G893 Neoplasm related pain (acute) (chronic): Secondary | ICD-10-CM

## 2024-10-23 DIAGNOSIS — E876 Hypokalemia: Secondary | ICD-10-CM

## 2024-10-23 DIAGNOSIS — Z5111 Encounter for antineoplastic chemotherapy: Secondary | ICD-10-CM | POA: Diagnosis not present

## 2024-10-23 LAB — CMP (CANCER CENTER ONLY)
ALT: <5 U/L (ref 0–44)
AST: <10 U/L — ABNORMAL LOW (ref 15–41)
Albumin: 3.3 g/dL — ABNORMAL LOW (ref 3.5–5.0)
Alkaline Phosphatase: 66 U/L (ref 38–126)
Anion gap: 10 (ref 5–15)
BUN: 6 mg/dL — ABNORMAL LOW (ref 8–23)
CO2: 26 mmol/L (ref 22–32)
Calcium: 11 mg/dL — ABNORMAL HIGH (ref 8.9–10.3)
Chloride: 97 mmol/L — ABNORMAL LOW (ref 98–111)
Creatinine: 0.41 mg/dL — ABNORMAL LOW (ref 0.44–1.00)
GFR, Estimated: >60 mL/min (ref >60)
Glucose, Bld: 153 mg/dL — ABNORMAL HIGH (ref 70–99)
Potassium: 3.2 mmol/L — ABNORMAL LOW (ref 3.5–5.1)
Sodium: 133 mmol/L — ABNORMAL LOW (ref 135–145)
Total Bilirubin: 0.3 mg/dL (ref 0.0–1.2)
Total Protein: 7.7 g/dL (ref 6.5–8.1)

## 2024-10-23 LAB — CBC WITH DIFFERENTIAL (CANCER CENTER ONLY)
Abs Immature Granulocytes: 0.04 K/uL (ref 0.00–0.07)
Basophils Absolute: 0.1 K/uL (ref 0.0–0.1)
Basophils Relative: 1 %
Eosinophils Absolute: 0.1 K/uL (ref 0.0–0.5)
Eosinophils Relative: 1 %
HCT: 30.4 % — ABNORMAL LOW (ref 36.0–46.0)
Hemoglobin: 9.2 g/dL — ABNORMAL LOW (ref 12.0–15.0)
Immature Granulocytes: 0 %
Lymphocytes Relative: 12 %
Lymphs Abs: 1.2 K/uL (ref 0.7–4.0)
MCH: 24.2 pg — ABNORMAL LOW (ref 26.0–34.0)
MCHC: 30.3 g/dL (ref 30.0–36.0)
MCV: 80 fL (ref 80.0–100.0)
Monocytes Absolute: 1 K/uL (ref 0.1–1.0)
Monocytes Relative: 9 %
Neutro Abs: 8.2 K/uL — ABNORMAL HIGH (ref 1.7–7.7)
Neutrophils Relative %: 77 %
Platelet Count: 595 K/uL — ABNORMAL HIGH (ref 150–400)
RBC: 3.8 MIL/uL — ABNORMAL LOW (ref 3.87–5.11)
RDW: 17.7 % — ABNORMAL HIGH (ref 11.5–15.5)
WBC Count: 10.6 K/uL — ABNORMAL HIGH (ref 4.0–10.5)
nRBC: 0 % (ref 0.0–0.2)

## 2024-10-23 LAB — SAMPLE TO BLOOD BANK

## 2024-10-23 MED ORDER — TRAMADOL HCL 50 MG PO TABS: 50.0000 mg | ORAL_TABLET | Freq: Four times a day (QID) | ORAL | 0 refills | Status: DC | PRN

## 2024-10-23 MED ORDER — SODIUM CHLORIDE 0.9 % IV SOLN: INTRAVENOUS | Status: DC

## 2024-10-23 MED ORDER — PROCHLORPERAZINE MALEATE 10 MG PO TABS
10.0000 mg | ORAL_TABLET | Freq: Once | ORAL | Status: AC
  Administered 2024-10-23: 10 mg via ORAL
  Filled 2024-10-23: qty 1

## 2024-10-23 MED ORDER — SODIUM CHLORIDE 0.9 % IV SOLN
800.0000 mg/m2 | Freq: Once | INTRAVENOUS | Status: AC
  Administered 2024-10-23: 1330 mg via INTRAVENOUS
  Filled 2024-10-23: qty 34.98

## 2024-10-23 MED ORDER — ZOLEDRONIC ACID 4 MG/100ML IV SOLN
4.0000 mg | Freq: Once | INTRAVENOUS | Status: AC
  Administered 2024-10-23: 4 mg via INTRAVENOUS
  Filled 2024-10-23: qty 100

## 2024-10-23 MED ORDER — POTASSIUM CHLORIDE CRYS ER 20 MEQ PO TBCR: 20.0000 meq | EXTENDED_RELEASE_TABLET | Freq: Every day | ORAL | 0 refills | Status: DC

## 2024-10-23 MED ORDER — SODIUM CHLORIDE 0.9 % IV SOLN: Freq: Once | INTRAVENOUS | Status: AC

## 2024-10-23 NOTE — Progress Notes (Signed)
 Per Cassie Heilingoetter, PA, patient to receive Zometa today for hypercalcemia.  Per PA, patient instructed to consume no more than 1 to 2 Ensures/Glucernas daily due to potential contribution to hypercalcemia.  Patient verbalized understanding.  Patient made aware of potassium supplement called in to pharmacy by PA.

## 2024-10-23 NOTE — Patient Instructions (Addendum)
 [facility] - A DEPT OF [facility]  Discharge Instructions: Thank you for choosing [facility] to provide your oncology and hematology care.   If you have a lab appointment with the Cancer Center, please go directly to the Cancer Center and check in at the registration area.   Wear comfortable clothing and clothing appropriate for easy access to any Portacath or PICC line.   We strive to give you quality time with your provider. You may need to reschedule your appointment if you arrive late (15 or more minutes).  Arriving late affects you and other patients whose appointments are after yours.  Also, if you miss three or more appointments without notifying the office, you may be dismissed from the clinic at the provider's discretion.      For prescription refill requests, have your pharmacy contact our office and allow 72 hours for refills to be completed.    Today you received the following chemotherapy and/or immunotherapy agents: Gemcitabine      To help prevent nausea and vomiting after your treatment, we encourage you to take your nausea medication as directed.  BELOW ARE SYMPTOMS THAT SHOULD BE REPORTED IMMEDIATELY: *FEVER GREATER THAN 100.4 F (38 C) OR HIGHER *CHILLS OR SWEATING *NAUSEA AND VOMITING THAT IS NOT CONTROLLED WITH YOUR NAUSEA MEDICATION *UNUSUAL SHORTNESS OF BREATH *UNUSUAL BRUISING OR BLEEDING *URINARY PROBLEMS (pain or burning when urinating, or frequent urination) *BOWEL PROBLEMS (unusual diarrhea, constipation, pain near the anus) TENDERNESS IN MOUTH AND THROAT WITH OR WITHOUT PRESENCE OF ULCERS (sore throat, sores in mouth, or a toothache) UNUSUAL RASH, SWELLING OR PAIN  UNUSUAL VAGINAL DISCHARGE OR ITCHING   Items with * indicate a potential emergency and should be followed up as soon as possible or go to the Emergency Department if any problems should occur.  Please show the CHEMOTHERAPY ALERT CARD or  IMMUNOTHERAPY ALERT CARD at check-in to the Emergency Department and triage nurse.  Should you have questions after your visit or need to cancel or reschedule your appointment, please contact [facility] - A DEPT OF [facility]  Dept: [phone]  and follow the prompts.  Office hours are 8:00 a.m. to 4:30 p.m. Monday - Friday. Please note that voicemails left after 4:00 p.m. may not be returned until the following business day.  We are closed weekends and major holidays. You have access to a nurse at all times for urgent questions. Please call the main number to the clinic Dept: [phone] and follow the prompts.   For any non-urgent questions, you may also contact your provider using MyChart. We now offer e-Visits for anyone 23 and older to request care online for non-urgent symptoms. For details visit [email or web address].   Also download the MyChart app! Go to the app store, search MyChart, open the app, select [facility], and log in with your MyChart username and password.  Gemcitabine Injection What is this medication? GEMCITABINE (jem SYE ta been) treats some types of cancer. It works by slowing down the growth of cancer cells. This medicine may be used for other purposes; ask your health care provider or pharmacist if you have questions. COMMON BRAND NAME(S): Gemzar, Infugem What should I tell my care team before I take this medication? They need to know if you have any of these conditions: Blood disorders Infection Kidney disease Liver disease Lung or breathing disease, such as asthma or COPD Recent or ongoing  radiation therapy An unusual or allergic reaction to gemcitabine, other medications, foods, dyes, or preservatives If you or your partner are pregnant or trying to get pregnant Breast-feeding How should I use this medication? This medication is injected into a vein. It is given by your care team in a hospital or clinic setting. Talk  to your care team about the use of this medication in children. Special care may be needed. Overdosage: If you think you have taken too much of this medicine contact a poison control center or emergency room at once. NOTE: This medicine is only for you. Do not share this medicine with others. What if I miss a dose? Keep appointments for follow-up doses. It is important not to miss your dose. Call your care team if you are unable to keep an appointment. What may interact with this medication? Interactions have not been studied. This list may not describe all possible interactions. Give your health care provider a list of all the medicines, herbs, non-prescription drugs, or dietary supplements you use. Also tell them if you smoke, drink alcohol, or use illegal drugs. Some items may interact with your medicine. What should I watch for while using this medication? Your condition will be monitored carefully while you are receiving this medication. This medication may make you feel generally unwell. This is not uncommon, as chemotherapy can affect healthy cells as well as cancer cells. Report any side effects. Continue your course of treatment even though you feel ill unless your care team tells you to stop. In some cases, you may be given additional medications to help with side effects. Follow all directions for their use. This medication may increase your risk of getting an infection. Call your care team for advice if you get a fever, chills, sore throat, or other symptoms of a cold or flu. Do not treat yourself. Try to avoid being around people who are sick. This medication may increase your risk to bruise or bleed. Call your care team if you notice any unusual bleeding. Be careful brushing or flossing your teeth or using a toothpick because you may get an infection or bleed more easily. If you have any dental work done, tell your dentist you are receiving this medication. Avoid taking medications that  contain aspirin , acetaminophen , ibuprofen, naproxen, or ketoprofen unless instructed by your care team. These medications may hide a fever. Talk to your care team if you or your partner wish to become pregnant or think you might be pregnant. This medication can cause serious birth defects if taken during pregnancy and for 6 months after the last dose. A negative pregnancy test is required before starting this medication. A reliable form of contraception is recommended while taking this medication and for 6 months after the last dose. Talk to your care team about effective forms of contraception. Do not father a child while taking this medication and for 3 months after the last dose. Use a condom while having sex during this time period. Do not breastfeed while taking this medication and for at least 1 week after the last dose. This medication may cause infertility. Talk to your care team if you are concerned about your fertility. What side effects may I notice from receiving this medication? Side effects that you should report to your care team as soon as possible: Allergic reactions--skin rash, itching, hives, swelling of the face, lips, tongue, or throat Capillary leak syndrome--stomach or muscle pain, unusual weakness or fatigue, feeling faint or lightheaded, decrease in  the amount of urine, swelling of the ankles, hands, or feet, trouble breathing Infection--fever, chills, cough, sore throat, wounds that don't heal, pain or trouble when passing urine, general feeling of discomfort or being unwell Liver injury--right upper belly pain, loss of appetite, nausea, light-colored stool, dark yellow or brown urine, yellowing skin or eyes, unusual weakness or fatigue Low red blood cell level--unusual weakness or fatigue, dizziness, headache, trouble breathing Lung injury--shortness of breath or trouble breathing, cough, spitting up blood, chest pain, fever Stomach pain, bloody diarrhea, pale skin, unusual  weakness or fatigue, decrease in the amount of urine, which may be signs of hemolytic uremic syndrome Sudden and severe headache, confusion, change in vision, seizures, which may be signs of posterior reversible encephalopathy syndrome (PRES) Unusual bruising or bleeding Side effects that usually do not require medical attention (report to your care team if they continue or are bothersome): Diarrhea Drowsiness Hair loss Nausea Pain, redness, or swelling with sores inside the mouth or throat Vomiting This list may not describe all possible side effects. Call your doctor for medical advice about side effects. You may report side effects to FDA at 1-800-FDA-1088. Where should I keep my medication? This medication is given in a hospital or clinic. It will not be stored at home. NOTE: This sheet is a summary. It may not cover all possible information. If you have questions about this medicine, talk to your doctor, pharmacist, or health care provider.  2024 Elsevier/Gold Standard (2022-04-20 00:00:00)  Zoledronic Acid Injection (Cancer) What is this medication? ZOLEDRONIC ACID (ZOE le dron ik AS id) treats high calcium  levels in the blood caused by cancer. It may also be used with chemotherapy to treat weakened bones caused by cancer. It works by slowing down the release of calcium  from bones. This lowers calcium  levels in your blood. It also makes your bones stronger and less likely to break (fracture). It belongs to a group of medications called bisphosphonates. This medicine may be used for other purposes; ask your health care provider or pharmacist if you have questions. COMMON BRAND NAME(S): Zometa, Zometa Powder What should I tell my care team before I take this medication? They need to know if you have any of these conditions: Dehydration Dental disease Kidney disease Liver disease Low levels of calcium  in the blood Lung or breathing disease, such as asthma Receiving steroids, such as  dexamethasone  or prednisone  An unusual or allergic reaction to zoledronic acid, other medications, foods, dyes, or preservatives Pregnant or trying to get pregnant Breast-feeding How should I use this medication? This medication is injected into a vein. It is given by your care team in a hospital or clinic setting. Talk to your care team about the use of this medication in children. Special care may be needed. Overdosage: If you think you have taken too much of this medicine contact a poison control center or emergency room at once. NOTE: This medicine is only for you. Do not share this medicine with others. What if I miss a dose? Keep appointments for follow-up doses. It is important not to miss your dose. Call your care team if you are unable to keep an appointment. What may interact with this medication? Certain antibiotics given by injection Diuretics, such as bumetanide, furosemide  NSAIDs, medications for pain and inflammation, such as ibuprofen or naproxen Teriparatide Thalidomide This list may not describe all possible interactions. Give your health care provider a list of all the medicines, herbs, non-prescription drugs, or dietary supplements you use. Also  tell them if you smoke, drink alcohol, or use illegal drugs. Some items may interact with your medicine. What should I watch for while using this medication? Visit your care team for regular checks on your progress. It may be some time before you see the benefit from this medication. Some people who take this medication have severe bone, joint, or muscle pain. This medication may also increase your risk for jaw problems or a broken thigh bone. Tell your care team right away if you have severe pain in your jaw, bones, joints, or muscles. Tell you care team if you have any pain that does not go away or that gets worse. Tell your dentist and dental surgeon that you are taking this medication. You should not have major dental surgery  while on this medication. See your dentist to have a dental exam and fix any dental problems before starting this medication. Take good care of your teeth while on this medication. Make sure you see your dentist for regular follow-up appointments. You should make sure you get enough calcium  and vitamin D while you are taking this medication. Discuss the foods you eat and the vitamins you take with your care team. Check with your care team if you have severe diarrhea, nausea, and vomiting, or if you sweat a lot. The loss of too much body fluid may make it dangerous for you to take this medication. You may need bloodwork while taking this medication. Talk to your care team if you wish to become pregnant or think you might be pregnant. This medication can cause serious birth defects. What side effects may I notice from receiving this medication? Side effects that you should report to your care team as soon as possible: Allergic reactions--skin rash, itching, hives, swelling of the face, lips, tongue, or throat Kidney injury--decrease in the amount of urine, swelling of the ankles, hands, or feet Low calcium  level--muscle pain or cramps, confusion, tingling, or numbness in the hands or feet Osteonecrosis of the jaw--pain, swelling, or redness in the mouth, numbness of the jaw, poor healing after dental work, unusual discharge from the mouth, visible bones in the mouth Severe bone, joint, or muscle pain Side effects that usually do not require medical attention (report to your care team if they continue or are bothersome): Constipation Fatigue Fever Loss of appetite Nausea Stomach pain This list may not describe all possible side effects. Call your doctor for medical advice about side effects. You may report side effects to FDA at 1-800-FDA-1088. Where should I keep my medication? This medication is given in a hospital or clinic. It will not be stored at home. NOTE: This sheet is a summary. It may  not cover all possible information. If you have questions about this medicine, talk to your doctor, pharmacist, or health care provider.  2024 Elsevier/Gold Standard (2022-02-05 00:00:00)  Hypercalcemia Hypercalcemia is when the level of calcium  in a person's blood is above normal. The body needs calcium  to make bones and keep them strong. Calcium  also helps the muscles, nerves, brain, and heart work the way they should. Most of the calcium  in the body is stored in the bones. There is also calcium  in the blood. Hypercalcemia occurs when there is too much calcium  in your blood. Calcium  levels in the blood are regulated by hormones, kidneys, and the gastrointestinal tract.  Hypercalcemia can happen when calcium  comes out of the bones, or when the kidneys are not able to remove calcium  from the blood. Hypercalcemia can  be mild or severe. What are the causes? There are many possible causes of hypercalcemia. Common causes of this condition include: Hyperparathyroidism. This is a condition in which the body produces too much parathyroid hormone. There are four parathyroid glands in your neck. These glands produce a chemical messenger (hormone) that helps the body absorb calcium  from foods and helps your bones release calcium . Certain kinds of cancer. Less common causes of hypercalcemia include: Calcium  and vitamin D dietary supplements. Chronic kidney disease. Hyperthyroidism. Severe dehydration. Being on bed rest or being inactive for a long time. Certain medicines. Infections. What increases the risk? You are more likely to develop this condition if: You are female. You are 38 years of age or older. You have a family history of hypercalcemia. What are the signs or symptoms? Mild hypercalcemia that starts slowly may not cause symptoms. Severe, sudden hypercalcemia is more likely to cause symptoms, such as: Being more thirsty than usual. Needing to urinate more often than usual. Abdominal  pain. Nausea and vomiting. Constipation. Muscle pain, twitching, or weakness. Feeling very tired. How is this diagnosed?  Hypercalcemia is usually diagnosed with a blood test. You may also have tests to help check what is causing this condition. Tests include imaging tests and more blood tests. How is this treated? Treatment for hypercalcemia depends on the cause. Treatment may include: Receiving fluids through an IV. Medicines. These can be used to: Keep calcium  levels steady after receiving fluids (loop diuretics). Keep calcium  in your bones (bisphosphonates). Lower the calcium  level in your blood. Surgery to remove overactive parathyroid glands. A procedure that filters your blood to correct calcium  levels (hemodialysis). Follow these instructions at home:  Take over-the-counter and prescription medicines only as told by your health care provider. Follow instructions from your health care provider about eating or drinking restrictions. Drink enough fluid to keep your urine pale yellow. Stay active. Weight-bearing exercise helps to keep calcium  in your bones. Follow instructions from your health care provider about what type and level of exercise is safe for you. Keep all follow-up visits. This is important. Contact a health care provider if: You have a fever. Your heartbeat is irregular or very fast. You have changes in mood, memory, or personality. Get help right away if: You have severe abdominal pain. You have chest pain. You have trouble breathing. You become very confused and sleepy. You lose consciousness. These symptoms may represent a serious problem that is an emergency. Do not wait to see if the symptoms will go away. Get medical help right away. Call your local emergency services (911 in the [place] Do not drive yourself to the hospital. Summary Hypercalcemia is when the level of calcium  in a person's blood is above normal. The body needs calcium  to make bones and  keep them strong. There are many possible causes of hypercalcemia, and treatment depends on the cause. Take over-the-counter and prescription medicines only as told by your health care provider. This information is not intended to replace advice given to you by your health care provider. Make sure you discuss any questions you have with your health care provider. Document Revised: 05/20/2021 Document Reviewed: 05/20/2021 Elsevier Patient Education  2024 Elsevier Inc.  Hypokalemia Hypokalemia means that the amount of potassium in the blood is lower than normal. Potassium is a mineral (electrolyte) that helps regulate the amount of fluid in the body. It also stimulates muscle tightening (contraction) and helps nerves work properly. Normally, most of the body's potassium is inside cells, and  only a very small amount is in the blood. Because the amount in the blood is so small, minor changes to potassium levels in the blood can be life-threatening. What are the causes? This condition may be caused by: Antibiotic medicine. Diarrhea or vomiting. Taking too much of a medicine that helps you have a bowel movement (laxative) can cause diarrhea and lead to hypokalemia. Chronic kidney disease (CKD). Medicines that help the body get rid of excess fluid (diuretics). Eating disorders, such as anorexia or bulimia. Low magnesium  levels in the body. Sweating a lot. What are the signs or symptoms? Symptoms of this condition include: Weakness. Constipation. Fatigue. Muscle cramps. Mental confusion. Skipped heartbeats or irregular heartbeat (palpitations). Tingling or numbness. How is this diagnosed? This condition is diagnosed with a blood test. How is this treated? This condition may be treated by: Taking potassium supplements. Adjusting the medicines that you take. Eating more foods that contain a lot of potassium. If your potassium level is very low, you may need to get potassium through an IV  and be monitored in the hospital. Follow these instructions at home: Eating and drinking  Eat a healthy diet. A healthy diet includes fresh fruits and vegetables, whole grains, healthy fats, and lean proteins. If told, eat more foods that contain a lot of potassium. These include: Nuts, such as peanuts and pistachios. Seeds, such as sunflower seeds and pumpkin seeds. Peas, lentils, and lima beans. Whole grain and bran cereals and breads. Fresh fruits and vegetables, such as apricots, avocado, bananas, cantaloupe, kiwi, oranges, tomatoes, asparagus, and potatoes. Juices, such as orange, tomato, and prune. Lean meats, including fish. Milk and milk products, such as yogurt. General instructions Take over-the-counter and prescription medicines only as told by your health care provider. This includes vitamins, natural food products, and supplements. Keep all follow-up visits. This is important. Contact a health care provider if: You have weakness that gets worse. You feel your heart pounding or racing. You vomit. You have diarrhea. You have diabetes and you have trouble keeping your blood sugar in your target range. Get help right away if: You have chest pain. You have shortness of breath. You have vomiting or diarrhea that lasts for more than 2 days. You faint. These symptoms may be an emergency. Get help right away. Call 911. Do not wait to see if the symptoms will go away. Do not drive yourself to the hospital. Summary Hypokalemia means that the amount of potassium in the blood is lower than normal. This condition is diagnosed with a blood test. Hypokalemia may be treated by taking potassium supplements, adjusting the medicines that you take, or eating more foods that are high in potassium. If your potassium level is very low, you may need to get potassium through an IV and be monitored in the hospital. This information is not intended to replace advice given to you by your health  care provider. Make sure you discuss any questions you have with your health care provider. Document Revised: 08/27/2021 Document Reviewed: 08/27/2021 Elsevier Patient Education  2024 [employer].

## 2024-10-24 ENCOUNTER — Telehealth: Payer: Self-pay

## 2024-10-24 NOTE — Telephone Encounter (Signed)
 Dr. Sherrod, first time Gemzar and Zometa f/u call, pt tolerated well Received: Nilsa Metro Katrinka DELENA, RN  P Onc Triage Nurse [facility] Caller: Unspecified Tanis,  5:26 PM)

## 2024-10-26 ENCOUNTER — Other Ambulatory Visit: Payer: Self-pay | Admitting: Physician Assistant

## 2024-10-26 DIAGNOSIS — E876 Hypokalemia: Secondary | ICD-10-CM

## 2024-10-26 NOTE — Progress Notes (Signed)
 Lindsey Williamson  Lindsey Luke POUR, MD [address] [place] [address]  DIAGNOSIS: 1) history of stage Ia non-small cell lung cancer, adenocarcinoma the right upper lobe who under went radiation under the care of Dr. Patrcia from 04/23/2022-04/29/22 2) Recurrent lung cancer, initially diagnosed as Stage IIIa confirm (T3, N2, M0) non-small cell lung cancer, squamous cell carcinoma. She presented with recurrent tumor at the right lung base, associated right lower lobe metastasis, and right hilar and subcarinal nodal metastases. This was diagnosed in October 2024.   PDL1: 85%   PRIOR THERAPY: 1) Radiation to the right upper lobe under the care of Dr. Patrcia from 04/23/2022-04/29/22  2) Concurrent chemoradiation with carboplatin  for an AUC of 2 and paclitaxel  45 mg/m.  Last dose on 12/20/23 Status post 7 cycle.  3) Consolidation immunotherapy with Imfinzi  1500 mg IV every 4 weeks, first dose expected on 02/07/2024. Discontinued after 1 cycle due to concern for pneumonitis. 4) Palliative systemic chemotherapy with carboplatin  for an AUC of 5, paclitaxel  175 mg/m, Keytruda  200 mg IV every 3 weeks first dose on 07/23/2024.  She will also receive Neulasta  support.  Her dose of chemotherapy will be reduced to AUC of 4 and 150 mg/m starting from cycle #2.  She received a total of 3 cycles of treatment including the last cycle with single agent Keytruda .  Last dose was given on 09/24/2024 discontinued secondary to disease progression  CURRENT THERAPY: Gemcitabine 800 mg/M2 on days 1 and 8 every 3 weeks. First dose October 23, 2024. Status post day 1 cycle 1.   INTERVAL HISTORY: Lindsey Williamson 73 y.o. female returns to the clinic today for a follow-up visit.  The patient was last seen by Dr. Sherrod on 10/15/2024.  She was found to have evidence of disease progression at that time.  Therefore, Dr. Sherrod discontinued her treatment and started her on  chemotherapy with gemcitabine.  She status post day 1 cycle #1.  She is on a reduced dose of gemcitabine.She finds the treatment tolerable but was tired and slept a lot.  The patient states that she has been thinking a lot about it and she is contemplating hospice care. She states she had a good life and is just tired.  She would like to see how another cycle of treatment goes before making a decision.  She is concerned about her family being upset with her choice for hospice. She wants to move forward with treatment today.   She is concerned about elevated calcium  levels noted last week. She is not taking calcium  supplements but consumes up to five dairy-based nutritional drinks daily, such as Boost and Ensure.   She experiences significant taste changes, particularly with meats, which are difficult to swallow. She can consume liquids and softer foods like mashed potatoes, noodles, and rice without issue.  She has oral thrush affecting her taste and uses a mouth rinse four times a day. She cannot use Diflucan  due to drug interactions with her current medications.  She uses an inhaler with a steroid once a day. She does not rinse her mouth after this.   She takes tramadol every 6 hours for cancer related pain.  She did not bring her medication with her today and she is due for tramadol.  She reports fatigue and significant weight loss, having lost seven pounds recently. She is trying to gain weight by drinking protein drinks but struggles with appetite and taste changes. No nausea  or vomiting, but she has experienced some constipation, managed with a stool softener.   The patient has a history of pleural effusions.  She was being considered for possible PleurX catheter management but she decided against it. She does report coughing, especially when laying down at night. She also wears supplemental oxygen for shortness of breath.   He also has a history of hemoptysis for which IR previously said  that if she had recurrent hemoptysis they can consider her for embolization.  She is here today for evaluation and repeat blood work before undergoing day 8 cycle 1.    MEDICAL HISTORY: Past Medical History:  Diagnosis Date   Angioedema 10/17/2023   Arthritis    Diabetes mellitus without complication (HCC)    Hypertension     ALLERGIES:  is allergic to benzonatate, lisinopril, aspirin , azithromycin , and codeine .  MEDICATIONS:  Current Outpatient Medications  Medication Sig Dispense Refill   albuterol  (VENTOLIN  HFA) 108 (90 Base) MCG/ACT inhaler Inhale 2 puffs into the lungs every 6 (six) hours as needed for wheezing or shortness of breath. 20.1 g 2   aspirin  EC 81 MG tablet Take 81 mg by mouth daily. Swallow whole.     bisoprolol  (ZEBETA ) 10 MG tablet Take 1 tablet (10 mg total) by mouth daily. 90 tablet 3   cetirizine  (ZYRTEC  ALLERGY ) 10 MG tablet Take 1 tablet (10 mg total) by mouth daily. 90 tablet 1   diltiazem  (CARDIZEM  CD) 360 MG 24 hr capsule Take 1 capsule (360 mg total) by mouth daily. 90 capsule 3   empagliflozin  (JARDIANCE ) 25 MG TABS tablet Take 25 mg by mouth daily at 6 PM.     ferrous sulfate  325 (65 FE) MG tablet Take 325 mg by mouth daily with breakfast.     glipiZIDE  (GLUCOTROL  XL) 5 MG 24 hr tablet Take 1 tablet (5 mg total) by mouth daily with breakfast. 30 tablet 0   guaiFENesin -dextromethorphan  (ROBITUSSIN DM) 100-10 MG/5ML syrup Take 5 mLs by mouth every 4 (four) hours as needed for cough. 236 mL 0   HYDROcodone  bit-homatropine (HYDROMET) 5-1.5 MG/5ML syrup Take 5 mLs by mouth every 6 (six) hours as needed for cough. 240 mL 0   insulin  aspart (NOVOLOG ) 100 UNIT/ML FlexPen Inject 0-11 Units into the skin 3 (three) times daily with meals. Check Blood Glucose (BG) and inject per scale: BG <150= 0 unit; BG 150-200= 1 unit; BG 201-250= 3 unit; BG 251-300= 5 unit; BG 301-350= 7 unit; BG 351-400= 9 unit; BG >400= 11 unit and Call Primary Care. (Patient taking differently:  Inject 0-11 Units into the skin as needed for high blood sugar. Check Blood Glucose (BG) and inject per scale: BG <150= 0 unit; BG 150-200= 1 unit; BG 201-250= 3 unit; BG 251-300= 5 unit; BG 301-350= 7 unit; BG 351-400= 9 unit; BG >400= 11 unit and Call Primary Care.) 15 mL 0   ipratropium-albuterol  (DUONEB) 0.5-2.5 (3) MG/3ML SOLN Take 3 mLs by nebulization in the morning and at bedtime. 360 mL 2   metFORMIN  (GLUCOPHAGE ) 1000 MG tablet Take 1 tablet (1,000 mg total) by mouth 2 (two) times daily with a meal.     nystatin  (MYCOSTATIN ) 100000 UNIT/ML suspension Take 5 mLs (500,000 Units total) by mouth 4 (four) times daily. 60 mL 0   nystatin  (MYCOSTATIN /NYSTOP ) powder Apply 1 Application topically 3 (three) times daily. 30 g 0   polyethylene glycol (MIRALAX  / GLYCOLAX ) 17 g packet Take 17 g packet by mouth daily. 14 each 0  potassium chloride  SA (KLOR-CON  M) 20 MEQ tablet Take 1 tablet (20 mEq total) by mouth daily. 6 tablet 0   prochlorperazine  (COMPAZINE ) 10 MG tablet Take 1 tablet (10 mg total) by mouth every 6 (six) hours as needed. (Patient taking differently: Take 10 mg by mouth every 6 (six) hours as needed for nausea or vomiting.) 30 tablet 2   senna-docusate (SENOKOT-S) 8.6-50 MG tablet Take 1 tablet by mouth 2 (two) times daily.     traMADol (ULTRAM) 50 MG tablet Take 1 tablet (50 mg total) by mouth every 6 (six) hours as needed. 30 tablet 0   umeclidinium-vilanterol (ANORO ELLIPTA ) 62.5-25 MCG/ACT AEPB Inhale 1 puff into the lungs daily. 14 each 6   No current facility-administered medications for this visit.    SURGICAL HISTORY:  Past Surgical History:  Procedure Laterality Date   BRONCHIAL BIOPSY  03/30/2022   Procedure: BRONCHIAL BIOPSIES;  Surgeon: Brenna Adine CROME, DO;  Location: [facility] ENDOSCOPY;  Service: Pulmonary;;   BRONCHIAL BIOPSY  10/17/2023   Procedure: BRONCHIAL BIOPSIES;  Surgeon: Shelah Lamar RAMAN, MD;  Location: [facility];  Service: Pulmonary;;   BRONCHIAL BRUSHINGS   03/30/2022   Procedure: BRONCHIAL BRUSHINGS;  Surgeon: Brenna Adine CROME, DO;  Location: [facility] ENDOSCOPY;  Service: Pulmonary;;   BRONCHIAL BRUSHINGS  10/17/2023   Procedure: BRONCHIAL BRUSHINGS;  Surgeon: Shelah Lamar RAMAN, MD;  Location: [facility] ENDOSCOPY;  Service: Pulmonary;;   BRONCHIAL NEEDLE ASPIRATION BIOPSY  03/30/2022   Procedure: BRONCHIAL NEEDLE ASPIRATION BIOPSIES;  Surgeon: Brenna Adine CROME, DO;  Location: [facility]OS[facility];  Service: Pulmonary;;   BRONCHIAL NEEDLE ASPIRATION BIOPSY  10/17/2023   Procedure: BRONCHIAL NEEDLE ASPIRATION BIOPSIES;  Surgeon: Shelah Lamar RAMAN, MD;  Location: [facility] ENDOSCOPY;  Service: Pulmonary;;   BRONCHIAL WASHINGS  05/19/2024   Procedure: IRRIGATION, BRONCHUS;  Surgeon: Jude Harden GAILS, MD;  Location: [facility] ENDOSCOPY;  Service: Cardiopulmonary;;   EYE SURGERY     FIDUCIAL MARKER PLACEMENT  03/30/2022   Procedure: FIDUCIAL MARKER PLACEMENT;  Surgeon: Brenna Adine CROME, DO;  Location: [facility] ENDOSCOPY;  Service: Pulmonary;;   FINE NEEDLE ASPIRATION  10/17/2023   Procedure: FINE NEEDLE ASPIRATION;  Surgeon: Shelah Lamar RAMAN, MD;  Location: [facility] ENDOSCOPY;  Service: Pulmonary;;   FOREIGN BODY REMOVAL  03/30/2022   Procedure: FOREIGN BODY REMOVAL;  Surgeon: Brenna Adine CROME, DO;  Location: [facility] ENDOSCOPY;  Service: Pulmonary;;   HEMOSTASIS CONTROL  10/17/2023   Procedure: HEMOSTASIS CONTROL;  Surgeon: Shelah Lamar RAMAN, MD;  Location: [facility] ENDOSCOPY;  Service: Pulmonary;;   IR THORACENTESIS ASP PLEURAL SPACE W/IMG GUIDE  08/21/2024   IR THORACENTESIS ASP PLEURAL SPACE W/IMG GUIDE  08/30/2024   IR THORACENTESIS ASP PLEURAL SPACE W/IMG GUIDE  09/25/2024   VIDEO BRONCHOSCOPY Bilateral 05/19/2024   Procedure: VIDEO BRONCHOSCOPY WITHOUT FLUORO;  Surgeon: Jude Harden GAILS, MD;  Location: [facility] ENDOSCOPY;  Service: Cardiopulmonary;  Laterality: Bilateral;   VIDEO BRONCHOSCOPY WITH ENDOBRONCHIAL ULTRASOUND Bilateral 03/30/2022   Procedure: VIDEO BRONCHOSCOPY WITH ENDOBRONCHIAL ULTRASOUND;  Surgeon: Brenna Adine CROME,  DO;  Location: [facility] ENDOSCOPY;  Service: Pulmonary;  Laterality: Bilateral;   VIDEO BRONCHOSCOPY WITH ENDOBRONCHIAL ULTRASOUND Right 10/17/2023   Procedure: VIDEO BRONCHOSCOPY WITH ENDOBRONCHIAL ULTRASOUND;  Surgeon: Shelah Lamar RAMAN, MD;  Location: [facility] ENDOSCOPY;  Service: Pulmonary;  Laterality: Right;   VIDEO BRONCHOSCOPY WITH RADIAL ENDOBRONCHIAL ULTRASOUND  03/30/2022   Procedure: VIDEO BRONCHOSCOPY WITH RADIAL ENDOBRONCHIAL ULTRASOUND;  Surgeon: Brenna Adine CROME, DO;  Location: [facility] ENDOSCOPY;  Service: Pulmonary;;    REVIEW OF SYSTEMS:   Review of  Systems  Constitutional: Positive for fatigue, weight loss, appetite change.  Negative for chills and fever.  HENT: Positive for taste alterations.  Negative for mouth sores, nosebleeds, sore throat and trouble swallowing.   Eyes: Negative for eye problems and icterus.  Respiratory: Positive for shortness of breath and cough.   Cardiovascular: Negative for chest pain and leg swelling.  Gastrointestinal: Negative for abdominal pain, constipation, diarrhea, nausea and vomiting.  Genitourinary: Negative for bladder incontinence, difficulty urinating, dysuria, frequency and hematuria.   Musculoskeletal: Positive for cancer related pain.  Negative for gait problem, neck pain and neck stiffness.  Skin: Negative for itching and rash.  Neurological: Negative for dizziness, extremity weakness, gait problem, headaches, light-headedness and seizures.  Hematological: Negative for adenopathy. Does not bruise/bleed easily.  Psychiatric/Behavioral: Negative for confusion, depression and sleep disturbance. The patient is not nervous/anxious.     PHYSICAL EXAMINATION:  Blood pressure 114/77, pulse 81, temperature 98 F (36.7 C), resp. rate 17, height 5' 4 (1.626 m), weight 129 lb (58.5 kg), SpO2 100%.  ECOG PERFORMANCE STATUS: 2-3  Physical Exam  Constitutional: Oriented to person, place, and time and chronically ill-appearing female and in no distress. HENT:   Head: Normocephalic and atraumatic.  Mouth/Throat: Oropharynx is clear and moist. No oropharyngeal exudate.  Eyes: Conjunctivae are normal. Right eye exhibits no discharge. Left eye exhibits no discharge. No scleral icterus.  Neck: Normal range of motion. Neck supple.  Cardiovascular: Normal rate, regular rhythm, normal heart sounds and intact distal pulses.   Pulmonary/Chest: Effort normal.  Quiet breath sounds at the base of the right lung. No respiratory distress. No wheezes. No rales.  On supplemental oxygen. Abdominal: Soft. Bowel sounds are normal. Exhibits no distension and no mass. There is no tenderness.  Musculoskeletal: Normal range of motion. Exhibits no edema.  Lymphadenopathy:    No cervical adenopathy.  Neurological: Alert and oriented to person, place, and time.  Muscle wasting.  She was examined in the wheelchair.  Skin: Skin is warm and dry. No rash noted. Not diaphoretic. No erythema. No pallor.  Psychiatric: Mood, memory and judgment normal.  Vitals reviewed.  LABORATORY DATA: Lab Results  Component Value Date   WBC 6.4 10/29/2024   HGB 9.2 (L) 10/29/2024   HCT 30.7 (L) 10/29/2024   MCV 81.0 10/29/2024   PLT 457 (H) 10/29/2024      Chemistry      Component Value Date/Time   NA 132 (L) 10/29/2024 1432   K 4.0 10/29/2024 1432   CL 98 10/29/2024 1432   CO2 17 (L) 10/29/2024 1432   BUN 5 (L) 10/29/2024 1432   CREATININE 0.50 10/29/2024 1432      Component Value Date/Time   CALCIUM  9.0 10/29/2024 1432   ALKPHOS 85 10/29/2024 1432   AST 16 10/29/2024 1432   ALT <5 10/29/2024 1432   BILITOT 0.3 10/29/2024 1432       RADIOGRAPHIC STUDIES:  CT CHEST ABDOMEN PELVIS W CONTRAST Result Date: 10/10/2024 CLINICAL DATA:  Right lower lobe cancer, follow-up. * Tracking Code: BO * EXAM: CT CHEST, ABDOMEN, AND PELVIS WITH CONTRAST TECHNIQUE: Multidetector CT imaging of the chest, abdomen and pelvis was performed following the standard protocol during bolus  administration of intravenous contrast. RADIATION DOSE REDUCTION: This exam was performed according to the departmental dose-optimization program which includes automated exposure control, adjustment of the mA and/or kV according to patient size and/or use of iterative reconstruction technique. CONTRAST:  OMNIPAQUE  IOHEXOL  300 MG/ML  SOLN COMPARISON:  Multiple priors  including PET-CT July 16, 2024 and CT chest June 28, 2024. FINDINGS: CT CHEST FINDINGS Cardiovascular: Aortic atherosclerosis. Borderline cardiac enlargement. Three-vessel coronary artery calcifications. Enlarged main pulmonary artery. Mediastinum/Nodes: No suspicious thyroid  nodule. Increased size of the mediastinal and right hilar metastatic lymph nodes. For reference: The precarinal lymph node measures 2.3 cm in short axis on image 23/2 previously 17 mm. Patulous esophagus. Lungs/Pleura: Necrotic mass in the right perihilar lung measures 5.1 x 4.3 cm on image 31/2 there is similar filling of the airways with downstream obstructive atelectasis. Similar right-greater-than-left nodular interstitial thickening. Similar moderate right pleural effusion. Subpleural left lower lobe mass measures 4.7 x 3.2 cm on image 92/9 previously 2.7 x 2.5 cm. Emphysema with bilateral subpleural reticulations. Musculoskeletal: Similar diffuse marrow heterogeneity which was not hypermetabolic on prior PET-CT. Thoracic spondylosis. CT ABDOMEN PELVIS FINDINGS Hepatobiliary: No suspicious hepatic lesion. Gallbladder is unremarkable. New prominence of the biliary tree with the common duct measuring 7 mm in diameter on image 54/2 . Pancreas: Pancreatic ductal dilation to the ampulla measuring up to 8 mm with a questionable 7 mm hyperenhancing mass at the ampulla on image 62/7. Spleen: No splenomegaly. Adrenals/Urinary Tract: Stable 11 mm right adrenal nodule which was not hypermetabolic on prior PET-CT. No suspicious left adrenal nodule. No hydronephrosis. Kidneys  demonstrate symmetric enhancement. Urinary bladder is unremarkable for degree of distension. Stomach/Bowel: No radiopaque enteric contrast material was administered. Stomach is unremarkable for degree of distension. No pathologic dilation of small or large bowel. Colonic diverticulosis without findings of acute diverticulitis. Vascular/Lymphatic: Aortic atherosclerosis. Normal caliber abdominal aorta. Smooth IVC contours. The portal, splenic and superior mesenteric veins are patent. No pathologically enlarged abdominal or pelvic lymph nodes. Reproductive: Uterine leiomyomas. Other: No significant abdominopelvic free fluid. Musculoskeletal: Similar heterogeneous marrow which was not hypermetabolic on prior PET-CT. Superior endplate compression deformity at L1 is unchanged. Lumbar spondylosis. Degenerative change of the bilateral hips. IMPRESSION: 1. Increased size of the necrotic right perihilar mass with similar filling of the airways with downstream obstructive atelectasis. 2. Increased size of the subpleural left lower lobe mass. 3. Increased size of the mediastinal and hilar metastatic lymph nodes. 4. 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 5. Similar moderate right pleural effusion. 6. New prominence of the biliary tree with pancreatic ductal dilation to the ampulla measuring up to 8 mm and a questionable 7 mm hyperenhancing mass at the ampulla, suggest further evaluation with EUS/ERCP. 7. Similar diffuse marrow heterogeneity which was not hypermetabolic on prior PET-CT. Electronically Signed   By: Reyes Holder M.D.   On: 10/10/2024 16:19   DG Chest 2 View Result Date: 10/07/2024 CLINICAL DATA:  Shortness of breath EXAM: CHEST - 2 VIEW COMPARISON:  09/25/2024 FINDINGS: No significant change in chest radiographs, with gross cardiomegaly, a large right pleural effusion and diffuse bilateral interstitial opacity. No new or focal airspace  opacity. No acute osseous findings. IMPRESSION: No significant change in chest radiographs, with gross cardiomegaly, a large right pleural effusion and diffuse bilateral interstitial opacity, consistent with edema or infection. No new or focal airspace opacity. Electronically Signed   By: Marolyn JONETTA Jaksch M.D.   On: 10/07/2024 12:11     ASSESSMENT/PLAN:  This is a very pleasant 73 year old African-American female with:  1) history of stage Ia non-small cell lung cancer, adenocarcinoma the right upper lobe who under went radiation under the care of Dr. Patrcia from 04/23/2022-04/29/22 2 Recurrent lung cancer, initially diagnosed as Stage IIIa confirm (T3, N2, M0) non-small cell  lung cancer, squamous cell carcinoma. She presented with recurrent tumor at the right lung base, associated right lower lobe metastasis, and right hilar and subcarinal nodal metastases. This was diagnosed in October 2024.    PDL1 expression 85%.    The patient is scheduled to undergo consolidation immunotherapy with Imfinzi  1500 mg IV every 4 weeks . First dose on 02/07/24.  She is status post 1 cycle.  Her treatment was discontinued due to concerns with pneumonitis.   She was found to have disease progresion in July 2025.    Therefore, Dr. Sherrod recommended palliative systemic chemotherapy with carboplatin  for an AUC of 5 and taxol  175 mg/m2 and Keytruda  200 mg IV every 3 weeks.  She is status post 3 cycle. She underwent cycle #1 on 07/23/24. Starting from cycle #2, Dr. Sherrod reduced the dose of carboplatin  to an AUC of 4 and taxol  to 150 mg/m2.  Starting from cycle #3 she was on single agent immunotherapy with Keytruda  due to intolerance.  She was found to have disease progression and started on gemcitabine 800 mg/m2 on days 1 and 8 IV every 3 weeks on 10/23/2024.   The patient and I had a lengthy discussion today.  The patient would like to continue with this cycle of treatment.  However she is contemplating quality versus  quantity of life and she is contemplating hospice care.  During the appointment she was accompanied by her son who seem to have gotten upset with this.   Her labs are acceptable for treatment but she was not able to get treatment because when she went to the infusion room she developed pain back pain.  She takes tramadol every 6 hours and did not bring her tramadol to her appointment today.  She received tylenol . She also is reporting some shortness of breath.  I had previously arrange for a chest x-ray to assess her pleural effusion and we will arrange for a thoracentesis if needed.  She develops any new or worsening symptoms she was advised to seek emergency evaluation.  She had a repeat EKG.  The patient saw pulmonary medicine and she was not interested in a PleurX catheter.  She is scheduled to see Dr. Sherrod in 2 weeks before undergoing day 1 cycle #2.  They may have a more detailed goals of care discussion at that time based on what the patient decides.  The patient has oral thrush.  She is not able to take Diflucan  due to drug-drug interactions.  I refilled her nystatin .  She likely has oral thrush because she uses an inhaler with steroid and does not rinse her mouth afterwards.  I did recommend rinsing her mouth and doing salt water rinses.  I also asked that she reach out to her pulmonologist about a spacer for her inhaler.  We will also reach out to pulmonary medicine because she needs a refill of her cough medication.  I will reach out to the chaplain to see if they can talk to the patient during her next infusion.   The patient will try to vary her diet due to her taste alterations.  She likely has some taste alterations from chemotherapy as well as her oral thrush.  He is not a good candidate for Marinol due to her baseline fatigue.  She is not a good candidate for Megace due due to the risk of blood clot and sedentary lifestyle.  She is not a good candidate for Remeron due to drug to drug  interactions and  prolonged QT with her other medications.   We will see her back in 2 weeks before undergoing day 1 cycle 2.   Previously seen a member of the nutritionist team.   She will continue with stool softeners to help with her constipation secondary to her iron supplement.   - Continue tramadol as needed.    The patient was advised to call immediately if she has any concerning symptoms in the interval. The patient voices understanding of current disease status and treatment options and is in agreement with the current care plan. All questions were answered. The patient knows to call the clinic with any problems, questions or concerns. We can certainly see the patient much sooner if necessary     Orders Placed This Encounter  Procedures   DG Chest 2 View    Standing Status:   Future    Number of Occurrences:   1    Expected Date:   10/29/2024    Expiration Date:   10/29/2025    Reason for Exam (SYMPTOM  OR DIAGNOSIS REQUIRED):   Assess malignant effusion to see if thora is needed    Preferred imaging location?:   [facility]   CBC with Differential ([facility] Only)    Standing Status:   Future    Expected Date:   10/31/2024    Expiration Date:   10/31/2025   CMP ([facility] only)    Standing Status:   Future    Expected Date:   10/31/2024    Expiration Date:   10/31/2025     The total time spent in the appointment was 40+ minutes  Lindsey Stotler L Avien Taha, Lindsey Williamson 10/29/24

## 2024-10-26 NOTE — Telephone Encounter (Signed)
 Completed course

## 2024-10-29 ENCOUNTER — Other Ambulatory Visit: Payer: Self-pay

## 2024-10-29 ENCOUNTER — Ambulatory Visit (HOSPITAL_COMMUNITY)
Admission: RE | Admit: 2024-10-29 | Discharge: 2024-10-29 | Disposition: A | Discharge status: Home / Self Care | Source: Ambulatory Visit | Attending: Physician Assistant | Admitting: Physician Assistant

## 2024-10-29 ENCOUNTER — Inpatient Hospital Stay

## 2024-10-29 ENCOUNTER — Inpatient Hospital Stay: Attending: Radiation Oncology

## 2024-10-29 ENCOUNTER — Inpatient Hospital Stay (HOSPITAL_BASED_OUTPATIENT_CLINIC_OR_DEPARTMENT_OTHER): Admitting: Physician Assistant

## 2024-10-29 VITALS — BP 102/48 | HR 77 | Temp 97.8°F | Resp 16

## 2024-10-29 VITALS — BP 114/77 | HR 81 | Temp 98.0°F | Resp 17 | Ht 64.0 in | Wt 129.0 lb

## 2024-10-29 DIAGNOSIS — C3431 Malignant neoplasm of lower lobe, right bronchus or lung: Secondary | ICD-10-CM

## 2024-10-29 DIAGNOSIS — D649 Anemia, unspecified: Secondary | ICD-10-CM

## 2024-10-29 DIAGNOSIS — R630 Anorexia: Secondary | ICD-10-CM | POA: Diagnosis not present

## 2024-10-29 DIAGNOSIS — R079 Chest pain, unspecified: Secondary | ICD-10-CM

## 2024-10-29 LAB — CBC WITH DIFFERENTIAL (CANCER CENTER ONLY)
Abs Immature Granulocytes: 0.04 K/uL (ref 0.00–0.07)
Basophils Absolute: 0 K/uL (ref 0.0–0.1)
Basophils Relative: 1 %
Eosinophils Absolute: 0 K/uL (ref 0.0–0.5)
Eosinophils Relative: 1 %
HCT: 30.7 % — ABNORMAL LOW (ref 36.0–46.0)
Hemoglobin: 9.2 g/dL — ABNORMAL LOW (ref 12.0–15.0)
Immature Granulocytes: 1 %
Lymphocytes Relative: 12 %
Lymphs Abs: 0.8 K/uL (ref 0.7–4.0)
MCH: 24.3 pg — ABNORMAL LOW (ref 26.0–34.0)
MCHC: 30 g/dL (ref 30.0–36.0)
MCV: 81 fL (ref 80.0–100.0)
Monocytes Absolute: 0.7 K/uL (ref 0.1–1.0)
Monocytes Relative: 10 %
Neutro Abs: 4.9 K/uL (ref 1.7–7.7)
Neutrophils Relative %: 75 %
Platelet Count: 457 K/uL — ABNORMAL HIGH (ref 150–400)
RBC: 3.79 MIL/uL — ABNORMAL LOW (ref 3.87–5.11)
RDW: 17.9 % — ABNORMAL HIGH (ref 11.5–15.5)
WBC Count: 6.4 K/uL (ref 4.0–10.5)
nRBC: 0.3 % — ABNORMAL HIGH (ref 0.0–0.2)

## 2024-10-29 LAB — SAMPLE TO BLOOD BANK

## 2024-10-29 LAB — CMP (CANCER CENTER ONLY)
ALT: <5 U/L (ref 0–44)
AST: 16 U/L (ref 15–41)
Albumin: 3.2 g/dL — ABNORMAL LOW (ref 3.5–5.0)
Alkaline Phosphatase: 85 U/L (ref 38–126)
Anion gap: 17 — ABNORMAL HIGH (ref 5–15)
BUN: 5 mg/dL — ABNORMAL LOW (ref 8–23)
CO2: 17 mmol/L — ABNORMAL LOW (ref 22–32)
Calcium: 9 mg/dL (ref 8.9–10.3)
Chloride: 98 mmol/L (ref 98–111)
Creatinine: 0.5 mg/dL (ref 0.44–1.00)
GFR, Estimated: >60 mL/min (ref >60)
Glucose, Bld: 189 mg/dL — ABNORMAL HIGH (ref 70–99)
Potassium: 4 mmol/L (ref 3.5–5.1)
Sodium: 132 mmol/L — ABNORMAL LOW (ref 135–145)
Total Bilirubin: 0.3 mg/dL (ref 0.0–1.2)
Total Protein: 7.8 g/dL (ref 6.5–8.1)

## 2024-10-29 MED ORDER — ACETAMINOPHEN 325 MG PO TABS
650.0000 mg | ORAL_TABLET | Freq: Once | ORAL | Status: AC
  Administered 2024-10-29: 650 mg via ORAL
  Filled 2024-10-29: qty 2

## 2024-10-29 MED ORDER — NYSTATIN 100000 UNIT/ML MT SUSP: 5.0000 mL | Freq: Four times a day (QID) | OROMUCOSAL | 0 refills | Status: DC

## 2024-10-29 NOTE — Progress Notes (Signed)
 Patient presented to infusion suite today for D8C1 Gemzar. As patient was sitting down, she started to complain of chest burning 7/10, radiating towards her back. Patient states she did not have anything to eat prior appointment today, also c/o increased SOB outside of her normal. Patient states she was due for her home Tramadol dose at 3 PM and did not have it with her. This RN spoke with both Hope, [place] and [place], [place]. EKG ordered by Ono, PA. NT Salmah and Katelyn obtained EKG.  Patient then c/o pain all over, 8/10. Requested Tylenol  PO, ordered by Cassie,PA. See MAR for medication administration.   Patient states she would not like to get chemotherapy today, okay to postpone per Cassie. Rescheduled chemo for Friday at [facility], pt aware and confirms appt. Patient and caregiver given instructions on when to present to ED, voiced understanding. VSS at discharge, pt discharged via wheelchair.

## 2024-10-29 NOTE — Progress Notes (Deleted)
 Cardiology Office Note:    Date:  10/29/2024   ID:  Lindsey Williamson, DOB [birth year], MRN [id number]  PCP:  [name], MD   [facility] Providers Cardiologist:  Maude Emmer, MD { Click to update primary MD,subspecialty MD or APP then REFRESH:1}    Referring MD: [name], MD   No chief complaint on file. ***  History of Present Illness:    Lindsey Williamson is a 73 y.o. female with a hx of NSCLC stage 3, COPD, ILD, chronic respiratory failure on home O2, nonobstructive CAD, HTN, DM2, and ectopic atrial tachycardia.   LHC 04/2023 with mild nonobstructive CAD. Echo 01/2024 with LVEF 65-70%, no RWMA, mild LVH, and normal RV size and function. No significant valvular disease.   She was recently hospitalized with worsening SOB found to be tachycardic. Tachycardia felt related to ectopic atrial tachycardia likely with deterioration in pulmonary status. Continued on bisoprolol  and diltiazem .  Heart monitor demonstrated not yet completed.  She presents for cardiology follow up.     Ectopic atrial tachycardia - felt related to pulmonary status - tolerating bisoprolol  and cardizem    Hypertension - continue 10 mg bisoprolol  and 360 mg cardizem    Nonobstructive CAD - no chest pain - continue ASA    Past Medical History:  Diagnosis Date   Angioedema 10/17/2023   Arthritis    Diabetes mellitus without complication (HCC)    Hypertension     Past Surgical History:  Procedure Laterality Date   BRONCHIAL BIOPSY  03/30/2022   Procedure: BRONCHIAL BIOPSIES;  Surgeon: Brenna Adine CROME, DO;  Location: [facility] ENDOSCOPY;  Service: Pulmonary;;   BRONCHIAL BIOPSY  10/17/2023   Procedure: BRONCHIAL BIOPSIES;  Surgeon: Shelah Lamar RAMAN, MD;  Location: [facility] ENDOSCOPY;  Service: Pulmonary;;   BRONCHIAL BRUSHINGS  03/30/2022   Procedure: BRONCHIAL BRUSHINGS;  Surgeon: Brenna Adine CROME, DO;  Location: [facility] ENDOSCOPY;  Service: Pulmonary;;   BRONCHIAL BRUSHINGS  10/17/2023    Procedure: BRONCHIAL BRUSHINGS;  Surgeon: Shelah Lamar RAMAN, MD;  Location: [facility] ENDOSCOPY;  Service: Pulmonary;;   BRONCHIAL NEEDLE ASPIRATION BIOPSY  03/30/2022   Procedure: BRONCHIAL NEEDLE ASPIRATION BIOPSIES;  Surgeon: Brenna Adine CROME, DO;  Location: [facility] ENDOSCOPY;  Service: Pulmonary;;   BRONCHIAL NEEDLE ASPIRATION BIOPSY  10/17/2023   Procedure: BRONCHIAL NEEDLE ASPIRATION BIOPSIES;  Surgeon: Shelah Lamar RAMAN, MD;  Location: [facility] ENDOSCOPY;  Service: Pulmonary;;   BRONCHIAL WASHINGS  05/19/2024   Procedure: IRRIGATION, BRONCHUS;  Surgeon: Jude Harden GAILS, MD;  Location: [facility] ENDOSCOPY;  Service: Cardiopulmonary;;   EYE SURGERY     FIDUCIAL MARKER PLACEMENT  03/30/2022   Procedure: FIDUCIAL MARKER PLACEMENT;  Surgeon: Brenna Adine CROME, DO;  Location: [facility] ENDOSCOPY;  Service: Pulmonary;;   FINE NEEDLE ASPIRATION  10/17/2023   Procedure: FINE NEEDLE ASPIRATION;  Surgeon: Shelah Lamar RAMAN, MD;  Location: [facility] ENDOSCOPY;  Service: Pulmonary;;   FOREIGN BODY REMOVAL  03/30/2022   Procedure: FOREIGN BODY REMOVAL;  Surgeon: Brenna Adine CROME, DO;  Location: [facility] ENDOSCOPY;  Service: Pulmonary;;   HEMOSTASIS CONTROL  10/17/2023   Procedure: HEMOSTASIS CONTROL;  Surgeon: Shelah Lamar RAMAN, MD;  Location: [facility] ENDOSCOPY;  Service: Pulmonary;;   IR THORACENTESIS ASP PLEURAL SPACE W/IMG GUIDE  08/21/2024   IR THORACENTESIS ASP PLEURAL SPACE W/IMG GUIDE  08/30/2024   IR THORACENTESIS ASP PLEURAL SPACE W/IMG GUIDE  09/25/2024   VIDEO BRONCHOSCOPY Bilateral 05/19/2024   Procedure: VIDEO BRONCHOSCOPY WITHOUT FLUORO;  Surgeon: Jude Harden GAILS, MD;  Location: [facility] ENDOSCOPY;  Service:  Cardiopulmonary;  Laterality: Bilateral;   VIDEO BRONCHOSCOPY WITH ENDOBRONCHIAL ULTRASOUND Bilateral 03/30/2022   Procedure: VIDEO BRONCHOSCOPY WITH ENDOBRONCHIAL ULTRASOUND;  Surgeon: Brenna Adine CROME, DO;  Location: [facility] ENDOSCOPY;  Service: Pulmonary;  Laterality: Bilateral;   VIDEO BRONCHOSCOPY WITH ENDOBRONCHIAL ULTRASOUND Right 10/17/2023   Procedure:  VIDEO BRONCHOSCOPY WITH ENDOBRONCHIAL ULTRASOUND;  Surgeon: Shelah Lamar RAMAN, MD;  Location: [facility] ENDOSCOPY;  Service: Pulmonary;  Laterality: Right;   VIDEO BRONCHOSCOPY WITH RADIAL ENDOBRONCHIAL ULTRASOUND  03/30/2022   Procedure: VIDEO BRONCHOSCOPY WITH RADIAL ENDOBRONCHIAL ULTRASOUND;  Surgeon: Brenna Adine CROME, DO;  Location: [facility] ENDOSCOPY;  Service: Pulmonary;;    Current Medications: No outpatient medications have been marked as taking for the 10/31/24 encounter (Appointment) with Madie Jon Garre, PA.     Allergies:   Benzonatate, Lisinopril, Aspirin , Azithromycin , and Codeine    Social History   Socioeconomic History   Marital status: Married    Spouse name: Not on file   Number of children: Not on file   Years of education: Not on file   Highest education level: Not on file  Occupational History   Not on file  Tobacco Use   Smoking status: Former    Current packs/day: 0.00    Types: Cigarettes    Quit date: 2024    Years since quitting: 1.8    Passive exposure: Past   Smokeless tobacco: Never  Vaping Use   Vaping status: Never Used  Substance and Sexual Activity   Alcohol use: No   Drug use: Never   Sexual activity: Not Currently  Other Topics Concern   Not on file  Social History Narrative   Not on file   Social Drivers of Health   Financial Resource Strain: Low Risk  (04/24/2024)   Received from [facility]   Overall Financial Resource Strain (CARDIA)    Difficulty of Paying Living Expenses: Not very hard  Food Insecurity: No Food Insecurity (09/10/2024)   Hunger Vital Sign    Worried About Running Out of Food in the Last Year: Never true    Ran Out of Food in the Last Year: Never true  Transportation Needs: No Transportation Needs (09/10/2024)   PRAPARE - Administrator, Civil Service (Medical): No    Lack of Transportation (Non-Medical): No  Physical Activity: Inactive (04/24/2024)   Received from [facility]   Exercise Vital Sign    On  average, how many days per week do you engage in moderate to strenuous exercise (like a brisk walk)?: 0 days    On average, how many minutes do you engage in exercise at this level?: 20 min  Stress: Stress Concern Present (04/24/2024)   Received from [facility] Occup[facility] - Occupational Stress Questionnaire    Feeling of Stress : Rather much  Social Connections: Moderately Integrated (09/10/2024)   Social Connection and Isolation Panel    Frequency of Communication with Friends and Family: More than three times a week    Frequency of Social Gatherings with Friends and Family: Never    Attends Religious Services: Never    Database Administrator or Organizations: Yes    Attends Banker Meetings: Never    Marital Status: Married     Family History: The patient's ***family history includes Allergic rhinitis in her sister; Asthma in her sister. There is no history of Angioedema, Atopy, Immunodeficiency, Urticaria, or Eczema.  ROS:   Please see the history of present illness.    ***  All other systems reviewed and are negative.  EKGs/Labs/Other Studies Reviewed:    The following studies were reviewed today: ***      Recent Labs: 04/10/2024: B Natriuretic Peptide 90.9 09/10/2024: Pro Brain Natriuretic Peptide 467.0 09/12/2024: Magnesium  1.4 09/24/2024: TSH 1.350 10/23/2024: ALT <5; BUN 6; Creatinine 0.41; Hemoglobin 9.2; Platelet Count 595; Potassium 3.2; Sodium 133  Recent Lipid Panel    Component Value Date/Time   CHOL 117 04/07/2024 0436   TRIG 54 04/07/2024 0436   HDL 46 04/07/2024 0436   CHOLHDL 2.5 04/07/2024 0436   VLDL 11 04/07/2024 0436   LDLCALC 60 04/07/2024 0436     Risk Assessment/Calculations:   {Does this patient have ATRIAL FIBRILLATION?:[phone]}  No BP recorded.  {Refresh Note OR Click here to enter BP  :1}***         Physical Exam:    VS:  There were no vitals taken for this visit.    Wt Readings from  Last 3 Encounters:  10/23/24 136 lb 8 oz (61.9 kg)  10/15/24 136 lb 8 oz (61.9 kg)  10/05/24 137 lb (62.1 kg)     GEN: *** Well nourished, well developed in no acute distress HEENT: Normal NECK: No JVD; No carotid bruits LYMPHATICS: No lymphadenopathy CARDIAC: ***RRR, no murmurs, rubs, gallops RESPIRATORY:  Clear to auscultation without rales, wheezing or rhonchi  ABDOMEN: Soft, non-tender, non-distended MUSCULOSKELETAL:  No edema; No deformity  SKIN: Warm and dry NEUROLOGIC:  Alert and oriented x 3 PSYCHIATRIC:  Normal affect   ASSESSMENT:    No diagnosis found. PLAN:    In order of problems listed above:  ***      {Are you ordering a CV Procedure (e.g. stress test, cath, DCCV, TEE, etc)?   Press F2        :[id number]}    Medication Adjustments/Labs and Tests Ordered: Current medicines are reviewed at length with the patient today.  Concerns regarding medicines are outlined above.  No orders of the defined types were placed in this encounter.  No orders of the defined types were placed in this encounter.   There are no Patient Instructions on file for this visit.   Signed, Jon Nat Hails, PA  10/29/2024 1:35 PM    [facility]

## 2024-10-30 ENCOUNTER — Encounter: Payer: Self-pay | Admitting: Pulmonary Disease

## 2024-10-30 ENCOUNTER — Other Ambulatory Visit: Payer: Self-pay | Admitting: Pulmonary Disease

## 2024-10-30 ENCOUNTER — Encounter: Payer: Self-pay | Admitting: Internal Medicine

## 2024-10-30 ENCOUNTER — Telehealth: Payer: Self-pay

## 2024-10-30 MED ORDER — HYDROCODONE BIT-HOMATROP MBR 5-1.5 MG/5ML PO SOLN: 5.0000 mL | Freq: Four times a day (QID) | ORAL | 0 refills | Status: DC | PRN

## 2024-10-30 NOTE — Telephone Encounter (Signed)
 Spoke with the patient in regards to rescheduling her infusion appt to tomorrow, Wednesday 10/31/24 at 215 PM.  She confirmed appt.  She voiced thanks.

## 2024-10-31 ENCOUNTER — Inpatient Hospital Stay

## 2024-10-31 ENCOUNTER — Telehealth: Payer: Self-pay | Admitting: Physician Assistant

## 2024-10-31 ENCOUNTER — Ambulatory Visit: Attending: Cardiology | Admitting: Physician Assistant

## 2024-10-31 ENCOUNTER — Encounter: Payer: Self-pay | Admitting: Internal Medicine

## 2024-10-31 ENCOUNTER — Other Ambulatory Visit: Payer: Self-pay | Admitting: Physician Assistant

## 2024-10-31 VITALS — BP 127/65 | HR 100 | Temp 98.5°F | Resp 18

## 2024-10-31 DIAGNOSIS — C3431 Malignant neoplasm of lower lobe, right bronchus or lung: Secondary | ICD-10-CM

## 2024-10-31 DIAGNOSIS — Z5111 Encounter for antineoplastic chemotherapy: Secondary | ICD-10-CM | POA: Diagnosis not present

## 2024-10-31 DIAGNOSIS — J90 Pleural effusion, not elsewhere classified: Secondary | ICD-10-CM

## 2024-10-31 MED ORDER — SODIUM CHLORIDE 0.9 % IV SOLN
800.0000 mg/m2 | Freq: Once | INTRAVENOUS | Status: AC
  Administered 2024-10-31: 1330 mg via INTRAVENOUS
  Filled 2024-10-31: qty 34.98

## 2024-10-31 MED ORDER — SODIUM CHLORIDE 0.9 % IV SOLN: INTRAVENOUS | Status: DC

## 2024-10-31 MED ORDER — PROCHLORPERAZINE MALEATE 10 MG PO TABS
10.0000 mg | ORAL_TABLET | Freq: Once | ORAL | Status: AC
  Administered 2024-10-31: 10 mg via ORAL
  Filled 2024-10-31: qty 1

## 2024-10-31 NOTE — Telephone Encounter (Signed)
 The results of the patient's chest x-ray resulted which showed a large left effusion.  She had previously seen pulmonary medicine and was not interested in a PleurX catheter due to concerns with being able to manage it at home.  I have placed a stat order for a thoracentesis.  I attempted to call the patient and her spouse to review this with them.  Unable to reach them.  We will try to call them first thing in the morning, alternatively, I also requested that they give us  a callback as well once they receive the message.

## 2024-10-31 NOTE — Patient Instructions (Signed)
 [facility] - A DEPT OF [facility]  Discharge Instructions: Thank you for choosing [facility] to provide your oncology and hematology care.   If you have a lab appointment with the Cancer Center, please go directly to the Cancer Center and check in at the registration area.   Wear comfortable clothing and clothing appropriate for easy access to any Portacath or PICC line.   We strive to give you quality time with your provider. You may need to reschedule your appointment if you arrive late (15 or more minutes).  Arriving late affects you and other patients whose appointments are after yours.  Also, if you miss three or more appointments without notifying the office, you may be dismissed from the clinic at the provider's discretion.      For prescription refill requests, have your pharmacy contact our office and allow 72 hours for refills to be completed.    Today you received the following chemotherapy and/or immunotherapy agents: Gemcitabine.       To help prevent nausea and vomiting after your treatment, we encourage you to take your nausea medication as directed.  BELOW ARE SYMPTOMS THAT SHOULD BE REPORTED IMMEDIATELY: *FEVER GREATER THAN 100.4 F (38 C) OR HIGHER *CHILLS OR SWEATING *NAUSEA AND VOMITING THAT IS NOT CONTROLLED WITH YOUR NAUSEA MEDICATION *UNUSUAL SHORTNESS OF BREATH *UNUSUAL BRUISING OR BLEEDING *URINARY PROBLEMS (pain or burning when urinating, or frequent urination) *BOWEL PROBLEMS (unusual diarrhea, constipation, pain near the anus) TENDERNESS IN MOUTH AND THROAT WITH OR WITHOUT PRESENCE OF ULCERS (sore throat, sores in mouth, or a toothache) UNUSUAL RASH, SWELLING OR PAIN  UNUSUAL VAGINAL DISCHARGE OR ITCHING   Items with * indicate a potential emergency and should be followed up as soon as possible or go to the Emergency Department if any problems should occur.  Please show the CHEMOTHERAPY ALERT CARD or  IMMUNOTHERAPY ALERT CARD at check-in to the Emergency Department and triage nurse.  Should you have questions after your visit or need to cancel or reschedule your appointment, please contact [facility] - A DEPT OF [facility]  Dept: [phone]  and follow the prompts.  Office hours are 8:00 a.m. to 4:30 p.m. Monday - Friday. Please note that voicemails left after 4:00 p.m. may not be returned until the following business day.  We are closed weekends and major holidays. You have access to a nurse at all times for urgent questions. Please call the main number to the clinic Dept: [phone] and follow the prompts.   For any non-urgent questions, you may also contact your provider using MyChart. We now offer e-Visits for anyone 19 and older to request care online for non-urgent symptoms. For details visit [email or web address].   Also download the MyChart app! Go to the app store, search "MyChart", open the app, select [facility], and log in with your MyChart username and password.

## 2024-11-01 ENCOUNTER — Telehealth: Payer: Self-pay

## 2024-11-01 ENCOUNTER — Other Ambulatory Visit: Payer: Self-pay

## 2024-11-01 DIAGNOSIS — C3431 Malignant neoplasm of lower lobe, right bronchus or lung: Secondary | ICD-10-CM

## 2024-11-01 DIAGNOSIS — Z515 Encounter for palliative care: Secondary | ICD-10-CM

## 2024-11-01 DIAGNOSIS — G893 Neoplasm related pain (acute) (chronic): Secondary | ICD-10-CM

## 2024-11-01 MED ORDER — TRAMADOL HCL 50 MG PO TABS: 50.0000 mg | ORAL_TABLET | Freq: Four times a day (QID) | ORAL | 0 refills | Status: DC | PRN

## 2024-11-01 NOTE — Telephone Encounter (Signed)
 Spoke with the patient regarding X-ray results. Per Cassie, PA, patient has a large right pleural effusion and requires a thoracentesis. Patient was informed of X-ray findings and her appointment for the thoracentesis at [facility] tomorrow at 2 PM, with a 1:30 PM arrival time. Instructed patient to enter through Entrance A, check in at the desk, and staff will direct her to the appropriate area. Advised patient to call if her breathing worsens or if she experiences any difficulties. Patient verbalized understanding of the instructions provided.  Patient also inquired about a refill on Tramadol.  Informed her I would send request to palliative care to review.

## 2024-11-02 ENCOUNTER — Ambulatory Visit (HOSPITAL_COMMUNITY)
Admission: RE | Admit: 2024-11-02 | Discharge: 2024-11-02 | Disposition: A | Discharge status: Home / Self Care | Source: Ambulatory Visit

## 2024-11-02 ENCOUNTER — Inpatient Hospital Stay

## 2024-11-02 ENCOUNTER — Ambulatory Visit (HOSPITAL_COMMUNITY)
Admission: RE | Admit: 2024-11-02 | Discharge: 2024-11-02 | Disposition: A | Discharge status: Home / Self Care | Source: Ambulatory Visit | Attending: Physician Assistant | Admitting: Physician Assistant

## 2024-11-02 DIAGNOSIS — J90 Pleural effusion, not elsewhere classified: Secondary | ICD-10-CM | POA: Diagnosis present

## 2024-11-02 DIAGNOSIS — C3431 Malignant neoplasm of lower lobe, right bronchus or lung: Secondary | ICD-10-CM | POA: Insufficient documentation

## 2024-11-02 MED ORDER — LIDOCAINE-EPINEPHRINE 1 %-1:100000 IJ SOLN
20.0000 mL | Freq: Once | INTRAMUSCULAR | Status: AC
  Administered 2024-11-02: 10 mL

## 2024-11-02 MED ORDER — TRAMADOL HCL 50 MG PO TABS
50.0000 mg | ORAL_TABLET | Freq: Once | ORAL | Status: AC
  Administered 2024-11-02: 50 mg via ORAL
  Filled 2024-11-02: qty 1

## 2024-11-02 MED ORDER — LIDOCAINE-EPINEPHRINE 1 %-1:100000 IJ SOLN
INTRAMUSCULAR | Status: AC
  Filled 2024-11-02: qty 1

## 2024-11-02 NOTE — Procedures (Signed)
 PROCEDURE SUMMARY:  Successful image-guided diagnostic and therapeutic thoracentesis from the right chest.  Yielded 650 milliliters of yellow fluid.  No immediate complications.  EBL: zero Patient tolerated well.   Specimen not sent for labs.  Post-procedure CXR ordered and reviewed prior to departure from department.   Please see imaging section of Epic for full dictation.  Regina Ganci NP 11/02/2024 3:11 PM

## 2024-11-02 NOTE — Progress Notes (Signed)
 Patient complaining of shortness of breath after thoracentesis. NP Huneycutt and PA Craim notified and came to bedside. MD Henn aware, reviewed chest xray.  Patient's VSS: BP 85/69, O2 96% on 3L (baseline from home).  Patient monitored for 30 minutes. Pt state work of breathing feels better. VSS: BP 103/71, O2 100% on 3L

## 2024-11-02 NOTE — Progress Notes (Signed)
  IR BRIEF PROGRESS NOTE:  Patient experienced chest discomfort and mild shortness of breath s/p thoracentesis, which is common for her after thoracentesis, and an expected finding. She was brought back to an IR bay, and re-evaluated. She was maintained on 3 L O2 by Rhodes. CXR s/p thoracentesis was reviewed by IR attending, Dr. Philip, who noted no concern for pneumothorax.   Patient's chest discomfort did gradually resolve over the course of 30-45 min, as expected. She did state she was experiencing generalized pain from chemotherapy and advanced lung cancer, and that it had been greater than 6 hours since her last home-prescribed Tramadol 50 mg. IR provided patient with a dose of her Tramadol. Patient did endorse feeling improved after tramadol dose was given.  Patient's systolic and diastolic blood pressures did decrease by 10 points from baseline initially. She was placed on a stretcher. Her blood pressure normalized prior to discharge. Map >75.   Patient was discharged with precautions given to present to ED with worsening of shortness of breath or chest pain. Patient and her family member both voiced understanding and are in agreement with this plan.   Electronically Signed: Carlin DELENA Griffon, PA-C 11/02/2024, 4:59 PM

## 2024-11-05 ENCOUNTER — Other Ambulatory Visit

## 2024-11-05 ENCOUNTER — Ambulatory Visit: Admitting: Internal Medicine

## 2024-11-05 ENCOUNTER — Encounter: Payer: Self-pay | Admitting: General Practice

## 2024-11-05 ENCOUNTER — Inpatient Hospital Stay

## 2024-11-05 ENCOUNTER — Ambulatory Visit

## 2024-11-05 ENCOUNTER — Inpatient Hospital Stay: Admitting: Nurse Practitioner

## 2024-11-05 NOTE — Progress Notes (Signed)
 Nutrition  Patient did not show up for scheduled nutrition visit today.   Jerel Sardina B. Dasie SOLON, CSO, LDN Registered Dietitian [phone]

## 2024-11-05 NOTE — Progress Notes (Signed)
 [facility] Spiritual Care Note  Attempted call per referral from Cassie Heilingoetter/PA, but voicemail full. Will continue to try and also plan to see Lindsey Williamson at next infusion.  [address] Lindsey Williamson, [place], [facility] Pager [phone] Voicemail [phone]

## 2024-11-09 ENCOUNTER — Emergency Department (HOSPITAL_COMMUNITY)

## 2024-11-09 ENCOUNTER — Encounter (HOSPITAL_COMMUNITY): Payer: Self-pay

## 2024-11-09 ENCOUNTER — Inpatient Hospital Stay (HOSPITAL_COMMUNITY)
Admission: EM | Admit: 2024-11-09 | Discharge: 2024-11-26 | DRG: 951 | Disposition: E | Attending: Emergency Medicine | Admitting: Emergency Medicine

## 2024-11-09 ENCOUNTER — Other Ambulatory Visit: Payer: Self-pay

## 2024-11-09 DIAGNOSIS — Z7951 Long term (current) use of inhaled steroids: Secondary | ICD-10-CM

## 2024-11-09 DIAGNOSIS — Z885 Allergy status to narcotic agent status: Secondary | ICD-10-CM | POA: Diagnosis not present

## 2024-11-09 DIAGNOSIS — J9601 Acute respiratory failure with hypoxia: Principal | ICD-10-CM | POA: Diagnosis present

## 2024-11-09 DIAGNOSIS — Z923 Personal history of irradiation: Secondary | ICD-10-CM

## 2024-11-09 DIAGNOSIS — C3431 Malignant neoplasm of lower lobe, right bronchus or lung: Secondary | ICD-10-CM | POA: Diagnosis present

## 2024-11-09 DIAGNOSIS — Z7984 Long term (current) use of oral hypoglycemic drugs: Secondary | ICD-10-CM | POA: Diagnosis not present

## 2024-11-09 DIAGNOSIS — J449 Chronic obstructive pulmonary disease, unspecified: Secondary | ICD-10-CM | POA: Diagnosis present

## 2024-11-09 DIAGNOSIS — R809 Proteinuria, unspecified: Secondary | ICD-10-CM | POA: Diagnosis not present

## 2024-11-09 DIAGNOSIS — Z79899 Other long term (current) drug therapy: Secondary | ICD-10-CM | POA: Diagnosis not present

## 2024-11-09 DIAGNOSIS — R627 Adult failure to thrive: Secondary | ICD-10-CM | POA: Diagnosis present

## 2024-11-09 DIAGNOSIS — R531 Weakness: Secondary | ICD-10-CM

## 2024-11-09 DIAGNOSIS — Z515 Encounter for palliative care: Principal | ICD-10-CM

## 2024-11-09 DIAGNOSIS — Z888 Allergy status to other drugs, medicaments and biological substances status: Secondary | ICD-10-CM

## 2024-11-09 DIAGNOSIS — Z881 Allergy status to other antibiotic agents status: Secondary | ICD-10-CM | POA: Diagnosis not present

## 2024-11-09 DIAGNOSIS — Z87891 Personal history of nicotine dependence: Secondary | ICD-10-CM | POA: Diagnosis not present

## 2024-11-09 DIAGNOSIS — E1129 Type 2 diabetes mellitus with other diabetic kidney complication: Secondary | ICD-10-CM | POA: Diagnosis present

## 2024-11-09 DIAGNOSIS — Z886 Allergy status to analgesic agent status: Secondary | ICD-10-CM

## 2024-11-09 DIAGNOSIS — Z7189 Other specified counseling: Secondary | ICD-10-CM

## 2024-11-09 DIAGNOSIS — J90 Pleural effusion, not elsewhere classified: Secondary | ICD-10-CM | POA: Diagnosis present

## 2024-11-09 DIAGNOSIS — D638 Anemia in other chronic diseases classified elsewhere: Secondary | ICD-10-CM | POA: Diagnosis present

## 2024-11-09 DIAGNOSIS — J910 Malignant pleural effusion: Secondary | ICD-10-CM | POA: Diagnosis present

## 2024-11-09 DIAGNOSIS — Z794 Long term (current) use of insulin: Secondary | ICD-10-CM | POA: Diagnosis not present

## 2024-11-09 DIAGNOSIS — E1169 Type 2 diabetes mellitus with other specified complication: Secondary | ICD-10-CM | POA: Diagnosis present

## 2024-11-09 DIAGNOSIS — I10 Essential (primary) hypertension: Secondary | ICD-10-CM | POA: Diagnosis present

## 2024-11-09 DIAGNOSIS — Z66 Do not resuscitate: Secondary | ICD-10-CM | POA: Diagnosis present

## 2024-11-09 DIAGNOSIS — Z9221 Personal history of antineoplastic chemotherapy: Secondary | ICD-10-CM

## 2024-11-09 DIAGNOSIS — Z825 Family history of asthma and other chronic lower respiratory diseases: Secondary | ICD-10-CM

## 2024-11-09 DIAGNOSIS — Z7982 Long term (current) use of aspirin: Secondary | ICD-10-CM | POA: Diagnosis not present

## 2024-11-09 DIAGNOSIS — J9621 Acute and chronic respiratory failure with hypoxia: Secondary | ICD-10-CM | POA: Diagnosis present

## 2024-11-09 LAB — CBG MONITORING, ED: Glucose-Capillary: 301 mg/dL — ABNORMAL HIGH (ref 70–99)

## 2024-11-09 MED ORDER — LORAZEPAM 2 MG/ML IJ SOLN: 0.5000 mg | INTRAMUSCULAR | Status: DC | PRN

## 2024-11-09 MED ORDER — HYDROMORPHONE HCL-NACL 50-0.9 MG/50ML-% IV SOLN
0.5000 mg/h | INTRAVENOUS | Status: DC
  Administered 2024-11-09: 0.5 mg/h via INTRAVENOUS
  Filled 2024-11-09: qty 50

## 2024-11-09 MED ORDER — INSULIN ASPART 100 UNIT/ML IJ SOLN
0.0000 [IU] | Freq: Three times a day (TID) | INTRAMUSCULAR | Status: DC
  Administered 2024-11-09: 4 [IU] via SUBCUTANEOUS
  Filled 2024-11-09: qty 4

## 2024-11-09 MED ORDER — FENTANYL CITRATE (PF) 50 MCG/ML IJ SOSY: 50.0000 ug | PREFILLED_SYRINGE | INTRAMUSCULAR | Status: DC | PRN

## 2024-11-09 MED ORDER — DILTIAZEM HCL ER COATED BEADS 240 MG PO CP24
360.0000 mg | ORAL_CAPSULE | Freq: Every day | ORAL | Status: DC
  Administered 2024-11-09: 360 mg via ORAL
  Filled 2024-11-09 (×2): qty 3

## 2024-11-09 MED ORDER — ACETAMINOPHEN 325 MG PO TABS: 650.0000 mg | ORAL_TABLET | Freq: Four times a day (QID) | ORAL | Status: DC | PRN

## 2024-11-09 MED ORDER — HYDROMORPHONE HCL 1 MG/ML IJ SOLN: 1.0000 mg | INTRAMUSCULAR | Status: DC | PRN

## 2024-11-09 MED ORDER — UMECLIDINIUM-VILANTEROL 62.5-25 MCG/ACT IN AEPB
1.0000 | INHALATION_SPRAY | Freq: Every day | RESPIRATORY_TRACT | Status: DC
  Administered 2024-11-09: 1 via RESPIRATORY_TRACT
  Filled 2024-11-09: qty 14

## 2024-11-09 MED ORDER — BISOPROLOL FUMARATE 5 MG PO TABS
5.0000 mg | ORAL_TABLET | Freq: Every day | ORAL | Status: DC
  Filled 2024-11-09: qty 1

## 2024-11-09 MED ORDER — ONDANSETRON HCL 4 MG/2ML IJ SOLN
4.0000 mg | Freq: Four times a day (QID) | INTRAMUSCULAR | Status: DC | PRN
  Administered 2024-11-09: 4 mg via INTRAVENOUS
  Filled 2024-11-09: qty 2

## 2024-11-09 MED ORDER — FENTANYL CITRATE (PF) 50 MCG/ML IJ SOSY
50.0000 ug | PREFILLED_SYRINGE | INTRAMUSCULAR | Status: DC | PRN
Start: 2024-11-09 — End: 2024-11-09
  Administered 2024-11-09 (×2): 50 ug via INTRAVENOUS
  Filled 2024-11-09 (×2): qty 1

## 2024-11-09 MED ORDER — FENTANYL CITRATE (PF) 50 MCG/ML IJ SOSY
50.0000 ug | PREFILLED_SYRINGE | Freq: Once | INTRAMUSCULAR | Status: AC
  Administered 2024-11-09: 50 ug via INTRAVENOUS
  Filled 2024-11-09: qty 1

## 2024-11-09 MED ORDER — ACETAMINOPHEN 650 MG RE SUPP: 650.0000 mg | Freq: Four times a day (QID) | RECTAL | Status: DC | PRN

## 2024-11-09 NOTE — H&P (Signed)
 History and Physical    [name] FMW:[id number] DOB: [birth year] DOA: 11/09/2024  Patient coming from: Home.  Chief Complaint: Shortness of breath.  HPI: Lindsey Williamson is a 73 y.o. female with history of recurrent non-small cell lung cancer with progression and malignant right pleural effusion who had recent thoracentesis about a week ago on November 02, 2024 and also on chemo and immunotherapy presents to the ER with worsening shortness of breath.  As per the patient and her husband since her thoracentesis last week she has progressively got more short of breath unable to ambulate because of the shortness of breath and is on 10 L oxygen.  Denies any fever chills nausea vomiting or diarrhea.  ED Course: In the ER chest x-ray shows large right-sided pleural effusion.  Patient at this time has clearly stated that she does not want any aggressive measures and she would like to be comfort measures only.  Patient stated this to the ER physician Dr. Theadore and also to me.  Patient at the time of discussion is alert awake oriented time place and person.  Patient's husband also was involved in the discussion.  Patient at this time is admitted for comfort measures.  But would like palliative team to be involved for final decision.  Patient would like no labs to be done.  Review of Systems: As per HPI, rest all negative.   Past Medical History:  Diagnosis Date   Angioedema 10/17/2023   Arthritis    Diabetes mellitus without complication (HCC)    Hypertension     Past Surgical History:  Procedure Laterality Date   BRONCHIAL BIOPSY  03/30/2022   Procedure: BRONCHIAL BIOPSIES;  Surgeon: Brenna Adine CROME, DO;  Location: [facility] ENDOSCOPY;  Service: Pulmonary;;   BRONCHIAL BIOPSY  10/17/2023   Procedure: BRONCHIAL BIOPSIES;  Surgeon: Shelah Lamar RAMAN, MD;  Location: [facility] ENDOSCOPY;  Service: Pulmonary;;   BRONCHIAL BRUSHINGS  03/30/2022   Procedure: BRONCHIAL BRUSHINGS;  Surgeon: Brenna Adine CROME, DO;   Location: [facility] ENDOSCOPY;  Service: Pulmonary;;   BRONCHIAL BRUSHINGS  10/17/2023   Procedure: BRONCHIAL BRUSHINGS;  Surgeon: Shelah Lamar RAMAN, MD;  Location: [facility] ENDOSCOPY;  Service: Pulmonary;;   BRONCHIAL NEEDLE ASPIRATION BIOPSY  03/30/2022   Procedure: BRONCHIAL NEEDLE ASPIRATION BIOPSIES;  Surgeon: Brenna Adine CROME, DO;  Location: [facility] ENDOSCOPY;  Service: Pulmonary;;   BRONCHIAL NEEDLE ASPIRATION BIOPSY  10/17/2023   Procedure: BRONCHIAL NEEDLE ASPIRATION BIOPSIES;  Surgeon: Shelah Lamar RAMAN, MD;  Location: [facility]OSCOPY;  Service: Pulmonary;;   BRONCHIAL WASHINGS  05/19/2024   Procedure: IRRIGATION, BRONCHUS;  Surgeon: Jude Harden GAILS, MD;  Location: [facility] ENDOSCOPY;  Service: Cardiopulmonary;;   EYE SURGERY     FIDUCIAL MARKER PLACEMENT  03/30/2022   Procedure: FIDUCIAL MARKER PLACEMENT;  Surgeon: Brenna Adine CROME, DO;  Location: [facility]OSCOPY;  Service: Pulmonary;;   FINE NEEDLE ASPIRATION  10/17/2023   Procedure: FINE NEEDLE ASPIRATION;  Surgeon: Shelah Lamar RAMAN, MD;  Location: [facility] ENDOSCOPY;  Service: Pulmonary;;   FOREIGN BODY REMOVAL  03/30/2022   Procedure: FOREIGN BODY REMOVAL;  Surgeon: Brenna Adine CROME, DO;  Location: [facility] ENDOSCOPY;  Service: Pulmonary;;   HEMOSTASIS CONTROL  10/17/2023   Procedure: HEMOSTASIS CONTROL;  Surgeon: Shelah Lamar RAMAN, MD;  Location: [facility]OSCOPY;  Service: Pulmonary;;   IR THORACENTESIS ASP PLEURAL SPACE W/IMG GUIDE  08/21/2024   IR THORACENTESIS ASP PLEURAL SPACE W/IMG GUIDE  08/30/2024   IR THORACENTESIS ASP PLEURAL SPACE W/IMG GUIDE  09/25/2024   IR THORACENTESIS ASP  PLEURAL SPACE W/IMG GUIDE  11/02/2024   VIDEO BRONCHOSCOPY Bilateral 05/19/2024   Procedure: VIDEO BRONCHOSCOPY WITHOUT FLUORO;  Surgeon: Jude Harden GAILS, MD;  Location: [facility] ENDOSCOPY;  Service: Cardiopulmonary;  Laterality: Bilateral;   VIDEO BRONCHOSCOPY WITH ENDOBRONCHIAL ULTRASOUND Bilateral 03/30/2022   Procedure: VIDEO BRONCHOSCOPY WITH ENDOBRONCHIAL ULTRASOUND;  Surgeon: Brenna Adine CROME, DO;  Location:  [facility] ENDOSCOPY;  Service: Pulmonary;  Laterality: Bilateral;   VIDEO BRONCHOSCOPY WITH ENDOBRONCHIAL ULTRASOUND Right 10/17/2023   Procedure: VIDEO BRONCHOSCOPY WITH ENDOBRONCHIAL ULTRASOUND;  Surgeon: Shelah Lamar RAMAN, MD;  Location: [facility]COPY;  Service: Pulmonary;  Laterality: Right;   VIDEO BRONCHOSCOPY WITH RADIAL ENDOBRONCHIAL ULTRASOUND  03/30/2022   Procedure: VIDEO BRONCHOSCOPY WITH RADIAL ENDOBRONCHIAL ULTRASOUND;  Surgeon: Brenna Adine CROME, DO;  Location: [facility] ENDOSCOPY;  Service: Pulmonary;;     reports that she quit smoking about 22 months ago. Her smoking use included cigarettes. She has been exposed to tobacco smoke. She has never used smokeless tobacco. She reports that she does not drink alcohol and does not use drugs.  Allergies  Allergen Reactions   Benzonatate Palpitations   Lisinopril Swelling and Other (See Comments)    Patient ended up in the ED with a swollen mouth and tongue   Aspirin  Nausea And Vomiting, Palpitations and Other (See Comments)    Can tolerate baby aspirin  81 mg without difficulty, but can't tolerate higher dosages   Azithromycin  Palpitations   Codeine  Palpitations and Other (See Comments)    Patient states this is not an allergy     Family History  Problem Relation Age of Onset   Allergic rhinitis Sister    Asthma Sister    Angioedema Neg Hx    Atopy Neg Hx    Immunodeficiency Neg Hx    Urticaria Neg Hx    Eczema Neg Hx     Prior to Admission medications   Medication Sig Start Date End Date Taking? Authorizing Provider  albuterol  (VENTOLIN  HFA) 108 (90 Base) MCG/ACT inhaler Inhale 2 puffs into the lungs every 6 (six) hours as needed for wheezing or shortness of breath. 08/07/24  Yes Olalere, Adewale A, MD  bisoprolol  (ZEBETA ) 5 MG tablet Take 5 mg by mouth daily. 10/06/24  Yes [provider]  diltiazem  (CARDIZEM  CD) 360 MG 24 hr capsule Take 1 capsule (360 mg total) by mouth daily. 08/24/24 08/24/25 Yes Patsy Lenis, MD  empagliflozin   (JARDIANCE ) 25 MG TABS tablet Take 25 mg by mouth daily at 6 PM. 02/08/22  Yes [provider]  glipiZIDE  (GLUCOTROL  XL) 5 MG 24 hr tablet Take 1 tablet (5 mg total) by mouth daily with breakfast. 04/11/24  Yes Amin, Ankit C, MD  ipratropium-albuterol  (DUONEB) 0.5-2.5 (3) MG/3ML SOLN Take 3 mLs by nebulization in the morning and at bedtime. 05/14/24  Yes Ruthell Lauraine FALCON, NP  metFORMIN  (GLUCOPHAGE -XR) 500 MG 24 hr tablet Take 500 mg by mouth 2 (two) times daily with a meal. 06/24/20  Yes [provider]  traMADol (ULTRAM) 50 MG tablet Take 1 tablet (50 mg total) by mouth every 6 (six) hours as needed. 11/01/24  Yes Pickenpack-Cousar, Fannie SAILOR, NP  umeclidinium-vilanterol (ANORO ELLIPTA ) 62.5-25 MCG/ACT AEPB Inhale 1 puff into the lungs daily. 05/09/24  Yes Ruthell Lauraine FALCON, NP  aspirin  EC 81 MG tablet Take 81 mg by mouth daily. Swallow whole.    [provider]  bisoprolol  (ZEBETA ) 10 MG tablet Take 1 tablet (10 mg total) by mouth daily. 08/24/24   Patsy Lenis, MD  cetirizine  (ZYRTEC  ALLERGY ) 10  MG tablet Take 1 tablet (10 mg total) by mouth daily. 03/08/24   Lorin Norris, MD  ferrous sulfate  325 (65 FE) MG tablet Take 325 mg by mouth daily with breakfast.    [provider]  guaiFENesin -dextromethorphan  (ROBITUSSIN DM) 100-10 MG/5ML syrup Take 5 mLs by mouth every 4 (four) hours as needed for cough. 10/05/24   Olalere, Jennet LABOR, MD  HYDROcodone  bit-homatropine (HYDROMET) 5-1.5 MG/5ML syrup Take 5 mLs by mouth every 6 (six) hours as needed for cough. 10/30/24   Neda Jennet LABOR, MD  insulin  aspart (NOVOLOG ) 100 UNIT/ML FlexPen Inject 0-11 Units into the skin 3 (three) times daily with meals. Check Blood Glucose (BG) and inject per scale: BG <150= 0 unit; BG 150-200= 1 unit; BG 201-250= 3 unit; BG 251-300= 5 unit; BG 301-350= 7 unit; BG 351-400= 9 unit; BG >400= 11 unit and Call Primary Care. Patient not taking: Reported on 11/09/2024 04/11/24   Caleen Burgess BROCKS, MD   metFORMIN  (GLUCOPHAGE ) 1000 MG tablet Take 1 tablet (1,000 mg total) by mouth 2 (two) times daily with a meal. Patient not taking: Reported on 11/09/2024 09/14/24   Jillian Buttery, MD  nystatin  (MYCOSTATIN ) 100000 UNIT/ML suspension Take 5 mLs (500,000 Units total) by mouth 4 (four) times daily. 10/29/24   Heilingoetter, Cassandra L, PA-C  nystatin  (MYCOSTATIN /NYSTOP ) powder Apply 1 Application topically 3 (three) times daily. 09/24/24   Heilingoetter, Cassandra L, PA-C  polyethylene glycol (MIRALAX  / GLYCOLAX ) 17 g packet Take 17 g packet by mouth daily. 02/19/24   Adhikari, Amrit, MD  potassium chloride  SA (KLOR-CON  M) 20 MEQ tablet Take 1 tablet (20 mEq total) by mouth daily. 10/23/24   Heilingoetter, Cassandra L, PA-C  prochlorperazine  (COMPAZINE ) 10 MG tablet Take 1 tablet (10 mg total) by mouth every 6 (six) hours as needed. Patient taking differently: Take 10 mg by mouth every 6 (six) hours as needed for nausea or vomiting. 07/17/24   Heilingoetter, Cassandra L, PA-C  senna-docusate (SENOKOT-S) 8.6-50 MG tablet Take 1 tablet by mouth 2 (two) times daily. 09/14/24   Jillian Buttery, MD    Physical Exam: Constitutional: Moderately built and nourished. Vitals:   11/09/24 0404  BP: 131/82  Pulse: (!) 141  Resp: 20  Temp: (!) 96.7 F (35.9 C)  TempSrc: Rectal  SpO2: 100%   Eyes: Anicteric no pallor. ENMT: No discharge from the ears eyes nose or mouth. Neck: No mass felt.  No neck rigidity. Respiratory: No rhonchi or crepitations. Cardiovascular: S1-S2 heard.  Tachycardic. Abdomen: Soft nontender bowel sound present. Musculoskeletal: No edema. Skin: No rash. Neurologic: Alert awake oriented to time place and person.  Moving all extremities. Psychiatric: Appears normal.  Normal affect.   Labs on Admission: I have personally reviewed following labs and imaging studies  CBC: No results for input(s): WBC, NEUTROABS, HGB, HCT, MCV, PLT in the last 168 hours. Basic  Metabolic Panel: No results for input(s): NA, K, CL, CO2, GLUCOSE, BUN, CREATININE, CALCIUM , MG, PHOS in the last 168 hours. GFR: Estimated Creatinine Clearance: 53.3 mL/min (by C-G formula based on SCr of 0.5 mg/dL). Liver Function Tests: No results for input(s): AST, ALT, ALKPHOS, BILITOT, PROT, ALBUMIN in the last 168 hours. No results for input(s): LIPASE, AMYLASE in the last 168 hours. No results for input(s): AMMONIA in the last 168 hours. Coagulation Profile: No results for input(s): INR, PROTIME in the last 168 hours. Cardiac Enzymes: No results for input(s): CKTOTAL, CKMB, CKMBINDEX, TROPONINI in the last 168 hours. BNP (last 3 results) Recent  Labs    09/10/24 1550  PROBNP 467.0*   HbA1C: No results for input(s): HGBA1C in the last 72 hours. CBG: No results for input(s): GLUCAP in the last 168 hours. Lipid Profile: No results for input(s): CHOL, HDL, LDLCALC, TRIG, CHOLHDL, LDLDIRECT in the last 72 hours. Thyroid  Function Tests: No results for input(s): TSH, T4TOTAL, FREET4, T3FREE, THYROIDAB in the last 72 hours. Anemia Panel: No results for input(s): VITAMINB12, FOLATE, FERRITIN, TIBC, IRON, RETICCTPCT in the last 72 hours. Urine analysis:    Component Value Date/Time   COLORURINE YELLOW 09/10/2024 2348   APPEARANCEUR CLEAR 09/10/2024 2348   LABSPEC 1.027 09/10/2024 2348   PHURINE 5.0 09/10/2024 2348   GLUCOSEU >=500 (A) 09/10/2024 2348   HGBUR NEGATIVE 09/10/2024 2348   BILIRUBINUR NEGATIVE 09/10/2024 2348   KETONESUR 5 (A) 09/10/2024 2348   PROTEINUR 30 (A) 09/10/2024 2348   NITRITE NEGATIVE 09/10/2024 2348   LEUKOCYTESUR LARGE (A) 09/10/2024 2348   Sepsis Labs: @LABRCNTIP (procalcitonin:4,lacticidven:4) )No results found for this or any previous visit (from the past 240 hours).   Radiological Exams on Admission: DG Chest Port 1 View Result Date: 11/09/2024 EXAM: 1  VIEW(S) XRAY OF THE CHEST 11/09/2024 04:29:00 AM COMPARISON: 11/02/2024 CLINICAL HISTORY: SOB (shortness of breath) FINDINGS: LUNGS AND PLEURA: Large right pleural effusion, increased from prior. Diffuse interstitial opacities. Right basilar airspace opacity. No pneumothorax. HEART AND MEDIASTINUM: Cardiomegaly. Aortic atherosclerosis. BONES AND SOFT TISSUES: No acute osseous abnormality. IMPRESSION: 1. Large right pleural effusion, increased from prior. 2. Diffuse interstitial opacities and right basilar airspace opacity. 3. Cardiomegaly and aortic atherosclerosis. Electronically signed by: Evalene Coho MD 11/09/2024 04:33 AM EST RP Workstation: [id number]     Assessment/Plan Principal Problem:   Acute respiratory failure with hypoxia (HCC) Active Problems:   Primary cancer of right lower lobe of lung (HCC)   Acute on chronic respiratory failure with hypoxia (HCC)   Hypertension   Type 2 diabetes mellitus with microalbuminuria (HCC)   COPD (chronic obstructive pulmonary disease) (HCC)    Acute on chronic respiratory failure with hypoxia secondary to progression of non-small cell lung cancer with malignant effusion.  Patient is requesting to be on comfort measures at this time.  She does not want any aggressive measures and would like palliative team involved in her care at this time.  She does not want any labs  ordered.  Patient's husband also aware of the decision.  Will consult palliative team and also notify patient's oncologist Dr. Sherrod. Recurrent non-small cell lung cancer with malignant pleural effusion on chemo and immunotherapy.  See #1. COPD continue with home inhalers. Diabetes mellitus type 2 last hemoglobin A1c was 9.3 about 2 months ago.  For now we will keep patient on sliding scale coverage.  Home patient is on Jardiance  and metformin  and glipizide . History of atrial tachycardia on bisoprolol  and Cardizem . Chronic anemia.   Since patient has acute on chronic respiratory  failure with hypoxia presently on 10 L oxygen will need further management and more than 2 midnight stay   DVT prophylaxis: SCDs. Code Status: DNR confirmed with patient and patient's husband. Family Communication: Husband. Disposition Plan: Admit to inpatient. Consults called: Alerted team and will also inform patient's oncologist. Admission status: Patient.

## 2024-11-09 NOTE — Plan of Care (Signed)

## 2024-11-09 NOTE — ED Provider Notes (Addendum)
 [facility] Emergency Department Provider Note MRN:  [id number]  Arrival date & time: 11/09/24     Chief Complaint   Shortness of Breath   History of Present Illness   Lindsey Williamson is a 73 y.o. year-old female with a history of stage or lung cancer presenting to the ED with chief complaint of shortness of breath.  Worsening shortness of breath this evening, worsening chest pain as well.  Patient says that she wants to be done.  Wants to be kept comfortable but does not want any more tests or chemo.  Last chemo was on Monday.  Review of Systems  A thorough review of systems was obtained and all systems are negative except as noted in the HPI and PMH.   Patient's Health History    Past Medical History:  Diagnosis Date   Angioedema 10/17/2023   Arthritis    Diabetes mellitus without complication (HCC)    Hypertension     Past Surgical History:  Procedure Laterality Date   BRONCHIAL BIOPSY  03/30/2022   Procedure: BRONCHIAL BIOPSIES;  Surgeon: Brenna Adine CROME, DO;  Location: [facility];  Service: Pulmonary;;   BRONCHIAL BIOPSY  10/17/2023   Procedure: BRONCHIAL BIOPSIES;  Surgeon: Shelah Lamar RAMAN, MD;  Location: [facility];  Service: Pulmonary;;   BRONCHIAL BRUSHINGS  03/30/2022   Procedure: BRONCHIAL BRUSHINGS;  Surgeon: Brenna Adine CROME, DO;  Location: [facility];  Service: Pulmonary;;   BRONCHIAL BRUSHINGS  10/17/2023   Procedure: BRONCHIAL BRUSHINGS;  Surgeon: Shelah Lamar RAMAN, MD;  Location: [facility] ENDOSCOPY;  Service: Pulmonary;;   BRONCHIAL NEEDLE ASPIRATION BIOPSY  03/30/2022   Procedure: BRONCHIAL NEEDLE ASPIRATION BIOPSIES;  Surgeon: Brenna Adine CROME, DO;  Location[facility];  Service: Pulmonary;;   BRONCHIAL NEEDLE ASPIRATION BIOPSY  10/17/2023   Procedure: BRONCHIAL NEEDLE ASPIRATION BIOPSIES;  Surgeon: Shelah Lamar RAMAN, MD;  [facility];  Service: Pulmonary;;   BRONCHIAL WASHINGS  05/19/2024   Procedure: IRRIGATION, BRONCHUS;   Surgeon: Jude Harden GAILS, MD;  Location[facility];  Service: Cardiopulmonary;;   EYE SURGERY     FIDUCIAL MARKER PLACEMENT  03/30/2022   Procedure: FIDUCIAL MARKER PLACEMENT;  Surgeon: Brenna Adine CROME, DO;  [facility];  Service: Pulmonary;;   FINE NEEDLE ASPIRATION  10/17/2023   Procedure: FINE NEEDLE ASPIRATION;  Surgeon: Shelah Lamar RAMAN, MD;  [facility];  Service: Pulmonary;;   FOREIGN BODY REMOVAL  03/30/2022   Procedure: FOREIGN BODY REMOVAL;  Surgeon: Brenna Adine CROME, DO;  [facility];  Service: Pulmonary;;   HEMOSTASIS CONTROL  10/17/2023   Procedure: HEMOSTASIS CONTROL;  Surgeon: Shelah Lamar RAMAN, MD;  [facility];  Service: Pulmonary;;   IR THORACENTESIS ASP PLEURAL SPACE W/IMG GUIDE  08/21/2024   IR THORACENTESIS ASP PLEURAL SPACE W/IMG GUIDE  08/30/2024   IR THORACENTESIS ASP PLEURAL SPACE W/IMG GUIDE  09/25/2024   IR THORACENTESIS ASP PLEURAL SPACE W/IMG GUIDE  11/02/2024   VIDEO BRONCHOSCOPY Bilateral 05/19/2024   Procedure: VIDEO BRONCHOSCOPY WITHOUT FLUORO;  Surgeon: Jude Harden GAILS, MD;  Location[facility];  Service: Cardiopulmonary;  Laterality: Bilateral;   VIDEO BRONCHOSCOPY WITH ENDOBRONCHIAL ULTRASOUND Bilateral 03/30/2022   Procedure: VIDEO BRONCHOSCOPY WITH ENDOBRONCHIAL ULTRASOUND;  Surgeon: Brenna Adine CROME, DO;  [facility];  Service: Pulmonary;  Laterality: Bilateral;   VIDEO BRONCHOSCOPY WITH ENDOBRONCHIAL ULTRASOUND Right 10/17/2023   Procedure: VIDEO BRONCHOSCOPY WITH ENDOBRONCHIAL ULTRASOUND;  Surgeon: Shelah Lamar RAMAN, MD;  Location: [facility] ENDOSCOPY;  Service: Pulmonary;  Laterality: Right;  VIDEO BRONCHOSCOPY WITH RADIAL ENDOBRONCHIAL ULTRASOUND  03/30/2022   Procedure: VIDEO BRONCHOSCOPY WITH RADIAL ENDOBRONCHIAL ULTRASOUND;  Surgeon: Brenna Adine CROME, DO;  [facility];  Service: Pulmonary;;    Family History  Problem Relation Age of Onset   Allergic rhinitis Sister    Asthma Sister    Angioedema Neg  Hx    Atopy Neg Hx    Immunodeficiency Neg Hx    Urticaria Neg Hx    Eczema Neg Hx     Social History   Socioeconomic History   Marital status: Married    Spouse name: Not on file   Number of children: Not on file   Years of education: Not on file   Highest education level: Not on file  Occupational History   Not on file  Tobacco Use   Smoking status: Former    Current packs/day: 0.00    Types: Cigarettes    Quit date: 2024    Years since quitting: 1.8    Passive exposure: Past   Smokeless tobacco: Never  Vaping Use   Vaping status: Never Used  Substance and Sexual Activity   Alcohol use: No   Drug use: Never   Sexual activity: Not Currently  Other Topics Concern   Not on file  Social History Narrative   Not on file   Social Drivers of Health   Financial Resource Strain: Low Risk  (04/24/2024)   Received from [facility]   Overall Financial Resource Strain (CARDIA)    Difficulty of Paying Living Expenses: Not very hard  Food Insecurity: No Food Insecurity (09/10/2024)   Hunger Vital Sign    Worried About Running Out of Food in the Last Year: Never true    Ran Out of Food in the Last Year: Never true  Transportation Needs: No Transportation Needs (09/10/2024)   PRAPARE - Administrator, Civil Service (Medical): No    Lack of Transportation (Non-Medical): No  Physical Activity: Inactive (04/24/2024)   Received from [facility]   Exercise Vital Sign    On average, how many days per week do you engage in moderate to strenuous exercise (like a brisk walk)?: 0 days    On average, how many minutes do you engage in exercise at this level?: 20 min  Stress: Stress Concern Present (04/24/2024)   Received from [facility] - Occupational Stress Questionnaire    Feeling of Stress : Rather much  Social Connections: Moderately Integrated (09/10/2024)   Social Connection and Isolation Panel    Frequency of Communication  with Friends and Family: More than three times a week    Frequency of Social Gatherings with Friends and Family: Never    Attends Religious Services: Never    Database Administrator or Organizations: Yes    Attends Banker Meetings: Never    Marital Status: Married  Catering Manager Violence: Not At Risk (09/10/2024)   Humiliation, Afraid, Rape, and Kick questionnaire    Fear of Current or Ex-Partner: No    Emotionally Abused: No    Physically Abused: No    Sexually Abused: No     Physical Exam   Vitals:   11/09/24 0404  BP: 131/82  Pulse: (!) 141  Resp: 20  Temp: (!) 96.7 F (35.9 C)  SpO2: 100%    CONSTITUTIONAL: Ill-appearing NEURO/PSYCH:  Alert and oriented x 3, no focal deficits EYES:  eyes equal and reactive ENT/NECK:  no LAD,  no JVD CARDIO: Tachycardic rate, well-perfused, normal S1 and S2 PULM: No wheezing, diminished breath sounds, tachypneic GI/GU:  non-distended, non-tender MSK/SPINE:  No gross deformities, no edema SKIN:  no rash, atraumatic   *Additional and/or pertinent findings included in MDM below  Diagnostic and Interventional Summary    EKG Interpretation Date/Time:    Ventricular Rate:    PR Interval:    QRS Duration:    QT Interval:    QTC Calculation:   R Axis:      Text Interpretation:         Labs Reviewed - No data to display  DG Chest Port 1 View  Final Result      Medications  fentaNYL  (SUBLIMAZE ) injection 50 mcg (has no administration in time range)  fentaNYL  (SUBLIMAZE ) injection 50 mcg (50 mcg Intravenous Given 11/09/24 0445)     Procedures  /  Critical Care .Critical Care  Performed by: Theadore Ozell HERO, MD Authorized by: Theadore Ozell HERO, MD   Critical care provider statement:    Critical care time (minutes):  35   Critical care was necessary to treat or prevent imminent or life-threatening deterioration of the following conditions:  Respiratory failure   Critical care was time spent personally by me on  the following activities:  Development of treatment plan with patient or surrogate, discussions with consultants, evaluation of patient's response to treatment, examination of patient, ordering and review of laboratory studies, ordering and review of radiographic studies, ordering and performing treatments and interventions, pulse oximetry, re-evaluation of patient's condition and review of old charts   ED Course and Medical Decision Making  Initial Impression and Ddx Concern for either acute pulmonary embolism or acute reaccumulation of pleural effusion, she says she was recently drained.  However patient is wanting to de-escalate and is interested in full comfort measures at this time.  She is fully alert and oriented, answering complex questions.  This is her decision.  We discussed how not doing tests or medications will lead to her death, possibly quickly.  She is ready for this.  Will have a discussion with family upon arrival.  Past medical/surgical history that increases complexity of ED encounter: Stage IV lung cancer  Interpretation of Diagnostics I personally reviewed the Chest Xray and my interpretation is as follows: Large right pleural effusion no pneumothorax, diffuse interstitial opacities    Patient Reassessment and Ultimate Disposition/Management     Further goals of care/end-of-life care discussions with family present.  Patient continues to wish for comfort measures, natural death from here on out.  Family agrees that it is her decision however they have some hesitancies, they are intermittently trying some persuasion techniques to see if patient will change her mind.  Patient was very clear with what she wanted with me prior to family arrival.  She has had a long fight.  I will check in on the situation frequently but at this time plan is for admission for comfort measures.  Perhaps palliative thoracentesis could be considered or offered to the patient.  5:45 AM update: Upon  reassessment patient continues to express desire for full comfort measures.  Admitted to hospitalist service.  Patient management required discussion with the following services or consulting groups:  Hospitalist Service  Complexity of Problems Addressed Acute illness or injury that poses threat of life of bodily function  Additional Data Reviewed and Analyzed Further history obtained from: Further history from spouse/family member  Additional Factors Impacting ED Encounter Risk Consideration of hospitalization  Ozell  EMERSON Poisson, MD [facility] Emergency Medicine [facility] [email or web address]  Final Clinical Impressions(s) / ED Diagnoses     ICD-10-CM   1. Acute respiratory failure with hypoxia (HCC)  J96.01     2. Pleural effusion  J90       ED Discharge Orders     None        Discharge Instructions Discussed with and Provided to Patient:   Discharge Instructions   None      Poisson Ozell HERO, MD 11/09/24 0454    Poisson Ozell HERO, MD 11/09/24 [phone]

## 2024-11-09 NOTE — ED Triage Notes (Signed)
 PT BIB [facility]. Per EMS PT has stage 4 lung cancer, PT complaining of fluid build up in her left lung. PT complaining of shob, right sided pain, last chemo was on 11/02/24 per family

## 2024-11-09 NOTE — Progress Notes (Signed)
 Patient admitted earlier this morning for worsening shortness of breath and has been placed on comfort measures as per patient request.  Palliative care team has been consulted.  Patient seen and examined at bedside.  Continue comfort measures.  Follow palliative care recommendations.  Continue oxygen supplementation.

## 2024-11-09 NOTE — Consult Note (Signed)
 Consultation Note Date: 11/09/2024   Patient Name: Lindsey Williamson  DOB: 1951-02-20  MRN: [id number]  Age / Sex: 73 y.o., female  PCP: [name], MD Referring Physician: Cheryle Page, MD  Reason for Consultation: Establishing goals of care  HPI/Patient Profile: 73 y.o. 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 Gatha Patient brought into the emergency department by patient and husband because of progressive shortness of breath Workup in the emergency room with chest x-ray showing large right-sided pleural effusion Patient stated to emergency department staff as well as to overnight admitting hospitalist that she wanted to be kept on comfort measures only, declined any aggressive measures, declined further tests or procedures or further cancer directed care Palliative consult for ongoing goals of care discussions has been requested Chart reviewed Patient seen and examined Patient appears visibly dyspneic, using accessory muscles of respiration, she is awake alert and oriented, knows she is in the hospital, she states that she has stage IV lung cancer.  Patient believes that she is at the end of her life, states that she believes she is transitioning.   NEXT OF KIN Husband and son  SUMMARY OF RECOMMENDATIONS   Goals of care discussions: Discussed with the patient about her current condition.  Goals wishes and values attempted to be explored.  Patient has a acute dyspnea and tachypnea evident, not able to speak in full sentences however is awake alert and oriented.  She refuses unloader thoracenteses, she asks  to be kept as comfortable as possible.  At present, she agrees with comfort measures but does not want to transfer to hospice facility.  She wishes to continue her current hospitalization. Call placed and was able to reach the patient's husband and the patient's son was also available on the call.  Discussed frankly but compassionately about patient's current condition and her current symptom burden as well as her wishes for aggressive measures and symptom management of pain and shortness of breath.  Both patient's husband and son state that they would like to respect the patient's wishes. Plan: DNR/DNI Comfort measures Low-dose continuous opioid infusion Ativan as needed for shortness of breath and anxiety Admit for comfort measures, anticipate in-hospital death. Thank you for the consult. Will request virtual care for additional support.  Code Status/Advance Care Planning: DNR   Symptom Management:   As above.  Palliative Prophylaxis:  Delirium Protocol  Additional Recommendations (Limitations, Scope, Preferences): Full Comfort Care  Psycho-social/Spiritual:  Desire for further Chaplaincy support:yes Additional Recommendations: Education on Hospice  Prognosis:  Hours - Days  Discharge Planning: Anticipated Hospital Death      Primary Diagnoses: Present on Admission:  Primary cancer of right lower lobe of lung (HCC)  Hypertension  COPD (chronic obstructive pulmonary disease) (HCC)  Acute on chronic respiratory  failure with hypoxia (HCC)  Type 2 diabetes mellitus with microalbuminuria (HCC)  Acute respiratory failure with hypoxia (HCC)   I have reviewed the medical record, interviewed the patient and family, and examined the patient. The following aspects are pertinent.  Past Medical History:  Diagnosis Date   Angioedema 10/17/2023   Arthritis    Diabetes mellitus without complication (HCC)    Hypertension    Social History   Socioeconomic History   Marital  status: Married    Spouse name: Not on file   Number of children: Not on file   Years of education: Not on file   Highest education level: Not on file  Occupational History   Not on file  Tobacco Use   Smoking status: Former    Current packs/day: 0.00    Types: Cigarettes    Quit date: 2024    Years since quitting: 1.8    Passive exposure: Past   Smokeless tobacco: Never  Vaping Use   Vaping status: Never Used  Substance and Sexual Activity   Alcohol use: No   Drug use: Never   Sexual activity: Not Currently  Other Topics Concern   Not on file  Social History Narrative   Not on file   Social Drivers of Health   Financial Resource Strain: Low Risk  (04/24/2024)   Received from [facility]   Overall Financial Resource Strain (CARDIA)    Difficulty of Paying Living Expenses: Not very hard  Food Insecurity: No Food Insecurity (09/10/2024)   Hunger Vital Sign    Worried About Running Out of Food in the Last Year: Never true    Ran Out of Food in the Last Year: Never true  Transportation Needs: No Transportation Needs (09/10/2024)   PRAPARE - Administrator, Civil Service (Medical): No    Lack of Transportation (Non-Medical): No  Physical Activity: Inactive (04/24/2024)   Received from [facility]   Exercise Vital Sign    On average, how many days per week do you engage in moderate to strenuous exercise (like a brisk walk)?: 0 days    On average, how many minutes do you engage in exercise at this level?: 20 min  Stress: Stress Concern Present (04/24/2024)   Received from [facility] - Occupational Stress Questionnaire    Feeling of Stress : Rather much  Social Connections: Moderately Integrated (09/10/2024)   Social Connection and Isolation Panel    Frequency of Communication with Friends and Family: More than three times a week    Frequency of Social Gatherings with Friends and Family: Never    Attends Religious  Services: Never    Database Administrator or Organizations: Yes    Attends Banker Meetings: Never    Marital Status: Married   Family History  Problem Relation Age of Onset   Allergic rhinitis Sister    Asthma Sister    Angioedema Neg Hx    Atopy Neg Hx    Immunodeficiency Neg Hx    Urticaria Neg Hx    Eczema Neg Hx    Scheduled Meds:  bisoprolol   5 mg Oral Daily   diltiazem   360 mg Oral Daily   insulin  aspart  0-6 Units Subcutaneous TID WC   umeclidinium-vilanterol  1 puff Inhalation Daily   Continuous Infusions: PRN Meds:.acetaminophen  **OR** acetaminophen , fentaNYL  (SUBLIMAZE ) injection, ondansetron  (ZOFRAN ) IV Medications Prior to Admission:  Prior to Admission medications   Medication Sig Start  Date End Date Taking? Authorizing Provider  albuterol  (VENTOLIN  HFA) 108 (90 Base) MCG/ACT inhaler Inhale 2 puffs into the lungs every 6 (six) hours as needed for wheezing or shortness of breath. 08/07/24  Yes Olalere, Adewale A, MD  bisoprolol  (ZEBETA ) 5 MG tablet Take 5 mg by mouth daily. 10/06/24  Yes [provider]  diltiazem  (CARDIZEM  CD) 360 MG 24 hr capsule Take 1 capsule (360 mg total) by mouth daily. 08/24/24 08/24/25 Yes Patsy Lenis, MD  empagliflozin  (JARDIANCE ) 25 MG TABS tablet Take 25 mg by mouth daily at 6 PM. 02/08/22  Yes [provider]  glipiZIDE  (GLUCOTROL  XL) 5 MG 24 hr tablet Take 1 tablet (5 mg total) by mouth daily with breakfast. 04/11/24  Yes Amin, Ankit C, MD  ipratropium-albuterol  (DUONEB) 0.5-2.5 (3) MG/3ML SOLN Take 3 mLs by nebulization in the morning and at bedtime. 05/14/24  Yes Ruthell Lauraine FALCON, NP  metFORMIN  (GLUCOPHAGE -XR) 500 MG 24 hr tablet Take 500 mg by mouth 2 (two) times daily with a meal. 06/24/20  Yes [provider]  traMADol (ULTRAM) 50 MG tablet Take 1 tablet (50 mg total) by mouth every 6 (six) hours as needed. 11/01/24  Yes Pickenpack-Cousar, Fannie SAILOR, NP  umeclidinium-vilanterol (ANORO ELLIPTA ) 62.5-25  MCG/ACT AEPB Inhale 1 puff into the lungs daily. 05/09/24  Yes Ruthell Lauraine FALCON, NP  aspirin  EC 81 MG tablet Take 81 mg by mouth daily. Swallow whole.    [provider]  bisoprolol  (ZEBETA ) 10 MG tablet Take 1 tablet (10 mg total) by mouth daily. 08/24/24   Patsy Lenis, MD  cetirizine  (ZYRTEC  ALLERGY ) 10 MG tablet Take 1 tablet (10 mg total) by mouth daily. 03/08/24   Lorin Norris, MD  ferrous sulfate  325 (65 FE) MG tablet Take 325 mg by mouth daily with breakfast.    [provider]  guaiFENesin -dextromethorphan  (ROBITUSSIN DM) 100-10 MG/5ML syrup Take 5 mLs by mouth every 4 (four) hours as needed for cough. 10/05/24   Olalere, Jennet LABOR, MD  HYDROcodone  bit-homatropine (HYDROMET) 5-1.5 MG/5ML syrup Take 5 mLs by mouth every 6 (six) hours as needed for cough. 10/30/24   Neda Jennet LABOR, MD  insulin  aspart (NOVOLOG ) 100 UNIT/ML FlexPen Inject 0-11 Units into the skin 3 (three) times daily with meals. Check Blood Glucose (BG) and inject per scale: BG <150= 0 unit; BG 150-200= 1 unit; BG 201-250= 3 unit; BG 251-300= 5 unit; BG 301-350= 7 unit; BG 351-400= 9 unit; BG >400= 11 unit and Call Primary Care. Patient not taking: Reported on 11/09/2024 04/11/24   Caleen Burgess BROCKS, MD  metFORMIN  (GLUCOPHAGE ) 1000 MG tablet Take 1 tablet (1,000 mg total) by mouth 2 (two) times daily with a meal. Patient not taking: Reported on 11/09/2024 09/14/24   Jillian Buttery, MD  nystatin  (MYCOSTATIN ) 100000 UNIT/ML suspension Take 5 mLs (500,000 Units total) by mouth 4 (four) times daily. 10/29/24   Heilingoetter, Cassandra L, PA-C  nystatin  (MYCOSTATIN /NYSTOP ) powder Apply 1 Application topically 3 (three) times daily. 09/24/24   Heilingoetter, Cassandra L, PA-C  polyethylene glycol (MIRALAX  / GLYCOLAX ) 17 g packet Take 17 g packet by mouth daily. 02/19/24   Adhikari, Amrit, MD  potassium chloride  SA (KLOR-CON  M) 20 MEQ tablet Take 1 tablet (20 mEq total) by mouth daily. 10/23/24   Heilingoetter, Cassandra  L, PA-C  prochlorperazine  (COMPAZINE ) 10 MG tablet Take 1 tablet (10 mg total) by mouth every 6 (six) hours as needed. Patient taking differently: Take 10 mg by mouth every 6 (six) hours as  needed for nausea or vomiting. 07/17/24   Heilingoetter, Cassandra L, PA-C  senna-docusate (SENOKOT-S) 8.6-50 MG tablet Take 1 tablet by mouth 2 (two) times daily. 09/14/24   Jillian Buttery, MD   Allergies  Allergen Reactions   Benzonatate Palpitations   Lisinopril Swelling and Other (See Comments)    Patient ended up in the ED with a swollen mouth and tongue   Aspirin  Nausea And Vomiting, Palpitations and Other (See Comments)    Can tolerate baby aspirin  81 mg without difficulty, but can't tolerate higher dosages   Azithromycin  Palpitations   Codeine  Palpitations and Other (See Comments)    Patient states this is not an allergy    Review of Systems Shortness of breath Physical Exam Elderly appearing lady Awake alert Not able to speak in full sentences due to acute degree of shortness of breath Using accessory muscles of respiration Patient is awake alert oriented to time place and person Trace peripheral edema Abdomen is not distended Patient appears visibly dyspneic and moderate to severe distress  Vital Signs: BP 129/77   Pulse (!) 143   Temp 97.6 F (36.4 C)   Resp (!) 23   SpO2 96%  Pain Scale: 0-10   Pain Score: 10-Worst pain ever   SpO2: SpO2: 96 % O2 Device:SpO2: 96 % O2 Flow Rate: .O2 Flow Rate (L/min): 3 L/min  IO: Intake/output summary: No intake or output data in the 24 hours ending 11/09/24 0910  LBM:   Baseline Weight:   Most recent weight:       Palliative Assessment/Data:   PPS 30%  Time In:  8 Time Out: 9.15  Time Total:  75 Greater than 50%  of this time was spent counseling and coordinating care related to the above assessment and plan.  Signed by: Lonia Serve, MD   Please contact Palliative Medicine Team phone at [phone] for questions and concerns.   For individual provider: See Tracey

## 2024-11-09 NOTE — Progress Notes (Signed)
 [name]   DOB:[birth year]   FM#:[id number]      ASSESSMENT & PLAN:  Lindsey Williamson is a 73 year old female patient with oncologic history significant for NSCLC. Admitted today 11/09/24 for progressive shortness of breath.  Medical Oncology following.  Progressive shortness of breath and dyspnea.  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 Appears lethargic at this time. -- Continue supportive care and comfort measures.   Non-small cell lung cancer, Stage IIIa (T3, N2, M0) -- Initially diagnosed 03/30/2022 -- Patient is status post concurrent chemotherapy and RT.  Discontinued immunotherapy after cycle 1 due to concern for pneumonitis and was placed on dose reduced Carbo/taxol /keytruda .  -- Subsequently switched to gemcitabine with continued poor tolerance.   -- Patient requested at outpatient oncology visit on 10/29/24 that she was tired and contemplating hospice.  -- Seen by Palliative team, appreciate input and assistance.  -- Has been made DNR-comfort care. -- Medical Oncology/Dr. Sherrod has been managing.  Anemia -- Last HGB low 9.2 -- No need to continue labs as patient now DNR-comfort care    Code Status DNR-Comfort care  Subjective:  Patient seen lethargic and verbally non-responsive in ED.  Opens her eyes to verbal stimuli however no verbal response.  She did nod her head to me.  Appears to be short of breath and has O2 via George intact.    Objective:  No intake or output data in the 24 hours ending 11/09/24 0937   PHYSICAL EXAMINATION: ECOG PERFORMANCE STATUS: 4 - Bedbound  Vitals:   11/09/24 0742 11/09/24 0808  BP: 129/77   Pulse: (!) 143   Resp: (!) 23   Temp:  97.6 F (36.4 C)  SpO2: 96%    There were no vitals filed for this visit.  GENERAL: +lethargic +moderate respiratory distress with dyspnea SKIN: +pale skin color, texture, turgor are normal, no rashes or significant  lesions EYES: normal, conjunctiva are pink and non-injected, sclera clear OROPHARYNX: no exudate, no erythema and lips, buccal mucosa, and tongue normal  NECK: supple, thyroid  normal size, non-tender, without nodularity LYMPH: no palpable lymphadenopathy in the cervical, axillary or inguinal LUNGS:  +obvious use of accessory muscles HEART: regular rate & rhythm and no murmurs and no lower extremity edema ABDOMEN: abdomen soft, non-tender and normal bowel sounds MUSCULOSKELETAL: no cyanosis of digits and no clubbing  PSYCH: +lethargic NEURO: no focal motor/sensory deficits   All questions were answered. The patient knows to call the clinic with any problems, questions or concerns.   The total time spent in the appointment was 40 minutes encounter with patient including review of chart and various tests results, discussions about plan of care and coordination of care plan  Olam JINNY Brunner, NP 11/09/2024 9:37 AM    Labs Reviewed:  Lab Results  Component Value Date   WBC 6.4 10/29/2024   HGB 9.2 (L) 10/29/2024   HCT 30.7 (L) 10/29/2024   MCV 81.0 10/29/2024   PLT 457 (H) 10/29/2024   Recent Labs    09/10/24 1550 09/11/24 0532 10/15/24 1101 10/23/24 1328 10/29/24 1432  NA 132*   < > 134* 133* 132*  K 4.1   < > 3.5 3.2* 4.0  CL 94*   < > 99 97* 98  CO2 29   < > 24 26 17*  GLUCOSE 193*   < > 186* 153* 189*  BUN 20   < > 9 6* 5*  CREATININE  0.53   < > 0.44 0.41* 0.50  CALCIUM  12.2*   < > 10.9* 11.0* 9.0  GFRNONAA >60   < > >60 >60 >60  PROT 7.1   < > 7.6 7.7 7.8  ALBUMIN 3.5   < > 3.3* 3.3* 3.2*  AST 11*   < > <10* <10* 16  ALT <5   < > <5 <5 <5  ALKPHOS 68   < > 70 66 85  BILITOT 0.3   < > 0.3 0.3 0.3  BILIDIR 0.2  --   --   --   --   IBILI 0.2*  --   --   --   --    < > = values in this interval not displayed.    Studies Reviewed:  DG Chest Port 1 View Result Date: 11/09/2024 EXAM: 1 VIEW(S) XRAY OF THE CHEST 11/09/2024 04:29:00 AM COMPARISON: 11/02/2024 CLINICAL  HISTORY: SOB (shortness of breath) FINDINGS: LUNGS AND PLEURA: Large right pleural effusion, increased from prior. Diffuse interstitial opacities. Right basilar airspace opacity. No pneumothorax. HEART AND MEDIASTINUM: Cardiomegaly. Aortic atherosclerosis. BONES AND SOFT TISSUES: No acute osseous abnormality. IMPRESSION: 1. Large right pleural effusion, increased from prior. 2. Diffuse interstitial opacities and right basilar airspace opacity. 3. Cardiomegaly and aortic atherosclerosis. Electronically signed by: Evalene Coho MD 11/09/2024 04:33 AM EST RP Workstation: [id number]   IR THORACENTESIS ASP PLEURAL SPACE W/IMG GUIDE Result Date: 11/02/2024 INDICATION: Patient with history of recurrent right malignant effusion and here for thoracentesis. Last thoracentesis 09/25/24 (700 ml). EXAM: ULTRASOUND GUIDED RIGHT THORACENTESIS MEDICATIONS: 8 mL 1% lidocaine  with epi COMPLICATIONS: None immediate. PROCEDURE: An ultrasound guided thoracentesis was thoroughly discussed with the patient and questions answered. The benefits, risks, alternatives and complications were also discussed. The patient understands and wishes to proceed with the procedure. Written consent was obtained. Ultrasound was performed to localize and mark an adequate pocket of fluid in the right chest. The area was then prepped and draped in the normal sterile fashion. 1% Lidocaine  was used for local anesthesia. Under ultrasound guidance a 6 Fr Safe-T-Centesis catheter was introduced. Thoracentesis was performed. The catheter was removed and a dressing applied. FINDINGS: A total of approximately 650 milliliters of clear, yellow fluid was removed. IMPRESSION: Successful ultrasound guided right therapeutic thoracentesis yielding 650 milliliters of pleural fluid. Performed by Laymon Coast, NP and supervised by Dr. Philip. Electronically Signed   By: Juliene Philip M.D.   On: 11/02/2024 17:17   DG Chest 1 View Result Date: 11/02/2024 EXAM: 1 VIEW(S)  XRAY OF THE CHEST 11/02/2024 03:13:00 PM COMPARISON: Prior study 10/29/2024. CLINICAL HISTORY: Pleural effusion. FINDINGS: LUNGS AND PLEURA: Shallow inspiration. Diffuse perihilar infiltrates demonstrating mild progression since prior study. Moderate right and small left pleural effusions with basilar atelectasis. This is similar. No pneumothorax. No pulmonary edema. HEART AND MEDIASTINUM: Heart size is obscured by the parenchyma and pleural process but appears enlarged. Mediastinal contours appear intact. Calcification of the aorta. BONES AND SOFT TISSUES: Degenerative changes in the spine and shoulders. IMPRESSION: 1. Mild progression of diffuse perihilar infiltrates since prior study. 2. Moderate right and small left pleural effusions with basilar atelectasis, similar to prior study. Electronically signed by: Elsie Gravely MD 11/02/2024 03:40 PM EST RP Workstation: [id number]   DG Chest 2 View Result Date: 10/31/2024 EXAM: 2 VIEW(S) XRAY OF THE CHEST 10/29/2024 05:00:00 PM COMPARISON: 10/05/2024 CLINICAL HISTORY: Assess malignant effusion to see if thora is needed FINDINGS: LUNGS AND PLEURA: Large right pleural effusion with associated passive atelectasis and  increase in airspace opacity in the right mid lung obscuring the right heart border. Underlying pneumonia not excluded although some of this appearance is due to known right perihilar mass. Low lung volumes. Hazy interstitial accentuation similar to prior, cannot exclude mild interstitial edema. Retrocardiac opacity on the left compatible with known left lower lobe mass. No pulmonary edema. No pneumothorax. HEART AND MEDIASTINUM: Although the right heart border is obscured, cardiomegaly is expected. Atheromatous vascular calcification of the aortic arch. BONES AND SOFT TISSUES: No acute osseous abnormality. IMPRESSION: 1. Large right pleural effusion with associated passive atelectasis and increased right mid lung opacity; superimposed pneumonia cannot  be excluded, with known right perihilar mass contributes to the appearance. 2. Retrocardiac left lower lobe opacity compatible with known mass. 3. Hazy interstitial accentuation similar to prior, which could reflect mild interstitial edema. 4. Cardiomegaly is expected despite obscured right heart border. 5. Aortic arch atherosclerotic calcification. Electronically signed by: Ryan Salvage MD 10/31/2024 04:34 PM EST RP Workstation: [id number]

## 2024-11-09 NOTE — Progress Notes (Signed)
 Spiritual care is following for support needs.  I spent time with Lindsey Williamson this morning and sat with her while she was waiting for some pain medication to begin giving her relief.  We remain available and will check in as we are able, but please also page as needs arise.

## 2024-11-12 ENCOUNTER — Inpatient Hospital Stay

## 2024-11-13 ENCOUNTER — Inpatient Hospital Stay

## 2024-11-13 ENCOUNTER — Other Ambulatory Visit (HOSPITAL_COMMUNITY): Payer: Self-pay

## 2024-11-13 ENCOUNTER — Inpatient Hospital Stay: Admitting: Internal Medicine

## 2024-11-20 ENCOUNTER — Inpatient Hospital Stay

## 2024-11-20 ENCOUNTER — Inpatient Hospital Stay: Admitting: Dietician

## 2024-11-20 ENCOUNTER — Inpatient Hospital Stay: Admitting: Physician Assistant

## 2024-11-26 NOTE — Progress Notes (Addendum)
 Updated patient's spouse Teghan Philbin on patient's agonal breathing and increased heart rate. Spouse understands patient may be close to transitioning. Spouse asked to be called if patient transitions while he is not here. Asked spouse if we should notify anyone else, Darcella Shiffman stated No. No plans to come to the hospital at this hour.

## 2024-11-26 NOTE — Progress Notes (Signed)
 Dilaudid gtt 20 ml wasted in the steri cycle with Jacquetta Bastos RN, [employer].

## 2024-11-26 NOTE — Progress Notes (Signed)
 Daily Progress Note   Patient Name: Lindsey Williamson       Date: 12-10-2024 DOB: 1951-07-06  Age: 73 y.o. MRN#: [id number] Attending Physician: Cheryle Page, MD Primary Care Physician: [name], MD Admit Date: 11/09/2024  Reason for Consultation/Follow-up: Terminal Care  Subjective: Unresponsive Comfortable Sighing respirations  Length of Stay: 1  Current Medications: Scheduled Meds:   bisoprolol   5 mg Oral Daily   diltiazem   360 mg Oral Daily   umeclidinium-vilanterol  1 puff Inhalation Daily    Continuous Infusions:  HYDROmorphone 0.5 mg/hr (12-10-2024 0330)    PRN Meds: acetaminophen  **OR** acetaminophen , HYDROmorphone **AND** HYDROmorphone (DILAUDID) injection, LORazepam, ondansetron  (ZOFRAN ) IV  Physical Exam         Unresponsive overall comfortable Shallow apneic spells Trace edema  Vital Signs: BP (!) 109/58 (BP Location: Left Arm)   Pulse (!) 136   Temp (!) 97.4 F (36.3 C) (Oral)   Resp 10   SpO2 93%  SpO2: SpO2: 93 % O2 Device: O2 Device: Nasal Cannula O2 Flow Rate: O2 Flow Rate (L/min): 3 L/min  Intake/output summary:  Intake/Output Summary (Last 24 hours) at Dec 10, 2024 0953 Last data filed at 12-10-24 0330 Gross per 24 hour  Intake 8.24 ml  Output --  Net 8.24 ml   LBM:   Baseline Weight:   Most recent weight:         Palliative Assessment/Data:      Patient Active Problem List   Diagnosis Date Noted   Acute respiratory failure with hypoxia (HCC) 11/09/2024   Decreased appetite 10/29/2024   Hypercalcemia of malignancy 10/23/2024   Weakness 09/11/2024   FTT (failure to thrive) in adult 09/10/2024   Hypercalcemia 09/10/2024   Hyponatremia 09/10/2024   Normocytic anemia 09/10/2024   Malignant pleural effusion (HCC)  09/10/2024   Pleural effusion 08/22/2024   COPD exacerbation (HCC) 08/16/2024   Hypomagnesemia 08/16/2024   Prolonged QT interval 08/16/2024   Ectopic atrial tachycardia 08/16/2024   Pneumonitis 04/06/2024   Acute on chronic respiratory failure with hypoxia (HCC) 04/06/2024   ILD (interstitial lung disease) (HCC) 04/06/2024   COPD (chronic obstructive pulmonary disease) (HCC) 04/06/2024   Hypoxia 02/13/2024   Encounter for antineoplastic immunotherapy 02/09/2024   Encounter for antineoplastic chemotherapy 11/15/2023   Goals of care, counseling/discussion 10/25/2023   Angioedema 10/17/2023  Bleeding per rectum 07/30/2022   Diverticular disease of colon 07/30/2022   First degree hemorrhoids 07/30/2022   Hyperglycemia due to type 2 diabetes mellitus (HCC) 07/30/2022   Primary cancer of right lower lobe of lung (HCC) 04/08/2022   Lung nodule 03/22/2022   Dizziness 05/19/2020   Allergic rhinitis with a predominant nonallergic component. 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 Gatha Patient brought into the emergency department by patient and husband because of progressive shortness of  breath Workup in the emergency room with chest x-ray showing large right-sided pleural effusion Patient stated to emergency department staff as well as to overnight admitting hospitalist that she wanted to be kept on comfort measures only, declined any aggressive measures, declined further tests or procedures or further cancer directed care Palliative follow up for comfort care, end of life care.   Recommendations/Plan:  DNR DNI comfort measures Dilaudid drip Anticipate in hospital death Prognosis likely limited to a few hours in my opinion Comfort cart when family arrives at bedside.  Appreciate chaplain assistance on 11-09-24.  PPS 20%  Goals of Care and Additional Recommendations: Limitations on Scope of Treatment: Full Comfort Care  Code Status:    Code Status Orders  (From admission, onward)           Start     Ordered   11/09/24 0918  Do not attempt resuscitation (DNR) - Comfort care  (Code Status)  Continuous       Question Answer Comment  If patient has no pulse and is not breathing Do Not Attempt Resuscitation   In Pre-Arrest Conditions (Patient Is Breathing and Has a Pulse) Provide comfort measures. Relieve any mechanical airway obstruction. Avoid transfer unless required for comfort.   Consent: Discussion documented in EHR or advanced directives reviewed      11/09/24 0918           Code Status History     Date Active Date Inactive Code Status Order ID Comments User Context   11/09/2024 0559 11/09/2024 0918 Limited: Do not attempt resuscitation (DNR) -DNR-LIMITED -Do Not Intubate/DNI  [id number]  Franky Redia SAILOR, MD ED   09/10/2024 1801 09/14/2024 2322 Limited: Do not attempt resuscitation (DNR) -DNR-LIMITED -Do Not Intubate/DNI  [id number]  Verdene Purchase, MD ED   08/23/2024 1541 08/25/2024 2100 Limited: Do not attempt resuscitation (DNR) -DNR-LIMITED -Do Not Intubate/DNI  [id number]  Marvis Camellia LABOR, NP Inpatient   08/16/2024 2016 08/23/2024 1541 Full Code  [id number]  Silvester Ales, MD ED   05/19/2024 0919 05/20/2024 1542 Full Code [id number]  Jude Harden GAILS, MD Inpatient   05/18/2024 1256 05/19/2024 0918 Do not attempt resuscitation (DNR) PRE-ARREST INTERVENTIONS DESIRED [id number]  Jonel Lonni SQUIBB, MD Inpatient   05/18/2024 1104 05/18/2024 1256 Full Code [id number]  Danford,  Lonni SQUIBB, MD ED   04/06/2024 2304 04/11/2024 1907 Do not attempt resuscitation (DNR) PRE-ARREST INTERVENTIONS DESIRED [id number]  Keturah Carrier, MD ED   04/06/2024 2205 04/06/2024 2304 Full Code [id number]  Keturah Carrier, MD ED   02/13/2024 1940 02/18/2024 1846 Full Code [id number]  Dena Charleston, MD ED       Prognosis:  Hours - Days  Discharge Planning: Anticipated Hospital Death  Care plan was discussed with  IDT   Thank you for allowing the Palliative Medicine Team to assist in the care of this patient.  Mod MDM.      Greater than 50%  of this time was spent counseling and coordinating care related to the above assessment and plan.  Lonia Serve, MD  Please contact Palliative Medicine Team phone at [phone] for questions and concerns.

## 2024-11-26 NOTE — Death Summary Note (Signed)
 Death Summary  Lindsey Williamson FMW:[id number] DOB: 03/04/1951 DOA: November 20, 2024  PCP: [name], MD  Admit date: November 20, 2024 Date of Death: 11/21/24 Time of Death: 12:40pm   History of present illness:  73 y.o. female with history of recurrent non-small cell lung cancer with progression and malignant right pleural effusion who had recent thoracentesis on November 02, 2024 and also on chemo and immunotherapy presented with worsening shortness of breath.  On presentation, she was found to have large right-sided pleural effusion.  Patient did not want any thoracentesis and wanted to proceed with comfort measures.  She was placed on comfort measures.  She passed away on 21-Nov-2024 at 12:40 PM.  Final Diagnoses:   Comfort measures only status Acute on chronic respite failure with hypoxia Recurrent malignant pleural effusion Non-small cell lung cancer COPD Metastatic History of atrial tachycardia Anemia of chronic disease   The results of significant diagnostics from this hospitalization (including imaging, microbiology, ancillary and laboratory) are listed below for reference.    Significant Diagnostic Studies: DG Chest Port 1 View Result Date: 11-20-2024 EXAM: 1 VIEW(S) XRAY OF THE CHEST 20-Nov-2024 04:29:00 AM COMPARISON: 11/02/2024 CLINICAL HISTORY: SOB (shortness of breath) FINDINGS: LUNGS AND PLEURA: Large right pleural effusion, increased from prior. Diffuse interstitial opacities. Right basilar airspace opacity. No pneumothorax. HEART AND MEDIASTINUM: Cardiomegaly. Aortic atherosclerosis. BONES AND SOFT TISSUES: No acute osseous abnormality. IMPRESSION: 1. Large right pleural effusion, increased from prior. 2. Diffuse interstitial opacities and right basilar airspace opacity. 3. Cardiomegaly and aortic atherosclerosis. Electronically signed by: Evalene Coho MD 11-20-2024 04:33 AM EST RP Workstation: [id number]   IR THORACENTESIS ASP PLEURAL SPACE W/IMG GUIDE Result Date:  11/02/2024 INDICATION: Patient with history of recurrent right malignant effusion and here for thoracentesis. Last thoracentesis 09/25/24 (700 ml). EXAM: ULTRASOUND GUIDED RIGHT THORACENTESIS MEDICATIONS: 8 mL 1% lidocaine  with epi COMPLICATIONS: None immediate. PROCEDURE: An ultrasound guided thoracentesis was thoroughly discussed with the patient and questions answered. The benefits, risks, alternatives and complications were also discussed. The patient understands and wishes to proceed with the procedure. Written consent was obtained. Ultrasound was performed to localize and mark an adequate pocket of fluid in the right chest. The area was then prepped and draped in the normal sterile fashion. 1% Lidocaine  was used for local anesthesia. Under ultrasound guidance a 6 Fr Safe-T-Centesis catheter was introduced. Thoracentesis was performed. The catheter was removed and a dressing applied. FINDINGS: A total of approximately 650 milliliters of clear, yellow fluid was removed. IMPRESSION: Successful ultrasound guided right therapeutic thoracentesis yielding 650 milliliters of pleural fluid. Performed by Laymon Coast, NP and supervised by Dr. Philip. Electronically Signed   By: Juliene Philip M.D.   On: 11/02/2024 17:17   DG Chest 1 View Result Date: 11/02/2024 EXAM: 1 VIEW(S) XRAY OF THE CHEST 11/02/2024 03:13:00 PM COMPARISON: Prior study 10/29/2024. CLINICAL HISTORY: Pleural effusion. FINDINGS: LUNGS AND PLEURA: Shallow inspiration. Diffuse perihilar infiltrates demonstrating mild progression since prior study. Moderate right and small left pleural effusions with basilar atelectasis. This is similar. No pneumothorax. No pulmonary edema. HEART AND MEDIASTINUM: Heart size is obscured by the parenchyma and pleural process but appears enlarged. Mediastinal contours appear intact. Calcification of the aorta. BONES AND SOFT TISSUES: Degenerative changes in the spine and shoulders. IMPRESSION: 1. Mild progression of  diffuse perihilar infiltrates since prior study. 2. Moderate right and small left pleural effusions with basilar atelectasis, similar to prior study. Electronically signed by: Elsie Gravely MD 11/02/2024 03:40 PM EST RP Workstation: [id number]   DG  Chest 2 View Result Date: 10/31/2024 EXAM: 2 VIEW(S) XRAY OF THE CHEST 10/29/2024 05:00:00 PM COMPARISON: 10/05/2024 CLINICAL HISTORY: Assess malignant effusion to see if thora is needed FINDINGS: LUNGS AND PLEURA: Large right pleural effusion with associated passive atelectasis and increase in airspace opacity in the right mid lung obscuring the right heart border. Underlying pneumonia not excluded although some of this appearance is due to known right perihilar mass. Low lung volumes. Hazy interstitial accentuation similar to prior, cannot exclude mild interstitial edema. Retrocardiac opacity on the left compatible with known left lower lobe mass. No pulmonary edema. No pneumothorax. HEART AND MEDIASTINUM: Although the right heart border is obscured, cardiomegaly is expected. Atheromatous vascular calcification of the aortic arch. BONES AND SOFT TISSUES: No acute osseous abnormality. IMPRESSION: 1. Large right pleural effusion with associated passive atelectasis and increased right mid lung opacity; superimposed pneumonia cannot be excluded, with known right perihilar mass contributes to the appearance. 2. Retrocardiac left lower lobe opacity compatible with known mass. 3. Hazy interstitial accentuation similar to prior, which could reflect mild interstitial edema. 4. Cardiomegaly is expected despite obscured right heart border. 5. Aortic arch atherosclerotic calcification. Electronically signed by: Ryan Salvage MD 10/31/2024 04:34 PM EST RP Workstation: [id number]    Microbiology: No results found for this or any previous visit (from the past 240 hours).   Labs: Basic Metabolic Panel: No results for input(s): NA, K, CL, CO2, GLUCOSE, BUN,  CREATININE, CALCIUM , MG, PHOS in the last 168 hours. Liver Function Tests: No results for input(s): AST, ALT, ALKPHOS, BILITOT, PROT, ALBUMIN in the last 168 hours. No results for input(s): LIPASE, AMYLASE in the last 168 hours. No results for input(s): AMMONIA in the last 168 hours. CBC: No results for input(s): WBC, NEUTROABS, HGB, HCT, MCV, PLT in the last 168 hours. Cardiac Enzymes: No results for input(s): CKTOTAL, CKMB, CKMBINDEX, TROPONINI in the last 168 hours. D-Dimer No results for input(s): DDIMER in the last 72 hours. BNP: Invalid input(s): POCBNP CBG: Recent Labs  Lab 11/09/24 0833  GLUCAP 301*   Anemia work up No results for input(s): VITAMINB12, FOLATE, FERRITIN, TIBC, IRON, RETICCTPCT in the last 72 hours. Urinalysis    Component Value Date/Time   COLORURINE YELLOW 09/10/2024 2348   APPEARANCEUR CLEAR 09/10/2024 2348   LABSPEC 1.027 09/10/2024 2348   PHURINE 5.0 09/10/2024 2348   GLUCOSEU >=500 (A) 09/10/2024 2348   HGBUR NEGATIVE 09/10/2024 2348   BILIRUBINUR NEGATIVE 09/10/2024 2348   KETONESUR 5 (A) 09/10/2024 2348   PROTEINUR 30 (A) 09/10/2024 2348   NITRITE NEGATIVE 09/10/2024 2348   LEUKOCYTESUR LARGE (A) 09/10/2024 2348   Sepsis Labs No results for input(s): WBC in the last 168 hours.  Invalid input(s): PROCALCITONIN, LACTICIDVEN     SIGNED:  Sophie Mao, MD  [facility] 2024-12-05, 1:59 PM

## 2024-11-26 DEATH — deceased

## 2024-12-03 ENCOUNTER — Inpatient Hospital Stay
